# Patient Record
Sex: Male | Born: 1945 | Race: White | Hispanic: No | Marital: Married | Smoking: Never smoker
Health system: Southern US, Community
[De-identification: ages and names within clinical notes are randomized; demographics above are authoritative.]

## PROBLEM LIST (undated history)

## (undated) DIAGNOSIS — K5730 Diverticulosis of large intestine without perforation or abscess without bleeding: Secondary | ICD-10-CM

## (undated) DIAGNOSIS — C8590 Non-Hodgkin lymphoma, unspecified, unspecified site: Secondary | ICD-10-CM

## (undated) DIAGNOSIS — I499 Cardiac arrhythmia, unspecified: Secondary | ICD-10-CM

## (undated) DIAGNOSIS — R0602 Shortness of breath: Secondary | ICD-10-CM

## (undated) DIAGNOSIS — I517 Cardiomegaly: Secondary | ICD-10-CM

## (undated) DIAGNOSIS — Z87442 Personal history of urinary calculi: Secondary | ICD-10-CM

## (undated) DIAGNOSIS — M1990 Unspecified osteoarthritis, unspecified site: Secondary | ICD-10-CM

## (undated) DIAGNOSIS — K219 Gastro-esophageal reflux disease without esophagitis: Secondary | ICD-10-CM

## (undated) DIAGNOSIS — N183 Chronic kidney disease, stage 3 unspecified: Secondary | ICD-10-CM

## (undated) DIAGNOSIS — I509 Heart failure, unspecified: Secondary | ICD-10-CM

## (undated) DIAGNOSIS — K644 Residual hemorrhoidal skin tags: Secondary | ICD-10-CM

## (undated) DIAGNOSIS — E119 Type 2 diabetes mellitus without complications: Secondary | ICD-10-CM

## (undated) DIAGNOSIS — C801 Malignant (primary) neoplasm, unspecified: Secondary | ICD-10-CM

## (undated) DIAGNOSIS — T7840XA Allergy, unspecified, initial encounter: Secondary | ICD-10-CM

## (undated) DIAGNOSIS — T4145XA Adverse effect of unspecified anesthetic, initial encounter: Secondary | ICD-10-CM

## (undated) DIAGNOSIS — J40 Bronchitis, not specified as acute or chronic: Secondary | ICD-10-CM

## (undated) DIAGNOSIS — T8859XA Other complications of anesthesia, initial encounter: Secondary | ICD-10-CM

## (undated) DIAGNOSIS — D689 Coagulation defect, unspecified: Secondary | ICD-10-CM

## (undated) DIAGNOSIS — I4891 Unspecified atrial fibrillation: Secondary | ICD-10-CM

## (undated) DIAGNOSIS — R319 Hematuria, unspecified: Secondary | ICD-10-CM

## (undated) DIAGNOSIS — I10 Essential (primary) hypertension: Secondary | ICD-10-CM

## (undated) DIAGNOSIS — Z86010 Personal history of colonic polyps: Secondary | ICD-10-CM

## (undated) DIAGNOSIS — Z8719 Personal history of other diseases of the digestive system: Secondary | ICD-10-CM

## (undated) HISTORY — PX: HERNIA REPAIR: SHX51

## (undated) HISTORY — PX: COSMETIC SURGERY: SHX468

## (undated) HISTORY — DX: Type 2 diabetes mellitus without complications: E11.9

## (undated) HISTORY — PX: CLEFT PALATE REPAIR: SUR1165

## (undated) HISTORY — PX: CARDIOVERSION: SHX1299

## (undated) HISTORY — PX: LYMPH NODE BIOPSY: SHX201

## (undated) HISTORY — PX: CLEFT LIP REPAIR: SHX5315

## (undated) HISTORY — PX: CHOLECYSTECTOMY: SHX55

## (undated) HISTORY — DX: Allergy, unspecified, initial encounter: T78.40XA

## (undated) HISTORY — PX: OTHER SURGICAL HISTORY: SHX169

## (undated) HISTORY — DX: Coagulation defect, unspecified: D68.9

---

## 2005-08-06 ENCOUNTER — Ambulatory Visit (HOSPITAL_COMMUNITY): Admission: RE | Admit: 2005-08-06 | Discharge: 2005-08-06 | Payer: Self-pay | Admitting: Pulmonary Disease

## 2009-12-22 ENCOUNTER — Ambulatory Visit (HOSPITAL_COMMUNITY): Admission: RE | Admit: 2009-12-22 | Discharge: 2009-12-22 | Payer: Self-pay | Admitting: General Surgery

## 2010-09-24 ENCOUNTER — Ambulatory Visit (HOSPITAL_COMMUNITY)
Admission: RE | Admit: 2010-09-24 | Discharge: 2010-09-24 | Payer: Self-pay | Source: Home / Self Care | Attending: Pulmonary Disease | Admitting: Pulmonary Disease

## 2010-10-27 ENCOUNTER — Ambulatory Visit (INDEPENDENT_AMBULATORY_CARE_PROVIDER_SITE_OTHER): Payer: Managed Care, Other (non HMO) | Admitting: Internal Medicine

## 2010-10-27 DIAGNOSIS — K7689 Other specified diseases of liver: Secondary | ICD-10-CM

## 2010-10-27 DIAGNOSIS — R7401 Elevation of levels of liver transaminase levels: Secondary | ICD-10-CM

## 2010-11-10 NOTE — Consult Note (Signed)
Darius Smith, Darius Smith                 ACCOUNT NO.:  [id number]  MEDICAL RECORD NO.:  [id number]           PATIENT TYPE:  LOCATION:                                 FACILITY:  PHYSICIAN:  Lionel December, M.D.    DATE OF BIRTH:  Nov 27, 1945  DATE OF CONSULTATION: DATE OF DISCHARGE:                                CONSULTATION   REASON FOR CONSULTATION:  Elevated transaminases, fatty liver.  HISTORY OF PRESENT ILLNESS:  Mr. Washko is a referral from Dr. Juanetta Gosling for elevated transaminases and a fatty liver.  He was seen at Dr. Juanetta Gosling' office in December 2011, and noted his ALP was 225, his AST was 211, ALT was 635.  His total bilirubin was 1.5.  His hepatitis B surface antigen was negative.  His hepatitis B core antibody, IgM was negative. His hepatitis A antibody IgM was negative and hepatitis C antibody was negative.  Mr. Sutcliffe denies any abdominal pain.  His appetite is good. He has had no weight loss.  He has a bowel movement every day.  They are normal.  He denies any recent or past jaundice.  He has not been out of country recently.  He does not have any tattoos.  He does give a history of being on Augmentin for sinus infection and was stopped and he stopped the medication 1 week before his blood work was drawn because of age. He states he took approximately 10 Augmentin tablets.  He denies any appetite changes, no melena, or rectal bleeding.  There is no known allergies.  SURGERIES:  He has had a recent right inguinal hernia repair in April 2011.  He has had a repair of a cleft palate.  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  MEDICAL HISTORY:  Includes hypertension.  FAMILY HISTORY:  His mother is alive with breast cancer, terminal, and his father is deceased from prostate cancer.  He has 1 sister with COPD, three brothers, one just recently had an aortic aneurysm repair and two brothers are in good  health.  SOCIAL HISTORY:  He works at [employer].  He does not smoke, drink, or do drugs.  He has 2 children in good health.  HOME MEDICATIONS: 1. Amlodipine/benazepril 5/21 a day. 2. Omeprazole 20 mg a day. 3. Zyrtec 10 mg as needed.  OBJECTIVE:  VITAL SIGNS:  Blood pressure is 190/84, his weight is 197.5, pulse is 80, temp is 97.1. HEENT:  Conjunctivae pink.  His sclerae anicteric. NECK:  His thyroid is normal.  There is no cervical lymphadenopathy. LUNGS:  Clear. HEART:  Regular rate and rhythm. ABDOMEN:  Obese, soft.  Bowel sounds are positive.  No masses felt.  On his labs, his hepatitis A, B, and C markers were negative. Hemoglobin 15.2, hematocrit was 45.7.  He did have a 9 eosinophils, absolute eosinophils of 0.8.  Glucose 107.  Total bilirubin was 1.5, ALP 225, AST 211, ALT 635.  His PSA was 1.41.  ASSESSMENT:  Mr. Stille is a 65 year old male presenting with elevated transaminases and a  probable fatty infiltration of the liver on ultrasound which was done in January 2012.  This increase in his transaminases could be related to the Augmentin he was taken.  I did discuss this case with Dr. Karilyn Cota.  RECOMMENDATIONS:  We will repeat a hepatic function panel only.  We will get a ferritin, iron, and IBC, and a sed rate only.  Further recommendations once we have these reports back and I did discuss this case with Dr. Karilyn Cota.    ______________________________ Dorene Ar, NP   ______________________________ Lionel December, M.D.    TS/MEDQ  D:  10/27/2010  T:  10/28/2010  Job:  [id number]  cc:   Ramon Dredge L. Juanetta Gosling, M.D. Fax: [phone]  Electronically Signed by Dorene Ar PA on 11/09/2010 09:07:41 AM Electronically Signed by Lionel December M.D. on 11/10/2010 02:12:08 PM

## 2010-12-09 LAB — CBC
HCT: 40 % (ref 39.0–52.0)
Hemoglobin: 14.1 g/dL (ref 13.0–17.0)
MCHC: 35.1 g/dL (ref 30.0–36.0)
MCV: 87 fL (ref 78.0–100.0)
Platelets: 272 10*3/uL (ref 150–400)
RBC: 4.6 MIL/uL (ref 4.22–5.81)
RDW: 13 % (ref 11.5–15.5)
WBC: 8.5 10*3/uL (ref 4.0–10.5)

## 2010-12-09 LAB — BASIC METABOLIC PANEL
BUN: 16 mg/dL (ref 6–23)
CO2: 26 mEq/L (ref 19–32)
Calcium: 9 mg/dL (ref 8.4–10.5)
Chloride: 108 mEq/L (ref 96–112)
Creatinine, Ser: 1.03 mg/dL (ref 0.4–1.5)
GFR calc Af Amer: >60 mL/min (ref >60)
GFR calc non Af Amer: >60 mL/min (ref >60)
Glucose, Bld: 126 mg/dL — ABNORMAL HIGH (ref 70–99)
Potassium: 3.6 mEq/L (ref 3.5–5.1)
Sodium: 141 mEq/L (ref 135–145)

## 2011-04-22 ENCOUNTER — Telehealth (INDEPENDENT_AMBULATORY_CARE_PROVIDER_SITE_OTHER): Payer: Self-pay | Admitting: *Deleted

## 2011-04-22 DIAGNOSIS — R945 Abnormal results of liver function studies: Secondary | ICD-10-CM

## 2011-04-22 DIAGNOSIS — R7401 Elevation of levels of liver transaminase levels: Secondary | ICD-10-CM

## 2011-04-22 NOTE — Telephone Encounter (Signed)
Lab order faxed and letter sent to the patient..  

## 2011-04-28 ENCOUNTER — Ambulatory Visit (HOSPITAL_COMMUNITY)
Admission: RE | Admit: 2011-04-28 | Discharge: 2011-04-28 | Disposition: A | Discharge status: Home / Self Care | Payer: Managed Care, Other (non HMO) | Source: Ambulatory Visit | Attending: Internal Medicine | Admitting: Internal Medicine

## 2011-04-28 ENCOUNTER — Other Ambulatory Visit (INDEPENDENT_AMBULATORY_CARE_PROVIDER_SITE_OTHER): Payer: Self-pay | Admitting: Internal Medicine

## 2011-04-28 ENCOUNTER — Encounter (HOSPITAL_COMMUNITY)
Admission: RE | Disposition: A | Discharge status: Home / Self Care | Payer: Self-pay | Source: Ambulatory Visit | Attending: Internal Medicine

## 2011-04-28 ENCOUNTER — Encounter (INDEPENDENT_AMBULATORY_CARE_PROVIDER_SITE_OTHER): Payer: Managed Care, Other (non HMO) | Admitting: Internal Medicine

## 2011-04-28 ENCOUNTER — Encounter (HOSPITAL_COMMUNITY): Payer: Self-pay | Admitting: *Deleted

## 2011-04-28 DIAGNOSIS — D128 Benign neoplasm of rectum: Secondary | ICD-10-CM | POA: Insufficient documentation

## 2011-04-28 DIAGNOSIS — Z1211 Encounter for screening for malignant neoplasm of colon: Secondary | ICD-10-CM | POA: Insufficient documentation

## 2011-04-28 DIAGNOSIS — Z79899 Other long term (current) drug therapy: Secondary | ICD-10-CM | POA: Insufficient documentation

## 2011-04-28 DIAGNOSIS — K5730 Diverticulosis of large intestine without perforation or abscess without bleeding: Secondary | ICD-10-CM

## 2011-04-28 DIAGNOSIS — D126 Benign neoplasm of colon, unspecified: Secondary | ICD-10-CM | POA: Insufficient documentation

## 2011-04-28 DIAGNOSIS — I10 Essential (primary) hypertension: Secondary | ICD-10-CM | POA: Insufficient documentation

## 2011-04-28 HISTORY — PX: COLONOSCOPY: SHX5424

## 2011-04-28 HISTORY — DX: Essential (primary) hypertension: I10

## 2011-04-28 HISTORY — DX: Unspecified osteoarthritis, unspecified site: M19.90

## 2011-04-28 HISTORY — DX: Shortness of breath: R06.02

## 2011-04-28 HISTORY — DX: Gastro-esophageal reflux disease without esophagitis: K21.9

## 2011-04-28 SURGERY — COLONOSCOPY
Anesthesia: Moderate Sedation

## 2011-04-28 MED ORDER — SODIUM CHLORIDE 0.45 % IV SOLN
Freq: Once | INTRAVENOUS | Status: AC
  Administered 2011-04-28: 08:00:00 via INTRAVENOUS

## 2011-04-28 MED ORDER — MEPERIDINE HCL 50 MG/ML IJ SOLN
INTRAMUSCULAR | Status: AC
  Filled 2011-04-28: qty 1

## 2011-04-28 MED ORDER — STERILE WATER FOR IRRIGATION IR SOLN
Status: DC | PRN
  Administered 2011-04-28: 09:00:00

## 2011-04-28 MED ORDER — MIDAZOLAM HCL 5 MG/5ML IJ SOLN
INTRAMUSCULAR | Status: DC | PRN
  Administered 2011-04-28: 1 mg via INTRAVENOUS
  Administered 2011-04-28 (×2): 2 mg via INTRAVENOUS

## 2011-04-28 MED ORDER — MEPERIDINE HCL 50 MG/ML IJ SOLN
INTRAMUSCULAR | Status: DC | PRN
  Administered 2011-04-28 (×2): 25 mg via INTRAVENOUS

## 2011-04-28 MED ORDER — MIDAZOLAM HCL 5 MG/5ML IJ SOLN
INTRAMUSCULAR | Status: AC
  Filled 2011-04-28: qty 10

## 2011-04-28 NOTE — Op Note (Signed)
COLONOSCOPY PROCEDURE REPORT  PATIENT:  Darius Smith  MR#:  [id number] Birthdate:  1946-09-01, 65 y.o., male Endoscopist:  Dr. Malissa Hippo, MD Referred By:  Dr. Fredirick Maudlin. Procedure Date: 04/28/2011  Procedure:   Colonoscopy.  Indications:  Average risk screening colonoscopy.  Informed Consent: Procedure and risks were reviewed with the patient; his questions were answered and informed consent was obtained.   Medications:  Demerol 50  mg IV Versed 5 mg IV  Description of procedure:  After a digital rectal exam was performed, that colonoscope was advanced from the anus through the rectum and colon to the area of the cecum, ileocecal valve and appendiceal orifice. The cecum was deeply intubated. These structures were well-seen and photographed for the record. From the level of the cecum and ileocecal valve, the scope was slowly and cautiously withdrawn. The mucosal surfaces were carefully surveyed utilizing scope tip to flexion to facilitate fold flattening as needed. The scope was pulled down into the rectum where a thorough exam including retroflexion was performed.  Findings:   Preparation is satisfactory. 12 mm sessile polyp at ileocecal valve with irregular shape saline-assisted polypectomy performed residual polyp coagulated using snare tip. Polypectomy complete. 6 mm sessile polyp transverse colon partly snared and partly correlated saline injection. 10 mm broad-based made from mid sigmoid colon. Millimeter polyp snared from the rectum 3 small polyps at sigmoid colon were coagulated using snare tip. Multiple diverticula at sigmoid colon and few at descending.  Therapeutic/Diagnostic Maneuvers Performed:  See findings.  Complications:  None  Cecal Withdrawal Time:  32 minutes  Impression:  4 polyps snared as above. three polyps coagulated as above. Multiple diverticula sigmoid colon and few at descending colon   Recommendations:  No aspirin for 10 days Resume  usual medications High fiber diet No driving for 24 hours. Physician will contact you with biopsy result.  Anzal Bartnick U  04/28/2011 9:16 AM  CC: Dr. Fredirick Maudlin, MD

## 2011-04-28 NOTE — Brief Op Note (Signed)
bp 121/81 on arrival to post op.

## 2011-04-28 NOTE — H&P (Signed)
Darius Smith is an 65 y.o. male.   Chief Complaint: For colonoscopy HPI: Patient is 65 year old Caucasian male who is here for average risk screening colonoscopy. This is his first exam. He denies abdominal pain change in his bowel habits or rectal bleeding.  Past Medical History  Diagnosis Date  . Shortness of breath   . Hypertension   . GERD (gastroesophageal reflux disease)   . Arthritis     Past Surgical History  Procedure Date  . Hernia repair   . Cholecystectomy   . Vocal cord surgery     History reviewed. No pertinent family history. Social History:  reports that he has never smoked. He does not have any smokeless tobacco history on file. He reports that he drinks about 4.2 ounces of alcohol per week. He reports that he does not use illicit drugs.  Allergies:  Allergies  Allergen Reactions  . Augmentin Other (See Comments)    Increased liver enzymes    Medications Prior to Admission  Medication Dose Route Frequency Provider Last Rate Last Dose  . 0.45 % sodium chloride infusion   Intravenous Once Malissa Hippo, MD 20 mL/hr at 04/28/11 0805    . meperidine (DEMEROL) 50 MG/ML injection           . midazolam (VERSED) 5 MG/5ML injection            Medications Prior to Admission  Medication Sig Dispense Refill  . amLODipine-benazepril (LOTREL) 5-20 MG per capsule Take 1 capsule by mouth daily.        Marland Kitchen omeprazole (PRILOSEC) 20 MG capsule Take 20 mg by mouth daily.          No results found for this or any previous visit (from the past 48 hour(s)). No results found.  Review of Systems  Constitutional: Negative for weight loss.  Gastrointestinal: Negative for heartburn, nausea, vomiting, abdominal pain, diarrhea, constipation, blood in stool and melena.    Blood pressure 185/109, pulse 73, temperature 98.1 F (36.7 C), temperature source Oral, resp. rate 18, height 6' (1.829 m), weight 194 lb (87.998 kg), SpO2 98.00%. Physical Exam  Constitutional: He appears  well-developed and well-nourished.  HENT:  Mouth/Throat: Oropharynx is clear and moist.       Large tonsils  Eyes: Conjunctivae are normal. No scleral icterus.  Neck: Neck supple. No thyromegaly present.  Cardiovascular: Normal rate, regular rhythm and normal heart sounds.   No murmur heard. Respiratory: Breath sounds normal.  GI: Soft. He exhibits no mass. There is no tenderness. There is no rebound and no guarding.  Musculoskeletal: He exhibits no edema.  Lymphadenopathy:    He has no cervical adenopathy.  Neurological: He is alert.  Skin: Skin is warm and dry.     Assessment/Plan Average risk screening colonoscopy. Procedure and risks  reviewed and he  is agreeable  Linnell Swords U 04/28/2011, 8:27 AM

## 2011-04-30 LAB — HEPATIC FUNCTION PANEL
ALT: 23 U/L (ref 0–53)
AST: 18 U/L (ref 0–37)
Albumin: 4.1 g/dL (ref 3.5–5.2)
Alkaline Phosphatase: 77 U/L (ref 39–117)
Bilirubin, Direct: 0.2 mg/dL (ref 0.0–0.3)
Indirect Bilirubin: 0.6 mg/dL (ref 0.0–0.9)
Total Bilirubin: 0.8 mg/dL (ref 0.3–1.2)
Total Protein: 7 g/dL (ref 6.0–8.3)

## 2011-05-03 ENCOUNTER — Telehealth (INDEPENDENT_AMBULATORY_CARE_PROVIDER_SITE_OTHER): Payer: Self-pay | Admitting: Internal Medicine

## 2011-05-03 NOTE — Telephone Encounter (Signed)
Results given to wife. All lab work was normal.

## 2011-05-04 ENCOUNTER — Encounter (INDEPENDENT_AMBULATORY_CARE_PROVIDER_SITE_OTHER): Payer: Self-pay | Admitting: *Deleted

## 2011-05-05 ENCOUNTER — Encounter (HOSPITAL_COMMUNITY): Payer: Self-pay | Admitting: Internal Medicine

## 2014-04-24 ENCOUNTER — Encounter (INDEPENDENT_AMBULATORY_CARE_PROVIDER_SITE_OTHER): Payer: Self-pay | Admitting: *Deleted

## 2014-05-08 ENCOUNTER — Other Ambulatory Visit (INDEPENDENT_AMBULATORY_CARE_PROVIDER_SITE_OTHER): Payer: Self-pay | Admitting: *Deleted

## 2014-05-08 DIAGNOSIS — Z86010 Personal history of colonic polyps: Secondary | ICD-10-CM

## 2014-05-22 ENCOUNTER — Telehealth (INDEPENDENT_AMBULATORY_CARE_PROVIDER_SITE_OTHER): Payer: Self-pay | Admitting: *Deleted

## 2014-05-22 DIAGNOSIS — Z1211 Encounter for screening for malignant neoplasm of colon: Secondary | ICD-10-CM

## 2014-05-22 NOTE — Telephone Encounter (Signed)
Patient needs trilyte 

## 2014-05-24 MED ORDER — PEG 3350-KCL-NA BICARB-NACL 420 G PO SOLR: 4000.0000 mL | Freq: Once | ORAL | Status: DC

## 2014-06-19 ENCOUNTER — Telehealth (INDEPENDENT_AMBULATORY_CARE_PROVIDER_SITE_OTHER): Payer: Self-pay | Admitting: *Deleted

## 2014-06-19 NOTE — Telephone Encounter (Signed)
Referring MD/PCP: hawkins   Procedure: tcs  Reason/Indication:  Hx polyps  Has patient had this procedure before?  Yes, 2012 -- epic  If so, when, by whom and where?    Is there a family history of colon cancer?  no  Who?  What age when diagnosed?    Is patient diabetic?   no      Does patient have prosthetic heart valve?  no  Do you have a pacemaker?  no  Has patient ever had endocarditis? no  Has patient had joint replacement within last 12 months?  no  Does patient tend to be constipated or take laxatives? no  Is patient on Coumadin, Plavix and/or Aspirin? no  Medications: omeprazole 20 mg daily, amlodipine 5/40 mg daily, centrum silver  Allergies: augmentin  Medication Adjustment:   Procedure date & time: 07/18/14 at 830

## 2014-06-24 NOTE — Telephone Encounter (Signed)
agree

## 2014-07-01 ENCOUNTER — Encounter (HOSPITAL_COMMUNITY): Payer: Self-pay | Admitting: Pharmacy Technician

## 2014-07-18 ENCOUNTER — Encounter (HOSPITAL_COMMUNITY): Payer: Self-pay | Admitting: *Deleted

## 2014-07-18 ENCOUNTER — Encounter (HOSPITAL_COMMUNITY)
Admission: RE | Disposition: A | Discharge status: Home / Self Care | Payer: Self-pay | Source: Ambulatory Visit | Attending: Internal Medicine

## 2014-07-18 ENCOUNTER — Ambulatory Visit (HOSPITAL_COMMUNITY)
Admission: RE | Admit: 2014-07-18 | Discharge: 2014-07-18 | Disposition: A | Discharge status: Home / Self Care | Payer: Medicare Other | Source: Ambulatory Visit | Attending: Internal Medicine | Admitting: Internal Medicine

## 2014-07-18 DIAGNOSIS — D120 Benign neoplasm of cecum: Secondary | ICD-10-CM | POA: Diagnosis not present

## 2014-07-18 DIAGNOSIS — Z1211 Encounter for screening for malignant neoplasm of colon: Secondary | ICD-10-CM | POA: Insufficient documentation

## 2014-07-18 DIAGNOSIS — Z86010 Personal history of colonic polyps: Secondary | ICD-10-CM

## 2014-07-18 DIAGNOSIS — K649 Unspecified hemorrhoids: Secondary | ICD-10-CM

## 2014-07-18 DIAGNOSIS — I10 Essential (primary) hypertension: Secondary | ICD-10-CM | POA: Diagnosis not present

## 2014-07-18 DIAGNOSIS — K5730 Diverticulosis of large intestine without perforation or abscess without bleeding: Secondary | ICD-10-CM | POA: Diagnosis not present

## 2014-07-18 DIAGNOSIS — Z79899 Other long term (current) drug therapy: Secondary | ICD-10-CM | POA: Insufficient documentation

## 2014-07-18 DIAGNOSIS — D123 Benign neoplasm of transverse colon: Secondary | ICD-10-CM | POA: Insufficient documentation

## 2014-07-18 DIAGNOSIS — K219 Gastro-esophageal reflux disease without esophagitis: Secondary | ICD-10-CM | POA: Diagnosis not present

## 2014-07-18 DIAGNOSIS — K644 Residual hemorrhoidal skin tags: Secondary | ICD-10-CM | POA: Insufficient documentation

## 2014-07-18 DIAGNOSIS — D122 Benign neoplasm of ascending colon: Secondary | ICD-10-CM | POA: Insufficient documentation

## 2014-07-18 HISTORY — PX: COLONOSCOPY: SHX5424

## 2014-07-18 SURGERY — COLONOSCOPY
Anesthesia: Moderate Sedation

## 2014-07-18 MED ORDER — SODIUM CHLORIDE 0.9 % IV SOLN
INTRAVENOUS | Status: DC
  Administered 2014-07-18: 1000 mL via INTRAVENOUS

## 2014-07-18 MED ORDER — STERILE WATER FOR IRRIGATION IR SOLN
Status: DC | PRN
  Administered 2014-07-18: 08:00:00

## 2014-07-18 MED ORDER — MIDAZOLAM HCL 5 MG/5ML IJ SOLN
INTRAMUSCULAR | Status: AC
  Filled 2014-07-18: qty 10

## 2014-07-18 MED ORDER — MEPERIDINE HCL 50 MG/ML IJ SOLN
INTRAMUSCULAR | Status: DC | PRN
  Administered 2014-07-18 (×2): 25 mg via INTRAVENOUS

## 2014-07-18 MED ORDER — MEPERIDINE HCL 50 MG/ML IJ SOLN
INTRAMUSCULAR | Status: AC
  Filled 2014-07-18: qty 1

## 2014-07-18 MED ORDER — MIDAZOLAM HCL 5 MG/5ML IJ SOLN
INTRAMUSCULAR | Status: DC | PRN
  Administered 2014-07-18: 1 mg via INTRAVENOUS
  Administered 2014-07-18 (×2): 2 mg via INTRAVENOUS

## 2014-07-18 NOTE — Op Note (Signed)
COLONOSCOPY PROCEDURE REPORT  PATIENT:  Darius Smith  MR#:  [id number] Birthdate:  [birth year], 68 y.o., male Endoscopist:  Dr. Rogene Houston, MD Referred By:  Dr. Alonza Bogus, MD  Procedure Date: 07/18/2014  Procedure:   Colonoscopy  Indications:  Patient is 68 year old Caucasian male with history of colonic adenomas who is here for surveillance colonoscopy. Last exam was in August 2012.  Informed Consent:  The procedure and risks were reviewed with the patient and informed consent was obtained.  Medications:  Demerol 50 mg IV Versed 5 mg IV  Description of procedure:  After a digital rectal exam was performed, that colonoscope was advanced from the anus through the rectum and colon to the area of the cecum, ileocecal valve and appendiceal orifice. The cecum was deeply intubated. These structures were well-seen and photographed for the record. From the level of the cecum and ileocecal valve, the scope was slowly and cautiously withdrawn. The mucosal surfaces were carefully surveyed utilizing scope tip to flexion to facilitate fold flattening as needed. The scope was pulled down into the rectum where a thorough exam including retroflexion was performed.  Findings: Prep satisfactory. 3 small polyps were ablated while cold biopsy and submitted together. These are located at cecum, ascending and proximal transverse colon. 5 mm polyp was cold snared from distal transverse colon. 7 mm polyp was hot snared from distal transverse colon. Both polyps from distal transverse colon were submitted together. Multiple diverticula and sigmoid colon with few more at descending colon. Normal rectal mucosa. Prominent hemorrhoids below the dentate line.  Therapeutic/Diagnostic Maneuvers Performed:  See above  Complications:  None  Cecal Withdrawal Time:  23 minutes  Impression:  Examination performed to cecum. Three small polyps or ablated via cold biopsy and submitted together(cecum,  ascending colon and proximal transverse colon). 5 mm polyp was cold snared from distal transverse colon. 7 mm polyp was hot snare from distal transverse colon and submitted with the other polyp removed from distal transverse colon. Left-sided diverticulosis. External hemorrhoids  Recommendations:  Standard instructions given. No aspirin or NSAIDs for 1 week. I will contact patient with biopsy results and further recommendations.  [name]  07/18/2014 8:59 AM  CC: Dr. Alonza Bogus, MD & Dr. Rayne Du ref. provider found

## 2014-07-18 NOTE — H&P (Addendum)
Darius Smith is an 68 y.o. male.   Chief Complaint: Patient is here for colonoscopy. HPI: Patient is 68 year old Caucasian male was here for surveillance colonoscopy. He has history of colonic adenomas. 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 One polyp was 12 mm. He denies abdominal pain change in bowel habits or rectal bleeding. Family history is negative for CRC.     Past Medical History  Diagnosis Date  . Shortness of breath   . Hypertension   . GERD (gastroesophageal reflux disease)   . Arthritis     Past Surgical History  Procedure Laterality Date  . Hernia repair    . Cholecystectomy    . Vocal cord surgery    . Colonoscopy  04/28/2011    Procedure: COLONOSCOPY;  Surgeon: Rogene Houston, MD;  Location: [facility];  Service: Endoscopy;  Laterality: N/A;    History reviewed. No pertinent family history. Social History:  reports that he has never smoked. He does not have any smokeless tobacco history on file. He reports that he drinks about 4.2 ounces of alcohol per week. He reports that he does not use illicit drugs.  Allergies:  Allergies  Allergen Reactions  . Amoxicillin-Pot Clavulanate Other (See Comments)    Increased liver enzymes    Medications Prior to Admission  Medication Sig Dispense Refill  . amLODipine-benazepril (LOTREL) 5-20 MG per capsule Take 1 capsule by mouth daily.        Marland Kitchen omeprazole (PRILOSEC) 20 MG capsule Take 20 mg by mouth daily.        . polyethylene glycol-electrolytes (TRILYTE) 420 G solution Take 4,000 mLs by mouth once.  4000 mL  0    No results found for this or any previous visit (from the past 48 hour(s)). No results found.  ROS  Blood pressure 170/97, pulse 94, temperature 97.7 F (36.5 C), temperature source Oral, resp. rate 18, height 6' (1.829 m), weight 195 lb (88.451 kg), SpO2 95.00%. Physical Exam  Constitutional: He appears well-developed and well-nourished.  HENT:   Mouth/Throat: Oropharynx is clear and moist.  Eyes: Conjunctivae are normal. No scleral icterus.  Neck: No thyromegaly present.  Cardiovascular: Normal rate, regular rhythm and normal heart sounds.   No murmur heard. Respiratory: Effort normal and breath sounds normal.  GI: Soft. He exhibits no distension and no mass. There is no tenderness.  Small umbilical hernia which is completely reducible.  Musculoskeletal: He exhibits no edema.  Lymphadenopathy:    He has no cervical adenopathy.  Neurological: He is alert.  Skin: Skin is warm and dry.     Assessment/Plan History of colonic adenomas. Surveillance colonoscopy.   Verdis Koval U 07/18/2014, 8:18 AM

## 2014-07-18 NOTE — Discharge Instructions (Signed)
No aspirin or NSAIDs for 1 week. Resume usual medications. High fiber diet. No driving for 24 hours. Physician will call with biopsy results.  Colonoscopy, Care After Refer to this sheet in the next few weeks. These instructions provide you with information on caring for yourself after your procedure. Your health care provider may also give you more specific instructions. Your treatment has been planned according to current medical practices, but problems sometimes occur. Call your health care provider if you have any problems or questions after your procedure. WHAT TO EXPECT AFTER THE PROCEDURE  After your procedure, it is typical to have the following:  A small amount of blood in your stool.  Moderate amounts of gas and mild abdominal cramping or bloating. HOME CARE INSTRUCTIONS  Do not drive, operate machinery, or sign important documents for 24 hours.  You may shower and resume your regular physical activities, but move at a slower pace for the first 24 hours.  Take frequent rest periods for the first 24 hours.  Walk around or put a warm pack on your abdomen to help reduce abdominal cramping and bloating.  Drink enough fluids to keep your urine clear or pale yellow.  You may resume your normal diet as instructed by your health care provider. Avoid heavy or fried foods that are hard to digest.  Avoid drinking alcohol for 24 hours or as instructed by your health care provider.  Only take over-the-counter or prescription medicines as directed by your health care provider.  If a tissue sample (biopsy) was taken during your procedure:  Do not take aspirin or blood thinners for 7 days, or as instructed by your health care provider.  Do not drink alcohol for 7 days, or as instructed by your health care provider.  Eat soft foods for the first 24 hours. SEEK MEDICAL CARE IF: You have persistent spotting of blood in your stool 2-3 days after the procedure. SEEK IMMEDIATE MEDICAL  CARE IF:  You have more than a small spotting of blood in your stool.  You pass large blood clots in your stool.  Your abdomen is swollen (distended).  You have nausea or vomiting.  You have a fever.  You have increasing abdominal pain that is not relieved with medicine. Document Released: 04/20/2004 Document Revised: 06/27/2013 Document Reviewed: 05/14/2013 [facility] Patient Information 2015 [place], [place]. This information is not intended to replace advice given to you by your health care provider. Make sure you discuss any questions you have with your health care provider.  High-Fiber Diet Fiber is found in fruits, vegetables, and grains. A high-fiber diet encourages the addition of more whole grains, legumes, fruits, and vegetables in your diet. The recommended amount of fiber for adult males is 38 g per day. For adult females, it is 25 g per day. Pregnant and lactating women should get 28 g of fiber per day. If you have a digestive or bowel problem, ask your caregiver for advice before adding high-fiber foods to your diet. Eat a variety of high-fiber foods instead of only a select few type of foods.  PURPOSE  To increase stool bulk.  To make bowel movements more regular to prevent constipation.  To lower cholesterol.  To prevent overeating. WHEN IS THIS DIET USED?  It may be used if you have constipation and hemorrhoids.  It may be used if you have uncomplicated diverticulosis (intestine condition) and irritable bowel syndrome.  It may be used if you need help with weight management.  It may  be used if you want to add it to your diet as a protective measure against atherosclerosis, diabetes, and cancer. SOURCES OF FIBER  Whole-grain breads and cereals.  Fruits, such as apples, oranges, bananas, berries, prunes, and pears.  Vegetables, such as green peas, carrots, sweet potatoes, beets, broccoli, cabbage, spinach, and artichokes.  Legumes, such split peas, soy,  lentils.  Almonds. FIBER CONTENT IN FOODS Starches and Grains / Dietary Fiber (g)  Cheerios, 1 cup / 3 g  Corn Flakes cereal, 1 cup / 0.7 g  Rice crispy treat cereal, 1 cup / 0.3 g  Instant oatmeal (cooked),  cup / 2 g  Frosted wheat cereal, 1 cup / 5.1 g  Brown, long-grain rice (cooked), 1 cup / 3.5 g  White, long-grain rice (cooked), 1 cup / 0.6 g  Enriched macaroni (cooked), 1 cup / 2.5 g Legumes / Dietary Fiber (g)  Baked beans (canned, plain, or vegetarian),  cup / 5.2 g  Kidney beans (canned),  cup / 6.8 g  Pinto beans (cooked),  cup / 5.5 g Breads and Crackers / Dietary Fiber (g)  Plain or honey graham crackers, 2 squares / 0.7 g  Saltine crackers, 3 squares / 0.3 g  Plain, salted pretzels, 10 pieces / 1.8 g  Whole-wheat bread, 1 slice / 1.9 g  White bread, 1 slice / 0.7 g  Raisin bread, 1 slice / 1.2 g  Plain bagel, 3 oz / 2 g  Flour tortilla, 1 oz / 0.9 g  Corn tortilla, 1 small / 1.5 g  Hamburger or hotdog bun, 1 small / 0.9 g Fruits / Dietary Fiber (g)  Apple with skin, 1 medium / 4.4 g  Sweetened applesauce,  cup / 1.5 g  Banana,  medium / 1.5 g  Grapes, 10 grapes / 0.4 g  Orange, 1 small / 2.3 g  Raisin, 1.5 oz / 1.6 g  Melon, 1 cup / 1.4 g Vegetables / Dietary Fiber (g)  Green beans (canned),  cup / 1.3 g  Carrots (cooked),  cup / 2.3 g  Broccoli (cooked),  cup / 2.8 g  Peas (cooked),  cup / 4.4 g  Mashed potatoes,  cup / 1.6 g  Lettuce, 1 cup / 0.5 g  Corn (canned),  cup / 1.6 g  Tomato,  cup / 1.1 g Document Released: 09/06/2005 Document Revised: 03/07/2012 Document Reviewed: 12/09/2011 [employer] Patient Information 2015 [place], [place]. This information is not intended to replace advice given to you by your health care provider. Make sure you discuss any questions you have with your health care provider.   Colon Polyps Polyps are lumps of extra tissue growing inside the body. Polyps can grow in the  large intestine (colon). Most colon polyps are noncancerous (benign). However, some colon polyps can become cancerous over time. Polyps that are larger than a pea may be harmful. To be safe, caregivers remove and test all polyps. CAUSES  Polyps form when mutations in the genes cause your cells to grow and divide even though no more tissue is needed. RISK FACTORS There are a number of risk factors that can increase your chances of getting colon polyps. They include:  Being older than 50 years.  Family history of colon polyps or colon cancer.  Long-term colon diseases, such as colitis or Crohn disease.  Being overweight.  Smoking.  Being inactive.  Drinking too much alcohol. SYMPTOMS  Most small polyps do not cause symptoms. If symptoms are present, they may include:  Blood in the stool. The stool may look dark red or black.  Constipation or diarrhea that lasts longer than 1 week. DIAGNOSIS People often do not know they have polyps until their caregiver finds them during a regular checkup. Your caregiver can use 4 tests to check for polyps:  Digital rectal exam. The caregiver wears gloves and feels inside the rectum. This test would find polyps only in the rectum.  Barium enema. The caregiver puts a liquid called barium into your rectum before taking X-rays of your colon. Barium makes your colon look white. Polyps are dark, so they are easy to see in the X-ray pictures.  Sigmoidoscopy. A thin, flexible tube (sigmoidoscope) is placed into your rectum. The sigmoidoscope has a light and tiny camera in it. The caregiver uses the sigmoidoscope to look at the last third of your colon.  Colonoscopy. This test is like sigmoidoscopy, but the caregiver looks at the entire colon. This is the most common method for finding and removing polyps. TREATMENT  Any polyps will be removed during a sigmoidoscopy or colonoscopy. The polyps are then tested for cancer. PREVENTION  To help lower your risk  of getting more colon polyps:  Eat plenty of fruits and vegetables. Avoid eating fatty foods.  Do not smoke.  Avoid drinking alcohol.  Exercise every day.  Lose weight if recommended by your caregiver.  Eat plenty of calcium and folate. Foods that are rich in calcium include milk, cheese, and broccoli. Foods that are rich in folate include chickpeas, kidney beans, and spinach. HOME CARE INSTRUCTIONS Keep all follow-up appointments as directed by your caregiver. You may need periodic exams to check for polyps. SEEK MEDICAL CARE IF: You notice bleeding during a bowel movement. Document Released: 06/02/2004 Document Revised: 11/29/2011 Document Reviewed: 11/16/2011 [facility] Patient Information 2015 [place], [place]. This information is not intended to replace advice given to you by your health care provider. Make sure you discuss any questions you have with your health care provider.

## 2014-07-19 ENCOUNTER — Encounter (HOSPITAL_COMMUNITY): Payer: Self-pay | Admitting: Internal Medicine

## 2014-08-06 ENCOUNTER — Encounter (HOSPITAL_COMMUNITY): Payer: Self-pay | Admitting: Emergency Medicine

## 2014-08-06 ENCOUNTER — Emergency Department (HOSPITAL_COMMUNITY)
Admission: EM | Admit: 2014-08-06 | Discharge: 2014-08-06 | Disposition: A | Discharge status: Home / Self Care | Payer: Medicare Other | Attending: Emergency Medicine | Admitting: Emergency Medicine

## 2014-08-06 DIAGNOSIS — Z8739 Personal history of other diseases of the musculoskeletal system and connective tissue: Secondary | ICD-10-CM | POA: Diagnosis not present

## 2014-08-06 DIAGNOSIS — R0989 Other specified symptoms and signs involving the circulatory and respiratory systems: Secondary | ICD-10-CM | POA: Insufficient documentation

## 2014-08-06 DIAGNOSIS — K219 Gastro-esophageal reflux disease without esophagitis: Secondary | ICD-10-CM | POA: Insufficient documentation

## 2014-08-06 DIAGNOSIS — R1110 Vomiting, unspecified: Secondary | ICD-10-CM | POA: Insufficient documentation

## 2014-08-06 DIAGNOSIS — Z79899 Other long term (current) drug therapy: Secondary | ICD-10-CM | POA: Diagnosis not present

## 2014-08-06 DIAGNOSIS — I10 Essential (primary) hypertension: Secondary | ICD-10-CM | POA: Insufficient documentation

## 2014-08-06 DIAGNOSIS — R55 Syncope and collapse: Secondary | ICD-10-CM | POA: Diagnosis not present

## 2014-08-06 LAB — CBC WITH DIFFERENTIAL/PLATELET
Basophils Absolute: 0.1 10*3/uL (ref 0.0–0.1)
Basophils Relative: 1 % (ref 0–1)
Eosinophils Absolute: 0.4 10*3/uL (ref 0.0–0.7)
Eosinophils Relative: 4 % (ref 0–5)
HCT: 44 % (ref 39.0–52.0)
Hemoglobin: 15 g/dL (ref 13.0–17.0)
Lymphocytes Relative: 23 % (ref 12–46)
Lymphs Abs: 2.3 10*3/uL (ref 0.7–4.0)
MCH: 29.8 pg (ref 26.0–34.0)
MCHC: 34.1 g/dL (ref 30.0–36.0)
MCV: 87.5 fL (ref 78.0–100.0)
Monocytes Absolute: 0.8 10*3/uL (ref 0.1–1.0)
Monocytes Relative: 8 % (ref 3–12)
Neutro Abs: 6.6 10*3/uL (ref 1.7–7.7)
Neutrophils Relative %: 64 % (ref 43–77)
Platelets: 279 10*3/uL (ref 150–400)
RBC: 5.03 MIL/uL (ref 4.22–5.81)
RDW: 12.5 % (ref 11.5–15.5)
Smear Review: ADEQUATE
WBC: 10.2 10*3/uL (ref 4.0–10.5)

## 2014-08-06 LAB — BASIC METABOLIC PANEL
Anion gap: 17 — ABNORMAL HIGH (ref 5–15)
BUN: 19 mg/dL (ref 6–23)
CO2: 21 mEq/L (ref 19–32)
Calcium: 9 mg/dL (ref 8.4–10.5)
Chloride: 102 mEq/L (ref 96–112)
Creatinine, Ser: 1.26 mg/dL (ref 0.50–1.35)
GFR calc Af Amer: 66 mL/min — ABNORMAL LOW (ref >90)
GFR calc non Af Amer: 57 mL/min — ABNORMAL LOW (ref >90)
Glucose, Bld: 116 mg/dL — ABNORMAL HIGH (ref 70–99)
Potassium: 3.7 mEq/L (ref 3.7–5.3)
Sodium: 140 mEq/L (ref 137–147)

## 2014-08-06 NOTE — ED Notes (Signed)
Patient and family verbalize understanding of discharge instructions, home care, and follow up care. Patient ambulatory out of department at this time with spouse

## 2014-08-06 NOTE — ED Notes (Signed)
PT reports getting choked on a piece of porkchop this pm and wife reports episode of syncope and then vomiting x1. Wife reports only a short period of LOC. PT denies any SOB or oral swelling at this time. PT reports feeling clammy.

## 2014-08-06 NOTE — ED Provider Notes (Signed)
CSN: [id number]     Arrival date & time 08/06/14  1817 History   This chart was scribed for Dorie Rank, MD by Lowella Petties, ED Scribe. The patient was seen in room APA18/[facility]18. Patient's care was started at 6:55 PM.   Chief Complaint  Patient presents with  . Loss of Consciousness   The history is provided by the patient. No language interpreter was used.   HPI Comments: Darius Smith is a 68 y.o. male with a history of abdominal hernia who presents to the Emergency Department complaining of a loss of consciousness earlier today while he was eating dinner. He states that this occurred when he was eating and he began to choke on his food, cough, and gag. His wife states that his teeth were clamped together so she could not remove the food, and she state that he was too heavy for her to perform the heimlich menouver. He reports that after he awoke, he coughed up liquid and had one episode of vomiting. He denies feeling like there is a food stuck in his throat, and he denies difficulty swallowing.  Past Medical History  Diagnosis Date  . Shortness of breath   . Hypertension   . GERD (gastroesophageal reflux disease)   . Arthritis   . Abdominal hernia    Past Surgical History  Procedure Laterality Date  . Hernia repair    . Cholecystectomy    . Vocal cord surgery    . Colonoscopy  04/28/2011    Procedure: COLONOSCOPY;  Surgeon: Rogene Houston, MD;  Location: [facility];  Service: Endoscopy;  Laterality: N/A;  . Colonoscopy N/A 07/18/2014    Procedure: COLONOSCOPY;  Surgeon: Rogene Houston, MD;  Location: [facility];  Service: Endoscopy;  Laterality: N/A;  830   No family history on file. History  Substance Use Topics  . Smoking status: Never Smoker   . Smokeless tobacco: Not on file  . Alcohol Use: 4.2 oz/week    7 Cans of beer per week    Review of Systems A complete 10 system review of systems was obtained and all systems are negative except as noted in the HPI and PMH.    Allergies  Augmentin  Home Medications   Prior to Admission medications   Medication Sig Start Date End Date Taking? Authorizing Provider  amLODipine-benazepril (LOTREL) 5-20 MG per capsule Take 1 capsule by mouth daily.      Historical Provider, MD  omeprazole (PRILOSEC) 20 MG capsule Take 20 mg by mouth daily.      Historical Provider, MD   Triage Vitals: BP 157/87 mmHg  Pulse 93  Temp(Src) 98.3 F (36.8 C) (Oral)  Resp 18  Ht 6' (1.829 m)  Wt 195 lb (88.451 kg)  BMI 26.44 kg/m2  SpO2 100% Physical Exam  Constitutional: He appears well-developed and well-nourished. No distress.  HENT:  Head: Normocephalic and atraumatic.  Right Ear: External ear normal.  Left Ear: External ear normal.  Eyes: Conjunctivae are normal. Right eye exhibits no discharge. Left eye exhibits no discharge. No scleral icterus.  Neck: Neck supple. No tracheal deviation present.  Cardiovascular: Normal rate, regular rhythm and intact distal pulses.   Pulmonary/Chest: Effort normal and breath sounds normal. No stridor. No respiratory distress. He has no wheezes. He has no rales.  Abdominal: Soft. Bowel sounds are normal. He exhibits no distension. There is no tenderness. There is no rebound and no guarding.  Musculoskeletal: He exhibits no edema or tenderness.  Neurological: He is alert. He has normal strength. No cranial nerve deficit (no facial droop, extraocular movements intact, no slurred speech) or sensory deficit. He exhibits normal muscle tone. He displays no seizure activity. Coordination normal.  Skin: Skin is warm and dry. No rash noted.  Psychiatric: He has a normal mood and affect.  Nursing note and vitals reviewed.   ED Course  Procedures (including critical care time) DIAGNOSTIC STUDIES: Oxygen Saturation is 100% on room air, normal by my interpretation.    COORDINATION OF CARE: 6:59 PM-Discussed treatment plan which includes EKG and lab work with pt at bedside and pt agreed to plan.    Labs Review Labs Reviewed  BASIC METABOLIC PANEL - Abnormal; Notable for the following:    Glucose, Bld 116 (*)    GFR calc non Af Amer 57 (*)    GFR calc Af Amer 66 (*)    Anion gap 17 (*)    All other components within normal limits  CBC WITH DIFFERENTIAL    Imaging Review No results found.   EKG Interpretation   Date/Time:  Tuesday August 06 2014 18:36:03 EST Ventricular Rate:  90 PR Interval:  151 QRS Duration: 101 QT Interval:  481 QTC Calculation: 589 R Axis:   41 Text Interpretation:  Sinus rhythm Borderline T abnormalities, diffuse  leads Prolonged QT interval No significant change since last tracing  Confirmed by Alasdair Kleve  MD-J, Wenzel Backlund ([id number]) on 08/06/2014 7:07:52 PM      MDM   Final diagnoses:  Choking episode  Syncope, unspecified syncope type   The patient was asymptomatic in the emergency department.  he was able to drink liquids without any difficulty.patient does have history of esophageal stricture in the past. He does not have any symptoms to suggest a persistent food impaction at this time. I did recommend the patient follow up with his GI doctor to see if he is developing a recurrent stricture.  Patient's choking episode was the cause of his syncopal episode earlier today. At this point again is asymptomatic and has been monitored in the emergency department. He is stable for discharge  I personally performed the services described in this documentation, which was scribed in my presence.  The recorded information has been reviewed and is accurate.    Dorie Rank, MD 08/06/14 2117

## 2014-08-06 NOTE — Discharge Instructions (Signed)
Choking Choking occurs when a food or object gets stuck in the throat or trachea, blocking the airway. If the airway is partly blocked, coughing will usually cause the food or object to come out. If the airway is completely blocked, immediate action is needed to help it come out. A complete airway blockage is life threatening because it causes breathing to stop. Choking is a true medical emergency that requires fast, appropriate action by anyone available. SIGNS OF AIRWAY BLOCKAGE There is a partial airway blockage if you or the person who is choking is:   Able to breathe and speak.  Coughing loudly.  Making loud noises. There is a complete airway blockage if you or the person who is choking is:   Unable to breathe.  Making soft or high-pitched sounds while breathing.  Unable to cough or coughing weakly, ineffectively, or silently.  Unable to cry, speak, or make sounds.  Turning blue.  Holding the neck with both arms. This is the universal sign of choking. WHAT TO DO IF CHOKING OCCURS If there is a partial airway blockage, allow coughing to clear the airway. Do not try to drink until the food or object comes out. If someone else has a partial airway blockage, do not interfere. Stay with him or her and watch for signs of complete airway blockage until the food or object comes out.  If there is a complete airway blockage or if there is a partial airway blockage and the food or object does not come out, perform abdominal thrusts (also referred to as the Heimlich maneuver). Abdominal thrusts are used to create an artificial cough to try to clear the airway. Performing abdominal thrusts is part of a series of steps that should be done to help someone who is choking. Abdominal thrusts are usually done by someone else, but if you are alone, you can perform abdominal thrusts on yourself. Follow the procedure below that best fits your situation.  IF SOMEONE ELSE IS CHOKING: For a conscious adult:    1. Ask the person whether he or she is choking. If the person nods, continue to step 2. 2. Stand or kneel behind the person and lean him or her forward slightly. 3. Make a fist with 1 hand, put your arms around the person, and grasp your fist with your other hand. Place the thumb side of your fist in the person's abdomen, just below the ribs. 4. Press inward and upward with both hands. 5. Repeat this maneuver until the object comes out and the person is able to breathe or until the person loses consciousness. For an unconscious adult: 1. Shout for help. If someone responds, have him or her call local emergency services (911 in [place] If no one responds, call local emergency services yourself if possible. 2. Begin CPR, starting with compressions. Every time you open the airway to give rescue breaths, open the person's mouth. If you can see the food or object and it can be easily pulled out, remove it with your fingers. 3. After 5 cycles or 2 minutes of CPR, call local emergency services (911 in [place] if you or someone else did not already call. For a conscious adult who is obese or in the later stages of pregnancy: Abdominal thrusts may not be effective when helping people who are in the later stages of pregnancy or who are obese. In these instances, chest thrusts can be used.  1. Ask the person whether he or she is choking. If the person  nods and has signs of complete airway blockage, continue to step 2. 2. Stand behind the person and wrap your arms around his or her chest (with your arms under the person's armpits). 3. Make a fist with 1 hand. Place the thumb side of your fist on the middle of the person's breastbone. 4. Grab your fist with your other hand and thrust backward. Continue this until the object comes out or until the person becomes unconscious. For an unconscious adult who is obese or in the later stages of pregnancy:  1. Shout for help. If someone responds, have him or her call local  emergency services (911 in [place] If no one responds, call local emergency services yourself if possible. 2. Begin CPR, starting with compressions. Every time you open the airway to give rescue breaths, open the person's mouth. If you can see the food or object and it can be easily pulled out, remove it with your fingers. 3. After 5 cycles or 2 minutes of CPR, call local emergency services (911 in [place] if you or someone else did not already call. Note that abdominal thrusts (below the rib cage) should be used for a pregnant woman if possible. This should be possible until the later stages of pregnancy when there is no longer enough room between the enlarging uterus and the rib cage to perform the maneuver. At that point, chest thrusts must be used as described. IF YOU ARE CHOKING: 1. Call local emergency services (911 in [place] if near a landline. Do not worry about communicating what is happening. Do not hang up the phone. Someone may be sent to help you anyway. 2. Make a fist with 1 hand. Put the thumb side of the fist against your stomach, just above the belly button and well below the breastbone. If you are pregnant or obese, put your fist on your chest instead, just below the breastbone and just above your lowest ribs. 3. Hold your fist with your other hand and bend over a hard surface, such as a table or chair. 4. Forcefully push your fist in and up. 5. Continue to do this until the food or object comes out. PREVENTION  To be prepared if choking occurs, learn how to correctly perform abdominal thrusts and give CPR by taking a certified first-aid training course.  SEEK IMMEDIATE MEDICAL CARE IF:  You have a fever after choking stops.  You have problems breathing after choking stops.  You received the Heimlich maneuver. MAKE SURE YOU:  Understand these instructions.  Watch your condition.  Get help right away if you are not doing well or get worse. Document Released: 10/14/2004 Document  Revised: 01/21/2014 Document Reviewed: 04/18/2012 [facility] Patient Information 2015 [place], [place]. This information is not intended to replace advice given to you by your health care provider. Make sure you discuss any questions you have with your health care provider.

## 2014-08-07 ENCOUNTER — Encounter (INDEPENDENT_AMBULATORY_CARE_PROVIDER_SITE_OTHER): Payer: Self-pay | Admitting: *Deleted

## 2014-08-08 ENCOUNTER — Encounter (INDEPENDENT_AMBULATORY_CARE_PROVIDER_SITE_OTHER): Payer: Self-pay | Admitting: *Deleted

## 2014-08-08 ENCOUNTER — Encounter (INDEPENDENT_AMBULATORY_CARE_PROVIDER_SITE_OTHER): Payer: Self-pay | Admitting: Internal Medicine

## 2014-08-08 ENCOUNTER — Ambulatory Visit (INDEPENDENT_AMBULATORY_CARE_PROVIDER_SITE_OTHER): Payer: Medicare Other | Admitting: Internal Medicine

## 2014-08-08 VITALS — BP 138/76 | HR 80 | Temp 97.8°F | Ht 72.0 in | Wt 207.0 lb

## 2014-08-08 DIAGNOSIS — T17308A Unspecified foreign body in larynx causing other injury, initial encounter: Secondary | ICD-10-CM

## 2014-08-08 DIAGNOSIS — I10 Essential (primary) hypertension: Secondary | ICD-10-CM | POA: Insufficient documentation

## 2014-08-08 DIAGNOSIS — K219 Gastro-esophageal reflux disease without esophagitis: Secondary | ICD-10-CM

## 2014-08-08 NOTE — Progress Notes (Signed)
Subjective:    Patient ID: Darius Smith, male    DOB: [birth year], 68 y.o.   MRN: [id number]  HPI In in the ED 2 days ago. He says he was eating a pork chop sandwich and had a syncopal episode.  He apparently choked on the food. He was coughing. His wife says his teeth were clinched. Wife unable to do the Heimlich maneuver. He vomited but was all liquid. He has been able to eat normally since this. He has eaten eggs,grits, sausage, orange juice. No problems with swallowing. Hx of esophageal stricture years ago and underwent an EGD/ED years ago (maybe 15 yrs ago). For the most part his acid reflux is controlled. Sometimes he will have acid reflux at night. He does not eat after 9pm.  Appetite good. No weight loss. BMs are normal.    07/18/2014 Screening Colonoscopy: Impression:  Examination performed to cecum. Three small polyps or ablated via cold biopsy and submitted together(cecum, ascending colon and proximal transverse colon). 5 mm polyp was cold snared from distal transverse colon. 7 mm polyp was hot snare from distal transverse colon and submitted with the other polyp removed from distal transverse colon. Left-sided diverticulosis. External hemorrhoids  Patient had 5 small polyps and they're tubular adenomas Results reviewed with patient's wife Next colonoscopy in 5 years Report to PCP   CBC    Component Value Date/Time   WBC 10.2 08/06/2014 1950   RBC 5.03 08/06/2014 1950   HGB 15.0 08/06/2014 1950   HCT 44.0 08/06/2014 1950   PLT 279 08/06/2014 1950   MCV 87.5 08/06/2014 1950   MCH 29.8 08/06/2014 1950   MCHC 34.1 08/06/2014 1950   RDW 12.5 08/06/2014 1950   LYMPHSABS 2.3 08/06/2014 1950   MONOABS 0.8 08/06/2014 1950   EOSABS 0.4 08/06/2014 1950   BASOSABS 0.1 08/06/2014 1950   CMP     Component Value Date/Time   NA 140 08/06/2014 1950   K 3.7 08/06/2014 1950   CL 102 08/06/2014 1950   CO2 21 08/06/2014 1950   GLUCOSE 116* 08/06/2014 1950   BUN 19  08/06/2014 1950   CREATININE 1.26 08/06/2014 1950   CALCIUM 9.0 08/06/2014 1950   PROT 7.0 04/22/2011 1108   ALBUMIN 4.1 04/22/2011 1108   AST 18 04/22/2011 1108   ALT 23 04/22/2011 1108   ALKPHOS 77 04/22/2011 1108   BILITOT 0.8 04/22/2011 1108   GFRNONAA 57* 08/06/2014 1950   GFRAA 66* 08/06/2014 1950      Review of Systems     Past Medical History  Diagnosis Date  . Shortness of breath   . Hypertension   . GERD (gastroesophageal reflux disease)   . Arthritis   . Abdominal hernia     Past Surgical History  Procedure Laterality Date  . Hernia repair    . Cholecystectomy    . Vocal cord surgery    . Colonoscopy  04/28/2011    Procedure: COLONOSCOPY;  Surgeon: Rogene Houston, MD;  Location: [facility];  Service: Endoscopy;  Laterality: N/A;  . Colonoscopy N/A 07/18/2014    Procedure: COLONOSCOPY;  Surgeon: Rogene Houston, MD;  Location: [facility];  Service: Endoscopy;  Laterality: N/A;  830    Allergies  Allergen Reactions  . Augmentin [Amoxicillin-Pot Clavulanate] Other (See Comments)    Increased liver enzymes    Current Outpatient Prescriptions on File Prior to Visit  Medication Sig Dispense Refill  . omeprazole (PRILOSEC) 20 MG capsule Take 20 mg by  mouth daily.       No current facility-administered medications on file prior to visit.        Objective:   Physical Exam  Filed Vitals:   08/08/14 1414  Height: 6' (1.829 m)  Weight: 207 lb (93.895 kg)   Alert and oriented. Skin warm and dry. Oral mucosa is moist.   . Sclera anicteric, conjunctivae is pink. Thyroid not enlarged. No cervical lymphadenopathy. Lungs clear. Heart regular rate and rhythm.  Abdomen is soft. Bowel sounds are positive. No hepatomegaly. No abdominal masses felt. No tenderness.  No edema to lower extremities.          Assessment & Plan:  ? Choking episode with syncopal episode. Do not think he really has dysphagia. I will get a swallow test to be sure he does not have a  stricture. DG esophagram.

## 2014-08-08 NOTE — Patient Instructions (Signed)
DG esophagram.   

## 2014-08-19 ENCOUNTER — Ambulatory Visit (HOSPITAL_COMMUNITY)
Admission: RE | Admit: 2014-08-19 | Discharge: 2014-08-19 | Disposition: A | Discharge status: Home / Self Care | Payer: Medicare Other | Source: Ambulatory Visit | Attending: Internal Medicine | Admitting: Internal Medicine

## 2014-08-19 DIAGNOSIS — I10 Essential (primary) hypertension: Secondary | ICD-10-CM | POA: Insufficient documentation

## 2014-08-19 DIAGNOSIS — T17308A Unspecified foreign body in larynx causing other injury, initial encounter: Secondary | ICD-10-CM

## 2014-08-19 DIAGNOSIS — K219 Gastro-esophageal reflux disease without esophagitis: Secondary | ICD-10-CM | POA: Insufficient documentation

## 2015-03-17 ENCOUNTER — Other Ambulatory Visit: Payer: Self-pay

## 2015-10-23 ENCOUNTER — Encounter: Payer: Self-pay | Admitting: Family Medicine

## 2016-04-21 DIAGNOSIS — J019 Acute sinusitis, unspecified: Secondary | ICD-10-CM | POA: Diagnosis not present

## 2016-04-21 DIAGNOSIS — I10 Essential (primary) hypertension: Secondary | ICD-10-CM | POA: Diagnosis not present

## 2016-04-21 DIAGNOSIS — K210 Gastro-esophageal reflux disease with esophagitis: Secondary | ICD-10-CM | POA: Diagnosis not present

## 2016-06-01 DIAGNOSIS — Z0000 Encounter for general adult medical examination without abnormal findings: Secondary | ICD-10-CM | POA: Diagnosis not present

## 2016-06-02 DIAGNOSIS — Z125 Encounter for screening for malignant neoplasm of prostate: Secondary | ICD-10-CM | POA: Diagnosis not present

## 2016-06-02 DIAGNOSIS — Z0000 Encounter for general adult medical examination without abnormal findings: Secondary | ICD-10-CM | POA: Diagnosis not present

## 2016-06-02 DIAGNOSIS — K210 Gastro-esophageal reflux disease with esophagitis: Secondary | ICD-10-CM | POA: Diagnosis not present

## 2016-06-02 DIAGNOSIS — N509 Disorder of male genital organs, unspecified: Secondary | ICD-10-CM | POA: Diagnosis not present

## 2016-06-02 DIAGNOSIS — I10 Essential (primary) hypertension: Secondary | ICD-10-CM | POA: Diagnosis not present

## 2016-06-23 DIAGNOSIS — Z23 Encounter for immunization: Secondary | ICD-10-CM | POA: Diagnosis not present

## 2016-09-28 DIAGNOSIS — H5203 Hypermetropia, bilateral: Secondary | ICD-10-CM | POA: Diagnosis not present

## 2016-12-29 DIAGNOSIS — J309 Allergic rhinitis, unspecified: Secondary | ICD-10-CM | POA: Diagnosis not present

## 2016-12-29 DIAGNOSIS — I10 Essential (primary) hypertension: Secondary | ICD-10-CM | POA: Diagnosis not present

## 2016-12-29 DIAGNOSIS — K210 Gastro-esophageal reflux disease with esophagitis: Secondary | ICD-10-CM | POA: Diagnosis not present

## 2016-12-29 DIAGNOSIS — J019 Acute sinusitis, unspecified: Secondary | ICD-10-CM | POA: Diagnosis not present

## 2017-04-05 DIAGNOSIS — D485 Neoplasm of uncertain behavior of skin: Secondary | ICD-10-CM | POA: Diagnosis not present

## 2017-04-05 DIAGNOSIS — L820 Inflamed seborrheic keratosis: Secondary | ICD-10-CM | POA: Diagnosis not present

## 2017-04-05 DIAGNOSIS — Z85828 Personal history of other malignant neoplasm of skin: Secondary | ICD-10-CM | POA: Diagnosis not present

## 2017-06-21 DIAGNOSIS — Z23 Encounter for immunization: Secondary | ICD-10-CM | POA: Diagnosis not present

## 2017-06-30 DIAGNOSIS — Z0000 Encounter for general adult medical examination without abnormal findings: Secondary | ICD-10-CM | POA: Diagnosis not present

## 2017-07-04 DIAGNOSIS — K210 Gastro-esophageal reflux disease with esophagitis: Secondary | ICD-10-CM | POA: Diagnosis not present

## 2017-07-04 DIAGNOSIS — I10 Essential (primary) hypertension: Secondary | ICD-10-CM | POA: Diagnosis not present

## 2017-07-04 DIAGNOSIS — N509 Disorder of male genital organs, unspecified: Secondary | ICD-10-CM | POA: Diagnosis not present

## 2017-07-04 DIAGNOSIS — Z0000 Encounter for general adult medical examination without abnormal findings: Secondary | ICD-10-CM | POA: Diagnosis not present

## 2017-07-14 DIAGNOSIS — Z1211 Encounter for screening for malignant neoplasm of colon: Secondary | ICD-10-CM | POA: Diagnosis not present

## 2017-08-20 DIAGNOSIS — K5730 Diverticulosis of large intestine without perforation or abscess without bleeding: Secondary | ICD-10-CM

## 2017-08-20 DIAGNOSIS — Z860100 Personal history of colon polyps, unspecified: Secondary | ICD-10-CM

## 2017-08-20 DIAGNOSIS — K644 Residual hemorrhoidal skin tags: Secondary | ICD-10-CM

## 2017-08-20 HISTORY — DX: Diverticulosis of large intestine without perforation or abscess without bleeding: K57.30

## 2017-08-20 HISTORY — DX: Personal history of colonic polyps: Z86.010

## 2017-08-20 HISTORY — DX: Personal history of colon polyps, unspecified: Z86.0100

## 2017-08-20 HISTORY — DX: Residual hemorrhoidal skin tags: K64.4

## 2017-08-31 ENCOUNTER — Emergency Department (HOSPITAL_COMMUNITY): Payer: Medicare Other

## 2017-08-31 ENCOUNTER — Emergency Department (HOSPITAL_COMMUNITY)
Admission: EM | Admit: 2017-08-31 | Discharge: 2017-08-31 | Disposition: A | Discharge status: Home / Self Care | Payer: Medicare Other | Attending: Emergency Medicine | Admitting: Emergency Medicine

## 2017-08-31 ENCOUNTER — Other Ambulatory Visit: Payer: Self-pay

## 2017-08-31 ENCOUNTER — Encounter (HOSPITAL_COMMUNITY): Payer: Self-pay | Admitting: *Deleted

## 2017-08-31 DIAGNOSIS — R109 Unspecified abdominal pain: Secondary | ICD-10-CM | POA: Diagnosis not present

## 2017-08-31 DIAGNOSIS — K625 Hemorrhage of anus and rectum: Secondary | ICD-10-CM

## 2017-08-31 DIAGNOSIS — I10 Essential (primary) hypertension: Secondary | ICD-10-CM | POA: Diagnosis not present

## 2017-08-31 DIAGNOSIS — N201 Calculus of ureter: Secondary | ICD-10-CM | POA: Insufficient documentation

## 2017-08-31 DIAGNOSIS — Z79899 Other long term (current) drug therapy: Secondary | ICD-10-CM | POA: Insufficient documentation

## 2017-08-31 DIAGNOSIS — Z9049 Acquired absence of other specified parts of digestive tract: Secondary | ICD-10-CM | POA: Diagnosis not present

## 2017-08-31 DIAGNOSIS — N132 Hydronephrosis with renal and ureteral calculous obstruction: Secondary | ICD-10-CM | POA: Diagnosis not present

## 2017-08-31 LAB — URINALYSIS, ROUTINE W REFLEX MICROSCOPIC
Bilirubin Urine: NEGATIVE
Glucose, UA: NEGATIVE mg/dL
Hgb urine dipstick: NEGATIVE
Ketones, ur: NEGATIVE mg/dL
Leukocytes, UA: NEGATIVE
Nitrite: NEGATIVE
Protein, ur: NEGATIVE mg/dL
Specific Gravity, Urine: 1.025 (ref 1.005–1.030)
pH: 5 (ref 5.0–8.0)

## 2017-08-31 LAB — COMPREHENSIVE METABOLIC PANEL
ALT: 21 U/L (ref 17–63)
AST: 26 U/L (ref 15–41)
Albumin: 3.8 g/dL (ref 3.5–5.0)
Alkaline Phosphatase: 60 U/L (ref 38–126)
Anion gap: 9 (ref 5–15)
BUN: 27 mg/dL — ABNORMAL HIGH (ref 6–20)
CO2: 23 mmol/L (ref 22–32)
Calcium: 9.1 mg/dL (ref 8.9–10.3)
Chloride: 106 mmol/L (ref 101–111)
Creatinine, Ser: 1.69 mg/dL — ABNORMAL HIGH (ref 0.61–1.24)
GFR calc Af Amer: 45 mL/min — ABNORMAL LOW (ref >60)
GFR calc non Af Amer: 39 mL/min — ABNORMAL LOW (ref >60)
Glucose, Bld: 97 mg/dL (ref 65–99)
Potassium: 4 mmol/L (ref 3.5–5.1)
Sodium: 138 mmol/L (ref 135–145)
Total Bilirubin: 0.7 mg/dL (ref 0.3–1.2)
Total Protein: 7.4 g/dL (ref 6.5–8.1)

## 2017-08-31 LAB — TYPE AND SCREEN
ABO/RH(D): O POS
Antibody Screen: NEGATIVE

## 2017-08-31 LAB — CBC
HCT: 43.8 % (ref 39.0–52.0)
Hemoglobin: 14 g/dL (ref 13.0–17.0)
MCH: 29.4 pg (ref 26.0–34.0)
MCHC: 32 g/dL (ref 30.0–36.0)
MCV: 92 fL (ref 78.0–100.0)
Platelets: 255 10*3/uL (ref 150–400)
RBC: 4.76 MIL/uL (ref 4.22–5.81)
RDW: 12.8 % (ref 11.5–15.5)
WBC: 8.4 10*3/uL (ref 4.0–10.5)

## 2017-08-31 LAB — POC OCCULT BLOOD, ED: Fecal Occult Bld: POSITIVE — AB

## 2017-08-31 MED ORDER — SODIUM CHLORIDE 0.9 % IV BOLUS (SEPSIS)
1000.0000 mL | Freq: Once | INTRAVENOUS | Status: AC
  Administered 2017-08-31: 1000 mL via INTRAVENOUS

## 2017-08-31 MED ORDER — TAMSULOSIN HCL 0.4 MG PO CAPS: 0.4000 mg | ORAL_CAPSULE | Freq: Every day | ORAL | 0 refills | Status: DC

## 2017-08-31 MED ORDER — IOPAMIDOL (ISOVUE-300) INJECTION 61%
100.0000 mL | Freq: Once | INTRAVENOUS | Status: AC | PRN
  Administered 2017-08-31: 80 mL via INTRAVENOUS

## 2017-08-31 NOTE — ED Provider Notes (Signed)
[facility] EMERGENCY DEPARTMENT Provider Note   CSN: [id number] Arrival date & time: 08/31/17  1624     History   Chief Complaint Chief Complaint  Patient presents with  . Rectal Bleeding    HPI Darius Smith is a 71 y.o. male.  HPI  71 year old male presents after an episode of rectal bleeding.  This occurred this afternoon around 3 PM.  This is occurred one time and not recurred.  He states that there was no blood in the stool but a large amount of blood in the bowl.  He denies any rectal pain.  No headache, dizziness, or lightheadedness.  He has had on and off right-sided abdominal pain for about 2 weeks, a little more prominent this morning.  He is not on any blood thinners.  He states his last colonoscopy was about 3 years ago by Dr. Laural Golden and he has to get them every few years due to polyps.  Past Medical History:  Diagnosis Date  . Abdominal hernia   . Arthritis   . GERD (gastroesophageal reflux disease)   . Hypertension   . Shortness of breath     Patient Active Problem List   Diagnosis Date Noted  . Essential hypertension 08/08/2014  . GERD (gastroesophageal reflux disease) 08/08/2014    Past Surgical History:  Procedure Laterality Date  . CHOLECYSTECTOMY    . COLONOSCOPY  04/28/2011   Procedure: COLONOSCOPY;  Surgeon: Rogene Houston, MD;  Location: [facility];  Service: Endoscopy;  Laterality: N/A;  . COLONOSCOPY N/A 07/18/2014   Procedure: COLONOSCOPY;  Surgeon: Rogene Houston, MD;  Location: [facility];  Service: Endoscopy;  Laterality: N/A;  830  . HERNIA REPAIR    . vocal cord surgery         Home Medications    Prior to Admission medications   Medication Sig Start Date End Date Taking? Authorizing Provider  amLODipine-benazepril (LOTREL) 10-20 MG per capsule Take 1 capsule by mouth daily.    [provider]  omeprazole (PRILOSEC) 20 MG capsule Take 20 mg by mouth daily.      [provider]  tamsulosin (FLOMAX) 0.4 MG  CAPS capsule Take 1 capsule (0.4 mg total) by mouth daily. 08/31/17   Sherwood Gambler, MD    Family History No family history on file.  Social History Social History   Tobacco Use  . Smoking status: Never Smoker  . Smokeless tobacco: Never Used  Substance Use Topics  . Alcohol use: Yes    Alcohol/week: 4.2 oz    Types: 7 Cans of beer per week  . Drug use: No     Allergies   Augmentin [amoxicillin-pot clavulanate]   Review of Systems Review of Systems  Respiratory: Negative for shortness of breath.   Cardiovascular: Negative for chest pain.  Gastrointestinal: Positive for abdominal pain, blood in stool and diarrhea. Negative for rectal pain and vomiting.  Neurological: Negative for dizziness and light-headedness.  All other systems reviewed and are negative.    Physical Exam Updated Vital Signs BP (!) 147/98   Pulse 80   Temp 98.4 F (36.9 C) (Oral)   Resp 17   Ht 6' (1.829 m)   Wt 93 kg (205 lb)   SpO2 95%   BMI 27.80 kg/m   Physical Exam  Constitutional: He is oriented to person, place, and time. He appears well-developed and well-nourished.  HENT:  Head: Normocephalic and atraumatic.  Right Ear: External ear normal.  Left Ear: External  ear normal.  Nose: Nose normal.  Eyes: Right eye exhibits no discharge. Left eye exhibits no discharge.  Neck: Neck supple.  Cardiovascular: Normal rate, regular rhythm and normal heart sounds.  Pulmonary/Chest: Effort normal and breath sounds normal.  Abdominal: Soft. There is tenderness (mild).    Genitourinary: Rectal exam shows guaiac positive stool. Rectal exam shows no external hemorrhoid, no mass and no tenderness.  Genitourinary Comments: Mild pink tinged fluid on digit after rectal exam. Hemoccult positive  Musculoskeletal: He exhibits no edema.  Neurological: He is alert and oriented to person, place, and time.  Skin: Skin is warm and dry.  Nursing note and vitals reviewed.    ED Treatments / Results    Labs (all labs ordered are listed, but only abnormal results are displayed) Labs Reviewed  COMPREHENSIVE METABOLIC PANEL - Abnormal; Notable for the following components:      Result Value   BUN 27 (*)    Creatinine, Ser 1.69 (*)    GFR calc non Af Amer 39 (*)    GFR calc Af Amer 45 (*)    All other components within normal limits  POC OCCULT BLOOD, ED - Abnormal; Notable for the following components:   Fecal Occult Bld POSITIVE (*)    All other components within normal limits  CBC  URINALYSIS, ROUTINE W REFLEX MICROSCOPIC  TYPE AND SCREEN    EKG  EKG Interpretation None       Radiology Ct Abdomen Pelvis W Contrast  Result Date: 08/31/2017 CLINICAL DATA:  Right lower abdominal pain and rectal bleeding. EXAM: CT ABDOMEN AND PELVIS WITH CONTRAST TECHNIQUE: Multidetector CT imaging of the abdomen and pelvis was performed using the standard protocol following bolus administration of intravenous contrast. CONTRAST:  75mL ISOVUE-300 IOPAMIDOL (ISOVUE-300) INJECTION 61% COMPARISON:  Abdominal ultrasound 09/24/2010 FINDINGS: Lower chest: No acute abnormality. Hepatobiliary: No focal liver abnormality is seen. Status post cholecystectomy. No biliary dilatation. Pancreas: Unremarkable. No pancreatic ductal dilatation or surrounding inflammatory changes. Spleen: Normal in size without focal abnormality. Adrenals/Urinary Tract: Normal adrenal glands. Benign-appearing left renal cysts, the larger measuring 4.5 cm. No evidence of hydronephrosis or nephrolithiasis on the left. Moderate right hydronephrosis and proximal hydroureter caused by an obstructing 6 mm calculus in the midportion of the right ureter. Associated right perinephric fat stranding. Normal appearance of the urinary bladder. Stomach/Bowel: Normal appearance of stomach and small bowel. The appendix is noninflamed and descends inferior to the cecum into a fat containing right inguinal hernia axial images 42-45/108, sequence 5. Heavy  left colonic diverticulosis. Mild thickening of the sigmoid colon/proximal rectum. Vascular/Lymphatic: Aortic atherosclerosis. No enlarged abdominal or pelvic lymph nodes. Reproductive: Enlarged heterogeneous appearance of the prostate gland, which measures 6.1 cm. Other: Bilateral fat containing inguinal hernias. The right inguinal hernia contains the tip of the non inflamed appendix and a small amount of high density complex fluid. Small wide neck periumbilical anterior abdominal wall hernia containing normal appearing small bowel. Musculoskeletal: No acute or significant osseous findings. IMPRESSION: Right obstructive uropathy caused by 6 mm right mid ureteral calculus. Resultant moderate to severe right hydronephrosis. Heavy left colonic diverticulosis, with possible acute or chronic diverticulitis of the distal sigmoid colon/ proximal rectum. Underlying malignancy cannot be entirely excluded. Please correlate to colonoscopy results. Right inguinal hernia containing the tip of the non inflamed appendix. Small amount of phlegmonous fluid is contained within the hernial sac as well. Electronically Signed   By: Fidela Salisbury M.D.   On: 08/31/2017 19:31    Procedures Procedures (  including critical care time)  Medications Ordered in ED Medications  sodium chloride 0.9 % bolus 1,000 mL (1,000 mLs Intravenous New Bag/Given 08/31/17 1858)  iopamidol (ISOVUE-300) 61 % injection 100 mL (80 mLs Intravenous Contrast Given 08/31/17 1902)     Initial Impression / Assessment and Plan / ED Course  I have reviewed the triage vital signs and the nursing notes.  Pertinent labs & imaging results that were available during my care of the patient were reviewed by me and considered in my medical decision making (see chart for details).     Patient has had no further bleeding.  He has a mild bump in his creatinine although this is compared to over 3 years ago.  He was given some IV fluids.  His CT reveals a  right ureteral stone that is likely the cause of on and off right-sided abdominal pain.  However no severe or uncontrolled pain.  He declines pain medicine stronger than Tylenol.  No signs of UTI or sepsis.  As for his bleeding, likely diverticula is the cause.  I discussed his case and CT results including distal findings with his gastroenterologist, Dr. Laural Golden, who will help arrange for a colonoscopy early next week.  Patient will be called for this.  The patient was instructed to make sure he does not take aspirin or NSAIDs.  He is not on any other blood thinners.  His hemoglobin is 14 and given no further bleeding I think he is stable for an outpatient workup.  Follow-up with urology further ureteral stone and placed on Flomax given size and mid ureter position.  Discussed return precautions.  Final Clinical Impressions(s) / ED Diagnoses   Final diagnoses:  Rectal bleeding  Ureteral stone    ED Discharge Orders        Ordered    tamsulosin (FLOMAX) 0.4 MG CAPS capsule  Daily     08/31/17 2013       Sherwood Gambler, MD 08/31/17 2017

## 2017-08-31 NOTE — ED Triage Notes (Signed)
Pt c/o right lower abd pain for the past few days, bright red rectal bleeding that started today.

## 2017-08-31 NOTE — ED Notes (Signed)
Pt returned from CT °

## 2017-08-31 NOTE — Discharge Instructions (Signed)
Do not use aspirin or anti-inflammatory such as ibuprofen until cleared by Dr. Laural Golden.  If you develop fever, vomiting, or recurrent bleeding, call your doctor or return to the ER for evaluation.

## 2017-09-02 ENCOUNTER — Encounter (INDEPENDENT_AMBULATORY_CARE_PROVIDER_SITE_OTHER): Payer: Self-pay | Admitting: *Deleted

## 2017-09-02 ENCOUNTER — Other Ambulatory Visit (INDEPENDENT_AMBULATORY_CARE_PROVIDER_SITE_OTHER): Payer: Self-pay | Admitting: *Deleted

## 2017-09-02 DIAGNOSIS — K625 Hemorrhage of anus and rectum: Secondary | ICD-10-CM

## 2017-09-02 HISTORY — DX: Hemorrhage of anus and rectum: K62.5

## 2017-09-05 DIAGNOSIS — N201 Calculus of ureter: Secondary | ICD-10-CM | POA: Diagnosis not present

## 2017-09-05 DIAGNOSIS — N281 Cyst of kidney, acquired: Secondary | ICD-10-CM | POA: Diagnosis not present

## 2017-09-09 ENCOUNTER — Encounter (HOSPITAL_COMMUNITY)
Admission: RE | Disposition: A | Discharge status: Home / Self Care | Payer: Self-pay | Source: Ambulatory Visit | Attending: Internal Medicine

## 2017-09-09 ENCOUNTER — Ambulatory Visit (HOSPITAL_COMMUNITY)
Admission: RE | Admit: 2017-09-09 | Discharge: 2017-09-09 | Disposition: A | Discharge status: Home / Self Care | Payer: Medicare Other | Source: Ambulatory Visit | Attending: Internal Medicine | Admitting: Internal Medicine

## 2017-09-09 ENCOUNTER — Encounter (HOSPITAL_COMMUNITY): Payer: Self-pay | Admitting: *Deleted

## 2017-09-09 ENCOUNTER — Other Ambulatory Visit: Payer: Self-pay

## 2017-09-09 DIAGNOSIS — Z86010 Personal history of colonic polyps: Secondary | ICD-10-CM | POA: Insufficient documentation

## 2017-09-09 DIAGNOSIS — I10 Essential (primary) hypertension: Secondary | ICD-10-CM | POA: Insufficient documentation

## 2017-09-09 DIAGNOSIS — D123 Benign neoplasm of transverse colon: Secondary | ICD-10-CM | POA: Insufficient documentation

## 2017-09-09 DIAGNOSIS — R0602 Shortness of breath: Secondary | ICD-10-CM | POA: Insufficient documentation

## 2017-09-09 DIAGNOSIS — D122 Benign neoplasm of ascending colon: Secondary | ICD-10-CM | POA: Diagnosis not present

## 2017-09-09 DIAGNOSIS — Z803 Family history of malignant neoplasm of breast: Secondary | ICD-10-CM | POA: Diagnosis not present

## 2017-09-09 DIAGNOSIS — Z825 Family history of asthma and other chronic lower respiratory diseases: Secondary | ICD-10-CM | POA: Insufficient documentation

## 2017-09-09 DIAGNOSIS — K644 Residual hemorrhoidal skin tags: Secondary | ICD-10-CM | POA: Diagnosis not present

## 2017-09-09 DIAGNOSIS — K5730 Diverticulosis of large intestine without perforation or abscess without bleeding: Secondary | ICD-10-CM | POA: Insufficient documentation

## 2017-09-09 DIAGNOSIS — Z87442 Personal history of urinary calculi: Secondary | ICD-10-CM | POA: Diagnosis not present

## 2017-09-09 DIAGNOSIS — K625 Hemorrhage of anus and rectum: Secondary | ICD-10-CM | POA: Diagnosis not present

## 2017-09-09 DIAGNOSIS — Z9049 Acquired absence of other specified parts of digestive tract: Secondary | ICD-10-CM | POA: Diagnosis not present

## 2017-09-09 DIAGNOSIS — Z881 Allergy status to other antibiotic agents status: Secondary | ICD-10-CM | POA: Insufficient documentation

## 2017-09-09 DIAGNOSIS — D127 Benign neoplasm of rectosigmoid junction: Secondary | ICD-10-CM | POA: Diagnosis not present

## 2017-09-09 DIAGNOSIS — Z79899 Other long term (current) drug therapy: Secondary | ICD-10-CM | POA: Insufficient documentation

## 2017-09-09 DIAGNOSIS — Z880 Allergy status to penicillin: Secondary | ICD-10-CM | POA: Diagnosis not present

## 2017-09-09 DIAGNOSIS — Z8042 Family history of malignant neoplasm of prostate: Secondary | ICD-10-CM | POA: Insufficient documentation

## 2017-09-09 DIAGNOSIS — Z7951 Long term (current) use of inhaled steroids: Secondary | ICD-10-CM | POA: Diagnosis not present

## 2017-09-09 DIAGNOSIS — K219 Gastro-esophageal reflux disease without esophagitis: Secondary | ICD-10-CM | POA: Insufficient documentation

## 2017-09-09 HISTORY — DX: Personal history of urinary calculi: Z87.442

## 2017-09-09 HISTORY — PX: COLONOSCOPY: SHX5424

## 2017-09-09 HISTORY — PX: POLYPECTOMY: SHX5525

## 2017-09-09 SURGERY — COLONOSCOPY
Anesthesia: Moderate Sedation

## 2017-09-09 MED ORDER — SODIUM CHLORIDE 0.9 % IV SOLN
INTRAVENOUS | Status: DC
  Administered 2017-09-09: 1000 mL via INTRAVENOUS

## 2017-09-09 MED ORDER — MIDAZOLAM HCL 5 MG/5ML IJ SOLN
INTRAMUSCULAR | Status: DC | PRN
  Administered 2017-09-09: 1 mg via INTRAVENOUS
  Administered 2017-09-09: 2 mg via INTRAVENOUS
  Administered 2017-09-09: 1 mg via INTRAVENOUS
  Administered 2017-09-09: 2 mg via INTRAVENOUS

## 2017-09-09 MED ORDER — MIDAZOLAM HCL 5 MG/5ML IJ SOLN
INTRAMUSCULAR | Status: AC
  Filled 2017-09-09: qty 10

## 2017-09-09 MED ORDER — MEPERIDINE HCL 50 MG/ML IJ SOLN
INTRAMUSCULAR | Status: DC | PRN
  Administered 2017-09-09 (×2): 25 mg via INTRAVENOUS

## 2017-09-09 MED ORDER — MEPERIDINE HCL 50 MG/ML IJ SOLN
INTRAMUSCULAR | Status: AC
  Filled 2017-09-09: qty 1

## 2017-09-09 MED ORDER — STERILE WATER FOR IRRIGATION IR SOLN
Status: DC | PRN
  Administered 2017-09-09: 11:00:00

## 2017-09-09 NOTE — H&P (Signed)
Darius Smith is an 71 y.o. male.   Chief Complaint: Patient is here for colonoscopy. HPI: Patient is 71 year old Caucasian male who has a history of colonic diverticulosis and colonic adenomas whose last colonoscopy was in October 2015 with removal of 5 small tubular adenomas.  He presented to emergency room few days ago with bright red blood per rectum.  He was hemodynamically stable.  He was observed in the emergency room for a few hours and discharged.  Says bleeding stopped after 3 days.  He did not experience abdominal pain nausea vomiting fever or chills.  He also did not have diarrhea.  His bowels are normal.  He does not take aspirin or other OTC NSAIDs on frequent basis.  He recalls he took 1 or 2 doses of ibuprofen right hip pain day or 2 before bleeding started. Family history is negative for CRC.  Past Medical History:  Diagnosis Date  . Abdominal hernia   . Arthritis   . GERD (gastroesophageal reflux disease)   . History of kidney stones   . Hypertension   . Shortness of breath     Past Surgical History:  Procedure Laterality Date  . CHOLECYSTECTOMY    . COLONOSCOPY  04/28/2011   Procedure: COLONOSCOPY;  Surgeon: Rogene Houston, MD;  Location: [facility];  Service: Endoscopy;  Laterality: N/A;  . COLONOSCOPY N/A 07/18/2014   Procedure: COLONOSCOPY;  Surgeon: Rogene Houston, MD;  Location: [facility];  Service: Endoscopy;  Laterality: N/A;  830  . HERNIA REPAIR    . vocal cord surgery      Family History  Problem Relation Age of Onset  . Breast cancer Mother   . Prostate cancer Father   . COPD Sister    Social History:  reports that  has never smoked. he has never used smokeless tobacco. He reports that he drinks about 4.2 oz of alcohol per week. He reports that he does not use drugs.  Allergies:  Allergies  Allergen Reactions  . Augmentin [Amoxicillin-Pot Clavulanate] Other (See Comments)    Increased liver enzymes Has patient had a PCN reaction causing  immediate rash, facial/tongue/throat swelling, SOB or lightheadedness with hypotension: No Has patient had a PCN reaction causing severe rash involving mucus membranes or skin necrosis: No Has patient had a PCN reaction that required hospitalization: No Has patient had a PCN reaction occurring within the last 10 years: No  If all of the above answers are "NO", then may proceed with Cephalosporin use.     Medications Prior to Admission  Medication Sig Dispense Refill  . amLODipine (NORVASC) 10 MG tablet Take 10 mg by mouth daily.  0  . benazepril (LOTENSIN) 40 MG tablet Take 40 mg by mouth daily.  11  . cetirizine (ZYRTEC) 10 MG tablet Take 10 mg by mouth daily as needed for allergies.    Marland Kitchen omeprazole (PRILOSEC) 20 MG capsule Take 20 mg by mouth daily as needed (for heartburn).     . tamsulosin (FLOMAX) 0.4 MG CAPS capsule Take 1 capsule (0.4 mg total) by mouth daily. 10 capsule 0  . VENTOLIN HFA 108 (90 Base) MCG/ACT inhaler Inhale 2 puffs into the lungs every 6 (six) hours as needed for wheezing or shortness of breath.   11  . acetaminophen (TYLENOL) 500 MG tablet Take 1,000 mg by mouth every 6 (six) hours as needed for moderate pain or headache.      No results found for this or any previous visit (  from the past 48 hour(s)). No results found.  ROS  Blood pressure (!) 154/83, pulse 72, temperature 97.8 F (36.6 C), temperature source Oral, resp. rate 17, height 6' (1.829 m), weight 203 lb (92.1 kg), SpO2 97 %. Physical Exam  Constitutional: He appears well-developed and well-nourished.  HENT:  Mouth/Throat: Oropharynx is clear and moist.  Eyes: Conjunctivae are normal. No scleral icterus.  Neck: No thyromegaly present.  Cardiovascular: Normal rate, regular rhythm and normal heart sounds.  No murmur heard. Respiratory: Effort normal and breath sounds normal.  GI: Soft. He exhibits no distension and no mass. There is no tenderness.  Musculoskeletal: He exhibits edema.   Lymphadenopathy:    He has no cervical adenopathy.  Neurological: He is alert.  Skin: Skin is warm and dry.     Assessment/Plan Rectal bleeding. History of colonic adenomas. Diagnostic colonoscopy.  Hildred Laser, MD 09/09/2017, 11:24 AM

## 2017-09-09 NOTE — Discharge Instructions (Signed)
No aspirin or NSAIDs for 1 week.Marland Kitchen Resume usual medications as before. High fiber diet. No driving for 24 hours. Physician will call with biopsy results.   Colonoscopy, Adult, Care After This sheet gives you information about how to care for yourself after your procedure. Your doctor may also give you more specific instructions. If you have problems or questions, call your doctor. Follow these instructions at home: General instructions   For the first 24 hours after the procedure: ? Do not drive or use machinery. ? Do not sign important documents. ? Do not drink alcohol. ? Do your daily activities more slowly than normal. ? Eat foods that are soft and easy to digest. ? Rest often.  Take over-the-counter or prescription medicines only as told by your doctor.  It is up to you to get the results of your procedure. Ask your doctor, or the department performing the procedure, when your results will be ready. To help cramping and bloating:  Try walking around.  Put heat on your belly (abdomen) as told by your doctor. Use a heat source that your doctor recommends, such as a moist heat pack or a heating pad. ? Put a towel between your skin and the heat source. ? Leave the heat on for 20-30 minutes. ? Remove the heat if your skin turns bright red. This is especially important if you cannot feel pain, heat, or cold. You can get burned. Eating and drinking  Drink enough fluid to keep your pee (urine) clear or pale yellow.  Return to your normal diet as told by your doctor. Avoid heavy or fried foods that are hard to digest.  Avoid drinking alcohol for as long as told by your doctor. Contact a doctor if:  You have blood in your poop (stool) 2-3 days after the procedure. Get help right away if:  You have more than a small amount of blood in your poop.  You see large clumps of tissue (blood clots) in your poop.  Your belly is swollen.  You feel sick to your stomach  (nauseous).  You throw up (vomit).  You have a fever.  You have belly pain that gets worse, and medicine does not help your pain. This information is not intended to replace advice given to you by your health care provider. Make sure you discuss any questions you have with your health care provider. Document Released: 10/09/2010 Document Revised: 05/31/2016 Document Reviewed: 05/31/2016 Elsevier Interactive Patient Education  2017 [employer]  Diverticulosis Diverticulosis is a condition that develops when small pouches (diverticula) form in the wall of the large intestine (colon). The colon is where water is absorbed and stool is formed. The pouches form when the inside layer of the colon pushes through weak spots in the outer layers of the colon. You may have a few pouches or many of them. What are the causes? The cause of this condition is not known. What increases the risk? The following factors may make you more likely to develop this condition:  Being older than age 65. Your risk for this condition increases with age. Diverticulosis is rare among people younger than age 39. By age 52, many people have it.  Eating a low-fiber diet.  Having frequent constipation.  Being overweight.  Not getting enough exercise.  Smoking.  Taking over-the-counter pain medicines, like aspirin and ibuprofen.  Having a family history of diverticulosis.  What are the signs or symptoms? In most people, there are no symptoms of this condition. If  you do have symptoms, they may include:  Bloating.  Cramps in the abdomen.  Constipation or diarrhea.  Pain in the lower left side of the abdomen.  How is this diagnosed? This condition is most often diagnosed during an exam for other colon problems. Because diverticulosis usually has no symptoms, it often cannot be diagnosed independently. This condition may be diagnosed by:  Using a flexible scope to examine the colon  (colonoscopy).  Taking an X-ray of the colon after dye has been put into the colon (barium enema).  Doing a CT scan.  How is this treated? You may not need treatment for this condition if you have never developed an infection related to diverticulosis. If you have had an infection before, treatment may include:  Eating a high-fiber diet. This may include eating more fruits, vegetables, and grains.  Taking a fiber supplement.  Taking a live bacteria supplement (probiotic).  Taking medicine to relax your colon.  Taking antibiotic medicines.  Follow these instructions at home:  Drink 6-8 glasses of water or more each day to prevent constipation.  Try not to strain when you have a bowel movement.  If you have had an infection before: ? Eat more fiber as directed by your health care provider or your diet and nutrition specialist (dietitian). ? Take a fiber supplement or probiotic, if your health care provider approves.  Take over-the-counter and prescription medicines only as told by your health care provider.  If you were prescribed an antibiotic, take it as told by your health care provider. Do not stop taking the antibiotic even if you start to feel better.  Keep all follow-up visits as told by your health care provider. This is important. Contact a health care provider if:  You have pain in your abdomen.  You have bloating.  You have cramps.  You have not had a bowel movement in 3 days. Get help right away if:  Your pain gets worse.  Your bloating becomes very bad.  You have a fever or chills, and your symptoms suddenly get worse.  You vomit.  You have bowel movements that are bloody or black.  You have bleeding from your rectum. Summary  Diverticulosis is a condition that develops when small pouches (diverticula) form in the wall of the large intestine (colon).  You may have a few pouches or many of them.  This condition is most often diagnosed during an  exam for other colon problems.  If you have had an infection related to diverticulosis, treatment may include increasing the fiber in your diet, taking supplements, or taking medicines. This information is not intended to replace advice given to you by your health care provider. Make sure you discuss any questions you have with your health care provider. Document Released: 06/03/2004 Document Revised: 07/26/2016 Document Reviewed: 07/26/2016 Elsevier Interactive Patient Education  2017 [employer].  Colon Polyps Polyps are tissue growths inside the body. Polyps can grow in many places, including the large intestine (colon). A polyp may be a round bump or a mushroom-shaped growth. You could have one polyp or several. Most colon polyps are noncancerous (benign). However, some colon polyps can become cancerous over time. What are the causes? The exact cause of colon polyps is not known. What increases the risk? This condition is more likely to develop in people who:  Have a family history of colon cancer or colon polyps.  Are older than 62 or older than 45 if they are African American.  Have  inflammatory bowel disease, such as ulcerative colitis or Crohn disease.  Are overweight.  Smoke cigarettes.  Do not get enough exercise.  Drink too much alcohol.  Eat a diet that is: ? High in fat and red meat. ? Low in fiber.  Had childhood cancer that was treated with abdominal radiation.  What are the signs or symptoms? Most polyps do not cause symptoms. If you have symptoms, they may include:  Blood coming from your rectum when having a bowel movement.  Blood in your stool.The stool may look dark red or black.  A change in bowel habits, such as constipation or diarrhea.  How is this diagnosed? This condition is diagnosed with a colonoscopy. This is a procedure that uses a lighted, flexible scope to look at the inside of your colon. How is this treated? Treatment for this  condition involves removing any polyps that are found. Those polyps will then be tested for cancer. If cancer is found, your health care provider will talk to you about options for colon cancer treatment. Follow these instructions at home: Diet  Eat plenty of fiber, such as fruits, vegetables, and whole grains.  Eat foods that are high in calcium and vitamin D, such as milk, cheese, yogurt, eggs, liver, fish, and broccoli.  Limit foods high in fat, red meats, and processed meats, such as hot dogs, sausage, bacon, and lunch meats.  Maintain a healthy weight, or lose weight if recommended by your health care provider. General instructions  Do not smoke cigarettes.  Do not drink alcohol excessively.  Keep all follow-up visits as told by your health care provider. This is important. This includes keeping regularly scheduled colonoscopies. Talk to your health care provider about when you need a colonoscopy.  Exercise every day or as told by your health care provider. Contact a health care provider if:  You have new or worsening bleeding during a bowel movement.  You have new or increased blood in your stool.  You have a change in bowel habits.  You unexpectedly lose weight. This information is not intended to replace advice given to you by your health care provider. Make sure you discuss any questions you have with your health care provider. Document Released: 06/02/2004 Document Revised: 02/12/2016 Document Reviewed: 07/28/2015 Elsevier Interactive Patient Education  2018 [employer].  Hemorrhoids Hemorrhoids are swollen veins in and around the rectum or anus. There are two types of hemorrhoids:  Internal hemorrhoids. These occur in the veins that are just inside the rectum. They may poke through to the outside and become irritated and painful.  External hemorrhoids. These occur in the veins that are outside of the anus and can be felt as a painful swelling or hard lump near the  anus.  Most hemorrhoids do not cause serious problems, and they can be managed with home treatments such as diet and lifestyle changes. If home treatments do not help your symptoms, procedures can be done to shrink or remove the hemorrhoids. What are the causes? This condition is caused by increased pressure in the anal area. This pressure may result from various things, including:  Constipation.  Straining to have a bowel movement.  Diarrhea.  Pregnancy.  Obesity.  Sitting for long periods of time.  Heavy lifting or other activity that causes you to strain.  Anal sex.  What are the signs or symptoms? Symptoms of this condition include:  Pain.  Anal itching or irritation.  Rectal bleeding.  Leakage of stool (feces).  Anal  swelling.  One or more lumps around the anus.  How is this diagnosed? This condition can often be diagnosed through a visual exam. Other exams or tests may also be done, such as:  Examination of the rectal area with a gloved hand (digital rectal exam).  Examination of the anal canal using a small tube (anoscope).  A blood test, if you have lost a significant amount of blood.  A test to look inside the colon (sigmoidoscopy or colonoscopy).  How is this treated? This condition can usually be treated at home. However, various procedures may be done if dietary changes, lifestyle changes, and other home treatments do not help your symptoms. These procedures can help make the hemorrhoids smaller or remove them completely. Some of these procedures involve surgery, and others do not. Common procedures include:  Rubber band ligation. Rubber bands are placed at the base of the hemorrhoids to cut off the blood supply to them.  Sclerotherapy. Medicine is injected into the hemorrhoids to shrink them.  Infrared coagulation. A type of light energy is used to get rid of the hemorrhoids.  Hemorrhoidectomy surgery. The hemorrhoids are surgically removed, and  the veins that supply them are tied off.  Stapled hemorrhoidopexy surgery. A circular stapling device is used to remove the hemorrhoids and use staples to cut off the blood supply to them.  Follow these instructions at home: Eating and drinking  Eat foods that have a lot of fiber in them, such as whole grains, beans, nuts, fruits, and vegetables. Ask your health care provider about taking products that have added fiber (fiber supplements).  Drink enough fluid to keep your urine clear or pale yellow. Managing pain and swelling  Take warm sitz baths for 20 minutes, 3-4 times a day to ease pain and discomfort.  If directed, apply ice to the affected area. Using ice packs between sitz baths may be helpful. ? Put ice in a plastic bag. ? Place a towel between your skin and the bag. ? Leave the ice on for 20 minutes, 2-3 times a day. General instructions  Take over-the-counter and prescription medicines only as told by your health care provider.  Use medicated creams or suppositories as told.  Exercise regularly.  Go to the bathroom when you have the urge to have a bowel movement. Do not wait.  Avoid straining to have bowel movements.  Keep the anal area dry and clean. Use wet toilet paper or moist towelettes after a bowel movement.  Do not sit on the toilet for long periods of time. This increases blood pooling and pain. Contact a health care provider if:  You have increasing pain and swelling that are not controlled by treatment or medicine.  You have uncontrolled bleeding.  You have difficulty having a bowel movement, or you are unable to have a bowel movement.  You have pain or inflammation outside the area of the hemorrhoids. This information is not intended to replace advice given to you by your health care provider. Make sure you discuss any questions you have with your health care provider. Document Released: 09/03/2000 Document Revised: 02/04/2016 Document Reviewed:  05/21/2015 Elsevier Interactive Patient Education  [employer].

## 2017-09-09 NOTE — Op Note (Signed)
[facility] Patient Name: Darius Smith Procedure Date: 09/09/2017 10:54 AM MRN: [id number] Date of Birth: 07/11/1946 Attending MD: Hildred Laser , MD CSN: [id number] Age: 71 Admit Type: Outpatient Procedure:                Colonoscopy Indications:              Rectal bleeding; h/o colonic polyps. Providers:                Hildred Laser, MD, Jeanann Lewandowsky. Sharon Seller, RN, Nelma Rothman, Technician, Randa Spike, Technician Referring MD:             Jasper Loser. Luan Pulling, MD Medicines:                Meperidine 50 mg IV, Midazolam 6 mg IV Complications:            No immediate complications. Estimated Blood Loss:     Estimated blood loss was minimal. Procedure:                Pre-Anesthesia Assessment:                           - Prior to the procedure, a History and Physical                            was performed, and patient medications and                            allergies were reviewed. The patient's tolerance of                            previous anesthesia was also reviewed. The risks                            and benefits of the procedure and the sedation                            options and risks were discussed with the patient.                            All questions were answered, and informed consent                            was obtained. Prior Anticoagulants: The patient has                            taken no previous anticoagulant or antiplatelet                            agents. ASA Grade Assessment: II - A patient with                            mild systemic disease. After reviewing the risks  and benefits, the patient was deemed in                            satisfactory condition to undergo the procedure.                           After obtaining informed consent, the colonoscope                            was passed under direct vision. Throughout the                            procedure, the patient's blood  pressure, pulse, and                            oxygen saturations were monitored continuously. The                            EC-[id number] ([id number]) scope was introduced through                            the anus and advanced to the the cecum, identified                            by appendiceal orifice and ileocecal valve. The                            colonoscopy was performed without difficulty. The                            patient tolerated the procedure well. The ileocecal                            valve, appendiceal orifice, and rectum were                            photographed. The quality of the bowel preparation                            was good. Scope In: 11:34:04 AM Scope Out: 12:00:09 PM Scope Withdrawal Time: 0 hours 14 minutes 50 seconds  Total Procedure Duration: 0 hours 26 minutes 5 seconds  Findings:      The perianal and digital rectal examinations were normal.      Two sessile polyps were found in the hepatic flexure and ascending       colon. The polyps were small in size. These polyps were removed with a       cold snare. Resection and retrieval were complete. The pathology       specimen was placed into Bottle Number 1.      A 7 mm polyp was found in the recto-sigmoid colon. The polyp was       semi-pedunculated. The polyp was removed with a hot snare. Resection and       retrieval were complete. The pathology specimen was placed into Bottle  Number 1.      Multiple small and large-mouthed diverticula were found in the sigmoid       colon.      External hemorrhoids were found during retroflexion. The hemorrhoids       were medium-sized. Impression:               - Two small polyps at the hepatic flexure and in                            the ascending colon, removed with a cold snare.                            Resected and retrieved.                           - One 7 mm polyp at the recto-sigmoid colon,                            removed with a hot  snare. Resected and retrieved.                           - Diverticulosis in the sigmoid colon.                           - External hemorrhoids. Moderate Sedation:      Moderate (conscious) sedation was administered by the endoscopy nurse       and supervised by the endoscopist. The following parameters were       monitored: oxygen saturation, heart rate, blood pressure, CO2       capnography and response to care. Total physician intraservice time was       32 minutes. Recommendation:           - Patient has a contact number available for                            emergencies. The signs and symptoms of potential                            delayed complications were discussed with the                            patient. Return to normal activities tomorrow.                            Written discharge instructions were provided to the                            patient.                           - High fiber diet today.                           - Continue present medications.                           -  No aspirin, ibuprofen, naproxen, or other                            non-steroidal anti-inflammatory drugs for 7 days                            after polyp removal.                           - Await pathology results.                           - Repeat colonoscopy in 5 years for surveillance. Procedure Code(s):        --- Professional ---                           [id number], Colonoscopy, flexible; with removal of                            tumor(s), polyp(s), or other lesion(s) by snare                            technique                           99152, Moderate sedation services provided by the                            same physician or other qualified health care                            professional performing the diagnostic or                            therapeutic service that the sedation supports,                            requiring the presence of an independent trained                             observer to assist in the monitoring of the                            patient's level of consciousness and physiological                            status; initial 15 minutes of intraservice time,                            patient age 16 years or older                           [id number], Moderate sedation services; each additional  15 minutes intraservice time Diagnosis Code(s):        --- Professional ---                           D12.3, Benign neoplasm of transverse colon (hepatic                            flexure or splenic flexure)                           D12.2, Benign neoplasm of ascending colon                           D12.7, Benign neoplasm of rectosigmoid junction                           K64.4, Residual hemorrhoidal skin tags                           K62.5, Hemorrhage of anus and rectum                           K57.30, Diverticulosis of large intestine without                            perforation or abscess without bleeding CPT copyright 2016 American [employer]. All rights reserved. The codes documented in this report are preliminary and upon coder review may  be revised to meet current compliance requirements. Hildred Laser, MD Hildred Laser, MD 09/09/2017 12:09:46 PM This report has been signed electronically. Number of Addenda: 0

## 2017-09-15 ENCOUNTER — Encounter (HOSPITAL_COMMUNITY): Payer: Self-pay | Admitting: Internal Medicine

## 2017-09-16 DIAGNOSIS — N201 Calculus of ureter: Secondary | ICD-10-CM | POA: Diagnosis not present

## 2017-09-16 DIAGNOSIS — N4341 Spermatocele of epididymis, single: Secondary | ICD-10-CM | POA: Diagnosis not present

## 2017-09-16 DIAGNOSIS — N3000 Acute cystitis without hematuria: Secondary | ICD-10-CM | POA: Diagnosis not present

## 2017-10-04 DIAGNOSIS — H5203 Hypermetropia, bilateral: Secondary | ICD-10-CM | POA: Diagnosis not present

## 2017-10-17 DIAGNOSIS — N130 Hydronephrosis with ureteropelvic junction obstruction: Secondary | ICD-10-CM | POA: Diagnosis not present

## 2017-10-17 DIAGNOSIS — N4341 Spermatocele of epididymis, single: Secondary | ICD-10-CM | POA: Diagnosis not present

## 2017-10-17 DIAGNOSIS — N201 Calculus of ureter: Secondary | ICD-10-CM | POA: Diagnosis not present

## 2017-10-19 DIAGNOSIS — N130 Hydronephrosis with ureteropelvic junction obstruction: Secondary | ICD-10-CM | POA: Diagnosis not present

## 2017-10-19 DIAGNOSIS — N200 Calculus of kidney: Secondary | ICD-10-CM | POA: Diagnosis not present

## 2017-10-28 ENCOUNTER — Encounter (HOSPITAL_COMMUNITY): Payer: Self-pay | Admitting: Emergency Medicine

## 2017-10-28 ENCOUNTER — Emergency Department (HOSPITAL_COMMUNITY): Payer: Medicare Other

## 2017-10-28 ENCOUNTER — Other Ambulatory Visit: Payer: Self-pay

## 2017-10-28 ENCOUNTER — Emergency Department (HOSPITAL_COMMUNITY)
Admission: EM | Admit: 2017-10-28 | Discharge: 2017-10-28 | Disposition: A | Discharge status: Home / Self Care | Payer: Medicare Other | Attending: Emergency Medicine | Admitting: Emergency Medicine

## 2017-10-28 DIAGNOSIS — I4891 Unspecified atrial fibrillation: Secondary | ICD-10-CM

## 2017-10-28 DIAGNOSIS — I480 Paroxysmal atrial fibrillation: Secondary | ICD-10-CM | POA: Insufficient documentation

## 2017-10-28 DIAGNOSIS — I10 Essential (primary) hypertension: Secondary | ICD-10-CM | POA: Insufficient documentation

## 2017-10-28 DIAGNOSIS — N201 Calculus of ureter: Secondary | ICD-10-CM | POA: Diagnosis not present

## 2017-10-28 DIAGNOSIS — I517 Cardiomegaly: Secondary | ICD-10-CM

## 2017-10-28 DIAGNOSIS — Z9889 Other specified postprocedural states: Secondary | ICD-10-CM | POA: Diagnosis not present

## 2017-10-28 DIAGNOSIS — Z87442 Personal history of urinary calculi: Secondary | ICD-10-CM | POA: Insufficient documentation

## 2017-10-28 DIAGNOSIS — R000 Tachycardia, unspecified: Secondary | ICD-10-CM | POA: Diagnosis not present

## 2017-10-28 DIAGNOSIS — Z7982 Long term (current) use of aspirin: Secondary | ICD-10-CM | POA: Diagnosis not present

## 2017-10-28 HISTORY — DX: Cardiomegaly: I51.7

## 2017-10-28 HISTORY — DX: Unspecified atrial fibrillation: I48.91

## 2017-10-28 LAB — PROTIME-INR
INR: 1.01
Prothrombin Time: 13.2 seconds (ref 11.4–15.2)

## 2017-10-28 LAB — MAGNESIUM: Magnesium: 2 mg/dL (ref 1.7–2.4)

## 2017-10-28 LAB — BASIC METABOLIC PANEL
Anion gap: 13 (ref 5–15)
BUN: 14 mg/dL (ref 6–20)
CO2: 22 mmol/L (ref 22–32)
Calcium: 8.7 mg/dL — ABNORMAL LOW (ref 8.9–10.3)
Chloride: 104 mmol/L (ref 101–111)
Creatinine, Ser: 1.4 mg/dL — ABNORMAL HIGH (ref 0.61–1.24)
GFR calc Af Amer: 57 mL/min — ABNORMAL LOW (ref >60)
GFR calc non Af Amer: 49 mL/min — ABNORMAL LOW (ref >60)
Glucose, Bld: 120 mg/dL — ABNORMAL HIGH (ref 65–99)
Potassium: 3.8 mmol/L (ref 3.5–5.1)
Sodium: 139 mmol/L (ref 135–145)

## 2017-10-28 LAB — CBC
HCT: 45.1 % (ref 39.0–52.0)
Hemoglobin: 15 g/dL (ref 13.0–17.0)
MCH: 29.7 pg (ref 26.0–34.0)
MCHC: 33.3 g/dL (ref 30.0–36.0)
MCV: 89.3 fL (ref 78.0–100.0)
Platelets: 254 10*3/uL (ref 150–400)
RBC: 5.05 MIL/uL (ref 4.22–5.81)
RDW: 12.7 % (ref 11.5–15.5)
WBC: 9.6 10*3/uL (ref 4.0–10.5)

## 2017-10-28 LAB — PHOSPHORUS: Phosphorus: 2.8 mg/dL (ref 2.5–4.6)

## 2017-10-28 LAB — TROPONIN I: Troponin I: <0.03 ng/mL (ref <0.03)

## 2017-10-28 MED ORDER — ASPIRIN 81 MG PO CHEW
81.0000 mg | CHEWABLE_TABLET | Freq: Once | ORAL | Status: AC
  Administered 2017-10-28: 81 mg via ORAL
  Filled 2017-10-28: qty 1

## 2017-10-28 MED ORDER — SODIUM CHLORIDE 0.9 % IV SOLN
Freq: Once | INTRAVENOUS | Status: AC
  Administered 2017-10-28: 18:00:00 via INTRAVENOUS

## 2017-10-28 MED ORDER — MIDAZOLAM HCL 2 MG/2ML IJ SOLN
1.0000 mg | Freq: Once | INTRAMUSCULAR | Status: AC
  Administered 2017-10-28: 1 mg via INTRAVENOUS
  Filled 2017-10-28: qty 2

## 2017-10-28 MED ORDER — ETOMIDATE 2 MG/ML IV SOLN
0.1500 mg/kg | Freq: Once | INTRAVENOUS | Status: AC
  Administered 2017-10-28: 10 mg via INTRAVENOUS
  Filled 2017-10-28: qty 10

## 2017-10-28 MED ORDER — ASPIRIN 81 MG PO CHEW: 81.0000 mg | CHEWABLE_TABLET | Freq: Every day | ORAL | 0 refills | Status: DC

## 2017-10-28 MED ORDER — PROPOFOL 10 MG/ML IV BOLUS
0.5000 mg/kg | Freq: Once | INTRAVENOUS | Status: DC
  Filled 2017-10-28: qty 20

## 2017-10-28 NOTE — ED Triage Notes (Signed)
Pt arrives via [facility] from [place] with new onset afib s/p procedure, denies weakness, lightheadedness, denies pain.  Resp e/u, AOx4.

## 2017-10-28 NOTE — ED Provider Notes (Signed)
[facility] EMERGENCY DEPARTMENT Provider Note   CSN: [id number] Arrival date & time: 10/28/17  1517     History   Chief Complaint Chief Complaint  Patient presents with  . Atrial Fibrillation    HPI Darius Smith is a 72 y.o. male.  HPI Patient was at the [facility] surgery center for kidney stone removal.  He reports that they were unable to remove the stone and a stent was placed.  As the patient was coming out of anesthesia he developed atrial fibrillation with rapid ventricular response.  This was new onset for him.  He has no prior history of atrial fibrillation.  He denies any chest pain, shortness of breath, lightheadedness.  He denies any prior history of exertional chest pain or dyspnea.  No prior pulmonary disease.  He only takes medications for reflux and hypertension.  Patient had rectal bleeding last year for which she had follow-up colonoscopy and identified diverticulosis.  No further bleeding and no easy bleeding.  Patient last ate yesterday evening.  No prior adverse effect with anesthesia. Past Medical History:  Diagnosis Date  . Abdominal hernia   . Arthritis   . GERD (gastroesophageal reflux disease)   . History of kidney stones   . Hypertension   . Shortness of breath     Patient Active Problem List   Diagnosis Date Noted  . Rectal bleeding 09/02/2017  . Essential hypertension 08/08/2014  . GERD (gastroesophageal reflux disease) 08/08/2014    Past Surgical History:  Procedure Laterality Date  . CHOLECYSTECTOMY    . COLONOSCOPY  04/28/2011   Procedure: COLONOSCOPY;  Surgeon: Rogene Houston, MD;  [facility];  Service: Endoscopy;  Laterality: N/A;  . COLONOSCOPY N/A 07/18/2014   Procedure: COLONOSCOPY;  Surgeon: Rogene Houston, MD;  [facility];  Service: Endoscopy;  Laterality: N/A;  830  . COLONOSCOPY N/A 09/09/2017   Procedure: COLONOSCOPY;  Surgeon: Rogene Houston, MD;  [facility];  Service:  Endoscopy;  Laterality: N/A;  955  . HERNIA REPAIR    . POLYPECTOMY  09/09/2017   Procedure: POLYPECTOMY;  Surgeon: Rogene Houston, MD;  [facility];  Service: Endoscopy;;  colon  . vocal cord surgery         Home Medications    Prior to Admission medications   Medication Sig Start Date End Date Taking? Authorizing Provider  acetaminophen (TYLENOL) 500 MG tablet Take 1,000 mg by mouth every 6 (six) hours as needed for moderate pain or headache.   Yes [provider]  amLODipine (NORVASC) 10 MG tablet Take 10 mg by mouth daily. 08/12/17  Yes [provider]  benazepril (LOTENSIN) 40 MG tablet Take 40 mg by mouth daily. 08/12/17  Yes [provider]  cetirizine (ZYRTEC) 10 MG tablet Take 10 mg by mouth daily as needed for allergies.   Yes [provider]  omeprazole (PRILOSEC) 20 MG capsule Take 20 mg by mouth daily as needed (for heartburn).    Yes [provider]  tamsulosin (FLOMAX) 0.4 MG CAPS capsule Take 1 capsule (0.4 mg total) by mouth daily. 08/31/17  Yes Sherwood Gambler, MD  VENTOLIN HFA 108 (90 Base) MCG/ACT inhaler Inhale 2 puffs into the lungs every 6 (six) hours as needed for wheezing or shortness of breath.  08/01/17  Yes [provider]  aspirin 81 MG chewable tablet Chew 1 tablet (81 mg total) by mouth daily. 10/28/17   Charlesetta Shanks, MD  Family History Family History  Problem Relation Age of Onset  . Breast cancer Mother   . Prostate cancer Father   . COPD Sister     Social History Social History   Tobacco Use  . Smoking status: Never Smoker  . Smokeless tobacco: Never Used  Substance Use Topics  . Alcohol use: Yes    Alcohol/week: 4.2 oz    Types: 7 Cans of beer per week  . Drug use: No     Allergies   Augmentin [amoxicillin-pot clavulanate]   Review of Systems Review of Systems 10 Systems reviewed and are negative for acute change except as noted in the HPI.  Physical  Exam Updated Vital Signs BP 120/66   Pulse 66   Temp 97.6 F (36.4 C) (Oral)   Resp 16   Wt 90 kg (198 lb 6.6 oz)   SpO2 95%   BMI 26.91 kg/m   Physical Exam  Constitutional: He is oriented to person, place, and time. He appears well-developed and well-nourished. No distress.  HENT:  Head: Normocephalic and atraumatic.  Nose: Nose normal.  Mouth/Throat: Oropharynx is clear and moist.  Posterior oropharynx widely patent.  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  Uvula and posterior tonsillar pillars mildly erythematous and slightly edematous.  Voice is clear.  Eyes: Conjunctivae and EOM are normal.  Neck: Neck supple.  Cardiovascular:  No murmur heard. Tachycardia, irregularly irregular.  Pulmonary/Chest: Effort normal and breath sounds normal. No respiratory distress.  Abdominal: Soft. There is no tenderness.  Musculoskeletal: Normal range of motion. He exhibits no edema or tenderness.  Neurological: He is alert and oriented to person, place, and time. No cranial nerve deficit. He exhibits normal muscle tone. Coordination normal.  Skin: Skin is warm and dry.  Psychiatric: He has a normal mood and affect.  Nursing note and vitals reviewed.    ED Treatments / Results  Labs (all labs ordered are listed, but only abnormal results are displayed) Labs Reviewed  BASIC METABOLIC PANEL - Abnormal; Notable for the following components:      Result Value   Glucose, Bld 120 (*)    Creatinine, Ser 1.40 (*)    Calcium 8.7 (*)    GFR calc non Af Amer 49 (*)    GFR calc Af Amer 57 (*)    All other components within normal limits  CBC  PROTIME-INR  MAGNESIUM  PHOSPHORUS  TROPONIN I    EKG  EKG Interpretation  Date/Time:  Friday October 28 2017 18:37:40 EST Ventricular Rate:  88 PR Interval:    QRS Duration: 98 QT Interval:  368 QTC Calculation: 446 R Axis:   10 Text Interpretation:  Sinus rhythm post cardioversion. resolved afib. no  ischemic changes Confirmed by Charlesetta Shanks [phone]) on 10/28/2017 6:59:15 PM       Radiology Dg Chest 2 View  Result Date: 10/28/2017 CLINICAL DATA:  Acute onset atrial fibrillation after surgery to remove kidney stones today. EXAM: CHEST  2 VIEW COMPARISON:  PA and lateral chest 08/06/2005. FINDINGS: There is cardiomegaly without edema. Lungs clear. No pneumothorax or pleural effusion. No acute bony abnormality. IMPRESSION: Cardiomegaly without acute disease. Electronically Signed   By: Inge Rise M.D.   On: 10/28/2017 15:43    Procedures CRITICAL CARE Performed by: Si Gaul   Total critical care time: 30 minutes  Critical care time was exclusive of separately billable procedures and treating other patients.  Critical care was necessary to treat or  prevent imminent or life-threatening deterioration.  Critical care was time spent personally by me on the following activities: development of treatment plan with patient and/or surrogate as well as nursing, discussions with consultants, evaluation of patient's response to treatment, examination of patient, obtaining history from patient or surrogate, ordering and performing treatments and interventions, ordering and review of laboratory studies, ordering and review of radiographic studies, pulse oximetry and re-evaluation of patient's condition.  .Sedation Date/Time: 10/28/2017 5:19 PM Performed by: Charlesetta Shanks, MD Authorized by: Charlesetta Shanks, MD   Consent:    Consent obtained:  Verbal and written   Consent given by:  Patient   Risks discussed:  Allergic reaction, dysrhythmia, inadequate sedation, nausea, prolonged hypoxia resulting in organ damage, prolonged sedation necessitating reversal, respiratory compromise necessitating ventilatory assistance and intubation and vomiting Universal protocol:    Procedure explained and questions answered to patient or proxy's satisfaction: yes     Relevant documents present and  verified: yes     Immediately prior to procedure a time out was called: yes     Patient identity confirmation method:  Arm band Indications:    Procedure performed:  Cardioversion   Procedure necessitating sedation performed by:  Physician performing sedation   Intended level of sedation:  Moderate (conscious sedation) Pre-sedation assessment:    Time since last food or drink:  After midnight 2/7   ASA classification: class 2 - patient with mild systemic disease     Neck mobility: normal     Mouth opening:  3 or more finger widths   Thyromental distance:  3 finger widths   Mallampati score:  II - soft palate, uvula, fauces visible   Pre-sedation assessments completed and reviewed: airway patency, cardiovascular function, hydration status, mental status, nausea/vomiting, pain level, respiratory function and temperature     Pre-sedation assessment completed:  10/28/2017 5:20 PM Immediate pre-procedure details:    Reassessment: Patient reassessed immediately prior to procedure     Reviewed: vital signs, relevant labs/tests and NPO status     Verified: bag valve mask available, emergency equipment available, intubation equipment available, IV patency confirmed, oxygen available, reversal medications available and suction available   Procedure details (see MAR for exact dosages):    Preoxygenation:  Nasal cannula   Sedation:  Etomidate and midazolam   Intra-procedure monitoring:  Blood pressure monitoring, cardiac monitor, continuous capnometry, continuous pulse oximetry, frequent LOC assessments and frequent vital sign checks   Intra-procedure events: hypoxia     Intra-procedure management:  Airway repositioning and BVM ventilation   Total Provider sedation time (minutes):  20 Post-procedure details:    Post-sedation assessment completed:  10/28/2017 6:53 PM   Attendance: Constant attendance by certified staff until patient recovered     Recovery: Patient returned to pre-procedure baseline      Post-sedation assessments completed and reviewed: airway patency, cardiovascular function, hydration status, mental status, nausea/vomiting, pain level, respiratory function and temperature     Patient is stable for discharge or admission: yes     Patient tolerance:  Tolerated well, no immediate complications Comments:     Patient was cardioverted with 100 J synchronized.  One shock required for conversion to sinus rhythm in the 80s.  After cardioversion patient had mild oxygen desaturation to 88%.  Supported temporarily with bag valve mask and supplemental O2.  Return to normal baseline.  No emesis or airway compromise.  .Cardioversion Date/Time: 10/28/2017 6:54 PM Performed by: Charlesetta Shanks, MD Authorized by: Charlesetta Shanks, MD   Consent:  Consent obtained:  Written   Consent given by:  Patient   Alternatives discussed:  No treatment, rate-control medication, alternative treatment and referral Pre-procedure details:    Cardioversion basis:  Elective   Rhythm:  Atrial fibrillation   Electrode placement:  Anterior-posterior Patient sedated: Yes. Refer to sedation procedure documentation for details of sedation.  Attempt one:    Cardioversion mode:  Synchronous   Waveform:  Biphasic   Shock (Joules):  100   Shock outcome:  Conversion to normal sinus rhythm Post-procedure details:    Patient status:  Awake   Patient tolerance of procedure:  Tolerated well, no immediate complications   (including critical care time) This patients CHA2DS2-VASc Score and unadjusted Ischemic Stroke Rate (% per year) is equal to 2.  Above score calculated as 1 point each if present [CHF, HTN, DM, Vascular=MI/PAD/Aortic Plaque, Age if 65-74, or Male] Above score calculated as 2 points each if present [Age > 75, or Stroke/TIA/TE]     Medications Ordered in ED Medications  aspirin chewable tablet 81 mg (not administered)  midazolam (VERSED) injection 1 mg (1 mg Intravenous Given by Other 10/28/17  1831)  etomidate (AMIDATE) injection 13.5 mg (10 mg Intravenous Given by Other 10/28/17 1835)  0.9 %  sodium chloride infusion ( Intravenous Stopped 10/28/17 1959)     Initial Impression / Assessment and Plan / ED Course  I have reviewed the triage vital signs and the nursing notes.  Pertinent labs & imaging results that were available during my care of the patient were reviewed by me and considered in my medical decision making (see chart for details).      Consult: Cardiology Dr. Radford Pax.  Agrees patient is appropriate candidate for proceeding with cardioversion in the emergency department. Final Clinical Impressions(s) / ED Diagnoses   Final diagnoses:  Atrial fibrillation with RVR ([place])  New onset atrial fibrillation [facility])  Patient had acute onset of atrial fibrillation after elective outpatient procedure for kidney stones.  Patient had rapid ventricular response rates to 140s.  He did not have associated chest pain, dyspnea or syncope.  Patient did not have any pre-existing symptoms of atrial fibrillation.  He otherwise has limited associated medical problem of hypertension and age.  Patient had clinically stable blood pressure and respiratory status.  He was a good candidate for cardioversion.  This was reviewed with Dr. Golden Hurter who agreed with proceeding with procedure.  He tolerated cardioversion well with immediate conversion to sinus rhythm which stayed after a period of observation.  He is asymptomatic and clinically well in appearance.  Is counseled on close follow-up with cardiology for further cardiac evaluation.  Return precautions reviewed.  ED Discharge Orders        Ordered    aspirin 81 MG chewable tablet  Daily     10/28/17 1952       Charlesetta Shanks, MD 10/28/17 2003

## 2017-10-28 NOTE — ED Notes (Signed)
Family at bedside. 

## 2017-10-28 NOTE — Discharge Instructions (Signed)

## 2017-10-28 NOTE — ED Notes (Signed)
Pt stable, ambulatory, states understanding of discharge instructions 

## 2017-10-28 NOTE — ED Notes (Signed)
Patient transported to X-ray 

## 2017-11-14 ENCOUNTER — Encounter: Payer: Self-pay | Admitting: Cardiology

## 2017-11-14 ENCOUNTER — Ambulatory Visit: Payer: Medicare Other | Admitting: Cardiology

## 2017-11-14 ENCOUNTER — Telehealth: Payer: Self-pay | Admitting: Cardiology

## 2017-11-14 VITALS — BP 140/80 | HR 68 | Ht 72.0 in | Wt 198.8 lb

## 2017-11-14 DIAGNOSIS — Z01810 Encounter for preprocedural cardiovascular examination: Secondary | ICD-10-CM

## 2017-11-14 DIAGNOSIS — I4891 Unspecified atrial fibrillation: Secondary | ICD-10-CM

## 2017-11-14 NOTE — Telephone Encounter (Signed)
Pre-cert Verification for the following procedure   Echo scheduled for 12-08-17

## 2017-11-14 NOTE — Patient Instructions (Signed)
Your physician recommends that you schedule a follow-up appointment in: [place] recommends that you continue on your current medications as directed. Please refer to the Current Medication list given to you today.  Your physician has requested that you have an echocardiogram. Echocardiography is a painless test that uses sound waves to create images of your heart. It provides your doctor with information about the size and shape of your heart and how well your heart's chambers and valves are working. This procedure takes approximately one hour. There are no restrictions for this procedure.  Your physician has recommended that you wear an event monitor FOR 30 DAYS. Event monitors are medical devices that record the heart's electrical activity. Doctors most often [place] these monitors to diagnose arrhythmias. Arrhythmias are problems with the speed or rhythm of the heartbeat. The monitor is a small, portable device. You can wear one while you do your normal daily activities. This is usually used to diagnose what is causing palpitations/syncope (passing out).

## 2017-11-14 NOTE — Progress Notes (Addendum)
Clinical Summary [name] is a 72 y.o.male seen as new consult, referred by Dr Darius Smith for atrial fibrillation.   1. Afib - seen in ER 10/28/17. From ER notes new onset afib developed after recent urology procedure. - prior history of rectal bleeding last year that resolved - received DCCV in ER  - no recent palpitations. No SOB or dizziness - CHADS2Vasc score is 2, has not been committed to anticoagulation   2. Kidney stones - followed by urology - recent stent placement  - needs a repeat procedure   3. Presurgical evaluation - does heavy work cutting and moving wood. Walks dog 1 mile daily.   Past Medical History:  Diagnosis Date  . Abdominal hernia   . Arthritis   . GERD (gastroesophageal reflux disease)   . History of kidney stones   . Hypertension   . Shortness of breath      Allergies  Allergen Reactions  . Augmentin [Amoxicillin-Pot Clavulanate] Other (See Comments)    Increased liver enzymes Has patient had a PCN reaction causing immediate rash, facial/tongue/throat swelling, SOB or lightheadedness with hypotension: No Has patient had a PCN reaction causing severe rash involving mucus membranes or skin necrosis: No Has patient had a PCN reaction that required hospitalization: No Has patient had a PCN reaction occurring within the last 10 years: No  If all of the above answers are "NO", then may proceed with Cephalosporin use.      Current Outpatient Medications  Medication Sig Dispense Refill  . acetaminophen (TYLENOL) 500 MG tablet Take 1,000 mg by mouth every 6 (six) hours as needed for moderate pain or headache.    Marland Kitchen amLODipine (NORVASC) 10 MG tablet Take 10 mg by mouth daily.  0  . aspirin 81 MG chewable tablet Chew 1 tablet (81 mg total) by mouth daily. 30 tablet 0  . benazepril (LOTENSIN) 40 MG tablet Take 40 mg by mouth daily.  11  . cetirizine (ZYRTEC) 10 MG tablet Take 10 mg by mouth daily as needed for allergies.    Marland Kitchen omeprazole (PRILOSEC)  20 MG capsule Take 20 mg by mouth daily as needed (for heartburn).     . tamsulosin (FLOMAX) 0.4 MG CAPS capsule Take 1 capsule (0.4 mg total) by mouth daily. 10 capsule 0  . VENTOLIN HFA 108 (90 Base) MCG/ACT inhaler Inhale 2 puffs into the lungs every 6 (six) hours as needed for wheezing or shortness of breath.   11   No current facility-administered medications for this visit.      Past Surgical History:  Procedure Laterality Date  . CHOLECYSTECTOMY    . COLONOSCOPY  04/28/2011   Procedure: COLONOSCOPY;  Surgeon: Rogene Houston, MD;  [facility];  Service: Endoscopy;  Laterality: N/A;  . COLONOSCOPY N/A 07/18/2014   Procedure: COLONOSCOPY;  Surgeon: Rogene Houston, MD;  [facility];  Service: Endoscopy;  Laterality: N/A;  830  . COLONOSCOPY N/A 09/09/2017   Procedure: COLONOSCOPY;  Surgeon: Rogene Houston, MD;  [facility];  Service: Endoscopy;  Laterality: N/A;  955  . HERNIA REPAIR    . POLYPECTOMY  09/09/2017   Procedure: POLYPECTOMY;  Surgeon: Rogene Houston, MD;  [facility];  Service: Endoscopy;;  colon  . vocal cord surgery       Allergies  Allergen Reactions  . Augmentin [Amoxicillin-Pot Clavulanate] Other (See Comments)    Increased liver enzymes Has patient had a PCN reaction causing  immediate rash, facial/tongue/throat swelling, SOB or lightheadedness with hypotension: No Has patient had a PCN reaction causing severe rash involving mucus membranes or skin necrosis: No Has patient had a PCN reaction that required hospitalization: No Has patient had a PCN reaction occurring within the last 10 years: No  If all of the above answers are "NO", then may proceed with Cephalosporin use.       Family History  Problem Relation Age of Onset  . Breast cancer Mother   . Prostate cancer Father   . COPD Sister      Social History [name] reports that  has never smoked. he has never used smokeless tobacco. [name]  reports that he drinks about 4.2 oz of alcohol per week.   Review of Systems CONSTITUTIONAL: No weight loss, fever, chills, weakness or fatigue.  HEENT: Eyes: No visual loss, blurred vision, double vision or yellow sclerae.No hearing loss, sneezing, congestion, runny nose or sore throat.  SKIN: No rash or itching.  CARDIOVASCULAR: per hpi RESPIRATORY: No shortness of breath, cough or sputum.  GASTROINTESTINAL: No anorexia, nausea, vomiting or diarrhea. No abdominal pain or blood.  GENITOURINARY: No burning on urination, no polyuria NEUROLOGICAL: No headache, dizziness, syncope, paralysis, ataxia, numbness or tingling in the extremities. No change in bowel or bladder control.  MUSCULOSKELETAL: No muscle, back pain, joint pain or stiffness.  LYMPHATICS: No enlarged nodes. No history of splenectomy.  PSYCHIATRIC: No history of depression or anxiety.  ENDOCRINOLOGIC: No reports of sweating, cold or heat intolerance. No polyuria or polydipsia.  Marland Kitchen   Physical Examination Vitals:   11/14/17 1335  BP: 140/80  Pulse: 68  SpO2: 98%   Vitals:   11/14/17 1335  Weight: 198 lb 12.8 oz (90.2 kg)  Height: 6' (1.829 m)    Gen: resting comfortably, no acute distress HEENT: no scleral icterus, pupils equal round and reactive, no palptable cervical adenopathy,  CV: RRR, no m/r/g, no jvd Resp: Clear to auscultation bilaterally GI: abdomen is soft, non-tender, non-distended, normal bowel sounds, no hepatosplenomegaly MSK: extremities are warm, no edema.  Skin: warm, no rash Neuro:  no focal deficits Psych: appropriate affect     Assessment and Plan  1. Afib - new diagnosis, occurred s/p urology procedure. Unclear if provoked by surgery or previously has been occurring - EKG today shows NSR - we will plan for 30 day event monitor to evaluate for recurrence. If noted would need to commit to anticoagulation  - in setting of new onset afib obtain echo  2. Preoperative evaluation - tolerates  greater than 4 METs without limitation - afib is not a contraindciation for surgery. Since he is having an echo for his afib, would suggest awaiting results until undergoing procedure     Arnoldo Lenis, M.D.   11/24/17 addendum Normal echo, recommend proceeding with surgery as planned   Carlyle Dolly MD

## 2017-11-15 DIAGNOSIS — I4891 Unspecified atrial fibrillation: Secondary | ICD-10-CM

## 2017-11-17 ENCOUNTER — Telehealth: Payer: Self-pay | Admitting: *Deleted

## 2017-11-17 DIAGNOSIS — I4891 Unspecified atrial fibrillation: Secondary | ICD-10-CM | POA: Diagnosis not present

## 2017-11-17 NOTE — Telephone Encounter (Signed)
Would await echo results   J BrancH MD

## 2017-11-17 NOTE — Telephone Encounter (Signed)
Darius Smith with [facility] made aware that echo is scheduled for 3/21

## 2017-11-17 NOTE — Telephone Encounter (Signed)
Connie with [facility] wanted to confirm that pt surgical clearance was delayed until after pt completes echo and event monitor, is this correct?

## 2017-11-19 ENCOUNTER — Encounter: Payer: Self-pay | Admitting: Cardiology

## 2017-11-21 NOTE — Telephone Encounter (Signed)
Scheduled now for November 23, 2017

## 2017-11-23 ENCOUNTER — Ambulatory Visit (INDEPENDENT_AMBULATORY_CARE_PROVIDER_SITE_OTHER): Payer: Medicare Other

## 2017-11-23 ENCOUNTER — Other Ambulatory Visit: Payer: Self-pay

## 2017-11-23 DIAGNOSIS — I4891 Unspecified atrial fibrillation: Secondary | ICD-10-CM | POA: Diagnosis not present

## 2017-11-24 ENCOUNTER — Telehealth: Payer: Self-pay | Admitting: *Deleted

## 2017-11-24 NOTE — Telephone Encounter (Signed)
-----   Message from Arnoldo Lenis, MD sent at 11/24/2017 12:24 PM EST ----- Echo looks good, normal heart function. Ok to proceed with his planned urology procedure from cardiac standpoint, please forward my addended clinic note to his surgeon.   Zandra Abts MD

## 2017-11-24 NOTE — Telephone Encounter (Signed)
Pt aware and voiced understanding - routed clinic notes and results to Dr Junious Silk

## 2017-11-28 ENCOUNTER — Other Ambulatory Visit: Payer: Self-pay | Admitting: Urology

## 2017-11-29 ENCOUNTER — Other Ambulatory Visit: Payer: Medicare Other

## 2017-12-01 ENCOUNTER — Encounter (HOSPITAL_COMMUNITY): Payer: Self-pay

## 2017-12-01 NOTE — Pre-Procedure Instructions (Signed)
The following are in epic: Cardiac Clearance mentioned on ECHO report 11/23/2017 Last office visit note from Dr. Harl Bowie  11/14/17 EKG 11/14/2017 Cardioversion 10/28/2017 CXR 10/28/2017

## 2017-12-01 NOTE — Patient Instructions (Signed)
Your procedure is scheduled on: Tuesday, December 06, 2017   Surgery Time:  7:30AM-8:30AM   Report to [facility] by 5:30 AM pick up the phone at the front desk dial [phone], have a seat in the lobby and a staff member will come and escort you to Short Stay Department   Call this number if you have problems the morning of surgery [phone]   Do not eat food or drink liquids :After Midnight.   Do NOT smoke after Midnight   Take these medicines the morning of surgery with A SIP OF WATER: Amlodipine, Omeprazole   Bring Asthma Inhaler day of surgery                               You may not have any metal on your body including  jewelry, and body piercings             Do not wear lotions, powders, perfumes/cologne, or deodorant                         Men may shave face and neck.   Do not bring valuables to the hospital. [place].   Contacts, dentures or bridgework may not be worn into surgery.    Patients discharged the day of surgery will not be allowed to drive home.   Special Instructions: Bring a copy of your healthcare power of attorney and living will documents         the day of surgery if you haven't scanned them in before.              Please read over the following fact sheets you were given:  [facility] - Preparing for Surgery Before surgery, you can play an important role.  Because skin is not sterile, your skin needs to be as free of germs as possible.  You can reduce the number of germs on your skin by washing with CHG (chlorahexidine gluconate) soap before surgery.  CHG is an antiseptic cleaner which kills germs and bonds with the skin to continue killing germs even after washing. Please DO NOT use if you have an allergy to CHG or antibacterial soaps.  If your skin becomes reddened/irritated stop using the CHG and inform your nurse when you arrive at Short Stay. Do not shave  (including legs and underarms) for at least 48 hours prior to the first CHG shower.  You may shave your face/neck.  Please follow these instructions carefully:  1.  Shower with CHG Soap the night before surgery and the  morning of surgery.  2.  If you choose to wash your hair, wash your hair first as usual with your normal  shampoo.  3.  After you shampoo, rinse your hair and body thoroughly to remove the shampoo.                             4.  Use CHG as you would any other liquid soap.  You can apply chg directly to the skin and wash.  Gently with a scrungie or clean washcloth.  5.  Apply the CHG Soap to your body ONLY FROM THE NECK DOWN.   Do  not use on face/ open                           Wound or open sores. Avoid contact with eyes, ears mouth and   genitals (private parts).                       Wash face,  Genitals (private parts) with your normal soap.             6.  Wash thoroughly, paying special attention to the area where your    surgery  will be performed.  7.  Thoroughly rinse your body with warm water from the neck down.  8.  DO NOT shower/wash with your normal soap after using and rinsing off the CHG Soap.                9.  Pat yourself dry with a clean towel.            10.  Wear clean pajamas.            11.  Place clean sheets on your bed the night of your first shower and do not  sleep with pets. Day of Surgery : Do not apply any lotions/deodorants the morning of surgery.  Please wear clean clothes to the hospital/surgery center.  FAILURE TO FOLLOW THESE INSTRUCTIONS MAY RESULT IN THE CANCELLATION OF YOUR SURGERY  PATIENT SIGNATURE_________________________________  NURSE SIGNATURE__________________________________  ________________________________________________________________________

## 2017-12-02 ENCOUNTER — Other Ambulatory Visit: Payer: Self-pay

## 2017-12-02 ENCOUNTER — Encounter (HOSPITAL_COMMUNITY)
Admission: RE | Admit: 2017-12-02 | Discharge: 2017-12-02 | Disposition: A | Discharge status: Home / Self Care | Payer: Medicare Other | Source: Ambulatory Visit | Attending: Urology | Admitting: Urology

## 2017-12-02 ENCOUNTER — Encounter (HOSPITAL_COMMUNITY): Payer: Self-pay

## 2017-12-02 DIAGNOSIS — Z01818 Encounter for other preprocedural examination: Secondary | ICD-10-CM | POA: Diagnosis not present

## 2017-12-02 DIAGNOSIS — N201 Calculus of ureter: Secondary | ICD-10-CM | POA: Diagnosis not present

## 2017-12-02 HISTORY — DX: Diverticulosis of large intestine without perforation or abscess without bleeding: K57.30

## 2017-12-02 HISTORY — DX: Cardiomegaly: I51.7

## 2017-12-02 HISTORY — DX: Adverse effect of unspecified anesthetic, initial encounter: T41.45XA

## 2017-12-02 HISTORY — DX: Hematuria, unspecified: R31.9

## 2017-12-02 HISTORY — DX: Residual hemorrhoidal skin tags: K64.4

## 2017-12-02 HISTORY — DX: Bronchitis, not specified as acute or chronic: J40

## 2017-12-02 HISTORY — DX: Malignant (primary) neoplasm, unspecified: C80.1

## 2017-12-02 HISTORY — DX: Personal history of colonic polyps: Z86.010

## 2017-12-02 HISTORY — DX: Other complications of anesthesia, initial encounter: T88.59XA

## 2017-12-02 HISTORY — DX: Cardiac arrhythmia, unspecified: I49.9

## 2017-12-02 HISTORY — DX: Personal history of other diseases of the digestive system: Z87.19

## 2017-12-02 HISTORY — DX: Unspecified atrial fibrillation: I48.91

## 2017-12-02 LAB — BASIC METABOLIC PANEL
Anion gap: 8 (ref 5–15)
BUN: 23 mg/dL — ABNORMAL HIGH (ref 6–20)
CO2: 23 mmol/L (ref 22–32)
Calcium: 8.9 mg/dL (ref 8.9–10.3)
Chloride: 109 mmol/L (ref 101–111)
Creatinine, Ser: 1.29 mg/dL — ABNORMAL HIGH (ref 0.61–1.24)
GFR calc Af Amer: >60 mL/min (ref >60)
GFR calc non Af Amer: 54 mL/min — ABNORMAL LOW (ref >60)
Glucose, Bld: 96 mg/dL (ref 65–99)
Potassium: 4.1 mmol/L (ref 3.5–5.1)
Sodium: 140 mmol/L (ref 135–145)

## 2017-12-02 LAB — CBC
HCT: 43.9 % (ref 39.0–52.0)
Hemoglobin: 14.6 g/dL (ref 13.0–17.0)
MCH: 29.7 pg (ref 26.0–34.0)
MCHC: 33.3 g/dL (ref 30.0–36.0)
MCV: 89.4 fL (ref 78.0–100.0)
Platelets: 275 10*3/uL (ref 150–400)
RBC: 4.91 MIL/uL (ref 4.22–5.81)
RDW: 12.9 % (ref 11.5–15.5)
WBC: 8.4 10*3/uL (ref 4.0–10.5)

## 2017-12-02 NOTE — Pre-Procedure Instructions (Signed)
BMP results 12/02/2017 faxed to Dr. Junious Silk via epic.

## 2017-12-06 ENCOUNTER — Encounter (HOSPITAL_COMMUNITY)
Admission: RE | Disposition: A | Discharge status: Home / Self Care | Payer: Self-pay | Source: Ambulatory Visit | Attending: Urology

## 2017-12-06 ENCOUNTER — Ambulatory Visit (HOSPITAL_COMMUNITY): Payer: Medicare Other

## 2017-12-06 ENCOUNTER — Ambulatory Visit (HOSPITAL_COMMUNITY): Payer: Medicare Other | Admitting: Anesthesiology

## 2017-12-06 ENCOUNTER — Encounter (HOSPITAL_COMMUNITY): Payer: Self-pay | Admitting: *Deleted

## 2017-12-06 ENCOUNTER — Ambulatory Visit (HOSPITAL_COMMUNITY)
Admission: RE | Admit: 2017-12-06 | Discharge: 2017-12-06 | Disposition: A | Discharge status: Home / Self Care | Payer: Medicare Other | Source: Ambulatory Visit | Attending: Urology | Admitting: Urology

## 2017-12-06 DIAGNOSIS — N132 Hydronephrosis with renal and ureteral calculous obstruction: Secondary | ICD-10-CM | POA: Insufficient documentation

## 2017-12-06 DIAGNOSIS — I10 Essential (primary) hypertension: Secondary | ICD-10-CM | POA: Diagnosis not present

## 2017-12-06 DIAGNOSIS — K219 Gastro-esophageal reflux disease without esophagitis: Secondary | ICD-10-CM | POA: Diagnosis not present

## 2017-12-06 DIAGNOSIS — Z79899 Other long term (current) drug therapy: Secondary | ICD-10-CM | POA: Insufficient documentation

## 2017-12-06 DIAGNOSIS — K625 Hemorrhage of anus and rectum: Secondary | ICD-10-CM | POA: Diagnosis not present

## 2017-12-06 DIAGNOSIS — Z7982 Long term (current) use of aspirin: Secondary | ICD-10-CM | POA: Diagnosis not present

## 2017-12-06 DIAGNOSIS — Z87442 Personal history of urinary calculi: Secondary | ICD-10-CM | POA: Insufficient documentation

## 2017-12-06 DIAGNOSIS — Z880 Allergy status to penicillin: Secondary | ICD-10-CM | POA: Insufficient documentation

## 2017-12-06 DIAGNOSIS — N201 Calculus of ureter: Secondary | ICD-10-CM | POA: Diagnosis not present

## 2017-12-06 DIAGNOSIS — I4891 Unspecified atrial fibrillation: Secondary | ICD-10-CM | POA: Diagnosis not present

## 2017-12-06 HISTORY — PX: CYSTOSCOPY/URETEROSCOPY/HOLMIUM LASER/STENT PLACEMENT: SHX6546

## 2017-12-06 SURGERY — CYSTOSCOPY/URETEROSCOPY/HOLMIUM LASER/STENT PLACEMENT
Anesthesia: General | Site: Ureter | Laterality: Bilateral

## 2017-12-06 MED ORDER — LACTATED RINGERS IV SOLN: INTRAVENOUS | Status: DC

## 2017-12-06 MED ORDER — FENTANYL CITRATE (PF) 100 MCG/2ML IJ SOLN
INTRAMUSCULAR | Status: DC | PRN
  Administered 2017-12-06 (×2): 25 ug via INTRAVENOUS
  Administered 2017-12-06: 50 ug via INTRAVENOUS

## 2017-12-06 MED ORDER — LIDOCAINE HCL 2 % EX GEL
CUTANEOUS | Status: AC
  Filled 2017-12-06: qty 5

## 2017-12-06 MED ORDER — ONDANSETRON HCL 4 MG/2ML IJ SOLN
INTRAMUSCULAR | Status: AC
  Filled 2017-12-06: qty 2

## 2017-12-06 MED ORDER — LIDOCAINE 2% (20 MG/ML) 5 ML SYRINGE
INTRAMUSCULAR | Status: AC
  Filled 2017-12-06: qty 5

## 2017-12-06 MED ORDER — BELLADONNA-OPIUM 16.2-30 MG RE SUPP
RECTAL | Status: AC
  Filled 2017-12-06: qty 1

## 2017-12-06 MED ORDER — CEFAZOLIN SODIUM-DEXTROSE 2-4 GM/100ML-% IV SOLN
2.0000 g | Freq: Once | INTRAVENOUS | Status: AC
  Administered 2017-12-06: 2 g via INTRAVENOUS
  Filled 2017-12-06: qty 100

## 2017-12-06 MED ORDER — HYDROMORPHONE HCL 1 MG/ML IJ SOLN: 0.2500 mg | INTRAMUSCULAR | Status: DC | PRN

## 2017-12-06 MED ORDER — MIDAZOLAM HCL 2 MG/2ML IJ SOLN
INTRAMUSCULAR | Status: AC
  Filled 2017-12-06: qty 2

## 2017-12-06 MED ORDER — ONDANSETRON HCL 4 MG/2ML IJ SOLN
INTRAMUSCULAR | Status: DC | PRN
  Administered 2017-12-06: 4 mg via INTRAVENOUS

## 2017-12-06 MED ORDER — LACTATED RINGERS IV SOLN
INTRAVENOUS | Status: DC
  Administered 2017-12-06: 06:00:00 via INTRAVENOUS

## 2017-12-06 MED ORDER — SODIUM CHLORIDE 0.9 % IV SOLN
INTRAVENOUS | Status: DC | PRN
  Administered 2017-12-06: 10 mL

## 2017-12-06 MED ORDER — LIDOCAINE 2% (20 MG/ML) 5 ML SYRINGE
INTRAMUSCULAR | Status: DC | PRN
  Administered 2017-12-06: 100 mg via INTRAVENOUS

## 2017-12-06 MED ORDER — EPHEDRINE 5 MG/ML INJ
INTRAVENOUS | Status: AC
  Filled 2017-12-06: qty 10

## 2017-12-06 MED ORDER — PROPOFOL 10 MG/ML IV BOLUS
INTRAVENOUS | Status: AC
  Filled 2017-12-06: qty 20

## 2017-12-06 MED ORDER — MIDAZOLAM HCL 5 MG/5ML IJ SOLN
INTRAMUSCULAR | Status: DC | PRN
  Administered 2017-12-06: 2 mg via INTRAVENOUS

## 2017-12-06 MED ORDER — MEPERIDINE HCL 50 MG/ML IJ SOLN: 6.2500 mg | INTRAMUSCULAR | Status: DC | PRN

## 2017-12-06 MED ORDER — PROPOFOL 10 MG/ML IV BOLUS
INTRAVENOUS | Status: DC | PRN
  Administered 2017-12-06: 150 mg via INTRAVENOUS

## 2017-12-06 MED ORDER — DEXAMETHASONE SODIUM PHOSPHATE 10 MG/ML IJ SOLN
INTRAMUSCULAR | Status: AC
  Filled 2017-12-06: qty 1

## 2017-12-06 MED ORDER — PROMETHAZINE HCL 25 MG/ML IJ SOLN: 6.2500 mg | INTRAMUSCULAR | Status: DC | PRN

## 2017-12-06 MED ORDER — FENTANYL CITRATE (PF) 100 MCG/2ML IJ SOLN
INTRAMUSCULAR | Status: AC
  Filled 2017-12-06: qty 2

## 2017-12-06 MED ORDER — EPHEDRINE SULFATE-NACL 50-0.9 MG/10ML-% IV SOSY
PREFILLED_SYRINGE | INTRAVENOUS | Status: DC | PRN
  Administered 2017-12-06: 15 mg via INTRAVENOUS
  Administered 2017-12-06: 10 mg via INTRAVENOUS

## 2017-12-06 MED ORDER — DEXAMETHASONE SODIUM PHOSPHATE 10 MG/ML IJ SOLN
INTRAMUSCULAR | Status: DC | PRN
  Administered 2017-12-06: 10 mg via INTRAVENOUS

## 2017-12-06 SURGICAL SUPPLY — 17 items
BAG URO CATCHER STRL LF (MISCELLANEOUS) ×2 IMPLANT
BASKET ZERO TIP NITINOL 2.4FR (BASKET) ×1 IMPLANT
BSKT STON RTRVL ZERO TP 2.4FR (BASKET) ×1
CATH INTERMIT  6FR 70CM (CATHETERS) IMPLANT
CLOTH BEACON ORANGE TIMEOUT ST (SAFETY) ×2 IMPLANT
COVER FOOTSWITCH UNIV (MISCELLANEOUS) ×1 IMPLANT
COVER SURGICAL LIGHT HANDLE (MISCELLANEOUS) ×2 IMPLANT
GLOVE BIOGEL M STRL SZ7.5 (GLOVE) ×2 IMPLANT
GOWN STRL REUS W/TWL LRG LVL3 (GOWN DISPOSABLE) ×2 IMPLANT
GOWN STRL REUS W/TWL XL LVL3 (GOWN DISPOSABLE) ×2 IMPLANT
GUIDEWIRE ANG ZIPWIRE 038X150 (WIRE) ×1 IMPLANT
GUIDEWIRE STR DUAL SENSOR (WIRE) ×2 IMPLANT
MANIFOLD NEPTUNE II (INSTRUMENTS) ×2 IMPLANT
PACK CYSTO (CUSTOM PROCEDURE TRAY) ×2 IMPLANT
STENT URET 6FRX26 CONTOUR (STENTS) ×1 IMPLANT
TUBING CONNECTING 10 (TUBING) ×2 IMPLANT
TUBING UROLOGY SET (TUBING) ×1 IMPLANT

## 2017-12-06 NOTE — Anesthesia Procedure Notes (Signed)
Procedure Name: LMA Insertion Date/Time: 12/06/2017 7:40 AM Performed by: Lind Covert, CRNA Pre-anesthesia Checklist: Patient identified, Emergency Drugs available, Suction available, Timeout performed and Patient being monitored Patient Re-evaluated:Patient Re-evaluated prior to induction Oxygen Delivery Method: Circle system utilized Preoxygenation: Pre-oxygenation with 100% oxygen Induction Type: IV induction LMA: LMA inserted LMA Size: 4.0 Number of attempts: 1 Placement Confirmation: positive ETCO2 and breath sounds checked- equal and bilateral Tube secured with: Tape Dental Injury: Teeth and Oropharynx as per pre-operative assessment

## 2017-12-06 NOTE — Discharge Instructions (Signed)

## 2017-12-06 NOTE — H&P (Addendum)
H&P  Chief Complaint: Right ureteral stone  History of Present Illness: 72 year old white male who was admitted for a rectal bleed December 2018 and CT scan noted a 6 mm right proximal stone with hydronephrosis.  This appeared chronic as the right kidney was showing signs of atrophy.  He followed up in the office and continued to have hydronephrosis and the stone not readily visible on KUB.  Follow-up CT showed the stone in the mid ureter at the iliacs.  He was taken for ureteroscopy but the ureter was too tight and he was pre-stented February 2019.  He developed A. fib and RVR postop and was transferred to the emergency department where he underwent DCCV.  He followed up with cardiology and is stable in normal sinus rhythm.  He has had no fever or dysuria.  He has pain in the right lower quadrant especially when he voids.  Urine has been "muddy" with typical hematuria with cola colored urine.  Past Medical History:  Diagnosis Date  . Arthritis    both hands  . Atrial fibrillation ([place]) 10/28/2017   Dr. Harl Bowie  . Blood in urine    occ  . Bronchitis   . Cancer ([facility])    Basal cell  . Cardiomegaly 10/28/2017  . Complication of anesthesia    Mr. Stief developed a. fib in recovery after cysto procedure 10/2017 was transferred to [facility] and underwent successful cardioversion  . Diverticulosis of sigmoid colon 08/2017   Noted on colonoscopy  . Dysrhythmia   . External hemorrhoids 08/2017  . GERD (gastroesophageal reflux disease)   . History of colon polyps 08/2017  . History of kidney stones   . History of rectal bleeding   . History of right inguinal hernia   . Hypertension   . Shortness of breath    Past Surgical History:  Procedure Laterality Date  . CARDIOVERSION     10/2017  . CHOLECYSTECTOMY    . 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 CLEFT PALATE REPAIR     several from childhood til 72 years old  . COLONOSCOPY  04/28/2011   Procedure: COLONOSCOPY;   Surgeon: Rogene Houston, MD;  [facility]: [facility];  Service: Endoscopy;  Laterality: N/A;  . COLONOSCOPY N/A 07/18/2014   Procedure: COLONOSCOPY;  Surgeon: Rogene Houston, MD;  Location: [facility];  Service: Endoscopy;  Laterality: N/A;  830  . COLONOSCOPY N/A 09/09/2017   Procedure: COLONOSCOPY;  Surgeon: Rogene Houston, MD;  Location: [facility];  Service: Endoscopy;  Laterality: N/A;  955  . HERNIA REPAIR    . POLYPECTOMY  09/09/2017   Procedure: POLYPECTOMY;  Surgeon: Rogene Houston, MD;  Location: [facility];  Service: Endoscopy;;  colon  . vocal cord surgery      Home Medications:  Medications Prior to Admission  Medication Sig Dispense Refill Last Dose  . acetaminophen (TYLENOL) 500 MG tablet Take 1,000 mg by mouth every 6 (six) hours as needed for moderate pain or headache.   12/03/2017  . amLODipine (NORVASC) 10 MG tablet Take 10 mg by mouth daily.  0 12/06/2017 at 0400  . aspirin 81 MG chewable tablet Chew 1 tablet (81 mg total) by mouth daily. 30 tablet 0 12/05/2017  . benazepril (LOTENSIN) 40 MG tablet Take 40 mg by mouth daily.  11 12/05/2017 at 0800  . omeprazole (PRILOSEC) 20 MG capsule Take 20 mg by mouth daily as needed (for  heartburn).    Past Month at Unknown time  . VENTOLIN HFA 108 (90 Base) MCG/ACT inhaler Inhale 2 puffs into the lungs every 6 (six) hours as needed for wheezing or shortness of breath.   11 12/04/2017  . cetirizine (ZYRTEC) 10 MG tablet Take 10 mg by mouth daily as needed for allergies.   More than a month at Unknown time   Allergies:  Allergies  Allergen Reactions  . Augmentin [Amoxicillin-Pot Clavulanate] Other (See Comments)    Increased liver enzymes Has patient had a PCN reaction causing immediate rash, facial/tongue/throat swelling, SOB or lightheadedness with hypotension: No Has patient had a PCN reaction causing severe rash involving mucus membranes or skin necrosis: No Has patient had a PCN reaction that required  hospitalization: No Has patient had a PCN reaction occurring within the last 10 years: No  If all of the above answers are "NO", then may proceed with Cephalosporin use.   Marland Kitchen Flomax [Tamsulosin Hcl] Nausea Only    Dizziness     Family History  Problem Relation Age of Onset  . Breast cancer Mother   . Prostate cancer Father   . COPD Sister    Social History:  reports that  has never smoked. he has never used smokeless tobacco. He reports that he drinks about 4.2 oz of alcohol per week. He reports that he does not use drugs.  ROS: A complete review of systems was performed.  All systems are negative except for pertinent findings as noted. Review of Systems  All other systems reviewed and are negative.    Physical Exam:  Vital signs in last 24 hours: Temp:  [98.3 F (36.8 C)] 98.3 F (36.8 C) (03/19 0546) Pulse Rate:  [66] 66 (03/19 0546) Resp:  [18] 18 (03/19 0546) BP: (170)/(93) 170/93 (03/19 0546) SpO2:  [99 %] 99 % (03/19 0546) Weight:  [89.8 kg (198 lb)] 89.8 kg (198 lb) (03/19 0553) General:  Alert and oriented, No acute distress HEENT: Normocephalic, atraumatic Cardiovascular: Regular rate and rhythm Lungs: Regular rate and effort Abdomen: Soft, nontender, nondistended, no abdominal masses Back: No CVA tenderness Extremities: No edema Neurologic: Grossly intact  Laboratory Data:  No results found for this or any previous visit (from the past 24 hour(s)). No results found for this or any previous visit (from the past 240 hour(s)). Creatinine: Recent Labs    12/02/17 1001  CREATININE 1.29*    Impression/Assessment/plan: I discussed with the patient the nature, potential benefits, risks and alternatives to cystoscopy, right stent exchange, right ureteroscopy, laser lithotripsy, including side effects of the proposed treatment, the likelihood of the patient achieving the goals of the procedure, and any potential problems that might occur during the procedure or  recuperation. All questions answered. Patient elects to proceed.    Festus Aloe 12/06/2017, 7:30 AM

## 2017-12-06 NOTE — Anesthesia Preprocedure Evaluation (Addendum)
Anesthesia Evaluation  Patient identified by MRN, date of birth, ID band Patient awake    Reviewed: Allergy & Precautions, NPO status , Patient's Chart, lab work & pertinent test results  Airway Mallampati: II  TM Distance: >3 FB Neck ROM: Full    Dental  (+) Missing, Dental Advisory Given, Poor Dentition,    Pulmonary shortness of breath,    breath sounds clear to auscultation       Cardiovascular hypertension, Pt. on medications + dysrhythmias Atrial Fibrillation  Rhythm:Regular Rate:Normal     Neuro/Psych negative neurological ROS  negative psych ROS   GI/Hepatic Neg liver ROS, GERD  Medicated,  Endo/Other  negative endocrine ROS  Renal/GU negative Renal ROS  negative genitourinary   Musculoskeletal  (+) Arthritis ,   Abdominal Normal abdominal exam  (+)   Peds  Hematology negative hematology ROS (+)   Anesthesia Other Findings   Reproductive/Obstetrics                           Lab Results  Component Value Date   WBC 8.4 12/02/2017   HGB 14.6 12/02/2017   HCT 43.9 12/02/2017   MCV 89.4 12/02/2017   PLT 275 12/02/2017   Lab Results  Component Value Date   CREATININE 1.29 (H) 12/02/2017   BUN 23 (H) 12/02/2017   NA 140 12/02/2017   K 4.1 12/02/2017   CL 109 12/02/2017   CO2 23 12/02/2017   Lab Results  Component Value Date   INR 1.01 10/28/2017   Echo: - Left ventricle: The cavity size was normal. Wall thickness was   increased in a pattern of mild LVH. Systolic function was normal.The estimated ejection fraction was in the range of 55% to 60%. Wall motion was normal; there were no regional wall motion abnormalities. Doppler parameters are consistent with abnormal left ventricular relaxation (grade 1 diastolic dysfunction). - Aortic valve: Mildly calcified annulus. Mildly thickened   leaflets. Mean gradient (S): 7 mm Hg. Valve area (VTI): 1.73   cm^2. - Mitral valve: Mildly  calcified annulus. Mildly thickened leaflets. - Left atrium: The atrium was mildly to moderately dilated. - Technically adequate study.  Anesthesia Physical Anesthesia Plan  ASA: III  Anesthesia Plan: General   Post-op Pain Management:    Induction: Intravenous  PONV Risk Score and Plan: 3 and Ondansetron, Dexamethasone and Midazolam  Airway Management Planned: Oral ETT  Additional Equipment: None  Intra-op Plan:   Post-operative Plan: Extubation in OR  Informed Consent: I have reviewed the patients History and Physical, chart, labs and discussed the procedure including the risks, benefits and alternatives for the proposed anesthesia with the patient or authorized representative who has indicated his/her understanding and acceptance.   Dental advisory given  Plan Discussed with: CRNA  Anesthesia Plan Comments:        Anesthesia Quick Evaluation

## 2017-12-06 NOTE — Anesthesia Postprocedure Evaluation (Signed)
Anesthesia Post Note  Patient: Darius Smith  Procedure(s) Performed: CYSTOSCOPY/RETROGRADE/URETEROSCOPY/HOLMIUM LASER/STENT EXCHANGE (Bilateral Ureter)     Patient location during evaluation: PACU Anesthesia Type: General Level of consciousness: awake and alert Pain management: pain level controlled Vital Signs Assessment: post-procedure vital signs reviewed and stable Respiratory status: spontaneous breathing, nonlabored ventilation, respiratory function stable and patient connected to nasal cannula oxygen Cardiovascular status: blood pressure returned to baseline and stable Postop Assessment: no apparent nausea or vomiting Anesthetic complications: no    Last Vitals:  Vitals:   12/06/17 0930 12/06/17 0941  BP: 124/75 121/82  Pulse: 72 62  Resp: 18 18  Temp: 36.5 C   SpO2: 97% 96%    Last Pain:  Vitals:   12/06/17 0930  TempSrc:   PainSc: 0-No pain                 Effie Berkshire

## 2017-12-06 NOTE — OR Nursing (Signed)
Ureteral stent removed from right ureter by dr Junious Silk

## 2017-12-06 NOTE — Op Note (Signed)
Preoperative diagnosis: Right ureteral stone, right hydronephrosis, right renal atrophy Postoperative diagnosis: Same  Procedure: Cystoscopy with right ureteroscopy, holmium laser lithotripsy, stone basket extraction, right retrograde pyelogram, right ureteral stent exchange  Surgeon: Junious Silk  Anesthesia: General  Indication for procedure: 72 year old white male with what appeared to be long-standing obstruction of the right ureteral stone who underwent prestenting.  He was brought for definitive treatment of the stone today.  Findings: Cystoscopy was unremarkable.  On right ureteroscopy the distal ureter remained quite narrow and required the single-channel needlepoint semirigid ureteroscope.  The stone was quite impacted in the wall of the ureter and the wire was submucosal for about 5 mm.  The stone was just over the iliacs in the proximal ureter.  Right retrograde pyelogram-this outlined the ureter without extravasation.  There was narrowing at the stone impaction site, but no extravasation.  Otherwise the ureter appeared normal.  No other filling defects or hydronephrosis.  Only a small amount of contrast reached the collecting system, but the ureter was adequately opacified.  Description of procedure: After consent was obtained patient brought to the operating room.  After adequate anesthesia he was placed in lithotomy position and prepped and draped in the usual sterile fashion.  A timeout was performed to confirm the patient and procedure.  The cystoscope was passed per urethra and the right ureteral stent grasped and removed through the urethral meatus.  A sensor wire was advanced through the stent and coiled in the collecting system.  The stent and the urine was quite clear, but I still irrigated the bladder several times.  I then took the dual channel semirigid ureteroscope and was able to get through the distal ureter but not through the mid.  The ureter was quite tight and as resistance  developed I thought it was wise to switch to the single-channel scope.  The single-channel needlepoint semirigid was advanced and this went much easier.  The stone was approached just above the iliacs adjacent to L4/L5.  I then passed a 200 m laser fiber and the stone was basically dusted at 0.3 and 50.  As the pieces broke fortunately they drop down the ureter and were dusted.  One piece remained and it was dusted and then the larger pieces were sequentially grasped and dropped in the bladder.  I periodically drain the bladder with the cystoscope.  Inspection was done back up to the stone impaction site this was noted to be clear but a fairly large cavity.  The wire was noted to be  submucosal for a few millimeters.  I lasered the mucosa which opened it up and allowed the wire to be completely luminal.  There were no other visible stone fragments proximal but I did not want to push the scope up over this impaction site.  I could see up the ureter.  The scope was backed down into the mid and distal ureter and a few other small fragments were grasped and dropped into the bladder with a 0 tip basket.  I drained the bladder with the cystoscope and did one final inspection of the ureter up to the impaction site noted no other stone fragments and no injury.  I backed back into the distal ureter and retrograde injection of contrast was pushed through the ureteroscope.  There was no ureteral injury or extravasation.  The scope was backed out and the wire backloaded on the cystoscope and a 6 x 26 cm stent advanced.  The wire was removed with a good  coil seen in the collecting system and a good coil in the bladder.  The bladder was drained and the scope removed.  He was awakened and taken to the recovery room in stable condition.  Complications: None Blood loss: Minimal  Specimens to office lab: 1 very small stone fragment  Drains: 6 x 26 cm right ureteral stent  Disposition: Patient stable to PACU.  I will have  him return to the office in 10-14 days for stent removal.

## 2017-12-06 NOTE — Transfer of Care (Signed)
Immediate Anesthesia Transfer of Care Note  Patient: Darius Smith  Procedure(s) Performed: CYSTOSCOPY/RETROGRADE/URETEROSCOPY/HOLMIUM LASER/STENT EXCHANGE (Bilateral Ureter)  Patient Location: PACU  Anesthesia Type:General  Level of Consciousness: sedated  Airway & Oxygen Therapy: Patient Spontanous Breathing and Patient connected to face mask oxygen  Post-op Assessment: Report given to RN and Post -op Vital signs reviewed and stable  Post vital signs: Reviewed and stable  Last Vitals:  Vitals:   12/06/17 0546  BP: (!) 170/93  Pulse: 66  Resp: 18  Temp: 36.8 C  SpO2: 99%    Last Pain:  Vitals:   12/06/17 0553  TempSrc:   PainSc: 0-No pain         Complications: No apparent anesthesia complications

## 2017-12-07 ENCOUNTER — Encounter (HOSPITAL_COMMUNITY): Payer: Self-pay | Admitting: Urology

## 2017-12-08 ENCOUNTER — Other Ambulatory Visit: Payer: Medicare Other

## 2017-12-16 DIAGNOSIS — N200 Calculus of kidney: Secondary | ICD-10-CM | POA: Diagnosis not present

## 2017-12-16 DIAGNOSIS — N201 Calculus of ureter: Secondary | ICD-10-CM | POA: Diagnosis not present

## 2017-12-27 ENCOUNTER — Encounter: Payer: Self-pay | Admitting: Cardiology

## 2017-12-27 ENCOUNTER — Ambulatory Visit: Payer: Medicare Other | Admitting: Cardiology

## 2017-12-27 ENCOUNTER — Other Ambulatory Visit: Payer: Self-pay

## 2017-12-27 VITALS — BP 165/90 | HR 69 | Ht 72.0 in | Wt 201.0 lb

## 2017-12-27 DIAGNOSIS — I4891 Unspecified atrial fibrillation: Secondary | ICD-10-CM | POA: Diagnosis not present

## 2017-12-27 MED ORDER — APIXABAN 5 MG PO TABS: 5.0000 mg | ORAL_TABLET | Freq: Two times a day (BID) | ORAL | 3 refills | Status: DC

## 2017-12-27 MED ORDER — APIXABAN 5 MG PO TABS: 5.0000 mg | ORAL_TABLET | Freq: Two times a day (BID) | ORAL | 0 refills | Status: DC

## 2017-12-27 MED ORDER — METOPROLOL TARTRATE 25 MG PO TABS: 12.5000 mg | ORAL_TABLET | Freq: Two times a day (BID) | ORAL | 1 refills | Status: DC

## 2017-12-27 NOTE — Progress Notes (Signed)
Clinical Summary Darius Smith is a 72 y.o.male seen today for a focused visit on his history of afib.   1. Afib - seen in ER 10/28/17. From ER notes new onset afib developed after recent urology procedure. - prior history of rectal bleeding last year that resolved - received DCCV in ER  - CHADS2Vasc score is 2, has not been committed to anticoagulation  11/2017 event monitor: awaiting final report, available data does show episode of afib, rate up to 130 - no recent symptoms   Past Medical History:  Diagnosis Date  . Arthritis    both hands  . Atrial fibrillation ([place]) 10/28/2017   Dr. Harl Bowie  . Blood in urine    occ  . Bronchitis   . Cancer (HCC)    Basal cell  . Cardiomegaly 10/28/2017  . Complication of anesthesia    Darius Smith developed a. fib in recovery after cysto procedure 10/2017 was transferred to [facility] and underwent successful cardioversion  . Diverticulosis of sigmoid colon 08/2017   Noted on colonoscopy  . Dysrhythmia   . External hemorrhoids 08/2017  . GERD (gastroesophageal reflux disease)   . History of colon polyps 08/2017  . History of kidney stones   . History of rectal bleeding   . History of right inguinal hernia   . Hypertension   . Shortness of breath      Allergies  Allergen Reactions  . Augmentin [Amoxicillin-Pot Clavulanate] Other (See Comments)    Increased liver enzymes Has patient had a PCN reaction causing immediate rash, facial/tongue/throat swelling, SOB or lightheadedness with hypotension: No Has patient had a PCN reaction causing severe rash involving mucus membranes or skin necrosis: No Has patient had a PCN reaction that required hospitalization: No Has patient had a PCN reaction occurring within the last 10 years: No  If all of the above answers are "NO", then may proceed with Cephalosporin use.   Marland Kitchen Flomax [Tamsulosin Hcl] Nausea Only    Dizziness      Current Outpatient Medications  Medication Sig Dispense Refill  .  acetaminophen (TYLENOL) 500 MG tablet Take 1,000 mg by mouth every 6 (six) hours as needed for moderate pain or headache.    Marland Kitchen amLODipine (NORVASC) 10 MG tablet Take 10 mg by mouth daily.  0  . aspirin 81 MG chewable tablet Chew 1 tablet (81 mg total) by mouth daily. 30 tablet 0  . benazepril (LOTENSIN) 40 MG tablet Take 40 mg by mouth daily.  11  . cetirizine (ZYRTEC) 10 MG tablet Take 10 mg by mouth daily as needed for allergies.    Marland Kitchen omeprazole (PRILOSEC) 20 MG capsule Take 20 mg by mouth daily as needed (for heartburn).     . VENTOLIN HFA 108 (90 Base) MCG/ACT inhaler Inhale 2 puffs into the lungs every 6 (six) hours as needed for wheezing or shortness of breath.   11   No current facility-administered medications for this visit.      Past Surgical History:  Procedure Laterality Date  . CARDIOVERSION     10/2017  . CHOLECYSTECTOMY    . 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 CLEFT PALATE REPAIR     several from childhood til 72 years old  . COLONOSCOPY  04/28/2011   Procedure: COLONOSCOPY;  Surgeon: Rogene Houston, MD;  [facility];  Service: Endoscopy;  Laterality: N/A;  . COLONOSCOPY N/A 07/18/2014   Procedure: COLONOSCOPY;  Surgeon: Rogene Houston, MD;  [facility];  Service: Endoscopy;  Laterality: N/A;  830  . COLONOSCOPY N/A 09/09/2017   Procedure: COLONOSCOPY;  Surgeon: Rogene Houston, MD;  [facility];  Service: Endoscopy;  Laterality: N/A;  955  . CYSTOSCOPY/URETEROSCOPY/HOLMIUM LASER/STENT PLACEMENT Bilateral 12/06/2017   Procedure: CYSTOSCOPY/RETROGRADE/URETEROSCOPY/HOLMIUM LASER/STENT EXCHANGE;  Surgeon: Festus Aloe, MD;  Location: [facility];  Service: Urology;  Laterality: Bilateral;  ONLY NEEDS 60 MIN  . HERNIA REPAIR    . POLYPECTOMY  09/09/2017   Procedure: POLYPECTOMY;  Surgeon: Rogene Houston, MD;  [facility];  Service: Endoscopy;;  colon  . vocal cord surgery       Allergies    Allergen Reactions  . Augmentin [Amoxicillin-Pot Clavulanate] Other (See Comments)    Increased liver enzymes Has patient had a PCN reaction causing immediate rash, facial/tongue/throat swelling, SOB or lightheadedness with hypotension: No Has patient had a PCN reaction causing severe rash involving mucus membranes or skin necrosis: No Has patient had a PCN reaction that required hospitalization: No Has patient had a PCN reaction occurring within the last 10 years: No  If all of the above answers are "NO", then may proceed with Cephalosporin use.   Marland Kitchen Flomax [Tamsulosin Hcl] Nausea Only    Dizziness       Family History  Problem Relation Age of Onset  . Breast cancer Mother   . Prostate cancer Father   . COPD Sister      Social History Darius Smith reports that he has never smoked. He has never used smokeless tobacco. Darius Smith reports that he drinks about 4.2 oz of alcohol per week.   Review of Systems CONSTITUTIONAL: No weight loss, fever, chills, weakness or fatigue.  HEENT: Eyes: No visual loss, blurred vision, double vision or yellow sclerae.No hearing loss, sneezing, congestion, runny nose or sore throat.  SKIN: No rash or itching.  CARDIOVASCULAR: per hpi RESPIRATORY: No shortness of breath, cough or sputum.  GASTROINTESTINAL: No anorexia, nausea, vomiting or diarrhea. No abdominal pain or blood.  GENITOURINARY: No burning on urination, no polyuria NEUROLOGICAL: No headache, dizziness, syncope, paralysis, ataxia, numbness or tingling in the extremities. No change in bowel or bladder control.  MUSCULOSKELETAL: No muscle, back pain, joint pain or stiffness.  LYMPHATICS: No enlarged nodes. No history of splenectomy.  PSYCHIATRIC: No history of depression or anxiety.  ENDOCRINOLOGIC: No reports of sweating, cold or heat intolerance. No polyuria or polydipsia.  Marland Kitchen   Physical Examination Vitals:   12/27/17 1508  BP: (!) 165/90  Pulse: 69  SpO2: 96%   Vitals:    12/27/17 1508  Weight: 201 lb (91.2 kg)  Height: 6' (1.829 m)    Gen: resting comfortably, no acute distress HEENT: no scleral icterus, pupils equal round and reactive, no palptable cervical adenopathy,  CV: RRR, no m/r/g, no jvd Resp: Clear to auscultation bilaterally GI: abdomen is soft, non-tender, non-distended, normal bowel sounds, no hepatosplenomegaly MSK: extremities are warm, no edema.  Skin: warm, no rash Neuro:  no focal deficits Psych: appropriate affect   Diagnostic Studies 11/2017 echo Study Conclusions  - Left ventricle: The cavity size was normal. Wall thickness was   increased in a pattern of mild LVH. Systolic function was normal.   The estimated ejection fraction was in the range of 55% to 60%.   Wall motion was normal; there were no regional wall motion   abnormalities. Doppler parameters are consistent with abnormal   left ventricular  relaxation (grade 1 diastolic dysfunction). - Aortic valve: Mildly calcified annulus. Mildly thickened   leaflets. Mean gradient (S): 7 mm Hg. Valve area (VTI): 1.73   cm^2. - Mitral valve: Mildly calcified annulus. Mildly thickened leaflets   . - Left atrium: The atrium was mildly to moderately dilated. - Technically adequate study.    Assessment and Plan  1. PAF - confirmed recurrence by recent cardiac monitor - he will start eliquis 5mg  bid, lopressor 12.5mg  bid. STop ASA   F/u 6 months       Arnoldo Lenis, M.D., F.A.C.C.

## 2017-12-27 NOTE — Patient Instructions (Signed)
Your physician wants you to follow-up in: [place] will receive a reminder letter in the mail two months in advance. If you don't receive a letter, please call our office to schedule the follow-up appointment.  Your physician has recommended you make the following change in your medication:   STOP ASPIRIN   START ELIQUIS 5 MG TWICE DAILY - WE HAVE GIVEN YOU SAMPLES  START LOPRESSOR 12.5 MG (1/2 TABLET) TWICE DAILY  Thank you for choosing [facility]!!

## 2017-12-31 ENCOUNTER — Encounter: Payer: Self-pay | Admitting: Cardiology

## 2018-01-02 DIAGNOSIS — Z87442 Personal history of urinary calculi: Secondary | ICD-10-CM | POA: Diagnosis not present

## 2018-01-02 DIAGNOSIS — I4891 Unspecified atrial fibrillation: Secondary | ICD-10-CM | POA: Diagnosis not present

## 2018-01-02 DIAGNOSIS — I10 Essential (primary) hypertension: Secondary | ICD-10-CM | POA: Diagnosis not present

## 2018-01-02 DIAGNOSIS — J301 Allergic rhinitis due to pollen: Secondary | ICD-10-CM | POA: Diagnosis not present

## 2018-01-09 DIAGNOSIS — N201 Calculus of ureter: Secondary | ICD-10-CM | POA: Diagnosis not present

## 2018-01-18 DIAGNOSIS — N201 Calculus of ureter: Secondary | ICD-10-CM | POA: Diagnosis not present

## 2018-01-18 DIAGNOSIS — R351 Nocturia: Secondary | ICD-10-CM | POA: Diagnosis not present

## 2018-01-18 DIAGNOSIS — N130 Hydronephrosis with ureteropelvic junction obstruction: Secondary | ICD-10-CM | POA: Diagnosis not present

## 2018-01-18 DIAGNOSIS — N281 Cyst of kidney, acquired: Secondary | ICD-10-CM | POA: Diagnosis not present

## 2018-03-18 ENCOUNTER — Encounter: Payer: Self-pay | Admitting: Cardiology

## 2018-03-27 ENCOUNTER — Other Ambulatory Visit: Payer: Self-pay | Admitting: Cardiology

## 2018-04-03 DIAGNOSIS — Z87442 Personal history of urinary calculi: Secondary | ICD-10-CM | POA: Diagnosis not present

## 2018-04-03 DIAGNOSIS — I4891 Unspecified atrial fibrillation: Secondary | ICD-10-CM | POA: Diagnosis not present

## 2018-04-03 DIAGNOSIS — J301 Allergic rhinitis due to pollen: Secondary | ICD-10-CM | POA: Diagnosis not present

## 2018-04-03 DIAGNOSIS — Z85828 Personal history of other malignant neoplasm of skin: Secondary | ICD-10-CM | POA: Diagnosis not present

## 2018-04-03 DIAGNOSIS — L570 Actinic keratosis: Secondary | ICD-10-CM | POA: Diagnosis not present

## 2018-04-03 DIAGNOSIS — L821 Other seborrheic keratosis: Secondary | ICD-10-CM | POA: Diagnosis not present

## 2018-04-03 DIAGNOSIS — I10 Essential (primary) hypertension: Secondary | ICD-10-CM | POA: Diagnosis not present

## 2018-05-15 ENCOUNTER — Other Ambulatory Visit: Payer: Self-pay | Admitting: Cardiology

## 2018-06-13 ENCOUNTER — Other Ambulatory Visit: Payer: Self-pay | Admitting: Cardiology

## 2018-06-26 ENCOUNTER — Encounter: Payer: Self-pay | Admitting: Cardiology

## 2018-06-26 ENCOUNTER — Ambulatory Visit: Payer: Medicare Other | Admitting: Cardiology

## 2018-06-26 ENCOUNTER — Encounter: Payer: Self-pay | Admitting: *Deleted

## 2018-06-26 VITALS — BP 136/79 | HR 70 | Ht 72.0 in | Wt 205.2 lb

## 2018-06-26 DIAGNOSIS — I10 Essential (primary) hypertension: Secondary | ICD-10-CM

## 2018-06-26 DIAGNOSIS — I480 Paroxysmal atrial fibrillation: Secondary | ICD-10-CM

## 2018-06-26 NOTE — Patient Instructions (Signed)

## 2018-06-26 NOTE — Progress Notes (Signed)
Clinical Summary Mr. Delcid is a 72 y.o.male seen today for follow up of the following medical problems.  1. PAF - on lopressor, eliquis for stroke prevention - no recent palpitations - compliant with meds. No bleeding on eliquis.    2. HTN - compliant with meds. - home bp's usually around 140s/70s with HRs in 60s.     Upcoming labs with pcp next week.    Past Medical History:  Diagnosis Date  . Arthritis    both hands  . Atrial fibrillation ([place]) 10/28/2017   Dr. Harl Bowie  . Blood in urine    occ  . Bronchitis   . Cancer (HCC)    Basal cell  . Cardiomegaly 10/28/2017  . Complication of anesthesia    Mr. Grafton developed a. fib in recovery after cysto procedure 10/2017 was transferred to [facility] and underwent successful cardioversion  . Diverticulosis of sigmoid colon 08/2017   Noted on colonoscopy  . Dysrhythmia   . External hemorrhoids 08/2017  . GERD (gastroesophageal reflux disease)   . History of colon polyps 08/2017  . History of kidney stones   . History of rectal bleeding   . History of right inguinal hernia   . Hypertension   . Shortness of breath      Allergies  Allergen Reactions  . Augmentin [Amoxicillin-Pot Clavulanate] Other (See Comments)    Increased liver enzymes Has patient had a PCN reaction causing immediate rash, facial/tongue/throat swelling, SOB or lightheadedness with hypotension: No Has patient had a PCN reaction causing severe rash involving mucus membranes or skin necrosis: No Has patient had a PCN reaction that required hospitalization: No Has patient had a PCN reaction occurring within the last 10 years: No  If all of the above answers are "NO", then may proceed with Cephalosporin use.   Marland Kitchen Flomax [Tamsulosin Hcl] Nausea Only    Dizziness      Current Outpatient Medications  Medication Sig Dispense Refill  . acetaminophen (TYLENOL) 500 MG tablet Take 1,000 mg by mouth every 6 (six) hours as needed for moderate pain or  headache.    Marland Kitchen amLODipine (NORVASC) 10 MG tablet Take 10 mg by mouth daily.  0  . benazepril (LOTENSIN) 40 MG tablet Take 40 mg by mouth daily.  11  . cetirizine (ZYRTEC) 10 MG tablet Take 10 mg by mouth daily as needed for allergies.    Marland Kitchen ELIQUIS 5 MG TABS tablet TAKE ONE TABLET BY MOUTH 2 TIMES A DAY. 60 tablet 6  . metoprolol tartrate (LOPRESSOR) 25 MG tablet TAKE 1/2 TABLET BY MOUTH 2 TIMES A DAY. 90 tablet 1  . omeprazole (PRILOSEC) 20 MG capsule Take 20 mg by mouth daily as needed (for heartburn).     . VENTOLIN HFA 108 (90 Base) MCG/ACT inhaler Inhale 2 puffs into the lungs every 6 (six) hours as needed for wheezing or shortness of breath.   11   No current facility-administered medications for this visit.      Past Surgical History:  Procedure Laterality Date  . CARDIOVERSION     10/2017  . CHOLECYSTECTOMY    . 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 CLEFT PALATE REPAIR     several from childhood til 72 years old  . COLONOSCOPY  04/28/2011   Procedure: COLONOSCOPY;  Surgeon: Rogene Houston, MD;  [facility];  Service: Endoscopy;  Laterality: N/A;  . COLONOSCOPY N/A 07/18/2014  Procedure: COLONOSCOPY;  Surgeon: Rogene Houston, MD;  [facility];  Service: Endoscopy;  Laterality: N/A;  830  . COLONOSCOPY N/A 09/09/2017   Procedure: COLONOSCOPY;  Surgeon: Rogene Houston, MD;  [facility];  Service: Endoscopy;  Laterality: N/A;  955  . CYSTOSCOPY/URETEROSCOPY/HOLMIUM LASER/STENT PLACEMENT Bilateral 12/06/2017   Procedure: CYSTOSCOPY/RETROGRADE/URETEROSCOPY/HOLMIUM LASER/STENT EXCHANGE;  Surgeon: Festus Aloe, MD;  Location: [facility];  Service: Urology;  Laterality: Bilateral;  ONLY NEEDS 60 MIN  . HERNIA REPAIR    . POLYPECTOMY  09/09/2017   Procedure: POLYPECTOMY;  Surgeon: Rogene Houston, MD;  [facility];  Service: Endoscopy;;  colon  . vocal cord surgery       Allergies  Allergen Reactions  .  Augmentin [Amoxicillin-Pot Clavulanate] Other (See Comments)    Increased liver enzymes Has patient had a PCN reaction causing immediate rash, facial/tongue/throat swelling, SOB or lightheadedness with hypotension: No Has patient had a PCN reaction causing severe rash involving mucus membranes or skin necrosis: No Has patient had a PCN reaction that required hospitalization: No Has patient had a PCN reaction occurring within the last 10 years: No  If all of the above answers are "NO", then may proceed with Cephalosporin use.   Marland Kitchen Flomax [Tamsulosin Hcl] Nausea Only    Dizziness       Family History  Problem Relation Age of Onset  . Breast cancer Mother   . Prostate cancer Father   . COPD Sister      Social History Mr. Wieck reports that he has never smoked. He has never used smokeless tobacco. Mr. Kronenberger reports that he drinks about 7.0 standard drinks of alcohol per week.   Review of Systems CONSTITUTIONAL: No weight loss, fever, chills, weakness or fatigue.  HEENT: Eyes: No visual loss, blurred vision, double vision or yellow sclerae.No hearing loss, sneezing, congestion, runny nose or sore throat.  SKIN: No rash or itching.  CARDIOVASCULAR: per hpi  RESPIRATORY: No shortness of breath, cough or sputum.  GASTROINTESTINAL: No anorexia, nausea, vomiting or diarrhea. No abdominal pain or blood.  GENITOURINARY: No burning on urination, no polyuria NEUROLOGICAL: No headache, dizziness, syncope, paralysis, ataxia, numbness or tingling in the extremities. No change in bowel or bladder control.  MUSCULOSKELETAL: No muscle, back pain, joint pain or stiffness.  LYMPHATICS: No enlarged nodes. No history of splenectomy.  PSYCHIATRIC: No history of depression or anxiety.  ENDOCRINOLOGIC: No reports of sweating, cold or heat intolerance. No polyuria or polydipsia.  Marland Kitchen   Physical Examination Vitals:   06/26/18 1113  BP: 136/79  Pulse: 70  SpO2: 96%   Vitals:   06/26/18 1113    Weight: 205 lb 3.2 oz (93.1 kg)  Height: 6' (1.829 m)    Gen: resting comfortably, no acute distress HEENT: no scleral icterus, pupils equal round and reactive, no palptable cervical adenopathy,  CV: RRR, 2/6 systolic murmur rusb, no jvd Resp: Clear to auscultation bilaterally GI: abdomen is soft, non-tender, non-distended, normal bowel sounds, no hepatosplenomegaly MSK: extremities are warm, no edema.  Skin: warm, no rash Neuro:  no focal deficits Psych: appropriate affect   Diagnostic Studies 11/2017 echo Study Conclusions  - Left ventricle: The cavity size was normal. Wall thickness was increased in a pattern of mild LVH. Systolic function was normal. The estimated ejection fraction was in the range of 55% to 60%. Wall motion was normal; there were no regional wall motion abnormalities. Doppler parameters are consistent with abnormal left ventricular relaxation (grade  1 diastolic dysfunction). - Aortic valve: Mildly calcified annulus. Mildly thickened leaflets. Mean gradient (S): 7 mm Hg. Valve area (VTI): 1.73 cm^2. - Mitral valve: Mildly calcified annulus. Mildly thickened leaflets . - Left atrium: The atrium was mildly to moderately dilated. - Technically adequate study.  11/2017 30 day event monitor  30 day event monitor  Min HR 44, Max HR 174, Avg HR 75  Telemetry tracings show sinus rhythm, with episodes of afib with RVR up to 180  No symptoms reported  Assessment and Plan  1. PAF - no recent symptoms. His CHADS2Vasc score is 2, continue eliquis  2. HTN - reasonable control, continue to follow bp trends   F/u 6 months.      Arnoldo Lenis, M.D.

## 2018-07-03 DIAGNOSIS — Z0000 Encounter for general adult medical examination without abnormal findings: Secondary | ICD-10-CM | POA: Diagnosis not present

## 2018-07-03 DIAGNOSIS — Z23 Encounter for immunization: Secondary | ICD-10-CM | POA: Diagnosis not present

## 2018-07-04 DIAGNOSIS — Z0000 Encounter for general adult medical examination without abnormal findings: Secondary | ICD-10-CM | POA: Diagnosis not present

## 2018-07-04 DIAGNOSIS — N480 Leukoplakia of penis: Secondary | ICD-10-CM | POA: Diagnosis not present

## 2018-07-04 DIAGNOSIS — N509 Disorder of male genital organs, unspecified: Secondary | ICD-10-CM | POA: Diagnosis not present

## 2018-07-04 DIAGNOSIS — I10 Essential (primary) hypertension: Secondary | ICD-10-CM | POA: Diagnosis not present

## 2018-07-04 DIAGNOSIS — R739 Hyperglycemia, unspecified: Secondary | ICD-10-CM | POA: Diagnosis not present

## 2018-07-04 DIAGNOSIS — T7840XS Allergy, unspecified, sequela: Secondary | ICD-10-CM | POA: Diagnosis not present

## 2018-07-04 DIAGNOSIS — Z125 Encounter for screening for malignant neoplasm of prostate: Secondary | ICD-10-CM | POA: Diagnosis not present

## 2018-07-04 DIAGNOSIS — K210 Gastro-esophageal reflux disease with esophagitis: Secondary | ICD-10-CM | POA: Diagnosis not present

## 2018-07-04 LAB — BASIC METABOLIC PANEL
BUN: 18 (ref 4–21)
Creatinine: 1.4 — AB (ref 0.6–1.3)
Glucose: 111
Potassium: 4.4 (ref 3.4–5.3)
Sodium: 142 (ref 137–147)

## 2018-07-04 LAB — TSH: TSH: 0.87 (ref <=5.90)

## 2018-07-04 LAB — LIPID PANEL
Cholesterol: 175 (ref 0–200)
HDL: 46 (ref 35–70)
LDL Cholesterol: 106
Triglycerides: 133 (ref 40–160)

## 2018-07-04 LAB — CBC AND DIFFERENTIAL: WBC: 8.3

## 2018-07-04 LAB — PSA: PSA: 2.2

## 2018-09-22 ENCOUNTER — Other Ambulatory Visit: Payer: Self-pay

## 2018-09-22 ENCOUNTER — Emergency Department (HOSPITAL_COMMUNITY): Payer: Medicare Other

## 2018-09-22 ENCOUNTER — Emergency Department (HOSPITAL_COMMUNITY)
Admission: EM | Admit: 2018-09-22 | Discharge: 2018-09-22 | Disposition: A | Discharge status: Home / Self Care | Payer: Medicare Other | Attending: Emergency Medicine | Admitting: Emergency Medicine

## 2018-09-22 ENCOUNTER — Encounter (HOSPITAL_COMMUNITY): Payer: Self-pay

## 2018-09-22 DIAGNOSIS — Z79899 Other long term (current) drug therapy: Secondary | ICD-10-CM | POA: Diagnosis not present

## 2018-09-22 DIAGNOSIS — Z7901 Long term (current) use of anticoagulants: Secondary | ICD-10-CM | POA: Insufficient documentation

## 2018-09-22 DIAGNOSIS — I10 Essential (primary) hypertension: Secondary | ICD-10-CM | POA: Insufficient documentation

## 2018-09-22 DIAGNOSIS — R42 Dizziness and giddiness: Secondary | ICD-10-CM | POA: Diagnosis not present

## 2018-09-22 DIAGNOSIS — I4891 Unspecified atrial fibrillation: Secondary | ICD-10-CM | POA: Insufficient documentation

## 2018-09-22 DIAGNOSIS — R200 Anesthesia of skin: Secondary | ICD-10-CM | POA: Diagnosis not present

## 2018-09-22 DIAGNOSIS — M5412 Radiculopathy, cervical region: Secondary | ICD-10-CM | POA: Diagnosis not present

## 2018-09-22 LAB — COMPREHENSIVE METABOLIC PANEL
ALT: 20 U/L (ref 0–44)
AST: 18 U/L (ref 15–41)
Albumin: 4 g/dL (ref 3.5–5.0)
Alkaline Phosphatase: 67 U/L (ref 38–126)
Anion gap: 6 (ref 5–15)
BUN: 14 mg/dL (ref 8–23)
CO2: 23 mmol/L (ref 22–32)
Calcium: 9.3 mg/dL (ref 8.9–10.3)
Chloride: 110 mmol/L (ref 98–111)
Creatinine, Ser: 1.14 mg/dL (ref 0.61–1.24)
GFR calc Af Amer: >60 mL/min (ref >60)
GFR calc non Af Amer: >60 mL/min (ref >60)
Glucose, Bld: 104 mg/dL — ABNORMAL HIGH (ref 70–99)
Potassium: 4 mmol/L (ref 3.5–5.1)
Sodium: 139 mmol/L (ref 135–145)
Total Bilirubin: 0.8 mg/dL (ref 0.3–1.2)
Total Protein: 7.8 g/dL (ref 6.5–8.1)

## 2018-09-22 LAB — CBC WITH DIFFERENTIAL/PLATELET
Abs Immature Granulocytes: 0.01 10*3/uL (ref 0.00–0.07)
Basophils Absolute: 0 10*3/uL (ref 0.0–0.1)
Basophils Relative: 1 %
Eosinophils Absolute: 0.5 10*3/uL (ref 0.0–0.5)
Eosinophils Relative: 7 %
HCT: 49 % (ref 39.0–52.0)
Hemoglobin: 15.5 g/dL (ref 13.0–17.0)
Immature Granulocytes: 0 %
Lymphocytes Relative: 20 %
Lymphs Abs: 1.4 10*3/uL (ref 0.7–4.0)
MCH: 28.7 pg (ref 26.0–34.0)
MCHC: 31.6 g/dL (ref 30.0–36.0)
MCV: 90.6 fL (ref 80.0–100.0)
Monocytes Absolute: 0.6 10*3/uL (ref 0.1–1.0)
Monocytes Relative: 8 %
Neutro Abs: 4.5 10*3/uL (ref 1.7–7.7)
Neutrophils Relative %: 64 %
Platelets: 238 10*3/uL (ref 150–400)
RBC: 5.41 MIL/uL (ref 4.22–5.81)
RDW: 12.5 % (ref 11.5–15.5)
WBC: 7 10*3/uL (ref 4.0–10.5)
nRBC: 0 % (ref 0.0–0.2)

## 2018-09-22 LAB — URINALYSIS, ROUTINE W REFLEX MICROSCOPIC
Bacteria, UA: NONE SEEN
Bilirubin Urine: NEGATIVE
Glucose, UA: NEGATIVE mg/dL
Ketones, ur: NEGATIVE mg/dL
Leukocytes, UA: NEGATIVE
Nitrite: NEGATIVE
Protein, ur: NEGATIVE mg/dL
Specific Gravity, Urine: 1.012 (ref 1.005–1.030)
pH: 6 (ref 5.0–8.0)

## 2018-09-22 LAB — TROPONIN I: Troponin I: <0.03 ng/mL (ref <0.03)

## 2018-09-22 NOTE — ED Notes (Signed)
MD aware of pt.

## 2018-09-22 NOTE — ED Notes (Signed)
Pt reports he lifted a 40lb bag of fertilizer yesterday and has R shoulder pain. Decreased sensation, pt describes as numbness, to his fourth and fifth fingers on R side. Strength or movement is not affected.

## 2018-09-22 NOTE — ED Triage Notes (Signed)
Pt states he woke up at 7:30 this morning and had some dizziness as well as right arm numbness. When the wife looked at his hand, she states it was very pale. Pt now has no neuro deficits, but states the right arm is hurting. No history of strokes, but does take Eliquis for A-fib. NIH scale of 0

## 2018-09-22 NOTE — ED Provider Notes (Signed)
[facility] Provider Note   CSN: [id number] Arrival date & time: 09/22/18  6237     History   Chief Complaint Chief Complaint  Patient presents with  . Numbness    HPI Darius Smith is a 73 y.o. male.  HPI Patient presented after episode of dizziness and right hand numbness.  States he woke up this morning was feeling fine went to the bathroom and then was attempting to get his pills.  States his right hand had been working fine.  States he then began to feel lightheaded.  Felt as if he was going to pass out things were going to go black.  States he sat down and realized his right hand felt a little numb.  It is on the fifth finger and the fourth finger.  Patient's wife states his hand looked pale at that time.  Hand is feeling somewhat better now but states he has had episodes where his hands felt a little weird previously.  Also some pain in his right-sided neck.  No chest pain.  No trouble breathing.  Has been doing well.  He is on Eliquis for atrial fibrillation and had a little bit of headache yesterday and earlier today but no headache now.  No vision changes.  No chest pain.  No trouble breathing.  History of atrial fibrillation. Past Medical History:  Diagnosis Date  . Arthritis    both hands  . Atrial fibrillation ([place]) 10/28/2017   Dr. Harl Bowie  . Blood in urine    occ  . Bronchitis   . Cancer (HCC)    Basal cell  . Cardiomegaly 10/28/2017  . Complication of anesthesia    Mr. Ledin developed a. fib in recovery after cysto procedure 10/2017 was transferred to [facility] and underwent successful cardioversion  . Diverticulosis of sigmoid colon 08/2017   Noted on colonoscopy  . Dysrhythmia   . External hemorrhoids 08/2017  . GERD (gastroesophageal reflux disease)   . History of colon polyps 08/2017  . History of kidney stones   . History of rectal bleeding   . History of right inguinal hernia   . Hypertension   . Shortness of breath     Patient Active  Problem List   Diagnosis Date Noted  . Rectal bleeding 09/02/2017  . Essential hypertension 08/08/2014  . GERD (gastroesophageal reflux disease) 08/08/2014    Past Surgical History:  Procedure Laterality Date  . CARDIOVERSION     10/2017  . CHOLECYSTECTOMY    . 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 CLEFT PALATE REPAIR     several from childhood til 73 years old  . COLONOSCOPY  04/28/2011   Procedure: COLONOSCOPY;  Surgeon: Rogene Houston, MD;  [facility];  Service: Endoscopy;  Laterality: N/A;  . COLONOSCOPY N/A 07/18/2014   Procedure: COLONOSCOPY;  Surgeon: Rogene Houston, MD;  [facility];  Service: Endoscopy;  Laterality: N/A;  830  . COLONOSCOPY N/A 09/09/2017   Procedure: COLONOSCOPY;  Surgeon: Rogene Houston, MD;  [facility];  Service: Endoscopy;  Laterality: N/A;  955  . CYSTOSCOPY/URETEROSCOPY/HOLMIUM LASER/STENT PLACEMENT Bilateral 12/06/2017   Procedure: CYSTOSCOPY/RETROGRADE/URETEROSCOPY/HOLMIUM LASER/STENT EXCHANGE;  Surgeon: Festus Aloe, MD;  [facility];  Service: Urology;  Laterality: Bilateral;  ONLY NEEDS 60 MIN  . HERNIA REPAIR    . POLYPECTOMY  09/09/2017   Procedure: POLYPECTOMY;  Surgeon: Rogene Houston, MD;  [facility];  Service: Endoscopy;;  colon  . vocal cord surgery          Home Medications    Prior to Admission medications   Medication Sig Start Date End Date Taking? Authorizing Provider  acetaminophen (TYLENOL) 500 MG tablet Take 1,000 mg by mouth every 6 (six) hours as needed for moderate pain or headache.   Yes [provider]  amLODipine (NORVASC) 10 MG tablet Take 10 mg by mouth daily. 08/12/17  Yes [provider]  benazepril (LOTENSIN) 40 MG tablet Take 40 mg by mouth daily. 08/12/17  Yes [provider]  cetirizine (ZYRTEC) 10 MG tablet Take 10 mg by mouth daily as needed for allergies.   Yes [provider]  ELIQUIS  5 MG TABS tablet TAKE ONE TABLET BY MOUTH 2 TIMES A DAY. 06/13/18  Yes Branch, Alphonse Guild, MD  HYDROcodone-homatropine [facility]) 5-1.5 MG/5ML syrup Take 5 mLs by mouth every 6 (six) hours as needed for cough.   Yes [provider]  metoprolol tartrate (LOPRESSOR) 25 MG tablet TAKE 1/2 TABLET BY MOUTH 2 TIMES A DAY. 03/27/18  Yes Branch, Alphonse Guild, MD  omeprazole (PRILOSEC) 20 MG capsule Take 20 mg by mouth daily as needed (for heartburn).    Yes [provider]  VENTOLIN HFA 108 (90 Base) MCG/ACT inhaler Inhale 2 puffs into the lungs every 6 (six) hours as needed for wheezing or shortness of breath.  08/01/17  Yes [provider]    Family History Family History  Problem Relation Age of Onset  . Breast cancer Mother   . Prostate cancer Father   . COPD Sister     Social History Social History   Tobacco Use  . Smoking status: Never Smoker  . Smokeless tobacco: Never Used  Substance Use Topics  . Alcohol use: Yes    Alcohol/week: 7.0 standard drinks    Types: 7 Cans of beer per week  . Drug use: No     Allergies   Augmentin [amoxicillin-pot clavulanate] and Flomax [tamsulosin hcl]   Review of Systems Review of Systems  Constitutional: Negative for activity change.  HENT: Negative for congestion.   Respiratory: Negative for choking.   Cardiovascular: Negative for chest pain.  Gastrointestinal: Negative for abdominal pain.  Genitourinary: Negative for flank pain.  Musculoskeletal: Positive for neck pain. Negative for back pain.  Neurological: Positive for light-headedness, numbness and headaches. Negative for weakness.  Hematological: Negative for adenopathy.  Psychiatric/Behavioral: Negative for confusion.     Physical Exam Updated Vital Signs BP (!) 148/92   Pulse 66   Temp 98.1 F (36.7 C) (Oral)   Resp 19   Ht 6' (1.829 m)   Wt 89.4 kg   SpO2 96%   BMI 26.72 kg/m   Physical Exam HENT:     Head: Atraumatic.  Eyes:     Extraocular  Movements: Extraocular movements intact.  Neck:     Musculoskeletal: Neck supple.  Cardiovascular:     Rate and Rhythm: Regular rhythm.  Pulmonary:     Effort: Pulmonary effort is normal.  Abdominal:     General: Abdomen is flat.     Tenderness: There is no abdominal tenderness.  Musculoskeletal:        General: No deformity.     Right lower leg: No edema.     Left lower leg: No edema.     Comments: Strong radial pulse in bilateral hands.  Some tenderness over right paraspinal musculature on neck.  Skin:    General: Skin is warm.     Capillary Refill: Capillary refill takes less than 2 seconds.  Neurological:     General: No focal deficit present.     Mental Status: He is alert.     Comments: Good strength in bilateral hands.  Radial median and ulnar sensation grossly intact bilaterally.  Face symmetric.      ED Treatments / Results  Labs (all labs ordered are listed, but only abnormal results are displayed) Labs Reviewed  COMPREHENSIVE METABOLIC PANEL - Abnormal; Notable for the following components:      Result Value   Glucose, Bld 104 (*)    All other components within normal limits  URINALYSIS, ROUTINE W REFLEX MICROSCOPIC - Abnormal; Notable for the following components:   Hgb urine dipstick SMALL (*)    All other components within normal limits  TROPONIN I  CBC WITH DIFFERENTIAL/PLATELET    EKG EKG Interpretation  Date/Time:  Friday September 22 2018 09:47:27 EST Ventricular Rate:  60 PR Interval:    QRS Duration: 119 QT Interval:  416 QTC Calculation: 416 R Axis:   50 Text Interpretation:  Sinus rhythm Nonspecific intraventricular conduction delay Confirmed by Davonna Belling [phone]) on 09/22/2018 10:13:48 AM   Radiology Ct Head Wo Contrast  Result Date: 09/22/2018 CLINICAL DATA:  Dizziness and right arm numbness. On Eliquis for atrial fibrillation. EXAM: CT HEAD WITHOUT CONTRAST TECHNIQUE: Contiguous axial images were obtained from the base of the skull  through the vertex without intravenous contrast. COMPARISON:  None. FINDINGS: Brain: No acute large territory infarct, intracranial hemorrhage, mass, midline shift, or extra-axial fluid collection is identified. There is subtle asymmetric hypoattenuation in the left thalamus without a well-defined infarct evident. Mild cerebral atrophy is within normal limits for age. Patchy bilateral cerebral white matter hypodensities are nonspecific but compatible with mild chronic small vessel ischemic disease. 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 Vascular: No hyperdense vessel. Skull: No fracture or focal osseous lesion. Sinuses/Orbits: Moderate mucosal thickening in the paranasal sinuses. Underdeveloped mastoid air cells. Unremarkable orbits. Other: None. IMPRESSION: 1. No evidence of acute large territory infarct or intracranial hemorrhage. 2. Lacunar infarct versus artifact in the left thalamus. 3. Mild chronic small vessel ischemic disease. Electronically Signed   By: Logan Bores M.D.   On: 09/22/2018 11:21    Procedures Procedures (including critical care time)  Medications Ordered in ED Medications - No data to display   Initial Impression / Assessment and Plan / ED Course  I have reviewed the triage vital signs and the nursing notes.  Pertinent labs & imaging results that were available during my care of the patient were reviewed by me and considered in my medical decision making (see chart for details).     Patient with episode of lightheadedness.  Think less likely arrhythmia may have been something like a vagal near syncope.  Feels much better.  Lab work overall reassuring.  Right arm/hand issues likely musculoskeletal.  Potentially cervical radiculopathy since it is on fourth and fifth fingers and has some neck pain.  Will discharge home to follow-up as an outpatient as needed.  Final Clinical Impressions(s) / ED Diagnoses   Final  diagnoses:  Cervical radiculopathy  Lightheadedness    ED Discharge Orders    None       Davonna Belling, MD 09/22/18 1243

## 2018-10-03 DIAGNOSIS — K210 Gastro-esophageal reflux disease with esophagitis: Secondary | ICD-10-CM | POA: Diagnosis not present

## 2018-10-03 DIAGNOSIS — I10 Essential (primary) hypertension: Secondary | ICD-10-CM | POA: Diagnosis not present

## 2018-10-03 DIAGNOSIS — I4891 Unspecified atrial fibrillation: Secondary | ICD-10-CM | POA: Diagnosis not present

## 2018-10-03 DIAGNOSIS — N401 Enlarged prostate with lower urinary tract symptoms: Secondary | ICD-10-CM | POA: Diagnosis not present

## 2018-10-04 DIAGNOSIS — Z0120 Encounter for dental examination and cleaning without abnormal findings: Secondary | ICD-10-CM | POA: Diagnosis not present

## 2018-10-04 DIAGNOSIS — H5203 Hypermetropia, bilateral: Secondary | ICD-10-CM | POA: Diagnosis not present

## 2018-12-11 ENCOUNTER — Other Ambulatory Visit: Payer: Self-pay | Admitting: Cardiology

## 2018-12-21 ENCOUNTER — Telehealth: Payer: Self-pay | Admitting: *Deleted

## 2018-12-21 NOTE — Telephone Encounter (Signed)
Pt contacted per Dr Harl Bowie. History reviewed. No symptoms to suggest any unstable cardiac conditions. Based on discussion, with current pandemic situation, we have postponed 12/26/18 appointment until June 2020. If symptoms change, pt has been instructed to contact our office

## 2018-12-26 ENCOUNTER — Ambulatory Visit: Payer: Medicare Other | Admitting: Cardiology

## 2019-02-01 DIAGNOSIS — I10 Essential (primary) hypertension: Secondary | ICD-10-CM | POA: Diagnosis not present

## 2019-02-01 DIAGNOSIS — I4891 Unspecified atrial fibrillation: Secondary | ICD-10-CM | POA: Diagnosis not present

## 2019-02-01 DIAGNOSIS — K210 Gastro-esophageal reflux disease with esophagitis: Secondary | ICD-10-CM | POA: Diagnosis not present

## 2019-02-01 DIAGNOSIS — J301 Allergic rhinitis due to pollen: Secondary | ICD-10-CM | POA: Diagnosis not present

## 2019-02-28 ENCOUNTER — Encounter: Payer: Self-pay | Admitting: Cardiology

## 2019-02-28 ENCOUNTER — Telehealth (INDEPENDENT_AMBULATORY_CARE_PROVIDER_SITE_OTHER): Payer: Medicare Other | Admitting: Cardiology

## 2019-02-28 VITALS — BP 122/77 | HR 54 | Ht 72.0 in | Wt 195.0 lb

## 2019-02-28 DIAGNOSIS — I480 Paroxysmal atrial fibrillation: Secondary | ICD-10-CM

## 2019-02-28 DIAGNOSIS — I10 Essential (primary) hypertension: Secondary | ICD-10-CM

## 2019-02-28 NOTE — Progress Notes (Signed)
Virtual Visit via Telephone Note   This visit type was conducted due to national recommendations for restrictions regarding the COVID-19 Pandemic (e.g. social distancing) in an effort to limit this patient's exposure and mitigate transmission in our community.  Due to his co-morbid illnesses, this patient is at least at moderate risk for complications without adequate follow up.  This format is felt to be most appropriate for this patient at this time.  The patient did not have access to video technology/had technical difficulties with video requiring transitioning to audio format only (telephone).  All issues noted in this document were discussed and addressed.  No physical exam could be performed with this format.  Please refer to the patient's chart for his  consent to telehealth for [facility].   Date:  02/28/2019   ID:  Darius Smith, DOB 1945-11-03, MRN [id number]  Patient Location: Home Provider Location: Office  PCP:  Sinda Du, MD  Cardiologist:  Carlyle Dolly, MD  Electrophysiologist:  None   Evaluation Performed:  Follow-Up Visit  Chief Complaint:  6 month follow up   History of Present Illness:    Darius Smith is a 73 y.o. male seen today for follow up of the following medical problems.  1. PAF - on lopressor, eliquis for stroke prevention   - no recent palpitations. Heart rates 60s at home.  - compliant with meds. No bleeding on eliquis.  2. HTN -compliant withmeds     The patient does not have symptoms concerning for COVID-19 infection (fever, chills, cough, or new shortness of breath).    Past Medical History:  Diagnosis Date  . Arthritis    both hands  . Atrial fibrillation ([place]) 10/28/2017   Dr. Harl Bowie  . Blood in urine    occ  . Bronchitis   . Cancer (HCC)    Basal cell  . Cardiomegaly 10/28/2017  . Complication of anesthesia    Mr. Trickett developed a. fib in recovery after cysto procedure 10/2017 was transferred to [facility] and  underwent successful cardioversion  . Diverticulosis of sigmoid colon 08/2017   Noted on colonoscopy  . Dysrhythmia   . External hemorrhoids 08/2017  . GERD (gastroesophageal reflux disease)   . History of colon polyps 08/2017  . History of kidney stones   . History of rectal bleeding   . History of right inguinal hernia   . Hypertension   . Shortness of breath    Past Surgical History:  Procedure Laterality Date  . CARDIOVERSION     10/2017  . CHOLECYSTECTOMY    . 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 CLEFT PALATE REPAIR     several from childhood til 73 years old  . COLONOSCOPY  04/28/2011   Procedure: COLONOSCOPY;  Surgeon: Rogene Houston, MD;  [facility];  Service: Endoscopy;  Laterality: N/A;  . COLONOSCOPY N/A 07/18/2014   Procedure: COLONOSCOPY;  Surgeon: Rogene Houston, MD;  [facility];  Service: Endoscopy;  Laterality: N/A;  830  . COLONOSCOPY N/A 09/09/2017   Procedure: COLONOSCOPY;  Surgeon: Rogene Houston, MD;  [facility];  Service: Endoscopy;  Laterality: N/A;  955  . CYSTOSCOPY/URETEROSCOPY/HOLMIUM LASER/STENT PLACEMENT Bilateral 12/06/2017   Procedure: CYSTOSCOPY/RETROGRADE/URETEROSCOPY/HOLMIUM LASER/STENT EXCHANGE;  Surgeon: Festus Aloe, MD;  [facility]: [facility];  Service: Urology;  Laterality: Bilateral;  ONLY NEEDS 60 MIN  . HERNIA REPAIR    . POLYPECTOMY  09/09/2017  Procedure: POLYPECTOMY;  Surgeon: Rogene Houston, MD;  [facility];  Service: Endoscopy;;  colon  . vocal cord surgery       No outpatient medications have been marked as taking for the 02/28/19 encounter (Appointment) with Arnoldo Lenis, MD.     Allergies:   Augmentin [amoxicillin-pot clavulanate] and Flomax [tamsulosin hcl]   Social History   Tobacco Use  . Smoking status: Never Smoker  . Smokeless tobacco: Never Used  Substance Use Topics  . Alcohol use: Yes    Alcohol/week: 7.0 standard drinks     Types: 7 Cans of beer per week  . Drug use: No     Family Hx: The patient's family history includes Breast cancer in his mother; COPD in his sister; Prostate cancer in his father.  ROS:   Please see the history of present illness.    All other systems reviewed and are negative.   Prior CV studies:   The following studies were reviewed today:  11/2017 echo Study Conclusions  - Left ventricle: The cavity size was normal. Wall thickness was increased in a pattern of mild LVH. Systolic function was normal. The estimated ejection fraction was in the range of 55% to 60%. Wall motion was normal; there were no regional wall motion abnormalities. Doppler parameters are consistent with abnormal left ventricular relaxation (grade 1 diastolic dysfunction). - Aortic valve: Mildly calcified annulus. Mildly thickened leaflets. Mean gradient (S): 7 mm Hg. Valve area (VTI): 1.73 cm^2. - Mitral valve: Mildly calcified annulus. Mildly thickened leaflets . - Left atrium: The atrium was mildly to moderately dilated. - Technically adequate study.  11/2017 30 day event monitor  30 day event monitor  Min HR 44, Max HR 174, Avg HR 75  Telemetry tracings show sinus rhythm, with episodes of afib with RVR up to 180  No symptoms reported  Labs/Other Tests and Data Reviewed:    EKG:  No ECG reviewed.  Recent Labs: 09/22/2018: ALT 20; BUN 14; Creatinine, Ser 1.14; Hemoglobin 15.5; Platelets 238; Potassium 4.0; Sodium 139   Recent Lipid Panel No results found for: CHOL, TRIG, HDL, CHOLHDL, LDLCALC, LDLDIRECT  Wt Readings from Last 3 Encounters:  09/22/18 197 lb (89.4 kg)  06/26/18 205 lb 3.2 oz (93.1 kg)  12/27/17 201 lb (91.2 kg)     Objective:    Vital Signs:   Today's Vitals   02/28/19 1350  BP: 122/77  Pulse: (!) 54  Weight: 195 lb (88.5 kg)  Height: 6' (1.829 m)   Body mass index is 26.45 kg/m.   Normal affect. Normal speech pattern and tone. Comfortable, no  apparent distress, No audible signs of SOB or wheezing.   ASSESSMENT & PLAN:    1.PAF -- no symptoms, continue current meds including anticoag for stroke prevention  2. HTN - remains at goal, continue current meds   F/u 6 months.   COVID-19 Education: The signs and symptoms of COVID-19 were discussed with the patient and how to seek care for testing (follow up with PCP or arrange E-visit).  The importance of social distancing was discussed today.  Time:   Today, I have spent 12 minutes with the patient with telehealth technology discussing the above problems.     Medication Adjustments/Labs and Tests Ordered: Current medicines are reviewed at length with the patient today.  Concerns regarding medicines are outlined above.   Tests Ordered: No orders of the defined types were placed in this encounter.   Medication Changes: No orders  of the defined types were placed in this encounter.   Disposition:  Follow up 6 months  Signed, Carlyle Dolly, MD  02/28/2019 12:44 PM    [place]

## 2019-02-28 NOTE — Patient Instructions (Signed)

## 2019-04-04 DIAGNOSIS — L820 Inflamed seborrheic keratosis: Secondary | ICD-10-CM | POA: Diagnosis not present

## 2019-04-04 DIAGNOSIS — D485 Neoplasm of uncertain behavior of skin: Secondary | ICD-10-CM | POA: Diagnosis not present

## 2019-04-04 DIAGNOSIS — L570 Actinic keratosis: Secondary | ICD-10-CM | POA: Diagnosis not present

## 2019-04-04 DIAGNOSIS — L821 Other seborrheic keratosis: Secondary | ICD-10-CM | POA: Diagnosis not present

## 2019-06-04 DIAGNOSIS — I4891 Unspecified atrial fibrillation: Secondary | ICD-10-CM | POA: Diagnosis not present

## 2019-06-04 DIAGNOSIS — I10 Essential (primary) hypertension: Secondary | ICD-10-CM | POA: Diagnosis not present

## 2019-06-04 DIAGNOSIS — J301 Allergic rhinitis due to pollen: Secondary | ICD-10-CM | POA: Diagnosis not present

## 2019-06-04 DIAGNOSIS — N401 Enlarged prostate with lower urinary tract symptoms: Secondary | ICD-10-CM | POA: Diagnosis not present

## 2019-06-12 DIAGNOSIS — Z0120 Encounter for dental examination and cleaning without abnormal findings: Secondary | ICD-10-CM | POA: Diagnosis not present

## 2019-07-02 ENCOUNTER — Other Ambulatory Visit: Payer: Self-pay

## 2019-07-02 ENCOUNTER — Encounter: Payer: Self-pay | Admitting: Family Medicine

## 2019-07-02 ENCOUNTER — Ambulatory Visit (INDEPENDENT_AMBULATORY_CARE_PROVIDER_SITE_OTHER): Payer: Medicare Other | Admitting: Family Medicine

## 2019-07-02 VITALS — BP 144/87 | HR 53 | Temp 98.3°F | Ht 72.0 in | Wt 204.4 lb

## 2019-07-02 DIAGNOSIS — Z125 Encounter for screening for malignant neoplasm of prostate: Secondary | ICD-10-CM

## 2019-07-02 DIAGNOSIS — K219 Gastro-esophageal reflux disease without esophagitis: Secondary | ICD-10-CM

## 2019-07-02 DIAGNOSIS — J309 Allergic rhinitis, unspecified: Secondary | ICD-10-CM

## 2019-07-02 DIAGNOSIS — N200 Calculus of kidney: Secondary | ICD-10-CM

## 2019-07-02 DIAGNOSIS — I10 Essential (primary) hypertension: Secondary | ICD-10-CM

## 2019-07-02 HISTORY — DX: Allergic rhinitis, unspecified: J30.9

## 2019-07-02 HISTORY — DX: Calculus of kidney: N20.0

## 2019-07-02 NOTE — Progress Notes (Addendum)
New Patient Office Visit  Subjective:  Patient ID: Darius Smith, male    DOB: 1946/09/01  Age: 73 y.o. MRN: [id number]  CC:  Chief Complaint  Patient presents with  . Establish Care    HPI Darius Smith presents for afib-PAF-HTN-sees cardio-Dr. Ursula Beath appt 6/20 Kidney stone-during surgery pt went into afib-converted 2/19-sees urology-Dr. Eskridge-cystoscopy Cataract-surgery Colonoscopy-polyps x2-5 year f/u AR-zyrtec prn GERD-omeprazole Abnormal renal function-Cr 50(19) A1c 5.8%(19) PSA 2.2(19)  Past Medical History:  Diagnosis Date  . Arthritis    both hands  . Atrial fibrillation ([place]) 10/28/2017   Dr. Harl Bowie  . Blood in urine    occ  . Bronchitis   . Cancer (HCC)    Basal cell  . Cardiomegaly 10/28/2017  . Complication of anesthesia    Darius Smith developed a. fib in recovery after cysto procedure 10/2017 was transferred to [facility] and underwent successful cardioversion  . Diverticulosis of sigmoid colon 08/2017   Noted on colonoscopy  . Dysrhythmia   . External hemorrhoids 08/2017  . GERD (gastroesophageal reflux disease)   . History of colon polyps 08/2017  . History of kidney stones   . History of rectal bleeding   . History of right inguinal hernia   . Hypertension   . Shortness of breath     Past Surgical History:  Procedure Laterality Date  . CARDIOVERSION     10/2017  . CHOLECYSTECTOMY    . 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 CLEFT PALATE REPAIR     several from childhood til 73 years old  . COLONOSCOPY  04/28/2011   Procedure: COLONOSCOPY;  Surgeon: Rogene Houston, MD;  [facility];  Service: Endoscopy;  Laterality: N/A;  . COLONOSCOPY N/A 07/18/2014   Procedure: COLONOSCOPY;  Surgeon: Rogene Houston, MD;  [facility];  Service: Endoscopy;  Laterality: N/A;  830  . COLONOSCOPY N/A 09/09/2017   Procedure: COLONOSCOPY;  Surgeon: Rogene Houston, MD;  [facility];  Service:  Endoscopy;  Laterality: N/A;  955  . CYSTOSCOPY/URETEROSCOPY/HOLMIUM LASER/STENT PLACEMENT Bilateral 12/06/2017   Procedure: CYSTOSCOPY/RETROGRADE/URETEROSCOPY/HOLMIUM LASER/STENT EXCHANGE;  Surgeon: Festus Aloe, MD;  [facility];  Service: Urology;  Laterality: Bilateral;  ONLY NEEDS 60 MIN  . HERNIA REPAIR    . POLYPECTOMY  09/09/2017   Procedure: POLYPECTOMY;  Surgeon: Rogene Houston, MD;  [facility];  Service: Endoscopy;;  colon  . vocal cord surgery      Family History  Problem Relation Age of Onset  . Breast cancer Mother   . Prostate cancer Father   . COPD Sister     Social History   Socioeconomic History  . Marital status: Married    Spouse name: Not on file  . Number of children: Not on file  . Years of education: Not on file  . Highest education level: Not on file  Occupational History  . Not on file  Social Needs  . Financial resource strain: Not on file  . Food insecurity    Worry: Not on file    Inability: Not on file  . Transportation needs    Medical: Not on file    Non-medical: Not on file  Tobacco Use  . Smoking status: Never Smoker  . Smokeless tobacco: Never Used  Substance and Sexual Activity  . Alcohol use: Yes    Alcohol/week: 7.0 standard drinks    Types: 7 Cans of  beer per week  . Drug use: No  . Sexual activity: Not on file  Lifestyle  . Physical activity    Days per week: Not on file    Minutes per session: Not on file  . Stress: Not on file  Relationships  . Social Herbalist on phone: Not on file    Gets together: Not on file    Attends religious service: Not on file    Active member of club or organization: Not on file    Attends meetings of clubs or organizations: Not on file    Relationship status: Not on file  . Intimate partner violence    Fear of current or ex partner: Not on file    Emotionally abused: Not on file    Physically abused: Not on file    Forced sexual activity: Not on file   Other Topics Concern  . Not on file  Social History Narrative  . Not on file    ROS Review of Systems  Constitutional: Negative.   HENT: Positive for hearing loss.        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   Cardiovascular:       Afib cardiovert  Gastrointestinal: Negative.        Colon polyps  Genitourinary:       Kidney stone  Musculoskeletal: Positive for arthralgias and myalgias.  Skin:       Derm-yearly-3 lesions removed  Allergic/Immunologic: Positive for environmental allergies.  Neurological: Negative.   Hematological: Negative.   Psychiatric/Behavioral: Negative.     Objective:   Today's Vitals: BP (!) 144/87 (BP Location: Left Arm, Patient Position: Sitting, Cuff Size: Normal)   Pulse (!) 53   Temp 98.3 F (36.8 C) (Oral)   Ht 6' (1.829 m)   Wt 204 lb 6.4 oz (92.7 kg)   SpO2 94%   BMI 27.72 kg/m   Physical Exam Constitutional:      Appearance: Normal appearance.  HENT:     Head: Normocephalic and atraumatic.     Right Ear: Tympanic membrane, ear canal and external ear normal.     Left Ear: Tympanic membrane, ear canal and external ear normal.     Nose: Nose normal.     Comments: Palate repair-well healed Eyes:     General: Scleral icterus: psa.     Conjunctiva/sclera: Conjunctivae normal.  Neck:     Musculoskeletal: Normal range of motion and neck supple.  Cardiovascular:     Rate and Rhythm: Normal rate and regular rhythm.     Pulses: Normal pulses.     Heart sounds: Normal heart sounds.  Pulmonary:     Effort: Pulmonary effort is normal.     Breath sounds: Normal breath sounds.  Abdominal:     Palpations: Abdomen is soft.  Musculoskeletal: Normal range of motion.  Neurological:     Mental Status: He is alert and oriented to person, place, and time.  Psychiatric:        Mood and Affect: Mood normal.        Behavior: Behavior normal.     Assessment & Plan:   Outpatient  Encounter Medications as of 07/02/2019  Medication Sig  . acetaminophen (TYLENOL) 500 MG tablet Take 1,000 mg by mouth every 6 (six) hours as needed for moderate pain or headache.  Marland Kitchen amLODipine (NORVASC) 10 MG tablet Take 10 mg by mouth daily.  Marland Kitchen  benazepril (LOTENSIN) 40 MG tablet Take 40 mg by mouth daily.  . cetirizine (ZYRTEC) 10 MG tablet Take 10 mg by mouth daily as needed for allergies.  Marland Kitchen ELIQUIS 5 MG TABS tablet TAKE ONE TABLET BY MOUTH 2 TIMES A DAY.  . metoprolol tartrate (LOPRESSOR) 25 MG tablet TAKE 1/2 TABLET BY MOUTH 2 TIMES A DAY.  Marland Kitchen omeprazole (PRILOSEC) 20 MG capsule Take 20 mg by mouth daily as needed (for heartburn).   . VENTOLIN HFA 108 (90 Base) MCG/ACT inhaler Inhale 2 puffs into the lungs every 6 (six) hours as needed for wheezing or shortness of breath.    No facility-administered encounter medications on file as of 07/02/2019.     Follow-up: 1. Essential hypertension Lotensin/Lopressor/Norvsc  2. Gastroesophageal reflux disease without esophagitis omeprazole  3. Allergic rhinitis, unspecified seasonality, unspecified trigger Zyrtec-better since rain 4. Nephrolithiasis Sees urology-resolved with removal-no blood in urine Flu and pneumonia vaccines completed at pharmacy Eye exam yearly Olena Willy Hannah Beat, MD

## 2019-07-09 ENCOUNTER — Other Ambulatory Visit: Payer: Self-pay | Admitting: Cardiology

## 2019-07-09 DIAGNOSIS — Z125 Encounter for screening for malignant neoplasm of prostate: Secondary | ICD-10-CM | POA: Diagnosis not present

## 2019-07-09 DIAGNOSIS — K219 Gastro-esophageal reflux disease without esophagitis: Secondary | ICD-10-CM | POA: Diagnosis not present

## 2019-07-09 DIAGNOSIS — I10 Essential (primary) hypertension: Secondary | ICD-10-CM | POA: Diagnosis not present

## 2019-07-09 LAB — COMPLETE METABOLIC PANEL WITH GFR
AG Ratio: 1.2 (calc) (ref 1.0–2.5)
ALT: 11 U/L (ref 9–46)
AST: 10 U/L (ref 10–35)
Albumin: 3.9 g/dL (ref 3.6–5.1)
Alkaline phosphatase (APISO): 61 U/L (ref 35–144)
BUN/Creatinine Ratio: 12 (calc) (ref 6–22)
BUN: 19 mg/dL (ref 7–25)
CO2: 28 mmol/L (ref 20–32)
Calcium: 9 mg/dL (ref 8.6–10.3)
Chloride: 108 mmol/L (ref 98–110)
Creat: 1.56 mg/dL — ABNORMAL HIGH (ref 0.70–1.18)
GFR, Est African American: 50 mL/min/{1.73_m2} — ABNORMAL LOW (ref >=60)
GFR, Est Non African American: 43 mL/min/{1.73_m2} — ABNORMAL LOW (ref >=60)
Globulin: 3.2 g/dL (calc) (ref 1.9–3.7)
Glucose, Bld: 121 mg/dL — ABNORMAL HIGH (ref 65–99)
Potassium: 4.3 mmol/L (ref 3.5–5.3)
Sodium: 141 mmol/L (ref 135–146)
Total Bilirubin: 0.8 mg/dL (ref 0.2–1.2)
Total Protein: 7.1 g/dL (ref 6.1–8.1)

## 2019-07-09 LAB — PSA: PSA: 2.8 ng/mL (ref <=4.0)

## 2019-07-10 ENCOUNTER — Other Ambulatory Visit: Payer: Self-pay | Admitting: Family Medicine

## 2019-07-10 DIAGNOSIS — N289 Disorder of kidney and ureter, unspecified: Secondary | ICD-10-CM

## 2019-08-20 DIAGNOSIS — R351 Nocturia: Secondary | ICD-10-CM | POA: Diagnosis not present

## 2019-08-20 DIAGNOSIS — N401 Enlarged prostate with lower urinary tract symptoms: Secondary | ICD-10-CM | POA: Diagnosis not present

## 2019-08-31 ENCOUNTER — Other Ambulatory Visit: Payer: Self-pay

## 2019-08-31 DIAGNOSIS — Z20822 Contact with and (suspected) exposure to covid-19: Secondary | ICD-10-CM

## 2019-09-02 LAB — NOVEL CORONAVIRUS, NAA: SARS-CoV-2, NAA: NOT DETECTED

## 2019-09-04 ENCOUNTER — Telehealth (INDEPENDENT_AMBULATORY_CARE_PROVIDER_SITE_OTHER): Payer: Medicare Other | Admitting: Family Medicine

## 2019-09-04 ENCOUNTER — Encounter: Payer: Self-pay | Admitting: Family Medicine

## 2019-09-04 VITALS — BP 120/79 | Ht 72.0 in | Wt 194.0 lb

## 2019-09-04 DIAGNOSIS — Z79899 Other long term (current) drug therapy: Secondary | ICD-10-CM

## 2019-09-04 DIAGNOSIS — I10 Essential (primary) hypertension: Secondary | ICD-10-CM

## 2019-09-04 DIAGNOSIS — I480 Paroxysmal atrial fibrillation: Secondary | ICD-10-CM

## 2019-09-04 DIAGNOSIS — Z7901 Long term (current) use of anticoagulants: Secondary | ICD-10-CM | POA: Diagnosis not present

## 2019-09-04 NOTE — Patient Instructions (Signed)

## 2019-09-04 NOTE — Progress Notes (Signed)
Virtual Visit via Telephone Note   This visit type was conducted due to national recommendations for restrictions regarding the COVID-19 Pandemic (e.g. social distancing) in an effort to limit this patient's exposure and mitigate transmission in our community.  Due to his co-morbid illnesses, this patient is at least at moderate risk for complications without adequate follow up.  This format is felt to be most appropriate for this patient at this time.  The patient did not have access to video technology/had technical difficulties with video requiring transitioning to audio format only (telephone).  All issues noted in this document were discussed and addressed.  No physical exam could be performed with this format.  Please refer to the patient's chart for his  consent to telehealth for [facility].   Date:  09/04/2019   ID:  Darius Smith, DOB 02-28-1946, MRN [id number]  Patient Location: Home Provider Location: Office  PCP:  Maryruth Hancock, MD  Cardiologist:  Carlyle Dolly, MD  Electrophysiologist:  None   Evaluation Performed:  Follow-Up Visit  Chief Complaint: Follow-up for paroxysmal atrial fibrillation and hypertension  History of Present Illness:    Darius Smith is a 73 y.o. male last encounter with Dr. Harl Bowie via telemedicine February 28, 2019.  Patient states she has occasional brief/transient palpitations which last maybe 30 seconds and resolve.  He is asymptomatic when these occur.  Denies any syncopal or near syncopal episodes/lightheadedness or dizziness.  He states he has been walking approximately 2 miles a [place] without any progressive anginal or exertional symptoms.  States he always feels better after he walks.  History of hypertension.  He is compliant with all medications.  Medications include amlodipine 10 mg daily, benazepril 40 mg daily, metoprolol 25 mg 1/2 tablet by mouth twice daily.  Blood pressure is within normal limits today.  The patient  does not have symptoms concerning for COVID-19 infection (fever, chills, cough, or new shortness of breath).    Past Medical History:  Diagnosis Date  . Arthritis    both hands  . Atrial fibrillation ([place]) 10/28/2017   Dr. Harl Bowie  . Blood in urine    occ  . Bronchitis   . Cancer (HCC)    Basal cell  . Cardiomegaly 10/28/2017  . Complication of anesthesia    Mr. Nicasio developed a. fib in recovery after cysto procedure 10/2017 was transferred to [facility] and underwent successful cardioversion  . Diverticulosis of sigmoid colon 08/2017   Noted on colonoscopy  . Dysrhythmia   . External hemorrhoids 08/2017  . GERD (gastroesophageal reflux disease)   . History of colon polyps 08/2017  . History of kidney stones   . History of rectal bleeding   . History of right inguinal hernia   . Hypertension   . Shortness of breath    Past Surgical History:  Procedure Laterality Date  . CARDIOVERSION     10/2017  . CHOLECYSTECTOMY    . 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 CLEFT PALATE REPAIR     several from childhood til 73 years old  . COLONOSCOPY  04/28/2011   Procedure: COLONOSCOPY;  Surgeon: Rogene Houston, MD;  [facility];  Service: Endoscopy;  Laterality: N/A;  . COLONOSCOPY N/A 07/18/2014   Procedure: COLONOSCOPY;  Surgeon: Rogene Houston, MD;  [facility];  Service: Endoscopy;  Laterality: N/A;  830  . COLONOSCOPY N/A 09/09/2017  Procedure: COLONOSCOPY;  Surgeon: Rogene Houston, MD;  [facility];  Service: Endoscopy;  Laterality: N/A;  955  . CYSTOSCOPY/URETEROSCOPY/HOLMIUM LASER/STENT PLACEMENT Bilateral 12/06/2017   Procedure: CYSTOSCOPY/RETROGRADE/URETEROSCOPY/HOLMIUM LASER/STENT EXCHANGE;  Surgeon: Festus Aloe, MD;  [facility]S;  Service: Urology;  Laterality: Bilateral;  ONLY NEEDS 60 MIN  . HERNIA REPAIR    . POLYPECTOMY  09/09/2017   Procedure: POLYPECTOMY;  Surgeon: Rogene Houston, MD;  [facility];  Service: Endoscopy;;  colon  . vocal cord surgery       Current Meds  Medication Sig  . acetaminophen (TYLENOL) 500 MG tablet Take 1,000 mg by mouth every 6 (six) hours as needed for moderate pain or headache.  Marland Kitchen amLODipine (NORVASC) 10 MG tablet Take 10 mg by mouth daily.  . benazepril (LOTENSIN) 40 MG tablet Take 40 mg by mouth daily.  . cetirizine (ZYRTEC) 10 MG tablet Take 10 mg by mouth daily as needed for allergies.  Marland Kitchen ELIQUIS 5 MG TABS tablet TAKE ONE TABLET BY MOUTH 2 TIMES A DAY.  . metoprolol tartrate (LOPRESSOR) 25 MG tablet TAKE 1/2 TABLET BY MOUTH 2 TIMES A DAY.  Marland Kitchen omeprazole (PRILOSEC) 20 MG capsule Take 20 mg by mouth daily as needed (for heartburn).   . VENTOLIN HFA 108 (90 Base) MCG/ACT inhaler Inhale 2 puffs into the lungs every 6 (six) hours as needed for wheezing or shortness of breath.      Allergies:   Augmentin [amoxicillin-pot clavulanate] and Flomax [tamsulosin hcl]   Social History   Tobacco Use  . Smoking status: Never Smoker  . Smokeless tobacco: Never Used  Substance Use Topics  . Alcohol use: Yes    Alcohol/week: 7.0 standard drinks    Types: 7 Cans of beer per week  . Drug use: No     Family Hx: The patient's family history includes Breast cancer in his mother; COPD in his sister; Prostate cancer in his father.  ROS:   Please see the history of present illness.    All other systems reviewed and are negative.   Prior CV studies:   The following studies were reviewed today:  11/2017 echo Study Conclusions  - Left ventricle: The cavity size was normal. Wall thickness was increased in a pattern of mild LVH. Systolic function was normal. The estimated ejection fraction was in the range of 55% to 60%. Wall motion was normal; there were no regional wall motion abnormalities. Doppler parameters are consistent with abnormal left ventricular relaxation (grade 1 diastolic dysfunction). - Aortic valve: Mildly calcified  annulus. Mildly thickened leaflets. Mean gradient (S): 7 mm Hg. Valve area (VTI): 1.73 cm^2. - Mitral valve: Mildly calcified annulus. Mildly thickened leaflets . - Left atrium: The atrium was mildly to moderately dilated. - Technically adequate study.  11/2017 30 day event monitor  30 day event monitor  Min HR 44, Max HR 174, Avg HR 75  Telemetry tracings show sinus rhythm, with episodes of afib with RVR up to 180  No symptoms reported    Labs/Other Tests and Data Reviewed:    EKG:  An ECG dated September 25, 2018 was personally reviewed today and demonstrated:  Sinus rhythm rate of 60, nonspecific intraventricular conduction delay.  Recent Labs: 09/22/2018: Hemoglobin 15.5; Platelets 238 07/09/2019: ALT 11; BUN 19; Creat 1.56; Potassium 4.3; Sodium 141   Recent Lipid Panel Lab Results  Component Value Date/Time   CHOL 175 07/04/2018 12:00 AM   TRIG 133 07/04/2018 12:00 AM  HDL 46 07/04/2018 12:00 AM   LDLCALC 106 07/04/2018 12:00 AM    Wt Readings from Last 3 Encounters:  09/04/19 194 lb (88 kg)  07/02/19 204 lb 6.4 oz (92.7 kg)  02/28/19 195 lb (88.5 kg)     Objective:    Vital Signs:  Ht 6' (1.829 m)   Wt 194 lb (88 kg)   BMI 26.31 kg/m     Patient has normal speech pattern and responding appropriately to questions.  No evidence of cough, wheezing, or shortness of breath. ASSESSMENT & PLAN:    Essential hypertension  PAF (paroxysmal atrial fibrillation) (HCC)  1. Essential hypertension Blood pressure within normal limits today.  Continue current antihypertensive therapy including; amlodipine 10 mg, benazepril 40 mg, metoprolol 25 mg 1/2 tablet by mouth twice a day.   2. PAF (paroxysmal atrial fibrillation) ([place]) He denies any recent significant arrhythmias, palpitations other than transient episodes lasting less than 30 seconds which are sporadic in nature.  Continue metoprolol 12.5 mg p.o. twice daily continue Eliquis 5 mg p.o. twice daily.   COVID-19 Education: The signs and symptoms of COVID-19 were discussed with the patient and how to seek care for testing (follow up with PCP or arrange E-visit).  The importance of social distancing was discussed today.  Time:   Today, I have spent 15 minutes with the patient with telehealth technology discussing the above problems.     Medication Adjustments/Labs and Tests Ordered: Current medicines are reviewed at length with the patient today.  Concerns regarding medicines are outlined above.   Tests Ordered: No orders of the defined types were placed in this encounter.   Medication Changes: No orders of the defined types were placed in this encounter.   Follow Up:  Either In Person or Virtual in 6 month(s)  Signed, Verta Ellen, NP  09/04/2019 2:29 PM    [place]

## 2019-09-06 ENCOUNTER — Other Ambulatory Visit: Payer: Self-pay | Admitting: Cardiology

## 2019-09-11 ENCOUNTER — Telehealth: Payer: Self-pay | Admitting: Family Medicine

## 2019-09-11 NOTE — Telephone Encounter (Signed)
Routing to Dr. Holly Bodily for Advice?

## 2019-09-11 NOTE — Telephone Encounter (Signed)
New pt exam would not be for wellness. Wellness exam dose not involve ANY diagnosis and treatment

## 2019-09-11 NOTE — Telephone Encounter (Signed)
Patient was seen in the office on 07/02/19 as a new patient and states he was told during that visit it was his wellness exam and patient is wanting to know can this be coded different so that he will get the benefits from his insurance company for having that this year.

## 2019-09-11 NOTE — Telephone Encounter (Signed)
Patient understands that he needs a Wellness Visit Scheduled

## 2019-10-02 DIAGNOSIS — N201 Calculus of ureter: Secondary | ICD-10-CM | POA: Diagnosis not present

## 2019-10-09 ENCOUNTER — Other Ambulatory Visit: Payer: Self-pay | Admitting: Cardiology

## 2019-10-09 DIAGNOSIS — H5203 Hypermetropia, bilateral: Secondary | ICD-10-CM | POA: Diagnosis not present

## 2019-10-09 DIAGNOSIS — H524 Presbyopia: Secondary | ICD-10-CM | POA: Diagnosis not present

## 2019-11-11 ENCOUNTER — Ambulatory Visit: Payer: Medicare Other | Attending: Internal Medicine

## 2019-11-11 DIAGNOSIS — Z23 Encounter for immunization: Secondary | ICD-10-CM | POA: Insufficient documentation

## 2019-11-11 NOTE — Progress Notes (Signed)
   Covid-19 Vaccination Clinic  Name:  Darius Smith    MRN: [id number] DOB: 1946-04-04  11/11/2019  Darius Smith was observed post Covid-19 immunization for 30 minutes based on pre-vaccination screening without incidence. He was provided with Vaccine Information Sheet and instruction to access the V-Safe system.   Darius Smith was instructed to call 911 with any severe reactions post vaccine: Marland Kitchen Difficulty breathing  . Swelling of your face and throat  . A fast heartbeat  . A bad rash all over your body  . Dizziness and weakness    Immunizations Administered    Name Date Dose VIS Date Route   Pfizer COVID-19 Vaccine 11/11/2019  3:05 PM 0.3 mL 08/31/2019 Intramuscular   Manufacturer: [place]   Lot: [id number]   [place]: [address]-6

## 2019-11-19 DIAGNOSIS — I129 Hypertensive chronic kidney disease with stage 1 through stage 4 chronic kidney disease, or unspecified chronic kidney disease: Secondary | ICD-10-CM | POA: Diagnosis not present

## 2019-11-19 DIAGNOSIS — N1832 Chronic kidney disease, stage 3b: Secondary | ICD-10-CM | POA: Diagnosis not present

## 2019-11-19 DIAGNOSIS — N200 Calculus of kidney: Secondary | ICD-10-CM | POA: Diagnosis not present

## 2019-12-05 ENCOUNTER — Ambulatory Visit: Payer: Medicare Other | Attending: Internal Medicine

## 2019-12-05 DIAGNOSIS — Z23 Encounter for immunization: Secondary | ICD-10-CM

## 2019-12-05 NOTE — Progress Notes (Signed)
   Covid-19 Vaccination Clinic  Name:  Darius Smith    MRN: [id number] DOB: October 18, 1945  12/05/2019  [name] was observed post Covid-19 immunization for 15 minutes without incident. He was provided with Vaccine Information Sheet and instruction to access the V-Safe system.   [name] was instructed to call 911 with any severe reactions post vaccine: Marland Kitchen Difficulty breathing  . Swelling of face and throat  . A fast heartbeat  . A bad rash all over body  . Dizziness and weakness   Immunizations Administered    Name Date Dose VIS Date Route   Pfizer COVID-19 Vaccine 12/05/2019 10:08 AM 0.3 mL 08/31/2019 Intramuscular   Manufacturer: [place]   Lot: [id number]   [place]: [id number]

## 2019-12-10 ENCOUNTER — Other Ambulatory Visit: Payer: Self-pay | Admitting: Cardiology

## 2019-12-12 DIAGNOSIS — Z0120 Encounter for dental examination and cleaning without abnormal findings: Secondary | ICD-10-CM | POA: Diagnosis not present

## 2020-01-01 ENCOUNTER — Other Ambulatory Visit: Payer: Self-pay

## 2020-01-01 ENCOUNTER — Ambulatory Visit (INDEPENDENT_AMBULATORY_CARE_PROVIDER_SITE_OTHER): Payer: Medicare Other | Admitting: Family Medicine

## 2020-01-01 ENCOUNTER — Encounter: Payer: Self-pay | Admitting: Family Medicine

## 2020-01-01 VITALS — BP 118/80 | HR 93 | Temp 97.6°F | Ht 72.0 in | Wt 202.2 lb

## 2020-01-01 DIAGNOSIS — J309 Allergic rhinitis, unspecified: Secondary | ICD-10-CM | POA: Diagnosis not present

## 2020-01-01 DIAGNOSIS — I10 Essential (primary) hypertension: Secondary | ICD-10-CM

## 2020-01-01 DIAGNOSIS — K219 Gastro-esophageal reflux disease without esophagitis: Secondary | ICD-10-CM

## 2020-01-01 DIAGNOSIS — R7309 Other abnormal glucose: Secondary | ICD-10-CM

## 2020-01-01 MED ORDER — OMEPRAZOLE 20 MG PO CPDR: 20.0000 mg | DELAYED_RELEASE_CAPSULE | Freq: Every day | ORAL | 2 refills | Status: DC | PRN

## 2020-01-01 MED ORDER — CETIRIZINE HCL 10 MG PO TABS: 10.0000 mg | ORAL_TABLET | Freq: Every day | ORAL | 2 refills | Status: DC | PRN

## 2020-01-01 MED ORDER — AMLODIPINE BESYLATE 10 MG PO TABS: 10.0000 mg | ORAL_TABLET | Freq: Every day | ORAL | 2 refills | Status: DC

## 2020-01-01 NOTE — Patient Instructions (Addendum)
Lipid panel and A1c    If you have lab work done today you will be contacted with your lab results within the next 2 weeks.  If you have not heard from [place] then please contact us. The fastest way to get your results is to register for My Chart.   IF you received an x-ray today, you will receive an invoice from [facility]iology. Please contact [facility] Radiology at [phone] with questions or concerns regarding your invoice.   IF you received labwork today, you will receive an invoice from [facility]. Please contact [facility] at [phone] with questions or concerns regarding your invoice.   Our billing staff will not be able to assist you with questions regarding bills from these companies.  You will be contacted with the lab results as soon as they are available. The fastest way to get your results is to activate your My Chart account. Instructions are located on the last page of this paperwork. If you have not heard from [place] regarding the results in 2 weeks, please contact this office.

## 2020-01-01 NOTE — Progress Notes (Signed)
Established Patient Office Visit  Subjective:  Patient ID: Darius Smith, male    DOB: 1946-04-16  Age: 74 y.o. MRN: [id number]  CC:  Chief Complaint  Patient presents with  . Annual Exam    here for his annual wellness    HPI Darius Smith presents for  Urology-PSA 2.8-evaluation normal Nephrology-ultrasound normal HTN-Norvasc/Lotensin/Lopressor-stable-renal function stable per nephrology GERD-omprazole 20mg  -daily Afib/CAD-Eliquis-cardiology following Elevated glucose-past blood work Past Medical History:  Diagnosis Date  . Arthritis    both hands  . Atrial fibrillation ([place]) 10/28/2017   Dr. Harl Bowie  . Blood in urine    occ  . Bronchitis   . Cancer (HCC)    Basal cell  . Cardiomegaly 10/28/2017  . Complication of anesthesia    Mr. Rickey developed a. fib in recovery after cysto procedure 10/2017 was transferred to [facility] and underwent successful cardioversion  . Diverticulosis of sigmoid colon 08/2017   Noted on colonoscopy  . Dysrhythmia   . External hemorrhoids 08/2017  . GERD (gastroesophageal reflux disease)   . History of colon polyps 08/2017  . History of kidney stones   . History of rectal bleeding   . History of right inguinal hernia   . Hypertension   . Shortness of breath     Past Surgical History:  Procedure Laterality Date  . CARDIOVERSION     10/2017  . CHOLECYSTECTOMY    . 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 CLEFT PALATE REPAIR     several from childhood til 74 years old  . COLONOSCOPY  04/28/2011   Procedure: COLONOSCOPY;  Surgeon: Rogene Houston, MD;  [facility];  Service: Endoscopy;  Laterality: N/A;  . COLONOSCOPY N/A 07/18/2014   Procedure: COLONOSCOPY;  Surgeon: Rogene Houston, MD;  [facility];  Service: Endoscopy;  Laterality: N/A;  830  . COLONOSCOPY N/A 09/09/2017   Procedure: COLONOSCOPY;  Surgeon: Rogene Houston, MD;  [facility];  Service: Endoscopy;  Laterality:  N/A;  955  . CYSTOSCOPY/URETEROSCOPY/HOLMIUM LASER/STENT PLACEMENT Bilateral 12/06/2017   Procedure: CYSTOSCOPY/RETROGRADE/URETEROSCOPY/HOLMIUM LASER/STENT EXCHANGE;  Surgeon: Festus Aloe, MD;  [facility];  Service: Urology;  Laterality: Bilateral;  ONLY NEEDS 60 MIN  . HERNIA REPAIR    . POLYPECTOMY  09/09/2017   Procedure: POLYPECTOMY;  Surgeon: Rogene Houston, MD;  [facility];  Service: Endoscopy;;  colon  . vocal cord surgery      Family History  Problem Relation Age of Onset  . Breast cancer Mother   . Prostate cancer Father   . COPD Sister     Social History   Socioeconomic History  . Marital status: Married    Spouse name: Not on file  . Number of children: Not on file  . Years of education: Not on file  . Highest education level: Not on file  Occupational History  . Not on file  Tobacco Use  . Smoking status: Never Smoker  . Smokeless tobacco: Never Used  Substance and Sexual Activity  . Alcohol use: Yes    Alcohol/week: 7.0 standard drinks    Types: 7 Cans of beer per week  . Drug use: No  . Sexual activity: Not on file  Other Topics Concern  . Not on file  Social History Narrative  . Not on file   Social Determinants of Health   Financial Resource Strain:   . Difficulty of Paying Living  Expenses:   Food Insecurity:   . Worried About Charity fundraiser in the Last Year:   . Arboriculturist in the Last Year:   Transportation Needs:   . Film/video editor (Medical):   Marland Kitchen Lack of Transportation (Non-Medical):   Physical Activity:   . Days of Exercise per Week:   . Minutes of Exercise per Session:   Stress:   . Feeling of Stress :   Social Connections:   . Frequency of Communication with Friends and Family:   . Frequency of Social Gatherings with Friends and Family:   . Attends Religious Services:   . Active Member of Clubs or Organizations:   . Attends Archivist Meetings:   Marland Kitchen Marital Status:   Intimate  Partner Violence:   . Fear of Current or Ex-Partner:   . Emotionally Abused:   Marland Kitchen Physically Abused:   . Sexually Abused:     Outpatient Medications Prior to Visit  Medication Sig Dispense Refill  . acetaminophen (TYLENOL) 500 MG tablet Take 1,000 mg by mouth every 6 (six) hours as needed for moderate pain or headache.    Marland Kitchen amLODipine (NORVASC) 10 MG tablet Take 10 mg by mouth daily.  0  . benazepril (LOTENSIN) 40 MG tablet Take 40 mg by mouth daily.  11  . cetirizine (ZYRTEC) 10 MG tablet Take 10 mg by mouth daily as needed for allergies.    Marland Kitchen ELIQUIS 5 MG TABS tablet TAKE ONE TABLET BY MOUTH 2 TIMES A DAY. 60 tablet 6  . metoprolol tartrate (LOPRESSOR) 25 MG tablet TAKE 1/2 TABLET BY MOUTH 2 TIMES A DAY. 90 tablet 1  . omeprazole (PRILOSEC) 20 MG capsule Take 20 mg by mouth daily as needed (for heartburn).     . VENTOLIN HFA 108 (90 Base) MCG/ACT inhaler Inhale 2 puffs into the lungs every 6 (six) hours as needed for wheezing or shortness of breath.   11   No facility-administered medications prior to visit.    Allergies  Allergen Reactions  . Augmentin [Amoxicillin-Pot Clavulanate] Other (See Comments)    Increased liver enzymes Has patient had a PCN reaction causing immediate rash, facial/tongue/throat swelling, SOB or lightheadedness with hypotension: No Has patient had a PCN reaction causing severe rash involving mucus membranes or skin necrosis: No Has patient had a PCN reaction that required hospitalization: No Has patient had a PCN reaction occurring within the last 10 years: No  If all of the above answers are "NO", then may proceed with Cephalosporin use.   Marland Kitchen Flomax [Tamsulosin Hcl] Nausea Only    Dizziness     ROS Review of Systems  Constitutional: Negative.   HENT: Negative.   Respiratory: Negative.   Cardiovascular:       Dr Marrion Coy  Gastrointestinal: Negative.   Endocrine: Negative.   Skin:       Derm -7/21  Allergic/Immunologic: Positive for  environmental allergies.  Psychiatric/Behavioral: Negative.       Objective:    Physical Exam  Constitutional: He is oriented to person, place, and time. He appears well-developed and well-nourished.  HENT:  Head: Normocephalic and atraumatic.  Cardiovascular: Normal rate, regular rhythm, normal heart sounds and intact distal pulses.  Pulmonary/Chest: Effort normal and breath sounds normal.  Musculoskeletal:        General: No edema. Normal range of motion.     Cervical back: Normal range of motion and neck supple.  Neurological: He is oriented to person, place, and time.  Psychiatric: He has a normal mood and affect. His behavior is normal.    BP 118/80 (BP Location: Right Arm, Patient Position: Sitting, Cuff Size: Normal)   Pulse 93   Temp 97.6 F (36.4 C) (Temporal)   Ht 6' (1.829 m)   Wt 202 lb 3.2 oz (91.7 kg)   SpO2 96%   BMI 27.42 kg/m  Wt Readings from Last 3 Encounters:  01/01/20 202 lb 3.2 oz (91.7 kg)  09/04/19 194 lb (88 kg)  07/02/19 204 lb 6.4 oz (92.7 kg)    Lab Results  Component Value Date   TSH 0.87 07/04/2018   Lab Results  Component Value Date   WBC 7.0 09/22/2018   HGB 15.5 09/22/2018   HCT 49.0 09/22/2018   MCV 90.6 09/22/2018   PLT 238 09/22/2018   Lab Results  Component Value Date   NA 141 07/09/2019   K 4.3 07/09/2019   CO2 28 07/09/2019   GLUCOSE 121 (H) 07/09/2019   BUN 19 07/09/2019   CREATININE 1.56 (H) 07/09/2019   BILITOT 0.8 07/09/2019   ALKPHOS 67 09/22/2018   AST 10 07/09/2019   ALT 11 07/09/2019   PROT 7.1 07/09/2019   ALBUMIN 4.0 09/22/2018   CALCIUM 9.0 07/09/2019   ANIONGAP 6 09/22/2018   Lab Results  Component Value Date   CHOL 175 07/04/2018   Lab Results  Component Value Date   HDL 46 07/04/2018   Lab Results  Component Value Date   LDLCALC 106 07/04/2018   Lab Results  Component Value Date   TRIG 133 07/04/2018     Assessment & Plan:   1. Allergic rhinitis, unspecified seasonality, unspecified  trigger Sinus rinse-OTC Ventolin MDI/zyrtec 2. Essential hypertension Norvasc/Lopressor-will request labwork /nephrology 3. Gastroesophageal reflux disease without esophagitis prilosec daily Follow-up: appt with [facility]  Ariam Mol Hannah Beat, MD

## 2020-01-30 ENCOUNTER — Other Ambulatory Visit: Payer: Self-pay

## 2020-01-30 DIAGNOSIS — I10 Essential (primary) hypertension: Secondary | ICD-10-CM

## 2020-01-30 MED ORDER — BENAZEPRIL HCL 40 MG PO TABS: 40.0000 mg | ORAL_TABLET | Freq: Every day | ORAL | 0 refills | Status: DC

## 2020-03-03 ENCOUNTER — Other Ambulatory Visit: Payer: Self-pay | Admitting: Cardiology

## 2020-03-06 ENCOUNTER — Ambulatory Visit: Payer: Medicare Other | Admitting: Cardiology

## 2020-03-06 ENCOUNTER — Encounter: Payer: Self-pay | Admitting: Cardiology

## 2020-03-06 VITALS — BP 130/88 | HR 102 | Ht 72.0 in | Wt 198.2 lb

## 2020-03-06 DIAGNOSIS — I10 Essential (primary) hypertension: Secondary | ICD-10-CM

## 2020-03-06 DIAGNOSIS — I480 Paroxysmal atrial fibrillation: Secondary | ICD-10-CM

## 2020-03-06 MED ORDER — METOPROLOL TARTRATE 25 MG PO TABS: 25.0000 mg | ORAL_TABLET | Freq: Two times a day (BID) | ORAL | 1 refills | Status: DC

## 2020-03-06 NOTE — Patient Instructions (Signed)
Your physician wants you to follow-up in: [name] will receive a reminder letter in the mail two months in advance. If you don't receive a letter, please call our office to schedule the follow-up appointment.  Your physician has recommended you make the following change in your medication:   INCREASE METOPROLOL 25 MG TWICE DAILY   Thank you for choosing [facility]!!

## 2020-03-06 NOTE — Progress Notes (Signed)
Clinical Summary Darius Smith is a 74 y.o.male seen today forfollow up of the following medical problems.  1.PAF - no recent palpitations - compliant with meds - no bleeding on eliquis  2. HTN -compliant withmeds    Completed COVID vaccine x 2.   Past Medical History:  Diagnosis Date  . Arthritis    both hands  . Atrial fibrillation ([place]) 10/28/2017   Dr. Harl Bowie  . Blood in urine    occ  . Bronchitis   . Cancer (HCC)    Basal cell  . Cardiomegaly 10/28/2017  . Complication of anesthesia    Darius Smith developed a. fib in recovery after cysto procedure 10/2017 was transferred to [facility] and underwent successful cardioversion  . Diverticulosis of sigmoid colon 08/2017   Noted on colonoscopy  . Dysrhythmia   . External hemorrhoids 08/2017  . GERD (gastroesophageal reflux disease)   . History of colon polyps 08/2017  . History of kidney stones   . History of rectal bleeding   . History of right inguinal hernia   . Hypertension   . Shortness of breath      Allergies  Allergen Reactions  . Augmentin [Amoxicillin-Pot Clavulanate] Other (See Comments)    Increased liver enzymes Has patient had a PCN reaction causing immediate rash, facial/tongue/throat swelling, SOB or lightheadedness with hypotension: No Has patient had a PCN reaction causing severe rash involving mucus membranes or skin necrosis: No Has patient had a PCN reaction that required hospitalization: No Has patient had a PCN reaction occurring within the last 10 years: No  If all of the above answers are "NO", then may proceed with Cephalosporin use.   Marland Kitchen Flomax [Tamsulosin Hcl] Nausea Only    Dizziness      Current Outpatient Medications  Medication Sig Dispense Refill  . acetaminophen (TYLENOL) 500 MG tablet Take 1,000 mg by mouth every 6 (six) hours as needed for moderate pain or headache.    Marland Kitchen amLODipine (NORVASC) 10 MG tablet Take 1 tablet (10 mg total) by mouth daily. 90 tablet 2  .  benazepril (LOTENSIN) 40 MG tablet Take 1 tablet (40 mg total) by mouth daily. 90 tablet 0  . cetirizine (ZYRTEC) 10 MG tablet Take 1 tablet (10 mg total) by mouth daily as needed for allergies. 90 tablet 2  . ELIQUIS 5 MG TABS tablet TAKE ONE TABLET BY MOUTH 2 TIMES A DAY. 60 tablet 6  . metoprolol tartrate (LOPRESSOR) 25 MG tablet TAKE 1/2 TABLET BY MOUTH 2 TIMES A DAY. 90 tablet 3  . omeprazole (PRILOSEC) 20 MG capsule Take 1 capsule (20 mg total) by mouth daily as needed (for heartburn). 90 capsule 2  . VENTOLIN HFA 108 (90 Base) MCG/ACT inhaler Inhale 2 puffs into the lungs every 6 (six) hours as needed for wheezing or shortness of breath.   11   No current facility-administered medications for this visit.     Past Surgical History:  Procedure Laterality Date  . CARDIOVERSION     10/2017  . CHOLECYSTECTOMY    . 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 CLEFT PALATE REPAIR     several from childhood til 74 years old  . COLONOSCOPY  04/28/2011   Procedure: COLONOSCOPY;  Surgeon: Rogene Houston, MD;  Location: [facility];  Service: Endoscopy;  Laterality: N/A;  . COLONOSCOPY N/A 07/18/2014   Procedure: COLONOSCOPY;  Surgeon: Rogene Houston, MD;  Location: [facility];  Service: Endoscopy;  Laterality: N/A;  830  . COLONOSCOPY N/A 09/09/2017   Procedure: COLONOSCOPY;  Surgeon: Rogene Houston, MD;  Location: [facility];  Service: Endoscopy;  Laterality: N/A;  955  . CYSTOSCOPY/URETEROSCOPY/HOLMIUM LASER/STENT PLACEMENT Bilateral 12/06/2017   Procedure: CYSTOSCOPY/RETROGRADE/URETEROSCOPY/HOLMIUM LASER/STENT EXCHANGE;  Surgeon: Festus Aloe, MD;  Location: [facility];  Service: Urology;  Laterality: Bilateral;  ONLY NEEDS 60 MIN  . HERNIA REPAIR    . POLYPECTOMY  09/09/2017   Procedure: POLYPECTOMY;  Surgeon: Rogene Houston, MD;  Location: [facility];  Service: Endoscopy;;  colon  . vocal cord surgery       Allergies  Allergen Reactions  .  Augmentin [Amoxicillin-Pot Clavulanate] Other (See Comments)    Increased liver enzymes Has patient had a PCN reaction causing immediate rash, facial/tongue/throat swelling, SOB or lightheadedness with hypotension: No Has patient had a PCN reaction causing severe rash involving mucus membranes or skin necrosis: No Has patient had a PCN reaction that required hospitalization: No Has patient had a PCN reaction occurring within the last 10 years: No  If all of the above answers are "NO", then may proceed with Cephalosporin use.   Marland Kitchen Flomax [Tamsulosin Hcl] Nausea Only    Dizziness       Family History  Problem Relation Age of Onset  . Breast cancer Mother   . Prostate cancer Father   . COPD Sister      Social History Darius Smith reports that he has never smoked. He has never used smokeless tobacco. Darius Smith reports current alcohol use of about 7.0 standard drinks of alcohol per week.   Review of Systems CONSTITUTIONAL: No weight loss, fever, chills, weakness or fatigue.  HEENT: Eyes: No visual loss, blurred vision, double vision or yellow sclerae.No hearing loss, sneezing, congestion, runny nose or sore throat.  SKIN: No rash or itching.  CARDIOVASCULAR: per hpi RESPIRATORY: No shortness of breath, cough or sputum.  GASTROINTESTINAL: No anorexia, nausea, vomiting or diarrhea. No abdominal pain or blood.  GENITOURINARY: No burning on urination, no polyuria NEUROLOGICAL: No headache, dizziness, syncope, paralysis, ataxia, numbness or tingling in the extremities. No change in bowel or bladder control.  MUSCULOSKELETAL: No muscle, back pain, joint pain or stiffness.  LYMPHATICS: No enlarged nodes. No history of splenectomy.  PSYCHIATRIC: No history of depression or anxiety.  ENDOCRINOLOGIC: No reports of sweating, cold or heat intolerance. No polyuria or polydipsia.  Marland Kitchen   Physical Examination Today's Vitals   03/06/20 1429  BP: 130/88  Pulse: (!) 102  SpO2: 98%  Weight: 198 lb  3.2 oz (89.9 kg)  Height: 6' (1.829 m)   Body mass index is 26.88 kg/m.  Gen: resting comfortably, no acute distress HEENT: no scleral icterus, pupils equal round and reactive, no palptable cervical adenopathy,  CV: irreg, tachy, no m/r/g, no vd Resp: Clear to auscultation bilaterally GI: abdomen is soft, non-tender, non-distended, normal bowel sounds, no hepatosplenomegaly MSK: extremities are warm, no edema.  Skin: warm, no rash Neuro:  no focal deficits Psych: appropriate affect   Diagnostic Studies 11/2017 echo Study Conclusions  - Left ventricle: The cavity size was normal. Wall thickness was increased in a pattern of mild LVH. Systolic function was normal. The estimated ejection fraction was in the range of 55% to 60%. Wall motion was normal; there were no regional wall motion abnormalities. Doppler parameters are consistent with abnormal left ventricular relaxation (grade 1 diastolic dysfunction). - Aortic valve: Mildly calcified annulus. Mildly thickened leaflets.  Mean gradient (S): 7 mm Hg. Valve area (VTI): 1.73 cm^2. - Mitral valve: Mildly calcified annulus. Mildly thickened leaflets . - Left atrium: The atrium was mildly to moderately dilated. - Technically adequate study.  11/2017 30 day event monitor  30 day event monitor  Min HR 44, Max HR 174, Avg HR 75  Telemetry tracings show sinus rhythm, with episodes of afib with RVR up to 180  No symptoms reported    Assessment and Plan  1.AFib - EKG today shows afib rate 119 - increase lopressor to 25mg  bid - continue eliquis  2. HTN -at goal, continue current meds   Request pcp labs      Arnoldo Lenis, M.D.

## 2020-03-20 ENCOUNTER — Ambulatory Visit (INDEPENDENT_AMBULATORY_CARE_PROVIDER_SITE_OTHER): Payer: Medicare Other | Admitting: Family Medicine

## 2020-03-20 ENCOUNTER — Encounter: Payer: Self-pay | Admitting: Family Medicine

## 2020-03-20 ENCOUNTER — Other Ambulatory Visit: Payer: Self-pay

## 2020-03-20 VITALS — BP 118/82 | HR 102 | Temp 97.4°F | Resp 18 | Ht 72.0 in | Wt 194.1 lb

## 2020-03-20 DIAGNOSIS — I4891 Unspecified atrial fibrillation: Secondary | ICD-10-CM | POA: Insufficient documentation

## 2020-03-20 DIAGNOSIS — I10 Essential (primary) hypertension: Secondary | ICD-10-CM

## 2020-03-20 DIAGNOSIS — K219 Gastro-esophageal reflux disease without esophagitis: Secondary | ICD-10-CM

## 2020-03-20 NOTE — Progress Notes (Signed)
Subjective:  Patient ID: Darius Smith, male    DOB: August 26, 1946  Age: 74 y.o. MRN: [id number]  CC:  Chief Complaint  Patient presents with  . New Patient (Initial Visit)    new pt former dr corum/hawkins pt does have afib seen dr branch on 03-05-20   . Nausea    2-3 weeks slight cough no fever has been nauseated slight headache cramps up his back and right side stomach pain he did get 3 ticks off about the first of june       HPI  HPI   Darius Smith is a 74 year old male patient who presents today to establish care. He is a very pleasant gentleman and he was previously a patient of Dr. Luan Pulling and was seen by Dr. Holly Bodily a few times. Additionally he sees cardiology for atrial fibrillation. He reports taking all medications without any issues or concerns. He has a history that includes but is not limited to atrial fibrillation, basal cell cancers, hypertension, GERD among others.  He reports that he is sleeping very well. Wakes up several times to go to the bathroom at night. But is able to fall back asleep. He does have a partial he sees a dentist regularly. He wears glasses he sees an eye doctor regularly. He denies having any changes in bowel or bladder habits. Denies having any blood in his urine or stool. Denies having any memory issues outside of some forgetfulness with names. He denies having any falls. He denies having any skin issues outside of being bitten by retakes this last month. He reports that they were on him for less than a day and they work overly engorged per him. He denied having any bull's-eye noted at the sites of the bite  Most recently he was sent to a kidney doctor secondary to having a kidney stones. He reports that he has discomfort on the sides. He reports they checked his urine and blood and overall everything looked like it was doing okay. He does not know when he goes back with this.  He reports that he is having some nausea. And some diarrhea and loose stool. He  reports he might have a GI bug he wasn't sure what was going on. He reports that his right side hurt. Sometimes feels like poker going to the right side. He reports he has headache occasionally. And that he cramps up some time in his back. He reports that he takes his Prilosec after he eats. Has not tried any Imodium or Pepto over-the-counter.  He is on Eliquis, so he wasn't sure about the antacids.  He denies having any chest pain, shortness of breath, leg swelling, dizziness.  He is up-to-date on all vaccines. Has had his Covid vaccines. Has had a recent colonoscopy in the last couple years.  Today patient denies signs and symptoms of COVID 19 infection including fever, chills, cough, shortness of breath, and headache. Past Medical, Surgical, Social History, Allergies, and Medications have been Reviewed.   Past Medical History:  Diagnosis Date  . Arthritis    both hands  . Atrial fibrillation ([place]) 10/28/2017   Dr. Harl Bowie  . Blood in urine    occ  . Bronchitis   . Cancer (HCC)    Basal cell  . Cardiomegaly 10/28/2017  . Complication of anesthesia    Darius. Kerschner developed a. fib in recovery after cysto procedure 10/2017 was transferred to [facility] and underwent successful cardioversion  . Diverticulosis of sigmoid colon  08/2017   Noted on colonoscopy  . Dysrhythmia   . External hemorrhoids 08/2017  . GERD (gastroesophageal reflux disease)   . History of colon polyps 08/2017  . History of kidney stones   . History of rectal bleeding   . History of right inguinal hernia   . Hypertension   . Nephrolithiasis 07/02/2019  . Rectal bleeding 09/02/2017   Added automatically from request for surgery [phone]  . Shortness of breath     Current Meds  Medication Sig  . acetaminophen (TYLENOL) 500 MG tablet Take 1,000 mg by mouth every 6 (six) hours as needed for moderate pain or headache.  Marland Kitchen amLODipine (NORVASC) 10 MG tablet Take 1 tablet (10 mg total) by mouth daily.  . benazepril  (LOTENSIN) 40 MG tablet Take 1 tablet (40 mg total) by mouth daily.  . cetirizine (ZYRTEC) 10 MG tablet Take 1 tablet (10 mg total) by mouth daily as needed for allergies.  Marland Kitchen ELIQUIS 5 MG TABS tablet TAKE ONE TABLET BY MOUTH 2 TIMES A DAY.  . metoprolol tartrate (LOPRESSOR) 25 MG tablet Take 1 tablet (25 mg total) by mouth 2 (two) times daily.  Marland Kitchen omeprazole (PRILOSEC) 20 MG capsule Take 1 capsule (20 mg total) by mouth daily as needed (for heartburn).  . VENTOLIN HFA 108 (90 Base) MCG/ACT inhaler Inhale 2 puffs into the lungs every 6 (six) hours as needed for wheezing or shortness of breath.     ROS:  Review of Systems  Constitutional: Negative.   HENT: Negative.   Eyes: Negative.   Respiratory: Negative.   Cardiovascular: Negative.   Gastrointestinal: Negative.   Genitourinary: Negative.   Musculoskeletal: Negative.   Skin: Negative.   Neurological: Negative.   Endo/Heme/Allergies: Negative.   Psychiatric/Behavioral: Negative.   All other systems reviewed and are negative.    Objective:   Today's Vitals: BP 118/82 (BP Location: Right Arm, Patient Position: Sitting, Cuff Size: Normal)   Pulse (!) 121   Temp (!) 97.4 F (36.3 C) (Temporal)   Resp 18   Ht 6' (1.829 m)   Wt 194 lb 1.9 oz (88.1 kg)   SpO2 95%   BMI 26.33 kg/m  Vitals with BMI 03/20/2020 03/06/2020 01/01/2020  Height 6\' 0"  6\' 0"  6\' 0"   Weight 194 lbs 2 oz 198 lbs 3 oz 202 lbs 3 oz  BMI 26.32 54.65 68.12  Systolic 751 700 174  Diastolic 82 88 80  Pulse 944 102 93     Physical Exam Vitals and nursing note reviewed.  Constitutional:      Appearance: Normal appearance. He is well-developed, well-groomed and overweight.  HENT:     Head: Normocephalic and atraumatic.     Right Ear: External ear normal.     Left Ear: External ear normal.     Mouth/Throat:     Comments: Mask in place  Eyes:     General:        Right eye: No discharge.        Left eye: No discharge.     Conjunctiva/sclera: Conjunctivae  normal.  Cardiovascular:     Rate and Rhythm: Normal rate. Rhythm irregular.     Pulses: Normal pulses.     Heart sounds: Normal heart sounds.  Pulmonary:     Effort: Pulmonary effort is normal.     Breath sounds: Normal breath sounds.  Musculoskeletal:        General: Normal range of motion.     Cervical back: Normal range of motion  and neck supple.  Skin:    General: Skin is warm.  Neurological:     General: No focal deficit present.     Mental Status: He is alert and oriented to person, place, and time.  Psychiatric:        Attention and Perception: Attention normal.        Mood and Affect: Mood normal.        Speech: Speech normal.        Behavior: Behavior normal. Behavior is cooperative.        Thought Content: Thought content normal.        Cognition and Memory: Cognition normal.        Judgment: Judgment normal.     Comments: Pleasant gentleman in conversation     Assessment   1. Atrial fibrillation, unspecified type ([place])   2. Essential hypertension   3. Gastroesophageal reflux disease without esophagitis     Tests ordered No orders of the defined types were placed in this encounter.    Plan: Please see assessment and plan per problem list above.   No orders of the defined types were placed in this encounter.   Patient to follow-up in November  Perlie Mayo, NP

## 2020-03-20 NOTE — Patient Instructions (Addendum)
I appreciate the opportunity to provide you with care for your health and wellness. Today we discussed: established care   Follow up: Nov for annual (same day as wife)   No labs or referrals today  Great to meet you today!  Please take your Prilosec 1 hour prior to eating. Please use Pepto and/or Tums if you're getting nauseated. You do not need to take these with other medications. Please let me know if your nausea, appetite improved and diarrhea do not resolve.  Please continue to practice social distancing to keep you, your family, and our community safe.  If you must go out, please wear a mask and practice good handwashing.  It was a pleasure to see you and I look forward to continuing to work together on your health and well-being. Please do not hesitate to call the office if you need care or have questions about your care.  Have a wonderful day and week. With Gratitude, Cherly Beach, DNP, AGNP-BC

## 2020-03-20 NOTE — Assessment & Plan Note (Signed)
Followed by Cardiology Continue all medications as ordered.

## 2020-03-20 NOTE — Assessment & Plan Note (Addendum)
Possibly uncontrolled reports some nausea and some bowel changes. Reports not taking his medication until after he eats. Provided with education on taking it before he eats. To see if this will help some of his GI symptoms.

## 2020-03-20 NOTE — Assessment & Plan Note (Signed)
Darius Smith is encouraged to maintain a well balanced diet that is low in salt. Controlled, continue current medication regimen.  Additionally, he is also reminded that exercise is beneficial for heart health and control of  Blood pressure. 30-60 minutes daily is recommended-walking was suggested.

## 2020-04-07 DIAGNOSIS — L821 Other seborrheic keratosis: Secondary | ICD-10-CM | POA: Diagnosis not present

## 2020-04-07 DIAGNOSIS — D239 Other benign neoplasm of skin, unspecified: Secondary | ICD-10-CM | POA: Diagnosis not present

## 2020-04-07 DIAGNOSIS — L570 Actinic keratosis: Secondary | ICD-10-CM | POA: Diagnosis not present

## 2020-04-09 ENCOUNTER — Other Ambulatory Visit: Payer: Self-pay | Admitting: *Deleted

## 2020-04-09 ENCOUNTER — Encounter: Payer: Self-pay | Admitting: Family Medicine

## 2020-04-09 ENCOUNTER — Other Ambulatory Visit (HOSPITAL_COMMUNITY)
Admission: AD | Admit: 2020-04-09 | Discharge: 2020-04-09 | Disposition: A | Discharge status: Home / Self Care | Payer: Medicare Other | Source: Skilled Nursing Facility | Attending: *Deleted | Admitting: *Deleted

## 2020-04-09 ENCOUNTER — Ambulatory Visit (INDEPENDENT_AMBULATORY_CARE_PROVIDER_SITE_OTHER): Payer: Medicare Other | Admitting: Family Medicine

## 2020-04-09 ENCOUNTER — Other Ambulatory Visit: Payer: Self-pay

## 2020-04-09 ENCOUNTER — Ambulatory Visit (HOSPITAL_COMMUNITY)
Admission: RE | Admit: 2020-04-09 | Discharge: 2020-04-09 | Disposition: A | Discharge status: Home / Self Care | Payer: Medicare Other | Source: Ambulatory Visit | Attending: Family Medicine | Admitting: Family Medicine

## 2020-04-09 VITALS — BP 119/84 | HR 121 | Temp 97.4°F | Resp 18 | Ht 72.0 in | Wt 195.8 lb

## 2020-04-09 DIAGNOSIS — R319 Hematuria, unspecified: Secondary | ICD-10-CM | POA: Diagnosis not present

## 2020-04-09 DIAGNOSIS — R109 Unspecified abdominal pain: Secondary | ICD-10-CM | POA: Insufficient documentation

## 2020-04-09 DIAGNOSIS — I4891 Unspecified atrial fibrillation: Secondary | ICD-10-CM | POA: Diagnosis not present

## 2020-04-09 DIAGNOSIS — I878 Other specified disorders of veins: Secondary | ICD-10-CM | POA: Diagnosis not present

## 2020-04-09 DIAGNOSIS — R10A1 Flank pain, right side: Secondary | ICD-10-CM | POA: Insufficient documentation

## 2020-04-09 DIAGNOSIS — N200 Calculus of kidney: Secondary | ICD-10-CM | POA: Diagnosis not present

## 2020-04-09 DIAGNOSIS — Z9049 Acquired absence of other specified parts of digestive tract: Secondary | ICD-10-CM | POA: Diagnosis not present

## 2020-04-09 LAB — POCT URINALYSIS DIP (CLINITEK)
Glucose, UA: NEGATIVE mg/dL
Leukocytes, UA: NEGATIVE
Nitrite, UA: NEGATIVE
POC PROTEIN,UA: 100 — AB
Spec Grav, UA: 1.025 (ref 1.010–1.025)
Urobilinogen, UA: 2 E.U./dL — AB
pH, UA: 5.5 (ref 5.0–8.0)

## 2020-04-09 NOTE — Patient Instructions (Signed)
I appreciate the opportunity to provide you with care for your health and wellness. Today we discussed: flank pain/questionable kidney stone   Follow up:  2 weeks   Labs today at [facility] No referrals today Orders: Xray at [facility]  Urine was positive for blood, this can mean a kidney stone sometimes. Given your symptoms we will check labs and xray. Pending those we will decided what would be best for treatment. We will send out the urine as well to make sure bacteria is not present.   Please continue to practice social distancing to keep you, your family, and our community safe.  If you must go out, please wear a mask and practice good handwashing.  It was a pleasure to see you and I look forward to continuing to work together on your health and well-being. Please do not hesitate to call the office if you need care or have questions about your care.  Have a wonderful day and week. With Gratitude, Cherly Beach, DNP, AGNP-BC

## 2020-04-09 NOTE — Assessment & Plan Note (Signed)
Noted A fib during exam today. Followed by Cardiology. No S&S or uncontrolled, reports taking medication as directed.

## 2020-04-09 NOTE — Progress Notes (Signed)
Subjective:  Patient ID: Darius Smith, male    DOB: 01/13/1946  Age: 74 y.o. MRN: [id number]  CC:  Chief Complaint  Patient presents with  . Follow-up    has been having lower right back pain for awhile even before establishing care here. Thought it was a stomach bug but this has not gotten any better. Laying down in recliner makes it better but when he gets up it goes back to hurting pain is at an 8 today       HPI  HPI  Darius Smith is a 74 year old male patient of mine.  He presents today with lower right, flank, back pain.  Reports that he it started around April.  He has had some nausea on and off.  And was taking omeprazole to help with that.  He had been seen by the kidney doctor was noted to not have any infection at that time but this was about 2 weeks ago.  Was also noted that he did blood work.  However he reports that the discomfort has increased.  He has a history of having a kidney stone of which she did know that he had the stone until he was x-rayed.  He has a history of having diverticulosis.  He reports no falls or injuries or trauma.  He has had a hernia surgery with mesh placement.  Does not report any discomfort that was similar to that.  Has not done any excessive lifting or twisting.  Reports that his pain is an 8 out of 10 when it is bothering him.  When he is laying flat it is most bothersome and when he is trying to get up and down. He went out of town for couple weeks on vacation.  After seeing the kidney doctor reports that he is gotten worse that he presents today to figure out what might be going on.  He denies having any changes in bowel bladder habits.  Denies seeing any blood in urine or stool.  Today patient denies signs and symptoms of COVID 19 infection including fever, chills, cough, shortness of breath, and headache. Past Medical, Surgical, Social History, Allergies, and Medications have been Reviewed.   Past Medical History:  Diagnosis Date  .  Arthritis    both hands  . Atrial fibrillation ([place]) 10/28/2017   Dr. Harl Bowie  . Blood in urine    occ  . Bronchitis   . Cancer (HCC)    Basal cell  . Cardiomegaly 10/28/2017  . Complication of anesthesia    Darius. Elie developed a. fib in recovery after cysto procedure 10/2017 was transferred to [facility] and underwent successful cardioversion  . Diverticulosis of sigmoid colon 08/2017   Noted on colonoscopy  . Dysrhythmia   . External hemorrhoids 08/2017  . GERD (gastroesophageal reflux disease)   . History of colon polyps 08/2017  . History of kidney stones   . History of rectal bleeding   . History of right inguinal hernia   . Hypertension   . Nephrolithiasis 07/02/2019  . Rectal bleeding 09/02/2017   Added automatically from request for surgery [phone]  . Shortness of breath     Current Meds  Medication Sig  . acetaminophen (TYLENOL) 500 MG tablet Take 1,000 mg by mouth every 6 (six) hours as needed for moderate pain or headache.  Marland Kitchen amLODipine (NORVASC) 10 MG tablet Take 1 tablet (10 mg total) by mouth daily.  . benazepril (LOTENSIN) 40 MG tablet Take 1  tablet (40 mg total) by mouth daily.  . cetirizine (ZYRTEC) 10 MG tablet Take 1 tablet (10 mg total) by mouth daily as needed for allergies.  Marland Kitchen ELIQUIS 5 MG TABS tablet TAKE ONE TABLET BY MOUTH 2 TIMES A DAY.  . metoprolol tartrate (LOPRESSOR) 25 MG tablet Take 1 tablet (25 mg total) by mouth 2 (two) times daily.  Marland Kitchen omeprazole (PRILOSEC) 20 MG capsule Take 1 capsule (20 mg total) by mouth daily as needed (for heartburn).  . VENTOLIN HFA 108 (90 Base) MCG/ACT inhaler Inhale 2 puffs into the lungs every 6 (six) hours as needed for wheezing or shortness of breath.     ROS:  Review of Systems  Constitutional: Negative.   HENT: Negative.   Eyes: Negative.   Respiratory: Negative.   Cardiovascular: Negative.   Gastrointestinal: Positive for abdominal pain, heartburn and nausea.  Genitourinary: Positive for flank pain.   Musculoskeletal: Positive for back pain.  Skin: Negative.   Neurological: Negative.   Endo/Heme/Allergies: Negative.   Psychiatric/Behavioral: Negative.   All other systems reviewed and are negative.    Objective:   Today's Vitals: BP 119/84 (BP Location: Right Arm, Patient Position: Sitting, Cuff Size: Normal)   Pulse (!) 121   Temp (!) 97.4 F (36.3 C) (Temporal)   Resp 18   Ht 6' (1.829 m)   Wt 195 lb 12.8 oz (88.8 kg)   SpO2 98%   BMI 26.56 kg/m  Vitals with BMI 04/09/2020 04/09/2020 03/20/2020  Height - 6\' 0"  6\' 0"   Weight - 195 lbs 13 oz 194 lbs 2 oz  BMI - 63.14 97.02  Systolic - 637 858  Diastolic - 84 82  Pulse 850 134 102     Physical Exam Vitals and nursing note reviewed.  Constitutional:      Appearance: Normal appearance. He is well-developed and well-groomed. He is obese.  HENT:     Head: Normocephalic and atraumatic.     Right Ear: External ear normal.     Left Ear: External ear normal.     Mouth/Throat:     Comments: Mask in place  Eyes:     General:        Right eye: No discharge.        Left eye: No discharge.     Conjunctiva/sclera: Conjunctivae normal.  Cardiovascular:     Rate and Rhythm: Tachycardia present. Rhythm irregular.     Pulses: Normal pulses.     Heart sounds: Normal heart sounds.  Pulmonary:     Effort: Pulmonary effort is normal.     Breath sounds: Normal breath sounds.  Abdominal:     General: Abdomen is flat. Bowel sounds are increased.     Palpations: Abdomen is soft. There is no hepatomegaly or splenomegaly.     Tenderness: There is abdominal tenderness in the right lower quadrant. There is no right CVA tenderness, left CVA tenderness, guarding or rebound. Negative signs include Murphy's sign, Rovsing's sign, McBurney's sign, psoas sign and obturator sign.     Hernia: A hernia is present. Hernia is present in the umbilical area.  Musculoskeletal:        General: Normal range of motion.     Cervical back: Normal range of  motion and neck supple.  Skin:    General: Skin is warm.  Neurological:     General: No focal deficit present.     Mental Status: He is alert and oriented to person, place, and time.  Psychiatric:  Attention and Perception: Attention normal.        Mood and Affect: Mood normal.        Speech: Speech normal.        Behavior: Behavior normal. Behavior is cooperative.        Thought Content: Thought content normal.        Cognition and Memory: Cognition normal.        Judgment: Judgment normal.     Assessment   1. Right flank pain   2. Hematuria, unspecified type   3. Atrial fibrillation, unspecified type ([facility])     Tests ordered Orders Placed This Encounter  Procedures  . DG Abd 1 View  . CBC with Differential/Platelet  . Comprehensive metabolic panel  . POCT URINALYSIS DIP (CLINITEK)     Plan: Please see assessment and plan per problem list above.   No orders of the defined types were placed in this encounter.   Patient to follow-up in 2 weeks  Perlie Mayo, NP

## 2020-04-09 NOTE — Assessment & Plan Note (Signed)
Xray and labs to rule out stone with given S&S. Will treat based off findings.

## 2020-04-09 NOTE — Assessment & Plan Note (Signed)
Xray and Labs given blood in urine. Will treat based off results

## 2020-04-10 ENCOUNTER — Telehealth: Payer: Self-pay

## 2020-04-10 LAB — COMPREHENSIVE METABOLIC PANEL
ALT: 10 IU/L (ref 0–44)
AST: 11 IU/L (ref 0–40)
Albumin/Globulin Ratio: 1.3 (ref 1.2–2.2)
Albumin: 4 g/dL (ref 3.7–4.7)
Alkaline Phosphatase: 95 IU/L (ref 48–121)
BUN/Creatinine Ratio: 11 (ref 10–24)
BUN: 16 mg/dL (ref 8–27)
Bilirubin Total: 0.7 mg/dL (ref 0.0–1.2)
CO2: 21 mmol/L (ref 20–29)
Calcium: 9.3 mg/dL (ref 8.6–10.2)
Chloride: 104 mmol/L (ref 96–106)
Creatinine, Ser: 1.47 mg/dL — ABNORMAL HIGH (ref 0.76–1.27)
GFR calc Af Amer: 54 mL/min/{1.73_m2} — ABNORMAL LOW (ref >59)
GFR calc non Af Amer: 46 mL/min/{1.73_m2} — ABNORMAL LOW (ref >59)
Globulin, Total: 3.1 g/dL (ref 1.5–4.5)
Glucose: 101 mg/dL — ABNORMAL HIGH (ref 65–99)
Potassium: 4.3 mmol/L (ref 3.5–5.2)
Sodium: 140 mmol/L (ref 134–144)
Total Protein: 7.1 g/dL (ref 6.0–8.5)

## 2020-04-10 LAB — CBC WITH DIFFERENTIAL/PLATELET
Basophils Absolute: 0.1 10*3/uL (ref 0.0–0.2)
Basos: 1 %
EOS (ABSOLUTE): 0.3 10*3/uL (ref 0.0–0.4)
Eos: 4 %
Hematocrit: 42.4 % (ref 37.5–51.0)
Hemoglobin: 13.5 g/dL (ref 13.0–17.7)
Immature Grans (Abs): 0 10*3/uL (ref 0.0–0.1)
Immature Granulocytes: 0 %
Lymphocytes Absolute: 1.6 10*3/uL (ref 0.7–3.1)
Lymphs: 17 %
MCH: 27.4 pg (ref 26.6–33.0)
MCHC: 31.8 g/dL (ref 31.5–35.7)
MCV: 86 fL (ref 79–97)
Monocytes Absolute: 0.9 10*3/uL (ref 0.1–0.9)
Monocytes: 10 %
Neutrophils Absolute: 6.1 10*3/uL (ref 1.4–7.0)
Neutrophils: 68 %
Platelets: 287 10*3/uL (ref 150–450)
RBC: 4.92 x10E6/uL (ref 4.14–5.80)
RDW: 13.2 % (ref 11.6–15.4)
WBC: 8.9 10*3/uL (ref 3.4–10.8)

## 2020-04-10 LAB — URINE CULTURE: Culture: <10000 — AB

## 2020-04-10 NOTE — Telephone Encounter (Signed)
Pt is going out would like you to call him at [phone] with the results

## 2020-04-10 NOTE — Telephone Encounter (Signed)
Advised pt of lab results with verbal understanding

## 2020-04-11 ENCOUNTER — Encounter: Payer: Self-pay | Admitting: Family Medicine

## 2020-04-11 ENCOUNTER — Ambulatory Visit: Payer: Medicare Other | Admitting: Family Medicine

## 2020-04-16 DIAGNOSIS — N200 Calculus of kidney: Secondary | ICD-10-CM | POA: Diagnosis not present

## 2020-04-16 DIAGNOSIS — R1084 Generalized abdominal pain: Secondary | ICD-10-CM | POA: Diagnosis not present

## 2020-04-21 ENCOUNTER — Other Ambulatory Visit: Payer: Self-pay | Admitting: Family Medicine

## 2020-04-21 ENCOUNTER — Other Ambulatory Visit: Payer: Self-pay | Admitting: Cardiology

## 2020-04-21 DIAGNOSIS — I10 Essential (primary) hypertension: Secondary | ICD-10-CM

## 2020-04-25 ENCOUNTER — Ambulatory Visit: Payer: Medicare Other | Admitting: Family Medicine

## 2020-04-25 DIAGNOSIS — K5730 Diverticulosis of large intestine without perforation or abscess without bleeding: Secondary | ICD-10-CM | POA: Diagnosis not present

## 2020-04-25 DIAGNOSIS — R3121 Asymptomatic microscopic hematuria: Secondary | ICD-10-CM | POA: Diagnosis not present

## 2020-04-25 DIAGNOSIS — K4090 Unilateral inguinal hernia, without obstruction or gangrene, not specified as recurrent: Secondary | ICD-10-CM | POA: Diagnosis not present

## 2020-04-25 DIAGNOSIS — N209 Urinary calculus, unspecified: Secondary | ICD-10-CM | POA: Diagnosis not present

## 2020-04-25 DIAGNOSIS — I700 Atherosclerosis of aorta: Secondary | ICD-10-CM | POA: Diagnosis not present

## 2020-04-25 DIAGNOSIS — K7689 Other specified diseases of liver: Secondary | ICD-10-CM | POA: Diagnosis not present

## 2020-04-29 ENCOUNTER — Encounter: Payer: Self-pay | Admitting: Family Medicine

## 2020-04-29 ENCOUNTER — Other Ambulatory Visit: Payer: Self-pay

## 2020-04-29 DIAGNOSIS — I10 Essential (primary) hypertension: Secondary | ICD-10-CM

## 2020-04-29 MED ORDER — BENAZEPRIL HCL 40 MG PO TABS: 40.0000 mg | ORAL_TABLET | Freq: Every day | ORAL | 0 refills | Status: DC

## 2020-05-06 ENCOUNTER — Encounter: Payer: Self-pay | Admitting: Urology

## 2020-05-06 ENCOUNTER — Other Ambulatory Visit: Payer: Self-pay

## 2020-05-06 ENCOUNTER — Ambulatory Visit (INDEPENDENT_AMBULATORY_CARE_PROVIDER_SITE_OTHER): Payer: Medicare Other | Admitting: Urology

## 2020-05-06 VITALS — BP 121/83 | HR 83 | Temp 98.0°F | Wt 194.0 lb

## 2020-05-06 DIAGNOSIS — N401 Enlarged prostate with lower urinary tract symptoms: Secondary | ICD-10-CM

## 2020-05-06 DIAGNOSIS — C7951 Secondary malignant neoplasm of bone: Secondary | ICD-10-CM

## 2020-05-06 DIAGNOSIS — R351 Nocturia: Secondary | ICD-10-CM | POA: Diagnosis not present

## 2020-05-06 LAB — MICROSCOPIC EXAMINATION
Bacteria, UA: NONE SEEN
Epithelial Cells (non renal): NONE SEEN /hpf (ref 0–10)
RBC, Urine: >30 /hpf — AB (ref 0–2)
Renal Epithel, UA: NONE SEEN /hpf

## 2020-05-06 LAB — URINALYSIS, ROUTINE W REFLEX MICROSCOPIC
Bilirubin, UA: NEGATIVE
Glucose, UA: NEGATIVE
Nitrite, UA: NEGATIVE
Specific Gravity, UA: 1.025 (ref 1.005–1.030)
Urobilinogen, Ur: 1 mg/dL (ref 0.2–1.0)
pH, UA: 5.5 (ref 5.0–7.5)

## 2020-05-06 MED ORDER — DIAZEPAM 5 MG PO TABS: 10.0000 mg | ORAL_TABLET | Freq: Once | ORAL | 0 refills | Status: AC

## 2020-05-06 NOTE — Patient Instructions (Signed)
Centralized Scheduling will contact you with your MRI appointment after your insurance gives [place] the approval. It may take 7-10 business days for scheduling to get you an appointment.   Dr. Diona Fanti will call you with your MRI results.

## 2020-05-06 NOTE — Progress Notes (Signed)
H&P  Chief Complaint: Flank Pain  History of Present Illness:   8.17.2021: Pt here for f/u after experiencing flank pain and KUB. Pt denies any flank pain or gross hematuria at this time. Pt submits that his LUTS have improved with tamsulosin.  CT scan from his visit in [place] revealed tiny pulmonary nodules, largest of which was 3 mm.  Located bilaterally.  It was recommended that he have a CT of the chest.  Additionally, CT revealed a mixed sclerotic/lytic lesion on T11.  There were no other bony abnormalities, but it was suggested that this may be a metastatic lesion.  MRI of the T-spine was recommended.  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  PSA 2.8, checked in late 2020.  PSA in 2018 was 2.2.  He has not had a prostate exam in our office.  (below copied from [facility] records):  Toan Mort is a 74 year-old male established patient who is here for renal calculi after a surgical intervention.  The stone was on the right side. He had Stent and Ureteroscopy for treatment of his renal calculi. Patient denies ESWL and PCNL. This procedure was done 12/06/2017. He did not pass a stone since the last office visit. He does not have a stent in place.   He does not have dysuria. He does not have urgency. He does not have frequency.   He had right ureteroscopy in 2019. The stone was quite impacted. Pt removed his stent. Ultrasound showed the right kidney appeared normal without obvious. No hydronephrosis.   I looked back at his CT scan. This was from December 2018. That showed significant hydronephrosis which again has resolved. There was a left renal cyst. He has nocturia x 3-4 with the stent and now down to 1-2. The stone was Ca Ox.   Nov. 2020: He returns due to a creatinine of 1.56 and bun of 19 on 07/09/2019. Dr. Holly Bodily was going to refer to a "kidney specialist". His UA is clear. His PSA was 2.8 on the same date. No flank pain or gross hematuria. Voiding well. Noc x 2.    04/16/2020: U/S performed after last office visit showed stable bilateral renal cysts, no obvious obstructive signs bilaterally. He had 2 small stones noted on the right kidney and 1 on the left. Voiding symptoms are stable at that time, urinalysis clear.   Beginning approximately 3 weeks ago the patient developed right lower back and flank pain/discomfort radiating into the abdomen. Typically worse when lying down and with position changes. Not associated with burning or painful urination, visible blood in the urine. He saw his PCP last week who noted some blood on dipstick urinalysis. A urine culture was sent showing less than 10,000 colonies of insignificant growth. His creatinine at that time was 1.47 which is slightly improved from prior assessments.   KUB was done which I independently reviewed. There was some small nonobstructing calculi in the lower pole and mid pole area of the left kidney. The right kidney was poorly visualized but no obvious nephrolithiasis was identified. There were no obvious ureteral calculi bilaterally as well. Today pain/discomfort have improved significantly by his report. He does mention over the past couple of weeks having worsening urinary frequency/urgency voiding every 2 hours and if he delays voiding he will leak. Not associated with any new or worsening ability to start his stream. Again no burning or painful urination, visible blood in the urine. Symptoms not associated with fevers or chills, nausea/vomiting. Patient  requesting to go back on tamsulosin. He previously was taking the medication at the time of his most recent ureteroscopy on the same side. He poorly tolerated the alpha-blocker therapy but states thinking symptoms were more related to side effects of having the stent and not having a true reaction to the alpha-blocker therapy.   8.6.2021: Pt reports that the pain in his flank (R) has resolved during the weekend (7.31.2021) and that he believes that he  may have passed his stone. Pt reports that tamsulosin has reduced his LUTS.     Past Medical History:  Diagnosis Date  . Arthritis    both hands  . Atrial fibrillation ([place]) 10/28/2017   Dr. Harl Bowie  . Blood in urine    occ  . Bronchitis   . Cancer (HCC)    Basal cell  . Cardiomegaly 10/28/2017  . Complication of anesthesia    Mr. Juday developed a. fib in recovery after cysto procedure 10/2017 was transferred to [facility] and underwent successful cardioversion  . Diverticulosis of sigmoid colon 08/2017   Noted on colonoscopy  . Dysrhythmia   . External hemorrhoids 08/2017  . GERD (gastroesophageal reflux disease)   . History of colon polyps 08/2017  . History of kidney stones   . History of rectal bleeding   . History of right inguinal hernia   . Hypertension   . Nephrolithiasis 07/02/2019  . Rectal bleeding 09/02/2017   Added automatically from request for surgery [phone]  . Shortness of breath     Past Surgical History:  Procedure Laterality Date  . CARDIOVERSION     10/2017  . CHOLECYSTECTOMY    . 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 CLEFT PALATE REPAIR     several from childhood til 74 years old  . COLONOSCOPY  04/28/2011   Procedure: COLONOSCOPY;  Surgeon: Rogene Houston, MD;  Location: [facility];  Service: Endoscopy;  Laterality: N/A;  . COLONOSCOPY N/A 07/18/2014   Procedure: COLONOSCOPY;  Surgeon: Rogene Houston, MD;  Location: [facility];  Service: Endoscopy;  Laterality: N/A;  830  . COLONOSCOPY N/A 09/09/2017   Procedure: COLONOSCOPY;  Surgeon: Rogene Houston, MD;  Location: [facility];  Service: Endoscopy;  Laterality: N/A;  955  . CYSTOSCOPY/URETEROSCOPY/HOLMIUM LASER/STENT PLACEMENT Bilateral 12/06/2017   Procedure: CYSTOSCOPY/RETROGRADE/URETEROSCOPY/HOLMIUM LASER/STENT EXCHANGE;  Surgeon: Festus Aloe, MD;  Location: [facility];  Service: Urology;  Laterality: Bilateral;  ONLY NEEDS 60 MIN  . HERNIA REPAIR    .  POLYPECTOMY  09/09/2017   Procedure: POLYPECTOMY;  Surgeon: Rogene Houston, MD;  Location: [facility];  Service: Endoscopy;;  colon  . vocal cord surgery      Home Medications:  Allergies as of 05/06/2020      Reactions   Augmentin [amoxicillin-pot Clavulanate] Other (See Comments)   Increased liver enzymes Has patient had a PCN reaction causing immediate rash, facial/tongue/throat swelling, SOB or lightheadedness with hypotension: No Has patient had a PCN reaction causing severe rash involving mucus membranes or skin necrosis: No Has patient had a PCN reaction that required hospitalization: No Has patient had a PCN reaction occurring within the last 10 years: No  If all of the above answers are "NO", then may proceed with Cephalosporin use.   Flomax [tamsulosin Hcl] Nausea Only   Dizziness       Medication List       Accurate as of May 06, 2020  9:17 AM.  If you have any questions, ask your nurse or doctor.        acetaminophen 500 MG tablet Commonly known as: TYLENOL Take 1,000 mg by mouth every 6 (six) hours as needed for moderate pain or headache.   amLODipine 10 MG tablet Commonly known as: NORVASC Take 1 tablet (10 mg total) by mouth daily.   benazepril 40 MG tablet Commonly known as: LOTENSIN Take 1 tablet (40 mg total) by mouth daily.   cetirizine 10 MG tablet Commonly known as: ZYRTEC Take 1 tablet (10 mg total) by mouth daily as needed for allergies.   Eliquis 5 MG Tabs tablet Generic drug: apixaban TAKE ONE TABLET BY MOUTH 2 TIMES A DAY.   metoprolol tartrate 25 MG tablet Commonly known as: LOPRESSOR Take 1 tablet (25 mg total) by mouth 2 (two) times daily.   omeprazole 20 MG capsule Commonly known as: PRILOSEC Take 1 capsule (20 mg total) by mouth daily as needed (for heartburn).   Ventolin HFA 108 (90 Base) MCG/ACT inhaler Generic drug: albuterol Inhale 2 puffs into the lungs every 6 (six) hours as needed for wheezing or shortness of  breath.       Allergies:  Allergies  Allergen Reactions  . Augmentin [Amoxicillin-Pot Clavulanate] Other (See Comments)    Increased liver enzymes Has patient had a PCN reaction causing immediate rash, facial/tongue/throat swelling, SOB or lightheadedness with hypotension: No Has patient had a PCN reaction causing severe rash involving mucus membranes or skin necrosis: No Has patient had a PCN reaction that required hospitalization: No Has patient had a PCN reaction occurring within the last 10 years: No  If all of the above answers are "NO", then may proceed with Cephalosporin use.   Marland Kitchen Flomax [Tamsulosin Hcl] Nausea Only    Dizziness     Family History  Problem Relation Age of Onset  . Breast cancer Mother   . Prostate cancer Father   . COPD Sister     Social History:  reports that he has never smoked. He has never used smokeless tobacco. He reports current alcohol use of about 7.0 standard drinks of alcohol per week. He reports that he does not use drugs.  ROS: A complete review of systems was performed.  All systems are negative except for pertinent findings as noted.  Physical Exam:  Vital signs in last 24 hours: There were no vitals taken for this visit. Constitutional:  Alert and oriented, No acute distress GI: Abdomen is soft, nontender, nondistended, no abdominal masses. No CVAT. Umbilical Hernia. No hernia. Genitourinary: Uncircumcised male phallus, testes are descended bilaterally and non-tender and without masses, scrotum is normal in appearance without lesions or masses, perineum is normal on inspection. Prostate: 80 grams Lymphatic: No lymphadenopathy Neurologic: Grossly intact, no focal deficits Psychiatric: Normal mood and affect  I have reviewed prior pt notes  I have reviewed notes from referring/previous physicians  I have reviewed urinalysis results  I have independently reviewed prior imaging  I have reviewed prior PSA results  Radiologic  Imaging  I personally reviewed CT images  Impression/Assessment:  1.  History of renal calculi, no current symptoms from this, he is not passing any on recent CT  2.  Sclerotic/lytic lesion in T11.  Further evaluation suggested  3.  Tiny pulmonary nodules located bilaterally.  Farther down the order of importance for imaging presently  4.  Hepatic lesion, also recommended by radiology to evaluate with MRI  Plan:  1. Pt sent for MRI of thoracic  spine (T11)  2. F/U with pt depending on MRI results.  3.  I did reassure the patient and his wife that I do not think that this process is from his prostate.  We will add further evaluation following his MRI.   CC: Dr. Jerelene Redden

## 2020-05-06 NOTE — Progress Notes (Signed)
Urological Symptom Review  Patient is experiencing the following symptoms: Get up at night to urinate   Review of Systems  Gastrointestinal (upper)  : Negative for upper GI symptoms  Gastrointestinal (lower) : Negative for lower GI symptoms  Constitutional : Negative for symptoms  Skin: Negative for skin symptoms  Eyes: Negative for eye symptoms  Ear/Nose/Throat : Sinus problems  Hematologic/Lymphatic: Negative for Hematologic/Lymphatic symptoms  Cardiovascular : Negative for cardiovascular symptoms  Respiratory : Shortness of breath  Endocrine: Negative for endocrine symptoms  Musculoskeletal: Negative for musculoskeletal symptoms  Neurological: Negative for neurological symptoms  Psychologic: Negative for psychiatric symptoms

## 2020-05-23 ENCOUNTER — Other Ambulatory Visit: Payer: Self-pay

## 2020-05-23 ENCOUNTER — Other Ambulatory Visit (HOSPITAL_COMMUNITY): Payer: Self-pay | Admitting: Urology

## 2020-05-23 ENCOUNTER — Ambulatory Visit (HOSPITAL_COMMUNITY)
Admission: RE | Admit: 2020-05-23 | Discharge: 2020-05-23 | Disposition: A | Discharge status: Home / Self Care | Payer: Medicare Other | Source: Ambulatory Visit | Attending: Urology | Admitting: Urology

## 2020-05-23 ENCOUNTER — Other Ambulatory Visit: Payer: Self-pay | Admitting: Urology

## 2020-05-23 DIAGNOSIS — S24119A Complete lesion at unspecified level of thoracic spinal cord, initial encounter: Secondary | ICD-10-CM

## 2020-05-23 DIAGNOSIS — C7951 Secondary malignant neoplasm of bone: Secondary | ICD-10-CM | POA: Diagnosis not present

## 2020-05-23 DIAGNOSIS — K7689 Other specified diseases of liver: Secondary | ICD-10-CM

## 2020-05-23 DIAGNOSIS — C4491 Basal cell carcinoma of skin, unspecified: Secondary | ICD-10-CM | POA: Diagnosis not present

## 2020-05-23 DIAGNOSIS — D1809 Hemangioma of other sites: Secondary | ICD-10-CM | POA: Diagnosis not present

## 2020-05-27 DIAGNOSIS — R918 Other nonspecific abnormal finding of lung field: Secondary | ICD-10-CM | POA: Diagnosis not present

## 2020-05-29 ENCOUNTER — Other Ambulatory Visit (HOSPITAL_COMMUNITY): Payer: Self-pay | Admitting: Urology

## 2020-05-29 DIAGNOSIS — C779 Secondary and unspecified malignant neoplasm of lymph node, unspecified: Secondary | ICD-10-CM

## 2020-06-03 ENCOUNTER — Telehealth: Payer: Self-pay | Admitting: Cardiology

## 2020-06-03 ENCOUNTER — Ambulatory Visit: Payer: Medicare Other | Admitting: Urology

## 2020-06-03 NOTE — Telephone Encounter (Signed)
I will forward to Dr.Branch

## 2020-06-03 NOTE — Telephone Encounter (Signed)
Per Dr.Branch: OK to hold eliquis as needed.    I was connected to [facility] radiology voicemail of [place] and left Dr.Branches message.

## 2020-06-03 NOTE — Telephone Encounter (Signed)
New message    Patient is to have Biopsy on 06/05/20 and they need patient to hold eliquis starting today , they need a call back asap . Philis Nettle said she sent Dr Harl Bowie message back on 05/29/20, please advise how to proceed ?

## 2020-06-05 ENCOUNTER — Encounter (HOSPITAL_COMMUNITY): Payer: Self-pay

## 2020-06-05 ENCOUNTER — Other Ambulatory Visit (HOSPITAL_COMMUNITY): Payer: Medicare Other

## 2020-06-05 ENCOUNTER — Other Ambulatory Visit: Payer: Self-pay

## 2020-06-05 ENCOUNTER — Ambulatory Visit (HOSPITAL_COMMUNITY)
Admission: RE | Admit: 2020-06-05 | Discharge: 2020-06-05 | Disposition: A | Discharge status: Home / Self Care | Payer: Medicare Other | Source: Ambulatory Visit | Attending: Urology | Admitting: Urology

## 2020-06-05 DIAGNOSIS — C8294 Follicular lymphoma, unspecified, lymph nodes of axilla and upper limb: Secondary | ICD-10-CM | POA: Diagnosis not present

## 2020-06-05 DIAGNOSIS — C7951 Secondary malignant neoplasm of bone: Secondary | ICD-10-CM | POA: Diagnosis not present

## 2020-06-05 DIAGNOSIS — C801 Malignant (primary) neoplasm, unspecified: Secondary | ICD-10-CM | POA: Diagnosis not present

## 2020-06-05 DIAGNOSIS — N281 Cyst of kidney, acquired: Secondary | ICD-10-CM | POA: Diagnosis not present

## 2020-06-05 DIAGNOSIS — N261 Atrophy of kidney (terminal): Secondary | ICD-10-CM | POA: Diagnosis not present

## 2020-06-05 DIAGNOSIS — C779 Secondary and unspecified malignant neoplasm of lymph node, unspecified: Secondary | ICD-10-CM

## 2020-06-05 DIAGNOSIS — K7689 Other specified diseases of liver: Secondary | ICD-10-CM

## 2020-06-05 DIAGNOSIS — R2232 Localized swelling, mass and lump, left upper limb: Secondary | ICD-10-CM | POA: Diagnosis not present

## 2020-06-05 MED ORDER — GADOBUTROL 1 MMOL/ML IV SOLN
10.0000 mL | Freq: Once | INTRAVENOUS | Status: AC | PRN
  Administered 2020-06-05: 10 mL via INTRAVENOUS

## 2020-06-05 MED ORDER — LIDOCAINE HCL (PF) 2 % IJ SOLN
INTRAMUSCULAR | Status: AC
  Filled 2020-06-05: qty 10

## 2020-06-05 NOTE — Procedures (Signed)
PreOperative Dx: LT axillary mass/LN Postoperative Dx: LT axillary mass/LN Procedure:   [place] core biopsy of LT axillary mass/LN Radiologist:  Thornton Papas Anesthesia:  4 ml of 2% lidocaine Specimen:  (2) 18g core in formalin, (2) 18g core in saline EBL:   <4CQ Complications: None

## 2020-06-09 LAB — SURGICAL PATHOLOGY

## 2020-06-10 ENCOUNTER — Telehealth: Payer: Self-pay | Admitting: *Deleted

## 2020-06-10 ENCOUNTER — Ambulatory Visit (INDEPENDENT_AMBULATORY_CARE_PROVIDER_SITE_OTHER): Payer: Medicare Other | Admitting: Urology

## 2020-06-10 ENCOUNTER — Telehealth: Payer: Self-pay | Admitting: Family Medicine

## 2020-06-10 ENCOUNTER — Encounter: Payer: Self-pay | Admitting: Urology

## 2020-06-10 ENCOUNTER — Other Ambulatory Visit: Payer: Self-pay

## 2020-06-10 VITALS — BP 114/80 | HR 118 | Temp 97.8°F | Ht 72.0 in | Wt 194.0 lb

## 2020-06-10 DIAGNOSIS — I513 Intracardiac thrombosis, not elsewhere classified: Secondary | ICD-10-CM

## 2020-06-10 DIAGNOSIS — R351 Nocturia: Secondary | ICD-10-CM | POA: Diagnosis not present

## 2020-06-10 DIAGNOSIS — N401 Enlarged prostate with lower urinary tract symptoms: Secondary | ICD-10-CM

## 2020-06-10 DIAGNOSIS — C8594 Non-Hodgkin lymphoma, unspecified, lymph nodes of axilla and upper limb: Secondary | ICD-10-CM | POA: Diagnosis not present

## 2020-06-10 LAB — URINALYSIS, ROUTINE W REFLEX MICROSCOPIC
Bilirubin, UA: NEGATIVE
Glucose, UA: NEGATIVE
Ketones, UA: NEGATIVE
Leukocytes,UA: NEGATIVE
Nitrite, UA: NEGATIVE
RBC, UA: NEGATIVE
Specific Gravity, UA: 1.025 (ref 1.005–1.030)
Urobilinogen, Ur: 1 mg/dL (ref 0.2–1.0)
pH, UA: 5.5 (ref 5.0–7.5)

## 2020-06-10 LAB — MICROSCOPIC EXAMINATION
Bacteria, UA: NONE SEEN
Epithelial Cells (non renal): NONE SEEN /hpf (ref 0–10)
RBC, Urine: NONE SEEN /hpf (ref 0–2)
Renal Epithel, UA: NONE SEEN /hpf
WBC, UA: NONE SEEN /hpf (ref 0–5)

## 2020-06-10 NOTE — Progress Notes (Signed)
Urological Symptom Review  Patient is experiencing the following symptoms: Get up at night to urinate Blood in urine Erection problems (male only)  Kidney stones   Review of Systems  Gastrointestinal (upper)  : Negative for upper GI symptoms  Gastrointestinal (lower) : Negative for lower GI symptoms  Constitutional : Negative for symptoms  Skin: Negative for skin symptoms  Eyes: Negative for eye symptoms  Ear/Nose/Throat : Sinus problems  Hematologic/Lymphatic: Swollen glands  Cardiovascular : Negative for cardiovascular symptoms  Respiratory : Shortness of breath  Endocrine: Negative for endocrine symptoms  Musculoskeletal: Negative for musculoskeletal symptoms  Neurological: Negative for neurological symptoms  Psychologic: Negative for psychiatric symptoms

## 2020-06-10 NOTE — Telephone Encounter (Signed)
Pre-cert Verification for the following procedure    ECHO    DATE:  06/12/2020  LOCATION: [facility]

## 2020-06-10 NOTE — Telephone Encounter (Signed)
Pt and wife [name] voiced understanding - will have echo done at [facility] 9/23 @ 8:30am

## 2020-06-10 NOTE — Telephone Encounter (Signed)
----- Message from Arnoldo Lenis, MD sent at 06/10/2020 12:32 PM EDT ----- Regarding: FW: CT findings Can we order an echo for this patient, evaluate possible left atrial thrombus. Let patient know CT scan by urologist showed a possible abnormal area in the heart that could represent a small area of clot, needs echo to further evaluate.  Zandra Abts MD ----- Message ----- From: Franchot Gallo, MD Sent: 06/06/2020   1:19 PM EDT To: Arnoldo Lenis, MD Subject: RE: CT findings                                  If you can't find the films, here are the readings:   COMPARISON:  CT urogram from April 26, 2020     FINDINGS:  Cardiovascular: Marked cardiac enlargement, four-chamber enlargement  but with more pronounced enlargement of LEFT atrium and RIGHT heart.  Marked LEFT atrial enlargement associated with enlargement of LEFT  atrial appendage with low attenuation in the LEFT atrial appendage.     Calcified coronary artery disease. No pericardial effusion. The  central pulmonary vasculature is engorged approximately 3 cm.     Mediastinum/Nodes: Thoracic inlet structures are unremarkable.     Bulky LEFT axillary lymph node or mass measuring 5.2 x 5.0 cm  displaying low attenuation on this early venous phase assessment,  central density approximately 60 Hounsfield units however compatible  with soft tissue density.     Small nodules in the subcutaneous fat along the LEFT anterior chest  wall (image 59, series 2) approximately 1 cm small nodule.     Lungs/Pleura: Small pulmonary nodule at the lung base on the RIGHT  (image 93, series 3)     Small nodule at the LEFT lung base (image 102, series 3) numerous  other small nodules scattered throughout the lung bases  predominantly but with a RIGHT upper lobe nodule on image 46 of  series 3 measuring 9 x 5 mm. No consolidation or sign of pleural  effusion. Additional nodules in the LEFT upper lobe, largest  approximately 5 mm (image  33,     Upper Abdomen: Post cholecystectomy. Area of hypodensity in the  cephalad aspect of the LEFT hemi liver not as well seen as on the  previous noncontrast CT due to phase of contrast enhancement subtle  area seen on today's exam. No acute process in the upper abdomen.  Nodular appearing adrenals. Presumed LEFT renal cyst is partially  imaged.     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 Other areas of metastatic  disease better evaluated on recent spinal MRI     IMPRESSION:  1. Mass or adenopathy in the LEFT axilla suspicious for metastasis.  2. Marked cardiac and LEFT atrial enlargement with low attenuation  in the LEFT atrial appendage. Thrombus versus mixing artifact in  this area. Consider echocardiography for further evaluation.  3. Multiple small pulmonary nodules suspicious for metastatic  disease in this patient with signs of spinal metastases and and  nodal or soft tissue involvement in the LEFT axilla.  4. Area of hypodensity in the cephalad aspect of the LEFT hemi liver  not as well seen as on the previous noncontrast CT due to phase of  contrast enhancement however subtle area seen on today's exam.  Contrasted imaging of the abdomen and pelvis or PET evaluation may  be helpful for further assessment if  needed for staging.  5. Destructive lesion in T11 is unchanged. Other areas of metastatic  disease better evaluated on recent spinal MRI.  6. Calcified coronary artery disease.  7. Aortic atherosclerosis. ----- Message ----- From: Arnoldo Lenis, MD Sent: 06/05/2020   1:30 PM EDT To: Franchot Gallo, MD Subject: RE: CT findings                                Happy to help, I did not see a recent CT report in epic. I see an MRI you had ordered but don't see mention in the report the cardiac findngs. Was the CT done at outside facility, if so could your office scan into epic   Zandra Abts MD ----- Message  ----- From: Franchot Gallo, MD Sent: 05/28/2020   5:48 PM EDT To: Arnoldo Lenis, MD Subject: CT findings                                     I saw this man awhile back for possible kidney stone.  I got a CT that showed abnormality of T11 vertebral body.  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  Could you take a look for me?  We are scheduling eventual biopsy of his axillary mass and he will need to see an oncologist.  Thanks for your help.

## 2020-06-10 NOTE — Progress Notes (Signed)
H&P  Chief Complaint: Here for follow-up of metastatic cancer, with recent biopsy of left axillary mass  History of Present Illness: [name] underwent percutaneous biopsy of left axillary mass last week.  This returned as follicular carcinoma.   8.17.2021: Pt here for f/u after experiencing flank pain and KUB. Pt denies any flank pain or gross hematuria at this time. Pt submits that his LUTS have improved with tamsulosin.  CT scan from his visit in [place] revealed tiny pulmonary nodules, largest of which was 3 mm.  Located bilaterally.  It was recommended that he have a CT of the chest.  Additionally, CT revealed a mixed sclerotic/lytic lesion on T11.  There were no other bony abnormalities, but it was suggested that this may be a metastatic lesion.  MRI of the T-spine was recommended.  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  PSA 2.8, checked in late 2020.  PSA in 2018 was 2.2.  He has not had a prostate exam in our office.  (below copied from [facility] records):  Darius Smith is a 74 year-old male established patient who is here for renal calculi after a surgical intervention.  The stone was on the right side. He had Stent and Ureteroscopy for treatment of his renal calculi. Patient denies ESWL and PCNL. This procedure was done 12/06/2017. He did not pass a stone since the last office visit. He does not have a stent in place.   He does not have dysuria. He does not have urgency. He does not have frequency.   He had right ureteroscopy in 2019. The stone was quite impacted. Pt removed his stent. Ultrasound showed the right kidney appeared normal without obvious. No hydronephrosis.   I looked back at his CT scan. This was from December 2018. That showed significant hydronephrosis which again has resolved. There was a left renal cyst. He has nocturia x 3-4 with the stent and now down to 1-2. The stone was Ca Ox.   Nov. 2020: He returns due to a creatinine of 1.56 and  bun of 19 on 07/09/2019. Dr. Holly Bodily was going to refer to a "kidney specialist". His UA is clear. His PSA was 2.8 on the same date. No flank pain or gross hematuria. Voiding well. Noc x 2.   04/16/2020: U/S performed after last office visit showed stable bilateral renal cysts, no obvious obstructive signs bilaterally. He had 2 small stones noted on the right kidney and 1 on the left. Voiding symptoms are stable at that time, urinalysis clear.   Beginning approximately 3 weeks ago the patient developed right lower back and flank pain/discomfort radiating into the abdomen. Typically worse when lying down and with position changes. Not associated with burning or painful urination, visible blood in the urine. He saw his PCP last week who noted some blood on dipstick urinalysis. A urine culture was sent showing less than 10,000 colonies of insignificant growth. His creatinine at that time was 1.47 which is slightly improved from prior assessments.   KUB was done which I independently reviewed. There was some small nonobstructing calculi in the lower pole and mid pole area of the left kidney. The right kidney was poorly visualized but no obvious nephrolithiasis was identified. There were no obvious ureteral calculi bilaterally as well.    8.6.2021: Pt reports that the pain in his flank (R) has resolved during the weekend (7.31.2021) and that he believes that he may have passed his stone. Pt reports that tamsulosin has reduced his  LUTS.   Past Medical History:  Diagnosis Date  . Arthritis    both hands  . Atrial fibrillation ([place]) 10/28/2017   Dr. Harl Bowie  . Blood in urine    occ  . Bronchitis   . Cancer ([facility])    Basal cell  . Cardiomegaly 10/28/2017  . Complication of anesthesia    Mr. Beckstrand developed a. fib in recovery after cysto procedure 10/2017 was transferred to [facility] and underwent successful cardioversion  . Diverticulosis of sigmoid colon 08/2017   Noted on colonoscopy  . Dysrhythmia   .  External hemorrhoids 08/2017  . GERD (gastroesophageal reflux disease)   . History of colon polyps 08/2017  . History of kidney stones   . History of rectal bleeding   . History of right inguinal hernia   . Hypertension   . Nephrolithiasis 07/02/2019  . Rectal bleeding 09/02/2017   Added automatically from request for surgery [id number]  . Shortness of breath     Past Surgical History:  Procedure Laterality Date  . CARDIOVERSION     10/2017  . CHOLECYSTECTOMY    . 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 CLEFT PALATE REPAIR     several from childhood til 74 years old  . COLONOSCOPY  04/28/2011   Procedure: COLONOSCOPY;  Surgeon: Rogene Houston, MD;  Location: [facility];  Service: Endoscopy;  Laterality: N/A;  . COLONOSCOPY N/A 07/18/2014   Procedure: COLONOSCOPY;  Surgeon: Rogene Houston, MD;  Location: [facility];  Service: Endoscopy;  Laterality: N/A;  830  . COLONOSCOPY N/A 09/09/2017   Procedure: COLONOSCOPY;  Surgeon: Rogene Houston, MD;  Location: [facility];  Service: Endoscopy;  Laterality: N/A;  955  . CYSTOSCOPY/URETEROSCOPY/HOLMIUM LASER/STENT PLACEMENT Bilateral 12/06/2017   Procedure: CYSTOSCOPY/RETROGRADE/URETEROSCOPY/HOLMIUM LASER/STENT EXCHANGE;  Surgeon: Festus Aloe, MD;  Location: [facility];  Service: Urology;  Laterality: Bilateral;  ONLY NEEDS 60 MIN  . HERNIA REPAIR    . POLYPECTOMY  09/09/2017   Procedure: POLYPECTOMY;  Surgeon: Rogene Houston, MD;  Location[facility];  Service: Endoscopy;;  colon  . vocal cord surgery      Home Medications:  Allergies as of 06/10/2020      Reactions   Augmentin [amoxicillin-pot Clavulanate] Other (See Comments)   Increased liver enzymes Has patient had a PCN reaction causing immediate rash, facial/tongue/throat swelling, SOB or lightheadedness with hypotension: No Has patient had a PCN reaction causing severe rash involving mucus membranes or skin necrosis: No Has patient had a  PCN reaction that required hospitalization: No Has patient had a PCN reaction occurring within the last 10 years: No  If all of the above answers are "NO", then may proceed with Cephalosporin use.   Flomax [tamsulosin Hcl] Nausea Only   Dizziness       Medication List       Accurate as of June 10, 2020  8:48 AM. If you have any questions, ask your nurse or doctor.        acetaminophen 500 MG tablet Commonly known as: TYLENOL Take 1,000 mg by mouth every 6 (six) hours as needed for moderate pain or headache.   amLODipine 10 MG tablet Commonly known as: NORVASC Take 1 tablet (10 mg total) by mouth daily.   benazepril 40 MG tablet Commonly known as: LOTENSIN Take 1 tablet (40 mg total) by mouth daily.   cetirizine 10 MG tablet Commonly known as: ZYRTEC Take 1 tablet (  10 mg total) by mouth daily as needed for allergies.   Eliquis 5 MG Tabs tablet Generic drug: apixaban TAKE ONE TABLET BY MOUTH 2 TIMES A DAY.   metoprolol tartrate 25 MG tablet Commonly known as: LOPRESSOR Take 1 tablet (25 mg total) by mouth 2 (two) times daily.   omeprazole 20 MG capsule Commonly known as: PRILOSEC Take 1 capsule (20 mg total) by mouth daily as needed (for heartburn).   tamsulosin 0.4 MG Caps capsule Commonly known as: FLOMAX Take 0.4 mg by mouth at bedtime.   Ventolin HFA 108 (90 Base) MCG/ACT inhaler Generic drug: albuterol Inhale 2 puffs into the lungs every 6 (six) hours as needed for wheezing or shortness of breath.       Allergies:  Allergies  Allergen Reactions  . Augmentin [Amoxicillin-Pot Clavulanate] Other (See Comments)    Increased liver enzymes Has patient had a PCN reaction causing immediate rash, facial/tongue/throat swelling, SOB or lightheadedness with hypotension: No Has patient had a PCN reaction causing severe rash involving mucus membranes or skin necrosis: No Has patient had a PCN reaction that required hospitalization: No Has patient had a PCN  reaction occurring within the last 10 years: No  If all of the above answers are "NO", then may proceed with Cephalosporin use.   Marland Kitchen Flomax [Tamsulosin Hcl] Nausea Only    Dizziness     Family History  Problem Relation Age of Onset  . Breast cancer Mother   . Prostate cancer Father   . COPD Sister     Social History:  reports that he has never smoked. He has never used smokeless tobacco. He reports current alcohol use of about 7.0 standard drinks of alcohol per week. He reports that he does not use drugs.  ROS: A complete review of systems was performed.  All systems are negative except for pertinent findings as noted.  Physical Exam:  Vital signs in last 24 hours: BP 114/80   Pulse (!) 118   Temp 97.8 F (36.6 C)   Ht 6' (1.829 m)   Wt 194 lb (88 kg)   BMI 26.31 kg/m  Constitutional:  Alert and oriented, No acute distress Cardiovascular: Regular rate  Respiratory: Normal respiratory effort GI: Abdomen is soft, nontender, nondistended, no abdominal masses. No CVAT.  Genitourinary: Normal male phallus, testes are descended bilaterally and non-tender and without masses, scrotum is normal in appearance without lesions or masses, perineum is normal on inspection. Lymphatic: No lymphadenopathy Neurologic: Grossly intact, no focal deficits Psychiatric: Normal mood and affect   I have reviewed prior pt notes  I have reviewed notes from referring/previous physicians  I have reviewed urinalysis results  I have independently reviewed prior imaging, bx results  I have reviewed prior PSA results     Impression/Assessment:  Newly-diagnosed lymphoma  Plan:  I will work on setting up  Consultation w/ Dr Delton Coombes  I reviewed recent results w/ pt and wife, multiple questions answered

## 2020-06-11 ENCOUNTER — Encounter (HOSPITAL_COMMUNITY): Payer: Self-pay

## 2020-06-11 ENCOUNTER — Inpatient Hospital Stay (HOSPITAL_COMMUNITY): Payer: Medicare Other | Attending: Hematology | Admitting: Hematology

## 2020-06-11 ENCOUNTER — Inpatient Hospital Stay (HOSPITAL_COMMUNITY): Payer: Medicare Other

## 2020-06-11 VITALS — BP 118/80 | HR 84 | Temp 97.1°F | Resp 18 | Ht 72.0 in | Wt 202.1 lb

## 2020-06-11 DIAGNOSIS — I10 Essential (primary) hypertension: Secondary | ICD-10-CM | POA: Insufficient documentation

## 2020-06-11 DIAGNOSIS — R109 Unspecified abdominal pain: Secondary | ICD-10-CM | POA: Insufficient documentation

## 2020-06-11 DIAGNOSIS — C8590 Non-Hodgkin lymphoma, unspecified, unspecified site: Secondary | ICD-10-CM | POA: Insufficient documentation

## 2020-06-11 DIAGNOSIS — C8294 Follicular lymphoma, unspecified, lymph nodes of axilla and upper limb: Secondary | ICD-10-CM | POA: Diagnosis not present

## 2020-06-11 DIAGNOSIS — I4891 Unspecified atrial fibrillation: Secondary | ICD-10-CM | POA: Insufficient documentation

## 2020-06-11 DIAGNOSIS — R319 Hematuria, unspecified: Secondary | ICD-10-CM | POA: Insufficient documentation

## 2020-06-11 DIAGNOSIS — Z85828 Personal history of other malignant neoplasm of skin: Secondary | ICD-10-CM | POA: Diagnosis not present

## 2020-06-11 DIAGNOSIS — C8258 Diffuse follicle center lymphoma, lymph nodes of multiple sites: Secondary | ICD-10-CM

## 2020-06-11 DIAGNOSIS — C8298 Follicular lymphoma, unspecified, lymph nodes of multiple sites: Secondary | ICD-10-CM | POA: Diagnosis not present

## 2020-06-11 DIAGNOSIS — C8290 Follicular lymphoma, unspecified, unspecified site: Secondary | ICD-10-CM | POA: Insufficient documentation

## 2020-06-11 LAB — COMPREHENSIVE METABOLIC PANEL
ALT: 12 U/L (ref 0–44)
AST: 11 U/L — ABNORMAL LOW (ref 15–41)
Albumin: 3.7 g/dL (ref 3.5–5.0)
Alkaline Phosphatase: 65 U/L (ref 38–126)
Anion gap: 9 (ref 5–15)
BUN: 20 mg/dL (ref 8–23)
CO2: 23 mmol/L (ref 22–32)
Calcium: 8.8 mg/dL — ABNORMAL LOW (ref 8.9–10.3)
Chloride: 105 mmol/L (ref 98–111)
Creatinine, Ser: 1.44 mg/dL — ABNORMAL HIGH (ref 0.61–1.24)
GFR calc Af Amer: 55 mL/min — ABNORMAL LOW (ref >60)
GFR calc non Af Amer: 47 mL/min — ABNORMAL LOW (ref >60)
Glucose, Bld: 95 mg/dL (ref 70–99)
Potassium: 4.4 mmol/L (ref 3.5–5.1)
Sodium: 137 mmol/L (ref 135–145)
Total Bilirubin: 0.9 mg/dL (ref 0.3–1.2)
Total Protein: 7.3 g/dL (ref 6.5–8.1)

## 2020-06-11 LAB — HEPATITIS B SURFACE ANTIGEN: Hepatitis B Surface Ag: NONREACTIVE

## 2020-06-11 LAB — CBC WITH DIFFERENTIAL/PLATELET
Abs Immature Granulocytes: 0.01 10*3/uL (ref 0.00–0.07)
Basophils Absolute: 0.1 10*3/uL (ref 0.0–0.1)
Basophils Relative: 1 %
Eosinophils Absolute: 0.3 10*3/uL (ref 0.0–0.5)
Eosinophils Relative: 5 %
HCT: 41.9 % (ref 39.0–52.0)
Hemoglobin: 12.9 g/dL — ABNORMAL LOW (ref 13.0–17.0)
Immature Granulocytes: 0 %
Lymphocytes Relative: 21 %
Lymphs Abs: 1.5 10*3/uL (ref 0.7–4.0)
MCH: 27.7 pg (ref 26.0–34.0)
MCHC: 30.8 g/dL (ref 30.0–36.0)
MCV: 90.1 fL (ref 80.0–100.0)
Monocytes Absolute: 0.8 10*3/uL (ref 0.1–1.0)
Monocytes Relative: 11 %
Neutro Abs: 4.4 10*3/uL (ref 1.7–7.7)
Neutrophils Relative %: 62 %
Platelets: 244 10*3/uL (ref 150–400)
RBC: 4.65 MIL/uL (ref 4.22–5.81)
RDW: 15.1 % (ref 11.5–15.5)
WBC: 7.1 10*3/uL (ref 4.0–10.5)
nRBC: 0 % (ref 0.0–0.2)

## 2020-06-11 LAB — URIC ACID: Uric Acid, Serum: 7.2 mg/dL (ref 3.7–8.6)

## 2020-06-11 LAB — PSA: Prostatic Specific Antigen: 2.55 ng/mL (ref 0.00–4.00)

## 2020-06-11 LAB — HEPATITIS C ANTIBODY: HCV Ab: NONREACTIVE

## 2020-06-11 LAB — HEPATITIS B SURFACE ANTIBODY,QUALITATIVE: Hep B S Ab: NONREACTIVE

## 2020-06-11 LAB — LACTATE DEHYDROGENASE: LDH: 116 U/L (ref 98–192)

## 2020-06-11 NOTE — Patient Instructions (Signed)
[place] at [facility] Discharge Instructions  You were seen and examined today by Dr. Delton Coombes. Dr. Delton Coombes is a medical oncologist, meaning he specializes in the medical management of cancer. Dr. Delton Coombes discussed your past medical history, family history of cancer, and current symptoms such as weight loss or night sweats.   Dr. 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 This is a very slow growing Lymphoma. Your recent MRI did reveal an area of concern in at least one of the bones that make up your spine, Lymphoma does not often metastasize to the bone. Lymphoma is usually treated in the event of symptoms such as night sweats, fever and extreme fatigue. Treatment would also be prompted in the event of lab work abnormalities.  Dr. Delton Coombes has recommended you have a lab work done as well as a PET scan, which identifies cancer present in your body. The PET scan will be a total body picture, if the PET scan reveals that the area in your spine appears to be cancer, you may require another biopsy. The PET scan will also allow Dr. Delton Coombes to have a closer look at your lungs. The lab work will be done today. We will see you following the PET scan.   Thank you for choosing [place] at [facility] to provide your oncology and hematology care.  To afford each patient quality time with our provider, please arrive at least 15 minutes before your scheduled appointment time.   If you have a lab appointment with the [facility] please come in thru the Main Entrance and check in at the main information desk.  You need to re-schedule your appointment should you arrive 10 or more minutes late.  We strive to give you quality time with our providers, and arriving late affects you and other patients whose appointments are after yours.  Also, if you no show three or more times for  appointments you may be dismissed from the clinic at the providers discretion.     Again, thank you for choosing [facility].  Our hope is that these requests will decrease the amount of time that you wait before being seen by our physicians.       _____________________________________________________________  Should you have questions after your visit to [facility], please contact our office at [phone] and follow the prompts.  Our office hours are 8:00 a.m. and 4:30 p.m. Monday - Friday.  Please note that voicemails left after 4:00 p.m. may not be returned until the following business day.  We are closed weekends and major holidays.  You do have access to a nurse 24-7, just call the main number to the clinic [phone] and do not press any options, hold on the line and a nurse will answer the phone.    For prescription refill requests, have your pharmacy contact our office and allow 72 hours.    Due to Covid, you will need to wear a mask upon entering the hospital. If you do not have a mask, a mask will be given to you at the Main Entrance upon arrival. For doctor visits, patients may have 1 support person age 90 or older with them. For treatment visits, patients can not have anyone with them due to social distancing guidelines and our immunocompromised population.

## 2020-06-11 NOTE — Progress Notes (Signed)
I met with this patient as well as his wife today during initial visit with Dr. Delton Coombes. Patient reports no known barriers to care at this time, stating he has reliable transportation and a well-established support system in place. Patient has been arranged for labs and PET scan as recommended by Dr. Delton Coombes and will follow-up after the PET scan. I have provided my contact information to the patient and his wife and encouraged them to call with any questions or concerns that may arise.

## 2020-06-11 NOTE — Progress Notes (Signed)
[facility] 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 74 y.o. 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 Franchot Gallo from [facility].  Today he is accompanied by his wife, [name]. He denies having any back pain, though he did have some discomfort in his right lumbar area around March or April. He denies having any extreme fatigue, F/C, night sweats, or unintentional weight loss in the past 6 months. He denies having any extremity weakness or falls recently. He has occasional leg swelling but denies numbness or tingling. He denies ever being diagnosed with lymphoma before.  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 He is a non-smoker. He lives at home and he is able to do all of his ADL's. His father had prostate cancer; mother had breast cancer; paternal uncle had lung cancer; 2 paternal aunts with breast cancer.  He is coming to have an echo done on 9/23.  MEDICAL HISTORY:  Past Medical History:  Diagnosis Date  . Arthritis    both hands  . Atrial fibrillation ([place]) 10/28/2017   Dr. Harl Bowie  . Blood in urine    occ  . Bronchitis   . Cancer (HCC)    Basal cell  . Cardiomegaly 10/28/2017  . Complication of anesthesia    Mr. Saini developed a. fib in recovery after cysto procedure 10/2017 was transferred to [facility] and underwent successful cardioversion  . Diverticulosis of sigmoid colon 08/2017   Noted on colonoscopy  . Dysrhythmia   . External hemorrhoids 08/2017  . GERD (gastroesophageal reflux disease)   . History of colon polyps 08/2017  .  History of kidney stones   . History of rectal bleeding   . History of right inguinal hernia   . Hypertension   . Nephrolithiasis 07/02/2019  . Rectal bleeding 09/02/2017   Added automatically from request for surgery [id number]  . Shortness of breath     SURGICAL HISTORY: Past Surgical History:  Procedure Laterality Date  . CARDIOVERSION     10/2017  . CHOLECYSTECTOMY    . 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 CLEFT PALATE REPAIR     several from childhood til 74 years old  . COLONOSCOPY  04/28/2011   Procedure: COLONOSCOPY;  Surgeon: Rogene Houston, MD;  Location: [facility];  Service: Endoscopy;  Laterality: N/A;  . COLONOSCOPY N/A 07/18/2014   Procedure: COLONOSCOPY;  Surgeon: Rogene Houston, MD;  Location: [facility];  Service: Endoscopy;  Laterality: N/A;  830  . COLONOSCOPY N/A 09/09/2017   Procedure: COLONOSCOPY;  Surgeon: Rogene Houston, MD;  Location: [facility];  Service: Endoscopy;  Laterality: N/A;  955  . CYSTOSCOPY/URETEROSCOPY/HOLMIUM LASER/STENT PLACEMENT Bilateral 12/06/2017   Procedure: CYSTOSCOPY/RETROGRADE/URETEROSCOPY/HOLMIUM LASER/STENT EXCHANGE;  Surgeon: Festus Aloe, MD;  Location: [facility];  Service: Urology;  Laterality: Bilateral;  ONLY NEEDS 60 MIN  . HERNIA REPAIR    . POLYPECTOMY  09/09/2017   Procedure: POLYPECTOMY;  Surgeon: Rogene Houston, MD;  Location: [facility];  Service: Endoscopy;;  colon  .  vocal cord surgery      SOCIAL HISTORY: Social History   Socioeconomic History  . Marital status: Married    Spouse name: Not on file  . Number of children: Not on file  . Years of education: Not on file  . Highest education level: Not on file  Occupational History  . Occupation: retired  Tobacco Use  . Smoking status: Never Smoker  . Smokeless tobacco: Never Used  Vaping Use  . Vaping Use: Never used  Substance and Sexual Activity  . Alcohol use: Yes    Alcohol/week: 7.0 standard drinks    Types:  7 Cans of beer per week  . Drug use: No  . Sexual activity: Not on file  Other Topics Concern  . Not on file  Social History Narrative   Lives with wife [name] of 70 years April 15, 2020      2 children, 2 grandchildren   Dog: Gardner Candle      Enjoys: being outdoors       Diet: eats all food groups    Caffeine: drink coffee in the morning, some tea, diet dr pepper   Water: 3-4 cups daily         Wears seat belt    Does not use phone while driving   [place] put up not loaded          Social Determinants of Radio broadcast assistant Strain: Low Risk   . Difficulty of Paying Living Expenses: Not hard at all  Food Insecurity: No Food Insecurity  . Worried About Charity fundraiser in the Last Year: Never true  . Ran Out of Food in the Last Year: Never true  Transportation Needs: No Transportation Needs  . Lack of Transportation (Medical): No  . Lack of Transportation (Non-Medical): No  Physical Activity: Insufficiently Active  . Days of Exercise per Week: 3 days  . Minutes of Exercise per Session: 30 min  Stress: No Stress Concern Present  . Feeling of Stress : Only a little  Social Connections: Moderately Integrated  . Frequency of Communication with Friends and Family: More than three times a week  . Frequency of Social Gatherings with Friends and Family: More than three times a week  . Attends Religious Services: More than 4 times per year  . Active Member of Clubs or Organizations: No  . Attends Archivist Meetings: Never  . Marital Status: Married  Human resources officer Violence: Not At Risk  . Fear of Current or Ex-Partner: No  . Emotionally Abused: No  . Physically Abused: No  . Sexually Abused: No    FAMILY HISTORY: Family History  Problem Relation Age of Onset  . Breast cancer Mother   . Prostate cancer Father   . COPD Sister     ALLERGIES:  is allergic to augmentin [amoxicillin-pot clavulanate] and flomax [tamsulosin  hcl].  MEDICATIONS:  Current Outpatient Medications  Medication Sig Dispense Refill  . acetaminophen (TYLENOL) 500 MG tablet Take 1,000 mg by mouth every 6 (six) hours as needed for moderate pain or headache.    Marland Kitchen amLODipine (NORVASC) 10 MG tablet Take 1 tablet (10 mg total) by mouth daily. 90 tablet 2  . benazepril (LOTENSIN) 40 MG tablet Take 1 tablet (40 mg total) by mouth daily. 90 tablet 0  . cetirizine (ZYRTEC) 10 MG tablet Take 1 tablet (10 mg total) by mouth daily as needed for allergies. 90 tablet 2  .  ELIQUIS 5 MG TABS tablet TAKE ONE TABLET BY MOUTH 2 TIMES A DAY. 60 tablet 6  . metoprolol tartrate (LOPRESSOR) 25 MG tablet Take 1 tablet (25 mg total) by mouth 2 (two) times daily. 90 tablet 1  . omeprazole (PRILOSEC) 20 MG capsule Take 1 capsule (20 mg total) by mouth daily as needed (for heartburn). 90 capsule 2  . tamsulosin (FLOMAX) 0.4 MG CAPS capsule Take 0.4 mg by mouth at bedtime.    . VENTOLIN HFA 108 (90 Base) MCG/ACT inhaler Inhale 2 puffs into the lungs every 6 (six) hours as needed for wheezing or shortness of breath.   11   No current facility-administered medications for this visit.    REVIEW OF SYSTEMS:   Review of Systems  Constitutional: Positive for fatigue (mild). Negative for appetite change, chills, diaphoresis, fever and unexpected weight change.  Respiratory: Positive for shortness of breath.   Cardiovascular: Positive for leg swelling (occasional).  Musculoskeletal: Negative for back pain.  Neurological: Negative for extremity weakness and numbness.  All other systems reviewed and are negative.     PHYSICAL EXAMINATION: ECOG PERFORMANCE STATUS: 0 - Asymptomatic  Vitals:   06/11/20 1315  BP: 118/80  Pulse: 84  Resp: 18  Temp: (!) 97.1 F (36.2 C)  SpO2: 94%   Filed Weights   06/11/20 1315  Weight: 202 lb 1.6 oz (91.7 kg)   Physical Exam Vitals reviewed.  Constitutional:      Appearance: Normal appearance.  Cardiovascular:     Rate and  Rhythm: Normal rate and regular rhythm.     Pulses: Normal pulses.     Heart sounds: Normal heart sounds.  Pulmonary:     Effort: Pulmonary effort is normal.     Breath sounds: Normal breath sounds.  Abdominal:     Palpations: Abdomen is soft. There is no hepatomegaly, splenomegaly or mass.     Tenderness: There is no abdominal tenderness.     Hernia: No hernia is present.  Musculoskeletal:     Lumbar back: No tenderness or bony tenderness.     Right lower leg: Edema (trace) present.     Left lower leg: Edema (trace) present.  Lymphadenopathy:     Cervical: No cervical adenopathy.     Upper Body:     Right upper body: No supraclavicular, axillary or pectoral adenopathy.     Left upper body: Axillary adenopathy (6 x 3 cm freely mobile LN) present. No supraclavicular or pectoral adenopathy.     Lower Body: No right inguinal adenopathy. No left inguinal adenopathy.  Neurological:     General: No focal deficit present.     Mental Status: He is alert and oriented to person, place, and time.  Psychiatric:        Mood and Affect: Mood normal.        Behavior: Behavior normal.      LABORATORY DATA:  I have reviewed the data as listed CBC Latest Ref Rng & Units 04/09/2020 09/22/2018 07/04/2018  WBC 3.4 - 10.8 x10E3/uL 8.9 7.0 8.3  Hemoglobin 13.0 - 17.7 g/dL 13.5 15.5 -  Hematocrit 37.5 - 51.0 % 42.4 49.0 -  Platelets 150 - 450 x10E3/uL 287 238 -   CMP Latest Ref Rng & Units 04/09/2020 07/09/2019 09/22/2018  Glucose 65 - 99 mg/dL 101(H) 121(H) 104(H)  BUN 8 - 27 mg/dL _0 Creatinine 0.76 - 1.27 mg/dL 1.47(H) 1.56(H) 1.14  Sodium 134 - 144 mmol/L 140 141 139  Potassium 3.5 -  5.2 mmol/L 4.3 4.3 4.0  Chloride 96 - 106 mmol/L 104 108 110  CO2 20 - 29 mmol/L _0 Calcium 8.6 - 10.2 mg/dL 9.3 9.0 9.3  Total Protein 6.0 - 8.5 g/dL 7.1 7.1 7.8  Total Bilirubin 0.0 - 1.2 mg/dL 0.7 0.8 0.8  Alkaline Phos 48 - 121 IU/L 95 - 67  AST 0 - 40 IU/L _1 ALT 0 - 44 IU/L _2 Surgical pathology ([id number]) on 06/05/2020: Left axilla lymph node biopsy: follicular lymphoma.  RADIOGRAPHIC STUDIES: I have personally reviewed the radiological images as listed and agreed with the findings in the report. MR Thoracic Spine Wo Contrast  Result Date: 05/23/2020 CLINICAL DATA:  Basal cell carcinoma. T11 lesions seen at abdominal CT. EXAM: MRI THORACIC SPINE WITHOUT CONTRAST TECHNIQUE: Multiplanar, multisequence MR imaging of the thoracic spine was performed. No intravenous contrast was administered. COMPARISON:  Abdominal CT 04/25/2020. FINDINGS: Alignment:  Normal Vertebrae: Scout view of the cervical region suggests the presence of small metastatic deposits in the cervical spine, particularly at C4. No advanced disease or sign of canal compromise. No detail cervical study was done. Metastatic lesions seen throughout the thoracic spine. Most advanced involvement is on the right at the T11 vertebral body level where there is extraosseous tumor encroaching upon the intervertebral foramen on the right at T11-12, likely to compress the right T11 nerve. Otherwise, no extraosseous disease is seen. Every thoracic level is involved, with the exception of the T6 vertebra. Old minor superior endplate compression deformity at T3, unrelated to this current process. Benign appearing hemangioma within the T4 vertebral body and within the T9 vertebral body. Cord:  No cord compression or primary cord metastatic lesion. Paraspinal and other soft tissues: Otherwise negative Disc levels: No evidence of disc pathology or facet arthropathy. IMPRESSION: 1. Metastatic lesions throughout the thoracic spine. Most advanced involvement is on the right at T11 where there is extraosseous tumor encroaching upon the intervertebral foramen on the right likely to compress the right T11 nerve. Otherwise, no extraosseous disease is seen. 2. Old minor superior endplate compression deformity at T3, unrelated to this  current process. 3. Scout view of the cervical region suggests the presence of small metastatic deposits within the cervical spine, particularly at C4. No advanced disease or sign of canal compromise in the cervical region. No detail cervical study was done. Electronically Signed   By: Nelson Chimes M.D.   On: 05/23/2020 11:51   MR ABDOMEN W WO CONTRAST  Result Date: 06/05/2020 CLINICAL DATA:  Abdominal pain for 2 months, history of basal cell carcinoma, left axillary lymphadenopathy biopsied today EXAM: MRI ABDOMEN WITHOUT AND WITH CONTRAST TECHNIQUE: Multiplanar multisequence MR imaging of the abdomen was performed both before and after the administration of intravenous contrast. CONTRAST:  12m GADAVIST GADOBUTROL 1 MMOL/ML IV SOLN COMPARISON:  CT chest, 05/27/2020, CT urogram, 04/25/2020 FINDINGS: Lower chest: No acute findings. Hepatobiliary: There are occasional subcentimeter fluid signal lesions of the liver, for example a 6 mm lesion in the right lobe (series 3, image 20). A lesion described in the left liver dome on prior CT examination of the chest is not appreciated, however this portion of the examination is significantly limited by breath and cardiac motion (e.g. Series 19, image 33, 20, image IV). No solid mass, abnormal contrast enhancement or other parenchymal abnormality identified. Status post cholecystectomy. No biliary ductal dilatation. Pancreas: No mass, inflammatory changes, or other parenchymal abnormality identified. Spleen:  Within normal limits in size and appearance. Adrenals/Urinary Tract: Asymmetrically atrophic appearance of the right kidney. Fluid signal simple cysts of the left kidney. No masses identified. No evidence of hydronephrosis. Stomach/Bowel: Visualized portions within the abdomen are unremarkable. Vascular/Lymphatic: No pathologically enlarged lymph nodes identified. No abdominal aortic aneurysm demonstrated. Other:  None. Musculoskeletal: There is a lytic lesion of the  T11 vertebral body, with a significant right eccentric soft tissue component (series 15, image 20). There are numerous additional more subtle contrast enhancing lesions throughout the included spine, for example of the T12 vertebral body (series 15, image 27). IMPRESSION: 1. No intra-abdominal MR findings to explain abdominal pain. 2. No evidence of intra abdominal mass or lymphadenopathy. A previously described liver lesion in the left aspect of the liver dome is not noted, however evaluation of this particular portion of the liver is limited by breath and cardiac motion on current examination. 3. 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 4. Asymmetrically atrophic appearance of the right kidney, consistent with prior infectious, ischemic, or obstructive insult. Electronically Signed   By: Eddie Candle M.D.   On: 06/05/2020 16:00   [place] CORE BIOPSY (LYMPH NODES)  Result Date: 06/05/2020 INDICATION: LEFT AXILLARY MASS/ADENOPATHY EXAM: ULTRASOUND GUIDED CORE BIOPSY OF LEFT AXILLARY MASS MEDICATIONS: 4 mL of 2% lidocaine ANESTHESIA/SEDATION: None PROCEDURE: The procedure, risks, benefits, and alternatives were explained to the patient. Questions regarding the procedure were encouraged and answered. The patient understands and written informed consent was obtained. Time-out protocol was followed. The LEFT axilla was prepped with chlorhexidine in a sterile fashion, and a sterile drape was applied covering the operative field. Local anesthesia with 4 mL of 2% lidocaine. Under direct sonographic guidance, four 18 gauge core biopsies of the LEFT axillary mass/enlarged lymph node were obtained. Two samples were placed in formalin and 2 samples were placed in sterile saline. Procedure was tolerated very well by patient without immediate complication. COMPLICATIONS: None immediate. FINDINGS: Large heterogeneous  hypoechoic mass/lymph node in LEFT axilla approximately the 5.2 cm greatest size. IMPRESSION: Technically successful ultrasound-guided core biopsy of LEFT axillary mass/lymph node. Electronically Signed   By: Lavonia Dana M.D.   On: 06/05/2020 11:34    ASSESSMENT:  1.  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 Diona Fanti. -He had an abdominal CT on 04/25/2020 for work-up of kidney stone.  This showed T11 lesion. -MRI of the thoracic spine on 05/23/2020 showed metastatic lesions throughout the thoracic spine, most advanced involvement on the right at T11 with extraosseous tumor encroaching upon the intervertebral foramina on the right to compress the right T11 nerve. -MRI of the abdomen with and without contrast on 06/05/2020 showed no evidence of intra-abdominal mass or lymphadenopathy.  No liver lesions noted.  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 -Left axillary lymph node biopsy on 06/05/2020 consistent with follicular lymphoma, morphologically low-grade (grade 1-2) is favored although focally elevated Ki-67 can sometimes indicate more aggressive behavior.  IHC positive for CD20, CD79a, CD10, BCL6 and BCL2.  Ki-67 is overall low, but does have foci of increased proliferation. -He does not have any fevers, night sweats or weight loss.  He did have right mid back pain in March April of this year which subsided.  2.  Social/family history: -He never smoked.  He worked as a Furniture conservator/restorer.  No exposure to chemicals. -Father died of prostate  cancer and mother had breast cancer.  2 paternal aunts had breast cancer and one paternal uncle had lung cancer.  PLAN:  1.  Follicular lymphoma: -I have reviewed the images of the scans with the patient and his wife. -I have also discussed the biopsy result in detail.  He does not have any B symptoms.  However he has extensive bone involvement which is not very typical of follicular lymphoma but can be  possible. -I have recommended a whole-body PET CT scan.  If there are any high SUV areas, we will consider biopsying them to rule out transformation. -We will also check LDH, uric acid, beta-2 microglobulin, hepatitis B serology. -I will see him back after the PET scan to discuss further plan.   All questions were answered. The patient knows to call the clinic with any problems, questions or concerns.   Derek Jack, MD, 06/11/20 1:42 PM  [place] [phone]   I, Milinda Antis, am acting as a scribe for Dr. Sanda Linger.  I, Derek Jack MD, have reviewed the above documentation for accuracy and completeness, and I agree with the above.

## 2020-06-12 ENCOUNTER — Ambulatory Visit (HOSPITAL_COMMUNITY)
Admission: RE | Admit: 2020-06-12 | Discharge: 2020-06-12 | Disposition: A | Discharge status: Home / Self Care | Payer: Medicare Other | Source: Ambulatory Visit | Attending: Cardiology | Admitting: Cardiology

## 2020-06-12 ENCOUNTER — Other Ambulatory Visit: Payer: Self-pay

## 2020-06-12 DIAGNOSIS — I513 Intracardiac thrombosis, not elsewhere classified: Secondary | ICD-10-CM | POA: Insufficient documentation

## 2020-06-12 LAB — ECHOCARDIOGRAM COMPLETE
AR max vel: 2.01 cm2
AV Area VTI: 1.8 cm2
AV Area mean vel: 1.94 cm2
AV Mean grad: 4.2 mmHg
AV Peak grad: 8.1 mmHg
Ao pk vel: 1.42 m/s
Area-P 1/2: 4.59 cm2
S' Lateral: 5.06 cm

## 2020-06-12 LAB — BETA 2 MICROGLOBULIN, SERUM: Beta-2 Microglobulin: 2.4 mg/L (ref 0.6–2.4)

## 2020-06-12 MED ORDER — PERFLUTREN LIPID MICROSPHERE
1.0000 mL | INTRAVENOUS | Status: AC | PRN
  Administered 2020-06-12: 1 mL via INTRAVENOUS
  Filled 2020-06-12: qty 10

## 2020-06-12 NOTE — Progress Notes (Signed)
*  PRELIMINARY RESULTS* Echocardiogram 2D Echocardiogram with definity has been performed. Pre bp 99/67 Post bp 118/82  Leavy Cella 06/12/2020, 10:31 AM

## 2020-06-19 ENCOUNTER — Telehealth: Payer: Self-pay | Admitting: Cardiology

## 2020-06-19 NOTE — Telephone Encounter (Signed)
Just resulted   J Yamile Roedl MD 

## 2020-06-19 NOTE — Telephone Encounter (Signed)
Patient called requesting to see if recent test results are back on echo.

## 2020-06-20 NOTE — Telephone Encounter (Signed)
Echo does not show signs of a blood clot but does show the pumping function of the heart has decreased. Can he see me on Monday 9am in [place] to discuss   Zandra Abts MD

## 2020-06-20 NOTE — Telephone Encounter (Signed)
Pt voiced understanding and confirmed appt for Monday

## 2020-06-23 ENCOUNTER — Other Ambulatory Visit: Payer: Self-pay

## 2020-06-23 ENCOUNTER — Ambulatory Visit (HOSPITAL_COMMUNITY)
Admission: RE | Admit: 2020-06-23 | Discharge: 2020-06-23 | Disposition: A | Discharge status: Home / Self Care | Payer: Medicare Other | Source: Ambulatory Visit | Attending: Hematology | Admitting: Hematology

## 2020-06-23 ENCOUNTER — Ambulatory Visit (INDEPENDENT_AMBULATORY_CARE_PROVIDER_SITE_OTHER): Payer: Medicare Other | Admitting: Cardiology

## 2020-06-23 ENCOUNTER — Encounter: Payer: Self-pay | Admitting: Cardiology

## 2020-06-23 VITALS — BP 116/62 | HR 86 | Ht 72.0 in | Wt 202.4 lb

## 2020-06-23 DIAGNOSIS — I5022 Chronic systolic (congestive) heart failure: Secondary | ICD-10-CM | POA: Diagnosis not present

## 2020-06-23 DIAGNOSIS — C8294 Follicular lymphoma, unspecified, lymph nodes of axilla and upper limb: Secondary | ICD-10-CM | POA: Insufficient documentation

## 2020-06-23 DIAGNOSIS — C8290 Follicular lymphoma, unspecified, unspecified site: Secondary | ICD-10-CM | POA: Diagnosis not present

## 2020-06-23 DIAGNOSIS — I517 Cardiomegaly: Secondary | ICD-10-CM | POA: Diagnosis not present

## 2020-06-23 DIAGNOSIS — R590 Localized enlarged lymph nodes: Secondary | ICD-10-CM | POA: Diagnosis not present

## 2020-06-23 DIAGNOSIS — Z79899 Other long term (current) drug therapy: Secondary | ICD-10-CM | POA: Diagnosis not present

## 2020-06-23 DIAGNOSIS — I4891 Unspecified atrial fibrillation: Secondary | ICD-10-CM

## 2020-06-23 DIAGNOSIS — J90 Pleural effusion, not elsewhere classified: Secondary | ICD-10-CM | POA: Diagnosis not present

## 2020-06-23 LAB — GLUCOSE, CAPILLARY: Glucose-Capillary: 86 mg/dL (ref 70–99)

## 2020-06-23 MED ORDER — FUROSEMIDE 20 MG PO TABS: ORAL_TABLET | ORAL | 1 refills | Status: DC

## 2020-06-23 MED ORDER — ENTRESTO 24-26 MG PO TABS: 1.0000 | ORAL_TABLET | Freq: Two times a day (BID) | ORAL | 11 refills | Status: DC

## 2020-06-23 MED ORDER — FLUDEOXYGLUCOSE F - 18 (FDG) INJECTION
10.6000 | Freq: Once | INTRAVENOUS | Status: AC
  Administered 2020-06-23: 10.6 via INTRAVENOUS

## 2020-06-23 MED ORDER — METOPROLOL SUCCINATE ER 25 MG PO TB24: 25.0000 mg | ORAL_TABLET | Freq: Every day | ORAL | 3 refills | Status: DC

## 2020-06-23 NOTE — Progress Notes (Signed)
Clinical Summary Mr. Errington is a 74 y.o.male seen today for follow up of the following medical problems.   1.PAF - no recent palpitations - compliant with meds - no bleeding on eliquis  - no recent palpitations.   2. HTN -he is compliant with emds   3. New diagnosis systolic heart failure - 01/4626 echo 55-60% - 05/2020 echo LVEF 20-25%, mild RV dysfunction  - no SOB/DOE. Some LE edema at times mild - no chest pains - no recent viral illness - no EtOH, no drug use    4. 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    5. Abrnormal CT scan - contacted by urology about recent CT scan findings, concern for possibly mixing artifact vs thrombus in LA - echo without signs of thrombus - has been on eliquis for afib.   Past Medical History:  Diagnosis Date  . Arthritis    both hands  . Atrial fibrillation ([facility]) 10/28/2017   Dr. Harl Bowie  . Blood in urine    occ  . Bronchitis   . Cancer ([facility])    Basal cell  . Cardiomegaly 10/28/2017  . Complication of anesthesia    Mr. Wainwright developed a. fib in recovery after cysto procedure 10/2017 was transferred to [facility] and underwent successful cardioversion  . Diverticulosis of sigmoid colon 08/2017   Noted on colonoscopy  . Dysrhythmia   . External hemorrhoids 08/2017  . GERD (gastroesophageal reflux disease)   . History of colon polyps 08/2017  . History of kidney stones   . History of rectal bleeding   . History of right inguinal hernia   . Hypertension   . Nephrolithiasis 07/02/2019  . Rectal bleeding 09/02/2017   Added automatically from request for surgery [phone]  . Shortness of breath      Allergies  Allergen Reactions  . Augmentin [Amoxicillin-Pot Clavulanate] Other (See Comments)    Increased liver enzymes Has  patient had a PCN reaction causing immediate rash, facial/tongue/throat swelling, SOB or lightheadedness with hypotension: No Has patient had a PCN reaction causing severe rash involving mucus membranes or skin necrosis: No Has patient had a PCN reaction that required hospitalization: No Has patient had a PCN reaction occurring within the last 10 years: No  If all of the above answers are "NO", then may proceed with Cephalosporin use.   Marland Kitchen Flomax [Tamsulosin Hcl] Nausea Only    Dizziness  Pt is on flomax     Current Outpatient Medications  Medication Sig Dispense Refill  . acetaminophen (TYLENOL) 500 MG tablet Take 1,000 mg by mouth every 6 (six) hours as needed for moderate pain or headache.    Marland Kitchen amLODipine (NORVASC) 10 MG tablet Take 1 tablet (10 mg total) by mouth daily. 90 tablet 2  . benazepril (LOTENSIN) 40 MG tablet Take 1 tablet (40 mg total) by mouth daily. 90 tablet 0  . cetirizine (ZYRTEC) 10 MG tablet Take 1 tablet (10 mg total) by mouth daily as needed for allergies. 90 tablet 2  . ELIQUIS 5 MG TABS tablet TAKE ONE TABLET BY MOUTH 2 TIMES A DAY. 60 tablet 6  . metoprolol tartrate (LOPRESSOR) 25 MG tablet Take 1 tablet (25 mg total) by mouth 2 (two) times daily. 90 tablet 1  . omeprazole (PRILOSEC) 20 MG capsule Take  1 capsule (20 mg total) by mouth daily as needed (for heartburn). 90 capsule 2  . tamsulosin (FLOMAX) 0.4 MG CAPS capsule Take 0.4 mg by mouth at bedtime.    . VENTOLIN HFA 108 (90 Base) MCG/ACT inhaler Inhale 2 puffs into the lungs every 6 (six) hours as needed for wheezing or shortness of breath.   11   No current facility-administered medications for this visit.     Past Surgical History:  Procedure Laterality Date  . CARDIOVERSION     10/2017  . CHOLECYSTECTOMY    . 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 CLEFT PALATE REPAIR     several from childhood til 74 years old  . COLONOSCOPY  04/28/2011   Procedure: COLONOSCOPY;  Surgeon:  Rogene Houston, MD;  Location: [facility];  Service: Endoscopy;  Laterality: N/A;  . COLONOSCOPY N/A 07/18/2014   Procedure: COLONOSCOPY;  Surgeon: Rogene Houston, MD;  Location: [facility];  Service: Endoscopy;  Laterality: N/A;  830  . COLONOSCOPY N/A 09/09/2017   Procedure: COLONOSCOPY;  Surgeon: Rogene Houston, MD;  Location: [facility];  Service: Endoscopy;  Laterality: N/A;  955  . CYSTOSCOPY/URETEROSCOPY/HOLMIUM LASER/STENT PLACEMENT Bilateral 12/06/2017   Procedure: CYSTOSCOPY/RETROGRADE/URETEROSCOPY/HOLMIUM LASER/STENT EXCHANGE;  Surgeon: Festus Aloe, MD;  Location: [facility];  Service: Urology;  Laterality: Bilateral;  ONLY NEEDS 60 MIN  . HERNIA REPAIR    . POLYPECTOMY  09/09/2017   Procedure: POLYPECTOMY;  Surgeon: Rogene Houston, MD;  Location: [facility];  Service: Endoscopy;;  colon  . vocal cord surgery       Allergies  Allergen Reactions  . Augmentin [Amoxicillin-Pot Clavulanate] Other (See Comments)    Increased liver enzymes Has patient had a PCN reaction causing immediate rash, facial/tongue/throat swelling, SOB or lightheadedness with hypotension: No Has patient had a PCN reaction causing severe rash involving mucus membranes or skin necrosis: No Has patient had a PCN reaction that required hospitalization: No Has patient had a PCN reaction occurring within the last 10 years: No  If all of the above answers are "NO", then may proceed with Cephalosporin use.   Marland Kitchen Flomax [Tamsulosin Hcl] Nausea Only    Dizziness  Pt is on flomax      Family History  Problem Relation Age of Onset  . Breast cancer Mother   . Prostate cancer Father   . COPD Sister      Social History Mr. Beske reports that he has never smoked. He has never used smokeless tobacco. Mr. Overbeck reports current alcohol use of about 7.0 standard drinks of alcohol per week.   Review of Systems CONSTITUTIONAL: No weight loss, fever, chills, weakness or fatigue.  HEENT: Eyes: No  visual loss, blurred vision, double vision or yellow sclerae.No hearing loss, sneezing, congestion, runny nose or sore throat.  SKIN: No rash or itching.  CARDIOVASCULAR: per hpi RESPIRATORY: No shortness of breath, cough or sputum.  GASTROINTESTINAL: No anorexia, nausea, vomiting or diarrhea. No abdominal pain or blood.  GENITOURINARY: No burning on urination, no polyuria NEUROLOGICAL: No headache, dizziness, syncope, paralysis, ataxia, numbness or tingling in the extremities. No change in bowel or bladder control.  MUSCULOSKELETAL: No muscle, back pain, joint pain or stiffness.  LYMPHATICS: No enlarged nodes. No history of splenectomy.  PSYCHIATRIC: No history of depression or anxiety.  ENDOCRINOLOGIC: No reports of sweating, cold or heat intolerance. No polyuria or polydipsia.  Marland Kitchen   Physical Examination Today's  Vitals   06/23/20 0848  BP: 116/62  Pulse: 86  SpO2: 95%  Weight: 202 lb 6.4 oz (91.8 kg)  Height: 6' (1.829 m)   Body mass index is 27.45 kg/m.  Gen: resting comfortably, no acute distress HEENT: no scleral icterus, pupils equal round and reactive, no palptable cervical adenopathy,  CV: irregular, tach, no m/r/g, no jvd Resp: Clear to auscultation bilaterally GI: abdomen is soft, non-tender, non-distended, normal bowel sounds, no hepatosplenomegaly MSK: extremities are warm, 1+ bilateral LE edema Skin: warm, no rash Neuro:  no focal deficits Psych: appropriate affect   Diagnostic Studies  COMPARISON: CT urogram from April 26, 2020    FINDINGS:  Cardiovascular: Marked cardiac enlargement, four-chamber enlargement  but with more pronounced enlargement of LEFT atrium and RIGHT heart.  Marked LEFT atrial enlargement associated with enlargement of LEFT  atrial appendage with low attenuation in the LEFT atrial appendage.    Calcified coronary artery disease. No pericardial effusion. The  central pulmonary vasculature is engorged approximately 3 cm.     Mediastinum/Nodes: Thoracic inlet structures are unremarkable.    Bulky LEFT axillary lymph node or mass measuring 5.2 x 5.0 cm  displaying low attenuation on this early venous phase assessment,  central density approximately 60 Hounsfield units however compatible  with soft tissue density.    Small nodules in the subcutaneous fat along the LEFT anterior chest  wall (image 59, series 2) approximately 1 cm small nodule.    Lungs/Pleura: Small pulmonary nodule at the lung base on the RIGHT  (image 93, series 3)    Small nodule at the LEFT lung base (image 102, series 3) numerous  other small nodules scattered throughout the lung bases  predominantly but with a RIGHT upper lobe nodule on image 46 of  series 3 measuring 9 x 5 mm. No consolidation or sign of pleural  effusion. Additional nodules in the LEFT upper lobe, largest  approximately 5 mm (image 33,    Upper Abdomen: Post cholecystectomy. Area of hypodensity in the  cephalad aspect of the LEFT hemi liver not as well seen as on the  previous noncontrast CT due to phase of contrast enhancement subtle  area seen on today's exam. No acute process in the upper abdomen.  Nodular appearing adrenals. Presumed LEFT renal cyst is partially  imaged.    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 Other areas of metastatic  disease better evaluated on recent spinal MRI    IMPRESSION:  1. Mass or adenopathy in the LEFT axilla suspicious for metastasis.  2. Marked cardiac and LEFT atrial enlargement with low attenuation  in the LEFT atrial appendage. Thrombus versus mixing artifact in  this area. Consider echocardiography for further evaluation.  3. Multiple small pulmonary nodules suspicious for metastatic  disease in this patient with signs of spinal metastases and and  nodal or soft tissue involvement in the LEFT axilla.  4. Area of hypodensity in the cephalad aspect  of the LEFT hemi liver  not as well seen as on the previous noncontrast CT due to phase of  contrast enhancement however subtle area seen on today's exam.  Contrasted imaging of the abdomen and pelvis or PET evaluation may  be helpful for further assessment if needed for staging.  5. Destructive lesion in T11 is unchanged. Other areas of metastatic  disease better evaluated on recent spinal MRI.  6. Calcified coronary artery disease.  7. Aortic atherosclerosis.  Assessment and Plan  1. New diagnosis of systolic HF - echo with LVEF 20-25%, new findings for patient - denies any SOB/DOE, does have 1+bilateral LE edema - stop norvasc. Stop benazepril, wait 48 hrs then start entresto 24/26mg  bid. Change lopressor to toprol 25mg  daily - recent diagnosis of follcular lymphoma with staging PET scan today, hold off on ischemic evaluation until cancer staging better understood - will need    2. Afib - EKG today shows afib 108, his dynamap rates correlate poorly EKG rates, needs repeat EKG at f/u - changing lopress to toprol titrate over time to control rates. Unclear if could be causing a tachy mediated CM - EKG review Jan 2020 SR, June 2021 afib, today afib. Work on rate control, if persistent afib over time may consider trial of DCCV given his cardiomyopathy.  - continue eliuqis.   Arnoldo Lenis, M.D.

## 2020-06-23 NOTE — Patient Instructions (Signed)
Medication Instructions:  STOP Norvasc  STOP Benazapril  STOP Lopressor    START Toprol XL 25 mg daily  Take Lasix 20 mg daily as needed fo leg swelling   On 06/25/20, START Entresto 24/26 mg twice a day     *If you need a refill on your cardiac medications before your next appointment, please call your pharmacy*   Lab Work: [facility]   If you have labs (blood work) drawn today and your tests are completely normal, you will receive your results only by: Marland Kitchen MyChart Message (if you have MyChart) OR . A paper copy in the mail If you have any lab test that is abnormal or we need to change your treatment, we will call you to review the results.   Testing/Procedures: None today   Follow-Up: At [facility], you and your health needs are our priority.  As part of our continuing mission to provide you with exceptional heart care, we have created designated Provider Care Teams.  These Care Teams include your primary Cardiologist (physician) and Advanced Practice Providers (APPs -  Physician Assistants and Nurse Practitioners) who all work together to provide you with the care you need, when you need it.  We recommend signing up for the patient portal called "MyChart".  Sign up information is provided on this After Visit Summary.  MyChart is used to connect with patients for Virtual Visits (Telemedicine).  Patients are able to view lab/test results, encounter notes, upcoming appointments, etc.  Non-urgent messages can be sent to your provider as well.   To learn more about what you can do with MyChart, go to [email or web address].    Your next appointment:   3 week(s)  The format for your next appointment:   In Person  Provider:   Carlyle Dolly, MD, Bernerd Pho, PA-C or Ermalinda Barrios, PA-C   Other Instructions None     Sample Entresto 24/26 mg #28 tablest, lot [id number], exp 01/2022     Thank you for choosing [facility]  !

## 2020-06-24 DIAGNOSIS — Z0120 Encounter for dental examination and cleaning without abnormal findings: Secondary | ICD-10-CM | POA: Diagnosis not present

## 2020-06-25 ENCOUNTER — Inpatient Hospital Stay (HOSPITAL_COMMUNITY): Payer: Medicare Other | Attending: Hematology | Admitting: Hematology

## 2020-06-25 ENCOUNTER — Other Ambulatory Visit: Payer: Self-pay

## 2020-06-25 VITALS — BP 116/96 | HR 106 | Temp 98.6°F | Resp 18 | Wt 199.5 lb

## 2020-06-25 DIAGNOSIS — Z803 Family history of malignant neoplasm of breast: Secondary | ICD-10-CM | POA: Insufficient documentation

## 2020-06-25 DIAGNOSIS — Z79899 Other long term (current) drug therapy: Secondary | ICD-10-CM | POA: Diagnosis not present

## 2020-06-25 DIAGNOSIS — I4891 Unspecified atrial fibrillation: Secondary | ICD-10-CM | POA: Diagnosis not present

## 2020-06-25 DIAGNOSIS — C8298 Follicular lymphoma, unspecified, lymph nodes of multiple sites: Secondary | ICD-10-CM | POA: Insufficient documentation

## 2020-06-25 DIAGNOSIS — C3490 Malignant neoplasm of unspecified part of unspecified bronchus or lung: Secondary | ICD-10-CM

## 2020-06-25 DIAGNOSIS — Z8042 Family history of malignant neoplasm of prostate: Secondary | ICD-10-CM | POA: Insufficient documentation

## 2020-06-25 DIAGNOSIS — C8294 Follicular lymphoma, unspecified, lymph nodes of axilla and upper limb: Secondary | ICD-10-CM

## 2020-06-25 DIAGNOSIS — Z7901 Long term (current) use of anticoagulants: Secondary | ICD-10-CM | POA: Diagnosis not present

## 2020-06-25 DIAGNOSIS — I119 Hypertensive heart disease without heart failure: Secondary | ICD-10-CM | POA: Insufficient documentation

## 2020-06-25 DIAGNOSIS — K219 Gastro-esophageal reflux disease without esophagitis: Secondary | ICD-10-CM | POA: Diagnosis not present

## 2020-06-25 HISTORY — DX: Malignant neoplasm of unspecified part of unspecified bronchus or lung: C34.90

## 2020-06-25 NOTE — Progress Notes (Signed)
[place] [place], [place] [address]   CLINIC:  Medical Oncology/Hematology  PCP:  Perlie Mayo, NP [address] / [place] [address]  [phone]  REASON FOR VISIT:  Follow-up for follicular lymphoma  PRIOR THERAPY: None  CURRENT THERAPY: Under work-up  INTERVAL HISTORY:  [name], a 74 y.o. 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 Darius Smith was last seen on 06/11/2020.  Today he is accompanied by his wife. He denies having any recent infections, F/C, night sweats, or unexplained weight loss. He denies having any more right flank pain. He denies having any back or hip pain.    REVIEW OF SYSTEMS:  Review of Systems  Constitutional: Positive for fatigue (80%). Negative for appetite change, chills, diaphoresis, fever and unexpected weight change.  Respiratory: Positive for shortness of breath (w/ exertion) and wheezing.   Musculoskeletal: Positive for arthralgias (arthritic knee pains). Negative for back pain.  All other systems reviewed and are negative.   PAST MEDICAL/SURGICAL HISTORY:  Past Medical History:  Diagnosis Date  . Arthritis    both hands  . Atrial fibrillation ([facility]) 10/28/2017   Dr. Harl Bowie  . Blood in urine    occ  . Bronchitis   . Cancer ([facility])    Basal cell  . Cardiomegaly 10/28/2017  . Complication of anesthesia    Mr. Pounders developed a. fib in recovery after cysto procedure 10/2017 was transferred to [facility] and underwent successful cardioversion  . Diverticulosis of sigmoid colon 08/2017   Noted on colonoscopy  . Dysrhythmia   . External hemorrhoids 08/2017  . GERD (gastroesophageal reflux disease)   . History of colon polyps 08/2017  . History of kidney stones   . History of rectal bleeding   . History of right inguinal hernia   . Hypertension   . Nephrolithiasis 07/02/2019  . Rectal bleeding 09/02/2017   Added automatically from request for surgery [id number]  . Shortness of breath     Past Surgical History:  Procedure Laterality Date  . CARDIOVERSION     10/2017  . CHOLECYSTECTOMY    . 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 CLEFT PALATE REPAIR     several from childhood til 74 years old  . COLONOSCOPY  04/28/2011   Procedure: COLONOSCOPY;  Surgeon: Rogene Houston, MD;  [facility];  Service: Endoscopy;  Laterality: N/A;  . COLONOSCOPY N/A 07/18/2014   Procedure: COLONOSCOPY;  Surgeon: Rogene Houston, MD;  Location: [facility];  Service: Endoscopy;  Laterality: N/A;  830  . COLONOSCOPY N/A 09/09/2017   Procedure: COLONOSCOPY;  Surgeon: Rogene Houston, MD;  Location: [facility];  Service: Endoscopy;  Laterality: N/A;  955  . CYSTOSCOPY/URETEROSCOPY/HOLMIUM LASER/STENT PLACEMENT Bilateral 12/06/2017   Procedure: CYSTOSCOPY/RETROGRADE/URETEROSCOPY/HOLMIUM LASER/STENT EXCHANGE;  Surgeon: Festus Aloe, MD;  Location: [facility];  Service: Urology;  Laterality: Bilateral;  ONLY NEEDS 60 MIN  . HERNIA REPAIR    . POLYPECTOMY  09/09/2017   Procedure: POLYPECTOMY;  Surgeon: Rogene Houston, MD;  Location: [facility];  Service: Endoscopy;;  colon  . vocal cord surgery      SOCIAL HISTORY:  Social History   Socioeconomic History  . Marital status: Married    Spouse name: Not on file  . Number of children: Not on file  . Years of education: Not on file  . Highest education level: Not on file  Occupational History  . Occupation: retired  Tobacco Use  . Smoking status: Never Smoker  . Smokeless tobacco: Never Used  Vaping Use  . Vaping Use: Never used  Substance and Sexual Activity  . Alcohol use: Not Currently    Alcohol/week: 7.0 standard drinks    Types: 7 Cans of beer per week  . Drug use: No  . Sexual activity: Not on file  Other Topics Concern  . Not on file  Social History Narrative   Lives with wife [name] of 72 years April 15, 2020      2 children, 2 grandchildren   Dog: Gardner Candle      Enjoys:  being outdoors       Diet: eats all food groups    Caffeine: drink coffee in the morning, some tea, diet dr pepper   Water: 3-4 cups daily         Wears seat belt    Does not use phone while driving   [place] put up not loaded          Social Determinants of Radio broadcast assistant Strain: Low Risk   . Difficulty of Paying Living Expenses: Not hard at all  Food Insecurity: No Food Insecurity  . Worried About Charity fundraiser in the Last Year: Never true  . Ran Out of Food in the Last Year: Never true  Transportation Needs: No Transportation Needs  . Lack of Transportation (Medical): No  . Lack of Transportation (Non-Medical): No  Physical Activity: Insufficiently Active  . Days of Exercise per Week: 3 days  . Minutes of Exercise per Session: 30 min  Stress: No Stress Concern Present  . Feeling of Stress : Only a little  Social Connections: Moderately Integrated  . Frequency of Communication with Friends and Family: More than three times a week  . Frequency of Social Gatherings with Friends and Family: More than three times a week  . Attends Religious Services: More than 4 times per year  . Active Member of Clubs or Organizations: No  . Attends Archivist Meetings: Never  . Marital Status: Married  Human resources officer Violence: Not At Risk  . Fear of Current or Ex-Partner: No  . Emotionally Abused: No  . Physically Abused: No  . Sexually Abused: No    FAMILY HISTORY:  Family History  Problem Relation Age of Onset  . Breast cancer Mother   . Prostate cancer Father   . COPD Sister     CURRENT MEDICATIONS:  Current Outpatient Medications  Medication Sig Dispense Refill  . acetaminophen (TYLENOL) 500 MG tablet Take 1,000 mg by mouth every 6 (six) hours as needed for moderate pain or headache.    . cetirizine (ZYRTEC) 10 MG tablet Take 1 tablet (10 mg total) by mouth daily as needed for allergies. 90 tablet 2  . ELIQUIS 5 MG TABS tablet  TAKE ONE TABLET BY MOUTH 2 TIMES A DAY. 60 tablet 6  . furosemide (LASIX) 20 MG tablet Take 20 mg by mouth daily as needed for leg swelling 90 tablet 1  . metoprolol succinate (TOPROL XL) 25 MG 24 hr tablet Take 1 tablet (25 mg total) by mouth daily. 90 tablet 3  . omeprazole (PRILOSEC) 20 MG capsule Take 1 capsule (20 mg total) by mouth daily as needed (for heartburn). 90 capsule 2  . sacubitril-valsartan (ENTRESTO) 24-26 MG Take 1 tablet by mouth 2 (two) times daily. 60 tablet  11  . tamsulosin (FLOMAX) 0.4 MG CAPS capsule Take 0.4 mg by mouth at bedtime.    . VENTOLIN HFA 108 (90 Base) MCG/ACT inhaler Inhale 2 puffs into the lungs every 6 (six) hours as needed for wheezing or shortness of breath.   11   No current facility-administered medications for this visit.    ALLERGIES:  Allergies  Allergen Reactions  . Augmentin [Amoxicillin-Pot Clavulanate] Other (See Comments)    Increased liver enzymes Has patient had a PCN reaction causing immediate rash, facial/tongue/throat swelling, SOB or lightheadedness with hypotension: No Has patient had a PCN reaction causing severe rash involving mucus membranes or skin necrosis: No Has patient had a PCN reaction that required hospitalization: No Has patient had a PCN reaction occurring within the last 10 years: No  If all of the above answers are "NO", then may proceed with Cephalosporin use.   Marland Kitchen Flomax [Tamsulosin Hcl] Nausea Only    Dizziness  Pt is on flomax    PHYSICAL EXAM:  Performance status (ECOG): 0 - Asymptomatic  Vitals:   06/25/20 1440  BP: (!) 116/96  Pulse: (!) 106  Resp: 18  Temp: 98.6 F (37 C)  SpO2: 96%   Wt Readings from Last 3 Encounters:  06/25/20 199 lb 8.3 oz (90.5 kg)  06/23/20 202 lb 6.4 oz (91.8 kg)  06/11/20 202 lb 1.6 oz (91.7 kg)   Physical Exam Vitals reviewed.  Constitutional:      Appearance: Normal appearance.  Neurological:     General: No focal deficit present.     Mental Status: He is alert  and oriented to person, place, and time.  Psychiatric:        Mood and Affect: Mood normal.        Behavior: Behavior normal.     LABORATORY DATA:  I have reviewed the labs as listed.  CBC Latest Ref Rng & Units 06/11/2020 04/09/2020 09/22/2018  WBC 4.0 - 10.5 K/uL 7.1 8.9 7.0  Hemoglobin 13.0 - 17.0 g/dL 12.9(L) 13.5 15.5  Hematocrit 39 - 52 % 41.9 42.4 49.0  Platelets 150 - 400 K/uL 244 287 238   CMP Latest Ref Rng & Units 06/11/2020 04/09/2020 07/09/2019  Glucose 70 - 99 mg/dL 95 101(H) 121(H)  BUN 8 - 23 mg/dL _0 Creatinine 0.61 - 1.24 mg/dL 1.44(H) 1.47(H) 1.56(H)  Sodium 135 - 145 mmol/L 137 140 141  Potassium 3.5 - 5.1 mmol/L 4.4 4.3 4.3  Chloride 98 - 111 mmol/L 105 104 108  CO2 22 - 32 mmol/L _1 Calcium 8.9 - 10.3 mg/dL 8.8(L) 9.3 9.0  Total Protein 6.5 - 8.1 g/dL 7.3 7.1 7.1  Total Bilirubin 0.3 - 1.2 mg/dL 0.9 0.7 0.8  Alkaline Phos 38 - 126 U/L 65 95 -  AST 15 - 41 U/L 11(L) 11 10  ALT 0 - 44 U/L _2 Component Value Date/Time   RBC 4.65 06/11/2020 1455   MCV 90.1 06/11/2020 1455   MCV 86 04/09/2020 1111   MCH 27.7 06/11/2020 1455   MCHC 30.8 06/11/2020 1455   RDW 15.1 06/11/2020 1455   RDW 13.2 04/09/2020 1111   LYMPHSABS 1.5 06/11/2020 1455   LYMPHSABS 1.6 04/09/2020 1111   MONOABS 0.8 06/11/2020 1455   EOSABS 0.3 06/11/2020 1455   EOSABS 0.3 04/09/2020 1111   BASOSABS 0.1 06/11/2020 1455   BASOSABS 0.1 04/09/2020 1111    DIAGNOSTIC IMAGING:  I have independently reviewed the  scans and discussed with the patient. MR ABDOMEN W WO CONTRAST  Result Date: 06/05/2020 CLINICAL DATA:  Abdominal pain for 2 months, history of basal cell carcinoma, left axillary lymphadenopathy biopsied today EXAM: MRI ABDOMEN WITHOUT AND WITH CONTRAST TECHNIQUE: Multiplanar multisequence MR imaging of the abdomen was performed both before and after the administration of intravenous contrast. CONTRAST:  22m GADAVIST GADOBUTROL 1 MMOL/ML IV SOLN COMPARISON:  CT  chest, 05/27/2020, CT urogram, 04/25/2020 FINDINGS: Lower chest: No acute findings. Hepatobiliary: There are occasional subcentimeter fluid signal lesions of the liver, for example a 6 mm lesion in the right lobe (series 3, image 20). A lesion described in the left liver dome on prior CT examination of the chest is not appreciated, however this portion of the examination is significantly limited by breath and cardiac motion (e.g. Series 19, image 33, 20, image IV). No solid mass, abnormal contrast enhancement or other parenchymal abnormality identified. Status post cholecystectomy. No biliary ductal dilatation. Pancreas: No mass, inflammatory changes, or other parenchymal abnormality identified. Spleen:  Within normal limits in size and appearance. Adrenals/Urinary Tract: Asymmetrically atrophic appearance of the right kidney. Fluid signal simple cysts of the left kidney. No masses identified. No evidence of hydronephrosis. Stomach/Bowel: Visualized portions within the abdomen are unremarkable. Vascular/Lymphatic: No pathologically enlarged lymph nodes identified. No abdominal aortic aneurysm demonstrated. Other:  None. Musculoskeletal: There is a lytic lesion of the T11 vertebral body, with a significant right eccentric soft tissue component (series 15, image 20). There are numerous additional more subtle contrast enhancing lesions throughout the included spine, for example of the T12 vertebral body (series 15, image 27). IMPRESSION: 1. No intra-abdominal MR findings to explain abdominal pain. 2. No evidence of intra abdominal mass or lymphadenopathy. A previously described liver lesion in the left aspect of the liver dome is not noted, however evaluation of this particular portion of the liver is limited by breath and cardiac motion on current examination. 3. 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 4.  Asymmetrically atrophic appearance of the right kidney, consistent with prior infectious, ischemic, or obstructive insult. Electronically Signed   By: AEddie CandleM.D.   On: 06/05/2020 16:00   NM PET Image Initial (PI) Skull Base To Thigh  Result Date: 06/23/2020 CLINICAL DATA:  Initial treatment strategy for follicular lymphoma. EXAM: NUCLEAR MEDICINE PET SKULL BASE TO THIGH TECHNIQUE: 10.6 mCi F-18 FDG was injected intravenously. Full-ring PET imaging was performed from the skull base to thigh after the radiotracer. CT data was obtained and used for attenuation correction and anatomic localization. Fasting blood glucose: 86 mg/dl COMPARISON:  Chest CT on 05/27/2020 FINDINGS: Mediastinal blood-pool activity (background): SUV max = 2.3 Liver activity (reference): SUV max = 3.3 NECK: Multiple small hypermetabolic lymph nodes are seen in both parotid glands and in levels 2 and 5A bilaterally. Index lymph node on the right at level 2B measures 1.2 cm on image 17/4, and has SUV max of 11.4. Index lymph node along the left at level 2B measures 1.0 cm on image 26/4, and has SUV max of 6.7. Focus of hypermetabolic activity in the left nasopharynx has SUV max of 5.1. Hypermetabolic soft tissue nodule in the subcutaneous tissues of the left posterior scalp measuring 1.8 x 1.1 cm on image 14/4, has SUV max of 10.4. A focus of hypermetabolic activity is also seen in the left thyroid lobe, with SUV max of 5.0. Incidental CT  findings:  None. CHEST: Left axillary lymphadenopathy is seen which measures 4.7 x 4.6 cm on image 67/4, with SUV max of 16.8. A sub-cm right axillary lymph node is seen on image 70/4, which has SUV max of 2.7. No hypermetabolic mediastinal or hilar lymph nodes. Incidental CT findings: Tiny bilateral pleural effusions, without hypermetabolic activity. Moderate to severe cardiomegaly. ABDOMEN/PELVIS: No abnormal hypermetabolic activity within the liver, pancreas, adrenal glands, or spleen. A few sub-cm  lymph nodes are seen in the small bowel mesentery, 1 of which is hypermetabolic with SUV max of 4.7. Mild hypermetabolic lymphadenopathy is seen both inguinal regions, with index node on the left measuring 9 mm on image 199/4, with SUV max of 8.8. Incidental CT findings:  None. SKELETON: Multiple hypermetabolic bone lesions are seen throughout the spine and pelvis, and involving both hips, some corresponding to lytic bone lesions seen on CT images. Index hypermetabolic lesion involving the T11 vertebra has SUV max of 15.8. Incidental CT findings:  None. IMPRESSION: Hypermetabolic lymphadenopathy in the neck, axillary regions, small bowel mesentery, and inguinal regions, consistent with lymphoma. (Deauville score 5). Hypermetabolic bone lesions throughout the spine, pelvis, and bilateral hips. Electronically Signed   By: Marlaine Hind M.D.   On: 06/23/2020 16:21   ECHOCARDIOGRAM COMPLETE  Result Date: 06/12/2020    ECHOCARDIOGRAM REPORT   Patient Name:   ISAI GOTTLIEB Date of Exam: 06/12/2020 Medical Rec #:  [id number]     Height:       72.0 in Accession #:    [id number]    Weight:       202.1 lb Date of Birth:  [birth year]      BSA:          2.140 m Patient Age:    55 years      BP:           118/82 mmHg Patient Gender: M             HR:           109 bpm. Exam Location:  [facility] Procedure: 2D Echo and Intracardiac Opacification Agent Indications:    Left atrial thrombus  History:        Patient has prior history of Echocardiogram examinations, most                 recent 11/23/2017. Arrythmias:Atrial Fibrillation; Risk                 Factors:Hypertension and Non-Smoker. GERD, Right Flank Pain.  Sonographer:    Leavy Cella RDCS (AE) Referring Phys: [id number] [place]  1. Left ventricular ejection fraction, by estimation, is 20 to 25%. The left ventricle has severely decreased function. The left ventricle demonstrates global hypokinesis. There is mild left ventricular hypertrophy. Left  ventricular diastolic parameters  are indeterminate.  2. Right ventricular systolic function is mildly reduced. The right ventricular size is moderately enlarged.  3. Left atrial size was severely dilated.  4. Right atrial size was mildly dilated.  5. The mitral valve is normal in structure. Trivial mitral valve regurgitation. No evidence of mitral stenosis.  6. The aortic valve was not well visualized. There is mild calcification of the aortic valve. There is mild thickening of the aortic valve. Aortic valve regurgitation is not visualized. No aortic stenosis is present.  7. The inferior vena cava is normal in size with greater than 50% respiratory variability, suggesting right atrial pressure of 3 mmHg. FINDINGS  Left Ventricle: Global  hypokinesis, the anteroseptal wall is akinetic. Left ventricular ejection fraction, by estimation, is 20 to 25%. The left ventricle has severely decreased function. The left ventricle demonstrates global hypokinesis. Definity contrast agent was given IV to delineate the left ventricular endocardial borders. The left ventricular internal cavity size was normal in size. There is mild left ventricular hypertrophy. Left ventricular diastolic parameters are indeterminate. Right Ventricle: The right ventricular size is moderately enlarged. Right vetricular wall thickness was not assessed. Right ventricular systolic function is mildly reduced. Left Atrium: Left atrial size was severely dilated. Right Atrium: Right atrial size was mildly dilated. Pericardium: There is no evidence of pericardial effusion. Mitral Valve: The mitral valve is normal in structure. Trivial mitral valve regurgitation. No evidence of mitral valve stenosis. Tricuspid Valve: The tricuspid valve is normal in structure. Tricuspid valve regurgitation is mild . No evidence of tricuspid stenosis. Aortic Valve: The aortic valve was not well visualized. There is mild calcification of the aortic valve. There is mild  thickening of the aortic valve. There is mild aortic valve annular calcification. Aortic valve regurgitation is not visualized. No aortic stenosis is present. Aortic valve mean gradient measures 4.2 mmHg. Aortic valve peak gradient measures 8.1 mmHg. Aortic valve area, by VTI measures 1.80 cm. Pulmonic Valve: The pulmonic valve was not well visualized. Pulmonic valve regurgitation is not visualized. No evidence of pulmonic stenosis. Aorta: The aortic root is normal in size and structure. Pulmonary Artery: Indeterminant PASP, inadequate TR jet. Venous: The inferior vena cava is normal in size with greater than 50% respiratory variability, suggesting right atrial pressure of 3 mmHg. IAS/Shunts: The interatrial septum was not well visualized.  LEFT VENTRICLE PLAX 2D LVIDd:         5.64 cm  Diastology LVIDs:         5.06 cm  LV e' lateral:   7.50 cm/s LV PW:         1.07 cm  LV E/e' lateral: 11.4 LV IVS:        1.02 cm LVOT diam:     2.40 cm LV SV:         48 LV SV Index:   22 LVOT Area:     4.52 cm  RIGHT VENTRICLE RV S prime:     6.31 cm/s TAPSE (M-mode): 1.6 cm LEFT ATRIUM              Index       RIGHT ATRIUM           Index LA diam:        5.80 cm  2.71 cm/m  RA Area:     26.10 cm LA Vol (A2C):   121.0 ml 56.54 ml/m RA Volume:   85.50 ml  39.95 ml/m LA Vol (A4C):   69.2 ml  32.34 ml/m LA Biplane Vol: 93.5 ml  43.69 ml/m  AORTIC VALVE AV Area (Vmax):    2.01 cm AV Area (Vmean):   1.94 cm AV Area (VTI):     1.80 cm AV Vmax:           142.47 cm/s AV Vmean:          98.284 cm/s AV VTI:            0.265 m AV Peak Grad:      8.1 mmHg AV Mean Grad:      4.2 mmHg LVOT Vmax:         63.35 cm/s LVOT Vmean:  42.211 cm/s LVOT VTI:          0.106 m LVOT/AV VTI ratio: 0.40  AORTA Ao Root diam: 3.60 cm MITRAL VALVE               TRICUSPID VALVE MV Area (PHT): 4.59 cm    TR Peak grad:   16.2 mmHg MV Decel Time: 165 msec    TR Vmax:        201.00 cm/s MV E velocity: 85.25 cm/s                            SHUNTS                             Systemic VTI:  0.11 m                            Systemic Diam: 2.40 cm Carlyle Dolly MD Electronically signed by Carlyle Dolly MD Signature Date/Time: 06/12/2020/12:15:17 PM    Final    [place] CORE BIOPSY (LYMPH NODES)  Result Date: 06/05/2020 INDICATION: LEFT AXILLARY MASS/ADENOPATHY EXAM: ULTRASOUND GUIDED CORE BIOPSY OF LEFT AXILLARY MASS MEDICATIONS: 4 mL of 2% lidocaine ANESTHESIA/SEDATION: None PROCEDURE: The procedure, risks, benefits, and alternatives were explained to the patient. Questions regarding the procedure were encouraged and answered. The patient understands and written informed consent was obtained. Time-out protocol was followed. The LEFT axilla was prepped with chlorhexidine in a sterile fashion, and a sterile drape was applied covering the operative field. Local anesthesia with 4 mL of 2% lidocaine. Under direct sonographic guidance, four 18 gauge core biopsies of the LEFT axillary mass/enlarged lymph node were obtained. Two samples were placed in formalin and 2 samples were placed in sterile saline. Procedure was tolerated very well by patient without immediate complication. COMPLICATIONS: None immediate. FINDINGS: Large heterogeneous hypoechoic mass/lymph node in LEFT axilla approximately the 5.2 cm greatest size. IMPRESSION: Technically successful ultrasound-guided core biopsy of LEFT axillary mass/lymph node. Electronically Signed   By: Lavonia Dana M.D.   On: 06/05/2020 11:34     ASSESSMENT:  1.  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 Diona Fanti. -He had an abdominal CT on 04/25/2020 for work-up of kidney stone.  This showed T11 lesion. -MRI of the thoracic spine on 05/23/2020 showed metastatic lesions throughout the thoracic spine, most advanced involvement on the right at T11 with extraosseous tumor encroaching upon the intervertebral foramina on the right to compress the right T11 nerve. -MRI of the abdomen with and  without contrast on 06/05/2020 showed no evidence of intra-abdominal mass or lymphadenopathy.  No liver lesions noted.  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 -Left axillary lymph node biopsy on 06/05/2020 consistent with follicular lymphoma, morphologically low-grade (grade 1-2) is favored although focally elevated Ki-67 can sometimes indicate more aggressive behavior.  IHC positive for CD20, CD79a, CD10, BCL6 and BCL2.  Ki-67 is overall low, but does have foci of increased proliferation. -PET CT scan on 06/23/2020 showed multiple small hypermetabolic lymph nodes in the neck, maximum SUV 11.4.  Left axillary adenopathy 4.7 x 4.6 cm with SUV 16.8.  Few subcentimeter lymph nodes in the small bowel mesentery with SUV 4.7.  Mild hypermetabolic lymphadenopathy in both inguinal regions with SUV 8.8.  Multiple hypermetabolic bone lesions throughout the spine and pelvis involving both hips.  2.  Social/family history: -He never smoked.  He worked as a Furniture conservator/restorer.  No exposure to chemicals. -Father died of prostate cancer and mother had breast cancer.  2 paternal aunts had breast cancer and one paternal uncle had lung cancer.   PLAN:  1.  Stage IVa follicular lymphoma: -Reviewed labs which showed normal LDH and beta-2 microglobulin.  No cytopenias noted on CBC. -Reviewed PET scan from 06/23/2020.  Maximum SUV was in the left axillary lymph node around 15.  There are also bone lesions with maximum SUV 15.  Overall it showed hypermetabolic lymphadenopathy in the neck, axillary region, small bowel mesentery and inguinal region with multiple hypermetabolic bone lesions throughout the spine, pelvis and bilateral hips. -He is completely asymptomatic and pain-free.  No B symptoms. -I did not recommend any treatment. -I discussed the indications for treatment including disease related severe fatigue, drenching night sweats, unintentional weight loss, fever without infection, threatened  endorgan function, progressive bulky disease with painful lymphadenopathy, progressive anemia and thrombocytopenia.  He does not have any indications at this time. -We will follow him in 3 months with repeat labs.  Orders placed this encounter:  No orders of the defined types were placed in this encounter.    Derek Jack, MD [place] [phone]   I, Milinda Antis, am acting as a scribe for Dr. Sanda Linger.  I, Derek Jack MD, have reviewed the above documentation for accuracy and completeness, and I agree with the above.

## 2020-06-25 NOTE — Patient Instructions (Signed)
[place] at [facility] Discharge Instructions  You were seen today by Dr. Delton Coombes. 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 If you develop any new symptoms, including recurrent infections, consistent fevers, chills, drenching night sweats, unexplained weight loss, or new bone pains, call the office immediately. Dr. Delton Coombes will see you back in 3 months for labs and follow up.   Thank you for choosing [place] at [facility] to provide your oncology and hematology care.  To afford each patient quality time with our provider, please arrive at least 15 minutes before your scheduled appointment time.   If you have a lab appointment with the [place] please come in thru the Main Entrance and check in at the main information desk  You need to re-schedule your appointment should you arrive 10 or more minutes late.  We strive to give you quality time with our providers, and arriving late affects you and other patients whose appointments are after yours.  Also, if you no show three or more times for appointments you may be dismissed from the clinic at the providers discretion.     Again, thank you for choosing [facility].  Our hope is that these requests will decrease the amount of time that you wait before being seen by our physicians.       _____________________________________________________________  Should you have questions after your visit to [facility], please contact our office at [phone] between the hours of 8:00 a.m. and 4:30 p.m.  Voicemails left after 4:00 p.m. will not be returned until the following business day.  For prescription refill requests, have your pharmacy contact our office and allow 72 hours.    Cancer Center Support Programs:   > Cancer Support Group  2nd Tuesday of the month 1pm-2pm, Journey Room

## 2020-07-06 ENCOUNTER — Encounter (HOSPITAL_COMMUNITY): Payer: Self-pay

## 2020-07-06 ENCOUNTER — Emergency Department (HOSPITAL_COMMUNITY): Payer: Medicare Other

## 2020-07-06 ENCOUNTER — Other Ambulatory Visit: Payer: Self-pay

## 2020-07-06 ENCOUNTER — Emergency Department (HOSPITAL_COMMUNITY)
Admission: EM | Admit: 2020-07-06 | Discharge: 2020-07-06 | Disposition: A | Discharge status: Home / Self Care | Payer: Medicare Other | Attending: Emergency Medicine | Admitting: Emergency Medicine

## 2020-07-06 DIAGNOSIS — J309 Allergic rhinitis, unspecified: Secondary | ICD-10-CM | POA: Insufficient documentation

## 2020-07-06 DIAGNOSIS — Z20822 Contact with and (suspected) exposure to covid-19: Secondary | ICD-10-CM | POA: Diagnosis not present

## 2020-07-06 DIAGNOSIS — J9811 Atelectasis: Secondary | ICD-10-CM | POA: Diagnosis not present

## 2020-07-06 DIAGNOSIS — Z79899 Other long term (current) drug therapy: Secondary | ICD-10-CM | POA: Insufficient documentation

## 2020-07-06 DIAGNOSIS — Z7901 Long term (current) use of anticoagulants: Secondary | ICD-10-CM | POA: Diagnosis not present

## 2020-07-06 DIAGNOSIS — I517 Cardiomegaly: Secondary | ICD-10-CM | POA: Diagnosis not present

## 2020-07-06 DIAGNOSIS — I10 Essential (primary) hypertension: Secondary | ICD-10-CM | POA: Diagnosis not present

## 2020-07-06 DIAGNOSIS — R0981 Nasal congestion: Secondary | ICD-10-CM | POA: Diagnosis present

## 2020-07-06 DIAGNOSIS — R059 Cough, unspecified: Secondary | ICD-10-CM | POA: Diagnosis not present

## 2020-07-06 DIAGNOSIS — J90 Pleural effusion, not elsewhere classified: Secondary | ICD-10-CM | POA: Diagnosis not present

## 2020-07-06 LAB — CBC
HCT: 45.6 % (ref 39.0–52.0)
Hemoglobin: 14.3 g/dL (ref 13.0–17.0)
MCH: 28.6 pg (ref 26.0–34.0)
MCHC: 31.4 g/dL (ref 30.0–36.0)
MCV: 91.2 fL (ref 80.0–100.0)
Platelets: 235 10*3/uL (ref 150–400)
RBC: 5 MIL/uL (ref 4.22–5.81)
RDW: 15.6 % — ABNORMAL HIGH (ref 11.5–15.5)
WBC: 7.5 10*3/uL (ref 4.0–10.5)
nRBC: 0 % (ref 0.0–0.2)

## 2020-07-06 LAB — BASIC METABOLIC PANEL
Anion gap: 7 (ref 5–15)
BUN: 24 mg/dL — ABNORMAL HIGH (ref 8–23)
CO2: 22 mmol/L (ref 22–32)
Calcium: 8.4 mg/dL — ABNORMAL LOW (ref 8.9–10.3)
Chloride: 107 mmol/L (ref 98–111)
Creatinine, Ser: 1.66 mg/dL — ABNORMAL HIGH (ref 0.61–1.24)
GFR, Estimated: 40 mL/min — ABNORMAL LOW (ref >60)
Glucose, Bld: 126 mg/dL — ABNORMAL HIGH (ref 70–99)
Potassium: 4.5 mmol/L (ref 3.5–5.1)
Sodium: 136 mmol/L (ref 135–145)

## 2020-07-06 LAB — RESPIRATORY PANEL BY RT PCR (FLU A&B, COVID)
Influenza A by PCR: NEGATIVE
Influenza B by PCR: NEGATIVE
SARS Coronavirus 2 by RT PCR: NEGATIVE

## 2020-07-06 LAB — TSH: TSH: 1.242 u[IU]/mL (ref 0.350–4.500)

## 2020-07-06 LAB — MAGNESIUM: Magnesium: 1.8 mg/dL (ref 1.7–2.4)

## 2020-07-06 MED ORDER — PREDNISONE 20 MG PO TABS: 20.0000 mg | ORAL_TABLET | Freq: Every day | ORAL | 0 refills | Status: AC

## 2020-07-06 NOTE — ED Provider Notes (Addendum)
[facility] EMERGENCY DEPARTMENT Provider Note   CSN: [id number] Arrival date & time: 07/06/20  4098     History Chief Complaint  Patient presents with  . Cough    Darius Smith is a 74 y.o. male.  HPI 74 year old male with a history of atrial fibrillation on Eliquis, hypertension, lymphoma presents to the ER with complaints of nasal congestion, sore throat, dry nonproductive cough since Friday after he mowed his lawn.  Patient reports repetitive coughing which is making his sides hurt.  He denies any fevers, chills, hemoptysis, abdominal pain, nausea, vomiting or chest pain.  He says he has shortness of breath but this has been chronic and going on for "years".  He does not have a history of COPD.  He denies any wheezing.  Has not noticed any lower extremity swelling.  He states that he was diagnosed with stage IV lymphoma but is currently not on chemotherapy.  He has not taken anything for his symptoms as he is not sure what is compatible with his Eliquis.  He is fully vaccinated for Covid.  Denies any drooling, difficulty swallowing.   Patient Active Problem List   Diagnosis Date Noted  . 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 Right flank pain 04/09/2020  . Hematuria 04/09/2020  . Atrial fibrillation ([place]) 03/20/2020  . Allergic rhinitis 07/02/2019  . Essential hypertension 08/08/2014  . GERD (gastroesophageal reflux disease) 08/08/2014    Past Surgical History:  Procedure Laterality Date  . CARDIOVERSION     10/2017  . CHOLECYSTECTOMY    . 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 CLEFT PALATE REPAIR     several from childhood til 74 years old  . COLONOSCOPY  04/28/2011   Procedure: COLONOSCOPY;  Surgeon: Rogene Houston, MD;  Location: [facility];  Service: Endoscopy;  Laterality: N/A;  . COLONOSCOPY N/A 07/18/2014   Procedure: COLONOSCOPY;  Surgeon: Rogene Houston, MD;  Location: [facility] END[facility] SUITE;  Service: Endoscopy;  Laterality: N/A;  830  .  COLONOSCOPY N/A 09/09/2017   Procedure: COLONOSCOPY;  Surgeon: Rogene Houston, MD;  Location: [facility];  Service: Endoscopy;  Laterality: N/A;  955  . CYSTOSCOPY/URETEROSCOPY/HOLMIUM LASER/STENT PLACEMENT Bilateral 12/06/2017   Procedure: CYSTOSCOPY/RETROGRADE/URETEROSCOPY/HOLMIUM LASER/STENT EXCHANGE;  Surgeon: Festus Aloe, MD;  Location: [facility] ORS;  Service: Urology;  Laterality: Bilateral;  ONLY NEEDS 60 MIN  . HERNIA REPAIR    . POLYPECTOMY  09/09/2017   Procedure: POLYPECTOMY;  Surgeon: Rogene Houston, MD;  Location: [facility];  Service: Endoscopy;;  colon  . vocal cord surgery         Family History  Problem Relation Age of Onset  . Breast cancer Mother   . Prostate cancer Father   . COPD Sister     Social History   Tobacco Use  . Smoking status: Never Smoker  . Smokeless tobacco: Never Used  Vaping Use  . Vaping Use: Never used  Substance Use Topics  . Alcohol use: Not Currently    Alcohol/week: 7.0 standard drinks    Types: 7 Cans of beer per week  . Drug use: No    Home Medications Prior to Admission medications   Medication Sig Start Date End Date Taking? Authorizing Provider  acetaminophen (TYLENOL) 500 MG tablet Take 1,000 mg by mouth every 6 (six) hours as needed for moderate pain or headache.   Yes [provider]  cetirizine (ZYRTEC) 10 MG tablet Take 1  tablet (10 mg total) by mouth daily as needed for allergies. 01/01/20  Yes Corum, Rex Kras, MD  ELIQUIS 5 MG TABS tablet TAKE ONE TABLET BY MOUTH 2 TIMES A DAY. Patient taking differently: Take 5 mg by mouth 2 (two) times daily.  04/21/20  Yes BranchAlphonse Guild, MD  metoprolol succinate (TOPROL XL) 25 MG 24 hr tablet Take 1 tablet (25 mg total) by mouth daily. 06/23/20  Yes Branch, Alphonse Guild, MD  sacubitril-valsartan (ENTRESTO) 24-26 MG Take 1 tablet by mouth 2 (two) times daily. 06/23/20  Yes Branch, Alphonse Guild, MD  VENTOLIN HFA 108 [phone] Base) MCG/ACT inhaler Inhale 2 puffs into the lungs  every 6 (six) hours as needed for wheezing or shortness of breath.  08/01/17  Yes [provider]  furosemide (LASIX) 20 MG tablet Take 20 mg by mouth daily as needed for leg swelling 06/23/20   Arnoldo Lenis, MD  omeprazole (PRILOSEC) 20 MG capsule Take 1 capsule (20 mg total) by mouth daily as needed (for heartburn). Patient not taking: Reported on 07/06/2020 01/01/20   Maryruth Hancock, MD    Allergies    Augmentin [amoxicillin-pot clavulanate] and Flomax [tamsulosin hcl]  Review of Systems   Review of Systems  Constitutional: Negative for activity change, appetite change, chills, fever and unexpected weight change.  HENT: Positive for congestion, postnasal drip, rhinorrhea, sinus pressure, sneezing and sore throat. Negative for ear pain, hearing loss, mouth sores, sinus pain, tinnitus, trouble swallowing and voice change.   Eyes: Negative for pain and visual disturbance.  Respiratory: Positive for cough. Negative for shortness of breath and wheezing.   Cardiovascular: Negative for chest pain and palpitations.  Gastrointestinal: Negative for abdominal pain, nausea and vomiting.  Genitourinary: Negative for dysuria and hematuria.  Musculoskeletal: Negative for arthralgias and back pain.  Skin: Negative for color change and rash.  Neurological: Negative for seizures, syncope, weakness and headaches.  Psychiatric/Behavioral: Negative for confusion.  All other systems reviewed and are negative.   Physical Exam Updated Vital Signs BP (!) 127/107   Pulse 76   Temp 98.5 F (36.9 C) (Oral)   Resp 18   Ht 6' (1.829 m)   Wt 90 kg   SpO2 96%   BMI 26.91 kg/m   Physical Exam Vitals and nursing note reviewed.  Constitutional:      Appearance: He is well-developed. He is obese. He is not ill-appearing or toxic-appearing.  HENT:     Head: Normocephalic and atraumatic.     Right Ear: Tympanic membrane normal.     Left Ear: Tympanic membrane normal.     Nose: Nose normal.      Mouth/Throat:     Mouth: Mucous membranes are moist.     Pharynx: Oropharynx is clear. No oropharyngeal exudate or posterior oropharyngeal erythema.     Comments: Uvula midline, tolerating secretions well, no sublingual swelling Eyes:     Extraocular Movements: Extraocular movements intact.     Conjunctiva/sclera: Conjunctivae normal.     Pupils: Pupils are equal, round, and reactive to light.  Cardiovascular:     Rate and Rhythm: Normal rate and regular rhythm.     Pulses: Normal pulses.     Heart sounds: Normal heart sounds. No murmur heard.   Pulmonary:     Effort: Pulmonary effort is normal. No respiratory distress.     Breath sounds: Normal breath sounds. No wheezing, rhonchi or rales.  Abdominal:     General: Abdomen is flat.  Palpations: Abdomen is soft.     Tenderness: There is no abdominal tenderness.  Musculoskeletal:     Cervical back: Normal range of motion and neck supple.     Right lower leg: No edema.     Left lower leg: No edema.  Skin:    General: Skin is warm and dry.     Findings: No erythema or rash.  Neurological:     General: No focal deficit present.     Mental Status: He is alert and oriented to person, place, and time.  Psychiatric:        Mood and Affect: Mood normal.        Behavior: Behavior normal.     ED Results / Procedures / Treatments   Labs (all labs ordered are listed, but only abnormal results are displayed) Labs Reviewed  CBC - Abnormal; Notable for the following components:      Result Value   RDW 15.6 (*)    All other components within normal limits  BASIC METABOLIC PANEL - Abnormal; Notable for the following components:   Glucose, Bld 126 (*)    BUN 24 (*)    Creatinine, Ser 1.66 (*)    Calcium 8.4 (*)    GFR, Estimated 40 (*)    All other components within normal limits  RESPIRATORY PANEL BY RT PCR (FLU A&B, COVID)    EKG None  Radiology DG Chest Portable 1 View  Result Date: 07/06/2020 CLINICAL DATA:  Cough,  sneezing, sinus irritation EXAM: PORTABLE CHEST 1 VIEW COMPARISON:  CT chest dated 05/27/2020 FINDINGS: Increased interstitial markings in the perihilar lungs, right greater than left, favoring very mild perihilar edema. Mild right basilar opacity, favoring a small right pleural effusion with associated right basilar atelectasis. No pneumothorax. Cardiomegaly. IMPRESSION: Cardiomegaly with suspected mild perihilar edema and small right pleural effusion. Electronically Signed   By: Julian Hy M.D.   On: 07/06/2020 10:25    Procedures Procedures (including critical care time)  Medications Ordered in ED Medications - No data to display  ED Course  I have reviewed the triage vital signs and the nursing notes.  Pertinent labs & imaging results that were available during my care of the patient were reviewed by me and considered in my medical decision making (see chart for details).    MDM Rules/Calculators/A&P                         Patient with nasal congestion, drainage, dry nonproductive cough since Friday after mowing his lawn On presentation, he is alert, oriented, nontoxic-appearing, no acute distress, resting comfortably in ER bed, speaking in full sentences.  He is slightly hypertensive, however afebrile, not hypoxic or tachypneic.  Vitals reassuring.  Physical exam with clear lung sounds, soft nontender abdomen, no notable lower extremity edema.  Patient denies any fevers, chills, chest pain.  He is fully vaccinated.  He has had ongoing shortness of breath for years per the patient) this is also reflected in the patient's history), however he does not endorse any worsening shortness of breath and my suspicion for PE is low at this time.  Throat without erythema, uvula midline.  Low concern for abscess, Ludwig's angina.  We will do basic labs and chest x-ray to rule out pneumonia, however suspect that his symptoms are secondary to allergies.  He does not report any throat closing,  inability to swallow, hives.  Labs personally ordered and interpreted by me -CBC without leukocytosis -  BMP without any electrolyte abnormalities, his creatinine today is 1.66 and BUN of 24, he does have baseline CKD but no evidence of a AKI.  Imaging ordered and interpreted by me -Chest x-ray with small pleural effusion and cardiomegaly, however no evidence of pneumonia.  Overall work-up reassuring.  Low concern for ACS, PE, pneumonia, anaphylaxis.  Covid swab is pending.  Suspect that this is secondary to allergies.  Patient was instructed to use over-the-counter Flonase, Zyrtec or Claritin.  Will send home with short course of steroids.  Encourage PCP follow-up.  He voiced understanding and is agreeable.  Return precautions discussed.  At this stage in the ED course, the patient has been medically screened and stable for discharge  *Addendum- pt had stated that's were ordered by Dr. Harl Bowie to have done tomorrow, he wanted to see if we could do those labs today. Per review, added on magnesium and TSH per Dr. Nelly Laurence orders.   Patient was seen and evaluated by Dr. Karle Starch who is agreeable to the above plan and disposition   Final Clinical Impression(s) / ED Diagnoses Final diagnoses:  Allergic rhinitis, unspecified seasonality, unspecified trigger    Rx / DC Orders ED Discharge Orders    None          Lyndel Safe 07/06/20 1124    Truddie Hidden, MD 07/06/20 1347

## 2020-07-06 NOTE — Discharge Instructions (Addendum)
Your workup was overall reassuring.  Your Covid test is still pending, you may check this via MyChart.  If this is positive, you will also receive a call.  I suspect that your symptoms are secondary to allergies.  Please take over-the-counter Flonase, Zyrtec or Claritin for your symptoms.  I will also send you home with a short course of steroids.  Please make sure to follow-up with your primary care doctor.  Return to the ER for any new or worsening symptoms.

## 2020-07-06 NOTE — ED Triage Notes (Addendum)
Pt reports that he mowed the grass Friday night and after that he began sneezing, sinus drainage , throat irritation and cough. Pt reports coughing is worse. Slight SOB

## 2020-07-11 ENCOUNTER — Encounter: Payer: Self-pay | Admitting: Family Medicine

## 2020-07-11 ENCOUNTER — Other Ambulatory Visit: Payer: Self-pay

## 2020-07-11 ENCOUNTER — Encounter: Payer: Self-pay | Admitting: Cardiology

## 2020-07-11 ENCOUNTER — Ambulatory Visit: Payer: Medicare Other | Admitting: Cardiology

## 2020-07-11 VITALS — BP 152/70 | HR 131 | Resp 16 | Ht 72.0 in | Wt 206.8 lb

## 2020-07-11 DIAGNOSIS — I5022 Chronic systolic (congestive) heart failure: Secondary | ICD-10-CM | POA: Diagnosis not present

## 2020-07-11 DIAGNOSIS — I4891 Unspecified atrial fibrillation: Secondary | ICD-10-CM

## 2020-07-11 MED ORDER — METOPROLOL SUCCINATE ER 50 MG PO TB24: 50.0000 mg | ORAL_TABLET | Freq: Every day | ORAL | 3 refills | Status: DC

## 2020-07-11 MED ORDER — VENTOLIN HFA 108 (90 BASE) MCG/ACT IN AERS: 2.0000 | INHALATION_SPRAY | Freq: Four times a day (QID) | RESPIRATORY_TRACT | 3 refills | Status: AC | PRN

## 2020-07-11 NOTE — Telephone Encounter (Signed)
Please refill inhaler with 2 refills on it. Thank you kindly

## 2020-07-11 NOTE — Progress Notes (Signed)
Clinical Summary Darius Smith is a 74 y.o.male seen today for follow up of the following medical problems. This is a focused visit for his history of PAF and new diagnosis of systolic heart failure.   1.PAF - no recent palpitations - compliant with meds - no bleeding on eliquis  - no recent palpitations.   - HRs elevated last visit - no recent palpitations. Currently on taking prednisone.     2. New diagnosis systolic heart failure - 09/9415 echo 55-60% - 05/2020 echo LVEF 20-25%, mild RV dysfunction  - no SOB/DOE. Some LE edema at times mild - no chest pains - no recent viral illness - no EtOH, no drug use  - last visit we stopped benazepril, started entresto 24/26mg  bid. Changed lopressor to toprol 25mg  daily. Tolerating well withuot side effects   3. 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    4. Abrnormal CT scan - contacted by urology about recent CT scan findings, concern for possibly mixing artifact vs thrombus in LA - echo without signs of thrombus - has been on eliquis for afib.     Past Medical History:  Diagnosis Date  . Arthritis    both hands  . Atrial fibrillation ([place]) 10/28/2017   Dr. Harl Bowie  . Blood in urine    occ  . Bronchitis   . Cancer (HCC)    Basal cell  . Cardiomegaly 10/28/2017  . Complication of anesthesia    Darius Smith developed a. fib in recovery after cysto procedure 10/2017 was transferred to [facility] and underwent successful cardioversion  . Diverticulosis of sigmoid colon 08/2017   Noted on colonoscopy  . Dysrhythmia   . External hemorrhoids 08/2017  . GERD (gastroesophageal reflux disease)   . History of colon polyps 08/2017  . History of kidney stones   . History of rectal bleeding   . History of right  inguinal hernia   . Hypertension   . Nephrolithiasis 07/02/2019  . Rectal bleeding 09/02/2017   Added automatically from request for surgery [phone]  . Shortness of breath      Allergies  Allergen Reactions  . Augmentin [Amoxicillin-Pot Clavulanate] Other (See Comments)    Increased liver enzymes Has patient had a PCN reaction causing immediate rash, facial/tongue/throat swelling, SOB or lightheadedness with hypotension: No Has patient had a PCN reaction causing severe rash involving mucus membranes or skin necrosis: No Has patient had a PCN reaction that required hospitalization: No Has patient had a PCN reaction occurring within the last 10 years: No  If all of the above answers are "NO", then may proceed with Cephalosporin use.   Marland Kitchen Flomax [Tamsulosin Hcl] Nausea Only    Dizziness  Pt is on flomax     Current Outpatient Medications  Medication Sig Dispense Refill  . acetaminophen (TYLENOL) 500 MG tablet Take 1,000 mg by mouth every 6 (six) hours as needed for moderate pain or headache.    . cetirizine (ZYRTEC) 10 MG tablet Take 1 tablet (10 mg total) by mouth daily as needed for allergies. 90 tablet 2  . ELIQUIS 5 MG TABS tablet TAKE ONE TABLET BY MOUTH 2 TIMES A DAY. (Patient taking differently: Take 5 mg by mouth 2 (two) times daily. ) 60 tablet 6  . furosemide (LASIX) 20 MG  tablet Take 20 mg by mouth daily as needed for leg swelling 90 tablet 1  . metoprolol succinate (TOPROL XL) 25 MG 24 hr tablet Take 1 tablet (25 mg total) by mouth daily. 90 tablet 3  . omeprazole (PRILOSEC) 20 MG capsule Take 1 capsule (20 mg total) by mouth daily as needed (for heartburn). (Patient not taking: Reported on 07/06/2020) 90 capsule 2  . predniSONE (DELTASONE) 20 MG tablet Take 1 tablet (20 mg total) by mouth daily for 5 days. 5 tablet 0  . sacubitril-valsartan (ENTRESTO) 24-26 MG Take 1 tablet by mouth 2 (two) times daily. 60 tablet 11  . VENTOLIN HFA 108 (90 Base) MCG/ACT inhaler Inhale 2  puffs into the lungs every 6 (six) hours as needed for wheezing or shortness of breath. 18 g 3   No current facility-administered medications for this visit.     Past Surgical History:  Procedure Laterality Date  . CARDIOVERSION     10/2017  . CHOLECYSTECTOMY    . 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 CLEFT PALATE REPAIR     several from childhood til 74 years old  . COLONOSCOPY  04/28/2011   Procedure: COLONOSCOPY;  Surgeon: Rogene Houston, MD;  Location: [facility];  Service: Endoscopy;  Laterality: N/A;  . COLONOSCOPY N/A 07/18/2014   Procedure: COLONOSCOPY;  Surgeon: Rogene Houston, MD;  Location: [facility];  Service: Endoscopy;  Laterality: N/A;  830  . COLONOSCOPY N/A 09/09/2017   Procedure: COLONOSCOPY;  Surgeon: Rogene Houston, MD;  Location: [facility];  Service: Endoscopy;  Laterality: N/A;  955  . CYSTOSCOPY/URETEROSCOPY/HOLMIUM LASER/STENT PLACEMENT Bilateral 12/06/2017   Procedure: CYSTOSCOPY/RETROGRADE/URETEROSCOPY/HOLMIUM LASER/STENT EXCHANGE;  Surgeon: Festus Aloe, MD;  Location: [facility];  Service: Urology;  Laterality: Bilateral;  ONLY NEEDS 60 MIN  . HERNIA REPAIR    . POLYPECTOMY  09/09/2017   Procedure: POLYPECTOMY;  Surgeon: Rogene Houston, MD;  Location: [facility];  Service: Endoscopy;;  colon  . vocal cord surgery       Allergies  Allergen Reactions  . Augmentin [Amoxicillin-Pot Clavulanate] Other (See Comments)    Increased liver enzymes Has patient had a PCN reaction causing immediate rash, facial/tongue/throat swelling, SOB or lightheadedness with hypotension: No Has patient had a PCN reaction causing severe rash involving mucus membranes or skin necrosis: No Has patient had a PCN reaction that required hospitalization: No Has patient had a PCN reaction occurring within the last 10 years: No  If all of the above answers are "NO", then may proceed with Cephalosporin use.   Marland Kitchen Flomax [Tamsulosin Hcl] Nausea  Only    Dizziness  Pt is on flomax      Family History  Problem Relation Age of Onset  . Breast cancer Mother   . Prostate cancer Father   . COPD Sister      Social History Darius Smith reports that he has never smoked. He has never used smokeless tobacco. Mr. Kozlov reports previous alcohol use of about 7.0 standard drinks of alcohol per week.   Review of Systems CONSTITUTIONAL: No weight loss, fever, chills, weakness or fatigue.  HEENT: Eyes: No visual loss, blurred vision, double vision or yellow sclerae.No hearing loss, sneezing, congestion, runny nose or sore throat.  SKIN: No rash or itching.  CARDIOVASCULAR: per hpi RESPIRATORY: No shortness of breath, cough or sputum.  GASTROINTESTINAL: No anorexia, nausea, vomiting or diarrhea. No abdominal pain or blood.  GENITOURINARY: No burning on urination, no polyuria NEUROLOGICAL: No headache, dizziness, syncope, paralysis, ataxia, numbness or tingling in the extremities. No change in bowel or bladder control.  MUSCULOSKELETAL: No muscle, back pain, joint pain or stiffness.  LYMPHATICS: No enlarged nodes. No history of splenectomy.  PSYCHIATRIC: No history of depression or anxiety.  ENDOCRINOLOGIC: No reports of sweating, cold or heat intolerance. No polyuria or polydipsia.  Marland Kitchen   Physical Examination Today's Vitals   07/11/20 1418  BP: (!) 152/70  Pulse: (!) 131  Resp: 16  SpO2: 97%  Weight: 206 lb 12.8 oz (93.8 kg)  Height: 6' (1.829 m)   Body mass index is 28.05 kg/m.  Gen: resting comfortably, no acute distress HEENT: no scleral icterus, pupils equal round and reactive, no palptable cervical adenopathy,  CV: irreg, tachy, no mr/g, no jvd Resp: Clear to auscultation bilaterally GI: abdomen is soft, non-tender, non-distended, normal bowel sounds, no hepatosplenomegaly MSK: extremities are warm, no edema.  Skin: warm, no rash Neuro:  no focal deficits Psych: appropriate affect   Diagnostic Studies COMPARISON: CT  urogram from April 26, 2020    FINDINGS:  Cardiovascular: Marked cardiac enlargement, four-chamber enlargement  but with more pronounced enlargement of LEFT atrium and RIGHT heart.  Marked LEFT atrial enlargement associated with enlargement of LEFT  atrial appendage with low attenuation in the LEFT atrial appendage.    Calcified coronary artery disease. No pericardial effusion. The  central pulmonary vasculature is engorged approximately 3 cm.    Mediastinum/Nodes: Thoracic inlet structures are unremarkable.    Bulky LEFT axillary lymph node or mass measuring 5.2 x 5.0 cm  displaying low attenuation on this early venous phase assessment,  central density approximately 60 Hounsfield units however compatible  with soft tissue density.    Small nodules in the subcutaneous fat along the LEFT anterior chest  wall (image 59, series 2) approximately 1 cm small nodule.    Lungs/Pleura: Small pulmonary nodule at the lung base on the RIGHT  (image 93, series 3)    Small nodule at the LEFT lung base (image 102, series 3) numerous  other small nodules scattered throughout the lung bases  predominantly but with a RIGHT upper lobe nodule on image 46 of  series 3 measuring 9 x 5 mm. No consolidation or sign of pleural  effusion. Additional nodules in the LEFT upper lobe, largest  approximately 5 mm (image 33,    Upper Abdomen: Post cholecystectomy. Area of hypodensity in the  cephalad aspect of the LEFT hemi liver not as well seen as on the  previous noncontrast CT due to phase of contrast enhancement subtle  area seen on today's exam. No acute process in the upper abdomen.  Nodular appearing adrenals. Presumed LEFT renal cyst is partially  imaged.    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 Other areas of metastatic  disease better evaluated on recent spinal MRI    IMPRESSION:  1. Mass or adenopathy in the LEFT  axilla suspicious for metastasis.  2. Marked cardiac and LEFT atrial enlargement with low attenuation  in the LEFT atrial appendage. Thrombus versus mixing artifact in  this area. Consider echocardiography for further evaluation.  3. Multiple small pulmonary nodules suspicious for metastatic  disease in this patient with signs of spinal metastases and and  nodal or soft tissue involvement in the LEFT axilla.  4. Area of hypodensity in the cephalad aspect of the LEFT hemi liver  not as  well seen as on the previous noncontrast CT due to phase of  contrast enhancement however subtle area seen on today's exam.  Contrasted imaging of the abdomen and pelvis or PET evaluation may  be helpful for further assessment if needed for staging.  5. Destructive lesion in T11 is unchanged. Other areas of metastatic  disease better evaluated on recent spinal MRI.  6. Calcified coronary artery disease.  7. Aortic atherosclerosis.     Assessment and Plan  1. Chronic systolic HF - echo with LVEF 20-25%, new findings for patient - no recent symptoms - we will increase toprol to 50mg  daily - nursing visit 1 weeks, titrate toprol further - repeat echo 3 months, if ongoing dysfunction would plan for cath. Could be tachy mediated. New diagnosis of stage IV lymphoma appears does nto require treatment at this time, touch base with onc on prognosis to consider potential future cath.   2. Afib - his dynamap rates correlate poorly EKG rates, needs repeat EKG at f/u - rate elevated today 130s, recently course of prednisone for sinusitis - we will increase toprol to 50mg  daily. Change that his LV dysfunction is tachymediated, needs aggressive rate controll   Nursing visit 1 week for vitals and EKG check, likely increase toprol to 100mg  daily at that time.       Arnoldo Lenis, M.D.

## 2020-07-11 NOTE — Patient Instructions (Signed)
Medication Instructions:  Your physician recommends that you continue on your current medications as directed. Please refer to the Current Medication list given to you today.  Increase Toprol XL to 50 mg Daily   *If you need a refill on your cardiac medications before your next appointment, please call your pharmacy*   Lab Work: NONE   If you have labs (blood work) drawn today and your tests are completely normal, you will receive your results only by:  [place] (if you have MyChart) OR  A paper copy in the mail If you have any lab test that is abnormal or we need to change your treatment, we will call you to review the results.   Testing/Procedures: NONE   Follow-Up: At [facility], you and your health needs are our priority.  As part of our continuing mission to provide you with exceptional heart care, we have created designated Provider Care Teams.  These Care Teams include your primary Cardiologist (physician) and Advanced Practice Providers (APPs -  Physician Assistants and Nurse Practitioners) who all work together to provide you with the care you need, when you need it.  We recommend signing up for the patient portal called "MyChart".  Sign up information is provided on this After Visit Summary.  MyChart is used to connect with patients for Virtual Visits (Telemedicine).  Patients are able to view lab/test results, encounter notes, upcoming appointments, etc.  Non-urgent messages can be sent to your provider as well.   To learn more about what you can do with MyChart, go to [email or web address].    Your next appointment:   1 week(s)  The format for your next appointment:   In Person  Provider:   Nurse   Other Instructions Thank you for choosing [place]!

## 2020-07-11 NOTE — Addendum Note (Signed)
Addended by: Levonne Hubert on: 07/11/2020 03:00 PM   Modules accepted: Orders

## 2020-07-14 ENCOUNTER — Encounter: Payer: Self-pay | Admitting: Family Medicine

## 2020-07-14 ENCOUNTER — Emergency Department (HOSPITAL_COMMUNITY): Payer: Medicare Other

## 2020-07-14 ENCOUNTER — Other Ambulatory Visit: Payer: Self-pay

## 2020-07-14 ENCOUNTER — Encounter (HOSPITAL_COMMUNITY): Payer: Self-pay

## 2020-07-14 ENCOUNTER — Inpatient Hospital Stay (HOSPITAL_COMMUNITY)
Admission: EM | Admit: 2020-07-14 | Discharge: 2020-07-18 | DRG: 308 | Disposition: A | Discharge status: Home / Self Care | Payer: Medicare Other | Attending: Internal Medicine | Admitting: Internal Medicine

## 2020-07-14 DIAGNOSIS — C8290 Follicular lymphoma, unspecified, unspecified site: Secondary | ICD-10-CM | POA: Diagnosis not present

## 2020-07-14 DIAGNOSIS — I10 Essential (primary) hypertension: Secondary | ICD-10-CM | POA: Diagnosis not present

## 2020-07-14 DIAGNOSIS — I5023 Acute on chronic systolic (congestive) heart failure: Secondary | ICD-10-CM | POA: Diagnosis not present

## 2020-07-14 DIAGNOSIS — Z20822 Contact with and (suspected) exposure to covid-19: Secondary | ICD-10-CM | POA: Diagnosis present

## 2020-07-14 DIAGNOSIS — Z7901 Long term (current) use of anticoagulants: Secondary | ICD-10-CM

## 2020-07-14 DIAGNOSIS — K219 Gastro-esophageal reflux disease without esophagitis: Secondary | ICD-10-CM | POA: Diagnosis not present

## 2020-07-14 DIAGNOSIS — J9601 Acute respiratory failure with hypoxia: Secondary | ICD-10-CM | POA: Diagnosis present

## 2020-07-14 DIAGNOSIS — Z79899 Other long term (current) drug therapy: Secondary | ICD-10-CM

## 2020-07-14 DIAGNOSIS — T501X5A Adverse effect of loop [high-ceiling] diuretics, initial encounter: Secondary | ICD-10-CM | POA: Diagnosis not present

## 2020-07-14 DIAGNOSIS — N1832 Chronic kidney disease, stage 3b: Secondary | ICD-10-CM | POA: Diagnosis present

## 2020-07-14 DIAGNOSIS — I4819 Other persistent atrial fibrillation: Principal | ICD-10-CM | POA: Diagnosis present

## 2020-07-14 DIAGNOSIS — C8590 Non-Hodgkin lymphoma, unspecified, unspecified site: Secondary | ICD-10-CM | POA: Diagnosis present

## 2020-07-14 DIAGNOSIS — J9811 Atelectasis: Secondary | ICD-10-CM | POA: Diagnosis not present

## 2020-07-14 DIAGNOSIS — C8288 Other types of follicular lymphoma, lymph nodes of multiple sites: Secondary | ICD-10-CM | POA: Diagnosis present

## 2020-07-14 DIAGNOSIS — J449 Chronic obstructive pulmonary disease, unspecified: Secondary | ICD-10-CM | POA: Diagnosis not present

## 2020-07-14 DIAGNOSIS — I4891 Unspecified atrial fibrillation: Secondary | ICD-10-CM | POA: Diagnosis not present

## 2020-07-14 DIAGNOSIS — R059 Cough, unspecified: Secondary | ICD-10-CM | POA: Diagnosis not present

## 2020-07-14 DIAGNOSIS — N179 Acute kidney failure, unspecified: Secondary | ICD-10-CM | POA: Diagnosis present

## 2020-07-14 DIAGNOSIS — I5042 Chronic combined systolic (congestive) and diastolic (congestive) heart failure: Secondary | ICD-10-CM

## 2020-07-14 DIAGNOSIS — R0602 Shortness of breath: Secondary | ICD-10-CM | POA: Diagnosis not present

## 2020-07-14 DIAGNOSIS — I509 Heart failure, unspecified: Secondary | ICD-10-CM | POA: Insufficient documentation

## 2020-07-14 DIAGNOSIS — I130 Hypertensive heart and chronic kidney disease with heart failure and stage 1 through stage 4 chronic kidney disease, or unspecified chronic kidney disease: Secondary | ICD-10-CM | POA: Diagnosis present

## 2020-07-14 DIAGNOSIS — I110 Hypertensive heart disease with heart failure: Secondary | ICD-10-CM | POA: Diagnosis not present

## 2020-07-14 DIAGNOSIS — C8258 Diffuse follicle center lymphoma, lymph nodes of multiple sites: Secondary | ICD-10-CM | POA: Diagnosis not present

## 2020-07-14 DIAGNOSIS — I517 Cardiomegaly: Secondary | ICD-10-CM | POA: Diagnosis not present

## 2020-07-14 DIAGNOSIS — N183 Chronic kidney disease, stage 3 unspecified: Secondary | ICD-10-CM | POA: Diagnosis not present

## 2020-07-14 DIAGNOSIS — I5022 Chronic systolic (congestive) heart failure: Secondary | ICD-10-CM

## 2020-07-14 HISTORY — DX: Unspecified atrial fibrillation: I48.91

## 2020-07-14 LAB — CBC WITH DIFFERENTIAL/PLATELET
Abs Immature Granulocytes: 0.03 10*3/uL (ref 0.00–0.07)
Basophils Absolute: 0 10*3/uL (ref 0.0–0.1)
Basophils Relative: 0 %
Eosinophils Absolute: 0.1 10*3/uL (ref 0.0–0.5)
Eosinophils Relative: 1 %
HCT: 45.2 % (ref 39.0–52.0)
Hemoglobin: 13.8 g/dL (ref 13.0–17.0)
Immature Granulocytes: 0 %
Lymphocytes Relative: 12 %
Lymphs Abs: 1.2 10*3/uL (ref 0.7–4.0)
MCH: 27.9 pg (ref 26.0–34.0)
MCHC: 30.5 g/dL (ref 30.0–36.0)
MCV: 91.3 fL (ref 80.0–100.0)
Monocytes Absolute: 0.8 10*3/uL (ref 0.1–1.0)
Monocytes Relative: 9 %
Neutro Abs: 7.3 10*3/uL (ref 1.7–7.7)
Neutrophils Relative %: 78 %
Platelets: 300 10*3/uL (ref 150–400)
RBC: 4.95 MIL/uL (ref 4.22–5.81)
RDW: 15.9 % — ABNORMAL HIGH (ref 11.5–15.5)
WBC: 9.4 10*3/uL (ref 4.0–10.5)
nRBC: 0 % (ref 0.0–0.2)

## 2020-07-14 LAB — COMPREHENSIVE METABOLIC PANEL
ALT: 104 U/L — ABNORMAL HIGH (ref 0–44)
AST: 53 U/L — ABNORMAL HIGH (ref 15–41)
Albumin: 3.1 g/dL — ABNORMAL LOW (ref 3.5–5.0)
Alkaline Phosphatase: 53 U/L (ref 38–126)
Anion gap: 9 (ref 5–15)
BUN: 36 mg/dL — ABNORMAL HIGH (ref 8–23)
CO2: 21 mmol/L — ABNORMAL LOW (ref 22–32)
Calcium: 8.5 mg/dL — ABNORMAL LOW (ref 8.9–10.3)
Chloride: 107 mmol/L (ref 98–111)
Creatinine, Ser: 1.64 mg/dL — ABNORMAL HIGH (ref 0.61–1.24)
GFR, Estimated: 44 mL/min — ABNORMAL LOW (ref >60)
Glucose, Bld: 146 mg/dL — ABNORMAL HIGH (ref 70–99)
Potassium: 4.3 mmol/L (ref 3.5–5.1)
Sodium: 137 mmol/L (ref 135–145)
Total Bilirubin: 2 mg/dL — ABNORMAL HIGH (ref 0.3–1.2)
Total Protein: 6.2 g/dL — ABNORMAL LOW (ref 6.5–8.1)

## 2020-07-14 LAB — TROPONIN I (HIGH SENSITIVITY): Troponin I (High Sensitivity): 24 ng/L — ABNORMAL HIGH (ref <18)

## 2020-07-14 LAB — BRAIN NATRIURETIC PEPTIDE: B Natriuretic Peptide: 2694 pg/mL — ABNORMAL HIGH (ref 0.0–100.0)

## 2020-07-14 LAB — RESPIRATORY PANEL BY RT PCR (FLU A&B, COVID)
Influenza A by PCR: NEGATIVE
Influenza B by PCR: NEGATIVE
SARS Coronavirus 2 by RT PCR: NEGATIVE

## 2020-07-14 LAB — PROTIME-INR
INR: 2 — ABNORMAL HIGH (ref 0.8–1.2)
Prothrombin Time: 21.9 seconds — ABNORMAL HIGH (ref 11.4–15.2)

## 2020-07-14 LAB — MRSA PCR SCREENING: MRSA by PCR: NEGATIVE

## 2020-07-14 LAB — GLUCOSE, CAPILLARY: Glucose-Capillary: 140 mg/dL — ABNORMAL HIGH (ref 70–99)

## 2020-07-14 MED ORDER — DILTIAZEM HCL-DEXTROSE 125-5 MG/125ML-% IV SOLN (PREMIX)
5.0000 mg/h | INTRAVENOUS | Status: DC
  Administered 2020-07-14: 5 mg/h via INTRAVENOUS
  Filled 2020-07-14: qty 125

## 2020-07-14 MED ORDER — ONDANSETRON HCL 4 MG/2ML IJ SOLN: 4.0000 mg | Freq: Four times a day (QID) | INTRAMUSCULAR | Status: DC | PRN

## 2020-07-14 MED ORDER — FUROSEMIDE 10 MG/ML IJ SOLN
40.0000 mg | Freq: Once | INTRAMUSCULAR | Status: AC
  Administered 2020-07-14: 40 mg via INTRAVENOUS
  Filled 2020-07-14: qty 4

## 2020-07-14 MED ORDER — SODIUM CHLORIDE 0.9 % IV SOLN: 250.0000 mL | INTRAVENOUS | Status: DC | PRN

## 2020-07-14 MED ORDER — METOPROLOL SUCCINATE ER 50 MG PO TB24
50.0000 mg | ORAL_TABLET | Freq: Every day | ORAL | Status: DC
  Administered 2020-07-15 – 2020-07-16 (×2): 50 mg via ORAL
  Filled 2020-07-14 (×2): qty 1

## 2020-07-14 MED ORDER — DILTIAZEM LOAD VIA INFUSION
20.0000 mg | Freq: Once | INTRAVENOUS | Status: AC
  Administered 2020-07-14: 20 mg via INTRAVENOUS
  Filled 2020-07-14: qty 20

## 2020-07-14 MED ORDER — AMIODARONE LOAD VIA INFUSION
150.0000 mg | Freq: Once | INTRAVENOUS | Status: AC
  Administered 2020-07-14: 150 mg via INTRAVENOUS
  Filled 2020-07-14: qty 83.34

## 2020-07-14 MED ORDER — SACUBITRIL-VALSARTAN 24-26 MG PO TABS
1.0000 | ORAL_TABLET | Freq: Two times a day (BID) | ORAL | Status: DC
  Administered 2020-07-14: 1 via ORAL
  Filled 2020-07-14: qty 1

## 2020-07-14 MED ORDER — AMIODARONE HCL IN DEXTROSE 360-4.14 MG/200ML-% IV SOLN
30.0000 mg/h | INTRAVENOUS | Status: DC
  Administered 2020-07-15 – 2020-07-16 (×5): 30 mg/h via INTRAVENOUS
  Filled 2020-07-14 (×13): qty 200

## 2020-07-14 MED ORDER — SODIUM CHLORIDE 0.9% FLUSH: 3.0000 mL | INTRAVENOUS | Status: DC | PRN

## 2020-07-14 MED ORDER — FUROSEMIDE 10 MG/ML IJ SOLN
40.0000 mg | INTRAMUSCULAR | Status: AC
  Administered 2020-07-14: 40 mg via INTRAVENOUS
  Filled 2020-07-14: qty 4

## 2020-07-14 MED ORDER — PANTOPRAZOLE SODIUM 40 MG PO TBEC
40.0000 mg | DELAYED_RELEASE_TABLET | Freq: Every day | ORAL | Status: DC
  Administered 2020-07-14 – 2020-07-18 (×5): 40 mg via ORAL
  Filled 2020-07-14 (×5): qty 1

## 2020-07-14 MED ORDER — ACETAMINOPHEN 325 MG PO TABS: 650.0000 mg | ORAL_TABLET | ORAL | Status: DC | PRN

## 2020-07-14 MED ORDER — AMIODARONE HCL IN DEXTROSE 360-4.14 MG/200ML-% IV SOLN
60.0000 mg/h | INTRAVENOUS | Status: DC
  Administered 2020-07-14 (×2): 60 mg/h via INTRAVENOUS
  Filled 2020-07-14 (×2): qty 200

## 2020-07-14 MED ORDER — APIXABAN 5 MG PO TABS
5.0000 mg | ORAL_TABLET | Freq: Two times a day (BID) | ORAL | Status: DC
  Administered 2020-07-14 – 2020-07-18 (×8): 5 mg via ORAL
  Filled 2020-07-14 (×8): qty 1

## 2020-07-14 MED ORDER — FUROSEMIDE 10 MG/ML IJ SOLN: 40.0000 mg | Freq: Every day | INTRAMUSCULAR | Status: DC

## 2020-07-14 MED ORDER — CHLORHEXIDINE GLUCONATE CLOTH 2 % EX PADS
6.0000 | MEDICATED_PAD | Freq: Every day | CUTANEOUS | Status: DC
  Administered 2020-07-14 – 2020-07-18 (×5): 6 via TOPICAL

## 2020-07-14 MED ORDER — SODIUM CHLORIDE 0.9% FLUSH
3.0000 mL | Freq: Two times a day (BID) | INTRAVENOUS | Status: DC
  Administered 2020-07-16 – 2020-07-18 (×4): 3 mL via INTRAVENOUS

## 2020-07-14 MED ORDER — LORATADINE 10 MG PO TABS
10.0000 mg | ORAL_TABLET | Freq: Every day | ORAL | Status: DC
  Administered 2020-07-17: 10 mg via ORAL
  Filled 2020-07-14 (×4): qty 1

## 2020-07-14 MED ORDER — BENZONATATE 100 MG PO CAPS
200.0000 mg | ORAL_CAPSULE | Freq: Three times a day (TID) | ORAL | Status: DC
  Administered 2020-07-14 – 2020-07-18 (×12): 200 mg via ORAL
  Filled 2020-07-14 (×12): qty 2

## 2020-07-14 MED ORDER — AMIODARONE HCL 150 MG/3ML IV SOLN
INTRAVENOUS | Status: AC
  Filled 2020-07-14: qty 3

## 2020-07-14 NOTE — H&P (Signed)
History and Physical  Darius Smith DJM:[id number] DOB: Sep 02, 1946 DOA: 07/14/2020   PCP: Perlie Mayo, NP   Patient coming from: Home  Chief Complaint: dyspnea  HPI:  Darius Smith is a 74 y.o. male with medical history of paroxysmal atrial fibrillation on apixaban, COPD, systolic CHF, follicular lymphoma, hypertension presenting with shortness of breath for the past 2 weeks.  The patient has also been complaining of nonproductive cough and chest congestion.  He came to the emergency department on 07/06/2020 with sore throat, nonproductive cough and nasal congestion.  He was discharged home in stable condition with prednisone.  He states that his breathing did not improve very much.  He continued to have nonproductive cough.  He also describes orthopnea type symptoms.  He denies any worsening lower extremity edema or increasing abdominal girth.  The patient went to see his cardiologist, Dr. Harl Bowie on 07/11/2020.  At that time, the patient was noted to have atrial fibrillation with RVR.  His metoprolol succinate was increased to 50 mg daily.  He was continued on his Entresto.  However, the patient continued to have shortness of breath over the weekend with coughing and orthopnea.  As result, he presented for further evaluation. He denies any fevers, chills, chest pain, headache, nausea, vomiting, diarrhea, abdominal pain, dysuria, hematuria. In the emergency department, the patient was afebrile hemodynamically stable with oxygen saturation 89-93% on room air.  BMP showed a serum creatinine of 1.64.  AST 53, ALT 104, alkaline phosphatase 53, total bilirubin 2.0.  WBC 9.1, hemoglobin 13.8, platelets 360,000.  Chest x-ray showed increased interstitial markings and right lower lobe opacity.  The patient was initially started on diltiazem drip, but this was transitioned to an amiodarone drip given to his low EF.  Cardiology was consulted to assist with management.  Assessment/Plan: Acute on chronic  systolic CHF -Daily weight -felt to be tachyarrthymia relasted -Continue IV furosemide -A.m. BMP -Accurate I's and O's -06/12/2020 echo EF 20-25%, global HK, mild decreased RV function, trivial MR -Continue Entresto -cardiology consult  Atrial fibrillation with RVR--paroxysmal -Continue amiodarone drip -07/06/2020 TSH 1.242 -Continue apixaban -May need cardioversion -Discontinue diltiazem -Continue metoprolol succinate  CKD stage IIIb -Baseline creatinine 1.4-1.6 -Monitor with diuresis  Stage IVa follicular lymphoma -Patient follows with medical oncology, Dr. Delton Coombes -Currently under observation -06/23/2020 PET scan shows hypermetabolic lymph nodes in the neck, axillary region, and small bowel mesentery nodes.  Hypermetabolic lesions in the spine, pelvis, and bilateral hips  Essential hypertension -Continue metoprolol succinate       Past Medical History:  Diagnosis Date  . Arthritis    both hands  . Atrial fibrillation ([employer]) 10/28/2017   Dr. Harl Bowie  . Blood in urine    occ  . Bronchitis   . Cancer ([facility])    Basal cell  . Cardiomegaly 10/28/2017  . Complication of anesthesia    Mr. Heal developed a. fib in recovery after cysto procedure 10/2017 was transferred to [facility] and underwent successful cardioversion  . Diverticulosis of sigmoid colon 08/2017   Noted on colonoscopy  . Dysrhythmia   . External hemorrhoids 08/2017  . GERD (gastroesophageal reflux disease)   . History of colon polyps 08/2017  . History of kidney stones   . History of rectal bleeding   . History of right inguinal hernia   . Hypertension   . Nephrolithiasis 07/02/2019  . Rectal bleeding 09/02/2017   Added automatically from request for surgery [phone]  . Shortness of breath  Past Surgical History:  Procedure Laterality Date  . CARDIOVERSION     10/2017  . CHOLECYSTECTOMY    . 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 CLEFT PALATE REPAIR     several from  childhood til 74 years old  . COLONOSCOPY  04/28/2011   Procedure: COLONOSCOPY;  Surgeon: Rogene Houston, MD;  Location: [facility];  Service: Endoscopy;  Laterality: N/A;  . COLONOSCOPY N/A 07/18/2014   Procedure: COLONOSCOPY;  Surgeon: Rogene Houston, MD;  Location: [facility];  Service: Endoscopy;  Laterality: N/A;  830  . COLONOSCOPY N/A 09/09/2017   Procedure: COLONOSCOPY;  Surgeon: Rogene Houston, MD;  Location: [facility];  Service: Endoscopy;  Laterality: N/A;  955  . CYSTOSCOPY/URETEROSCOPY/HOLMIUM LASER/STENT PLACEMENT Bilateral 12/06/2017   Procedure: CYSTOSCOPY/RETROGRADE/URETEROSCOPY/HOLMIUM LASER/STENT EXCHANGE;  Surgeon: Festus Aloe, MD;  Location: [facility];  Service: Urology;  Laterality: Bilateral;  ONLY NEEDS 60 MIN  . HERNIA REPAIR    . POLYPECTOMY  09/09/2017   Procedure: POLYPECTOMY;  Surgeon: Rogene Houston, MD;  Location: [facility];  Service: Endoscopy;;  colon  . vocal cord surgery     Social History:  reports that he has never smoked. He has never used smokeless tobacco. He reports previous alcohol use of about 7.0 standard drinks of alcohol per week. He reports that he does not use drugs.   Family History  Problem Relation Age of Onset  . Breast cancer Mother   . Prostate cancer Father   . COPD Sister      Allergies  Allergen Reactions  . Augmentin [Amoxicillin-Pot Clavulanate] Other (See Comments)    Increased liver enzymes Has patient had a PCN reaction causing immediate rash, facial/tongue/throat swelling, SOB or lightheadedness with hypotension: No Has patient had a PCN reaction causing severe rash involving mucus membranes or skin necrosis: No Has patient had a PCN reaction that required hospitalization: No Has patient had a PCN reaction occurring within the last 10 years: No  If all of the above answers are "NO", then may proceed with Cephalosporin use.   Marland Kitchen Flomax [Tamsulosin Hcl] Nausea Only    Dizziness  Pt is on flomax      Prior to Admission medications   Medication Sig Start Date End Date Taking? Authorizing Provider  acetaminophen (TYLENOL) 500 MG tablet Take 1,000 mg by mouth every 6 (six) hours as needed for moderate pain or headache.   Yes [provider]  cetirizine (ZYRTEC) 10 MG tablet Take 1 tablet (10 mg total) by mouth daily as needed for allergies. 01/01/20  Yes Corum, Rex Kras, MD  ELIQUIS 5 MG TABS tablet TAKE ONE TABLET BY MOUTH 2 TIMES A DAY. Patient taking differently: Take 5 mg by mouth 2 (two) times daily.  04/21/20  Yes BranchAlphonse Guild, MD  metoprolol succinate (TOPROL-XL) 50 MG 24 hr tablet Take 1 tablet (50 mg total) by mouth daily. Take with or immediately following a meal. 07/11/20 10/09/20 Yes Branch, Alphonse Guild, MD  omeprazole (PRILOSEC) 20 MG capsule Take 1 capsule (20 mg total) by mouth daily as needed (for heartburn). 01/01/20  Yes Corum, Rex Kras, MD  sacubitril-valsartan (ENTRESTO) 24-26 MG Take 1 tablet by mouth 2 (two) times daily. 06/23/20  Yes Branch, Alphonse Guild, MD  VENTOLIN HFA 108 [phone] Base) MCG/ACT inhaler Inhale 2 puffs into the lungs every 6 (six) hours as needed for wheezing or shortness of breath. 07/11/20  Yes Simpson,  Norwood Levo, MD  furosemide (LASIX) 20 MG tablet Take 20 mg by mouth daily as needed for leg swelling 06/23/20   Arnoldo Lenis, MD    Review of Systems:  Constitutional:  No weight loss, night sweats, Fevers, chills, fatigue.  Head&Eyes: No headache.  No vision loss.  No eye pain or scotoma ENT:  No Difficulty swallowing,Tooth/dental problems,Sore throat,  No ear ache, post nasal drip,  Cardio-vascular:  No chest pain, Orthopnea, PND, swelling in lower extremities,  dizziness, palpitations  GI:  No  abdominal pain, nausea, vomiting, diarrhea, loss of appetite, hematochezia, melena, heartburn, indigestion, Resp:   No coughing up of blood .No wheezing.No chest wall deformity  Skin:  no rash or lesions.  GU:  no dysuria, change in color  of urine, no urgency or frequency. No flank pain.  Musculoskeletal:  No joint pain or swelling. No decreased range of motion. No back pain.  Psych:  No change in mood or affect. No depression or anxiety. Neurologic: No headache, no dysesthesia, no focal weakness, no vision loss. No syncope  Physical Exam: Vitals:   07/14/20 1130 07/14/20 1200 07/14/20 1230 07/14/20 1300  BP: (!) 127/94 (!) 111/94 (!) 129/108 (!) 126/95  Pulse: 71 (!) 30  63  Resp: 18 (!) 25 (!) 31 (!) 25  Temp:      TempSrc:      SpO2: 93% 93% (!) 86% 92%  Weight:      Height:       General:  A&O x 3, NAD, nontoxic, pleasant/cooperative Head/Eye: No conjunctival hemorrhage, no icterus, [place]/AT, No nystagmus ENT:  No icterus,  No thrush, good dentition, no pharyngeal exudate Neck:  No masses, no lymphadenpathy, no bruits CV:  iRRR, no rub, no gallop, no S3+JVD Lung: Bibasilar crackles but no wheezing.  Good air movement Abdomen: soft/NT, +BS, nondistended, no peritoneal signs Ext: No cyanosis, No rashes, No petechiae, No lymphangitis, trace edema Neuro: CNII-XII intact, strength 4/5 in bilateral upper and lower extremities, no dysmetria  Labs on Admission:  Basic Metabolic Panel: Recent Labs  Lab 07/14/20 1031  NA 137  K 4.3  CL 107  CO2 21*  GLUCOSE 146*  BUN 36*  CREATININE 1.64*  CALCIUM 8.5*   Liver Function Tests: Recent Labs  Lab 07/14/20 1031  AST 53*  ALT 104*  ALKPHOS 53  BILITOT 2.0*  PROT 6.2*  ALBUMIN 3.1*   No results for input(s): LIPASE, AMYLASE in the last 168 hours. No results for input(s): AMMONIA in the last 168 hours. CBC: Recent Labs  Lab 07/14/20 1031  WBC 9.4  NEUTROABS 7.3  HGB 13.8  HCT 45.2  MCV 91.3  PLT 300   Coagulation Profile: Recent Labs  Lab 07/14/20 1031  INR 2.0*   Cardiac Enzymes: No results for input(s): CKTOTAL, CKMB, CKMBINDEX, TROPONINI in the last 168 hours. BNP: Invalid input(s): POCBNP CBG: No results for input(s): GLUCAP in the  last 168 hours. Urine analysis:    Component Value Date/Time   COLORURINE YELLOW 09/22/2018 1009   APPEARANCEUR Clear 06/10/2020 0851   LABSPEC 1.012 09/22/2018 1009   PHURINE 6.0 09/22/2018 1009   GLUCOSEU Negative 06/10/2020 0851   HGBUR SMALL (A) 09/22/2018 1009   BILIRUBINUR Negative 06/10/2020 0851   KETONESUR trace (5) (A) 04/09/2020 1050   KETONESUR NEGATIVE 09/22/2018 1009   PROTEINUR 1+ (A) 06/10/2020 0851   PROTEINUR NEGATIVE 09/22/2018 1009   UROBILINOGEN 2.0 (A) 04/09/2020 1050   NITRITE Negative 06/10/2020 0851   NITRITE NEGATIVE  09/22/2018 1009   LEUKOCYTESUR Negative 06/10/2020 0851   Sepsis Labs: @LABRCNTIP (procalcitonin:4,lacticidven:4) ) Recent Results (from the past 240 hour(s))  Respiratory Panel by RT PCR (Flu A&B, Covid) - Nasopharyngeal Swab     Status: None   Collection Time: 07/06/20 10:15 AM   Specimen: Nasopharyngeal Swab  Result Value Ref Range Status   SARS Coronavirus 2 by RT PCR NEGATIVE NEGATIVE Final    Comment: (NOTE) SARS-CoV-2 target nucleic acids are NOT DETECTED.  The SARS-CoV-2 RNA is generally detectable in upper respiratoy specimens during the acute phase of infection. The lowest concentration of SARS-CoV-2 viral copies this assay can detect is 131 copies/mL. A negative result does not preclude SARS-Cov-2 infection and should not be used as the sole basis for treatment or other patient management decisions. A negative result may occur with  improper specimen collection/handling, submission of specimen other than nasopharyngeal swab, presence of viral mutation(s) within the areas targeted by this assay, and inadequate number of viral copies (<131 copies/mL). A negative result must be combined with clinical observations, patient history, and epidemiological information. The expected result is Negative.  Fact Sheet for Patients:  PinkCheek.be  Fact Sheet for Healthcare Providers:   GravelBags.it  This test is no t yet approved or cleared by the [place] FDA and  has been authorized for detection and/or diagnosis of SARS-CoV-2 by FDA under an Emergency Use Authorization (EUA). This EUA will remain  in effect (meaning this test can be used) for the duration of the COVID-19 declaration under Section 564(b)(1) of the Act, 21 U.S.C. section 360bbb-3(b)(1), unless the authorization is terminated or revoked sooner.     Influenza A by PCR NEGATIVE NEGATIVE Final   Influenza B by PCR NEGATIVE NEGATIVE Final    Comment: (NOTE) The Xpert Xpress SARS-CoV-2/FLU/RSV assay is intended as an aid in  the diagnosis of influenza from Nasopharyngeal swab specimens and  should not be used as a sole basis for treatment. Nasal washings and  aspirates are unacceptable for Xpert Xpress SARS-CoV-2/FLU/RSV  testing.  Fact Sheet for Patients: PinkCheek.be  Fact Sheet for Healthcare Providers: GravelBags.it  This test is not yet approved or cleared by the [place] FDA and  has been authorized for detection and/or diagnosis of SARS-CoV-2 by  FDA under an Emergency Use Authorization (EUA). This EUA will remain  in effect (meaning this test can be used) for the duration of the  Covid-19 declaration under Section 564(b)(1) of the Act, 21  U.S.C. section 360bbb-3(b)(1), unless the authorization is  terminated or revoked. Performed at [facility], [address], [place], [place] [address]   Respiratory Panel by RT PCR (Flu A&B, Covid) - Nasopharyngeal Swab     Status: None   Collection Time: 07/14/20 11:44 AM   Specimen: Nasopharyngeal Swab  Result Value Ref Range Status   SARS Coronavirus 2 by RT PCR NEGATIVE NEGATIVE Final    Comment: (NOTE) SARS-CoV-2 target nucleic acids are NOT DETECTED.  The SARS-CoV-2 RNA is generally detectable in upper respiratoy specimens during the acute phase  of infection. The lowest concentration of SARS-CoV-2 viral copies this assay can detect is 131 copies/mL. A negative result does not preclude SARS-Cov-2 infection and should not be used as the sole basis for treatment or other patient management decisions. A negative result may occur with  improper specimen collection/handling, submission of specimen other than nasopharyngeal swab, presence of viral mutation(s) within the areas targeted by this assay, and inadequate number of viral copies (<131 copies/mL). A negative  result must be combined with clinical observations, patient history, and epidemiological information. The expected result is Negative.  Fact Sheet for Patients:  PinkCheek.be  Fact Sheet for Healthcare Providers:  GravelBags.it  This test is no t yet approved or cleared by the [place] FDA and  has been authorized for detection and/or diagnosis of SARS-CoV-2 by FDA under an Emergency Use Authorization (EUA). This EUA will remain  in effect (meaning this test can be used) for the duration of the COVID-19 declaration under Section 564(b)(1) of the Act, 21 U.S.C. section 360bbb-3(b)(1), unless the authorization is terminated or revoked sooner.     Influenza A by PCR NEGATIVE NEGATIVE Final   Influenza B by PCR NEGATIVE NEGATIVE Final    Comment: (NOTE) The Xpert Xpress SARS-CoV-2/FLU/RSV assay is intended as an aid in  the diagnosis of influenza from Nasopharyngeal swab specimens and  should not be used as a sole basis for treatment. Nasal washings and  aspirates are unacceptable for Xpert Xpress SARS-CoV-2/FLU/RSV  testing.  Fact Sheet for Patients: PinkCheek.be  Fact Sheet for Healthcare Providers: GravelBags.it  This test is not yet approved or cleared by the [place] FDA and  has been authorized for detection and/or diagnosis of  SARS-CoV-2 by  FDA under an Emergency Use Authorization (EUA). This EUA will remain  in effect (meaning this test can be used) for the duration of the  Covid-19 declaration under Section 564(b)(1) of the Act, 21  U.S.C. section 360bbb-3(b)(1), unless the authorization is  terminated or revoked. Performed at [facility], [address] [place],  [address]      Radiological Exams on Admission: DG Chest [facility] 1 View  Result Date: 07/14/2020 CLINICAL DATA:  Shortness of breath, cough. EXAM: PORTABLE CHEST 1 VIEW COMPARISON:  July 06, 2020. FINDINGS: Stable cardiomegaly. No pneumothorax is noted. Mild bibasilar interstitial densities are noted concerning for edema or subsegmental atelectasis. Bony thorax is unremarkable. IMPRESSION: Mild bibasilar interstitial densities are noted concerning for edema or subsegmental atelectasis. Electronically Signed   By: Marijo Conception M.D.   On: 07/14/2020 11:00    EKG: Independently reviewed. Afib, RVR with nonspecific STT change    Time spent:70 minutes Code Status:   FULL Family Communication:  Spouse updated at bedside 10/25 Disposition Plan: expect 2-3 day hospitalization Consults called: cardiology DVT Prophylaxis: apixaban  Orson Eva, DO  [facility] Pager [phone]  If 7PM-7AM, please contact night-coverage [email or web address] Password [email or web address] 07/14/2020, 1:17 PM

## 2020-07-14 NOTE — Progress Notes (Signed)
Full consult to follow, patient discussed with Er staff well known from clinic. New diagnosis of systolic HF recently, has had ongoing issues with afib with RVR. Last SR EKG was Jan 2020. Would d/c IV diltiazem given low LVEF and acute decompensation, start amiodarone drip. With HF and renal dysfunction overall antiarrhythmic options are limited. Worsening systolic dysfunction may be tachy mediated, needs aggressive arrhythmia control, home beta blocker being titrate but has failed thus far. Would load IV amio, once euvolemic if has not converted consider DCCV   Zandra Abts MD

## 2020-07-14 NOTE — Consult Note (Signed)
Cardiology Consultation:   Patient ID: Darius Smith MRN: [id number]; DOB: Dec 16, 1945  Admit date: 07/14/2020 Date of Consult: 07/14/2020  Primary Care Provider: Perlie Mayo, NP [facility] Cardiologist: Carlyle Dolly, MD  [facility] Electrophysiologist:  None    Patient Profile:   Darius Smith is a 74 y.o. male with a hx of PAF and chronic systolic heart failiure who is being seen today for the evaluation of SOB and tachycardia  at the request of Dr Tat.  History of Present Illness:   Darius Smith 74 yo male history of PAF, new diagnosis of systolic HF 05/8337 with echo LVEF 20-25%, stage IV lymphoma just being monitored at this time given lack of symptoms, HTN presents with SOB and elevated heart rate. Also reports some abdominal distension, orthopnea.    Seen in clinic recently. Recent diagonsis of systolic dysfunction, echo was done due to possible left atrial filling defect by CT, was not symptomatic at that time. Showed LVEF newly decreased to 20-25%. Also as outpatient recent issues with afib with RVR, we had been titrating his beta blocker. There is a chance that his systolic dysfunction may be tachy mediated. We had deferred cath given relatively new diagnosis of lymphoma, and were working of medical therapy of his HF.    WBC 9.4 Hgb 13.8 Plt 300 K 4.2 Cr 1.64 BUN 36 AST 53 ALT 104 T bil 2 BNP 2694 INR 2 hstrop 24--> CXR edema EKG Afib RVR. Past Medical History:  Diagnosis Date  . Arthritis    both hands  . Atrial fibrillation ([place]) 10/28/2017   Dr. Harl Bowie  . Blood in urine    occ  . Bronchitis   . Cancer (HCC)    Basal cell  . Cardiomegaly 10/28/2017  . Complication of anesthesia    Mr. Tigges developed a. fib in recovery after cysto procedure 10/2017 was transferred to [facility] and underwent successful cardioversion  . Diverticulosis of sigmoid colon 08/2017   Noted on colonoscopy  . Dysrhythmia   . External hemorrhoids 08/2017  . GERD (gastroesophageal  reflux disease)   . History of colon polyps 08/2017  . History of kidney stones   . History of rectal bleeding   . History of right inguinal hernia   . Hypertension   . Nephrolithiasis 07/02/2019  . Rectal bleeding 09/02/2017   Added automatically from request for surgery [id number]  . Shortness of breath     Past Surgical History:  Procedure Laterality Date  . CARDIOVERSION     10/2017  . CHOLECYSTECTOMY    . 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 CLEFT PALATE REPAIR     several from childhood til 74 years old  . COLONOSCOPY  04/28/2011   Procedure: COLONOSCOPY;  Surgeon: Rogene Houston, MD;  [facility];  Service: Endoscopy;  Laterality: N/A;  . COLONOSCOPY N/A 07/18/2014   Procedure: COLONOSCOPY;  Surgeon: Rogene Houston, MD;  [facility];  Service: Endoscopy;  Laterality: N/A;  830  . COLONOSCOPY N/A 09/09/2017   Procedure: COLONOSCOPY;  Surgeon: Rogene Houston, MD;  [facility];  Service: Endoscopy;  Laterality: N/A;  955  . CYSTOSCOPY/URETEROSCOPY/HOLMIUM LASER/STENT PLACEMENT Bilateral 12/06/2017   Procedure: CYSTOSCOPY/RETROGRADE/URETEROSCOPY/HOLMIUM LASER/STENT EXCHANGE;  Surgeon: Festus Aloe, MD;  [facility]: [facility];  Service: Urology;  Laterality: Bilateral;  ONLY NEEDS 60 MIN  . HERNIA REPAIR    . POLYPECTOMY  09/09/2017   Procedure:  POLYPECTOMY;  Surgeon: Rogene Houston, MD;  [facility];  Service: Endoscopy;;  colon  . vocal cord surgery       Inpatient Medications: Scheduled Meds: . amiodarone      . amiodarone  150 mg Intravenous Once   Continuous Infusions: . amiodarone     Followed by  . amiodarone     PRN Meds:   Allergies:    Allergies  Allergen Reactions  . Augmentin [Amoxicillin-Pot Clavulanate] Other (See Comments)    Increased liver enzymes Has patient had a PCN reaction causing immediate rash, facial/tongue/throat swelling, SOB or lightheadedness with hypotension:  No Has patient had a PCN reaction causing severe rash involving mucus membranes or skin necrosis: No Has patient had a PCN reaction that required hospitalization: No Has patient had a PCN reaction occurring within the last 10 years: No  If all of the above answers are "NO", then may proceed with Cephalosporin use.   Marland Kitchen Flomax [Tamsulosin Hcl] Nausea Only    Dizziness  Pt is on flomax    Social History:   Social History   Socioeconomic History  . Marital status: Married    Spouse name: Not on file  . Number of children: Not on file  . Years of education: Not on file  . Highest education level: Not on file  Occupational History  . Occupation: retired  Tobacco Use  . Smoking status: Never Smoker  . Smokeless tobacco: Never Used  Vaping Use  . Vaping Use: Never used  Substance and Sexual Activity  . Alcohol use: Not Currently    Alcohol/week: 7.0 standard drinks    Types: 7 Cans of beer per week  . Drug use: No  . Sexual activity: Not on file  Other Topics Concern  . Not on file  Social History Narrative   Lives with wife Darius Smith of 81 years April 15, 2020      2 children, 2 grandchildren   Dog: Gardner Candle      Enjoys: being outdoors       Diet: eats all food groups    Caffeine: drink coffee in the morning, some tea, diet dr pepper   Water: 3-4 cups daily         Wears seat belt    Does not use phone while driving   [place] put up not loaded          Social Determinants of Radio broadcast assistant Strain: Low Risk   . Difficulty of Paying Living Expenses: Not hard at all  Food Insecurity: No Food Insecurity  . Worried About Charity fundraiser in the Last Year: Never true  . Ran Out of Food in the Last Year: Never true  Transportation Needs: No Transportation Needs  . Lack of Transportation (Medical): No  . Lack of Transportation (Non-Medical): No  Physical Activity: Insufficiently Active  . Days of Exercise per Week: 3 days  .  Minutes of Exercise per Session: 30 min  Stress: No Stress Concern Present  . Feeling of Stress : Only a little  Social Connections: Moderately Integrated  . Frequency of Communication with Friends and Family: More than three times a week  . Frequency of Social Gatherings with Friends and Family: More than three times a week  . Attends Religious Services: More than 4 times per year  . Active Member of Clubs or Organizations: No  . Attends Archivist Meetings: Never  .  Marital Status: Married  Human resources officer Violence: Not At Risk  . Fear of Current or Ex-Partner: No  . Emotionally Abused: No  . Physically Abused: No  . Sexually Abused: No    Family History:    Family History  Problem Relation Age of Onset  . Breast cancer Mother   . Prostate cancer Father   . COPD Sister      ROS:  Please see the history of present illness.  All other ROS reviewed and negative.     Physical Exam/Data:   Vitals:   07/14/20 1100 07/14/20 1115 07/14/20 1130 07/14/20 1200  BP: 138/90  (!) 127/94 (!) 111/94  Pulse: 76 (!) 49 71 (!) 30  Resp: (!) 23 (!) 23 18 (!) 25  Temp:      TempSrc:      SpO2: 97% (!) 89% 93% 93%  Weight:      Height:        Intake/Output Summary (Last 24 hours) at 07/14/2020 1230 Last data filed at 07/14/2020 1137 Gross per 24 hour  Intake 10 ml  Output --  Net 10 ml   Last 3 Weights 07/14/2020 07/11/2020 07/06/2020  Weight (lbs) 198 lb 206 lb 12.8 oz 198 lb 6.6 oz  Weight (kg) 89.812 kg 93.804 kg 90 kg     Body mass index is 26.85 kg/m.  General:  Well nourished, well developed, in no acute distress HEENT: normal Lymph: no adenopathy Neck: no JVD Endocrine:  No thryomegaly Vascular: No carotid bruits; FA pulses 2+ bilaterally without bruits  Cardiac: irreg, no m/r/g Lungs:  clear to auscultation bilaterally, no wheezing, rhonchi or rales  Abd: soft, nontender, no hepatomegaly  Ext: no edema Musculoskeletal:  No deformities, BUE and BLE  strength normal and equal Skin: warm and dry  Neuro:  CNs 2-12 intact, no focal abnormalities noted Psych:  Normal affect     Laboratory Data:  High Sensitivity Troponin:   Recent Labs  Lab 07/14/20 1031  TROPONINIHS 24*     Chemistry Recent Labs  Lab 07/14/20 1031  NA 137  K 4.3  CL 107  CO2 21*  GLUCOSE 146*  BUN 36*  CREATININE 1.64*  CALCIUM 8.5*  GFRNONAA 44*  ANIONGAP 9    Recent Labs  Lab 07/14/20 1031  PROT 6.2*  ALBUMIN 3.1*  AST 53*  ALT 104*  ALKPHOS 53  BILITOT 2.0*   Hematology Recent Labs  Lab 07/14/20 1031  WBC 9.4  RBC 4.95  HGB 13.8  HCT 45.2  MCV 91.3  MCH 27.9  MCHC 30.5  RDW 15.9*  PLT 300   BNP Recent Labs  Lab 07/14/20 1031  BNP 2,694.0*    DDimer No results for input(s): DDIMER in the last 168 hours.   Radiology/Studies:  DG Chest Port 1 View  Result Date: 07/14/2020 CLINICAL DATA:  Shortness of breath, cough. EXAM: PORTABLE CHEST 1 VIEW COMPARISON:  July 06, 2020. FINDINGS: Stable cardiomegaly. No pneumothorax is noted. Mild bibasilar interstitial densities are noted concerning for edema or subsegmental atelectasis. Bony thorax is unremarkable. IMPRESSION: Mild bibasilar interstitial densities are noted concerning for edema or subsegmental atelectasis. Electronically Signed   By: Marijo Conception M.D.   On: 07/14/2020 11:00     Assessment and Plan:   1. Acute on chronic systolic HF - initially LV dysfunction noted incidentally on echo 05/2020 done for possible filling defect noted on CT, had not been symptomatic - recent issues with SOB. BNP this admit 2000s, CXR  with edema. He has not developed symptomatic HF.   - continue entresto, continue toprol and titrate further once more euvolemic.  - we have deferred cath given recent diagnosis of stage IV lymphoma.   - received IV lasix 40mg  x 1 in ER, redose later today and reassess dosing tomorrow.   2. PAF - by EKGs was SR Jan 2020 - historically dynamap  inaccurate for his rates, requires EKGs. Also misleading as he is typically asymptomatic with elevated rates.  - given significant dysfunction that may be tachy mediated, difficulty controlling with beta blocker alone, avoiding CCB due to systolic dysfunction, renal function and HF limits antiarrhythmic options we have started amiodarone. With renal function also would be reluctant for digoxin at this time.  - aggressive management of rhythm as inpatient as may have tachy mediated CM, if does not convert with amio alone likely plan for DCCV this admission - continue eliquis. Would load amio 48 hours, if still in afib DCCV. He reports he has not missed any eliquis doses.    3. Stage IV lymphoma - from onc notes just monitoring at this time, in absence of symptoms no indciation for therapy.  {  For questions or updates, please contact [place] Please consult [email or web address] for contact info under    Signed, Carlyle Dolly, MD  07/14/2020 12:30 PM

## 2020-07-14 NOTE — ED Notes (Signed)
Hospitalist at bedside 

## 2020-07-14 NOTE — ED Notes (Signed)
Patient transported upstairs at this time.

## 2020-07-14 NOTE — ED Triage Notes (Signed)
Pt presents to ED with complaints of increased SOB, cough and congestion got worse since Thursday. Denies fever.

## 2020-07-14 NOTE — ED Notes (Signed)
Rate less than 100, continues to be in A-fib. Dr. Harl Bowie notified with orders to proceed with Amiodarone infusion.

## 2020-07-14 NOTE — ED Provider Notes (Signed)
[facility] EMERGENCY DEPARTMENT Provider Note   CSN: [id number] Arrival date & time: 07/14/20  2585     History Chief Complaint  Patient presents with  . Shortness of Breath    Darius Smith is a 74 y.o. male.  HPI   This patient is a 74 year old male, he has a known history of atrial fibrillation currently taking Eliquis Entresto and metoprolol.  He has a history of hypertension and a history of some shortness of breath.  Review of the medical record shows that the patient had been seen approximately 1 week ago for what was thought to be some allergic rhinitis, during which time he had a negative Covid test, a TSH was normal, magnesium was normal, metabolic panel showed renal insufficiency with a creatinine of 1.6 very close to the patient's baseline and a CBC showed no leukocytosis or anemia.  Placed on prednisone, he finished the medications  He then followed up with Dr. Harl Bowie in the cardiology office  In that visit it was noted that the patient had an ejection fraction of 20 to 25% which was significantly decreased compared to March 2019 when an echocardiogram revealed a 55 to 60% ejection fraction.  The patient was not complaining of shortness of breath or dyspnea on exertion, no chest pains and no recent viral illnesses according to the notes.  He had recently stopped an ACE inhibitor and started [facility]  The patient reports that he is still feeling some shortness of breath, he is feeling a wheezing sensation that is up in his throat and feels like he still has phlegm that he is coughing up sometimes white and sometimes colored in appearance.  He is not having any swelling of the legs and denies any chest pain or significant back pain, no headache, no nausea vomiting or diarrhea.  He is fully vaccinated for Covid and flu  It was noted that the patient was tachycardic in the office with Dr. Harl Bowie, his metoprolol was increased to 50 mg a day.  His left ventricular dysfunction was  thought to be related to his tachycardia and thus aggressive rate control was suggested      Past Medical History:  Diagnosis Date  . Arthritis    both hands  . Atrial fibrillation ([place]) 10/28/2017   Dr. Harl Bowie  . Blood in urine    occ  . Bronchitis   . Cancer (HCC)    Basal cell  . Cardiomegaly 10/28/2017  . Complication of anesthesia    Mr. Osuna developed a. fib in recovery after cysto procedure 10/2017 was transferred to [facility] and underwent successful cardioversion  . Diverticulosis of sigmoid colon 08/2017   Noted on colonoscopy  . Dysrhythmia   . External hemorrhoids 08/2017  . GERD (gastroesophageal reflux disease)   . History of colon polyps 08/2017  . History of kidney stones   . History of rectal bleeding   . History of right inguinal hernia   . Hypertension   . Nephrolithiasis 07/02/2019  . Rectal bleeding 09/02/2017   Added automatically from request for surgery [phone]  . Shortness of breath     Patient Active Problem List   Diagnosis Date Noted  . 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 Right flank pain 04/09/2020  . Hematuria 04/09/2020  . Atrial fibrillation ([place]) 03/20/2020  . Allergic rhinitis 07/02/2019  . Essential hypertension 08/08/2014  . GERD (gastroesophageal reflux disease) 08/08/2014    Past Surgical History:  Procedure Laterality Date  . CARDIOVERSION  10/2017  . CHOLECYSTECTOMY    . 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 CLEFT PALATE REPAIR     several from childhood til 74 years old  . COLONOSCOPY  04/28/2011   Procedure: COLONOSCOPY;  Surgeon: Rogene Houston, MD;  [facility];  Service: Endoscopy;  Laterality: N/A;  . COLONOSCOPY N/A 07/18/2014   Procedure: COLONOSCOPY;  Surgeon: Rogene Houston, MD;  [facility];  Service: Endoscopy;  Laterality: N/A;  830  . COLONOSCOPY N/A 09/09/2017   Procedure: COLONOSCOPY;  Surgeon: Rogene Houston, MD;  [facility];  Service:  Endoscopy;  Laterality: N/A;  955  . CYSTOSCOPY/URETEROSCOPY/HOLMIUM LASER/STENT PLACEMENT Bilateral 12/06/2017   Procedure: CYSTOSCOPY/RETROGRADE/URETEROSCOPY/HOLMIUM LASER/STENT EXCHANGE;  Surgeon: Festus Aloe, MD;  Location: [facility];  Service: Urology;  Laterality: Bilateral;  ONLY NEEDS 60 MIN  . HERNIA REPAIR    . POLYPECTOMY  09/09/2017   Procedure: POLYPECTOMY;  Surgeon: Rogene Houston, MD;  [facility];  Service: Endoscopy;;  colon  . vocal cord surgery         Family History  Problem Relation Age of Onset  . Breast cancer Mother   . Prostate cancer Father   . COPD Sister     Social History   Tobacco Use  . Smoking status: Never Smoker  . Smokeless tobacco: Never Used  Vaping Use  . Vaping Use: Never used  Substance Use Topics  . Alcohol use: Not Currently    Alcohol/week: 7.0 standard drinks    Types: 7 Cans of beer per week  . Drug use: No    Home Medications Prior to Admission medications   Medication Sig Start Date End Date Taking? Authorizing Provider  acetaminophen (TYLENOL) 500 MG tablet Take 1,000 mg by mouth every 6 (six) hours as needed for moderate pain or headache.    [provider]  cetirizine (ZYRTEC) 10 MG tablet Take 1 tablet (10 mg total) by mouth daily as needed for allergies. 01/01/20   Corum, Rex Kras, MD  ELIQUIS 5 MG TABS tablet TAKE ONE TABLET BY MOUTH 2 TIMES A DAY. Patient taking differently: Take 5 mg by mouth 2 (two) times daily.  04/21/20   Arnoldo Lenis, MD  furosemide (LASIX) 20 MG tablet Take 20 mg by mouth daily as needed for leg swelling 06/23/20   Arnoldo Lenis, MD  metoprolol succinate (TOPROL-XL) 50 MG 24 hr tablet Take 1 tablet (50 mg total) by mouth daily. Take with or immediately following a meal. 07/11/20 10/09/20  Arnoldo Lenis, MD  omeprazole (PRILOSEC) 20 MG capsule Take 1 capsule (20 mg total) by mouth daily as needed (for heartburn). 01/01/20   Corum, Rex Kras, MD  sacubitril-valsartan  (ENTRESTO) 24-26 MG Take 1 tablet by mouth 2 (two) times daily. 06/23/20   Arnoldo Lenis, MD  VENTOLIN HFA 108 [id number] Base) MCG/ACT inhaler Inhale 2 puffs into the lungs every 6 (six) hours as needed for wheezing or shortness of breath. 07/11/20   Fayrene Helper, MD    Allergies    Augmentin [amoxicillin-pot clavulanate] and Flomax [tamsulosin hcl]  Review of Systems   Review of Systems  All other systems reviewed and are negative.   Physical Exam Updated Vital Signs BP (!) 145/106 (BP Location: Right Arm)   Pulse (!) 142   Temp 98.2 F (36.8 C) (Oral)   Resp 20   Ht 1.829 m (6')  Wt 89.8 kg   SpO2 93%   BMI 26.85 kg/m   Physical Exam Vitals and nursing note reviewed.  Constitutional:      General: He is not in acute distress.    Appearance: He is well-developed.  HENT:     Head: Normocephalic and atraumatic.     Mouth/Throat:     Pharynx: No oropharyngeal exudate.  Eyes:     General: No scleral icterus.       Right eye: No discharge.        Left eye: No discharge.     Conjunctiva/sclera: Conjunctivae normal.     Pupils: Pupils are equal, round, and reactive to light.  Neck:     Thyroid: No thyromegaly.     Vascular: No JVD.  Cardiovascular:     Rate and Rhythm: Tachycardia present. Rhythm irregular.     Heart sounds: Normal heart sounds. No murmur heard.  No friction rub. No gallop.   Pulmonary:     Effort: Pulmonary effort is normal. No respiratory distress.     Breath sounds: Normal breath sounds. No wheezing or rales.  Abdominal:     General: Bowel sounds are normal. There is no distension.     Palpations: Abdomen is soft. There is no mass.     Tenderness: There is no abdominal tenderness.  Musculoskeletal:        General: No tenderness. Normal range of motion.     Cervical back: Normal range of motion and neck supple.  Lymphadenopathy:     Cervical: No cervical adenopathy.  Skin:    General: Skin is warm and dry.     Findings: No erythema or  rash.  Neurological:     Mental Status: He is alert.     Coordination: Coordination normal.  Psychiatric:        Behavior: Behavior normal.     ED Results / Procedures / Treatments   Labs (all labs ordered are listed, but only abnormal results are displayed) Labs Reviewed  CBC WITH DIFFERENTIAL/PLATELET - Abnormal; Notable for the following components:      Result Value   RDW 15.9 (*)    All other components within normal limits  COMPREHENSIVE METABOLIC PANEL - Abnormal; Notable for the following components:   CO2 21 (*)    Glucose, Bld 146 (*)    BUN 36 (*)    Creatinine, Ser 1.64 (*)    Calcium 8.5 (*)    Total Protein 6.2 (*)    Albumin 3.1 (*)    AST 53 (*)    ALT 104 (*)    Total Bilirubin 2.0 (*)    GFR, Estimated 44 (*)    All other components within normal limits  BRAIN NATRIURETIC PEPTIDE - Abnormal; Notable for the following components:   B Natriuretic Peptide 2,694.0 (*)    All other components within normal limits  PROTIME-INR - Abnormal; Notable for the following components:   Prothrombin Time 21.9 (*)    INR 2.0 (*)    All other components within normal limits  BASIC METABOLIC PANEL - Abnormal; Notable for the following components:   Potassium 5.4 (*)    CO2 21 (*)    Glucose, Bld 123 (*)    BUN 42 (*)    Creatinine, Ser 2.06 (*)    Calcium 8.6 (*)    GFR, Estimated 33 (*)    All other components within normal limits  GLUCOSE, CAPILLARY - Abnormal; Notable for the following components:   Glucose-Capillary  140 (*)    All other components within normal limits  TROPONIN I (HIGH SENSITIVITY) - Abnormal; Notable for the following components:   Troponin I (High Sensitivity) 24 (*)    All other components within normal limits  RESPIRATORY PANEL BY RT PCR (FLU A&B, COVID)  MRSA PCR SCREENING  MAGNESIUM    EKG EKG Interpretation  Date/Time:  Monday July 14 2020 10:00:00 EDT Ventricular Rate:  152 PR Interval:    QRS Duration: 95 QT  Interval:  303 QTC Calculation: 482 R Axis:   79 Text Interpretation: Atrial fibrillation with rapid V-rate Nonspecific T abnormalities, lateral leads Confirmed by Noemi Chapel [phone]) on 07/15/2020 7:09:33 AM   Radiology DG Chest Port 1 View  Result Date: 07/14/2020 CLINICAL DATA:  Shortness of breath, cough. EXAM: PORTABLE CHEST 1 VIEW COMPARISON:  July 06, 2020. FINDINGS: Stable cardiomegaly. No pneumothorax is noted. Mild bibasilar interstitial densities are noted concerning for edema or subsegmental atelectasis. Bony thorax is unremarkable. IMPRESSION: Mild bibasilar interstitial densities are noted concerning for edema or subsegmental atelectasis. Electronically Signed   By: Marijo Conception M.D.   On: 07/14/2020 11:00    Procedures .Critical Care Performed by: Noemi Chapel, MD Authorized by: Noemi Chapel, MD   Critical care provider statement:    Critical care time (minutes):  35   Critical care time was exclusive of:  Separately billable procedures and treating other patients and teaching time   Critical care was necessary to treat or prevent imminent or life-threatening deterioration of the following conditions:  Cardiac failure   Critical care was time spent personally by me on the following activities:  Blood draw for specimens, development of treatment plan with patient or surrogate, discussions with consultants, evaluation of patient's response to treatment, examination of patient, obtaining history from patient or surrogate, ordering and performing treatments and interventions, ordering and review of laboratory studies, ordering and review of radiographic studies, pulse oximetry, re-evaluation of patient's condition and review of old charts   (including critical care time)  Medications Ordered in ED Medications - No data to display  ED Course  I have reviewed the triage vital signs and the nursing notes.  Pertinent labs & imaging results that were available during  my care of the patient were reviewed by me and considered in my medical decision making (see chart for details).    MDM Rules/Calculators/A&P                          The patient's exam is remarkable for his atrial fibrillation with rapid ventricular rate.  His lungs are clear, his phonation is normal, the back of his pharynx and oral cavity appears moist and without significant swelling or edema.  He does have this recent worsening of his ejection fraction and systolic function suggestive that his feeling of shortness of breath wheezing and tachycardia may be in fact related more to his congestive heart failure and it becomes a chicken or the egg.  At this time I would recommend that the patient have more aggressive rate control with intravenous diltiazem, would also recommend a repeat chest x-ray to rule out bacterial infection.  That being said the patient is afebrile and his blood pressure is 145/106.  Pt has ongoing a fib with RVR - needs admission for ongoing eval and treatment of the tachycardia - has improved with meds.  I discussed c are with Dr. Harl Bowie - has added amiodarone and recommends admission.  Final Clinical Impression(s) / ED Diagnoses Final diagnoses:  Atrial fibrillation with RVR ([place])  Acute on chronic congestive heart failure, unspecified heart failure type ([place])      Noemi Chapel, MD 07/15/20 [phone]

## 2020-07-14 NOTE — ED Notes (Signed)
Nurse unable to take report at this time, will call back when available.

## 2020-07-15 ENCOUNTER — Telehealth: Payer: Medicare Other | Admitting: Family Medicine

## 2020-07-15 DIAGNOSIS — J9601 Acute respiratory failure with hypoxia: Secondary | ICD-10-CM

## 2020-07-15 DIAGNOSIS — I5023 Acute on chronic systolic (congestive) heart failure: Secondary | ICD-10-CM | POA: Diagnosis not present

## 2020-07-15 DIAGNOSIS — I4891 Unspecified atrial fibrillation: Secondary | ICD-10-CM | POA: Diagnosis not present

## 2020-07-15 LAB — MAGNESIUM: Magnesium: 2 mg/dL (ref 1.7–2.4)

## 2020-07-15 LAB — BASIC METABOLIC PANEL
Anion gap: 10 (ref 5–15)
BUN: 42 mg/dL — ABNORMAL HIGH (ref 8–23)
CO2: 21 mmol/L — ABNORMAL LOW (ref 22–32)
Calcium: 8.6 mg/dL — ABNORMAL LOW (ref 8.9–10.3)
Chloride: 107 mmol/L (ref 98–111)
Creatinine, Ser: 2.06 mg/dL — ABNORMAL HIGH (ref 0.61–1.24)
GFR, Estimated: 33 mL/min — ABNORMAL LOW (ref >60)
Glucose, Bld: 123 mg/dL — ABNORMAL HIGH (ref 70–99)
Potassium: 5.4 mmol/L — ABNORMAL HIGH (ref 3.5–5.1)
Sodium: 138 mmol/L (ref 135–145)

## 2020-07-15 MED ORDER — ISOSORBIDE MONONITRATE ER 30 MG PO TB24
15.0000 mg | ORAL_TABLET | Freq: Every day | ORAL | Status: DC
  Administered 2020-07-15 – 2020-07-18 (×4): 15 mg via ORAL
  Filled 2020-07-15 (×4): qty 1

## 2020-07-15 MED ORDER — HYDRALAZINE HCL 25 MG PO TABS
37.5000 mg | ORAL_TABLET | Freq: Three times a day (TID) | ORAL | Status: DC
  Administered 2020-07-15 – 2020-07-18 (×9): 37.5 mg via ORAL
  Filled 2020-07-15 (×10): qty 2

## 2020-07-15 MED ORDER — AMIODARONE LOAD VIA INFUSION
150.0000 mg | Freq: Once | INTRAVENOUS | Status: AC
  Administered 2020-07-15: 150 mg via INTRAVENOUS
  Filled 2020-07-15: qty 83.34

## 2020-07-15 NOTE — Progress Notes (Addendum)
PROGRESS NOTE  Darius Smith SKA:[id number] DOB: [birth year] DOA: 07/14/2020 PCP: Perlie Mayo, NP  Brief History:  74 y.o. male with medical history of paroxysmal atrial fibrillation on apixaban, COPD, systolic CHF, follicular lymphoma, hypertension presenting with shortness of breath for the past 2 weeks.  The patient has also been complaining of nonproductive cough and chest congestion.  He came to the emergency department on 07/06/2020 with sore throat, nonproductive cough and nasal congestion.  He was discharged home in stable condition with prednisone.  He states that his breathing did not improve very much.  He continued to have nonproductive cough.  He also describes orthopnea type symptoms.  He denies any worsening lower extremity edema or increasing abdominal girth.  The patient went to see his cardiologist, Dr. Harl Bowie on 07/11/2020.  At that time, the patient was noted to have atrial fibrillation with RVR.  His metoprolol succinate was increased to 50 mg daily.  He was continued on his Entresto.  However, the patient continued to have shortness of breath over the weekend with coughing and orthopnea.  As result, he presented for further evaluation. He denies any fevers, chills, chest pain, headache, nausea, vomiting, diarrhea, abdominal pain, dysuria, hematuria. In the emergency department, the patient was afebrile hemodynamically stable with oxygen saturation 89-93% on room air.  BMP showed a serum creatinine of 1.64.  AST 53, ALT 104, alkaline phosphatase 53, total bilirubin 2.0.  WBC 9.1, hemoglobin 13.8, platelets 360,000.  Chest x-ray showed increased interstitial markings and right lower lobe opacity.  The patient was initially started on diltiazem drip, but this was transitioned to an amiodarone drip given to his low EF.  Cardiology was consulted to assist with management.  Assessment/Plan:  Acute on chronic systolic CHF -Daily weight--not accurate -felt to be  tachyarrthymia related -had 2 doses IV furosemide 10/25-->stopped 10/26 due to uptrend serum creatinine -A.m. BMP -Accurate I's and O's--incomplete -06/12/2020 echo EF 20-25%, global HK, mild decreased RV function, trivial MR -Discontinue Entresto due to uptrend serum creatinine -cardiology consult appreciated -hydralazine/imdur added 10/26 -remains clinically fluid overloaded  Atrial fibrillation with RVR--paroxysmal -Continue amiodarone drip -07/06/2020 TSH 1.242 -Continue apixaban -planning DCCV cardioversion 10/27 -Discontinue diltiazem -Continue metoprolol succinate  Acute respiratory Failure with Hypoxia -initially 89% on RA -stable on 2L -due to pulmonary edema -wean for saturation >92%  Acute on chronic CKD stage IIIb -Baseline creatinine 1.4-1.6 -uptrend serum creatinine due to IV lasix -Monitor with diuresis  Stage IVa follicular lymphoma -Patient follows with medical oncology, Dr. Delton Coombes -Currently under observation -06/23/2020 PET scan shows hypermetabolic lymph nodes in the neck, axillary region, and small bowel mesentery nodes.  Hypermetabolic lesions in the spine, pelvis, and bilateral hips  Essential hypertension -Continue metoprolol succinate -hydralazine and imdur added      Status is: Inpatient  Remains inpatient appropriate because:IV treatments appropriate due to intensity of illness or inability to take PO   Dispo: The patient is from: Home              Anticipated d/c is to: Home              Anticipated d/c date is: 2 days              Patient currently is not medically stable to d/c.        Family Communication:   Spouse updated at bedside 10/26  Consultants:  cardiology  Code Status:  FULL   DVT  Prophylaxis:  apixaban   Procedures: As Listed in Progress Note Above  Antibiotics: None      Subjective: Patient states he is breathing better but continues to have cough and dyspnea on exertion.  Denies f/c, cp,  n/v/d  Objective: Vitals:   07/15/20 1200 07/15/20 1300 07/15/20 1350 07/15/20 1400  BP: (!) 126/98  116/82 (!) 124/105  Pulse: 61  99 60  Resp: 15  13 (!) 24  Temp:  (!) 97.5 F (36.4 C)    TempSrc:  Oral    SpO2: 95%  92% 94%  Weight:      Height:        Intake/Output Summary (Last 24 hours) at 07/15/2020 1726 Last data filed at 07/15/2020 1400 Gross per 24 hour  Intake 704.52 ml  Output 525 ml  Net 179.52 ml   Weight change:  Exam:   General:  Pt is alert, follows commands appropriately, not in acute distress  HEENT: No icterus, No thrush, No neck mass, [place]/AT  Cardiovascular: RRR, S1/S2, no rubs, no gallops  Respiratory: CTA bilaterally, no wheezing, no crackles, no rhonchi  Abdomen: Soft/+BS, non tender, non distended, no guarding  Extremities: No edema, No lymphangitis, No petechiae, No rashes, no synovitis   Data Reviewed: I have personally reviewed following labs and imaging studies Basic Metabolic Panel: Recent Labs  Lab 07/14/20 1031 07/15/20 0343  NA 137 138  K 4.3 5.4*  CL 107 107  CO2 21* 21*  GLUCOSE 146* 123*  BUN 36* 42*  CREATININE 1.64* 2.06*  CALCIUM 8.5* 8.6*  MG  --  2.0   Liver Function Tests: Recent Labs  Lab 07/14/20 1031  AST 53*  ALT 104*  ALKPHOS 53  BILITOT 2.0*  PROT 6.2*  ALBUMIN 3.1*   No results for input(s): LIPASE, AMYLASE in the last 168 hours. No results for input(s): AMMONIA in the last 168 hours. Coagulation Profile: Recent Labs  Lab 07/14/20 1031  INR 2.0*   CBC: Recent Labs  Lab 07/14/20 1031  WBC 9.4  NEUTROABS 7.3  HGB 13.8  HCT 45.2  MCV 91.3  PLT 300   Cardiac Enzymes: No results for input(s): CKTOTAL, CKMB, CKMBINDEX, TROPONINI in the last 168 hours. BNP: Invalid input(s): POCBNP CBG: Recent Labs  Lab 07/14/20 1958  GLUCAP 140*   HbA1C: No results for input(s): HGBA1C in the last 72 hours. Urine analysis:    Component Value Date/Time   COLORURINE YELLOW 09/22/2018 1009    APPEARANCEUR Clear 06/10/2020 0851   LABSPEC 1.012 09/22/2018 1009   PHURINE 6.0 09/22/2018 1009   GLUCOSEU Negative 06/10/2020 0851   HGBUR SMALL (A) 09/22/2018 1009   BILIRUBINUR Negative 06/10/2020 0851   KETONESUR trace (5) (A) 04/09/2020 1050   KETONESUR NEGATIVE 09/22/2018 1009   PROTEINUR 1+ (A) 06/10/2020 0851   PROTEINUR NEGATIVE 09/22/2018 1009   UROBILINOGEN 2.0 (A) 04/09/2020 1050   NITRITE Negative 06/10/2020 0851   NITRITE NEGATIVE 09/22/2018 1009   LEUKOCYTESUR Negative 06/10/2020 0851   Sepsis Labs: @LABRCNTIP (procalcitonin:4,lacticidven:4) ) Recent Results (from the past 240 hour(s))  Respiratory Panel by RT PCR (Flu A&B, Covid) - Nasopharyngeal Swab     Status: None   Collection Time: 07/06/20 10:15 AM   Specimen: Nasopharyngeal Swab  Result Value Ref Range Status   SARS Coronavirus 2 by RT PCR NEGATIVE NEGATIVE Final    Comment: (NOTE) SARS-CoV-2 target nucleic acids are NOT DETECTED.  The SARS-CoV-2 RNA is generally detectable in upper respiratoy specimens during the acute phase of  infection. The lowest concentration of SARS-CoV-2 viral copies this assay can detect is 131 copies/mL. A negative result does not preclude SARS-Cov-2 infection and should not be used as the sole basis for treatment or other patient management decisions. A negative result may occur with  improper specimen collection/handling, submission of specimen other than nasopharyngeal swab, presence of viral mutation(s) within the areas targeted by this assay, and inadequate number of viral copies (<131 copies/mL). A negative result must be combined with clinical observations, patient history, and epidemiological information. The expected result is Negative.  Fact Sheet for Patients:  PinkCheek.be  Fact Sheet for Healthcare Providers:  GravelBags.it  This test is no t yet approved or cleared by the [place] FDA and  has  been authorized for detection and/or diagnosis of SARS-CoV-2 by FDA under an Emergency Use Authorization (EUA). This EUA will remain  in effect (meaning this test can be used) for the duration of the COVID-19 declaration under Section 564(b)(1) of the Act, 21 U.S.C. section 360bbb-3(b)(1), unless the authorization is terminated or revoked sooner.     Influenza A by PCR NEGATIVE NEGATIVE Final   Influenza B by PCR NEGATIVE NEGATIVE Final    Comment: (NOTE) The Xpert Xpress SARS-CoV-2/FLU/RSV assay is intended as an aid in  the diagnosis of influenza from Nasopharyngeal swab specimens and  should not be used as a sole basis for treatment. Nasal washings and  aspirates are unacceptable for Xpert Xpress SARS-CoV-2/FLU/RSV  testing.  Fact Sheet for Patients: PinkCheek.be  Fact Sheet for Healthcare Providers: GravelBags.it  This test is not yet approved or cleared by the [place] FDA and  has been authorized for detection and/or diagnosis of SARS-CoV-2 by  FDA under an Emergency Use Authorization (EUA). This EUA will remain  in effect (meaning this test can be used) for the duration of the  Covid-19 declaration under Section 564(b)(1) of the Act, 21  U.S.C. section 360bbb-3(b)(1), unless the authorization is  terminated or revoked. Performed at [facility], [address], [place], [place] [address]   Respiratory Panel by RT PCR (Flu A&B, Covid) - Nasopharyngeal Swab     Status: None   Collection Time: 07/14/20 11:44 AM   Specimen: Nasopharyngeal Swab  Result Value Ref Range Status   SARS Coronavirus 2 by RT PCR NEGATIVE NEGATIVE Final    Comment: (NOTE) SARS-CoV-2 target nucleic acids are NOT DETECTED.  The SARS-CoV-2 RNA is generally detectable in upper respiratoy specimens during the acute phase of infection. The lowest concentration of SARS-CoV-2 viral copies this assay can detect is 131 copies/mL. A negative  result does not preclude SARS-Cov-2 infection and should not be used as the sole basis for treatment or other patient management decisions. A negative result may occur with  improper specimen collection/handling, submission of specimen other than nasopharyngeal swab, presence of viral mutation(s) within the areas targeted by this assay, and inadequate number of viral copies (<131 copies/mL). A negative result must be combined with clinical observations, patient history, and epidemiological information. The expected result is Negative.  Fact Sheet for Patients:  PinkCheek.be  Fact Sheet for Healthcare Providers:  GravelBags.it  This test is no t yet approved or cleared by the [place] FDA and  has been authorized for detection and/or diagnosis of SARS-CoV-2 by FDA under an Emergency Use Authorization (EUA). This EUA will remain  in effect (meaning this test can be used) for the duration of the COVID-19 declaration under Section 564(b)(1) of the Act, 21 U.S.C. section 360bbb-3(b)(1),  unless the authorization is terminated or revoked sooner.     Influenza A by PCR NEGATIVE NEGATIVE Final   Influenza B by PCR NEGATIVE NEGATIVE Final    Comment: (NOTE) The Xpert Xpress SARS-CoV-2/FLU/RSV assay is intended as an aid in  the diagnosis of influenza from Nasopharyngeal swab specimens and  should not be used as a sole basis for treatment. Nasal washings and  aspirates are unacceptable for Xpert Xpress SARS-CoV-2/FLU/RSV  testing.  Fact Sheet for Patients: PinkCheek.be  Fact Sheet for Healthcare Providers: GravelBags.it  This test is not yet approved or cleared by the [place] FDA and  has been authorized for detection and/or diagnosis of SARS-CoV-2 by  FDA under an Emergency Use Authorization (EUA). This EUA will remain  in effect (meaning this test can be used)  for the duration of the  Covid-19 declaration under Section 564(b)(1) of the Act, 21  U.S.C. section 360bbb-3(b)(1), unless the authorization is  terminated or revoked. Performed at [facility], [address] [place], [place] [address]   MRSA PCR Screening     Status: None   Collection Time: 07/14/20  2:22 PM   Specimen: Nasal Mucosa; Nasopharyngeal  Result Value Ref Range Status   MRSA by PCR NEGATIVE NEGATIVE Final    Comment:        The GeneXpert MRSA Assay (FDA approved for NASAL specimens only), is one component of a comprehensive MRSA colonization surveillance program. It is not intended to diagnose MRSA infection nor to guide or monitor treatment for MRSA infections. Performed at [facility], [address], [place], [place] [address]      Scheduled Meds: . apixaban  5 mg Oral BID  . benzonatate  200 mg Oral TID  . Chlorhexidine Gluconate Cloth  6 each Topical Daily  . hydrALAZINE  37.5 mg Oral TID  . isosorbide mononitrate  15 mg Oral Daily  . loratadine  10 mg Oral Daily  . metoprolol succinate  50 mg Oral Daily  . pantoprazole  40 mg Oral Daily  . sodium chloride flush  3 mL Intravenous Q12H   Continuous Infusions: . sodium chloride    . amiodarone 30 mg/hr (07/15/20 1400)    Procedures/Studies: NM PET Image Initial (PI) Skull Base To Thigh  Result Date: 06/23/2020 CLINICAL DATA:  Initial treatment strategy for follicular lymphoma. EXAM: NUCLEAR MEDICINE PET SKULL BASE TO THIGH TECHNIQUE: 10.6 mCi F-18 FDG was injected intravenously. Full-ring PET imaging was performed from the skull base to thigh after the radiotracer. CT data was obtained and used for attenuation correction and anatomic localization. Fasting blood glucose: 86 mg/dl COMPARISON:  Chest CT on 05/27/2020 FINDINGS: Mediastinal blood-pool activity (background): SUV max = 2.3 Liver activity (reference): SUV max = 3.3 NECK: Multiple small hypermetabolic lymph nodes are seen in both parotid glands and  in levels 2 and 5A bilaterally. Index lymph node on the right at level 2B measures 1.2 cm on image 17/4, and has SUV max of 11.4. Index lymph node along the left at level 2B measures 1.0 cm on image 26/4, and has SUV max of 6.7. Focus of hypermetabolic activity in the left nasopharynx has SUV max of 5.1. Hypermetabolic soft tissue nodule in the subcutaneous tissues of the left posterior scalp measuring 1.8 x 1.1 cm on image 14/4, has SUV max of 10.4. A focus of hypermetabolic activity is also seen in the left thyroid lobe, with SUV max of 5.0. Incidental CT findings:  None. CHEST: Left axillary lymphadenopathy is seen  which measures 4.7 x 4.6 cm on image 67/4, with SUV max of 16.8. A sub-cm right axillary lymph node is seen on image 70/4, which has SUV max of 2.7. No hypermetabolic mediastinal or hilar lymph nodes. Incidental CT findings: Tiny bilateral pleural effusions, without hypermetabolic activity. Moderate to severe cardiomegaly. ABDOMEN/PELVIS: No abnormal hypermetabolic activity within the liver, pancreas, adrenal glands, or spleen. A few sub-cm lymph nodes are seen in the small bowel mesentery, 1 of which is hypermetabolic with SUV max of 4.7. Mild hypermetabolic lymphadenopathy is seen both inguinal regions, with index node on the left measuring 9 mm on image 199/4, with SUV max of 8.8. Incidental CT findings:  None. SKELETON: Multiple hypermetabolic bone lesions are seen throughout the spine and pelvis, and involving both hips, some corresponding to lytic bone lesions seen on CT images. Index hypermetabolic lesion involving the T11 vertebra has SUV max of 15.8. Incidental CT findings:  None. IMPRESSION: Hypermetabolic lymphadenopathy in the neck, axillary regions, small bowel mesentery, and inguinal regions, consistent with lymphoma. (Deauville score 5). Hypermetabolic bone lesions throughout the spine, pelvis, and bilateral hips. Electronically Signed   By: Marlaine Hind M.D.   On: 06/23/2020 16:21    DG Chest Port 1 View  Result Date: 07/14/2020 CLINICAL DATA:  Shortness of breath, cough. EXAM: PORTABLE CHEST 1 VIEW COMPARISON:  July 06, 2020. FINDINGS: Stable cardiomegaly. No pneumothorax is noted. Mild bibasilar interstitial densities are noted concerning for edema or subsegmental atelectasis. Bony thorax is unremarkable. IMPRESSION: Mild bibasilar interstitial densities are noted concerning for edema or subsegmental atelectasis. Electronically Signed   By: Marijo Conception M.D.   On: 07/14/2020 11:00   DG Chest Portable 1 View  Result Date: 07/06/2020 CLINICAL DATA:  Cough, sneezing, sinus irritation EXAM: PORTABLE CHEST 1 VIEW COMPARISON:  CT chest dated 05/27/2020 FINDINGS: Increased interstitial markings in the perihilar lungs, right greater than left, favoring very mild perihilar edema. Mild right basilar opacity, favoring a small right pleural effusion with associated right basilar atelectasis. No pneumothorax. Cardiomegaly. IMPRESSION: Cardiomegaly with suspected mild perihilar edema and small right pleural effusion. Electronically Signed   By: Julian Hy M.D.   On: 07/06/2020 10:25    Orson Eva, DO  [facility]  If 7PM-7AM, please contact night-coverage [email or web address] Password [email or web address] 07/15/2020, 5:26 PM   LOS: 1 day

## 2020-07-15 NOTE — TOC Initial Note (Signed)
Transition of Care [facility]) - Initial/Assessment Note    Patient Details  Name: Darius Smith MRN: [id number] Date of Birth: 04-19-1946  [facility]) CM/SW Contact:    Iona Beard, [place] Phone Number: 07/15/2020, 6:13 PM  Clinical Narrative:                 Pt admitted for acute on chronic systolic CHF. TOC received CHF consult. Pt states that he lives at home with his wife. Pt states he can complete his ADL's independently and drives himself. Pt states that he has never has [place] services and he does not use any DME equipment. Pt states that he plans to return home at d/c. Pt states that if [facility] is needed at discharge he may be accepting but wanted to "wait and see how things go". CSW explained that we will continue to follow and if it is recommended we can set up if pt is accepting.   CSW completed CHD screen with pt. Pt states that he does weigh himself daily. Pt also states that he tries to stay on a heart healthy diet. Pts cardiologist is Dr. Harl Bowie. Pt also states that he takes all of his medications as they are prescribed. TOC to follow.   Expected Discharge Plan: Home/Self Care Barriers to Discharge: Continued Medical Work up   Patient Goals and CMS Choice Patient states their goals for this hospitalization and ongoing recovery are:: Return home at d/c   Choice offered to / list presented to : NA  Expected Discharge Plan and Services Expected Discharge Plan: Home/Self Care In-house Referral: Clinical Social Work Discharge Planning Services: NA   Living arrangements for the past 2 months: Single Family Home                 DME Arranged: N/A DME Agency: NA                  Prior Living Arrangements/Services Living arrangements for the past 2 months: Single Family Home Lives with:: Spouse Patient language and need for interpreter reviewed:: Yes Do you feel safe going back to the place where you live?: Yes            Criminal Activity/Legal Involvement  Pertinent to Current Situation/Hospitalization: No - Comment as needed  Activities of Daily Living Home Assistive Devices/Equipment: Eyeglasses, Scales ADL Screening (condition at time of admission) Patient's cognitive ability adequate to safely complete daily activities?: Yes Is the patient deaf or have difficulty hearing?: No Does the patient have difficulty seeing, even when wearing glasses/contacts?: No Does the patient have difficulty concentrating, remembering, or making decisions?: No Patient able to express need for assistance with ADLs?: Yes Does the patient have difficulty dressing or bathing?: No Independently performs ADLs?: Yes (appropriate for developmental age) Does the patient have difficulty walking or climbing stairs?: No Weakness of Legs: None Weakness of Arms/Hands: None  Permission Sought/Granted                  Emotional Assessment Appearance:: Appears stated age Attitude/Demeanor/Rapport: Engaged Affect (typically observed): Accepting Orientation: : Oriented to Self, Oriented to Place, Oriented to  Time, Oriented to Situation Alcohol / Substance Use: Not Applicable Psych Involvement: No (comment)  Admission diagnosis:  Atrial fibrillation with RVR (HCC) [I48.91] Acute on chronic congestive heart failure, unspecified heart failure type [facility]) [I50.9] Patient Active Problem List   Diagnosis Date Noted  . Acute respiratory failure with hypoxia ([place]) 07/15/2020  . Atrial fibrillation with RVR (  [place]) 07/14/2020  . Acute on chronic systolic CHF (congestive heart failure) ([place]) 07/14/2020  . Acute on chronic congestive heart failure ([place])   . Follicular lymphoma ([place]) 06/11/2020  . Right flank pain 04/09/2020  . Hematuria 04/09/2020  . Atrial fibrillation ([place]) 03/20/2020  . Allergic rhinitis 07/02/2019  . Essential hypertension 08/08/2014  . GERD (gastroesophageal reflux disease) 08/08/2014   PCP:  Perlie Mayo, NP Pharmacy:   [facility], [place] [address] Phone: [phone] Fax: [phone]     Social Determinants of Health (SDOH) Interventions    Readmission Risk Interventions No flowsheet data found.

## 2020-07-15 NOTE — Progress Notes (Signed)
Progress Note  Patient Name: Darius Smith Date of Encounter: 07/15/2020  [facility] Cardiologist: Carlyle Dolly, MD   Subjective   Some improvement in SOB, some ongoing cough Inpatient Medications    Scheduled Meds: . apixaban  5 mg Oral BID  . benzonatate  200 mg Oral TID  . Chlorhexidine Gluconate Cloth  6 each Topical Daily  . furosemide  40 mg Intravenous Daily  . loratadine  10 mg Oral Daily  . metoprolol succinate  50 mg Oral Daily  . pantoprazole  40 mg Oral Daily  . sacubitril-valsartan  1 tablet Oral BID  . sodium chloride flush  3 mL Intravenous Q12H   Continuous Infusions: . sodium chloride    . amiodarone 30 mg/hr (07/15/20 0052)   PRN Meds: sodium chloride, acetaminophen, ondansetron (ZOFRAN) IV, sodium chloride flush   Vital Signs    Vitals:   07/15/20 0200 07/15/20 0400 07/15/20 0500 07/15/20 0600  BP: (!) 115/99 (!) 136/100 (!) 139/109 (!) 135/97  Pulse: (!) 111 60 (!) 117 (!) 50  Resp: (!) 29 (!) 22 (!) 22 19  Temp:  (!) 97.4 F (36.3 C)    TempSrc:  Oral    SpO2: 91% 94% 94% 93%  Weight:      Height:        Intake/Output Summary (Last 24 hours) at 07/15/2020 0805 Last data filed at 07/15/2020 0644 Gross per 24 hour  Intake 196.05 ml  Output 575 ml  Net -378.95 ml   Last 3 Weights 07/14/2020 07/14/2020 07/11/2020  Weight (lbs) 203 lb 11.3 oz 198 lb 206 lb 12.8 oz  Weight (kg) 92.4 kg 89.812 kg 93.804 kg      Telemetry    afib 120s to 130s- Personally Reviewed  ECG    N/a  Personally Reviewed  Physical Exam   GEN: No acute distress.   Neck: No JVD Cardiac: irreg, tachy, no murmurs, rubs, or gallops.  Respiratory: Clear to auscultation bilaterally. GI: Soft, nontender, non-distended  MS: No edema; No deformity. Neuro:  Nonfocal  Psych: Normal affect   Labs    High Sensitivity Troponin:   Recent Labs  Lab 07/14/20 1031  TROPONINIHS 24*      Chemistry Recent Labs  Lab 07/14/20 1031 07/15/20 0343  NA 137  138  K 4.3 5.4*  CL 107 107  CO2 21* 21*  GLUCOSE 146* 123*  BUN 36* 42*  CREATININE 1.64* 2.06*  CALCIUM 8.5* 8.6*  PROT 6.2*  --   ALBUMIN 3.1*  --   AST 53*  --   ALT 104*  --   ALKPHOS 53  --   BILITOT 2.0*  --   GFRNONAA 44* 33*  ANIONGAP 9 10     Hematology Recent Labs  Lab 07/14/20 1031  WBC 9.4  RBC 4.95  HGB 13.8  HCT 45.2  MCV 91.3  MCH 27.9  MCHC 30.5  RDW 15.9*  PLT 300    BNP Recent Labs  Lab 07/14/20 1031  BNP 2,694.0*     DDimer No results for input(s): DDIMER in the last 168 hours.   Radiology    DG Chest Port 1 View  Result Date: 07/14/2020 CLINICAL DATA:  Shortness of breath, cough. EXAM: PORTABLE CHEST 1 VIEW COMPARISON:  July 06, 2020. FINDINGS: Stable cardiomegaly. No pneumothorax is noted. Mild bibasilar interstitial densities are noted concerning for edema or subsegmental atelectasis. Bony thorax is unremarkable. IMPRESSION: Mild bibasilar interstitial densities are noted concerning for edema or subsegmental  atelectasis. Electronically Signed   By: Marijo Conception M.D.   On: 07/14/2020 11:00    Cardiac Studies     Patient Profile     Darius Smith is a 74 y.o. male with a hx of PAF and chronic systolic heart failiure who is being seen today for the evaluation of SOB and tachycardia  at the request of Dr Tat.  Assessment & Plan    1. Acute on chronic systolic HF - initially LV dysfunction noted incidentally on echo 05/2020 done for possible left atrial filling defect noted on CT, had not been symptomatic at that time - recent issues with SOB just last few days. BNP this admit 2000s, CXR with edema.   - neg 395mL yesterday, received IV lasix 40mg  bid. Weights appear inaccurate. Uptrend in Cr, we will hold entresto. Start hydral/nitrates in interim - with uptrend in Cr hold diuretic today. May have better luck diuresing once back in SR.   - we have deferred cath given recent diagnosis of stage IV lymphoma.   2. PAF - by  EKGs was SR Jan 2020 - historically dynamap inaccurate for his rates, requires EKGs. Also misleading as he is typically asymptomatic with elevated rates.  - given significant dysfunction that may be tachy mediated, difficulty controlling with beta blocker alone, avoiding CCB due to systolic dysfunction, renal function and HF limits antiarrhythmic options we have started amiodarone. With renal function also would be reluctant for digoxin at this time.  - aggressive management of rhythm as inpatient as may have tachy mediated CM, if does not convert with amio alone likely plan for DCCV this admission  -tele shows remains in afib with elevated rates.  - rebolus amio 150 mg then resume drip, plan for cardioversion tomorrow.    3. Stage IV lymphoma - from onc notes just monitoring at this time, in absence of symptoms no indciation for therapy.   For questions or updates, please contact [place] Please consult [email or web address] for contact info under        Signed, Carlyle Dolly, MD  07/15/2020, 8:05 AM

## 2020-07-16 ENCOUNTER — Inpatient Hospital Stay (HOSPITAL_COMMUNITY): Payer: Medicare Other | Admitting: Anesthesiology

## 2020-07-16 ENCOUNTER — Encounter (HOSPITAL_COMMUNITY): Payer: Self-pay | Admitting: Internal Medicine

## 2020-07-16 ENCOUNTER — Encounter (HOSPITAL_COMMUNITY)
Admission: EM | Disposition: A | Discharge status: Home / Self Care | Payer: Self-pay | Source: Home / Self Care | Attending: Internal Medicine

## 2020-07-16 DIAGNOSIS — N1832 Chronic kidney disease, stage 3b: Secondary | ICD-10-CM

## 2020-07-16 DIAGNOSIS — C8258 Diffuse follicle center lymphoma, lymph nodes of multiple sites: Secondary | ICD-10-CM

## 2020-07-16 DIAGNOSIS — N179 Acute kidney failure, unspecified: Secondary | ICD-10-CM

## 2020-07-16 DIAGNOSIS — I4891 Unspecified atrial fibrillation: Secondary | ICD-10-CM

## 2020-07-16 DIAGNOSIS — I5023 Acute on chronic systolic (congestive) heart failure: Secondary | ICD-10-CM | POA: Diagnosis not present

## 2020-07-16 HISTORY — PX: CARDIOVERSION: SHX1299

## 2020-07-16 LAB — BASIC METABOLIC PANEL
Anion gap: 11 (ref 5–15)
BUN: 47 mg/dL — ABNORMAL HIGH (ref 8–23)
CO2: 20 mmol/L — ABNORMAL LOW (ref 22–32)
Calcium: 8.1 mg/dL — ABNORMAL LOW (ref 8.9–10.3)
Chloride: 106 mmol/L (ref 98–111)
Creatinine, Ser: 1.91 mg/dL — ABNORMAL HIGH (ref 0.61–1.24)
GFR, Estimated: 36 mL/min — ABNORMAL LOW (ref >60)
Glucose, Bld: 98 mg/dL (ref 70–99)
Potassium: 4.7 mmol/L (ref 3.5–5.1)
Sodium: 137 mmol/L (ref 135–145)

## 2020-07-16 SURGERY — CARDIOVERSION
Anesthesia: General

## 2020-07-16 MED ORDER — PROPOFOL 10 MG/ML IV BOLUS
INTRAVENOUS | Status: DC | PRN
  Administered 2020-07-16: 20 mg via INTRAVENOUS
  Administered 2020-07-16: 50 mg via INTRAVENOUS
  Administered 2020-07-16: 30 mg via INTRAVENOUS

## 2020-07-16 MED ORDER — LACTATED RINGERS IV SOLN: INTRAVENOUS | Status: DC

## 2020-07-16 MED ORDER — PROPOFOL 10 MG/ML IV BOLUS
INTRAVENOUS | Status: AC
  Filled 2020-07-16: qty 20

## 2020-07-16 MED ORDER — METOPROLOL SUCCINATE ER 25 MG PO TB24
25.0000 mg | ORAL_TABLET | Freq: Once | ORAL | Status: AC
  Administered 2020-07-16: 25 mg via ORAL
  Filled 2020-07-16 (×2): qty 1

## 2020-07-16 MED ORDER — METOPROLOL SUCCINATE ER 50 MG PO TB24
75.0000 mg | ORAL_TABLET | Freq: Every day | ORAL | Status: DC
  Administered 2020-07-17 – 2020-07-18 (×2): 75 mg via ORAL
  Filled 2020-07-16 (×3): qty 1

## 2020-07-16 NOTE — CV Procedure (Signed)
Electrical Cardioversion Procedure Note ZAXTON ANGERER [id number] 07/02/1946  Procedure: Electrical Cardioversion Indications:  Atrial fibrillation  Procedure Details Consent: signed and on chart verified Time Out: Verified patient identification, verified procedure, site/side was marked, verified correct patient position, special equipment/implants available, medications/allergies/relevent history reviewed, required imaging and test 1306  Patient placed on cardiac monitor, pulse oximetry, supplemental oxygen as necessary.  Sedation given: 1307 Pacer pads placed 1300 anterior posterior and verified by Dr. Harl Bowie  Cardioverted 3 time(s).  Cardioverted at 1312 @ 200 Joules, 1315 @ 200 Joules, 1319 @ 200 Joules sychronized shock  Evaluation Findings: Post procedure EKG shows: 1320 atrial fibrillation Complications: none Patient {did tolerate procedure well.   Darius Smith 07/16/2020, 1:41 PM

## 2020-07-16 NOTE — Progress Notes (Signed)
PROGRESS NOTE  Darius Smith WLS:[id number] DOB: [birth year] DOA: 07/14/2020 PCP: Perlie Mayo, NP  Brief History:  74 y.o. male with medical history of paroxysmal atrial fibrillation on apixaban, COPD, systolic CHF, follicular lymphoma, hypertension presenting with shortness of breath for the past 2 weeks.  The patient has also been complaining of nonproductive cough and chest congestion.  He came to the emergency department on 07/06/2020 with sore throat, nonproductive cough and nasal congestion.  He was discharged home in stable condition with prednisone.  He states that his breathing did not improve very much.  He continued to have nonproductive cough.  He also describes orthopnea type symptoms.  He denies any worsening lower extremity edema or increasing abdominal girth.  The patient went to see his cardiologist, Dr. Harl Bowie on 07/11/2020.  At that time, the patient was noted to have atrial fibrillation with RVR.  His metoprolol succinate was increased to 50 mg daily.  He was continued on his Entresto.  However, the patient continued to have shortness of breath over the weekend with coughing and orthopnea.  As result, he presented for further evaluation. He denies any fevers, chills, chest pain, headache, nausea, vomiting, diarrhea, abdominal pain, dysuria, hematuria. In the emergency department, the patient was afebrile hemodynamically stable with oxygen saturation 89-93% on room air.  BMP showed a serum creatinine of 1.64.  AST 53, ALT 104, alkaline phosphatase 53, total bilirubin 2.0.  WBC 9.1, hemoglobin 13.8, platelets 360,000.  Chest x-ray showed increased interstitial markings and right lower lobe opacity.  The patient was initially started on diltiazem drip, but this was transitioned to an amiodarone drip given to his low EF.  Cardiology was consulted to assist with management.  Assessment/Plan:  Acute on chronic systolic CHF -Continue to follow daily weights, strict I's and O's  and low-sodium diet. -Patient's acute exacerbation felt to be tachyarrthymia related -He has had 2 doses IV furosemide 10/25-->stopped 10/26 due to uptrend in his serum creatinine -Continue to follow BMP -06/12/2020 echo EF 20-25%, global HK, mild decreased RV function, trivial MR -Discontinue Entresto due to uptrend serum creatinine -cardiology consultation appreciated -hydralazine/imdur added 10/26 and will follow dosage/changes as per cardiology service. -remains clinically fluid overloaded on exam -planning to continue with some diuresis once rate/rhythm better control after cardioversion and further improvement in renal function achieved.  Atrial fibrillation with RVR--paroxysmal -Continue amiodarone drip -07/06/2020 TSH 1.242 -Continue apixaban -planning DCCV cardioversion later today 10/27 -Cardizem has been discontinued in the setting of low EF. -Continue metoprolol succinate.  Acute respiratory Failure with Hypoxia -initially 89% on RA -stable on 2L -due to pulmonary edema -At this moment oxygen saturation within normal limits on room air; patient expressed improvement in his breathing.  Acute on chronic CKD stage IIIb -Baseline creatinine 1.4-1.6 -uptrend serum creatinine due to IV lasix -Continue to follow closely patient's renal function in the setting of acute diuresis.  Stage IVa follicular lymphoma -Actively seen Dr. Delton Coombes -Currently under observation -06/23/2020 PET scan shows hypermetabolic lymph nodes in the neck, axillary region, and small bowel mesentery nodes.  Hypermetabolic lesions in the spine, pelvis, and bilateral hips -Continue outpatient follow-up with oncology service.  Essential hypertension -Continue metoprolol succinate -hydralazine and imdur added -Follow vital signs and adjust medication as needed. -Heart healthy diet/low-sodium discussed with patient.   Status is: Inpatient  Remains inpatient appropriate because:IV treatments  appropriate due to intensity of illness or inability to take PO   Dispo:  The patient is from: Home              Anticipated d/c is to: Home              Anticipated d/c date is: 2 days or sole; will follow a stabilization in his CHF/volume status and also write a/breathing controlled.              Patient currently is not medically stable to d/c. Continue IV amiodarone, anticipated cardioversion later today; with subsequent diuresis as he is still fluid overloaded on examination.   Family Communication:   No family at bedside; patient expressed that he will update his family members.  Consultants:  cardiology  Code Status:  FULL   DVT Prophylaxis:  apixaban   Procedures: As Listed in Progress Note Above  Antibiotics: None   Subjective: Patient is afebrile, denies chest pain. Reports still having some intermittent nonproductive coughing spells and shortness of breath with activity; no frank orthopnea and overall reports improvement in his breathing.  Objective: Vitals:   07/16/20 0400 07/16/20 0500 07/16/20 0600 07/16/20 0700  BP: (!) 114/100 117/84 108/83 (!) 135/101  Pulse: (!) 121 96 88 (!) 57  Resp: 19 (!) 21 (!) 21 17  Temp:      TempSrc:      SpO2: 97% 90% 91% 94%  Weight:      Height:        Intake/Output Summary (Last 24 hours) at 07/16/2020 0753 Last data filed at 07/15/2020 2300 Gross per 24 hour  Intake 652.95 ml  Output 325 ml  Net 327.95 ml   Weight change:   Exam: General exam: Alert, awake, oriented x 3; no chest pain, no nausea, no vomiting. Still short of breath with activity and with writing/breathing on telemetry. Respiratory system: Decreased breath sounds at the bases, no wheezing, no using accessory muscle. Good O2 sat on room air at this time.  Cardiovascular system: Irregular; no rubs, no gallops, no JVD on exam. Gastrointestinal system: Abdomen is nondistended, soft and nontender. No organomegaly or masses felt. Normal bowel sounds  heard. Central nervous system: Alert and oriented. No focal neurological deficits. Extremities: No cyanosis or clubbing; 1+ edema appreciated bilaterally. Skin: No rashes, no petechiae. Psychiatry: Judgement and insight appear normal. Mood & affect appropriate.    Data Reviewed: I have personally reviewed following labs and imaging studies  Basic Metabolic Panel: Recent Labs  Lab 07/14/20 1031 07/15/20 0343 07/16/20 0441  NA 137 138 137  K 4.3 5.4* 4.7  CL 107 107 106  CO2 21* 21* 20*  GLUCOSE 146* 123* 98  BUN 36* 42* 47*  CREATININE 1.64* 2.06* 1.91*  CALCIUM 8.5* 8.6* 8.1*  MG  --  2.0  --    Liver Function Tests: Recent Labs  Lab 07/14/20 1031  AST 53*  ALT 104*  ALKPHOS 53  BILITOT 2.0*  PROT 6.2*  ALBUMIN 3.1*   Coagulation Profile: Recent Labs  Lab 07/14/20 1031  INR 2.0*   CBC: Recent Labs  Lab 07/14/20 1031  WBC 9.4  NEUTROABS 7.3  HGB 13.8  HCT 45.2  MCV 91.3  PLT 300   CBG: Recent Labs  Lab 07/14/20 1958  GLUCAP 140*   Urine analysis:    Component Value Date/Time   COLORURINE YELLOW 09/22/2018 1009   APPEARANCEUR Clear 06/10/2020 0851   LABSPEC 1.012 09/22/2018 1009   PHURINE 6.0 09/22/2018 1009   GLUCOSEU Negative 06/10/2020 0851   HGBUR SMALL (A) 09/22/2018 1009  BILIRUBINUR Negative 06/10/2020 0851   KETONESUR trace (5) (A) 04/09/2020 1050   KETONESUR NEGATIVE 09/22/2018 1009   PROTEINUR 1+ (A) 06/10/2020 0851   PROTEINUR NEGATIVE 09/22/2018 1009   UROBILINOGEN 2.0 (A) 04/09/2020 1050   NITRITE Negative 06/10/2020 0851   NITRITE NEGATIVE 09/22/2018 1009   LEUKOCYTESUR Negative 06/10/2020 0851   Sepsis Labs:  Recent Results (from the past 240 hour(s))  Respiratory Panel by RT PCR (Flu A&B, Covid) - Nasopharyngeal Swab     Status: None   Collection Time: 07/06/20 10:15 AM   Specimen: Nasopharyngeal Swab  Result Value Ref Range Status   SARS Coronavirus 2 by RT PCR NEGATIVE NEGATIVE Final    Comment: (NOTE) SARS-CoV-2  target nucleic acids are NOT DETECTED.  The SARS-CoV-2 RNA is generally detectable in upper respiratoy specimens during the acute phase of infection. The lowest concentration of SARS-CoV-2 viral copies this assay can detect is 131 copies/mL. A negative result does not preclude SARS-Cov-2 infection and should not be used as the sole basis for treatment or other patient management decisions. A negative result may occur with  improper specimen collection/handling, submission of specimen other than nasopharyngeal swab, presence of viral mutation(s) within the areas targeted by this assay, and inadequate number of viral copies (<131 copies/mL). A negative result must be combined with clinical observations, patient history, and epidemiological information. The expected result is Negative.  Fact Sheet for Patients:  PinkCheek.be  Fact Sheet for Healthcare Providers:  GravelBags.it  This test is no t yet approved or cleared by the [place] FDA and  has been authorized for detection and/or diagnosis of SARS-CoV-2 by FDA under an Emergency Use Authorization (EUA). This EUA will remain  in effect (meaning this test can be used) for the duration of the COVID-19 declaration under Section 564(b)(1) of the Act, 21 U.S.C. section 360bbb-3(b)(1), unless the authorization is terminated or revoked sooner.     Influenza A by PCR NEGATIVE NEGATIVE Final   Influenza B by PCR NEGATIVE NEGATIVE Final    Comment: (NOTE) The Xpert Xpress SARS-CoV-2/FLU/RSV assay is intended as an aid in  the diagnosis of influenza from Nasopharyngeal swab specimens and  should not be used as a sole basis for treatment. Nasal washings and  aspirates are unacceptable for Xpert Xpress SARS-CoV-2/FLU/RSV  testing.  Fact Sheet for Patients: PinkCheek.be  Fact Sheet for Healthcare  Providers: GravelBags.it  This test is not yet approved or cleared by the [place] FDA and  has been authorized for detection and/or diagnosis of SARS-CoV-2 by  FDA under an Emergency Use Authorization (EUA). This EUA will remain  in effect (meaning this test can be used) for the duration of the  Covid-19 declaration under Section 564(b)(1) of the Act, 21  U.S.C. section 360bbb-3(b)(1), unless the authorization is  terminated or revoked. Performed at [facility], [address] [place], [place] [address]   Respiratory Panel by RT PCR (Flu A&B, Covid) - Nasopharyngeal Swab     Status: None   Collection Time: 07/14/20 11:44 AM   Specimen: Nasopharyngeal Swab  Result Value Ref Range Status   SARS Coronavirus 2 by RT PCR NEGATIVE NEGATIVE Final    Comment: (NOTE) SARS-CoV-2 target nucleic acids are NOT DETECTED.  The SARS-CoV-2 RNA is generally detectable in upper respiratoy specimens during the acute phase of infection. The lowest concentration of SARS-CoV-2 viral copies this assay can detect is 131 copies/mL. A negative result does not preclude SARS-Cov-2 infection and should not be used as the  sole basis for treatment or other patient management decisions. A negative result may occur with  improper specimen collection/handling, submission of specimen other than nasopharyngeal swab, presence of viral mutation(s) within the areas targeted by this assay, and inadequate number of viral copies (<131 copies/mL). A negative result must be combined with clinical observations, patient history, and epidemiological information. The expected result is Negative.  Fact Sheet for Patients:  PinkCheek.be  Fact Sheet for Healthcare Providers:  GravelBags.it  This test is no t yet approved or cleared by the [place] FDA and  has been authorized for detection and/or diagnosis of SARS-CoV-2 by FDA  under an Emergency Use Authorization (EUA). This EUA will remain  in effect (meaning this test can be used) for the duration of the COVID-19 declaration under Section 564(b)(1) of the Act, 21 U.S.C. section 360bbb-3(b)(1), unless the authorization is terminated or revoked sooner.     Influenza A by PCR NEGATIVE NEGATIVE Final   Influenza B by PCR NEGATIVE NEGATIVE Final    Comment: (NOTE) The Xpert Xpress SARS-CoV-2/FLU/RSV assay is intended as an aid in  the diagnosis of influenza from Nasopharyngeal swab specimens and  should not be used as a sole basis for treatment. Nasal washings and  aspirates are unacceptable for Xpert Xpress SARS-CoV-2/FLU/RSV  testing.  Fact Sheet for Patients: PinkCheek.be  Fact Sheet for Healthcare Providers: GravelBags.it  This test is not yet approved or cleared by the [place] FDA and  has been authorized for detection and/or diagnosis of SARS-CoV-2 by  FDA under an Emergency Use Authorization (EUA). This EUA will remain  in effect (meaning this test can be used) for the duration of the  Covid-19 declaration under Section 564(b)(1) of the Act, 21  U.S.C. section 360bbb-3(b)(1), unless the authorization is  terminated or revoked. Performed at [facility], [address], [place], [place] [address]   MRSA PCR Screening     Status: None   Collection Time: 07/14/20  2:22 PM   Specimen: Nasal Mucosa; Nasopharyngeal  Result Value Ref Range Status   MRSA by PCR NEGATIVE NEGATIVE Final    Comment:        The GeneXpert MRSA Assay (FDA approved for NASAL specimens only), is one component of a comprehensive MRSA colonization surveillance program. It is not intended to diagnose MRSA infection nor to guide or monitor treatment for MRSA infections. Performed at [facility], [address] [place], [place] [address]      Scheduled Meds: . apixaban  5 mg Oral BID  . benzonatate  200 mg  Oral TID  . Chlorhexidine Gluconate Cloth  6 each Topical Daily  . hydrALAZINE  37.5 mg Oral TID  . isosorbide mononitrate  15 mg Oral Daily  . loratadine  10 mg Oral Daily  . metoprolol succinate  50 mg Oral Daily  . pantoprazole  40 mg Oral Daily  . sodium chloride flush  3 mL Intravenous Q12H   Continuous Infusions: . sodium chloride    . amiodarone 30 mg/hr (07/15/20 2107)  . lactated ringers      Procedures/Studies: NM PET Image Initial (PI) Skull Base To Thigh  Result Date: 06/23/2020 CLINICAL DATA:  Initial treatment strategy for follicular lymphoma. EXAM: NUCLEAR MEDICINE PET SKULL BASE TO THIGH TECHNIQUE: 10.6 mCi F-18 FDG was injected intravenously. Full-ring PET imaging was performed from the skull base to thigh after the radiotracer. CT data was obtained and used for attenuation correction and anatomic localization. Fasting blood glucose: 86 mg/dl COMPARISON:  Chest CT on 05/27/2020 FINDINGS: Mediastinal blood-pool activity (background): SUV max = 2.3 Liver activity (reference): SUV max = 3.3 NECK: Multiple small hypermetabolic lymph nodes are seen in both parotid glands and in levels 2 and 5A bilaterally. Index lymph node on the right at level 2B measures 1.2 cm on image 17/4, and has SUV max of 11.4. Index lymph node along the left at level 2B measures 1.0 cm on image 26/4, and has SUV max of 6.7. Focus of hypermetabolic activity in the left nasopharynx has SUV max of 5.1. Hypermetabolic soft tissue nodule in the subcutaneous tissues of the left posterior scalp measuring 1.8 x 1.1 cm on image 14/4, has SUV max of 10.4. A focus of hypermetabolic activity is also seen in the left thyroid lobe, with SUV max of 5.0. Incidental CT findings:  None. CHEST: Left axillary lymphadenopathy is seen which measures 4.7 x 4.6 cm on image 67/4, with SUV max of 16.8. A sub-cm right axillary lymph node is seen on image 70/4, which has SUV max of 2.7. No hypermetabolic mediastinal or hilar lymph nodes.  Incidental CT findings: Tiny bilateral pleural effusions, without hypermetabolic activity. Moderate to severe cardiomegaly. ABDOMEN/PELVIS: No abnormal hypermetabolic activity within the liver, pancreas, adrenal glands, or spleen. A few sub-cm lymph nodes are seen in the small bowel mesentery, 1 of which is hypermetabolic with SUV max of 4.7. Mild hypermetabolic lymphadenopathy is seen both inguinal regions, with index node on the left measuring 9 mm on image 199/4, with SUV max of 8.8. Incidental CT findings:  None. SKELETON: Multiple hypermetabolic bone lesions are seen throughout the spine and pelvis, and involving both hips, some corresponding to lytic bone lesions seen on CT images. Index hypermetabolic lesion involving the T11 vertebra has SUV max of 15.8. Incidental CT findings:  None. IMPRESSION: Hypermetabolic lymphadenopathy in the neck, axillary regions, small bowel mesentery, and inguinal regions, consistent with lymphoma. (Deauville score 5). Hypermetabolic bone lesions throughout the spine, pelvis, and bilateral hips. Electronically Signed   By: Marlaine Hind M.D.   On: 06/23/2020 16:21   DG Chest Port 1 View  Result Date: 07/14/2020 CLINICAL DATA:  Shortness of breath, cough. EXAM: PORTABLE CHEST 1 VIEW COMPARISON:  July 06, 2020. FINDINGS: Stable cardiomegaly. No pneumothorax is noted. Mild bibasilar interstitial densities are noted concerning for edema or subsegmental atelectasis. Bony thorax is unremarkable. IMPRESSION: Mild bibasilar interstitial densities are noted concerning for edema or subsegmental atelectasis. Electronically Signed   By: Marijo Conception M.D.   On: 07/14/2020 11:00   DG Chest Portable 1 View  Result Date: 07/06/2020 CLINICAL DATA:  Cough, sneezing, sinus irritation EXAM: PORTABLE CHEST 1 VIEW COMPARISON:  CT chest dated 05/27/2020 FINDINGS: Increased interstitial markings in the perihilar lungs, right greater than left, favoring very mild perihilar edema. Mild  right basilar opacity, favoring a small right pleural effusion with associated right basilar atelectasis. No pneumothorax. Cardiomegaly. IMPRESSION: Cardiomegaly with suspected mild perihilar edema and small right pleural effusion. Electronically Signed   By: Julian Hy M.D.   On: 07/06/2020 10:25    Barton Dubois, MD [facility]   07/16/2020, 7:53 AM   LOS: 2 days

## 2020-07-16 NOTE — Progress Notes (Addendum)
Progress Note  Patient Name: Darius Smith Date of Encounter: 07/16/2020  [facility] Cardiologist: Carlyle Dolly, MD   Subjective   Some ongoign cough  Inpatient Medications    Scheduled Meds: . apixaban  5 mg Oral BID  . benzonatate  200 mg Oral TID  . Chlorhexidine Gluconate Cloth  6 each Topical Daily  . hydrALAZINE  37.5 mg Oral TID  . isosorbide mononitrate  15 mg Oral Daily  . loratadine  10 mg Oral Daily  . metoprolol succinate  50 mg Oral Daily  . pantoprazole  40 mg Oral Daily  . sodium chloride flush  3 mL Intravenous Q12H   Continuous Infusions: . sodium chloride    . amiodarone 30 mg/hr (07/16/20 0900)  . lactated ringers     PRN Meds: sodium chloride, acetaminophen, ondansetron (ZOFRAN) IV, sodium chloride flush   Vital Signs    Vitals:   07/16/20 0700 07/16/20 0800 07/16/20 0803 07/16/20 0900  BP: (!) 135/101  116/77 127/90  Pulse: (!) 57 (!) 115 (!) 118 (!) 35  Resp: 17 19 20 16   Temp:  (!) 97.2 F (36.2 C)    TempSrc:  Oral    SpO2: 94% 97% 98% 95%  Weight:      Height:        Intake/Output Summary (Last 24 hours) at 07/16/2020 0913 Last data filed at 07/16/2020 0900 Gross per 24 hour  Intake 565.94 ml  Output 775 ml  Net -209.06 ml   Last 3 Weights 07/14/2020 07/14/2020 07/11/2020  Weight (lbs) 203 lb 11.3 oz 198 lb 206 lb 12.8 oz  Weight (kg) 92.4 kg 89.812 kg 93.804 kg      Telemetry    afib 80s-110s - Personally Reviewed  ECG    n/a - Personally Reviewed  Physical Exam   GEN: No acute distress.   Neck: No JVD Cardiac: irreg, no murmurs, rubs, or gallops.  Respiratory: Clear to auscultation bilaterally. GI: Soft, nontender, non-distended  MS: No edema; No deformity. Neuro:  Nonfocal  Psych: Normal affect   Labs    High Sensitivity Troponin:   Recent Labs  Lab 07/14/20 1031  TROPONINIHS 24*      Chemistry Recent Labs  Lab 07/14/20 1031 07/15/20 0343 07/16/20 0441  NA 137 138 137  K 4.3 5.4* 4.7   CL 107 107 106  CO2 21* 21* 20*  GLUCOSE 146* 123* 98  BUN 36* 42* 47*  CREATININE 1.64* 2.06* 1.91*  CALCIUM 8.5* 8.6* 8.1*  PROT 6.2*  --   --   ALBUMIN 3.1*  --   --   AST 53*  --   --   ALT 104*  --   --   ALKPHOS 53  --   --   BILITOT 2.0*  --   --   GFRNONAA 44* 33* 36*  ANIONGAP 9 10 11      Hematology Recent Labs  Lab 07/14/20 1031  WBC 9.4  RBC 4.95  HGB 13.8  HCT 45.2  MCV 91.3  MCH 27.9  MCHC 30.5  RDW 15.9*  PLT 300    BNP Recent Labs  Lab 07/14/20 1031  BNP 2,694.0*     DDimer No results for input(s): DDIMER in the last 168 hours.   Radiology    DG Chest Port 1 View  Result Date: 07/14/2020 CLINICAL DATA:  Shortness of breath, cough. EXAM: PORTABLE CHEST 1 VIEW COMPARISON:  July 06, 2020. FINDINGS: Stable cardiomegaly. No pneumothorax is noted. Mild  bibasilar interstitial densities are noted concerning for edema or subsegmental atelectasis. Bony thorax is unremarkable. IMPRESSION: Mild bibasilar interstitial densities are noted concerning for edema or subsegmental atelectasis. Electronically Signed   By: Marijo Conception M.D.   On: 07/14/2020 11:00    Cardiac Studies    Patient Profile        Darius Smith a 74 y.o.malewith a hx of PAF and chronic systolic heart failiurewho is being seen today for the evaluation of SOB and tachycardiaat the request of Dr Tat.  Assessment & Plan   1. Acute on chronic systolic HF - initially LV dysfunction noted incidentally on echo 05/2020 done for possible left atrial filling defect noted on CT, had not been symptomatic at that time - recent issues with SOB just last few days. BNP this admit 2000s, CXR with edema.   -incomplete I/Os this admission. Uptrend in Cr, diuretics were held yesterday. Cr trending back down but still elevated, continue to hold lasix today - with elevation in Cr we stopped his entresto, started hydral/imdur. Remains on toprol 50mg  daily - hopefully better diuresis once  heart rates and rhythm is better controlled with cardioversion today   2. PAF - by EKGs was SR Jan 2020 - historically dynamap inaccurate for his rates, requires EKGs. Also misleading as he is typically asymptomatic with elevated rates.  - given significant dysfunction that may be tachy mediated, difficulty controlling with beta blocker alone, avoiding CCB due to systolic dysfunction, renal function and HF limits antiarrhythmic options we have started amiodarone. With renal function also would be reluctant for digoxin at this time.  - aggressive management of rhythm as inpatient as may have tachy mediated CM, if does not convert with amio alone likely plan for DCCV this admission  Remains in afib, plan for cardioversion today. He has not missed any eliquis doses within the last 3 weeks.    3. Stage IV lymphoma - from onc notes just monitoring at this time, in absence of symptoms no indciation for therapy. - I did hear back from oncology, they report his lymphoma is not curable and treatment only helps symptoms. Prognosis can be a few years to a few decades, and they do not see a contraindciation to proceeding with cath in the future.    For questions or updates, please contact [place] Please consult [email or web address] for contact info under        Signed, Carlyle Dolly, MD  07/16/2020, 9:13 AM

## 2020-07-16 NOTE — Anesthesia Postprocedure Evaluation (Signed)
Anesthesia Post Note  Patient: Darius Smith  Procedure(s) Performed: CARDIOVERSION (N/A )  Patient location during evaluation: PACU Anesthesia Type: General Level of consciousness: awake and alert and oriented Pain management: pain level controlled Vital Signs Assessment: post-procedure vital signs reviewed and stable Respiratory status: spontaneous breathing Cardiovascular status: blood pressure returned to baseline and stable Postop Assessment: no apparent nausea or vomiting Anesthetic complications: no   No complications documented.   Last Vitals:  Vitals:   07/16/20 1120 07/16/20 1228  BP: 95/70 (!) 125/103  Pulse: 92 (!) 102  Resp: 19 (!) 23  Temp:  36.4 C  SpO2: 96% 97%    Last Pain:  Vitals:   07/16/20 1228  TempSrc: Oral  PainSc: 0-No pain                 Quintasha Gren

## 2020-07-16 NOTE — Progress Notes (Signed)
Unsuccesful cardioversion, plan would be rate control and continue to load amiodarone. Load IV amio additonal 24 hours, then start oral 400mg  bid x 1 week, 200mg  bid x 2 weeks, then 200 mg daily. Increase toprol to 75mg  daily. Would plan for repeat attempt at cardioversion in 2-3 weeks   Carlyle Dolly MD

## 2020-07-16 NOTE — Anesthesia Preprocedure Evaluation (Signed)
Anesthesia Evaluation  Patient identified by MRN, date of birth, ID band Patient awake    History of Anesthesia Complications Negative for: history of anesthetic complications  Airway Mallampati: II  TM Distance: >3 FB Neck ROM: Full    Dental no notable dental hx.    Pulmonary shortness of breath and with exertion,    Pulmonary exam normal  + wheezing      Cardiovascular hypertension, +CHF (Related to a fib)  Normal cardiovascular exam+ dysrhythmias Atrial Fibrillation  Rhythm:Regular Rate:Normal  EF 25%   Neuro/Psych    GI/Hepatic GERD  ,  Endo/Other    Renal/GU CRFRenal disease     Musculoskeletal  (+) Arthritis , Osteoarthritis,    Abdominal   Peds  Hematology   Anesthesia Other Findings   Reproductive/Obstetrics                            Anesthesia Physical Anesthesia Plan  ASA: IV  Anesthesia Plan: General   Post-op Pain Management:    Induction: Intravenous  PONV Risk Score and Plan:   Airway Management Planned:   Additional Equipment:   Intra-op Plan:   Post-operative Plan: Extubation in OR  Informed Consent: I have reviewed the patients History and Physical, chart, labs and discussed the procedure including the risks, benefits and alternatives for the proposed anesthesia with the patient or authorized representative who has indicated his/her understanding and acceptance.     Dental advisory given  Plan Discussed with: CRNA  Anesthesia Plan Comments:         Anesthesia Quick Evaluation

## 2020-07-16 NOTE — Transfer of Care (Signed)
Immediate Anesthesia Transfer of Care Note  Patient: Darius Smith  Procedure(s) Performed: CARDIOVERSION (N/A )  Patient Location: PACU  Anesthesia Type:General  Level of Consciousness: awake  Airway & Oxygen Therapy: Patient Spontanous Breathing  Post-op Assessment: Report given to RN  Post vital signs: Reviewed and stable  Last Vitals:  Vitals Value Taken Time  BP    Temp    Pulse    Resp    SpO2      Last Pain:  Vitals:   07/16/20 1228  TempSrc: Oral  PainSc: 0-No pain      Patients Stated Pain Goal: 7 (29/56/21 3086)  Complications: No complications documented.

## 2020-07-16 NOTE — CV Procedure (Signed)
CV Procedure Note  Procedure: electrical cardioversion Physician: Dr Carlyle Dolly Indication: Atrial fibrillation  Patient brought to the procedure suite after appropriate consent was obtained. Sedation was achieved with the assistance of anesthesiology, please see there note for details. Defib pads placed in the anterior and posterior positions. Synchronized 200J shock delivered on 3 occasions, not able to convert from afib to sinus rhythm. Cardiopulmonary monitoring performed throughout the procedure, he tolerated well without complications.  Plan would be to rate control, continue amio oral for next few weeks then retry cardioversion   Carlyle Dolly MD

## 2020-07-16 NOTE — Addendum Note (Signed)
Addendum  created 07/16/20 1339 by Ollen Bowl, CRNA   Charge Capture section accepted

## 2020-07-17 DIAGNOSIS — I5023 Acute on chronic systolic (congestive) heart failure: Secondary | ICD-10-CM | POA: Diagnosis not present

## 2020-07-17 DIAGNOSIS — I4819 Other persistent atrial fibrillation: Principal | ICD-10-CM

## 2020-07-17 DIAGNOSIS — C8290 Follicular lymphoma, unspecified, unspecified site: Secondary | ICD-10-CM | POA: Diagnosis not present

## 2020-07-17 DIAGNOSIS — N183 Chronic kidney disease, stage 3 unspecified: Secondary | ICD-10-CM

## 2020-07-17 LAB — MAGNESIUM: Magnesium: 2.2 mg/dL (ref 1.7–2.4)

## 2020-07-17 LAB — BASIC METABOLIC PANEL
Anion gap: 11 (ref 5–15)
BUN: 44 mg/dL — ABNORMAL HIGH (ref 8–23)
CO2: 20 mmol/L — ABNORMAL LOW (ref 22–32)
Calcium: 7.9 mg/dL — ABNORMAL LOW (ref 8.9–10.3)
Chloride: 105 mmol/L (ref 98–111)
Creatinine, Ser: 1.76 mg/dL — ABNORMAL HIGH (ref 0.61–1.24)
GFR, Estimated: 40 mL/min — ABNORMAL LOW (ref >60)
Glucose, Bld: 101 mg/dL — ABNORMAL HIGH (ref 70–99)
Potassium: 3.7 mmol/L (ref 3.5–5.1)
Sodium: 136 mmol/L (ref 135–145)

## 2020-07-17 LAB — BRAIN NATRIURETIC PEPTIDE: B Natriuretic Peptide: 1606 pg/mL — ABNORMAL HIGH (ref 0.0–100.0)

## 2020-07-17 MED ORDER — AMIODARONE HCL 200 MG PO TABS
400.0000 mg | ORAL_TABLET | Freq: Two times a day (BID) | ORAL | Status: DC
  Administered 2020-07-17 – 2020-07-18 (×3): 400 mg via ORAL
  Filled 2020-07-17 (×3): qty 2

## 2020-07-17 MED ORDER — FUROSEMIDE 40 MG PO TABS
40.0000 mg | ORAL_TABLET | Freq: Every day | ORAL | Status: DC
  Administered 2020-07-17 – 2020-07-18 (×2): 40 mg via ORAL
  Filled 2020-07-17 (×2): qty 1

## 2020-07-17 NOTE — Progress Notes (Addendum)
Progress Note  Patient Name: Darius Smith Date of Encounter: 07/17/2020  Primary Cardiologist: Carlyle Dolly, MD   Subjective   No chest pain or palpitations. Asymptomatic with his afib. Says it feels like he has a sunburn on his chest following recent DCCV.   Inpatient Medications    Scheduled Meds: . apixaban  5 mg Oral BID  . benzonatate  200 mg Oral TID  . Chlorhexidine Gluconate Cloth  6 each Topical Daily  . hydrALAZINE  37.5 mg Oral TID  . isosorbide mononitrate  15 mg Oral Daily  . loratadine  10 mg Oral Daily  . metoprolol succinate  75 mg Oral Daily  . pantoprazole  40 mg Oral Daily  . sodium chloride flush  3 mL Intravenous Q12H   Continuous Infusions: . sodium chloride    . amiodarone 30 mg/hr (07/16/20 2239)   PRN Meds: sodium chloride, acetaminophen, ondansetron (ZOFRAN) IV, sodium chloride flush   Vital Signs    Vitals:   07/17/20 0400 07/17/20 0500 07/17/20 0700 07/17/20 0708  BP: (!) 135/93 (!) 133/110 108/85   Pulse: 80 86 96 (!) 38  Resp: 17 (!) 21 17 20   Temp: 97.9 F (36.6 C)   98.1 F (36.7 C)  TempSrc: Oral   Oral  SpO2: (!) 89% 94% 95% 95%  Weight:  93.3 kg    Height:        Intake/Output Summary (Last 24 hours) at 07/17/2020 0848 Last data filed at 07/17/2020 0500 Gross per 24 hour  Intake 1115.33 ml  Output 700 ml  Net 415.33 ml    Last 3 Weights 07/17/2020 07/14/2020 07/14/2020  Weight (lbs) 205 lb 11 oz 203 lb 11.3 oz 198 lb  Weight (kg) 93.3 kg 92.4 kg 89.812 kg      Telemetry    Atrial fibrillation, HR in 80's to 110's. Occasional PVC's. - Personally Reviewed  ECG    Rate-controlled atrial fibrillation, HR 89 and LVH with TWI along the anterior leads.  - Personally Reviewed  Physical Exam   General: Well developed, elderly male appearing in no acute distress. Head: Normocephalic, atraumatic.  Neck: Supple without bruits, JVD not elevated. Lungs:  Resp regular and unlabored, mild rales along bases. Heart:  Irregularly irregular, S1, S2, no S3, S4, or murmur; no rub. Abdomen: Soft, non-tender, non-distended with normoactive bowel sounds. No hepatomegaly. No rebound/guarding. No obvious abdominal masses. Extremities: No clubbing or cyanosis, trace lower extremity edema. Distal pedal pulses are 2+ bilaterally. Neuro: Alert and oriented X 3. Moves all extremities spontaneously. Psych: Normal affect.  Labs    Chemistry Recent Labs  Lab 07/14/20 1031 07/14/20 1031 07/15/20 0343 07/16/20 0441 07/17/20 0440  NA 137   < > 138 137 136  K 4.3   < > 5.4* 4.7 3.7  CL 107   < > 107 106 105  CO2 21*   < > 21* 20* 20*  GLUCOSE 146*   < > 123* 98 101*  BUN 36*   < > 42* 47* 44*  CREATININE 1.64*   < > 2.06* 1.91* 1.76*  CALCIUM 8.5*   < > 8.6* 8.1* 7.9*  PROT 6.2*  --   --   --   --   ALBUMIN 3.1*  --   --   --   --   AST 53*  --   --   --   --   ALT 104*  --   --   --   --  ALKPHOS 53  --   --   --   --   BILITOT 2.0*  --   --   --   --   GFRNONAA 44*   < > 33* 36* 40*  ANIONGAP 9   < > 10 11 11    < > = values in this interval not displayed.     Hematology Recent Labs  Lab 07/14/20 1031  WBC 9.4  RBC 4.95  HGB 13.8  HCT 45.2  MCV 91.3  MCH 27.9  MCHC 30.5  RDW 15.9*  PLT 300    BNP Recent Labs  Lab 07/14/20 1031 07/17/20 0440  BNP 2,694.0* 1,606.0*     Radiology    No results found.  Cardiac Studies   Echocardiogram: 06/12/2020 IMPRESSIONS    1. Left ventricular ejection fraction, by estimation, is 20 to 25%. The  left ventricle has severely decreased function. The left ventricle  demonstrates global hypokinesis. There is mild left ventricular  hypertrophy. Left ventricular diastolic parameters  are indeterminate.  2. Right ventricular systolic function is mildly reduced. The right  ventricular size is moderately enlarged.  3. Left atrial size was severely dilated.  4. Right atrial size was mildly dilated.  5. The mitral valve is normal in structure.  Trivial mitral valve  regurgitation. No evidence of mitral stenosis.  6. The aortic valve was not well visualized. There is mild calcification  of the aortic valve. There is mild thickening of the aortic valve. Aortic  valve regurgitation is not visualized. No aortic stenosis is present.  7. The inferior vena cava is normal in size with greater than 50%  respiratory variability, suggesting right atrial pressure of 3 mmHg.   Patient Profile     74 y.o. male w/ PMH of chronic systolic CHF (EF 81-01% in 05/2020 and gelt to possibly tachycardia-mediated), paroxysmal atrial fibrillation, HTN, Stage 3 CKD and Stage 4 Follicular Lymphoma who presented to [facility] ED on 10/25 for evaluation of worsening dyspnea. Admitted for acute CHF exacerbation.   Assessment & Plan    1. Acute on Chronic Systolic CHF - Echocardiogram last month showed his EF was reduced at 20-25% and felt to possibly be tachycardia-mediated in the setting of atrial fibrillation as he was typically asymptomatic with elevated rates.  - BNP was elevated to 2694 on admission and he was initially receiving IV Lasix 40mg  daily but this was held given his bump in creatinine (peaked at 2.06, improving to 1.76 today). Will plan to start Lasix 40mg  daily. Repeat BMET in AM.  - He was on Entresto prior to admission which was discontinued given his AKI and transitioned to Hydralazine 37.5mg  TID and Imdur 15mg  daily. Was on Toprol-XL 50mg  daily and titrated to 75mg  daily (recieiving 1st dose of this today).   2. Persistent Atrial Fibrillation - He has a history of paroxysmal atrial fibrillation but has been in persistent atrial fibrillation since admission. Was started on IV Amiodarone this admission and underwent DCCV on 07/16/2020 by Dr. Harl Bowie with synchronized shock delivered at 200 J on 3 occasions but remained in atrial fibrillation. Was recommended to continue with IV Amio loading for 24 hours then start PO Amiodarone 400mg  BID x1 week,  200mg  BID for 2 weeks then 200mg  daily with plans for repeat DCCV in 2-3 weeks.  - Toprol-XL was just titrated to 75mg  today. Continue Eliquis 5mg  BID for anticoagulation.   3. Stage 4 Lymphoma  - Undergoing monitoring at this time. By review of notes,  Dr. Harl Bowie did reach out to Oncology and treatment is indicated if symptoms and prognosis can be variable from a few years to a few decades.   4. Acute on Chronic Stage 3 CKD - Baseline creatinine 1.3 - 1.4. Peaked at 2.06 this admission, improved to 1.76 today.   For questions or updates, please contact [place] Please consult [email or web address] for contact info under Cardiology/STEMI.   Arna Medici , PA-C 8:48 AM 07/17/2020 Pager: [phone]   Attending note:  Patient seen and examined.  I reviewed hospital course and consultation note by Dr. Harl Bowie.  I agree with above assessment by Ms. Strader PA-C.  Mr. Griffith is clinically stable this morning, no sense of palpitations or breathlessness at rest.  He underwent attempt at cardioversion for atrial fibrillation yesterday, failed three shocks and remains in atrial fibrillation today.  Heart rate is controlled and he continues on IV amiodarone, Toprol-XL which was just uptitrated, and Eliquis.  Plan at this time is to transition to oral amiodarone and continue load with plan for deferred repeat cardioversion attempt in the next 3 weeks.  Lasix also being transitioned to oral dose.  Satira Sark, M.D., F.A.C.C.

## 2020-07-17 NOTE — Progress Notes (Signed)
PROGRESS NOTE  Darius Smith [name]:[id number] DOB: 05/14/1946 DOA: 07/14/2020 PCP: Perlie Mayo, NP  Brief History:  74 y.o. male with medical history of paroxysmal atrial fibrillation on apixaban, COPD, systolic CHF, follicular lymphoma, hypertension presenting with shortness of breath for the past 2 weeks.  The patient has also been complaining of nonproductive cough and chest congestion.  He came to the emergency department on 07/06/2020 with sore throat, nonproductive cough and nasal congestion.  He was discharged home in stable condition with prednisone.  He states that his breathing did not improve very much.  He continued to have nonproductive cough.  He also describes orthopnea type symptoms.  He denies any worsening lower extremity edema or increasing abdominal girth.  The patient went to see his cardiologist, Dr. Harl Bowie on 07/11/2020.  At that time, the patient was noted to have atrial fibrillation with RVR.  His metoprolol succinate was increased to 50 mg daily.  He was continued on his Entresto.  However, the patient continued to have shortness of breath over the weekend with coughing and orthopnea.  As result, he presented for further evaluation. He denies any fevers, chills, chest pain, headache, nausea, vomiting, diarrhea, abdominal pain, dysuria, hematuria. In the emergency department, the patient was afebrile hemodynamically stable with oxygen saturation 89-93% on room air.  BMP showed a serum creatinine of 1.64.  AST 53, ALT 104, alkaline phosphatase 53, total bilirubin 2.0.  WBC 9.1, hemoglobin 13.8, platelets 360,000.  Chest x-ray showed increased interstitial markings and right lower lobe opacity.  The patient was initially started on diltiazem drip, but this was transitioned to an amiodarone drip given to his low EF.  Cardiology was consulted to assist with management.  Assessment/Plan:  Acute on chronic systolic CHF -Continue to follow daily weights, strict I's and O's  and low-sodium diet. -Patient's acute exacerbation felt to be tachyarrthymia related -He has had 2 doses IV furosemide 10/25-->stopped 10/26 due to uptrend in his serum creatinine -Continue to follow BMP -06/12/2020 echo EF 20-25%, global HK, mild decreased RV function, trivial MR -Discontinue Entresto due to uptrend serum creatinine -cardiology consultation appreciated -hydralazine/imdur added 10/26 and will follow dosage/changes as per cardiology service. -remains clinically fluid overloaded on exam (overall improved). -planning to continue with some diuresis once rate/rhythm better control. -Unsuccessful cardioversion attempt on July 16, 2020; amiodarone dosage has been adjusted and at this time transition to oral regimen as per cardiology recommendations.  Restarted on oral diuretics and will follow response.  Atrial fibrillation with RVR--paroxysmal -Continue amiodarone drip -07/06/2020 TSH 1.242 -Continue apixaban -planning DCCV cardioversion later today 10/27 -Cardizem has been discontinued in the setting of low EF. -Continue metoprolol succinate.  Acute respiratory Failure with Hypoxia -initially 89% on RA -stable on 2L -due to pulmonary edema -At this moment oxygen saturation within normal limits on room air; patient expressed improvement in his breathing.  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 -Following cardiology recommendations started on oral Lasix.  Stage IVa follicular lymphoma -Actively seen Dr. Delton Coombes -Currently under observation -06/23/2020 PET scan shows hypermetabolic lymph nodes in the neck, axillary region, and small bowel mesentery nodes.  Hypermetabolic lesions in the spine, pelvis, and bilateral hips -Continue outpatient follow-up with oncology  service.  Essential hypertension -Continue metoprolol succinate -hydralazine and imdur added -Follow  vital signs and adjust medication as needed. -Heart healthy diet/low-sodium discussed with patient.   Status is: Inpatient   Dispo: The patient is from: Home              Anticipated d/c is to: Home              Anticipated d/c date is:1- 2 days or sole; will follow a stabilization in his CHF/volume status and also breathing controlled.              Patient currently is not medically stable to d/c.  Transition amiodarone and diuretics to oral regimen; follow urine output and overall response into his heart rate with activity.  Hopefully home in the next 1 to 2 days.   Family Communication:   No family at bedside; patient expressed that he will update his family members.  Consultants:  cardiology  Code Status:  FULL   DVT Prophylaxis:  apixaban   Procedures: As Listed in Progress Note Above  Antibiotics: None   Subjective: No chest pain, no nausea, no vomiting.  Reports no palpitations.  Unsuccessful cardioversion on July 16, 2020.  Overall good response to reloading dosage of amiodarone with subsequent 24 hours 3.  Still short of breath with activity and expressing mild orthopnea.  Objective: Vitals:   07/17/20 0900 07/17/20 1000 07/17/20 1147 07/17/20 1400  BP: 121/90 109/73  102/85  Pulse: (!) 58 87 (!) 41 100  Resp: (!) 23 17 (!) 26 17  Temp:   (!) 96.4 F (35.8 C)   TempSrc:   Axillary   SpO2: 96% 96% 96% 96%  Weight:      Height:        Intake/Output Summary (Last 24 hours) at 07/17/2020 1508 Last data filed at 07/17/2020 1419 Gross per 24 hour  Intake 490.05 ml  Output 800 ml  Net -309.95 ml   Weight change:   Exam: General exam: Alert, awake, oriented x 3; reports no chest pain, no palpitations, no nausea, no vomiting.  Patient is afebrile.  Still with some shortness of breath on exertion and mild orthopnea.  Patient with unsuccessful  cardioversion attempt on July 16, 2020. Respiratory system: Decreased breath sounds at the bases, no wheezing, no using accessory muscle.  Good O2 sat on room air at rest. Cardiovascular system: Rate controlled; irregular breathing.  No rubs, no gallops. Gastrointestinal system: Abdomen is nondistended, soft and nontender. No organomegaly or masses felt. Normal bowel sounds heard. Central nervous system: Alert and oriented. No focal neurological deficits. Extremities: Trace to 1+ edema bilaterally.  No cyanosis or clubbing Skin: No rashes, no petechiae. Psychiatry: Judgement and insight appear normal. Mood & affect appropriate.    Data Reviewed: I have personally reviewed following labs and imaging studies  Basic Metabolic Panel: Recent Labs  Lab 07/14/20 1031 07/15/20 0343 07/16/20 0441 07/17/20 0440  NA 137 138 137 136  K 4.3 5.4* 4.7 3.7  CL 107 107 106 105  CO2 21* 21* 20* 20*  GLUCOSE 146* 123* 98 101*  BUN 36* 42* 47* 44*  CREATININE 1.64* 2.06* 1.91* 1.76*  CALCIUM 8.5* 8.6* 8.1* 7.9*  MG  --  2.0  --  2.2   Liver Function Tests: Recent Labs  Lab 07/14/20 1031  AST 53*  ALT 104*  ALKPHOS 53  BILITOT 2.0*  PROT 6.2*  ALBUMIN 3.1*   Coagulation Profile: Recent Labs  Lab 07/14/20 1031  INR 2.0*   CBC: Recent Labs  Lab 07/14/20 1031  WBC 9.4  NEUTROABS 7.3  HGB 13.8  HCT 45.2  MCV 91.3  PLT 300   CBG: Recent Labs  Lab 07/14/20 1958  GLUCAP 140*   Urine analysis:    Component Value Date/Time   COLORURINE YELLOW 09/22/2018 1009   APPEARANCEUR Clear 06/10/2020 0851   LABSPEC 1.012 09/22/2018 1009   PHURINE 6.0 09/22/2018 1009   GLUCOSEU Negative 06/10/2020 0851   HGBUR SMALL (A) 09/22/2018 1009   BILIRUBINUR Negative 06/10/2020 0851   KETONESUR trace (5) (A) 04/09/2020 1050   KETONESUR NEGATIVE 09/22/2018 1009   PROTEINUR 1+ (A) 06/10/2020 0851   PROTEINUR NEGATIVE 09/22/2018 1009   UROBILINOGEN 2.0 (A) 04/09/2020 1050   NITRITE  Negative 06/10/2020 0851   NITRITE NEGATIVE 09/22/2018 1009   LEUKOCYTESUR Negative 06/10/2020 0851   Sepsis Labs:  Recent Results (from the past 240 hour(s))  Respiratory Panel by RT PCR (Flu A&B, Covid) - Nasopharyngeal Swab     Status: None   Collection Time: 07/14/20 11:44 AM   Specimen: Nasopharyngeal Swab  Result Value Ref Range Status   SARS Coronavirus 2 by RT PCR NEGATIVE NEGATIVE Final    Comment: (NOTE) SARS-CoV-2 target nucleic acids are NOT DETECTED.  The SARS-CoV-2 RNA is generally detectable in upper respiratoy specimens during the acute phase of infection. The lowest concentration of SARS-CoV-2 viral copies this assay can detect is 131 copies/mL. A negative result does not preclude SARS-Cov-2 infection and should not be used as the sole basis for treatment or other patient management decisions. A negative result may occur with  improper specimen collection/handling, submission of specimen other than nasopharyngeal swab, presence of viral mutation(s) within the areas targeted by this assay, and inadequate number of viral copies (<131 copies/mL). A negative result must be combined with clinical observations, patient history, and epidemiological information. The expected result is Negative.  Fact Sheet for Patients:  PinkCheek.be  Fact Sheet for Healthcare Providers:  GravelBags.it  This test is no t yet approved or cleared by the [place] FDA and  has been authorized for detection and/or diagnosis of SARS-CoV-2 by FDA under an Emergency Use Authorization (EUA). This EUA will remain  in effect (meaning this test can be used) for the duration of the COVID-19 declaration under Section 564(b)(1) of the Act, 21 U.S.C. section 360bbb-3(b)(1), unless the authorization is terminated or revoked sooner.     Influenza A by PCR NEGATIVE NEGATIVE Final   Influenza B by PCR NEGATIVE NEGATIVE Final    Comment:  (NOTE) The Xpert Xpress SARS-CoV-2/FLU/RSV assay is intended as an aid in  the diagnosis of influenza from Nasopharyngeal swab specimens and  should not be used as a sole basis for treatment. Nasal washings and  aspirates are unacceptable for Xpert Xpress SARS-CoV-2/FLU/RSV  testing.  Fact Sheet for Patients: PinkCheek.be  Fact Sheet for Healthcare Providers: GravelBags.it  This test is not yet approved or cleared by the [place] FDA and  has been authorized for detection and/or diagnosis of SARS-CoV-2 by  FDA under an Emergency Use Authorization (EUA). This EUA will remain  in effect (meaning this test can be used) for the duration of the  Covid-19 declaration under Section 564(b)(1) of the Act, 21  U.S.C. section 360bbb-3(b)(1), unless the authorization is  terminated or revoked. Performed at [facility], [address] [place], [place] [address]   MRSA PCR Screening     Status: None   Collection Time: 07/14/20  2:22 PM   Specimen: Nasal Mucosa; Nasopharyngeal  Result Value  Ref Range Status   MRSA by PCR NEGATIVE NEGATIVE Final    Comment:        The GeneXpert MRSA Assay (FDA approved for NASAL specimens only), is one component of a comprehensive MRSA colonization surveillance program. It is not intended to diagnose MRSA infection nor to guide or monitor treatment for MRSA infections. Performed at [facility], [address] [place], [place] [address]      Scheduled Meds: . amiodarone  400 mg Oral BID  . apixaban  5 mg Oral BID  . benzonatate  200 mg Oral TID  . Chlorhexidine Gluconate Cloth  6 each Topical Daily  . furosemide  40 mg Oral Daily  . hydrALAZINE  37.5 mg Oral TID  . isosorbide mononitrate  15 mg Oral Daily  . loratadine  10 mg Oral Daily  . metoprolol succinate  75 mg Oral Daily  . pantoprazole  40 mg Oral Daily  . sodium chloride flush  3 mL Intravenous Q12H   Continuous  Infusions: . sodium chloride    . amiodarone Stopped (07/17/20 1337)    Procedures/Studies: NM PET Image Initial (PI) Skull Base To Thigh  Result Date: 06/23/2020 CLINICAL DATA:  Initial treatment strategy for follicular lymphoma. EXAM: NUCLEAR MEDICINE PET SKULL BASE TO THIGH TECHNIQUE: 10.6 mCi F-18 FDG was injected intravenously. Full-ring PET imaging was performed from the skull base to thigh after the radiotracer. CT data was obtained and used for attenuation correction and anatomic localization. Fasting blood glucose: 86 mg/dl COMPARISON:  Chest CT on 05/27/2020 FINDINGS: Mediastinal blood-pool activity (background): SUV max = 2.3 Liver activity (reference): SUV max = 3.3 NECK: Multiple small hypermetabolic lymph nodes are seen in both parotid glands and in levels 2 and 5A bilaterally. Index lymph node on the right at level 2B measures 1.2 cm on image 17/4, and has SUV max of 11.4. Index lymph node along the left at level 2B measures 1.0 cm on image 26/4, and has SUV max of 6.7. Focus of hypermetabolic activity in the left nasopharynx has SUV max of 5.1. Hypermetabolic soft tissue nodule in the subcutaneous tissues of the left posterior scalp measuring 1.8 x 1.1 cm on image 14/4, has SUV max of 10.4. A focus of hypermetabolic activity is also seen in the left thyroid lobe, with SUV max of 5.0. Incidental CT findings:  None. CHEST: Left axillary lymphadenopathy is seen which measures 4.7 x 4.6 cm on image 67/4, with SUV max of 16.8. A sub-cm right axillary lymph node is seen on image 70/4, which has SUV max of 2.7. No hypermetabolic mediastinal or hilar lymph nodes. Incidental CT findings: Tiny bilateral pleural effusions, without hypermetabolic activity. Moderate to severe cardiomegaly. ABDOMEN/PELVIS: No abnormal hypermetabolic activity within the liver, pancreas, adrenal glands, or spleen. A few sub-cm lymph nodes are seen in the small bowel mesentery, 1 of which is hypermetabolic with SUV max of  4.7. Mild hypermetabolic lymphadenopathy is seen both inguinal regions, with index node on the left measuring 9 mm on image 199/4, with SUV max of 8.8. Incidental CT findings:  None. SKELETON: Multiple hypermetabolic bone lesions are seen throughout the spine and pelvis, and involving both hips, some corresponding to lytic bone lesions seen on CT images. Index hypermetabolic lesion involving the T11 vertebra has SUV max of 15.8. Incidental CT findings:  None. IMPRESSION: Hypermetabolic lymphadenopathy in the neck, axillary regions, small bowel mesentery, and inguinal regions, consistent with lymphoma. (Deauville score 5). Hypermetabolic bone lesions throughout the spine, pelvis, and bilateral  hips. Electronically Signed   By: Marlaine Hind M.D.   On: 06/23/2020 16:21   DG Chest Port 1 View  Result Date: 07/14/2020 CLINICAL DATA:  Shortness of breath, cough. EXAM: PORTABLE CHEST 1 VIEW COMPARISON:  July 06, 2020. FINDINGS: Stable cardiomegaly. No pneumothorax is noted. Mild bibasilar interstitial densities are noted concerning for edema or subsegmental atelectasis. Bony thorax is unremarkable. IMPRESSION: Mild bibasilar interstitial densities are noted concerning for edema or subsegmental atelectasis. Electronically Signed   By: Marijo Conception M.D.   On: 07/14/2020 11:00   DG Chest Portable 1 View  Result Date: 07/06/2020 CLINICAL DATA:  Cough, sneezing, sinus irritation EXAM: PORTABLE CHEST 1 VIEW COMPARISON:  CT chest dated 05/27/2020 FINDINGS: Increased interstitial markings in the perihilar lungs, right greater than left, favoring very mild perihilar edema. Mild right basilar opacity, favoring a small right pleural effusion with associated right basilar atelectasis. No pneumothorax. Cardiomegaly. IMPRESSION: Cardiomegaly with suspected mild perihilar edema and small right pleural effusion. Electronically Signed   By: Julian Hy M.D.   On: 07/06/2020 10:25    Barton Dubois, MD [facility]   07/17/2020, 3:08 PM   LOS: 3 days

## 2020-07-18 ENCOUNTER — Ambulatory Visit: Payer: Medicare Other

## 2020-07-18 ENCOUNTER — Encounter (HOSPITAL_COMMUNITY): Payer: Self-pay | Admitting: Cardiology

## 2020-07-18 DIAGNOSIS — I4891 Unspecified atrial fibrillation: Secondary | ICD-10-CM | POA: Diagnosis not present

## 2020-07-18 DIAGNOSIS — I5023 Acute on chronic systolic (congestive) heart failure: Secondary | ICD-10-CM | POA: Diagnosis not present

## 2020-07-18 LAB — BASIC METABOLIC PANEL
Anion gap: 10 (ref 5–15)
BUN: 40 mg/dL — ABNORMAL HIGH (ref 8–23)
CO2: 21 mmol/L — ABNORMAL LOW (ref 22–32)
Calcium: 8 mg/dL — ABNORMAL LOW (ref 8.9–10.3)
Chloride: 106 mmol/L (ref 98–111)
Creatinine, Ser: 1.7 mg/dL — ABNORMAL HIGH (ref 0.61–1.24)
GFR, Estimated: 42 mL/min — ABNORMAL LOW (ref >60)
Glucose, Bld: 94 mg/dL (ref 70–99)
Potassium: 3.7 mmol/L (ref 3.5–5.1)
Sodium: 137 mmol/L (ref 135–145)

## 2020-07-18 MED ORDER — METOPROLOL SUCCINATE ER 100 MG PO TB24: 100.0000 mg | ORAL_TABLET | Freq: Every day | ORAL | 2 refills | Status: DC

## 2020-07-18 MED ORDER — FUROSEMIDE 40 MG PO TABS: 40.0000 mg | ORAL_TABLET | Freq: Every day | ORAL | 1 refills | Status: DC

## 2020-07-18 MED ORDER — ISOSORBIDE MONONITRATE ER 30 MG PO TB24
15.0000 mg | ORAL_TABLET | Freq: Every day | ORAL | 1 refills | Status: DC
Start: 2020-07-19 — End: 2020-11-10

## 2020-07-18 MED ORDER — AMIODARONE HCL 200 MG PO TABS
ORAL_TABLET | ORAL | 2 refills | Status: DC
Start: 2020-07-18 — End: 2020-07-30

## 2020-07-18 MED ORDER — HYDRALAZINE HCL 25 MG PO TABS: 25.0000 mg | ORAL_TABLET | Freq: Three times a day (TID) | ORAL | Status: DC

## 2020-07-18 MED ORDER — METOPROLOL SUCCINATE ER 50 MG PO TB24: 100.0000 mg | ORAL_TABLET | Freq: Every day | ORAL | Status: DC

## 2020-07-18 MED ORDER — HYDRALAZINE HCL 25 MG PO TABS
25.0000 mg | ORAL_TABLET | Freq: Three times a day (TID) | ORAL | 1 refills | Status: DC
Start: 2020-07-18 — End: 2020-09-29

## 2020-07-18 NOTE — Progress Notes (Signed)
Progress Note  Patient Name: Darius Smith Date of Encounter: 07/18/2020  [facility] Cardiologist: Carlyle Dolly, MD   Subjective   Feels good this morning, no complaints  Inpatient Medications    Scheduled Meds: . amiodarone  400 mg Oral BID  . apixaban  5 mg Oral BID  . benzonatate  200 mg Oral TID  . Chlorhexidine Gluconate Cloth  6 each Topical Daily  . furosemide  40 mg Oral Daily  . hydrALAZINE  37.5 mg Oral TID  . isosorbide mononitrate  15 mg Oral Daily  . loratadine  10 mg Oral Daily  . metoprolol succinate  75 mg Oral Daily  . pantoprazole  40 mg Oral Daily  . sodium chloride flush  3 mL Intravenous Q12H   Continuous Infusions: . sodium chloride    . amiodarone Stopped (07/17/20 1337)   PRN Meds: sodium chloride, acetaminophen, ondansetron (ZOFRAN) IV, sodium chloride flush   Vital Signs    Vitals:   07/17/20 1800 07/17/20 2039 07/18/20 0500 07/18/20 0919  BP: 108/75 100/80 100/77 123/90  Pulse:  84 85 95  Resp: (!) 23 18 16 18   Temp:  98.1 F (36.7 C) 97.9 F (36.6 C) 97.8 F (36.6 C)  TempSrc:    Oral  SpO2:  97% 100% 97%  Weight:      Height:        Intake/Output Summary (Last 24 hours) at 07/18/2020 0927 Last data filed at 07/17/2020 2004 Gross per 24 hour  Intake 673.33 ml  Output 750 ml  Net -76.67 ml   Last 3 Weights 07/17/2020 07/14/2020 07/14/2020  Weight (lbs) 205 lb 11 oz 203 lb 11.3 oz 198 lb  Weight (kg) 93.3 kg 92.4 kg 89.812 kg      Telemetry    afib 80-100 - Personally Reviewed  ECG    n/a - Personally Reviewed  Physical Exam   GEN: No acute distress.   Neck: No JVD Cardiac: irreg, no murmurs, rubs, or gallops.  Respiratory: very faint crackles bilateral bases GI: Soft, nontender, non-distended  MS: No edema; No deformity. Neuro:  Nonfocal  Psych: Normal affect   Labs    High Sensitivity Troponin:   Recent Labs  Lab 07/14/20 1031  TROPONINIHS 24*      Chemistry Recent Labs  Lab  07/14/20 1031 07/15/20 0343 07/16/20 0441 07/17/20 0440 07/18/20 0540  NA 137   < > 137 136 137  K 4.3   < > 4.7 3.7 3.7  CL 107   < > 106 105 106  CO2 21*   < > 20* 20* 21*  GLUCOSE 146*   < > 98 101* 94  BUN 36*   < > 47* 44* 40*  CREATININE 1.64*   < > 1.91* 1.76* 1.70*  CALCIUM 8.5*   < > 8.1* 7.9* 8.0*  PROT 6.2*  --   --   --   --   ALBUMIN 3.1*  --   --   --   --   AST 53*  --   --   --   --   ALT 104*  --   --   --   --   ALKPHOS 53  --   --   --   --   BILITOT 2.0*  --   --   --   --   GFRNONAA 44*   < > 36* 40* 42*  ANIONGAP 9   < > 11 11 10    < > =  values in this interval not displayed.     Hematology Recent Labs  Lab 07/14/20 1031  WBC 9.4  RBC 4.95  HGB 13.8  HCT 45.2  MCV 91.3  MCH 27.9  MCHC 30.5  RDW 15.9*  PLT 300    BNP Recent Labs  Lab 07/14/20 1031 07/17/20 0440  BNP 2,694.0* 1,606.0*     DDimer No results for input(s): DDIMER in the last 168 hours.   Radiology    No results found.  Cardiac Studies    Patient Profile      Darius Smith a 74 y.o.malewith a hx of PAF and chronic systolic heart failiurewho is being seen today for the evaluation of SOB and tachycardiaat the request of Dr Tat.   Assessment & Plan   1. Acute on chronic systolic HF - initially LV dysfunction noted incidentally on echo 05/2020 done for possibleleft atrialfilling defect noted on CT, had not been symptomatic at that time - recent issues with SOBjust last few days. BNP this admit 2000s, CXR with edema.   -incomplete I/Os this admission. SIgnificant uptrend in Cr just after first day of attempted IV diuresis.  - started on oral lasix yesterday 40mg  daily, I/Os incomplete yesterday. Weights appear inaccurate. Cr trending down, seems more tolerant of oral diuretic  - with elevation in Cr we stopped his entresto, started hydral/imdur. Toprol increased to 75mg  daily.  - will lower hydral to 25mg  bid, increase toprol to 100mg  daily - pending  renal function at f/u restart entresto    2. PAF - by EKGs was SR Jan 2020 - historically dynamap inaccurate for his rates, requires EKGs. Also misleading as he is typically asymptomatic with elevated rates.  - given significant dysfunction that may be tachy mediated, difficulty controlling with beta blocker alone, avoiding CCB due to systolic dysfunction, renal function and HF limits antiarrhythmic options we have started amiodarone. With renal function also would be reluctant for digoxin at this time.   - unsuccesful attempt at cardioversion this admit. Plan would be to further load with oral amio x 3 weeks and retry - increase toprol to 100mg  daily, lower hydral to 25mg  tid for more room with bp  3. Stage IV lymphoma - from onc notes just monitoring at this time, in absence of symptoms no indciation for therapy. - I did hear back from oncology, they report his lymphoma is not curable and treatment only helps symptoms. Prognosis can be a few years to a few decades, and they do not see a contraindciation to proceeding with cath in the future.   [place] for discharge, we will arrange outaptient f/u in 1-2 weeks, will need BMET at that time.   For questions or updates, please contact [place] Please consult [email or web address] for contact info under        Signed, Carlyle Dolly, MD  07/18/2020, 9:27 AM

## 2020-07-18 NOTE — Plan of Care (Signed)

## 2020-07-18 NOTE — Discharge Summary (Signed)
Physician Discharge Summary  Darius Smith EUM:[id number] DOB: [birth year] DOA: 07/14/2020  PCP: Perlie Mayo, NP  Admit date: 07/14/2020 Discharge date: 07/18/2020  Time spent: 35 minutes  Recommendations for Outpatient Follow-up:  1. Repeat basic metabolic panel to follow twice and renal function 2. Reassess patient volume status and adjust diuretic regimen as required 3. -Follow patient blood pressure and adjust antihypertensive medications   Discharge Diagnoses:  Active Problems:   Essential hypertension   Follicular lymphoma (HCC)   Atrial fibrillation with RVR (HCC)   Acute on chronic systolic CHF (congestive heart failure) ([place])   Acute respiratory failure with hypoxia ([place])   Discharge Condition: Stable and improved.  Discharged home with instruction to follow-up with PCP in 10 days and to follow-up with cardiology service after discharge.  CODE STATUS: Full code  Diet recommendation: Low-sodium/heart healthy diet.  Filed Weights   07/14/20 0956 07/14/20 1416 07/17/20 0500  Weight: 89.8 kg 92.4 kg 93.3 kg    History of present illness:  74 y.o.malewith medical history ofparoxysmal atrial fibrillation on apixaban, COPD, systolic CHF, follicular lymphoma, hypertension presenting with shortness of breath for the past 2 weeks. The patient has also been complaining of 74 nonproductive cough and chest congestion. He came to the emergency department on 07/06/2020 with sore throat, nonproductive cough and nasal congestion. He was discharged home in stable condition with prednisone. He states that his breathing did not improve very much. He continued to have nonproductive cough. He also describes orthopnea type symptoms. He denies any worsening lower extremity edema or increasing abdominal girth. The patient went to see his cardiologist, Dr. Harl Bowie on 07/11/2020. At that time, the patient was noted to have atrial fibrillation with RVR. His metoprolol succinate was  increased to 50 mg daily. He was continued on his Entresto. However, the patient continued to have shortness of breath over the weekend with coughing and orthopnea. As result, he presented for further evaluation. He denies any fevers, chills, chest pain, headache, nausea, vomiting, diarrhea, abdominal pain, dysuria, hematuria. In the emergency department, the patient was afebrile hemodynamically stable with oxygen saturation 89-93% on room air. BMP showed a serum creatinine of 1.64. AST 53, ALT 104, alkaline phosphatase 53, total bilirubin 2.0. WBC 9.1, hemoglobin 13.8, platelets 360,000. Chest x-ray showed increased interstitial markings and right lower lobe opacity. The patient was initially started on diltiazem drip, but this was transitioned to an amiodarone drip given to his low EF. Cardiology was consulted to assist with management.  Hospital Course:  Acute on chronic systolic CHF -Continue to follow daily weights, strict I's and O's and low-sodium diet. -Patient's acute exacerbation felt to be tachyarrthymia related -He has had 2 doses IV furosemide 10/25-->stopped 10/26 due to uptrend in his serum creatinine; Lasix was held at this moment and subsequently restarted orally. -Continue to follow BMP -06/12/2020 echo EF 20-25%, global HK, mild decreased RV function, trivial MR -Discontinue Entresto due to uptrend serum creatinine -cardiology consultation appreciated -hydralazine/imdur were added 10/26 and will follow dosage/changes as per cardiology service. -Volume status improved and stabilized prior to discharge -Patient will be using oral Lasix 40 mg daily -Low-sodium diet and daily weight has been encouraged. -planning to continue with some diuresis once rate/rhythm better control. -Unsuccessful cardioversion attempt on July 16, 2020.  Atrial fibrillation with RVR--paroxysmal -07/06/2020 TSH 1.242 -Continue apixaban for secondary prevention -planning DCCV cardioversion  later today 10/27 -Cardizem has been discontinued in the setting of low EF. -Patient treated with amiodarone drip and subsequently discharge on  amiodarone 400 mg twice a day for 7 days and instructions today and use 200 mg twice a day and to follow-up with cardiology service. -Metoprolol dose has been adjusted to 100 mg daily.  Acute respiratory Failure with Hypoxia -initially 89% on RA -Stabilize after the use of 2 L oxygen supplementation; due to pulmonary edema -After diuresis and a stabilization of his uncontrolled rate/rhythm oxygen saturation in the mid 90s on room air. -Patient discharged home without oxygen supplementation.  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  Stage IVa follicular lymphoma -Actively seen Dr. Delton Coombes -Currently under observation -06/23/2020 PET scan shows hypermetabolic lymph nodes in the neck, axillary region, and small bowel mesentery nodes. Hypermetabolic lesions in the spine, pelvis, and bilateral hips -Continue outpatient follow-up with oncology service.  Essential hypertension -Continue metoprolol succinate and adjusted dose. -hydralazine and imdur added as per cardiology recommendations. -Follow vital signs and adjust medication as needed on follow-up visit. -Heart healthy diet/low-sodium discussed with patient.  Procedures: See below for x-ray reports 2D echo:  Consultations:  Cardiology service  Discharge Exam: Vitals:   07/18/20 0500 07/18/20 0919  BP: 100/77 123/90  Pulse: 85 95  Resp: 16 18  Temp: 97.9 F (36.6 C) 97.8 F (36.6 C)  SpO2: 100% 97%    General: No fever, no chest pain, no nausea, no vomiting.  Patient reports breathing significantly improved and essentially back to baseline.   He denies palpitations and feeling ready to go home. Cardiovascular: Rate controlled; irregular rhythm.  No rubs, no gallops, no JVD. Respiratory: Good air movement bilaterally; no wheezing, decreased breath sounds at the bases appreciated; no frank crackles.  No using accessory muscle.  Good oxygen saturation on room air. Abdomen: Soft, nontender, nondistended, positive bowel sounds. Extremities: Trace edema appreciated bilaterally; no cyanosis or clubbing.  Discharge Instructions   Discharge Instructions    (HEART FAILURE PATIENTS) Call MD:  Anytime you have any of the following symptoms: 1) 3 pound weight gain in 24 hours or 5 pounds in 1 week 2) shortness of breath, with or without a dry hacking cough 3) swelling in the hands, feet or stomach 4) if you have to sleep on extra pillows at night in order to breathe.   Complete by: As directed    Diet - low sodium heart healthy   Complete by: As directed    Discharge instructions   Complete by: As directed    Follow low-sodium diet (less than 2000 mg daily) Maintain adequate hydration The medications are prescribed Follow-up with cardiology as instructed (office will contact you with appointment details; tentatively around November 10) Arrange follow-up with PCP in 10 days     Allergies as of 07/18/2020      Reactions   Augmentin [amoxicillin-pot Clavulanate] Other (See Comments)   Increased liver enzymes Has patient had a PCN reaction causing immediate rash, facial/tongue/throat swelling, SOB or lightheadedness with hypotension: No Has patient had a PCN reaction causing severe rash involving mucus membranes or skin necrosis: No Has patient had a PCN reaction that required hospitalization: No Has patient had a PCN reaction occurring within the last 10 years: No  If all of the above answers are "NO", then may proceed with Cephalosporin use.   Flomax [tamsulosin Hcl] Nausea Only   Dizziness  Pt is on flomax  Medication List     STOP taking these medications   Entresto 24-26 MG Generic drug: sacubitril-valsartan     TAKE these medications   acetaminophen 500 MG tablet Commonly known as: TYLENOL Take 1,000 mg by mouth every 6 (six) hours as needed for moderate pain or headache.   amiodarone 200 MG tablet Commonly known as: PACERONE Take 2 tablets by mouth twice a day for 7 days; then is to use and 1 tablet by mouth twice a day.   cetirizine 10 MG tablet Commonly known as: ZYRTEC Take 1 tablet (10 mg total) by mouth daily as needed for allergies.   Eliquis 5 MG Tabs tablet Generic drug: apixaban TAKE ONE TABLET BY MOUTH 2 TIMES A DAY. What changed: See the new instructions.   furosemide 40 MG tablet Commonly known as: LASIX Take 1 tablet (40 mg total) by mouth daily. Take 20 mg by mouth daily as needed for leg swelling What changed:   medication strength  how much to take  how to take this  when to take this   hydrALAZINE 25 MG tablet Commonly known as: APRESOLINE Take 1 tablet (25 mg total) by mouth 3 (three) times daily.   isosorbide mononitrate 30 MG 24 hr tablet Commonly known as: IMDUR Take 0.5 tablets (15 mg total) by mouth daily. Start taking on: July 19, 2020   metoprolol succinate 100 MG 24 hr tablet Commonly known as: TOPROL-XL Take 1 tablet (100 mg total) by mouth daily. Take with or immediately following a meal. What changed:   medication strength  how much to take   omeprazole 20 MG capsule Commonly known as: PRILOSEC Take 1 capsule (20 mg total) by mouth daily as needed (for heartburn).   Ventolin HFA 108 (90 Base) MCG/ACT inhaler Generic drug: albuterol Inhale 2 puffs into the lungs every 6 (six) hours as needed for wheezing or shortness of breath.      Allergies  Allergen Reactions  . Augmentin [Amoxicillin-Pot Clavulanate] Other (See Comments)    Increased liver enzymes Has patient had a PCN reaction causing immediate rash, facial/tongue/throat swelling,  SOB or lightheadedness with hypotension: No Has patient had a PCN reaction causing severe rash involving mucus membranes or skin necrosis: No Has patient had a PCN reaction that required hospitalization: No Has patient had a PCN reaction occurring within the last 10 years: No  If all of the above answers are "NO", then may proceed with Cephalosporin use.   Marland Kitchen Flomax [Tamsulosin Hcl] Nausea Only    Dizziness  Pt is on flomax    Follow-up Information    Arnoldo Lenis, MD Follow up on 07/30/2020.   Specialty: Cardiology Why: Cardiology Hospital Follow-up on 07/30/2020 at 9:00 AM.  Contact information: [place] [address] [phone]        Perlie Mayo, NP. Schedule an appointment as soon as possible for a visit in 10 day(s).   Specialty: Family Medicine Contact information: [place] [address] [phone]        Arnoldo Lenis, MD .   Specialty: Cardiology Contact information: [place] [address] [phone]               The results of significant diagnostics from this hospitalization (including imaging, microbiology, ancillary and laboratory) are listed below for reference.    Significant Diagnostic Studies: NM PET Image Initial (PI) Skull Base To Thigh  Result Date: 06/23/2020 CLINICAL DATA:  Initial treatment strategy for follicular lymphoma. EXAM: NUCLEAR MEDICINE PET SKULL BASE TO THIGH TECHNIQUE: 10.6 mCi F-18 FDG was injected intravenously. Full-ring PET imaging was performed from the skull base to thigh after the radiotracer. CT data was obtained and used for attenuation correction and anatomic localization. Fasting blood glucose: 86 mg/dl COMPARISON:  Chest CT on 05/27/2020 FINDINGS: Mediastinal blood-pool activity (background): SUV max = 2.3 Liver activity (reference): SUV max = 3.3 NECK: Multiple small hypermetabolic lymph nodes are seen in both parotid glands and in levels 2 and 5A  bilaterally. Index lymph node on the right at level 2B measures 1.2 cm on image 17/4, and has SUV max of 11.4. Index lymph node along the left at level 2B measures 1.0 cm on image 26/4, and has SUV max of 6.7. Focus of hypermetabolic activity in the left nasopharynx has SUV max of 5.1. Hypermetabolic soft tissue nodule in the subcutaneous tissues of the left posterior scalp measuring 1.8 x 1.1 cm on image 14/4, has SUV max of 10.4. A focus of hypermetabolic activity is also seen in the left thyroid lobe, with SUV max of 5.0. Incidental CT findings:  None. CHEST: Left axillary lymphadenopathy is seen which measures 4.7 x 4.6 cm on image 67/4, with SUV max of 16.8. A sub-cm right axillary lymph node is seen on image 70/4, which has SUV max of 2.7. No hypermetabolic mediastinal or hilar lymph nodes. Incidental CT findings: Tiny bilateral pleural effusions, without hypermetabolic activity. Moderate to severe cardiomegaly. ABDOMEN/PELVIS: No abnormal hypermetabolic activity within the liver, pancreas, adrenal glands, or spleen. A few sub-cm lymph nodes are seen in the small bowel mesentery, 1 of which is hypermetabolic with SUV max of 4.7. Mild hypermetabolic lymphadenopathy is seen both inguinal regions, with index node on the left measuring 9 mm on image 199/4, with SUV max of 8.8. Incidental CT findings:  None. SKELETON: Multiple hypermetabolic bone lesions are seen throughout the spine and pelvis, and involving both hips, some corresponding to lytic bone lesions seen on CT images. Index hypermetabolic lesion involving the T11 vertebra has SUV max of 15.8. Incidental CT findings:  None. IMPRESSION: Hypermetabolic lymphadenopathy in the neck, axillary regions, small bowel mesentery, and inguinal regions, consistent with lymphoma. (Deauville score 5). Hypermetabolic bone lesions throughout the spine, pelvis, and bilateral hips. Electronically Signed   By: Marlaine Hind M.D.   On: 06/23/2020 16:21   DG Chest Port 1  View  Result Date: 07/14/2020 CLINICAL DATA:  Shortness of breath, cough. EXAM: PORTABLE CHEST 1 VIEW COMPARISON:  July 06, 2020. FINDINGS: Stable cardiomegaly. No pneumothorax is noted. Mild bibasilar interstitial densities are noted concerning for edema or subsegmental atelectasis. Bony thorax is unremarkable. IMPRESSION: Mild bibasilar interstitial densities are noted concerning for edema or subsegmental atelectasis. Electronically Signed   By: Marijo Conception M.D.   On: 07/14/2020 11:00   DG Chest Portable 1 View  Result Date: 07/06/2020 CLINICAL DATA:  Cough, sneezing, sinus irritation EXAM: PORTABLE CHEST 1 VIEW COMPARISON:  CT chest dated 05/27/2020 FINDINGS: Increased interstitial markings in the perihilar lungs, right greater than left, favoring very mild perihilar edema. Mild right basilar opacity, favoring a small right pleural effusion with associated right basilar atelectasis. No pneumothorax. Cardiomegaly. IMPRESSION: Cardiomegaly with suspected mild perihilar edema and small right pleural effusion. Electronically Signed   By: Julian Hy M.D.   On: 07/06/2020 10:25    Microbiology: Recent Results (from the past 240 hour(s))  Respiratory Panel by RT PCR (Flu A&B, Covid) -  Nasopharyngeal Swab     Status: None   Collection Time: 07/14/20 11:44 AM   Specimen: Nasopharyngeal Swab  Result Value Ref Range Status   SARS Coronavirus 2 by RT PCR NEGATIVE NEGATIVE Final    Comment: (NOTE) SARS-CoV-2 target nucleic acids are NOT DETECTED.  The SARS-CoV-2 RNA is generally detectable in upper respiratoy specimens during the acute phase of infection. The lowest concentration of SARS-CoV-2 viral copies this assay can detect is 131 copies/mL. A negative result does not preclude SARS-Cov-2 infection and should not be used as the sole basis for treatment or other patient management decisions. A negative result may occur with  improper specimen collection/handling, submission of  specimen other than nasopharyngeal swab, presence of viral mutation(s) within the areas targeted by this assay, and inadequate number of viral copies (<131 copies/mL). A negative result must be combined with clinical observations, patient history, and epidemiological information. The expected result is Negative.  Fact Sheet for Patients:  PinkCheek.be  Fact Sheet for Healthcare Providers:  GravelBags.it  This test is no t yet approved or cleared by the [place] FDA and  has been authorized for detection and/or diagnosis of SARS-CoV-2 by FDA under an Emergency Use Authorization (EUA). This EUA will remain  in effect (meaning this test can be used) for the duration of the COVID-19 declaration under Section 564(b)(1) of the Act, 21 U.S.C. section 360bbb-3(b)(1), unless the authorization is terminated or revoked sooner.     Influenza A by PCR NEGATIVE NEGATIVE Final   Influenza B by PCR NEGATIVE NEGATIVE Final    Comment: (NOTE) The Xpert Xpress SARS-CoV-2/FLU/RSV assay is intended as an aid in  the diagnosis of influenza from Nasopharyngeal swab specimens and  should not be used as a sole basis for treatment. Nasal washings and  aspirates are unacceptable for Xpert Xpress SARS-CoV-2/FLU/RSV  testing.  Fact Sheet for Patients: PinkCheek.be  Fact Sheet for Healthcare Providers: GravelBags.it  This test is not yet approved or cleared by the [place] FDA and  has been authorized for detection and/or diagnosis of SARS-CoV-2 by  FDA under an Emergency Use Authorization (EUA). This EUA will remain  in effect (meaning this test can be used) for the duration of the  Covid-19 declaration under Section 564(b)(1) of the Act, 21  U.S.C. section 360bbb-3(b)(1), unless the authorization is  terminated or revoked. Performed at [facility], [address],  [place], [place] [address]   MRSA PCR Screening     Status: None   Collection Time: 07/14/20  2:22 PM   Specimen: Nasal Mucosa; Nasopharyngeal  Result Value Ref Range Status   MRSA by PCR NEGATIVE NEGATIVE Final    Comment:        The GeneXpert MRSA Assay (FDA approved for NASAL specimens only), is one component of a comprehensive MRSA colonization surveillance program. It is not intended to diagnose MRSA infection nor to guide or monitor treatment for MRSA infections. Performed at [facility], [address] [place], [place] [address]      Labs: Basic Metabolic Panel: Recent Labs  Lab 07/14/20 1031 07/15/20 0343 07/16/20 0441 07/17/20 0440 07/18/20 0540  NA 137 138 137 136 137  K 4.3 5.4* 4.7 3.7 3.7  CL 107 107 106 105 106  CO2 21* 21* 20* 20* 21*  GLUCOSE 146* 123* 98 101* 94  BUN 36* 42* 47* 44* 40*  CREATININE 1.64* 2.06* 1.91* 1.76* 1.70*  CALCIUM 8.5* 8.6* 8.1* 7.9* 8.0*  MG  --  2.0  --  2.2  --    Liver Function Tests: Recent Labs  Lab 07/14/20 1031  AST 53*  ALT 104*  ALKPHOS 53  BILITOT 2.0*  PROT 6.2*  ALBUMIN 3.1*   CBC: Recent Labs  Lab 07/14/20 1031  WBC 9.4  NEUTROABS 7.3  HGB 13.8  HCT 45.2  MCV 91.3  PLT 300    BNP (last 3 results) Recent Labs    07/14/20 1031 07/17/20 0440  BNP 2,694.0* 1,606.0*    CBG: Recent Labs  Lab 07/14/20 1958  GLUCAP 140*    Signed:  Barton Dubois MD.  [facility] 07/18/2020, 11:57 AM

## 2020-07-18 NOTE — Progress Notes (Signed)
Nsg Discharge Note  Admit Date:  07/14/2020 Discharge date: 07/18/2020   Darius Smith to be D/C'd Home per MD order.  AVS completed.  Copy for chart, and copy for patient signed, and dated. Removed IV-clean, dry, intact. Reviewed d/c paperwork with patient and wife. Answered all questions. Wheeled stable patient and belongings to main entrance where he was picked up by his wife. Patient/caregiver able to verbalize understanding.  Discharge Medication: Allergies as of 07/18/2020      Reactions   Augmentin [amoxicillin-pot Clavulanate] Other (See Comments)   Increased liver enzymes Has patient had a PCN reaction causing immediate rash, facial/tongue/throat swelling, SOB or lightheadedness with hypotension: No Has patient had a PCN reaction causing severe rash involving mucus membranes or skin necrosis: No Has patient had a PCN reaction that required hospitalization: No Has patient had a PCN reaction occurring within the last 10 years: No  If all of the above answers are "NO", then may proceed with Cephalosporin use.   Flomax [tamsulosin Hcl] Nausea Only   Dizziness  Pt is on flomax      Medication List    STOP taking these medications   Entresto 24-26 MG Generic drug: sacubitril-valsartan     TAKE these medications   acetaminophen 500 MG tablet Commonly known as: TYLENOL Take 1,000 mg by mouth every 6 (six) hours as needed for moderate pain or headache.   amiodarone 200 MG tablet Commonly known as: PACERONE Take 2 tablets by mouth twice a day for 7 days; then is to use and 1 tablet by mouth twice a day.   cetirizine 10 MG tablet Commonly known as: ZYRTEC Take 1 tablet (10 mg total) by mouth daily as needed for allergies.   Eliquis 5 MG Tabs tablet Generic drug: apixaban TAKE ONE TABLET BY MOUTH 2 TIMES A DAY. What changed: See the new instructions.   furosemide 40 MG tablet Commonly known as: LASIX Take 1 tablet (40 mg total) by mouth daily. Take 20 mg by mouth daily  as needed for leg swelling What changed:   medication strength  how much to take  how to take this  when to take this   hydrALAZINE 25 MG tablet Commonly known as: APRESOLINE Take 1 tablet (25 mg total) by mouth 3 (three) times daily.   isosorbide mononitrate 30 MG 24 hr tablet Commonly known as: IMDUR Take 0.5 tablets (15 mg total) by mouth daily. Start taking on: July 19, 2020   metoprolol succinate 100 MG 24 hr tablet Commonly known as: TOPROL-XL Take 1 tablet (100 mg total) by mouth daily. Take with or immediately following a meal. What changed:   medication strength  how much to take   omeprazole 20 MG capsule Commonly known as: PRILOSEC Take 1 capsule (20 mg total) by mouth daily as needed (for heartburn).   Ventolin HFA 108 (90 Base) MCG/ACT inhaler Generic drug: albuterol Inhale 2 puffs into the lungs every 6 (six) hours as needed for wheezing or shortness of breath.       Discharge Assessment: Vitals:   07/18/20 0500 07/18/20 0919  BP: 100/77 123/90  Pulse: 85 95  Resp: 16 18  Temp: 97.9 F (36.6 C) 97.8 F (36.6 C)  SpO2: 100% 97%   Skin clean, dry and intact without evidence of skin break down, no evidence of skin tears noted. IV catheter discontinued intact. Site without signs and symptoms of complications - no redness or edema noted at insertion site, patient denies c/o pain -  only slight tenderness at site.  Dressing with slight pressure applied.  D/c Instructions-Education: Discharge instructions given to patient/family with verbalized understanding. D/c education completed with patient/family including follow up instructions, medication list, d/c activities limitations if indicated, with other d/c instructions as indicated by MD - patient able to verbalize understanding, all questions fully answered. Patient instructed to return to ED, call 911, or call MD for any changes in condition.  Patient escorted via [place], and D/C home via private  auto.  Santa Lighter, RN 07/18/2020 3:19 PM

## 2020-07-18 NOTE — Care Management Important Message (Signed)
Important Message  Patient Details  Name: Darius Smith MRN: [id number] Date of Birth: [birth year]   Medicare Important Message Given:  Yes - Important Message mailed due to current National Emergency     Tommy Medal 07/18/2020, 12:28 PM

## 2020-07-18 NOTE — Plan of Care (Signed)
  Problem: Education: Goal: Knowledge of General Education information will improve Description: Including pain rating scale, medication(s)/side effects and non-pharmacologic comfort measures 07/18/2020 1307 by Santa Lighter, RN Outcome: Progressing 07/18/2020 1306 by Santa Lighter, RN Outcome: Progressing   Problem: Clinical Measurements: Goal: Ability to maintain clinical measurements within normal limits will improve 07/18/2020 1307 by Santa Lighter, RN Outcome: Progressing 07/18/2020 1306 by Santa Lighter, RN Outcome: Progressing Goal: Will remain free from infection Outcome: Progressing Goal: Diagnostic test results will improve Outcome: Progressing Goal: Respiratory complications will improve Outcome: Progressing Goal: Cardiovascular complication will be avoided Outcome: Progressing   Problem: Activity: Goal: Risk for activity intolerance will decrease Outcome: Progressing   Problem: Nutrition: Goal: Adequate nutrition will be maintained Outcome: Progressing   Problem: Coping: Goal: Level of anxiety will decrease Outcome: Progressing   Problem: Elimination: Goal: Will not experience complications related to bowel motility Outcome: Progressing Goal: Will not experience complications related to urinary retention Outcome: Progressing   Problem: Pain Managment: Goal: General experience of comfort will improve Outcome: Progressing   Problem: Safety: Goal: Ability to remain free from injury will improve Outcome: Progressing   Problem: Skin Integrity: Goal: Risk for impaired skin integrity will decrease Outcome: Progressing   Problem: Education: Goal: Ability to demonstrate management of disease process will improve Outcome: Progressing Goal: Ability to verbalize understanding of medication therapies will improve Outcome: Progressing Goal: Individualized Educational Video(s) Outcome: Progressing   Problem: Activity: Goal: Capacity to carry out  activities will improve Outcome: Progressing   Problem: Cardiac: Goal: Ability to achieve and maintain adequate cardiopulmonary perfusion will improve Outcome: Progressing

## 2020-07-21 ENCOUNTER — Telehealth: Payer: Self-pay

## 2020-07-21 NOTE — Telephone Encounter (Signed)
Transition Care Management Follow-up Telephone Call  Date of discharge and from where:07/18/20 [facility]  How have you been since you were released from the hospital? Has been feeling fine but weak  Any questions or concerns? No  Items Reviewed:  Did the pt receive and understand the discharge instructions provided? Yes   Medications obtained and verified? Yes   Other? No   Any new allergies since your discharge? No   Dietary orders reviewed? Yes  Do you have support at home? Yes   Home Care and Equipment/Supplies: Were home health services ordered? not applicable If so, what is the name of the agency?   Has the agency set up a time to come to the patient's home? not applicable Were any new equipment or medical supplies ordered?  No What is the name of the medical supply agency?  Were you able to get the supplies/equipment? not applicable Do you have any questions related to the use of the equipment or supplies? No  Functional Questionnaire: (I = Independent and D = Dependent) ADLs: i  Bathing/Dressing- i  Meal Prep- i  Eating- i  Maintaining continence- i  Transferring/Ambulation- i  Managing Meds- i  Follow up appointments reviewed:   PCP Hospital f/u appt confirmed? Yes  Scheduled to see Jarrett Soho on 07/31/20 @ 11:20.  [facility] f/u appt confirmed? Yes  Scheduled to see Dr.Branch on 11/10 @ 9:40.  Are transportation arrangements needed? No   If their condition worsens, is the pt aware to call PCP or go to the Emergency Dept.? Yes  Was the patient provided with contact information for the PCP's office or ED? Yes  Was to pt encouraged to call back with questions or concerns? Yes

## 2020-07-30 ENCOUNTER — Other Ambulatory Visit (HOSPITAL_COMMUNITY)
Admission: RE | Admit: 2020-07-30 | Discharge: 2020-07-30 | Disposition: A | Discharge status: Home / Self Care | Payer: Medicare Other | Source: Ambulatory Visit | Attending: Cardiology | Admitting: Cardiology

## 2020-07-30 ENCOUNTER — Other Ambulatory Visit: Payer: Self-pay

## 2020-07-30 ENCOUNTER — Ambulatory Visit: Payer: Medicare Other | Admitting: Cardiology

## 2020-07-30 ENCOUNTER — Encounter: Payer: Self-pay | Admitting: Cardiology

## 2020-07-30 ENCOUNTER — Encounter: Payer: Self-pay | Admitting: Family Medicine

## 2020-07-30 ENCOUNTER — Ambulatory Visit (INDEPENDENT_AMBULATORY_CARE_PROVIDER_SITE_OTHER): Payer: Medicare Other | Admitting: Family Medicine

## 2020-07-30 VITALS — BP 118/58 | HR 85 | Temp 98.5°F | Ht 72.0 in | Wt 201.0 lb

## 2020-07-30 VITALS — BP 140/80 | HR 98 | Ht 72.0 in | Wt 201.6 lb

## 2020-07-30 DIAGNOSIS — I2510 Atherosclerotic heart disease of native coronary artery without angina pectoris: Secondary | ICD-10-CM | POA: Diagnosis not present

## 2020-07-30 DIAGNOSIS — E46 Unspecified protein-calorie malnutrition: Secondary | ICD-10-CM | POA: Diagnosis not present

## 2020-07-30 DIAGNOSIS — Z0001 Encounter for general adult medical examination with abnormal findings: Secondary | ICD-10-CM | POA: Diagnosis not present

## 2020-07-30 DIAGNOSIS — Z5181 Encounter for therapeutic drug level monitoring: Secondary | ICD-10-CM | POA: Insufficient documentation

## 2020-07-30 DIAGNOSIS — R069 Unspecified abnormalities of breathing: Secondary | ICD-10-CM | POA: Diagnosis not present

## 2020-07-30 DIAGNOSIS — I5023 Acute on chronic systolic (congestive) heart failure: Secondary | ICD-10-CM | POA: Diagnosis not present

## 2020-07-30 DIAGNOSIS — J811 Chronic pulmonary edema: Secondary | ICD-10-CM | POA: Diagnosis not present

## 2020-07-30 DIAGNOSIS — Z20822 Contact with and (suspected) exposure to covid-19: Secondary | ICD-10-CM | POA: Diagnosis not present

## 2020-07-30 DIAGNOSIS — I4891 Unspecified atrial fibrillation: Secondary | ICD-10-CM | POA: Diagnosis not present

## 2020-07-30 DIAGNOSIS — R0600 Dyspnea, unspecified: Secondary | ICD-10-CM | POA: Diagnosis not present

## 2020-07-30 DIAGNOSIS — R0902 Hypoxemia: Secondary | ICD-10-CM | POA: Diagnosis not present

## 2020-07-30 DIAGNOSIS — I10 Essential (primary) hypertension: Secondary | ICD-10-CM | POA: Diagnosis not present

## 2020-07-30 DIAGNOSIS — Z79899 Other long term (current) drug therapy: Secondary | ICD-10-CM | POA: Diagnosis not present

## 2020-07-30 DIAGNOSIS — Z91128 Patient's intentional underdosing of medication regimen for other reason: Secondary | ICD-10-CM | POA: Diagnosis not present

## 2020-07-30 DIAGNOSIS — I517 Cardiomegaly: Secondary | ICD-10-CM | POA: Diagnosis not present

## 2020-07-30 DIAGNOSIS — Y92009 Unspecified place in unspecified non-institutional (private) residence as the place of occurrence of the external cause: Secondary | ICD-10-CM | POA: Diagnosis not present

## 2020-07-30 DIAGNOSIS — R0689 Other abnormalities of breathing: Secondary | ICD-10-CM | POA: Diagnosis not present

## 2020-07-30 DIAGNOSIS — J9811 Atelectasis: Secondary | ICD-10-CM | POA: Diagnosis not present

## 2020-07-30 DIAGNOSIS — Z515 Encounter for palliative care: Secondary | ICD-10-CM | POA: Diagnosis not present

## 2020-07-30 DIAGNOSIS — J90 Pleural effusion, not elsewhere classified: Secondary | ICD-10-CM | POA: Diagnosis not present

## 2020-07-30 DIAGNOSIS — Z7901 Long term (current) use of anticoagulants: Secondary | ICD-10-CM | POA: Diagnosis not present

## 2020-07-30 DIAGNOSIS — K219 Gastro-esophageal reflux disease without esophagitis: Secondary | ICD-10-CM

## 2020-07-30 DIAGNOSIS — C8258 Diffuse follicle center lymphoma, lymph nodes of multiple sites: Secondary | ICD-10-CM

## 2020-07-30 DIAGNOSIS — M19042 Primary osteoarthritis, left hand: Secondary | ICD-10-CM | POA: Diagnosis present

## 2020-07-30 DIAGNOSIS — I4819 Other persistent atrial fibrillation: Secondary | ICD-10-CM | POA: Insufficient documentation

## 2020-07-30 DIAGNOSIS — J9601 Acute respiratory failure with hypoxia: Secondary | ICD-10-CM | POA: Diagnosis not present

## 2020-07-30 DIAGNOSIS — Z7689 Persons encountering health services in other specified circumstances: Secondary | ICD-10-CM | POA: Diagnosis not present

## 2020-07-30 DIAGNOSIS — F19982 Other psychoactive substance use, unspecified with psychoactive substance-induced sleep disorder: Secondary | ICD-10-CM

## 2020-07-30 DIAGNOSIS — I130 Hypertensive heart and chronic kidney disease with heart failure and stage 1 through stage 4 chronic kidney disease, or unspecified chronic kidney disease: Secondary | ICD-10-CM | POA: Diagnosis not present

## 2020-07-30 DIAGNOSIS — N179 Acute kidney failure, unspecified: Secondary | ICD-10-CM | POA: Diagnosis not present

## 2020-07-30 DIAGNOSIS — N1832 Chronic kidney disease, stage 3b: Secondary | ICD-10-CM | POA: Diagnosis not present

## 2020-07-30 DIAGNOSIS — C3490 Malignant neoplasm of unspecified part of unspecified bronchus or lung: Secondary | ICD-10-CM | POA: Diagnosis not present

## 2020-07-30 DIAGNOSIS — Z9114 Patient's other noncompliance with medication regimen: Secondary | ICD-10-CM | POA: Diagnosis not present

## 2020-07-30 DIAGNOSIS — R7401 Elevation of levels of liver transaminase levels: Secondary | ICD-10-CM | POA: Diagnosis not present

## 2020-07-30 DIAGNOSIS — Z7189 Other specified counseling: Secondary | ICD-10-CM | POA: Diagnosis not present

## 2020-07-30 DIAGNOSIS — Z66 Do not resuscitate: Secondary | ICD-10-CM | POA: Diagnosis not present

## 2020-07-30 DIAGNOSIS — I5022 Chronic systolic (congestive) heart failure: Secondary | ICD-10-CM

## 2020-07-30 DIAGNOSIS — T502X6A Underdosing of carbonic-anhydrase inhibitors, benzothiadiazides and other diuretics, initial encounter: Secondary | ICD-10-CM | POA: Diagnosis present

## 2020-07-30 DIAGNOSIS — K5730 Diverticulosis of large intestine without perforation or abscess without bleeding: Secondary | ICD-10-CM | POA: Diagnosis not present

## 2020-07-30 DIAGNOSIS — Z6825 Body mass index (BMI) 25.0-25.9, adult: Secondary | ICD-10-CM | POA: Diagnosis not present

## 2020-07-30 DIAGNOSIS — M19041 Primary osteoarthritis, right hand: Secondary | ICD-10-CM | POA: Diagnosis present

## 2020-07-30 DIAGNOSIS — I4821 Permanent atrial fibrillation: Secondary | ICD-10-CM | POA: Diagnosis not present

## 2020-07-30 DIAGNOSIS — R0602 Shortness of breath: Secondary | ICD-10-CM | POA: Diagnosis not present

## 2020-07-30 DIAGNOSIS — I272 Pulmonary hypertension, unspecified: Secondary | ICD-10-CM | POA: Diagnosis not present

## 2020-07-30 DIAGNOSIS — C8290 Follicular lymphoma, unspecified, unspecified site: Secondary | ICD-10-CM | POA: Diagnosis not present

## 2020-07-30 DIAGNOSIS — I509 Heart failure, unspecified: Secondary | ICD-10-CM | POA: Diagnosis not present

## 2020-07-30 DIAGNOSIS — R55 Syncope and collapse: Secondary | ICD-10-CM | POA: Diagnosis not present

## 2020-07-30 DIAGNOSIS — C7951 Secondary malignant neoplasm of bone: Secondary | ICD-10-CM | POA: Diagnosis not present

## 2020-07-30 LAB — BASIC METABOLIC PANEL
Anion gap: 9 (ref 5–15)
BUN: 32 mg/dL — ABNORMAL HIGH (ref 8–23)
CO2: 26 mmol/L (ref 22–32)
Calcium: 8.5 mg/dL — ABNORMAL LOW (ref 8.9–10.3)
Chloride: 106 mmol/L (ref 98–111)
Creatinine, Ser: 1.61 mg/dL — ABNORMAL HIGH (ref 0.61–1.24)
GFR, Estimated: 45 mL/min — ABNORMAL LOW (ref >60)
Glucose, Bld: 104 mg/dL — ABNORMAL HIGH (ref 70–99)
Potassium: 4 mmol/L (ref 3.5–5.1)
Sodium: 141 mmol/L (ref 135–145)

## 2020-07-30 LAB — MAGNESIUM: Magnesium: 2.1 mg/dL (ref 1.7–2.4)

## 2020-07-30 MED ORDER — METOPROLOL SUCCINATE ER 50 MG PO TB24: 50.0000 mg | ORAL_TABLET | Freq: Every day | ORAL | 3 refills | Status: DC

## 2020-07-30 MED ORDER — AMIODARONE HCL 200 MG PO TABS: 200.0000 mg | ORAL_TABLET | Freq: Every day | ORAL | 3 refills | Status: DC

## 2020-07-30 MED ORDER — TEMAZEPAM 7.5 MG PO CAPS: 7.5000 mg | ORAL_CAPSULE | Freq: Every evening | ORAL | 0 refills | Status: DC | PRN

## 2020-07-30 NOTE — Assessment & Plan Note (Signed)
Discussed PSA screening (risks/benefits), recommended at least 30 minutes of aerobic activity at least 5 days/week; proper sunscreen use reviewed; healthy diet and alcohol recommendations (less than or equal to 2 drinks/day) reviewed; regular seatbelt use; changing batteries in smoke detectors. Immunization recommendations discussed.  Colonoscopy recommendations reviewed.

## 2020-07-30 NOTE — Assessment & Plan Note (Signed)
Darius Smith is encouraged to maintain a well balanced diet that is low in salt. Controlled, continue current medication regimen.  Additionally, he is also reminded that exercise is beneficial for heart health and control of  Blood pressure. 30-60 minutes daily is recommended-walking was suggested.

## 2020-07-30 NOTE — Progress Notes (Signed)
Clinical Summary Mr. Noone is a 74 y.o.male seen today for follow up of the following medical problems.   1. Acute on chronic systolic HF - recent admission 06/2020 with volume overload. BNP this admit 2000s, CXR with edema.  - diuresis was limited by elevation in Cr - with elevation in Cr we stopped his entresto, started hydral/imdur. - dischargedo n lasix 40mg  daily.   - no SOB or DOE - home weights 197-201 lbs and stable. - some ongoing mild LE edema  2. Persistent afib - dynamaps do not accuratelty measure his HRs, best measured by EKG>  - issues with elevated HRs recently including during recent admission - difficulty controlling with beta blocker alone, avoiding CCB due to systolic dysfunction, renal function and HF limit antiarrhythmic options. During admission we started amiodarone - failed attempt at [facility] 07/16/20, plan to continue oral load and retry in 3 weeks.   - currently on amio 200mg  bid - no palpitations.    3. Stage IV lymphoma - from onc notes just monitoring at this time, in absence of symptoms no indciation for therapy. - I did hear back from oncology, they report his lymphoma is not curable and treatment only helps symptoms. Prognosis can be a few years to a few decades, and they do not see a contraindciation to proceeding with cath in the future.     Past Medical History:  Diagnosis Date  . Arthritis    both hands  . Atrial fibrillation ([place]) 10/28/2017   Dr. Harl Bowie  . Blood in urine    occ  . Bronchitis   . Cancer ([facility])    Basal cell  . Cardiomegaly 10/28/2017  . Complication of anesthesia    Mr. Fricke developed a. fib in recovery after cysto procedure 10/2017 was transferred to [facility] and underwent successful cardioversion  . Diverticulosis of sigmoid colon 08/2017   Noted on colonoscopy  . Dysrhythmia   . External hemorrhoids 08/2017  . GERD (gastroesophageal reflux disease)   . History of colon polyps 08/2017  . History of kidney  stones   . History of rectal bleeding   . History of right inguinal hernia   . Hypertension   . Nephrolithiasis 07/02/2019  . Rectal bleeding 09/02/2017   Added automatically from request for surgery [id number]  . Shortness of breath      Allergies  Allergen Reactions  . Augmentin [Amoxicillin-Pot Clavulanate] Other (See Comments)    Increased liver enzymes Has patient had a PCN reaction causing immediate rash, facial/tongue/throat swelling, SOB or lightheadedness with hypotension: No Has patient had a PCN reaction causing severe rash involving mucus membranes or skin necrosis: No Has patient had a PCN reaction that required hospitalization: No Has patient had a PCN reaction occurring within the last 10 years: No  If all of the above answers are "NO", then may proceed with Cephalosporin use.   Marland Kitchen Flomax [Tamsulosin Hcl] Nausea Only    Dizziness  Pt is on flomax     Current Outpatient Medications  Medication Sig Dispense Refill  . acetaminophen (TYLENOL) 500 MG tablet Take 1,000 mg by mouth every 6 (six) hours as needed for moderate pain or headache.    Marland Kitchen amiodarone (PACERONE) 200 MG tablet Take 2 tablets by mouth twice a day for 7 days; then is to use and 1 tablet by mouth twice a day. 60 tablet 2  . cetirizine (ZYRTEC) 10 MG tablet Take 1 tablet (10 mg total) by mouth daily as needed  for allergies. 90 tablet 2  . ELIQUIS 5 MG TABS tablet TAKE ONE TABLET BY MOUTH 2 TIMES A DAY. (Patient taking differently: Take 5 mg by mouth 2 (two) times daily. ) 60 tablet 6  . furosemide (LASIX) 40 MG tablet Take 1 tablet (40 mg total) by mouth daily. Take 20 mg by mouth daily as needed for leg swelling 30 tablet 1  . hydrALAZINE (APRESOLINE) 25 MG tablet Take 1 tablet (25 mg total) by mouth 3 (three) times daily. 90 tablet 1  . isosorbide mononitrate (IMDUR) 30 MG 24 hr tablet Take 0.5 tablets (15 mg total) by mouth daily. 30 tablet 1  . metoprolol succinate (TOPROL-XL) 100 MG 24 hr tablet Take 1  tablet (100 mg total) by mouth daily. Take with or immediately following a meal. 30 tablet 2  . omeprazole (PRILOSEC) 20 MG capsule Take 1 capsule (20 mg total) by mouth daily as needed (for heartburn). 90 capsule 2  . VENTOLIN HFA 108 (90 Base) MCG/ACT inhaler Inhale 2 puffs into the lungs every 6 (six) hours as needed for wheezing or shortness of breath. 18 g 3   No current facility-administered medications for this visit.     Past Surgical History:  Procedure Laterality Date  . CARDIOVERSION     10/2017  . CARDIOVERSION N/A 07/16/2020   Procedure: CARDIOVERSION;  Surgeon: Arnoldo Lenis, MD;  Location: [facility];  Service: Endoscopy;  Laterality: N/A;  . CHOLECYSTECTOMY    . 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 CLEFT PALATE REPAIR     several from childhood til 74 years old  . COLONOSCOPY  04/28/2011   Procedure: COLONOSCOPY;  Surgeon: Rogene Houston, MD;  Location: [facility];  Service: Endoscopy;  Laterality: N/A;  . COLONOSCOPY N/A 07/18/2014   Procedure: COLONOSCOPY;  Surgeon: Rogene Houston, MD;  Location: [facility];  Service: Endoscopy;  Laterality: N/A;  830  . COLONOSCOPY N/A 09/09/2017   Procedure: COLONOSCOPY;  Surgeon: Rogene Houston, MD;  Location: [facility];  Service: Endoscopy;  Laterality: N/A;  955  . CYSTOSCOPY/URETEROSCOPY/HOLMIUM LASER/STENT PLACEMENT Bilateral 12/06/2017   Procedure: CYSTOSCOPY/RETROGRADE/URETEROSCOPY/HOLMIUM LASER/STENT EXCHANGE;  Surgeon: Festus Aloe, MD;  Location: [facility];  Service: Urology;  Laterality: Bilateral;  ONLY NEEDS 60 MIN  . HERNIA REPAIR    . POLYPECTOMY  09/09/2017   Procedure: POLYPECTOMY;  Surgeon: Rogene Houston, MD;  Location: [facility];  Service: Endoscopy;;  colon  . vocal cord surgery       Allergies  Allergen Reactions  . Augmentin [Amoxicillin-Pot Clavulanate] Other (See Comments)    Increased liver enzymes Has patient had a PCN reaction causing immediate  rash, facial/tongue/throat swelling, SOB or lightheadedness with hypotension: No Has patient had a PCN reaction causing severe rash involving mucus membranes or skin necrosis: No Has patient had a PCN reaction that required hospitalization: No Has patient had a PCN reaction occurring within the last 10 years: No  If all of the above answers are "NO", then may proceed with Cephalosporin use.   Marland Kitchen Flomax [Tamsulosin Hcl] Nausea Only    Dizziness  Pt is on flomax      Family History  Problem Relation Age of Onset  . Breast cancer Mother   . Prostate cancer Father   . COPD Sister      Social History Mr. Winton reports that he has never smoked. He has never used smokeless tobacco. Mr.  Portman reports previous alcohol use of about 7.0 standard drinks of alcohol per week.   Review of Systems CONSTITUTIONAL: No weight loss, fever, chills, weakness or fatigue.  HEENT: Eyes: No visual loss, blurred vision, double vision or yellow sclerae.No hearing loss, sneezing, congestion, runny nose or sore throat.  SKIN: No rash or itching.  CARDIOVASCULAR: per hpi RESPIRATORY: No shortness of breath, cough or sputum.  GASTROINTESTINAL: No anorexia, nausea, vomiting or diarrhea. No abdominal pain or blood.  GENITOURINARY: No burning on urination, no polyuria NEUROLOGICAL: No headache, dizziness, syncope, paralysis, ataxia, numbness or tingling in the extremities. No change in bowel or bladder control.  MUSCULOSKELETAL: No muscle, back pain, joint pain or stiffness.  LYMPHATICS: No enlarged nodes. No history of splenectomy.  PSYCHIATRIC: No history of depression or anxiety.  ENDOCRINOLOGIC: No reports of sweating, cold or heat intolerance. No polyuria or polydipsia.  Marland Kitchen   Physical Examination Today's Vitals   07/30/20 0907  BP: 140/80  Pulse: 98  SpO2: 96%  Weight: 201 lb 9.6 oz (91.4 kg)  Height: 6' (1.829 m)   Body mass index is 27.34 kg/m.  Gen: resting comfortably, no acute  distress HEENT: no scleral icterus, pupils equal round and reactive, no palptable cervical adenopathy,  CV: irreg, no m/r/g, no jvd Resp: Clear to auscultation bilaterally GI: abdomen is soft, non-tender, non-distended, normal bowel sounds, no hepatosplenomegaly MSK: extremities are warm, 1+ bilateral LE edema Skin: warm, no rash Neuro:  no focal deficits Psych: appropriate affect     Assessment and Plan  1. Chronic systolic HF - possibly tachy mediated given recent issues with afib with RVR - weights stable at home and slowly trending down. Does have some LE edema. Repeat labs, pending renal function may further titrate diureticat f/u and potentially restart his entresto - repeat echo in next several weeks once afib controlled, if ongoing dysfunction would need to consider cath - increase toprol to 150mg  daily.   2. Afib - remains in afib by EKG today rates 100 - continue amio 200mg  bid, will lower to 200mg  daily on Nov 19. Failed DCCV during recent admission, retry in next few weeks as amio loads - increase toprol to 150mg  daily.   F/u 2 weeks. Needs ekg then if still in afib would plan dccv.    Arnoldo Lenis, M.D.

## 2020-07-30 NOTE — Patient Instructions (Addendum)
°  HAPPY FALL!  I appreciate the opportunity to provide you with care for your health and wellness. Today we discussed: overall health   Follow up: 6 months or as needed  Labs- needs to have cholesterol and blood levels checked (Dr Harl Bowie didn't check these) fasting needed  No referrals today  I will review sleep meds that are safe with the medications you are on and send on in for you to try.   Have a great Holiday Season :)  Please continue to practice social distancing to keep you, your family, and our community safe.  If you must go out, please wear a mask and practice good handwashing.  It was a pleasure to see you and I look forward to continuing to work together on your health and well-being. Please do not hesitate to call the office if you need care or have questions about your care.  Have a wonderful day and week. With Gratitude, Cherly Beach, DNP, AGNP-BC  HEALTH MAINTENANCE RECOMMENDATIONS:  It is recommended that you get at least 30 minutes of aerobic exercise at least 5 days/week (for weight loss, you may need as much as 60-90 minutes). This can be any activity that gets your heart rate up. This can be divided in 10-15 minute intervals if needed, but try and build up your endurance at least once a week.  Weight bearing exercise is also recommended twice weekly.  Eat a healthy diet with lots of vegetables, fruits and fiber.  "Colorful" foods have a lot of vitamins (ie green vegetables, tomatoes, red peppers, etc).  Limit sweet tea, regular sodas and alcoholic beverages, all of which has a lot of calories and sugar.  Up to 2 alcoholic drinks daily may be beneficial for men (unless trying to lose weight, watch sugars).  Drink a lot of water.  Sunscreen of at least SPF 30 should be used on all sun-exposed parts of the skin when outside between the hours of 10 am and 4 pm (not just when at beach or pool, but even with exercise, golf, tennis, and yard work!)  Use a sunscreen that  says "broad spectrum" so it covers both UVA and UVB rays, and make sure to reapply every 1-2 hours.  Remember to change the batteries in your smoke detectors when changing your clock times in the spring and fall.  Use your seat belt every time you are in a car, and please drive safely and not be distracted with cell phones and texting while driving.

## 2020-07-30 NOTE — Progress Notes (Signed)
Health Maintenance reviewed -  Immunization History  Administered Date(s) Administered  . Influenza Nasal 06/23/2020  . Influenza, High Dose Seasonal PF 05/30/2019  . Influenza-Unspecified 06/21/2017, 07/03/2018, 05/30/2019, 06/23/2020  . PFIZER SARS-COV-2 Vaccination 11/11/2019, 12/05/2019, 07/03/2020  . Pneumococcal Polysaccharide-23 06/04/2019  . Tdap 04/18/2014  . Zoster 06/26/2013   Last colonoscopy: 08/2017 Last PSA: Seen by urology Dentist: partial- dentist yearly  Ophtho: glasses, Jan 2022  Exercise: walking daily- in the yard  Smoker: no  Alcohol Use: no  Other doctors caring for patient include:  Patient Care Team: Perlie Mayo, NP as PCP - General (Family Medicine) Harl Bowie, Alphonse Guild, MD as PCP - Cardiology (Cardiology) Dishmon, Garwin Brothers, RN as Oncology Nurse Navigator (Oncology)  End of Life Discussion:  Patient has a living will and medical power of attorney  Subjective:   HPI  Darius Smith is a 74 y.o. male who presents for annual visit and follow-up on chronic medical conditions.  He has the following concerns: Biggest concern is sleep trouble secondary to being on amiodarone and Toprol.  Spoke to cardiologist about the side effects of these medications.  He reports that the cardiologist said that he talk to [place] about possible medication needs for this.  Has tried over-the-counter agents without much success.  He denies having any changes in chewing or swallowing.  He denies having any changes in bowel or bladder habits.  No blood in urine or stool.  He reports that his memory is good.  He denies having any falls or injuries recently.  He denies having any skin issues.  Wears glasses.  Today in the office he denied having any active chest pain, headaches, cough, shortness of breath outside of his normal shortness of breath which he reports improves once he rests.  It has greatly improved since he was in the hospital a few weeks ago.  He denies having any  dizziness or vision changes.  Denies having any recent sick contacts.  Has had his flu vaccine this season.  Is followed closely by multiple providers including urology and cardiology.  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  Health maintenance measures are up-to-date..  Of note he does have a transition of care visit from his recent hospital stay tomorrow.  Review Of Systems  Review of Systems  Constitutional: Negative.   HENT: Negative.   Eyes: Negative.   Respiratory: Positive for shortness of breath.   Cardiovascular: Negative.   Gastrointestinal: Negative.   Endocrine: Negative.   Genitourinary: Negative.   Musculoskeletal: Negative.   Skin: Negative.   Neurological: Negative.   Psychiatric/Behavioral: Positive for sleep disturbance.    Objective:   PHYSICAL EXAM:  BP (!) 118/58 (BP Location: Right Arm, Patient Position: Sitting, Cuff Size: Normal)   Pulse 85   Temp 98.5 F (36.9 C) (Temporal)   Ht 6' (1.829 m)   Wt 201 lb (91.2 kg)   SpO2 96%   BMI 27.26 kg/m   Physical Exam Vitals and nursing note reviewed.  Constitutional:      Appearance: Normal appearance. He is well-developed, well-groomed and overweight.  HENT:     Head: Normocephalic and atraumatic.     Right Ear: Hearing, tympanic membrane, ear canal and external ear normal.     Left Ear: Hearing, tympanic membrane, ear canal and external ear normal.     Nose: Nose normal.     Mouth/Throat:     Lips: Pink.     Mouth: Mucous  membranes are moist.     Pharynx: Oropharynx is clear. Uvula midline.  Eyes:     General: Lids are normal.        Right eye: No discharge.        Left eye: No discharge.     Extraocular Movements: Extraocular movements intact.     Conjunctiva/sclera: Conjunctivae normal.     Pupils: Pupils are equal, round, and reactive to light.     Comments: Glasses  Cardiovascular:     Rate and Rhythm: Normal rate. Rhythm irregular.     Pulses:  Normal pulses.     Heart sounds: Normal heart sounds.     Comments: Mild edema in lower extremities.  afib noted- rate of 85 Pulmonary:     Effort: Pulmonary effort is normal.     Breath sounds: Decreased breath sounds present.  Abdominal:     General: Bowel sounds are normal. There is no distension.     Palpations: Abdomen is soft.     Tenderness: There is no abdominal tenderness. There is no right CVA tenderness or left CVA tenderness.     Hernia: No hernia is present.  Musculoskeletal:        General: Normal range of motion.     Cervical back: Normal range of motion and neck supple.     Right lower leg: Edema present.     Left lower leg: Edema present.  Skin:    General: Skin is warm and dry.     Capillary Refill: Capillary refill takes less than 2 seconds.  Neurological:     General: No focal deficit present.     Mental Status: He is alert and oriented to person, place, and time.     Cranial Nerves: Cranial nerves are intact.     Sensory: Sensation is intact.     Motor: Motor function is intact.     Coordination: Coordination is intact.     Gait: Gait is intact.     Deep Tendon Reflexes: Reflexes are normal and symmetric.  Psychiatric:        Attention and Perception: Attention and perception normal.        Mood and Affect: Mood and affect normal.        Speech: Speech normal.        Behavior: Behavior normal. Behavior is cooperative.        Thought Content: Thought content normal.        Cognition and Memory: Cognition and memory normal.        Judgment: Judgment normal.      Depression Screening  Depression screen [facility] 2/9 07/31/2020 07/30/2020 07/30/2020 04/09/2020 03/20/2020  Decreased Interest 0 0 0 0 0  Down, Depressed, Hopeless 0 0 0 0 0  PHQ - 2 Score 0 0 0 0 0  Altered sleeping - - - - 0  Tired, decreased energy - - - - 0  Change in appetite - - - - 0  Feeling bad or failure about yourself  - - - - 0  Trouble concentrating - - - - 0  Moving slowly or  fidgety/restless - - - - 0  Suicidal thoughts - - - - 0  PHQ-9 Score - - - - 0  Difficult doing work/chores - - - - Not difficult at all     Falls  Fall Risk  07/31/2020 07/30/2020 04/09/2020 03/20/2020 01/01/2020  Falls in the past year? 0 0 0 0 0  Number falls in past yr: 0  0 0 0 0  Injury with Fall? 0 0 0 0 0  Risk for fall due to : No Fall Risks No Fall Risks No Fall Risks No Fall Risks -  Follow up Falls evaluation completed Falls evaluation completed Falls evaluation completed Falls evaluation completed Falls evaluation completed    Assessment & Plan:   1. Annual visit for general adult medical examination with abnormal findings   2. Atrial fibrillation, unspecified type ([place])   3. 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 Gastroesophageal reflux disease without esophagitis   6. Essential hypertension     Tests ordered Orders Placed This Encounter  Procedures  . CBC  . Lipid panel    Plan: Please see assessment and plan per problem list above.   Meds ordered this encounter  Medications  . temazepam (RESTORIL) 7.5 MG capsule    Sig: Take 1 capsule (7.5 mg total) by mouth at bedtime as needed for sleep.    Dispense:  30 capsule    Refill:  0    Order Specific Question:   Supervising Provider    Answer:   Fayrene Helper [[id number]]    Attestation I have personally reviewed: The patient's medical and social history Their use of alcohol, tobacco or illicit drugs Their current medications and supplements The patient's functional ability including ADLs,fall risks, home safety risks, cognitive, and hearing and visual impairment Diet and physical activities Evidence for depression or mood disorders  The patient's weight, height, BMI, and visual acuity have been recorded in the chart.  I have made referrals, counseling, and provided education to the patient based on review of the above and I have  provided the patient with a written personalized care plan for preventive services.    Note: This dictation was prepared with Dragon dictation along with smaller phrase technology. Similar sounding words can be transcribed inadequately or may not be corrected upon review. Any transcriptional errors that result from this process are unintentional.      Perlie Mayo, NP   08/01/2020

## 2020-07-30 NOTE — Assessment & Plan Note (Signed)
Noted A. fib during exam today.  Followed closely by cardiology.  Recent issues with RVR.  Medication adjustments have been made.  Advised for him to follow-up with cardiology and continue medication as prescribed he does have 1 issue with insomnia related to his A. fib medications.  Otherwise doing well.

## 2020-07-30 NOTE — Assessment & Plan Note (Signed)
Denies having any heartburn at this time.

## 2020-07-30 NOTE — Patient Instructions (Signed)
Medication Instructions:  Your physician has recommended you make the following change in your medication:   Increase Toprol XL to 150 mg Daily  May Decrease Amiodarone to 200 mg Daily on August 08, 2020.   *If you need a refill on your cardiac medications before your next appointment, please call your pharmacy*   Lab Work: Your physician recommends that you return for lab work in: Today   If you have labs (blood work) drawn today and your tests are completely normal, you will receive your results only by:  MyChart Message (if you have MyChart) OR  A paper copy in the mail If you have any lab test that is abnormal or we need to change your treatment, we will call you to review the results.   Testing/Procedures: NONE    Follow-Up: At [facility] ([facility]), you and your health needs are our priority.  As part of our continuing mission to provide you with exceptional heart care, we have created designated Provider Care Teams.  These Care Teams include your primary Cardiologist (physician) and Advanced Practice Providers (APPs -  Physician Assistants and Nurse Practitioners) who all work together to provide you with the care you need, when you need it.  We recommend signing up for the patient portal called "MyChart".  Sign up information is provided on this After Visit Summary.  MyChart is used to connect with patients for Virtual Visits (Telemedicine).  Patients are able to view lab/test results, encounter notes, upcoming appointments, etc.  Non-urgent messages can be sent to your provider as well.   To learn more about what you can do with MyChart, go to [email or web address].    Your next appointment:   2 week(s)  The format for your next appointment:   In Person  Provider:   Carlyle Dolly, MD   Other Instructions Thank you for choosing [facility]!

## 2020-07-31 ENCOUNTER — Ambulatory Visit (INDEPENDENT_AMBULATORY_CARE_PROVIDER_SITE_OTHER): Payer: Medicare Other | Admitting: Family Medicine

## 2020-07-31 ENCOUNTER — Other Ambulatory Visit: Payer: Self-pay

## 2020-07-31 ENCOUNTER — Inpatient Hospital Stay (HOSPITAL_COMMUNITY)
Admission: EM | Admit: 2020-07-31 | Discharge: 2020-08-05 | DRG: 291 | Disposition: A | Discharge status: Home Health | Payer: Medicare Other | Attending: Family Medicine | Admitting: Family Medicine

## 2020-07-31 ENCOUNTER — Encounter (HOSPITAL_COMMUNITY): Payer: Self-pay | Admitting: Emergency Medicine

## 2020-07-31 ENCOUNTER — Encounter: Payer: Self-pay | Admitting: Family Medicine

## 2020-07-31 ENCOUNTER — Emergency Department (HOSPITAL_COMMUNITY): Payer: Medicare Other

## 2020-07-31 VITALS — BP 138/78 | HR 92 | Temp 98.3°F | Ht 72.0 in | Wt 201.0 lb

## 2020-07-31 DIAGNOSIS — Z85118 Personal history of other malignant neoplasm of bronchus and lung: Secondary | ICD-10-CM

## 2020-07-31 DIAGNOSIS — Z87442 Personal history of urinary calculi: Secondary | ICD-10-CM

## 2020-07-31 DIAGNOSIS — J90 Pleural effusion, not elsewhere classified: Secondary | ICD-10-CM

## 2020-07-31 DIAGNOSIS — I2510 Atherosclerotic heart disease of native coronary artery without angina pectoris: Secondary | ICD-10-CM | POA: Diagnosis present

## 2020-07-31 DIAGNOSIS — Z91128 Patient's intentional underdosing of medication regimen for other reason: Secondary | ICD-10-CM

## 2020-07-31 DIAGNOSIS — N1832 Chronic kidney disease, stage 3b: Secondary | ICD-10-CM | POA: Diagnosis present

## 2020-07-31 DIAGNOSIS — Z7189 Other specified counseling: Secondary | ICD-10-CM | POA: Diagnosis not present

## 2020-07-31 DIAGNOSIS — Z515 Encounter for palliative care: Secondary | ICD-10-CM

## 2020-07-31 DIAGNOSIS — J9601 Acute respiratory failure with hypoxia: Secondary | ICD-10-CM | POA: Diagnosis present

## 2020-07-31 DIAGNOSIS — N179 Acute kidney failure, unspecified: Secondary | ICD-10-CM | POA: Diagnosis present

## 2020-07-31 DIAGNOSIS — I4821 Permanent atrial fibrillation: Secondary | ICD-10-CM

## 2020-07-31 DIAGNOSIS — Y92009 Unspecified place in unspecified non-institutional (private) residence as the place of occurrence of the external cause: Secondary | ICD-10-CM | POA: Diagnosis not present

## 2020-07-31 DIAGNOSIS — E46 Unspecified protein-calorie malnutrition: Secondary | ICD-10-CM | POA: Diagnosis present

## 2020-07-31 DIAGNOSIS — C8258 Diffuse follicle center lymphoma, lymph nodes of multiple sites: Secondary | ICD-10-CM | POA: Diagnosis not present

## 2020-07-31 DIAGNOSIS — Z7901 Long term (current) use of anticoagulants: Secondary | ICD-10-CM | POA: Diagnosis not present

## 2020-07-31 DIAGNOSIS — M19042 Primary osteoarthritis, left hand: Secondary | ICD-10-CM | POA: Diagnosis present

## 2020-07-31 DIAGNOSIS — Z66 Do not resuscitate: Secondary | ICD-10-CM | POA: Diagnosis present

## 2020-07-31 DIAGNOSIS — T502X6A Underdosing of carbonic-anhydrase inhibitors, benzothiadiazides and other diuretics, initial encounter: Secondary | ICD-10-CM | POA: Diagnosis present

## 2020-07-31 DIAGNOSIS — C8290 Follicular lymphoma, unspecified, unspecified site: Secondary | ICD-10-CM | POA: Diagnosis present

## 2020-07-31 DIAGNOSIS — I5023 Acute on chronic systolic (congestive) heart failure: Secondary | ICD-10-CM | POA: Diagnosis present

## 2020-07-31 DIAGNOSIS — I130 Hypertensive heart and chronic kidney disease with heart failure and stage 1 through stage 4 chronic kidney disease, or unspecified chronic kidney disease: Secondary | ICD-10-CM | POA: Diagnosis present

## 2020-07-31 DIAGNOSIS — C7951 Secondary malignant neoplasm of bone: Secondary | ICD-10-CM | POA: Diagnosis present

## 2020-07-31 DIAGNOSIS — R7401 Elevation of levels of liver transaminase levels: Secondary | ICD-10-CM

## 2020-07-31 DIAGNOSIS — Z6825 Body mass index (BMI) 25.0-25.9, adult: Secondary | ICD-10-CM

## 2020-07-31 DIAGNOSIS — K219 Gastro-esophageal reflux disease without esophagitis: Secondary | ICD-10-CM | POA: Diagnosis present

## 2020-07-31 DIAGNOSIS — I4891 Unspecified atrial fibrillation: Secondary | ICD-10-CM | POA: Diagnosis present

## 2020-07-31 DIAGNOSIS — M19041 Primary osteoarthritis, right hand: Secondary | ICD-10-CM | POA: Diagnosis present

## 2020-07-31 DIAGNOSIS — I10 Essential (primary) hypertension: Secondary | ICD-10-CM | POA: Diagnosis not present

## 2020-07-31 DIAGNOSIS — C8590 Non-Hodgkin lymphoma, unspecified, unspecified site: Secondary | ICD-10-CM | POA: Diagnosis present

## 2020-07-31 DIAGNOSIS — I5042 Chronic combined systolic (congestive) and diastolic (congestive) heart failure: Secondary | ICD-10-CM | POA: Diagnosis present

## 2020-07-31 DIAGNOSIS — C3490 Malignant neoplasm of unspecified part of unspecified bronchus or lung: Secondary | ICD-10-CM | POA: Diagnosis present

## 2020-07-31 DIAGNOSIS — Z7689 Persons encountering health services in other specified circumstances: Secondary | ICD-10-CM | POA: Insufficient documentation

## 2020-07-31 DIAGNOSIS — Z9114 Patient's other noncompliance with medication regimen: Secondary | ICD-10-CM

## 2020-07-31 DIAGNOSIS — I509 Heart failure, unspecified: Secondary | ICD-10-CM

## 2020-07-31 DIAGNOSIS — K5730 Diverticulosis of large intestine without perforation or abscess without bleeding: Secondary | ICD-10-CM | POA: Diagnosis present

## 2020-07-31 DIAGNOSIS — I272 Pulmonary hypertension, unspecified: Secondary | ICD-10-CM | POA: Diagnosis present

## 2020-07-31 DIAGNOSIS — I5022 Chronic systolic (congestive) heart failure: Secondary | ICD-10-CM | POA: Diagnosis present

## 2020-07-31 DIAGNOSIS — R55 Syncope and collapse: Secondary | ICD-10-CM | POA: Diagnosis present

## 2020-07-31 DIAGNOSIS — Z20822 Contact with and (suspected) exposure to covid-19: Secondary | ICD-10-CM | POA: Diagnosis present

## 2020-07-31 DIAGNOSIS — Z880 Allergy status to penicillin: Secondary | ICD-10-CM

## 2020-07-31 LAB — CBC WITH DIFFERENTIAL/PLATELET
Abs Immature Granulocytes: 0.03 10*3/uL (ref 0.00–0.07)
Basophils Absolute: 0 10*3/uL (ref 0.0–0.1)
Basophils Relative: 0 %
Eosinophils Absolute: 0 10*3/uL (ref 0.0–0.5)
Eosinophils Relative: 0 %
HCT: 41.9 % (ref 39.0–52.0)
Hemoglobin: 13.2 g/dL (ref 13.0–17.0)
Immature Granulocytes: 0 %
Lymphocytes Relative: 5 %
Lymphs Abs: 0.4 10*3/uL — ABNORMAL LOW (ref 0.7–4.0)
MCH: 28.4 pg (ref 26.0–34.0)
MCHC: 31.5 g/dL (ref 30.0–36.0)
MCV: 90.1 fL (ref 80.0–100.0)
Monocytes Absolute: 0.8 10*3/uL (ref 0.1–1.0)
Monocytes Relative: 9 %
Neutro Abs: 7.5 10*3/uL (ref 1.7–7.7)
Neutrophils Relative %: 86 %
Platelets: 207 10*3/uL (ref 150–400)
RBC: 4.65 MIL/uL (ref 4.22–5.81)
RDW: 16.6 % — ABNORMAL HIGH (ref 11.5–15.5)
WBC: 8.8 10*3/uL (ref 4.0–10.5)
nRBC: 0 % (ref 0.0–0.2)

## 2020-07-31 LAB — BLOOD GAS, VENOUS
Acid-base deficit: 1.6 mmol/L (ref 0.0–2.0)
Bicarbonate: 23.7 mmol/L (ref 20.0–28.0)
FIO2: 36
O2 Saturation: 91.7 %
Patient temperature: 39
pCO2, Ven: 33.3 mmHg — ABNORMAL LOW (ref 44.0–60.0)
pH, Ven: 7.439 — ABNORMAL HIGH (ref 7.250–7.430)
pO2, Ven: 69.3 mmHg — ABNORMAL HIGH (ref 32.0–45.0)

## 2020-07-31 LAB — COMPREHENSIVE METABOLIC PANEL
ALT: 87 U/L — ABNORMAL HIGH (ref 0–44)
AST: 78 U/L — ABNORMAL HIGH (ref 15–41)
Albumin: 3.2 g/dL — ABNORMAL LOW (ref 3.5–5.0)
Alkaline Phosphatase: 67 U/L (ref 38–126)
Anion gap: 12 (ref 5–15)
BUN: 33 mg/dL — ABNORMAL HIGH (ref 8–23)
CO2: 19 mmol/L — ABNORMAL LOW (ref 22–32)
Calcium: 8.2 mg/dL — ABNORMAL LOW (ref 8.9–10.3)
Chloride: 103 mmol/L (ref 98–111)
Creatinine, Ser: 2.25 mg/dL — ABNORMAL HIGH (ref 0.61–1.24)
GFR, Estimated: 30 mL/min — ABNORMAL LOW (ref >60)
Glucose, Bld: 124 mg/dL — ABNORMAL HIGH (ref 70–99)
Potassium: 4.3 mmol/L (ref 3.5–5.1)
Sodium: 134 mmol/L — ABNORMAL LOW (ref 135–145)
Total Bilirubin: 1.6 mg/dL — ABNORMAL HIGH (ref 0.3–1.2)
Total Protein: 6.2 g/dL — ABNORMAL LOW (ref 6.5–8.1)

## 2020-07-31 LAB — BRAIN NATRIURETIC PEPTIDE: B Natriuretic Peptide: 25 pg/mL (ref 0.0–100.0)

## 2020-07-31 LAB — RESPIRATORY PANEL BY RT PCR (FLU A&B, COVID)
Influenza A by PCR: NEGATIVE
Influenza B by PCR: NEGATIVE
SARS Coronavirus 2 by RT PCR: NEGATIVE

## 2020-07-31 LAB — TROPONIN I (HIGH SENSITIVITY): Troponin I (High Sensitivity): 60 ng/L — ABNORMAL HIGH (ref <18)

## 2020-07-31 MED ORDER — FUROSEMIDE 10 MG/ML IJ SOLN
40.0000 mg | Freq: Once | INTRAMUSCULAR | Status: AC
  Administered 2020-07-31: 40 mg via INTRAVENOUS
  Filled 2020-07-31: qty 4

## 2020-07-31 MED ORDER — IOHEXOL 350 MG/ML SOLN
75.0000 mL | Freq: Once | INTRAVENOUS | Status: AC | PRN
  Administered 2020-07-31: 75 mL via INTRAVENOUS

## 2020-07-31 MED ORDER — ALBUTEROL SULFATE HFA 108 (90 BASE) MCG/ACT IN AERS
2.0000 | INHALATION_SPRAY | Freq: Once | RESPIRATORY_TRACT | Status: AC
  Administered 2020-07-31: 2 via RESPIRATORY_TRACT
  Filled 2020-07-31: qty 6.7

## 2020-07-31 NOTE — Assessment & Plan Note (Signed)
Noted on physical exam.  Followed closely by cardiology.  Medication appears to be helping but he reports that cardiology would like to see him at a lower rate.  As he does sit in the 90s.  Does have some mild shortness of breath at times but overall he reports that it is much improved.

## 2020-07-31 NOTE — Assessment & Plan Note (Signed)
Repeat labs have been ordered from yesterday.

## 2020-07-31 NOTE — Assessment & Plan Note (Signed)
Extensive review of notes, orders, labs and medication changes.  Had physical yesterday updated labs were ordered at that time.  Will adjust and treat as needed.  Has close follow-up with cardiology.  No other issues or concerns today.

## 2020-07-31 NOTE — ED Notes (Signed)
Pt to CT

## 2020-07-31 NOTE — Progress Notes (Addendum)
Subjective:  Patient ID: Darius Smith, male    DOB: [birth year]  Age: 74 y.o. MRN: [id number]  CC:  Chief Complaint  Patient presents with  . Hospitalization Follow-up      HPI  HPI Darius Smith is a 74 y.o.malewith medical history ofparoxysmal atrial fibrillation on apixaban, COPD, systolic CHF, follicular lymphoma, hypertension, among others as detailed below.  He presented to the ED with persistent shortness of breath for the 2 weeks on 10/25.  He had been complaining of nonproductive cough and chest congestion.  Backstory: He went to the emergency department on 07/06/2020 with sore throat, nonproductive cough and nasal congestion. He was discharged home in stable condition with prednisone. He stated that his breathing did not improve very much. He continued to have nonproductive cough. He also describes orthopnea type symptoms. He denies any worsening lower extremity edema or increasing abdominal girth. The patient went to see his cardiologist, Dr. Harl Bowie on 07/11/2020. At that time, the patient was noted to have atrial fibrillation with RVR. His metoprolol succinate was increased to 50 mg daily. He was continued on his Entresto. However, the patient continued to have shortness of breath over the weekend with coughing and orthopnea. As result, he presented to ED for further evaluation.  In the emergency department, the patient was afebrile hemodynamically stable with oxygen saturation 89-93% on room air. BMP showed a serum creatinine of 1.64. AST 53, ALT 104, alkaline phosphatase 53, total bilirubin 2.0. WBC 9.1, hemoglobin 13.8, platelets 360,000. Chest x-ray showed increased interstitial markings and right lower lobe opacity. The patient was initially started on diltiazem drip, but this was transitioned to an amiodarone drip given to his low EF. Cardiology was consulted to assist with management.  Medications adjusted for diuresing.  Unsuccessful cardioversion on  October 27.  They decrease Entresto while he was inpatient secondary to serum creatinine bumping up secondary to use of Lasix most likely per them.  He is going to be following up with cardiology on the 19th.  He reports that he probably will have some medication adjustments as per his last visit with them.  Noted September EF was 20 to 25%.  Atrial fibrillation is still present today in the office.  But in a controlled range higher end of normal and 90s.  Is on amiodarone as directed.  And Metoprolol.  He did not require any oxygenation secondary to diuresing of fluid.  Did have some kidney changes and therefore needs of BMP to follow renal function trend.  That was ordered yesterday.  Blood pressure stable not having any issues with this.  Hydralazine and Imdur added while inpatient.  He reports he is tolerating these well without any issues.  Today he denies any fevers, chills, chest pain, headache, nausea, vomiting, diarrhea, abdominal pain, dysuria, hematuria. He is feeling much better since leaving the hospital.  Today patient denies signs and symptoms of COVID 19 infection including fever, chills, cough, shortness of breath, and headache. Past Medical, Surgical, Social History, Allergies, and Medications have been Reviewed.   Past Medical History:  Diagnosis Date  . Arthritis    both hands  . Atrial fibrillation ([place]) 10/28/2017   Dr. Harl Bowie  . Atrial fibrillation with RVR ([place]) 07/14/2020  . Blood in urine    occ  . Bronchitis   . Cancer (HCC)    Basal cell  . Cardiomegaly 10/28/2017  . Complication of anesthesia    Darius. Riggenbach developed a. fib in recovery after cysto procedure  10/2017 was transferred to [facility] and underwent successful cardioversion  . Diverticulosis of sigmoid colon 08/2017   Noted on colonoscopy  . Dysrhythmia   . External hemorrhoids 08/2017  . GERD (gastroesophageal reflux disease)   . History of colon polyps 08/2017  . History of kidney stones   . History  of rectal bleeding   . History of right inguinal hernia   . Hypertension   . Nephrolithiasis 07/02/2019  . Rectal bleeding 09/02/2017   Added automatically from request for surgery [id number]  . Shortness of breath     Current Meds  Medication Sig  . acetaminophen (TYLENOL) 500 MG tablet Take 1,000 mg by mouth every 6 (six) hours as needed for moderate pain or headache.  Marland Kitchen amiodarone (PACERONE) 200 MG tablet Take 1 tablet (200 mg total) by mouth daily.  . cetirizine (ZYRTEC) 10 MG tablet Take 1 tablet (10 mg total) by mouth daily as needed for allergies.  Marland Kitchen ELIQUIS 5 MG TABS tablet TAKE ONE TABLET BY MOUTH 2 TIMES A DAY. (Patient taking differently: Take 5 mg by mouth 2 (two) times daily. )  . furosemide (LASIX) 40 MG tablet Take 1 tablet (40 mg total) by mouth daily. Take 20 mg by mouth daily as needed for leg swelling  . hydrALAZINE (APRESOLINE) 25 MG tablet Take 1 tablet (25 mg total) by mouth 3 (three) times daily.  . isosorbide mononitrate (IMDUR) 30 MG 24 hr tablet Take 0.5 tablets (15 mg total) by mouth daily.  . metoprolol succinate (TOPROL-XL) 100 MG 24 hr tablet Take 1 tablet (100 mg total) by mouth daily. Take with or immediately following a meal.  . metoprolol succinate (TOPROL-XL) 50 MG 24 hr tablet Take 1 tablet (50 mg total) by mouth daily. Take with or immediately following a meal. Take with Toprol XL 100 mg to equal 150 mg Daily  . temazepam (RESTORIL) 7.5 MG capsule Take 1 capsule (7.5 mg total) by mouth at bedtime as needed for sleep.  . VENTOLIN HFA 108 (90 Base) MCG/ACT inhaler Inhale 2 puffs into the lungs every 6 (six) hours as needed for wheezing or shortness of breath.    ROS:  Review of Systems  Constitutional: Negative.   HENT: Negative.   Eyes: Negative.   Respiratory: Negative.   Cardiovascular: Negative.   Gastrointestinal: Negative.   Genitourinary: Negative.   Musculoskeletal: Negative.   Skin: Negative.   Neurological: Negative.   Endo/Heme/Allergies:  Negative.   Psychiatric/Behavioral: Negative.      Objective:   Today's Vitals: BP 138/78 (BP Location: Right Arm, Patient Position: Sitting, Cuff Size: Normal)   Pulse 92   Temp 98.3 F (36.8 C) (Temporal)   Ht 6' (1.829 m)   Wt 201 lb (91.2 kg)   SpO2 97%   BMI 27.26 kg/m  Vitals with BMI 07/31/2020 07/30/2020 07/30/2020  Height 6\' 0"  6\' 0"  6\' 0"   Weight 201 lbs 201 lbs 201 lbs 10 oz  BMI 27.25 12.24 82.50  Systolic 037 048 889  Diastolic 78 58 80  Pulse 92 58 98     Physical Exam Vitals and nursing note reviewed.  Constitutional:      Appearance: Normal appearance. He is well-developed, well-groomed and overweight.  HENT:     Head: Normocephalic and atraumatic.     Right Ear: External ear normal.     Left Ear: External ear normal.     Mouth/Throat:     Comments: Mask in place  Eyes:     General:  Right eye: No discharge.        Left eye: No discharge.     Conjunctiva/sclera: Conjunctivae normal.  Cardiovascular:     Rate and Rhythm: Normal rate. Rhythm irregular.     Pulses: Normal pulses.     Heart sounds: Normal heart sounds.     Comments: Noted to be in A. fib controlled at 92 Pulmonary:     Effort: Pulmonary effort is normal.     Breath sounds: Normal breath sounds. Decreased air movement present.  Musculoskeletal:        General: Normal range of motion.     Cervical back: Normal range of motion and neck supple.  Skin:    General: Skin is warm.  Neurological:     General: No focal deficit present.     Mental Status: He is alert and oriented to person, place, and time.  Psychiatric:        Attention and Perception: Attention normal.        Mood and Affect: Mood normal.        Speech: Speech normal.        Behavior: Behavior normal. Behavior is cooperative.        Thought Content: Thought content normal.        Cognition and Memory: Cognition normal.        Judgment: Judgment normal.     Assessment   1. Encounter for support and  coordination of transition of care   2. Permanent atrial fibrillation (HCC)   3. Stage 3b chronic kidney disease ([place])   4. Essential hypertension     Tests ordered No orders of the defined types were placed in this encounter.    Plan: Please see assessment and plan per problem list above.   No orders of the defined types were placed in this encounter.   Patient to follow-up in 01/27/2021  Note: This dictation was prepared with Dragon dictation along with smaller phrase technology. Similar sounding words can be transcribed inadequately or may not be corrected upon review. Any transcriptional errors that result from this process are unintentional.      Perlie Mayo, NP

## 2020-07-31 NOTE — ED Triage Notes (Signed)
[facility] - pt comes in for SOB. Pt now confused some.

## 2020-07-31 NOTE — Patient Instructions (Signed)
°  HAPPY FALL!  I appreciate the opportunity to provide you with care for your health and wellness. Today we discussed: recent hospital visit   Follow up: 01/27/2021 as scheduled  No labs or referrals today  I hope you have a great Holiday Season!  Call if you need anything before your next appt!   Be safe!   Please continue to practice social distancing to keep you, your family, and our community safe.  If you must go out, please wear a mask and practice good handwashing.  It was a pleasure to see you and I look forward to continuing to work together on your health and well-being. Please do not hesitate to call the office if you need care or have questions about your care.  Have a wonderful day and week. With Gratitude, Cherly Beach, DNP, AGNP-BC

## 2020-07-31 NOTE — ED Provider Notes (Signed)
Emergency Department Provider Note   I have reviewed the triage vital signs and the nursing notes.   HISTORY  Chief Complaint Shortness of Breath   HPI Darius Smith is a 74 y.o. male with past medical history of a-fib on Eliquis presents to the emergency department for evaluation of shortness of breath starting today.  He denies associated chest pain, fever, chills.  Has a prior history of lung cancer but is not currently on chemotherapy or radiation.  He describes fairly abrupt shortness of breath symptoms which ultimately prompted him to call EMS.  They arrived to find him tachypneic with mild tachycardia.  He was hypoxemic at home and started on 3 L nasal cannula. No radiation of symptoms or modifying factors.   Past Medical History:  Diagnosis Date  . Arthritis    both hands  . Atrial fibrillation ([place]) 10/28/2017   Dr. Harl Bowie  . Atrial fibrillation with RVR ([place]) 07/14/2020  . Blood in urine    occ  . Bronchitis   . Cancer (HCC)    Basal cell  . Cardiomegaly 10/28/2017  . Complication of anesthesia    Mr. Wolak developed a. fib in recovery after cysto procedure 10/2017 was transferred to [facility] and underwent successful cardioversion  . Diverticulosis of sigmoid colon 08/2017   Noted on colonoscopy  . Dysrhythmia   . External hemorrhoids 08/2017  . GERD (gastroesophageal reflux disease)   . History of colon polyps 08/2017  . History of kidney stones   . History of rectal bleeding   . History of right inguinal hernia   . Hypertension   . Nephrolithiasis 07/02/2019  . Rectal bleeding 09/02/2017   Added automatically from request for surgery [phone]  . Shortness of breath     Patient Active Problem List   Diagnosis Date Noted  . Encounter for support and coordination of transition of care 07/31/2020  . 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 CHF exacerbation ([place]) 07/31/2020  . Annual visit for general adult medical examination with abnormal findings  07/30/2020  . Acute respiratory failure with hypoxia ([place]) 07/15/2020  . Acute on chronic systolic CHF (congestive heart failure) ([place]) 07/14/2020  . Acute on chronic congestive heart failure ([place])   . 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 Right flank pain 04/09/2020  . Hematuria 04/09/2020  . Atrial fibrillation ([place]) 03/20/2020  . Allergic rhinitis 07/02/2019  . Essential hypertension 08/08/2014  . GERD (gastroesophageal reflux disease) 08/08/2014    Past Surgical History:  Procedure Laterality Date  . CARDIOVERSION     10/2017  . CARDIOVERSION N/A 07/16/2020   Procedure: CARDIOVERSION;  Surgeon: Arnoldo Lenis, MD;  [facility];  Service: Endoscopy;  Laterality: N/A;  . CHOLECYSTECTOMY    . 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 CLEFT PALATE REPAIR     several from childhood til 74 years old  . COLONOSCOPY  04/28/2011   Procedure: COLONOSCOPY;  Surgeon: Rogene Houston, MD;  Location: [facility];  Service: Endoscopy;  Laterality: N/A;  . COLONOSCOPY N/A 07/18/2014   Procedure: COLONOSCOPY;  Surgeon: Rogene Houston, MD;  Location[facility];  Service: Endoscopy;  Laterality: N/A;  830  . COLONOSCOPY N/A 09/09/2017   Procedure: COLONOSCOPY;  Surgeon: Rogene Houston, MD;  [facility];  Service: Endoscopy;  Laterality: N/A;  955  . CYSTOSCOPY/URETEROSCOPY/HOLMIUM LASER/STENT PLACEMENT Bilateral 12/06/2017   Procedure: CYSTOSCOPY/RETROGRADE/URETEROSCOPY/HOLMIUM LASER/STENT EXCHANGE;  Surgeon: Festus Aloe, MD;  Location: [facility];  Service: Urology;  Laterality: Bilateral;  ONLY NEEDS 60 MIN  . HERNIA REPAIR    . POLYPECTOMY  09/09/2017   Procedure: POLYPECTOMY;  Surgeon: Rogene Houston, MD;  Location[facility];  Service: Endoscopy;;  colon  . vocal cord surgery      Allergies Augmentin [amoxicillin-pot clavulanate] and Flomax [tamsulosin hcl]  Family History  Problem Relation Age of Onset  . Breast cancer  Mother   . Prostate cancer Father   . COPD Sister     Social History Social History   Tobacco Use  . Smoking status: Never Smoker  . Smokeless tobacco: Never Used  Vaping Use  . Vaping Use: Never used  Substance Use Topics  . Alcohol use: Not Currently    Alcohol/week: 7.0 standard drinks    Types: 7 Cans of beer per week  . Drug use: No    Review of Systems  Constitutional: No fever/chills Eyes: No visual changes. ENT: No sore throat. Cardiovascular: Denies chest pain. Respiratory: Positive shortness of breath. Gastrointestinal: No abdominal pain.  No nausea, no vomiting.  No diarrhea.  No constipation. Genitourinary: Negative for dysuria. Musculoskeletal: Negative for back pain. Skin: Negative for rash. Neurological: Negative for headaches, focal weakness or numbness.  10-point ROS otherwise negative.  ____________________________________________   PHYSICAL EXAM:  VITAL SIGNS: ED Triage Vitals  Enc Vitals Group     BP 07/31/20 2130 112/90     Pulse Rate 07/31/20 2130 (!) 54     Resp 07/31/20 2130 19     Temp --      Temp src --      SpO2 07/31/20 2130 96 %     Weight 07/31/20 2125 201 lb 1 oz (91.2 kg)     Height 07/31/20 2125 6' (1.829 m)   Constitutional: Alert and oriented. Well appearing and in no acute distress. Eyes: Conjunctivae are normal.  Head: Atraumatic. Nose: No congestion/rhinnorhea. Mouth/Throat: Mucous membranes are moist. Neck: No stridor.   Cardiovascular: Tachycardia. Good peripheral circulation. Grossly normal heart sounds.   Respiratory: Slight increased respiratory effort.  No retractions. Lungs with no clear wheezing. Diminished at the right base.  Gastrointestinal: Soft and nontender. No distention.  Musculoskeletal: No gross deformities of extremities. Neurologic:  Normal speech and language.  Skin:  Skin is warm, dry and intact. No rash noted.  ____________________________________________   LABS (all labs ordered are  listed, but only abnormal results are displayed)  Labs Reviewed  COMPREHENSIVE METABOLIC PANEL - Abnormal; Notable for the following components:      Result Value   Sodium 134 (*)    CO2 19 (*)    Glucose, Bld 124 (*)    BUN 33 (*)    Creatinine, Ser 2.25 (*)    Calcium 8.2 (*)    Total Protein 6.2 (*)    Albumin 3.2 (*)    AST 78 (*)    ALT 87 (*)    Total Bilirubin 1.6 (*)    GFR, Estimated 30 (*)    All other components within normal limits  CBC WITH DIFFERENTIAL/PLATELET - Abnormal; Notable for the following components:   RDW 16.6 (*)    Lymphs Abs 0.4 (*)    All other components within normal limits  BLOOD GAS, VENOUS - Abnormal; Notable for the following components:   pH, Ven 7.439 (*)    pCO2, Ven 33.3 (*)    pO2, Ven 69.3 (*)    All  other components within normal limits  COMPREHENSIVE METABOLIC PANEL - Abnormal; Notable for the following components:   Glucose, Bld 105 (*)    BUN 34 (*)    Creatinine, Ser 2.28 (*)    Calcium 8.1 (*)    Total Protein 5.8 (*)    Albumin 3.0 (*)    AST 65 (*)    ALT 84 (*)    Total Bilirubin 1.5 (*)    GFR, Estimated 29 (*)    All other components within normal limits  CBC WITH DIFFERENTIAL/PLATELET - Abnormal; Notable for the following components:   Hemoglobin 12.4 (*)    RDW 16.8 (*)    Lymphs Abs 0.3 (*)    All other components within normal limits  TROPONIN I (HIGH SENSITIVITY) - Abnormal; Notable for the following components:   Troponin I (High Sensitivity) 60 (*)    All other components within normal limits  TROPONIN I (HIGH SENSITIVITY) - Abnormal; Notable for the following components:   Troponin I (High Sensitivity) 63 (*)    All other components within normal limits  TROPONIN I (HIGH SENSITIVITY) - Abnormal; Notable for the following components:   Troponin I (High Sensitivity) 57 (*)    All other components within normal limits  RESPIRATORY PANEL BY RT PCR (FLU A&B, COVID)  BRAIN NATRIURETIC PEPTIDE  MAGNESIUM  TSH    ____________________________________________  EKG   EKG Interpretation  Date/Time:  Thursday July 31 2020 22:50:47 EST Ventricular Rate:  109 PR Interval:    QRS Duration: 117 QT Interval:  366 QTC Calculation: 493 R Axis:   16 Text Interpretation: Atrial fibrillation Nonspecific intraventricular conduction delay Borderline repolarization abnormality No STEMI Confirmed by Nanda Quinton [phone]) on 07/31/2020 10:57:40 PM       ____________________________________________  RADIOLOGY  CT Angio Chest PE W and/or Wo Contrast  Result Date: 07/31/2020 CLINICAL DATA:  74 year old male with concern for pulmonary embolism. History of metastatic follicular lymphoma. EXAM: CT ANGIOGRAPHY CHEST WITH CONTRAST TECHNIQUE: Multidetector CT imaging of the chest was performed using the standard protocol during bolus administration of intravenous contrast. Multiplanar CT image reconstructions and MIPs were obtained to evaluate the vascular anatomy. CONTRAST:  92mL OMNIPAQUE IOHEXOL 350 MG/ML SOLN COMPARISON:  Chest CT dated 05/27/2020. FINDINGS: Cardiovascular: There is moderate to severe cardiomegaly. There is retrograde flow of contrast from the right atrium into the IVC in keeping with right heart dysfunction. No pericardial effusion. The thoracic aorta is grossly unremarkable. There is dilatation of the main pulmonary trunk suggestive of pulmonary hypertension. Evaluation of the pulmonary arteries is somewhat limited due to respiratory motion artifact. No pulmonary artery embolus identified. Mediastinum/Nodes: There is no hilar or mediastinal adenopathy. The esophagus is grossly unremarkable. No mediastinal fluid collection. Lungs/Pleura: Moderate right and small left pleural effusions with associated partial compressive atelectasis of the lower lobes. There is diffuse interstitial prominence consistent with edema. There is no pneumothorax. The central airways are patent. Upper Abdomen: No acute  abnormality. Musculoskeletal: A 4.3 x 3.9 cm soft tissue mass in the left axilla as seen on the prior CT consistent with malignancy/metastasis. T11 metastatic disease with partial destructive changes of the vertebral body. No acute osseous pathology. Review of the MIP images confirms the above findings. IMPRESSION: 1. No CT evidence of pulmonary artery embolus. 2. Cardiomegaly with findings of CHF and bilateral pleural effusions. 3. Left axillary soft tissue mass as seen on the prior CT consistent with malignancy/metastasis. 4. Dilatation of the main pulmonary trunk suggestive of pulmonary hypertension. 5.  T11 osseous metastasis. Electronically Signed   By: Anner Crete M.D.   On: 07/31/2020 22:53   DG Chest Port 1 View  Result Date: 08/01/2020 CLINICAL DATA:  Pulmonary edema. EXAM: PORTABLE CHEST 1 VIEW COMPARISON:  07/31/2020 FINDINGS: The heart is enlarged but stable. Mild central vascular congestion but no overt pulmonary edema. Small bilateral pleural effusions. No pneumothorax. IMPRESSION: Cardiac enlargement with mild central vascular congestion and small bilateral pleural effusions. Electronically Signed   By: Marijo Sanes M.D.   On: 08/01/2020 07:09   DG Chest Portable 1 View  Result Date: 07/31/2020 CLINICAL DATA:  Dyspnea EXAM: PORTABLE CHEST 1 VIEW COMPARISON:  07/14/2020 FINDINGS: Lungs are clear. No pneumothorax or pleural effusion. Moderate cardiomegaly is unchanged from prior examination. Particular enlargement of the right atrium is again noted. Pulmonary vascularity is normal. No acute bone abnormality. IMPRESSION: Stable moderate cardiomegaly with particular enlargement of the right atrium. Electronically Signed   By: Fidela Salisbury MD   On: 07/31/2020 21:59    ____________________________________________   PROCEDURES  Procedure(s) performed:   Procedures  CRITICAL CARE Performed by: Margette Fast Total critical care time: 35 minutes Critical care time was exclusive  of separately billable procedures and treating other patients. Critical care was necessary to treat or prevent imminent or life-threatening deterioration. Critical care was time spent personally by me on the following activities: development of treatment plan with patient and/or surrogate as well as nursing, discussions with consultants, evaluation of patient's response to treatment, examination of patient, obtaining history from patient or surrogate, ordering and performing treatments and interventions, ordering and review of laboratory studies, ordering and review of radiographic studies, pulse oximetry and re-evaluation of patient's condition.  Nanda Quinton, MD Emergency Medicine  ____________________________________________   INITIAL IMPRESSION / ASSESSMENT AND PLAN / ED COURSE  Pertinent labs & imaging results that were available during my care of the patient were reviewed by me and considered in my medical decision making (see chart for details).   Patient presents to the emergency department for evaluation of shortness of breath symptoms.  He is not 3 L nasal cannula with diminished breath sounds at the right base.  He does have history of lung cancer.  PE is on my differential but somewhat lower as the patient is on Eliquis. CXR with right atrium enlargement. CTA performed to evaluate for PE in this setting. No PE but CHF changes, mets, and pleural effusion.   CTA performed prior to chemistry results for unclear reason. CTA showing no PE but pleural effusion. No PNA. COVID negative. Patient on 4L [place] O2. IV lasix given. No BiPAP or intubation required.   Discussed patient's case with TRH to request admission. Patient and family (if present) updated with plan. Care transferred to [facility] service.  I reviewed all nursing notes, vitals, pertinent old records, EKGs, labs, imaging (as available).  ____________________________________________  FINAL CLINICAL IMPRESSION(S) / ED DIAGNOSES  Final  diagnoses:  Pleural effusion  Acute respiratory failure with hypoxia (HCC)     MEDICATIONS GIVEN DURING THIS VISIT:  Medications  amiodarone (PACERONE) tablet 200 mg (has no administration in time range)  hydrALAZINE (APRESOLINE) tablet 25 mg (has no administration in time range)  isosorbide mononitrate (IMDUR) 24 hr tablet 15 mg (has no administration in time range)  temazepam (RESTORIL) capsule 7.5 mg (has no administration in time range)  apixaban (ELIQUIS) tablet 5 mg (5 mg Oral Not Given 08/01/20 0255)  albuterol (VENTOLIN HFA) 108 (90 Base) MCG/ACT inhaler 2 puff (has  no administration in time range)  furosemide (LASIX) injection 60 mg (has no administration in time range)  acetaminophen (TYLENOL) tablet 650 mg (has no administration in time range)    Or  acetaminophen (TYLENOL) suppository 650 mg (has no administration in time range)  oxyCODONE (Oxy IR/ROXICODONE) immediate release tablet 5 mg (has no administration in time range)  ondansetron (ZOFRAN) tablet 4 mg (has no administration in time range)    Or  ondansetron (ZOFRAN) injection 4 mg (has no administration in time range)  feeding supplement (ENSURE ENLIVE / ENSURE PLUS) liquid 237 mL (has no administration in time range)  metoprolol succinate (TOPROL-XL) 24 hr tablet 150 mg (has no administration in time range)  albuterol (VENTOLIN HFA) 108 (90 Base) MCG/ACT inhaler 2 puff (2 puffs Inhalation Given 07/31/20 2323)  iohexol (OMNIPAQUE) 350 MG/ML injection 75 mL (75 mLs Intravenous Contrast Given 07/31/20 2237)  furosemide (LASIX) injection 40 mg (40 mg Intravenous Given 07/31/20 2324)     Note:  This document was prepared using Dragon voice recognition software and may include unintentional dictation errors.  Nanda Quinton, MD, [facility] Emergency Medicine    Seena Ritacco, [facility], MD 08/01/20 [phone]

## 2020-07-31 NOTE — Assessment & Plan Note (Signed)
Darius Smith is encouraged to maintain a well balanced diet that is low in salt. Controlled, continue current medication regimen.  Additionally, he is also reminded that exercise is beneficial for heart health and control of  Blood pressure. 30-60 minutes daily is recommended-walking was suggested.

## 2020-08-01 ENCOUNTER — Inpatient Hospital Stay (HOSPITAL_COMMUNITY): Payer: Medicare Other

## 2020-08-01 DIAGNOSIS — F19982 Other psychoactive substance use, unspecified with psychoactive substance-induced sleep disorder: Secondary | ICD-10-CM | POA: Insufficient documentation

## 2020-08-01 DIAGNOSIS — C8258 Diffuse follicle center lymphoma, lymph nodes of multiple sites: Secondary | ICD-10-CM

## 2020-08-01 DIAGNOSIS — I4891 Unspecified atrial fibrillation: Secondary | ICD-10-CM

## 2020-08-01 DIAGNOSIS — J9601 Acute respiratory failure with hypoxia: Secondary | ICD-10-CM

## 2020-08-01 DIAGNOSIS — R7401 Elevation of levels of liver transaminase levels: Secondary | ICD-10-CM

## 2020-08-01 DIAGNOSIS — I5023 Acute on chronic systolic (congestive) heart failure: Secondary | ICD-10-CM

## 2020-08-01 LAB — CBC
Hematocrit: 44.4 % (ref 37.5–51.0)
Hemoglobin: 14.1 g/dL (ref 13.0–17.7)
MCH: 28.2 pg (ref 26.6–33.0)
MCHC: 31.8 g/dL (ref 31.5–35.7)
MCV: 89 fL (ref 79–97)
Platelets: 205 10*3/uL (ref 150–450)
RBC: 5 x10E6/uL (ref 4.14–5.80)
RDW: 14.6 % (ref 11.6–15.4)
WBC: 5.7 10*3/uL (ref 3.4–10.8)

## 2020-08-01 LAB — CBC WITH DIFFERENTIAL/PLATELET
Abs Immature Granulocytes: 0.02 10*3/uL (ref 0.00–0.07)
Basophils Absolute: 0 10*3/uL (ref 0.0–0.1)
Basophils Relative: 0 %
Eosinophils Absolute: 0.1 10*3/uL (ref 0.0–0.5)
Eosinophils Relative: 1 %
HCT: 40.6 % (ref 39.0–52.0)
Hemoglobin: 12.4 g/dL — ABNORMAL LOW (ref 13.0–17.0)
Immature Granulocytes: 0 %
Lymphocytes Relative: 5 %
Lymphs Abs: 0.3 10*3/uL — ABNORMAL LOW (ref 0.7–4.0)
MCH: 28.4 pg (ref 26.0–34.0)
MCHC: 30.5 g/dL (ref 30.0–36.0)
MCV: 92.9 fL (ref 80.0–100.0)
Monocytes Absolute: 0.5 10*3/uL (ref 0.1–1.0)
Monocytes Relative: 9 %
Neutro Abs: 4.6 10*3/uL (ref 1.7–7.7)
Neutrophils Relative %: 85 %
Platelets: 171 10*3/uL (ref 150–400)
RBC: 4.37 MIL/uL (ref 4.22–5.81)
RDW: 16.8 % — ABNORMAL HIGH (ref 11.5–15.5)
WBC: 5.4 10*3/uL (ref 4.0–10.5)
nRBC: 0 % (ref 0.0–0.2)

## 2020-08-01 LAB — TROPONIN I (HIGH SENSITIVITY)
Troponin I (High Sensitivity): 57 ng/L — ABNORMAL HIGH (ref <18)
Troponin I (High Sensitivity): 63 ng/L — ABNORMAL HIGH (ref <18)

## 2020-08-01 LAB — COMPREHENSIVE METABOLIC PANEL
ALT: 84 U/L — ABNORMAL HIGH (ref 0–44)
AST: 65 U/L — ABNORMAL HIGH (ref 15–41)
Albumin: 3 g/dL — ABNORMAL LOW (ref 3.5–5.0)
Alkaline Phosphatase: 57 U/L (ref 38–126)
Anion gap: 10 (ref 5–15)
BUN: 34 mg/dL — ABNORMAL HIGH (ref 8–23)
CO2: 28 mmol/L (ref 22–32)
Calcium: 8.1 mg/dL — ABNORMAL LOW (ref 8.9–10.3)
Chloride: 100 mmol/L (ref 98–111)
Creatinine, Ser: 2.28 mg/dL — ABNORMAL HIGH (ref 0.61–1.24)
GFR, Estimated: 29 mL/min — ABNORMAL LOW (ref >60)
Glucose, Bld: 105 mg/dL — ABNORMAL HIGH (ref 70–99)
Potassium: 4.2 mmol/L (ref 3.5–5.1)
Sodium: 138 mmol/L (ref 135–145)
Total Bilirubin: 1.5 mg/dL — ABNORMAL HIGH (ref 0.3–1.2)
Total Protein: 5.8 g/dL — ABNORMAL LOW (ref 6.5–8.1)

## 2020-08-01 LAB — LIPID PANEL
Chol/HDL Ratio: 2.9 ratio (ref 0.0–5.0)
Cholesterol, Total: 111 mg/dL (ref 100–199)
HDL: 38 mg/dL — ABNORMAL LOW (ref >39)
LDL Chol Calc (NIH): 59 mg/dL (ref 0–99)
Triglycerides: 62 mg/dL (ref 0–149)
VLDL Cholesterol Cal: 14 mg/dL (ref 5–40)

## 2020-08-01 LAB — MAGNESIUM: Magnesium: 2 mg/dL (ref 1.7–2.4)

## 2020-08-01 LAB — TSH: TSH: 1.156 u[IU]/mL (ref 0.350–4.500)

## 2020-08-01 MED ORDER — METOPROLOL SUCCINATE ER 25 MG PO TB24: 100.0000 mg | ORAL_TABLET | Freq: Every day | ORAL | Status: DC

## 2020-08-01 MED ORDER — ONDANSETRON HCL 4 MG PO TABS: 4.0000 mg | ORAL_TABLET | Freq: Four times a day (QID) | ORAL | Status: DC | PRN

## 2020-08-01 MED ORDER — ACETAMINOPHEN 325 MG PO TABS: 650.0000 mg | ORAL_TABLET | Freq: Four times a day (QID) | ORAL | Status: DC | PRN

## 2020-08-01 MED ORDER — OXYCODONE HCL 5 MG PO TABS: 5.0000 mg | ORAL_TABLET | ORAL | Status: DC | PRN

## 2020-08-01 MED ORDER — METOPROLOL SUCCINATE ER 50 MG PO TB24
150.0000 mg | ORAL_TABLET | Freq: Every day | ORAL | Status: DC
  Administered 2020-08-01 – 2020-08-05 (×5): 150 mg via ORAL
  Filled 2020-08-01: qty 3
  Filled 2020-08-01: qty 6
  Filled 2020-08-01 (×3): qty 3

## 2020-08-01 MED ORDER — ACETAMINOPHEN 650 MG RE SUPP: 650.0000 mg | Freq: Four times a day (QID) | RECTAL | Status: DC | PRN

## 2020-08-01 MED ORDER — HYDRALAZINE HCL 25 MG PO TABS
25.0000 mg | ORAL_TABLET | Freq: Three times a day (TID) | ORAL | Status: DC
  Administered 2020-08-01 – 2020-08-05 (×12): 25 mg via ORAL
  Filled 2020-08-01 (×13): qty 1

## 2020-08-01 MED ORDER — METOPROLOL SUCCINATE ER 25 MG PO TB24: 50.0000 mg | ORAL_TABLET | Freq: Every day | ORAL | Status: DC

## 2020-08-01 MED ORDER — APIXABAN 5 MG PO TABS
5.0000 mg | ORAL_TABLET | Freq: Two times a day (BID) | ORAL | Status: DC
  Administered 2020-08-01 – 2020-08-05 (×9): 5 mg via ORAL
  Filled 2020-08-01 (×9): qty 1

## 2020-08-01 MED ORDER — ONDANSETRON HCL 4 MG/2ML IJ SOLN: 4.0000 mg | Freq: Four times a day (QID) | INTRAMUSCULAR | Status: DC | PRN

## 2020-08-01 MED ORDER — ISOSORBIDE MONONITRATE ER 30 MG PO TB24
15.0000 mg | ORAL_TABLET | Freq: Every day | ORAL | Status: DC
  Administered 2020-08-02 – 2020-08-05 (×4): 15 mg via ORAL
  Filled 2020-08-01 (×8): qty 1

## 2020-08-01 MED ORDER — FUROSEMIDE 10 MG/ML IJ SOLN
60.0000 mg | Freq: Every day | INTRAMUSCULAR | Status: DC
  Administered 2020-08-01: 60 mg via INTRAVENOUS
  Filled 2020-08-01: qty 6

## 2020-08-01 MED ORDER — TEMAZEPAM 7.5 MG PO CAPS
7.5000 mg | ORAL_CAPSULE | Freq: Every evening | ORAL | Status: DC | PRN
  Filled 2020-08-01: qty 1

## 2020-08-01 MED ORDER — ENSURE ENLIVE PO LIQD
237.0000 mL | Freq: Two times a day (BID) | ORAL | Status: DC
  Administered 2020-08-01 – 2020-08-04 (×7): 237 mL via ORAL
  Filled 2020-08-01 (×8): qty 237

## 2020-08-01 MED ORDER — AMIODARONE HCL 200 MG PO TABS
200.0000 mg | ORAL_TABLET | Freq: Every day | ORAL | Status: DC
  Administered 2020-08-01 – 2020-08-05 (×5): 200 mg via ORAL
  Filled 2020-08-01 (×5): qty 1

## 2020-08-01 MED ORDER — ALBUTEROL SULFATE HFA 108 (90 BASE) MCG/ACT IN AERS: 2.0000 | INHALATION_SPRAY | Freq: Four times a day (QID) | RESPIRATORY_TRACT | Status: DC | PRN

## 2020-08-01 NOTE — ED Notes (Signed)
Meal tray given 

## 2020-08-01 NOTE — ED Notes (Signed)
Pt states, "I am feeling the best I have felt in a week"

## 2020-08-01 NOTE — ED Notes (Signed)
Dr. Clearence Ped in with pt at this time

## 2020-08-01 NOTE — Progress Notes (Addendum)
PROGRESS NOTE  Darius Smith AYT:[id number] DOB: Feb 17, 1946 DOA: 07/31/2020 PCP: Perlie Mayo, NP  Brief History   15yom PMH including afib, CHF, follicular lymphoma, admitted for acute hypoxic resp failure secondary CHF.  A & P  Acute hypoxic resp failure secondary to acute/chronic systolic CHF, skipping doses of diuretic at night --hypoxia appears to be resolving > wean to RA as tolerated --still some pedal edema but clinically much better --will continue IV diuresis today, likely change to PO 11/13  AKI superimposed on CKD stage IIIb --slightly higher today w/ diuresis, will change to oral diuretic tomorrow and check BMP in  AM  Mild transaminitis --probably secondary to CHF, better today, no further evaluation  Atrial fibrillation --stable, continue amiodarone, apixaban  CAD --stable, continue BB and Imdur  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  DVT prophylaxis: SCDs Start: 08/01/20 0146 apixaban (ELIQUIS) tablet 5 mg    Code Status: DNR Family Communication: discussed w/ wife by telephone  [name], MD  [facility] Direct contact: see [email or web address] (further directions at bottom of note if needed) 7PM-7AM contact night coverage as at bottom of note 08/01/2020, 5:56 PM  LOS: 1 day    Interval History/Subjective  CC: f/u CHF  Feels much better, breathing better, no pain.  Objective   Vitals:  Vitals:   08/01/20 1530 08/01/20 1548  BP: 115/84 118/87  Pulse: 85 84  Resp: 18 18  Temp:  97.7 F (36.5 C)  SpO2:  100% 100%    Exam:  Constitutional:   . Appears calm and comfortable in ED ENMT:  . grossly normal hearing  Respiratory:  . CTA bilaterally, no w/r/r.  . Respiratory effort normal.  Cardiovascular:  . RRR, no m/r/g . 1+ bilateral pedal edema   Psychiatric:  . Mental status o Mood, affect appropriate  I have personally reviewed the following:   Today's Data  . UOP 1325 . Creatinine 1.61 > 2.25 > 2.28  . K+ WNL . troponins 60, 63, 57 . TSH WNL  Scheduled Meds: . amiodarone  200 mg Oral Daily  . apixaban  5 mg Oral BID  . feeding supplement  237 mL Oral BID BM  . hydrALAZINE  25 mg Oral TID  . isosorbide mononitrate  15 mg Oral Daily  . metoprolol succinate  150 mg Oral Daily   Continuous Infusions:  Principal Problem:   Acute on chronic systolic CHF (congestive heart failure) (HCC) Active Problems:   Atrial fibrillation (HCC)   Follicular lymphoma (HCC)   Acute respiratory failure with hypoxia (HCC)   Transaminitis   LOS: 1 day   How to contact the [facility] Attending or Consulting provider 7A - 7P or covering provider during after hours [place], for this patient?  1. Check the care team in [facility] and look for a) attending/consulting TRH provider listed and b) the [facility] team listed 2. Log into  [email or web address] and use [place]'s universal password to access. If you do not have the password, please contact the hospital operator. 3. Locate the [facility] provider you are looking for under [facility] and page to a number that you can be directly reached. 4. If you still have difficulty reaching the provider, please page the [facility] (Director on Call) for the Hospitalists listed on amion for assistance.

## 2020-08-01 NOTE — ED Notes (Signed)
Pt in bed, pt's O2 had become malpositioned, replaced [place], pt denies pain, pt offers no complaints at this time.

## 2020-08-01 NOTE — Assessment & Plan Note (Signed)
Not under going treatment at this time.

## 2020-08-01 NOTE — ED Notes (Addendum)
Wife called stating PCP at [facility] primary care requested a pallative care consult due to this being 3rd trip in 4 weeks. What do you think? The wifes name is [name] [phone]. Please advise Sent message to Dr Sarajane Jews

## 2020-08-01 NOTE — TOC Initial Note (Signed)
Transition of Care [facility]) - Initial/Assessment Note   Patient Details  Name: Darius Smith MRN: [id number] Date of Birth: 1945-12-04  Transition of Care [facility]) CM/SW Contact:    Darius Don, LCSW Phone Number: 08/01/2020, 9:37 AM  Clinical Narrative: Patient is a 74 year old male who was admitted for CHF exacerbation. Patient has a history of nephrolithiasis, hypertension, inguinal hernia, GERD, and atrial fibrillation. TOC received consult for CHF screening. CSW spoke with patient's wife, Darius Smith. Per wife, patient resides at home with her. Patient does not currently have any DME nor does he have a history of [place] services. Wife reported patient is independent with his ADLs and IADLs. Patient can afford his medications each month, which he takes as prescribed.  Per CHF screening, patient sometimes follows a heart healthy diet when meals are cooked at home, but wife reports they frequently eat out. Wife reported the patient does restrict his salt and fluid intake. Patient is weighing himself daily to monitor fluid retention. TOC to follow for possible discharge needs.           Expected Discharge Plan: Home/Self Care Barriers to Discharge: Continued Medical Work up  Patient Goals and CMS Choice Patient states their goals for this hospitalization and ongoing recovery are:: Discharge home CMS Medicare.gov Compare Post Acute Care list provided to:: Patient Represenative (must comment) Darius Smith (wife)) Choice offered to / list presented to : NA  Expected Discharge Plan and Services Expected Discharge Plan: Home/Self Care In-house Referral: Clinical Social Work Discharge Planning Services: NA Post Acute Care Choice: NA Living arrangements for the past 2 months: Single Family Home              DME Arranged: N/A DME Agency: NA HH Arranged: NA [place] Agency: NA  Prior Living Arrangements/Services Living arrangements for the past 2 months: Single Family Home Lives with:: Spouse Patient  language and need for interpreter reviewed:: Yes Do you feel safe going back to the place where you live?: Yes      Need for Family Participation in Patient Care: Yes (Comment) Care giver support system in place?: Yes (comment) Criminal Activity/Legal Involvement Pertinent to Current Situation/Hospitalization: No - Comment as needed  Emotional Assessment Appearance:: Appears stated age Orientation: : Oriented to Self, Oriented to Place, Oriented to  Time, Oriented to Situation Alcohol / Substance Use: Not Applicable Psych Involvement: No (comment)  Admission diagnosis:  CHF exacerbation ([place]) [I50.9] Patient Active Problem List   Diagnosis Date Noted  . Encounter for support and coordination of transition of care 07/31/2020  . 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 CHF exacerbation ([place]) 07/31/2020  . Annual visit for general adult medical examination with abnormal findings 07/30/2020  . Acute respiratory failure with hypoxia ([place]) 07/15/2020  . Acute on chronic systolic CHF (congestive heart failure) ([place]) 07/14/2020  . Acute on chronic congestive heart failure ([place])   . 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 Right flank pain 04/09/2020  . Hematuria 04/09/2020  . Atrial fibrillation ([place]) 03/20/2020  . Allergic rhinitis 07/02/2019  . Essential hypertension 08/08/2014  . GERD (gastroesophageal reflux disease) 08/08/2014   PCP:  Perlie Mayo, NP Pharmacy:   [facility], [address] [place] [address] Phone: [phone] Fax: [phone]  Readmission Risk Interventions Readmission Risk Prevention Plan 08/01/2020  Transportation Screening Complete  HRI or Home Care Consult Complete  Social Work Consult for Recovery Care Planning/Counseling Complete  Palliative Care Screening Not Applicable  Medication Review (RN Care Manager) Complete  Some recent data might be hidden

## 2020-08-01 NOTE — H&P (Signed)
TRH H&P    Patient Demographics:    Darius Smith, is a 74 y.o. male  MRN: [id number]  DOB - Nov 02, 1945  Admit Date - 07/31/2020  Referring MD/NP/PA: Long  Outpatient Primary MD for the patient is Perlie Mayo, NP  Patient coming from: Home  Chief complaint- Dyspnea   HPI:    Darius Smith  is a 74 y.o. male, with history of rectal bleeding, nephrolithiasis, hypertension, inguinal hernia, GERD, CHF, atrial fibrillation, and more presents to the ED with a chief complaint of dyspnea.  Most of history is obtained from wife at patient's preference.  She reports that patient was feeling fatigued and generalized weakness all day yesterday.  He was coughing up white foam, and gagging.  He had no appetite yesterday or today.  She did get him to drink a protein shake.  She reports that they called EMS because patient was sitting in his recliner and he went to get up and fell back into the recliner.  She ran over to him and he got himself up pretty quickly, and then tried to walk and was staggering.  She noticed he was having labored breathing, was diaphoretic, was clammy.  She suggested calling a family relative who works in healthcare and patient requested that she called EMS.  He did wears no oxygen at baseline, but when EMS arrived they found him to be hypoxic and placed him on 2 L nasal cannula.  This was increased to 3 L nasal cannula in the ER.  Patient reports that he did feel dyspneic, it was worse with exertion, still present at rest.  Markedly improved with the treatments in the ER.  Patient had no chest pain, no palpitations.  He does report that he has been nauseous two or three times in the past couple days.  He reports that he has not noticed leg swelling, and when he does notice leg swelling he takes an extra diuretic.  Patient denies any fevers or pains.  He is not a smoker, not a drinker, does not use illicit drugs.   He is vaccinated for Covid and flu.  Patient recently filled out DNR paperwork per his report.  Of note patient does have stage IV lung cancer with mets throughout his body.  Last echo was September 2021 showed an EF of 20 to 25% with hypokinesis of the left ventricle wall.  In the ED Temperature 98.3, heart rate 54-110, respiratory rate 19-33, blood pressure 90/85, satting at 98% VBG shows a pH of 7.439 PO2 and PCO2 normal White blood cell count is 8.8, hemoglobin 13.2 Chemistry panel reveals an AKI with a BUN of 33 and a creatinine of 2.25 Albumin is 3.2 Troponin 60 and then 63 Negative Covid and flu BNP normal CTA was done because of his increased risk of PE in the setting of cancer.  No PE.  CHF with bilateral pleural effusions and pulmonary hypertension EKG shows a heart rate of 109, A. fib, QTC is 493 -following with Dr. Harl Bowie outpatient considering cardioversion Lasix 40 mg and  albuterol given in the ED    Review of systems:    In addition to the HPI above,  Review of Systems  Constitutional: Positive for malaise/fatigue. Negative for chills and fever.  HENT: Negative for congestion, nosebleeds and sinus pain.   Eyes: Negative for pain and discharge.  Respiratory: Positive for shortness of breath and wheezing. Negative for cough.   Cardiovascular: Negative for chest pain, palpitations and leg swelling.  Gastrointestinal: Positive for nausea. Negative for abdominal pain, constipation, diarrhea and vomiting.  Genitourinary: Negative for dysuria, frequency, hematuria and urgency.  Musculoskeletal: Positive for falls.  Neurological: Positive for weakness.  Psychiatric/Behavioral: Negative for substance abuse.    All other systems reviewed and are negative.    Past History of the following :    Past Medical History:  Diagnosis Date  . Arthritis    both hands  . Atrial fibrillation ([place]) 10/28/2017   Dr. Harl Bowie  . Atrial fibrillation with RVR ([place]) 07/14/2020  .  Blood in urine    occ  . Bronchitis   . Cancer ([facility])    Basal cell  . Cardiomegaly 10/28/2017  . Complication of anesthesia    Mr. Mckeithan developed a. fib in recovery after cysto procedure 10/2017 was transferred to [facility] and underwent successful cardioversion  . Diverticulosis of sigmoid colon 08/2017   Noted on colonoscopy  . Dysrhythmia   . External hemorrhoids 08/2017  . GERD (gastroesophageal reflux disease)   . History of colon polyps 08/2017  . History of kidney stones   . History of rectal bleeding   . History of right inguinal hernia   . Hypertension   . Nephrolithiasis 07/02/2019  . Rectal bleeding 09/02/2017   Added automatically from request for surgery [phone]  . Shortness of breath       Past Surgical History:  Procedure Laterality Date  . CARDIOVERSION     10/2017  . CARDIOVERSION N/A 07/16/2020   Procedure: CARDIOVERSION;  Surgeon: Arnoldo Lenis, MD;  [facility];  Service: Endoscopy;  Laterality: N/A;  . CHOLECYSTECTOMY    . 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 CLEFT PALATE REPAIR     several from childhood til 74 years old  . COLONOSCOPY  04/28/2011   Procedure: COLONOSCOPY;  Surgeon: Rogene Houston, MD;  Location: [facility];  Service: Endoscopy;  Laterality: N/A;  . COLONOSCOPY N/A 07/18/2014   Procedure: COLONOSCOPY;  Surgeon: Rogene Houston, MD;  Location: [facility];  Service: Endoscopy;  Laterality: N/A;  830  . COLONOSCOPY N/A 09/09/2017   Procedure: COLONOSCOPY;  Surgeon: Rogene Houston, MD;  Location: [facility];  Service: Endoscopy;  Laterality: N/A;  955  . CYSTOSCOPY/URETEROSCOPY/HOLMIUM LASER/STENT PLACEMENT Bilateral 12/06/2017   Procedure: CYSTOSCOPY/RETROGRADE/URETEROSCOPY/HOLMIUM LASER/STENT EXCHANGE;  Surgeon: Festus Aloe, MD;  Location: [facility];  Service: Urology;  Laterality: Bilateral;  ONLY NEEDS 60 MIN  . HERNIA REPAIR    . POLYPECTOMY  09/09/2017   Procedure: POLYPECTOMY;   Surgeon: Rogene Houston, MD;  Location: [facility];  Service: Endoscopy;;  colon  . vocal cord surgery        Social History:      Social History   Tobacco Use  . Smoking status: Never Smoker  . Smokeless tobacco: Never Used  Substance Use Topics  . Alcohol use: Not Currently    Alcohol/week: 7.0 standard drinks    Types: 7 Cans of beer per week  Family History :     Family History  Problem Relation Age of Onset  . Breast cancer Mother   . Prostate cancer Father   . COPD Sister       Home Medications:   Prior to Admission medications   Medication Sig Start Date End Date Taking? Authorizing Provider  acetaminophen (TYLENOL) 500 MG tablet Take 1,000 mg by mouth every 6 (six) hours as needed for moderate pain or headache.    [provider]  amiodarone (PACERONE) 200 MG tablet Take 1 tablet (200 mg total) by mouth daily. 07/30/20   Arnoldo Lenis, MD  cetirizine (ZYRTEC) 10 MG tablet Take 1 tablet (10 mg total) by mouth daily as needed for allergies. 01/01/20   Corum, Rex Kras, MD  ELIQUIS 5 MG TABS tablet TAKE ONE TABLET BY MOUTH 2 TIMES A DAY. Patient taking differently: Take 5 mg by mouth 2 (two) times daily.  04/21/20   Arnoldo Lenis, MD  furosemide (LASIX) 40 MG tablet Take 1 tablet (40 mg total) by mouth daily. Take 20 mg by mouth daily as needed for leg swelling 07/18/20   Barton Dubois, MD  hydrALAZINE (APRESOLINE) 25 MG tablet Take 1 tablet (25 mg total) by mouth 3 (three) times daily. 07/18/20   Barton Dubois, MD  isosorbide mononitrate (IMDUR) 30 MG 24 hr tablet Take 0.5 tablets (15 mg total) by mouth daily. 07/19/20   Barton Dubois, MD  metoprolol succinate (TOPROL-XL) 100 MG 24 hr tablet Take 1 tablet (100 mg total) by mouth daily. Take with or immediately following a meal. 07/18/20 10/16/20  Barton Dubois, MD  metoprolol succinate (TOPROL-XL) 50 MG 24 hr tablet Take 1 tablet (50 mg total) by mouth daily. Take with or immediately  following a meal. Take with Toprol XL 100 mg to equal 150 mg Daily 07/30/20 10/28/20  Arnoldo Lenis, MD  temazepam (RESTORIL) 7.5 MG capsule Take 1 capsule (7.5 mg total) by mouth at bedtime as needed for sleep. 07/30/20   Perlie Mayo, NP  VENTOLIN HFA 108 (90 Base) MCG/ACT inhaler Inhale 2 puffs into the lungs every 6 (six) hours as needed for wheezing or shortness of breath. 07/11/20   Fayrene Helper, MD     Allergies:     Allergies  Allergen Reactions  . Augmentin [Amoxicillin-Pot Clavulanate] Other (See Comments)    Increased liver enzymes Has patient had a PCN reaction causing immediate rash, facial/tongue/throat swelling, SOB or lightheadedness with hypotension: No Has patient had a PCN reaction causing severe rash involving mucus membranes or skin necrosis: No Has patient had a PCN reaction that required hospitalization: No Has patient had a PCN reaction occurring within the last 10 years: No  If all of the above answers are "NO", then may proceed with Cephalosporin use.   Marland Kitchen Flomax [Tamsulosin Hcl] Nausea Only    Dizziness  Pt is on flomax     Physical Exam:   Vitals  Blood pressure 104/76, pulse 81, resp. rate (!) 21, height 6' (1.829 m), weight 91.2 kg, SpO2 99 %.  1.  General: Lying supine in bed no acute distress  2. Psychiatric: Mood and behavior normal for situation Cooperative with exam  3. Neurologic: Cranial nerves II through XII grossly intact, no focal deficit on limited exam, speech and language normal  4. HEENMT:  Head is atraumatic, normocephalic, pupils reactive to light, neck is supple, trachea is midline, JVD present  5. Respiratory : Diminished breath sounds in the lower lung  fields bilaterally, no crackles, no wheeze, no rhonchi  6. Cardiovascular : Heart rate is normal at the time of my exam, rhythm is irregularly irregular, no murmurs rubs or gallops  7. Gastrointestinal:  Abdomen is soft, nondistended, nontender to  palpation  8. Skin:  No acute lesion on limited skin exam  9.Musculoskeletal:  2+ pitting edema to mid shin    Data Review:    CBC Recent Labs  Lab 07/31/20 2215  WBC 8.8  HGB 13.2  HCT 41.9  PLT 207  MCV 90.1  MCH 28.4  MCHC 31.5  RDW 16.6*  LYMPHSABS 0.4*  MONOABS 0.8  EOSABS 0.0  BASOSABS 0.0   ------------------------------------------------------------------------------------------------------------------  Results for orders placed or performed during the hospital encounter of 07/31/20 (from the past 48 hour(s))  Respiratory Panel by RT PCR (Flu A&B, Covid) - Nasopharyngeal Swab     Status: None   Collection Time: 07/31/20 10:00 PM   Specimen: Nasopharyngeal Swab  Result Value Ref Range   SARS Coronavirus 2 by RT PCR NEGATIVE NEGATIVE    Comment: (NOTE) SARS-CoV-2 target nucleic acids are NOT DETECTED.  The SARS-CoV-2 RNA is generally detectable in upper respiratoy specimens during the acute phase of infection. The lowest concentration of SARS-CoV-2 viral copies this assay can detect is 131 copies/mL. A negative result does not preclude SARS-Cov-2 infection and should not be used as the sole basis for treatment or other patient management decisions. A negative result may occur with  improper specimen collection/handling, submission of specimen other than nasopharyngeal swab, presence of viral mutation(s) within the areas targeted by this assay, and inadequate number of viral copies (<131 copies/mL). A negative result must be combined with clinical observations, patient history, and epidemiological information. The expected result is Negative.  Fact Sheet for Patients:  PinkCheek.be  Fact Sheet for Healthcare Providers:  GravelBags.it  This test is no t yet approved or cleared by the [place] FDA and  has been authorized for detection and/or diagnosis of SARS-CoV-2 by FDA under an Emergency  Use Authorization (EUA). This EUA will remain  in effect (meaning this test can be used) for the duration of the COVID-19 declaration under Section 564(b)(1) of the Act, 21 U.S.C. section 360bbb-3(b)(1), unless the authorization is terminated or revoked sooner.     Influenza A by PCR NEGATIVE NEGATIVE   Influenza B by PCR NEGATIVE NEGATIVE    Comment: (NOTE) The Xpert Xpress SARS-CoV-2/FLU/RSV assay is intended as an aid in  the diagnosis of influenza from Nasopharyngeal swab specimens and  should not be used as a sole basis for treatment. Nasal washings and  aspirates are unacceptable for Xpert Xpress SARS-CoV-2/FLU/RSV  testing.  Fact Sheet for Patients: PinkCheek.be  Fact Sheet for Healthcare Providers: GravelBags.it  This test is not yet approved or cleared by the [place] FDA and  has been authorized for detection and/or diagnosis of SARS-CoV-2 by  FDA under an Emergency Use Authorization (EUA). This EUA will remain  in effect (meaning this test can be used) for the duration of the  Covid-19 declaration under Section 564(b)(1) of the Act, 21  U.S.C. section 360bbb-3(b)(1), unless the authorization is  terminated or revoked. Performed at [facility], [address], [place], [place] [address]   Blood gas, venous (at [facility] and [facility], not at [facility])     Status: Abnormal   Collection Time: 07/31/20 10:08 PM  Result Value Ref Range   FIO2 36.00    pH, Ven 7.439 (H) 7.25 - 7.43  pCO2, Ven 33.3 (L) 44 - 60 mmHg   pO2, Ven 69.3 (H) 32 - 45 mmHg   Bicarbonate 23.7 20.0 - 28.0 mmol/L   Acid-base deficit 1.6 0.0 - 2.0 mmol/L   O2 Saturation 91.7 %   Patient temperature 39.0     Comment: Performed at [facility], [address], [place], [place] [address]  Comprehensive metabolic panel     Status: Abnormal   Collection Time: 07/31/20 10:15 PM  Result Value Ref Range   Sodium 134 (L) 135 - 145 mmol/L    Comment: DELTA CHECK  NOTED   Potassium 4.3 3.5 - 5.1 mmol/L   Chloride 103 98 - 111 mmol/L   CO2 19 (L) 22 - 32 mmol/L   Glucose, Bld 124 (H) 70 - 99 mg/dL    Comment: Glucose reference range applies only to samples taken after fasting for at least 8 hours.   BUN 33 (H) 8 - 23 mg/dL   Creatinine, Ser 2.25 (H) 0.61 - 1.24 mg/dL   Calcium 8.2 (L) 8.9 - 10.3 mg/dL   Total Protein 6.2 (L) 6.5 - 8.1 g/dL   Albumin 3.2 (L) 3.5 - 5.0 g/dL   AST 78 (H) 15 - 41 U/L   ALT 87 (H) 0 - 44 U/L   Alkaline Phosphatase 67 38 - 126 U/L   Total Bilirubin 1.6 (H) 0.3 - 1.2 mg/dL   GFR, Estimated 30 (L) >60 mL/min    Comment: (NOTE) Calculated using the CKD-EPI Creatinine Equation (2021)    Anion gap 12 5 - 15    Comment: Performed at [facility], [address], [place], [place] [address]  Brain natriuretic peptide     Status: None   Collection Time: 07/31/20 10:15 PM  Result Value Ref Range   B Natriuretic Peptide 25.0 0.0 - 100.0 pg/mL    Comment: Performed at [facility], [address] [place], [place] [address]  Troponin I (High Sensitivity)     Status: Abnormal   Collection Time: 07/31/20 10:15 PM  Result Value Ref Range   Troponin I (High Sensitivity) 60 (H) <18 ng/L    Comment: (NOTE) Elevated high sensitivity troponin I (hsTnI) values and significant  changes across serial measurements may suggest ACS but many other  chronic and acute conditions are known to elevate hsTnI results.  Refer to the Links section for chest pain algorithms and additional  guidance. Performed at [facility], [address], [place], [place] [address]   CBC with Differential     Status: Abnormal   Collection Time: 07/31/20 10:15 PM  Result Value Ref Range   WBC 8.8 4.0 - 10.5 K/uL   RBC 4.65 4.22 - 5.81 MIL/uL   Hemoglobin 13.2 13.0 - 17.0 g/dL   HCT 41.9 39 - 52 %   MCV 90.1 80.0 - 100.0 fL   MCH 28.4 26.0 - 34.0 pg   MCHC 31.5 30.0 - 36.0 g/dL   RDW 16.6 (H) 11.5 - 15.5 %   Platelets 207 150 - 400 K/uL   nRBC 0.0 0.0 -  0.2 %   Neutrophils Relative % 86 %   Neutro Abs 7.5 1.7 - 7.7 K/uL   Lymphocytes Relative 5 %   Lymphs Abs 0.4 (L) 0.7 - 4.0 K/uL   Monocytes Relative 9 %   Monocytes Absolute 0.8 0.1 - 1.0 K/uL   Eosinophils Relative 0 %   Eosinophils Absolute 0.0 0.0 - 0.5 K/uL   Basophils Relative 0 %   Basophils  Absolute 0.0 0.0 - 0.1 K/uL   Immature Granulocytes 0 %   Abs Immature Granulocytes 0.03 0.00 - 0.07 K/uL    Comment: Performed at [facility], [address], [place], [place] [address]  Troponin I (High Sensitivity)     Status: Abnormal   Collection Time: 07/31/20 11:33 PM  Result Value Ref Range   Troponin I (High Sensitivity) 63 (H) <18 ng/L    Comment: (NOTE) Elevated high sensitivity troponin I (hsTnI) values and significant  changes across serial measurements may suggest ACS but many other  chronic and acute conditions are known to elevate hsTnI results.  Refer to the "Links" section for chest pain algorithms and additional  guidance. Performed at [facility], [address], [place], [place] [address]     Chemistries  Recent Labs  Lab 07/30/20 1004 07/31/20 2215  NA 141 134*  K 4.0 4.3  CL 106 103  CO2 26 19*  GLUCOSE 104* 124*  BUN 32* 33*  CREATININE 1.61* 2.25*  CALCIUM 8.5* 8.2*  MG 2.1  --   AST  --  78*  ALT  --  87*  ALKPHOS  --  67  BILITOT  --  1.6*   ------------------------------------------------------------------------------------------------------------------  ------------------------------------------------------------------------------------------------------------------ GFR: Estimated Creatinine Clearance: 31.6 mL/min (A) (by C-G formula based on SCr of 2.25 mg/dL (H)). Liver Function Tests: Recent Labs  Lab 07/31/20 2215  AST 78*  ALT 87*  ALKPHOS 67  BILITOT 1.6*  PROT 6.2*  ALBUMIN 3.2*   No results for input(s): LIPASE, AMYLASE in the last 168 hours. No results for input(s): AMMONIA in the last 168 hours. Coagulation  Profile: No results for input(s): INR, PROTIME in the last 168 hours. Cardiac Enzymes: No results for input(s): CKTOTAL, CKMB, CKMBINDEX, TROPONINI in the last 168 hours. BNP (last 3 results) No results for input(s): PROBNP in the last 8760 hours. HbA1C: No results for input(s): HGBA1C in the last 72 hours. CBG: No results for input(s): GLUCAP in the last 168 hours. Lipid Profile: No results for input(s): CHOL, HDL, LDLCALC, TRIG, CHOLHDL, LDLDIRECT in the last 72 hours. Thyroid Function Tests: No results for input(s): TSH, T4TOTAL, FREET4, T3FREE, THYROIDAB in the last 72 hours. Anemia Panel: No results for input(s): VITAMINB12, FOLATE, FERRITIN, TIBC, IRON, RETICCTPCT in the last 72 hours.  --------------------------------------------------------------------------------------------------------------- Urine analysis:    Component Value Date/Time   COLORURINE YELLOW 09/22/2018 1009   APPEARANCEUR Clear 06/10/2020 0851   LABSPEC 1.012 09/22/2018 1009   PHURINE 6.0 09/22/2018 1009   GLUCOSEU Negative 06/10/2020 0851   HGBUR SMALL (A) 09/22/2018 1009   BILIRUBINUR Negative 06/10/2020 0851   KETONESUR trace (5) (A) 04/09/2020 1050   KETONESUR NEGATIVE 09/22/2018 1009   PROTEINUR 1+ (A) 06/10/2020 0851   PROTEINUR NEGATIVE 09/22/2018 1009   UROBILINOGEN 2.0 (A) 04/09/2020 1050   NITRITE Negative 06/10/2020 0851   NITRITE NEGATIVE 09/22/2018 1009   LEUKOCYTESUR Negative 06/10/2020 0851      Imaging Results:    CT Angio Chest PE W and/or Wo Contrast  Result Date: 07/31/2020 CLINICAL DATA:  74 year old male with concern for pulmonary embolism. History of metastatic follicular lymphoma. EXAM: CT ANGIOGRAPHY CHEST WITH CONTRAST TECHNIQUE: Multidetector CT imaging of the chest was performed using the standard protocol during bolus administration of intravenous contrast. Multiplanar CT image reconstructions and MIPs were obtained to evaluate the vascular anatomy. CONTRAST:  2mL  OMNIPAQUE IOHEXOL 350 MG/ML SOLN COMPARISON:  Chest CT dated 05/27/2020. FINDINGS: Cardiovascular: There is moderate to severe cardiomegaly. There is  retrograde flow of contrast from the right atrium into the IVC in keeping with right heart dysfunction. No pericardial effusion. The thoracic aorta is grossly unremarkable. There is dilatation of the main pulmonary trunk suggestive of pulmonary hypertension. Evaluation of the pulmonary arteries is somewhat limited due to respiratory motion artifact. No pulmonary artery embolus identified. Mediastinum/Nodes: There is no hilar or mediastinal adenopathy. The esophagus is grossly unremarkable. No mediastinal fluid collection. Lungs/Pleura: Moderate right and small left pleural effusions with associated partial compressive atelectasis of the lower lobes. There is diffuse interstitial prominence consistent with edema. There is no pneumothorax. The central airways are patent. Upper Abdomen: No acute abnormality. Musculoskeletal: A 4.3 x 3.9 cm soft tissue mass in the left axilla as seen on the prior CT consistent with malignancy/metastasis. T11 metastatic disease with partial destructive changes of the vertebral body. No acute osseous pathology. Review of the MIP images confirms the above findings. IMPRESSION: 1. No CT evidence of pulmonary artery embolus. 2. Cardiomegaly with findings of CHF and bilateral pleural effusions. 3. Left axillary soft tissue mass as seen on the prior CT consistent with malignancy/metastasis. 4. Dilatation of the main pulmonary trunk suggestive of pulmonary hypertension. 5. T11 osseous metastasis. Electronically Signed   By: Anner Crete M.D.   On: 07/31/2020 22:53   DG Chest Portable 1 View  Result Date: 07/31/2020 CLINICAL DATA:  Dyspnea EXAM: PORTABLE CHEST 1 VIEW COMPARISON:  07/14/2020 FINDINGS: Lungs are clear. No pneumothorax or pleural effusion. Moderate cardiomegaly is unchanged from prior examination. Particular enlargement of  the right atrium is again noted. Pulmonary vascularity is normal. No acute bone abnormality. IMPRESSION: Stable moderate cardiomegaly with particular enlargement of the right atrium. Electronically Signed   By: Fidela Salisbury MD   On: 07/31/2020 21:59    My personal review of EKG: Rhythm A. fib, Rate 109 /min, QTc 493 ,no Acute ST changes   Assessment & Plan:    Active Problems:   CHF exacerbation ([place])   1. Acute respiratory failure with hypoxia 1. Secondary to CHF exacerbation 2. Tachypneic, hypoxic down to 80s when EMS picked him up 3. Normalized with 3 L nasal cannula 4. CTA shows no PE 5.  pulmonary hypertension, bilateral effusions 6. Wean off O2 as tolerated 7. Continue to monitor on telemetry 2. CHF exacerbation 1. Peripheral edema, bilateral pleural effusions, dyspnea 2. BNP is not elevated, but troponins are stable at 60 and 63 we will continue to trend with morning labs 3. Patient has been skipping evening dose of diuretic because he has not been swelling per his report 4. Cardiomegaly on imaging 5. Hypoxic down to 80s requiring nasal cannula 6. 40 mg of Lasix given in ED 7. Monitor I's and O's 8. Daily weights 9. Heart healthy diet with fluid restriction 10. Last echo was done in September 2021 and showed an EF of 20 to 25% 11. Continue beta-blocker 12. Continue to monitor 3. Pleural effusions 1. Likely secondary to above 2. Lasix given in ED 3. Repeat chest x-ray to re-evaluate pleural effusions in the a.m. 4. AKI 1. Likely cardiorenal syndrome 2. Creatinine should improve with Lasix 3. Creatinine today 2.25 4. Baseline creatinine 1.7 5. Hold nephrotoxic agents when possible 5. Near syncopal episode 1. Likely secondary to hypoxia 2. Monitor on telemetry 3. Orthostatics 6. Protein calorie malnutrition 1. Albumin 3.2 2. Protein shakes between meals 3. Encourage nutrient dense food choices 7. Mild transaminitis 1. Last transaminases were 53 and  104 2. Today 78 and 87 3. Trend  in the a.m. 8. A. Fib 1. Continue Pacerone 2. Continue Eliquis 3. Monitor on telemetry 9. CAD 1. Continue beta-blocker and Imdur 10.    DVT Prophylaxis-   Eliquis- SCDs   AM Labs Ordered, also please review Full Orders  Family Communication: Admission, patients condition and plan of care including tests being ordered have been discussed with the patient and wife who indicate understanding and agree with the plan and Code Status.  Code Status: DNR  Admission status: Inpatient :The appropriate admission status for this patient is INPATIENT. Inpatient status is judged to be reasonable and necessary in order to provide the required intensity of service to ensure the patient's safety. The patient's presenting symptoms, physical exam findings, and initial radiographic and laboratory data in the context of their chronic comorbidities is felt to place them at high risk for further clinical deterioration. Furthermore, it is not anticipated that the patient will be medically stable for discharge from the hospital within 2 midnights of admission. The following factors support the admission status of inpatient.     The patient's presenting symptoms include dyspnea The worrisome physical exam findings include hypoxic requiring 3 L nasal cannula The initial radiographic and laboratory data are worrisome because of CTA shows  bilateral pleural effusions The chronic co-morbidities include stage IV lung cancer, hypertension, GERD, cardiomegaly, CHF, A. fib       * I certify that at the point of admission it is my clinical judgment that the patient will require inpatient hospital care spanning beyond 2 midnights from the point of admission due to high intensity of service, high risk for further deterioration and high frequency of surveillance required.*  Time spent in minutes : [place]

## 2020-08-01 NOTE — Assessment & Plan Note (Signed)
Due to cardiac medications he is limited on sleep meds.  Restoril might be the safest option will try low dose. I also question if laying down makes him restless due to air hunger or fluid shifts due to afib. Reviewed in medication with him.

## 2020-08-02 DIAGNOSIS — N179 Acute kidney failure, unspecified: Secondary | ICD-10-CM

## 2020-08-02 DIAGNOSIS — N1832 Chronic kidney disease, stage 3b: Secondary | ICD-10-CM

## 2020-08-02 LAB — BASIC METABOLIC PANEL WITH GFR
Anion gap: 12 (ref 5–15)
BUN: 40 mg/dL — ABNORMAL HIGH (ref 8–23)
CO2: 25 mmol/L (ref 22–32)
Calcium: 8.2 mg/dL — ABNORMAL LOW (ref 8.9–10.3)
Chloride: 99 mmol/L (ref 98–111)
Creatinine, Ser: 1.82 mg/dL — ABNORMAL HIGH (ref 0.61–1.24)
GFR, Estimated: 38 mL/min — ABNORMAL LOW
Glucose, Bld: 98 mg/dL (ref 70–99)
Potassium: 3.2 mmol/L — ABNORMAL LOW (ref 3.5–5.1)
Sodium: 136 mmol/L (ref 135–145)

## 2020-08-02 MED ORDER — POTASSIUM CHLORIDE CRYS ER 20 MEQ PO TBCR
40.0000 meq | EXTENDED_RELEASE_TABLET | ORAL | Status: AC
  Administered 2020-08-02 (×2): 40 meq via ORAL
  Filled 2020-08-02 (×2): qty 2

## 2020-08-02 MED ORDER — FUROSEMIDE 10 MG/ML IJ SOLN
60.0000 mg | Freq: Once | INTRAMUSCULAR | Status: AC
  Administered 2020-08-02: 60 mg via INTRAVENOUS
  Filled 2020-08-02: qty 6

## 2020-08-02 NOTE — Progress Notes (Signed)
Geneticist, molecular received referral for pt to participate in outpt palliative services with [facility].   RN unable to reach pt in room. [place] IT trainer that pt is to [employer]. [place] [facility]iative team will follow up with pt upon dc.   Thank you so much for the referral.  Freddie Breech, RN  [facility] Triage Nurse [phone]

## 2020-08-02 NOTE — Plan of Care (Signed)

## 2020-08-02 NOTE — TOC Progression Note (Signed)
Transition of Care [facility]) - Progression Note    Patient Details  Name: KELVEN FLATER MRN: [id number] Date of Birth: [birth year]  [facility]) CM/SW Contact  Natasha Bence, LCSW Phone Number: 08/02/2020, 4:55 PM  Clinical Narrative:    CSW notified of patient's request for OP palliative. CSW placed referral with [facility] OP palliative. [facility] agreeable to provide services for patient. TOC to follow.   Expected Discharge Plan: Home/Self Care Barriers to Discharge: Continued Medical Work up  Expected Discharge Plan and Services Expected Discharge Plan: Home/Self Care In-house Referral: Clinical Social Work Discharge Planning Services: NA Post Acute Care Choice: NA Living arrangements for the past 2 months: Single Family Home                 DME Arranged: N/A DME Agency: NA       HH Arranged: NA HH Agency: NA         Social Determinants of Health (SDOH) Interventions    Readmission Risk Interventions Readmission Risk Prevention Plan 08/01/2020  Transportation Screening Complete  HRI or [place] Complete  Social Work Consult for [place] Planning/Counseling Complete  Palliative Care Screening Not Applicable  Medication Review Press photographer) Complete  Some recent data might be hidden

## 2020-08-02 NOTE — Progress Notes (Signed)
PROGRESS NOTE  Darius Smith GUY:[id number] DOB: 31-Mar-1946 DOA: 07/31/2020 PCP: Perlie Mayo, NP  Brief History   67yom PMH including afib, CHF, follicular lymphoma, admitted for acute hypoxic resp failure secondary CHF.  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  DVT prophylaxis: SCDs Start: 08/01/20 0146 apixaban (ELIQUIS) tablet 5 mg    Code Status: DNR Family Communication: discussed w/ wife by telephone 11/13  [name], MD  [facility] Direct contact: see [email or web address] (further directions at bottom of note if needed) 7PM-7AM contact night coverage as at bottom of note 08/02/2020, 5:38 PM  LOS: 2 days    Interval History/Subjective  CC: f/u CHF  Feels good, no complaints, breathing better.  Objective   Vitals:  Vitals:   08/02/20 0412 08/02/20 1521  BP: 126/79 (!) 168/88  Pulse: 74 (!) 109  Resp: 20 20  Temp: 98.1 F (36.7 C) 98 F (36.7 C)  SpO2: 99% 94%    Exam: Constitutional:    . Appears calm and comfortable ENMT:  . grossly normal hearing  Respiratory:  . CTA bilaterally, no w/r/r.  . Respiratory effort normal. Cardiovascular:  . RRR, no m/r/g . 2+ BLE extremity edema   Psychiatric:  . Mental status o Mood, affect appropriate  I have personally reviewed the following:   Today's Data  . UOP 1775 . Creatinine 1.61 > 2.25 > 2.28  > 1.82 . K+ 3.2  Scheduled Meds: . amiodarone  200 mg Oral Daily  . apixaban  5 mg Oral BID  . feeding supplement  237 mL Oral BID BM  . hydrALAZINE  25 mg Oral TID  . isosorbide mononitrate  15 mg Oral Daily  . metoprolol succinate  150 mg Oral Daily   Continuous Infusions:  Principal Problem:   Acute on chronic systolic CHF (congestive heart failure) (HCC) Active Problems:   Atrial fibrillation (HCC)   Follicular lymphoma (HCC)   Acute respiratory failure with hypoxia (HCC)   Transaminitis   LOS: 2 days   How to contact the [facility] Attending or Consulting provider [place] or covering provider during after hours [place], for this patient?  1. Check the care team in [facility] and look for a) attending/consulting TRH provider listed and b) the [facility] team listed 2. Log into [email or web address] and use [place]'s universal password to access. If you do not have the password, please contact the hospital operator. 3. Locate the [facility] provider  you are looking for under [facility] and page to a number that you can be directly reached. 4. If you still have difficulty reaching the provider, please page the [facility] (Director on Call) for the Hospitalists listed on amion for assistance.

## 2020-08-03 DIAGNOSIS — N179 Acute kidney failure, unspecified: Secondary | ICD-10-CM

## 2020-08-03 LAB — BASIC METABOLIC PANEL
Anion gap: 11 (ref 5–15)
BUN: 48 mg/dL — ABNORMAL HIGH (ref 8–23)
CO2: 26 mmol/L (ref 22–32)
Calcium: 8.3 mg/dL — ABNORMAL LOW (ref 8.9–10.3)
Chloride: 99 mmol/L (ref 98–111)
Creatinine, Ser: 2.04 mg/dL — ABNORMAL HIGH (ref 0.61–1.24)
GFR, Estimated: 34 mL/min — ABNORMAL LOW (ref >60)
Glucose, Bld: 111 mg/dL — ABNORMAL HIGH (ref 70–99)
Potassium: 3.9 mmol/L (ref 3.5–5.1)
Sodium: 136 mmol/L (ref 135–145)

## 2020-08-03 MED ORDER — FUROSEMIDE 10 MG/ML IJ SOLN
40.0000 mg | Freq: Every day | INTRAMUSCULAR | Status: AC
  Administered 2020-08-03 – 2020-08-04 (×2): 40 mg via INTRAVENOUS
  Filled 2020-08-03 (×2): qty 4

## 2020-08-03 NOTE — Plan of Care (Signed)

## 2020-08-03 NOTE — Progress Notes (Addendum)
PROGRESS NOTE  Darius Smith PFX:[id number] DOB: [birth year] DOA: 07/31/2020 PCP: Perlie Mayo, NP  Brief History   89yom PMH including afib, CHF, follicular lymphoma, admitted for acute hypoxic resp failure secondary CHF.  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  Continue diuresis.  Check BMP in a.m.  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  Status is: Inpatient  Remains inpatient appropriate because:Inpatient level of care appropriate due to severity of illness  Dispo: The patient is from: Home              Anticipated d/c is to: Home              Anticipated d/c date is: 1 day              Patient currently is not medically stable to d/c.  DVT prophylaxis: SCDs Start: 08/01/20 0146 apixaban (ELIQUIS) tablet 5 mg    Code Status: DNR Family Communication: discussed w/ wife at bedside  Darius Hodgkins, MD  [facility] Direct contact: see [email or web address] (further directions at bottom of note if needed) 7PM-7AM contact night coverage as at bottom of note 08/03/2020, 4:41 PM  LOS: 3 days    Interval History/Subjective  CC: f/u CHF  Breathing better.  Objective   Vitals:  Vitals:   08/03/20 0627 08/03/20 1413  BP: 112/84 106/90  Pulse: 98 82  Resp: 16 (!) 21  Temp: 98.4 F (36.9 C) 97.8 F (36.6 C)  SpO2: 91% 96%    Exam: Constitutional:   . Appears calm and  comfortable ENMT:  . grossly normal hearing  Respiratory:  . CTA bilaterally, no w/r. Mild rales right posterior . Respiratory effort normal.  Cardiovascular:  . RRR, no m/r/g . 1+ B lower leg edema   Psychiatric:  . Mental status o Mood, affect appropriate  I have personally reviewed the following:   Today's Data  . UOP 2275 . Creatinine 1.61 > 2.25 > 2.28  > 1.82 > 2.04 . K+ 3.9  Scheduled Meds: . amiodarone  200 mg Oral Daily  . apixaban  5 mg Oral BID  . feeding supplement  237 mL Oral BID BM  . furosemide  40 mg Intravenous Daily  . hydrALAZINE  25 mg Oral TID  . isosorbide mononitrate  15 mg Oral Daily  . metoprolol succinate  150 mg Oral Daily   Continuous Infusions:  Principal Problem:   Acute on chronic systolic CHF (congestive heart failure) (HCC) Active Problems:   Atrial fibrillation (HCC)   Follicular lymphoma (HCC)   Acute respiratory failure with hypoxia (HCC)   Stage 3b chronic kidney disease ([place])   Transaminitis   AKI (acute kidney injury) ([place])   LOS: 3 days   How to contact the [facility] Attending or Consulting provider [place] or covering provider during after hours [place], for this patient?  1. Check the care team in [facility] and look for a) attending/consulting [place] provider listed and  b) the [facility] team listed 2. Log into [email or web address] and use [place]'s universal password to access. If you do not have the password, please contact the hospital operator. 3. Locate the [facility] provider you are looking for under [facility] and page to a number that you can be directly reached. 4. If you still have difficulty reaching the provider, please page the [facility] (Director on Call) for the Hospitalists listed on amion for assistance.

## 2020-08-04 LAB — BASIC METABOLIC PANEL
Anion gap: 9 (ref 5–15)
BUN: 45 mg/dL — ABNORMAL HIGH (ref 8–23)
CO2: 28 mmol/L (ref 22–32)
Calcium: 8.2 mg/dL — ABNORMAL LOW (ref 8.9–10.3)
Chloride: 100 mmol/L (ref 98–111)
Creatinine, Ser: 1.91 mg/dL — ABNORMAL HIGH (ref 0.61–1.24)
GFR, Estimated: 36 mL/min — ABNORMAL LOW (ref >60)
Glucose, Bld: 104 mg/dL — ABNORMAL HIGH (ref 70–99)
Potassium: 3.9 mmol/L (ref 3.5–5.1)
Sodium: 137 mmol/L (ref 135–145)

## 2020-08-04 MED ORDER — FUROSEMIDE 10 MG/ML IJ SOLN
40.0000 mg | Freq: Two times a day (BID) | INTRAMUSCULAR | Status: AC
  Administered 2020-08-04 – 2020-08-05 (×2): 40 mg via INTRAVENOUS
  Filled 2020-08-04 (×2): qty 4

## 2020-08-04 NOTE — Plan of Care (Signed)

## 2020-08-04 NOTE — TOC Progression Note (Signed)
Transition of Care [facility]) - Progression Note    Patient Details  Name: Darius Smith MRN: [id number] Date of Birth: 27-Sep-1945  [facility]) CM/SW Contact  Natasha Bence, LCSW Phone Number: 08/04/2020, 4:57 PM  Clinical Narrative:    CSW inquired about [place] needs with patient's spouse. Patient's spouse requested [facility] and PT to assist with patient's monitoring of CHF and ambulation. CSW placed referral with [name] of [facility]. [facility] agreeable to provide services to patient. TOC to follow.    Expected Discharge Plan: Home/Self Care Barriers to Discharge: Continued Medical Work up  Expected Discharge Plan and Services Expected Discharge Plan: Home/Self Care In-house Referral: Clinical Social Work Discharge Planning Services: NA Post Acute Care Choice: NA Living arrangements for the past 2 months: Single Family Home                 DME Arranged: N/A DME Agency: NA       HH Arranged: NA HH Agency: NA         Social Determinants of Health (SDOH) Interventions    Readmission Risk Interventions Readmission Risk Prevention Plan 08/04/2020 08/01/2020  Transportation Screening Complete Complete  HRI or [place] Complete Complete  Social Work Consult for [place] Planning/Counseling Complete Complete  Palliative Care Screening Complete Not Applicable  Medication Review Press photographer) Complete Complete  Some recent data might be hidden

## 2020-08-04 NOTE — Progress Notes (Signed)
SATURATION QUALIFICATIONS: (This note is used to comply with regulatory documentation for home oxygen)  Patient Saturations on Room Air at Rest = 94%  Patient Saturations on Room Air while Ambulating = 88%  Patient Saturations on 2 Liters of oxygen while Ambulating = 95%  Please briefly explain why patient needs home oxygen: to maintain oxygen saturation above 90% while ambulating.  Deirdre Pippins, RN

## 2020-08-04 NOTE — Progress Notes (Signed)
PROGRESS NOTE  Darius Smith JTT:[id number] DOB: [birth year] DOA: 07/31/2020 PCP: Perlie Mayo, NP  Brief History   78yom PMH including afib, CHF, follicular lymphoma, admitted for acute hypoxic resp failure secondary CHF.  A & P  Acute hypoxic resp failure secondary to acute/chronic systolic CHF, skipping doses of diuretic at night --Hypoxia intermittent.  Continue oxygen, wean as tolerated.  Will likely need home oxygen. --Has significant volume overload still.  We will continue diuresis.  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  Approaching baseline.  Mild transaminitis --probably secondary to CHF, improved, no further evaluation  Atrial fibrillation --Stable.  Continue apixaban and amiodarone  CAD --Stable.  Continue beta-blocker and Imdur.  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  Continue diuresis.  PMT consultation tomorrow as per wife request.  Status is: Inpatient  Remains inpatient appropriate because:Inpatient level of care appropriate due to severity of illness  Dispo: The patient is from: Home              Anticipated d/c is to: Home              Anticipated d/c date is: 1 day              Patient currently is not medically stable to d/c.  DVT prophylaxis: SCDs Start: 08/01/20 0146 apixaban (ELIQUIS) tablet 5 mg    Code Status: DNR Family Communication: discussed w/ wife at bedside 11/15  [name], MD  [facility] Direct contact: see [email or web address] (further directions at bottom of note if needed) 7PM-7AM contact night coverage as at bottom of note 08/04/2020, 5:05 PM  LOS: 4 days    Interval History/Subjective  CC: f/u CHF  Feels fine, breathing okay.  Placed on oxygen overnight.  Still has some lower extremity edema.  Objective   Vitals:  Vitals:   08/03/20 2054 08/04/20 0552   BP: 119/82 115/81  Pulse: 80 98  Resp: 20 20  Temp: 98.3 F (36.8 C) 98.4 F (36.9 C)  SpO2: 97% 98%    Exam: Constitutional:   . Appears calm and comfortable ENMT:  . grossly normal hearing  Respiratory:  . CTA bilaterally, no w/r/r.  . Respiratory effort normal.  Cardiovascular:  . RRR, no m/r/g . Telemetry afib, rate-controlled . 2+ lower leg edema sparing feet Psychiatric:  . Mental status o Mood, affect appropriate  I have personally reviewed the following:   Today's Data  . UOP 3575 . Creatinine 1.61 > 2.25 > 2.28  > 1.82 > 2.04 > 1.91 . K+ 3.9  Scheduled Meds: . amiodarone  200 mg Oral Daily  . apixaban  5 mg Oral BID  . feeding supplement  237 mL Oral BID BM  . furosemide  40 mg Intravenous BID  . hydrALAZINE  25 mg Oral TID  . isosorbide mononitrate  15 mg Oral Daily  . metoprolol succinate  150 mg Oral Daily   Continuous Infusions:  Principal Problem:   Acute on chronic systolic CHF (congestive heart failure) (HCC) Active Problems:   Atrial fibrillation (HCC)   Follicular lymphoma (HCC)   Acute respiratory failure with hypoxia (HCC)   Stage 3b chronic kidney disease ([place])   Transaminitis   AKI (acute kidney injury) ([place])   LOS: 4 days   How to contact the [facility] Attending or Consulting provider [place] or covering provider during after  hours 7P -7A, for this patient?  1. Check the care team in [facility] and look for a) attending/consulting TRH provider listed and b) the [facility] team listed 2. Log into [email or web address] and use [place]'s universal password to access. If you do not have the password, please contact the hospital operator. 3. Locate the [facility] provider you are looking for under [facility] and page to a number that you can be directly reached. 4. If you still have difficulty reaching the provider, please page the [facility] (Director on Call) for the Hospitalists listed on amion for assistance.

## 2020-08-05 ENCOUNTER — Encounter (HOSPITAL_COMMUNITY): Payer: Self-pay | Admitting: Family Medicine

## 2020-08-05 DIAGNOSIS — Z7189 Other specified counseling: Secondary | ICD-10-CM

## 2020-08-05 DIAGNOSIS — Z515 Encounter for palliative care: Secondary | ICD-10-CM

## 2020-08-05 LAB — BASIC METABOLIC PANEL
Anion gap: 9 (ref 5–15)
BUN: 35 mg/dL — ABNORMAL HIGH (ref 8–23)
CO2: 29 mmol/L (ref 22–32)
Calcium: 7.9 mg/dL — ABNORMAL LOW (ref 8.9–10.3)
Chloride: 101 mmol/L (ref 98–111)
Creatinine, Ser: 1.57 mg/dL — ABNORMAL HIGH (ref 0.61–1.24)
GFR, Estimated: 46 mL/min — ABNORMAL LOW (ref >60)
Glucose, Bld: 97 mg/dL (ref 70–99)
Potassium: 3.3 mmol/L — ABNORMAL LOW (ref 3.5–5.1)
Sodium: 139 mmol/L (ref 135–145)

## 2020-08-05 MED ORDER — POTASSIUM CHLORIDE CRYS ER 20 MEQ PO TBCR
40.0000 meq | EXTENDED_RELEASE_TABLET | Freq: Once | ORAL | Status: AC
  Administered 2020-08-05: 40 meq via ORAL
  Filled 2020-08-05: qty 2

## 2020-08-05 MED ORDER — FUROSEMIDE 40 MG PO TABS: ORAL_TABLET | ORAL | 1 refills | Status: DC

## 2020-08-05 NOTE — TOC Transition Note (Signed)
Transition of Care [facility]) - CM/SW Discharge Note  Patient Details  Name: Darius Smith MRN: [id number] Date of Birth: 03-19-1946  Transition of Care [facility]) CM/SW Contact:  Sherie Don, LCSW Phone Number: 08/05/2020, 2:22 PM  Clinical Narrative: Patient is ready to discharge. CSW notified [name] with [facility] that orders are in for [facility]. CSW left voicemail with Bevely Palmer with [facility] to notify her patient will be discharging today and [facility] can follow up with OP palliative. CSW made referral to [facility] with [facility] for home O2. Sat note and orders are in. [facility] to deliver O2 to patient's room. HH, DME, and OP palliative are in AVS. TOC signing off.  Final next level of care: [place] Barriers to Discharge: Barriers Resolved  Patient Goals and CMS Choice Patient states their goals for this hospitalization and ongoing recovery are:: Discharge home CMS Medicare.gov Compare Post Acute Care list provided to:: Patient Represenative (must comment) [name] (wife)) Choice offered to / list presented to : Spouse  Discharge Plan and Services In-house Referral: Clinical Social Work Discharge Planning Services: NA Post Acute Care Choice: NA          DME Arranged: Oxygen DME Agency: [facility] Date DME Agency Contacted: 08/05/20 Time DME Agency Contacted: 2130 Representative spoke with at DME Agency: Barbaraann Rondo [place]: PT, RN [facility] Agency: [place] ([place]) Date [place]: 08/05/20 Time [place]: 1211 Representative spoke with at [place]: [place]  Readmission Risk Interventions Readmission Risk Prevention Plan 08/04/2020 08/01/2020  Transportation Screening Complete Complete  [place] or [place] Complete Complete  Social Work Consult for [place] Planning/Counseling Complete Complete  Palliative Care Screening Complete Not Applicable  Medication Review Press photographer) Complete Complete  Some recent data might be hidden

## 2020-08-05 NOTE — Progress Notes (Signed)
Discharge instructions reviewed with patient, wife at bedside. Given AVS. Prescription sent to pharmacy, verablized understanding to pick up. Verbalized understanding of discharge instructions, follow-up appointments, home health RN/PT, home O2 and daily weights, fluid balance monitoring. States he does have working scale at home for daily weights and understands low salt diet. Wife verbalized understanding as well. Patient in stable condition awaiting home O2 arrival for discharge home.

## 2020-08-05 NOTE — Consult Note (Signed)
Consultation Note Date: 08/05/2020   Patient Name: Darius Smith  DOB: 06/30/1946  MRN: [id number]  Age / Sex: 74 y.o., male  PCP: Perlie Mayo, NP Referring Physician: Samuella Cota, MD  Reason for Consultation: Establishing goals of care  HPI/Patient Profile: 74 y.o. male  with past medical history of rectal bleeding, nephrolithiasis, hypertension, inguinal hernia, GERD, CHF, atrial fibrillation, and more admitted on 07/31/2020 with Acute hypoxic resp failure secondary to acute/chronic systolic CHF.   Clinical Assessment and Goals of Care: Darius Smith is resting quietly in bed.  He greets me making and keeping eye contact.  He is alert and oriented x3, able to make his needs known.  Present today at bedside is his wife Darius Smith.  We talk in detail about his acute and chronic health issues including, but not limited to heart failure, A. fib, cancer and treatment plan.  Mr. and Darius Smith tells me that he had surgery about 2 years ago and developed A. fib afterwards.  They share that he sees Dr. Harl Bowie the cardiologist here at [facility].  They tried for cardioversion at the end of October, but were unsuccessful.  New medications were started, there is to be another attempted cardioversion after 3 weeks. We talked about heart failure and sodium restrictions, fluid restrictions, exacerbations.  Mr. Mrs. Smith seem knowledgeable about sodium restrictions, fluid restrictions, daily weights, extra fluid pills as needed.  We talked about the importance of building relationships so that the [place] family can have support during this time.  Darius Smith will go home with advanced home health services.  He is active with [facility] outpatient palliative services, but has yet to have the first at home visit.  We talked about healthcare power of attorney, see below. We talked about CODE STATUS, see below.  Conference with  attending, bedside nursing staff, transition of care team related to patient condition, needs, goals of care.   HCPOA    NEXT OF KIN -wife, Darius Smith.    SUMMARY OF RECOMMENDATIONS   Continue to treat the treatable but no CPR or intubation. Goal is for cardioversion with Dr. Harl Bowie in a few weeks Follow-up with Dr. Raliegh Ip at [facility] cancer center on an as-needed basis. Active with [facility] outpatient palliative services, has not had first visit at this point.   Code Status/Advance Care Planning:  DNR -[name] shared that they have completed healthcare power of attorney and living will paperwork.  I share that outpatient palliative services will continue these discussions as needed.  Symptom Management:   Per hospitalist, no additional needs at this time.  Palliative Prophylaxis:   No special needs at this time  Additional Recommendations (Limitations, Scope, Preferences):  Treat the treatable but no CPR or intubation.  Psycho-social/Spiritual:   Desire for further Chaplaincy support:no  Additional Recommendations: Caregiving  Support/Resources and Education on Hospice  Prognosis:   Unable to determine, based on outcomes.  1 year or more would not be surprising based on functional status, chronic illness burden.  Discharge Planning: Home with home health, outpatient palliative      Primary Diagnoses: Present on Admission: . Acute on chronic systolic CHF (congestive heart failure) ([place]) . Acute respiratory failure with hypoxia ([place]) . Atrial fibrillation ([place]) . Follicular lymphoma ([place]) . Stage 3b chronic kidney disease ([place])   I have reviewed the medical record, interviewed the patient and family, and examined the patient. The following aspects are pertinent.  Past Medical History:  Diagnosis Date  . Arthritis    both hands  . Atrial fibrillation ([place]) 10/28/2017   Dr. Harl Bowie  . Atrial fibrillation with RVR ([place]) 07/14/2020  . Blood in urine     occ  . Bronchitis   . Cancer ([facility])    Basal cell  . Cardiomegaly 10/28/2017  . Complication of anesthesia    Darius Smith developed a. fib in recovery after cysto procedure 10/2017 was transferred to [facility] and underwent successful cardioversion  . Diverticulosis of sigmoid colon 08/2017   Noted on colonoscopy  . Dysrhythmia   . External hemorrhoids 08/2017  . GERD (gastroesophageal reflux disease)   . History of colon polyps 08/2017  . History of kidney stones   . History of rectal bleeding   . History of right inguinal hernia   . Hypertension   . Nephrolithiasis 07/02/2019  . Rectal bleeding 09/02/2017   Added automatically from request for surgery [phone]  . Shortness of breath    Social History   Socioeconomic History  . Marital status: Married    Spouse name: Not on file  . Number of children: Not on file  . Years of education: Not on file  . Highest education level: Not on file  Occupational History  . Occupation: retired  Tobacco Use  . Smoking status: Never Smoker  . Smokeless tobacco: Never Used  Vaping Use  . Vaping Use: Never used  Substance and Sexual Activity  . Alcohol use: Not Currently    Alcohol/week: 7.0 standard drinks    Types: 7 Cans of beer per week  . Drug use: No  . Sexual activity: Not on file  Other Topics Concern  . Not on file  Social History Narrative   Lives with wife Darius Smith of 45 years April 15, 2020      2 children, 2 grandchildren   Dog: Gardner Candle      Enjoys: being outdoors       Diet: eats all food groups    Caffeine: drink coffee in the morning, some tea, diet dr pepper   Water: 3-4 cups daily         Wears seat belt    Does not use phone while driving   [place] put up not loaded          Social Determinants of Radio broadcast assistant Strain: Low Risk   . Difficulty of Paying Living Expenses: Not hard at all  Food Insecurity: No Food Insecurity  . Worried About Charity fundraiser in the Last  Year: Never true  . Ran Out of Food in the Last Year: Never true  Transportation Needs: No Transportation Needs  . Lack of Transportation (Medical): No  . Lack of Transportation (Non-Medical): No  Physical Activity: Sufficiently Active  . Days of Exercise per Week: 7 days  . Minutes of Exercise per Session: 60 min  Stress: No Stress Concern Present  . Feeling of Stress : Not at all  Social Connections: Moderately Integrated  .  Frequency of Communication with Friends and Family: More than three times a week  . Frequency of Social Gatherings with Friends and Family: Twice a week  . Attends Religious Services: More than 4 times per year  . Active Member of Clubs or Organizations: No  . Attends Archivist Meetings: Never  . Marital Status: Married   Family History  Problem Relation Age of Onset  . Breast cancer Mother   . Prostate cancer Father   . COPD Sister    Scheduled Meds: . amiodarone  200 mg Oral Daily  . apixaban  5 mg Oral BID  . feeding supplement  237 mL Oral BID BM  . furosemide  40 mg Intravenous BID  . hydrALAZINE  25 mg Oral TID  . isosorbide mononitrate  15 mg Oral Daily  . metoprolol succinate  150 mg Oral Daily   Continuous Infusions: PRN Meds:.acetaminophen **OR** acetaminophen, albuterol, ondansetron **OR** ondansetron (ZOFRAN) IV, oxyCODONE, temazepam Medications Prior to Admission:  Prior to Admission medications   Medication Sig Start Date End Date Taking? Authorizing Provider  acetaminophen (TYLENOL) 500 MG tablet Take 1,000 mg by mouth every 6 (six) hours as needed for moderate pain or headache.   Yes [provider]  amiodarone (PACERONE) 200 MG tablet Take 1 tablet (200 mg total) by mouth daily. 07/30/20  Yes BranchAlphonse Guild, MD  cetirizine (ZYRTEC) 10 MG tablet Take 1 tablet (10 mg total) by mouth daily as needed for allergies. 01/01/20  Yes Corum, Rex Kras, MD  ELIQUIS 5 MG TABS tablet TAKE ONE TABLET BY MOUTH 2 TIMES A  DAY. Patient taking differently: Take 5 mg by mouth 2 (two) times daily.  04/21/20  Yes Branch, Alphonse Guild, MD  furosemide (LASIX) 40 MG tablet Take 1 tablet (40 mg total) by mouth daily. Take 20 mg by mouth daily as needed for leg swelling 07/18/20  Yes Barton Dubois, MD  hydrALAZINE (APRESOLINE) 25 MG tablet Take 1 tablet (25 mg total) by mouth 3 (three) times daily. 07/18/20  Yes Barton Dubois, MD  isosorbide mononitrate (IMDUR) 30 MG 24 hr tablet Take 0.5 tablets (15 mg total) by mouth daily. 07/19/20  Yes Barton Dubois, MD  metoprolol succinate (TOPROL-XL) 100 MG 24 hr tablet Take 1 tablet (100 mg total) by mouth daily. Take with or immediately following a meal. Patient taking differently: Take 100 mg by mouth daily. Take with Toprol XL 50 mg to equal 150 mg Daily 07/18/20 10/16/20 Yes Barton Dubois, MD  metoprolol succinate (TOPROL-XL) 50 MG 24 hr tablet Take 1 tablet (50 mg total) by mouth daily. Take with or immediately following a meal. Take with Toprol XL 100 mg to equal 150 mg Daily Patient taking differently: Take 50 mg by mouth daily. Take with Toprol XL 100 mg to equal 150 mg Daily 07/30/20 10/28/20 Yes Branch, Alphonse Guild, MD  temazepam (RESTORIL) 7.5 MG capsule Take 1 capsule (7.5 mg total) by mouth at bedtime as needed for sleep. 07/30/20  Yes Perlie Mayo, NP  VENTOLIN HFA 108 (90 Base) MCG/ACT inhaler Inhale 2 puffs into the lungs every 6 (six) hours as needed for wheezing or shortness of breath. 07/11/20  Yes Fayrene Helper, MD   Allergies  Allergen Reactions  . Augmentin [Amoxicillin-Pot Clavulanate] Other (See Comments)    Increased liver enzymes Has patient had a PCN reaction causing immediate rash, facial/tongue/throat swelling, SOB or lightheadedness with hypotension: No Has patient had a PCN reaction causing severe rash involving mucus membranes  or skin necrosis: No Has patient had a PCN reaction that required hospitalization: No Has patient had a PCN reaction  occurring within the last 10 years: No  If all of the above answers are "NO", then may proceed with Cephalosporin use.   Marland Kitchen Flomax [Tamsulosin Hcl] Nausea Only    Dizziness  Pt is on flomax   Review of Systems  Unable to perform ROS: Age    Physical Exam Vitals and nursing note reviewed.  Constitutional:      General: He is not in acute distress.    Appearance: He is normal weight. He is not ill-appearing.  HENT:     Head: Normocephalic and atraumatic.  Cardiovascular:     Rate and Rhythm: Normal rate. Rhythm irregular.  Pulmonary:     Effort: Pulmonary effort is normal.  Musculoskeletal:     Right lower leg: No edema.     Left lower leg: No edema.  Skin:    General: Skin is warm and dry.  Neurological:     Mental Status: He is alert and oriented to person, place, and time.  Psychiatric:        Mood and Affect: Mood normal.        Behavior: Behavior normal.     Vital Signs: BP (!) 122/101 (BP Location: Left Arm)   Pulse 99   Temp 98 F (36.7 C) (Oral)   Resp 20   Ht 6' (1.829 m)   Wt 86 kg   SpO2 95%   BMI 25.71 kg/m  Pain Scale: 0-10   Pain Score: 0-No pain   SpO2: SpO2: 95 % O2 Device:SpO2: 95 % O2 Flow Rate: .O2 Flow Rate (L/min): 3 L/min  IO: Intake/output summary:   Intake/Output Summary (Last 24 hours) at 08/05/2020 6256 Last data filed at 08/04/2020 2300 Gross per 24 hour  Intake 480 ml  Output 1450 ml  Net -970 ml    LBM: Last BM Date: 08/02/20 Baseline Weight: Weight: 91.2 kg Most recent weight: Weight: 86 kg     Palliative Assessment/Data:   Flowsheet Rows     Most Recent Value  Intake Tab  Referral Department Hospitalist  Unit at Time of Referral Cardiac/Telemetry Unit  Palliative Care Primary Diagnosis Cardiac  Date Notified 08/04/20  Palliative Care Type New Palliative care  Reason for referral Clarify Goals of Care  Date of Admission 07/31/20  Date first seen by Palliative Care 08/05/20  # of days Palliative referral  response time 1 Day(s)  # of days IP prior to Palliative referral 4  Clinical Assessment  Palliative Performance Scale Score 40%  Pain Max last 24 hours Not able to report  Pain Min Last 24 hours Not able to report  Dyspnea Max Last 24 Hours Not able to report  Dyspnea Min Last 24 hours Not able to report  Psychosocial & Spiritual Assessment  Palliative Care Outcomes      Time In: 1250 Time Out: 1400 Time Total: 70 minutes  Greater than 50%  of this time was spent counseling and coordinating care related to the above assessment and plan.  Signed by: Drue Novel, NP   Please contact Palliative Medicine Team phone at [phone] for questions and concerns.  For individual provider: See Shea Evans

## 2020-08-05 NOTE — Discharge Summary (Signed)
Physician Discharge Summary  VIRLAN KEMPKER [id number]:[id number] DOB: 1946/08/18 DOA: 07/31/2020  PCP: Perlie Mayo, NP  Admit date: 07/31/2020 Discharge date: 08/05/2020  Recommendations for Outpatient Follow-up:  1. Follow-up hospitalization for CHF 2. Follow-up mild transaminitis and hyperbilirubinemia, of doubtful significance 3. Started on home oxygen   Follow-up [facility] Follow up.   Why: HHPT and RN Contact information: [address] [place]       Perlie Mayo, NP Follow up.   Specialty: Family Medicine Why: as needed Contact information: [place] [address] [phone]        Arnoldo Lenis, MD. Schedule an appointment as soon as possible for a visit in 1 week(s).   Specialty: Cardiology Contact information: [place]  [address] [phone]        [facility] Follow up.   Why: Outpatient palliative Contact information: [place] [phone]               Discharge Diagnoses: Principal diagnosis is #1 Principal Problem:   Acute on chronic systolic CHF (congestive heart failure) ([place]) Active Problems:   Atrial fibrillation (HCC)   Follicular lymphoma (HCC)   Acute respiratory failure with hypoxia (HCC)   Stage 3b chronic kidney disease ([place])   Transaminitis   AKI (acute kidney injury) ([place])   Discharge Condition: improved Disposition: home w/ HH PT, RN  Diet recommendation:  Diet Orders (From admission, onward)    Start     Ordered   08/05/20 0000  Diet - low sodium heart healthy        08/05/20 1120   08/01/20 0146  Diet Heart Room service appropriate? Yes; Fluid consistency: Thin; Fluid restriction: 1800 mL Fluid  Diet effective now       Question Answer Comment  Room service appropriate? Yes   Fluid consistency: Thin   Fluid restriction: 1800 mL Fluid       08/01/20 0146           Filed Weights   08/03/20 0626 08/04/20 0353 08/05/20 0546  Weight: 89.2 kg 88.2 kg 86 kg    HPI/Hospital Course:   20yom PMH including afib, CHF, follicular lymphoma, admitted for acute hypoxic resp failure secondary CHF.  Treated with diuresis with gradual clinical improvement and resolution of hypervolemia.  Hospitalization was uncomplicated.  Individual issues as below.  Acute hypoxic resp failure secondary to acute/chronic systolic CHF, skipping doses of diuretic at night --Diuresed well, appears euvolemic now. --Desaturates with ambulation.  Home on oxygen.  AKI superimposed on CKD stage IIIb --Resolved with diuresis.  Appears to be at baseline.  Mild transaminitis and hyperbilirubinemia --probably secondary to CHF, improved, no further evaluation --can repeat CMP on follow-up  Atrial fibrillation --Stable.  Continue apixaban and amiodarone  CAD --Stable.  Continue beta-blocker and Imdur.  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 O: Vitals:  Vitals:   08/04/20 2141 08/05/20 0546  BP: 106/68 (!) 122/101  Pulse: 77 99  Resp: 20   Temp: 98.9 F (37.2 C) 98 F (36.7 C)  SpO2: 100% 95%    Constitutional:  . Appears calm and comfortable ENMT:  . grossly normal hearing  Respiratory:  . CTA bilaterally, no w/r/r.  . Respiratory effort normal. No retractions or accessory muscle use  Cardiovascular:  . irregular, normal rate, no m/r/g . No LE extremity edema   . Telemetry afib Psychiatric:  . Mental status o Mood, affect appropriate  UOP 1450 -4.3L since admit K+ 3.3 Creatinine at baseline 1.57  Discharge Instructions  Discharge Instructions    (HEART FAILURE PATIENTS) Call MD:  Anytime you have any of the following symptoms: 1) 3 pound weight gain in 24 hours or 5 pounds in 1 week 2) shortness of breath, with  or without a dry hacking cough 3) swelling in the hands, feet or stomach 4) if you have to sleep on extra pillows at night in order to breathe.   Complete by: As directed    Diet - low sodium heart healthy   Complete by: As directed    Discharge instructions   Complete by: As directed    Call your physician or seek immediate medical attention for shortness of breath, swelling, pain or worsening of condition.   Increase activity slowly   Complete by: As directed      Allergies as of 08/05/2020      Reactions   Augmentin [amoxicillin-pot Clavulanate] Other (See Comments)   Increased liver enzymes Has patient had a PCN reaction causing immediate rash, facial/tongue/throat swelling, SOB or lightheadedness with hypotension: No Has patient had a PCN reaction causing severe rash involving mucus membranes or skin necrosis: No Has patient had a PCN reaction that required hospitalization: No Has patient had a PCN reaction occurring within the last 10 years: No  If all of the above answers are "NO", then may proceed with Cephalosporin use.   Flomax [tamsulosin Hcl] Nausea Only   Dizziness  Pt is on flomax      Medication List    TAKE these medications   acetaminophen 500 MG tablet Commonly known as: TYLENOL Take 1,000 mg by mouth every 6 (six) hours as needed for moderate pain or headache.   amiodarone 200 MG tablet Commonly known as: PACERONE Take 1 tablet (200 mg total) by mouth daily.   cetirizine 10 MG tablet Commonly known as: ZYRTEC Take 1 tablet (10 mg total) by mouth daily as needed for allergies.   Eliquis 5 MG Tabs tablet Generic drug: apixaban TAKE ONE TABLET BY MOUTH 2 TIMES A DAY. What changed: See the new instructions.   furosemide 40 MG tablet Commonly known as: LASIX 40mg  in AM, 20mg  at 4 pm. What changed:   how much to take  how to take this  when to take this  additional instructions   hydrALAZINE 25 MG tablet Commonly known as: APRESOLINE Take 1  tablet (25 mg total) by mouth 3 (three) times daily.   isosorbide mononitrate 30 MG 24 hr tablet Commonly known as: IMDUR Take 0.5 tablets (15 mg total) by mouth daily.   metoprolol succinate 100 MG 24 hr tablet Commonly known as: TOPROL-XL Take 1 tablet (100 mg total) by mouth daily. Take with or immediately following a meal. What changed: additional instructions   metoprolol succinate 50 MG 24 hr tablet Commonly known as: TOPROL-XL Take 1 tablet (50 mg total) by mouth daily. Take with or immediately following a meal. Take with Toprol XL 100 mg to equal 150 mg Daily What changed: additional instructions   temazepam 7.5 MG capsule Commonly known as: RESTORIL Take 1 capsule (7.5 mg total) by mouth at bedtime as needed for sleep.   Ventolin HFA 108 (90 Base) MCG/ACT inhaler Generic drug: albuterol Inhale 2 puffs into the lungs every 6 (six)  hours as needed for wheezing or shortness of breath.            Durable Medical Equipment  (From admission, onward)         Start     Ordered   08/04/20 1517  For home use only DME oxygen  Once       Question Answer Comment  Length of Need Lifetime   Oxygen delivery system Gas      08/04/20 1516         Allergies  Allergen Reactions  . Augmentin [Amoxicillin-Pot Clavulanate] Other (See Comments)    Increased liver enzymes Has patient had a PCN reaction causing immediate rash, facial/tongue/throat swelling, SOB or lightheadedness with hypotension: No Has patient had a PCN reaction causing severe rash involving mucus membranes or skin necrosis: No Has patient had a PCN reaction that required hospitalization: No Has patient had a PCN reaction occurring within the last 10 years: No  If all of the above answers are "NO", then may proceed with Cephalosporin use.   Marland Kitchen Flomax [Tamsulosin Hcl] Nausea Only    Dizziness  Pt is on flomax    The results of significant diagnostics from this hospitalization (including imaging,  microbiology, ancillary and laboratory) are listed below for reference.    Significant Diagnostic Studies: CT Angio Chest PE W and/or Wo Contrast  Result Date: 07/31/2020 CLINICAL DATA:  74 year old male with concern for pulmonary embolism. History of metastatic follicular lymphoma. EXAM: CT ANGIOGRAPHY CHEST WITH CONTRAST TECHNIQUE: Multidetector CT imaging of the chest was performed using the standard protocol during bolus administration of intravenous contrast. Multiplanar CT image reconstructions and MIPs were obtained to evaluate the vascular anatomy. CONTRAST:  48mL OMNIPAQUE IOHEXOL 350 MG/ML SOLN COMPARISON:  Chest CT dated 05/27/2020. FINDINGS: Cardiovascular: There is moderate to severe cardiomegaly. There is retrograde flow of contrast from the right atrium into the IVC in keeping with right heart dysfunction. No pericardial effusion. The thoracic aorta is grossly unremarkable. There is dilatation of the main pulmonary trunk suggestive of pulmonary hypertension. Evaluation of the pulmonary arteries is somewhat limited due to respiratory motion artifact. No pulmonary artery embolus identified. Mediastinum/Nodes: There is no hilar or mediastinal adenopathy. The esophagus is grossly unremarkable. No mediastinal fluid collection. Lungs/Pleura: Moderate right and small left pleural effusions with associated partial compressive atelectasis of the lower lobes. There is diffuse interstitial prominence consistent with edema. There is no pneumothorax. The central airways are patent. Upper Abdomen: No acute abnormality. Musculoskeletal: A 4.3 x 3.9 cm soft tissue mass in the left axilla as seen on the prior CT consistent with malignancy/metastasis. T11 metastatic disease with partial destructive changes of the vertebral body. No acute osseous pathology. Review of the MIP images confirms the above findings. IMPRESSION: 1. No CT evidence of pulmonary artery embolus. 2. Cardiomegaly with findings of CHF and  bilateral pleural effusions. 3. Left axillary soft tissue mass as seen on the prior CT consistent with malignancy/metastasis. 4. Dilatation of the main pulmonary trunk suggestive of pulmonary hypertension. 5. T11 osseous metastasis. Electronically Signed   By: Anner Crete M.D.   On: 07/31/2020 22:53   DG Chest Port 1 View  Result Date: 08/01/2020 CLINICAL DATA:  Pulmonary edema. EXAM: PORTABLE CHEST 1 VIEW COMPARISON:  07/31/2020 FINDINGS: The heart is enlarged but stable. Mild central vascular congestion but no overt pulmonary edema. Small bilateral pleural effusions. No pneumothorax. IMPRESSION: Cardiac enlargement with mild central vascular congestion and small bilateral pleural effusions. Electronically Signed   By: Mamie Nick.  Gallerani M.D.   On: 08/01/2020 07:09   DG Chest Portable 1 View  Result Date: 07/31/2020 CLINICAL DATA:  Dyspnea EXAM: PORTABLE CHEST 1 VIEW COMPARISON:  07/14/2020 FINDINGS: Lungs are clear. No pneumothorax or pleural effusion. Moderate cardiomegaly is unchanged from prior examination. Particular enlargement of the right atrium is again noted. Pulmonary vascularity is normal. No acute bone abnormality. IMPRESSION: Stable moderate cardiomegaly with particular enlargement of the right atrium. Electronically Signed   By: Fidela Salisbury MD   On: 07/31/2020 21:59   Microbiology: Recent Results (from the past 240 hour(s))  Respiratory Panel by RT PCR (Flu A&B, Covid) - Nasopharyngeal Swab     Status: None   Collection Time: 07/31/20 10:00 PM   Specimen: Nasopharyngeal Swab  Result Value Ref Range Status   SARS Coronavirus 2 by RT PCR NEGATIVE NEGATIVE Final    Comment: (NOTE) SARS-CoV-2 target nucleic acids are NOT DETECTED.  The SARS-CoV-2 RNA is generally detectable in upper respiratoy specimens during the acute phase of infection. The lowest concentration of SARS-CoV-2 viral copies this assay can detect is 131 copies/mL. A negative result does not preclude  SARS-Cov-2 infection and should not be used as the sole basis for treatment or other patient management decisions. A negative result may occur with  improper specimen collection/handling, submission of specimen other than nasopharyngeal swab, presence of viral mutation(s) within the areas targeted by this assay, and inadequate number of viral copies (<131 copies/mL). A negative result must be combined with clinical observations, patient history, and epidemiological information. The expected result is Negative.  Fact Sheet for Patients:  PinkCheek.be  Fact Sheet for Healthcare Providers:  GravelBags.it  This test is no t yet approved or cleared by the [place] FDA and  has been authorized for detection and/or diagnosis of SARS-CoV-2 by FDA under an Emergency Use Authorization (EUA). This EUA will remain  in effect (meaning this test can be used) for the duration of the COVID-19 declaration under Section 564(b)(1) of the Act, 21 U.S.C. section 360bbb-3(b)(1), unless the authorization is terminated or revoked sooner.     Influenza A by PCR NEGATIVE NEGATIVE Final   Influenza B by PCR NEGATIVE NEGATIVE Final    Comment: (NOTE) The Xpert Xpress SARS-CoV-2/FLU/RSV assay is intended as an aid in  the diagnosis of influenza from Nasopharyngeal swab specimens and  should not be used as a sole basis for treatment. Nasal washings and  aspirates are unacceptable for Xpert Xpress SARS-CoV-2/FLU/RSV  testing.  Fact Sheet for Patients: PinkCheek.be  Fact Sheet for Healthcare Providers: GravelBags.it  This test is not yet approved or cleared by the [place] FDA and  has been authorized for detection and/or diagnosis of SARS-CoV-2 by  FDA under an Emergency Use Authorization (EUA). This EUA will remain  in effect (meaning this test can be used) for the duration of the   Covid-19 declaration under Section 564(b)(1) of the Act, 21  U.S.C. section 360bbb-3(b)(1), unless the authorization is  terminated or revoked. Performed at [facility], [address], [place], [place] [address]      Labs: Basic Metabolic Panel: Recent Labs  Lab 07/30/20 1004 07/31/20 2215 08/01/20 0408 08/02/20 0629 08/03/20 0648 08/04/20 0621 08/05/20 0818  NA 141   < > 138 136 136 137 139  K 4.0   < > 4.2 3.2* 3.9 3.9 3.3*  CL 106   < > 100 99 99 100 101  CO2 26   < > 28 25 26 28  29  GLUCOSE 104*   < > 105* 98 111* 104* 97  BUN 32*   < > 34* 40* 48* 45* 35*  CREATININE 1.61*   < > 2.28* 1.82* 2.04* 1.91* 1.57*  CALCIUM 8.5*   < > 8.1* 8.2* 8.3* 8.2* 7.9*  MG 2.1  --  2.0  --   --   --   --    < > = values in this interval not displayed.   Liver Function Tests: Recent Labs  Lab 07/31/20 2215 08/01/20 0408  AST 78* 65*  ALT 87* 84*  ALKPHOS 67 57  BILITOT 1.6* 1.5*  PROT 6.2* 5.8*  ALBUMIN 3.2* 3.0*   CBC: Recent Labs  Lab 07/31/20 1131 07/31/20 2215 08/01/20 0408  WBC 5.7 8.8 5.4  NEUTROABS  --  7.5 4.6  HGB 14.1 13.2 12.4*  HCT 44.4 41.9 40.6  MCV 89 90.1 92.9  PLT 205 207 171    Recent Labs    07/14/20 1031 07/17/20 0440 07/31/20 2215  BNP 2,694.0* 1,606.0* 25.0    Principal Problem:   Acute on chronic systolic CHF (congestive heart failure) (HCC) Active Problems:   Atrial fibrillation (HCC)   Follicular lymphoma (HCC)   Acute respiratory failure with hypoxia (HCC)   Stage 3b chronic kidney disease ([place])   Transaminitis   AKI (acute kidney injury) ([place])   Time coordinating discharge: 35 minutes  Signed:  [name], MD  [facility]  08/05/2020, 1:15 PM

## 2020-08-06 ENCOUNTER — Telehealth: Payer: Self-pay

## 2020-08-06 DIAGNOSIS — C8290 Follicular lymphoma, unspecified, unspecified site: Secondary | ICD-10-CM | POA: Diagnosis not present

## 2020-08-06 DIAGNOSIS — M19042 Primary osteoarthritis, left hand: Secondary | ICD-10-CM | POA: Diagnosis not present

## 2020-08-06 DIAGNOSIS — I5032 Chronic diastolic (congestive) heart failure: Secondary | ICD-10-CM | POA: Diagnosis not present

## 2020-08-06 DIAGNOSIS — E46 Unspecified protein-calorie malnutrition: Secondary | ICD-10-CM | POA: Diagnosis not present

## 2020-08-06 DIAGNOSIS — I4891 Unspecified atrial fibrillation: Secondary | ICD-10-CM | POA: Diagnosis not present

## 2020-08-06 DIAGNOSIS — I272 Pulmonary hypertension, unspecified: Secondary | ICD-10-CM | POA: Diagnosis not present

## 2020-08-06 DIAGNOSIS — R55 Syncope and collapse: Secondary | ICD-10-CM | POA: Diagnosis not present

## 2020-08-06 DIAGNOSIS — C3490 Malignant neoplasm of unspecified part of unspecified bronchus or lung: Secondary | ICD-10-CM | POA: Diagnosis not present

## 2020-08-06 DIAGNOSIS — I2510 Atherosclerotic heart disease of native coronary artery without angina pectoris: Secondary | ICD-10-CM | POA: Diagnosis not present

## 2020-08-06 DIAGNOSIS — K5730 Diverticulosis of large intestine without perforation or abscess without bleeding: Secondary | ICD-10-CM | POA: Diagnosis not present

## 2020-08-06 DIAGNOSIS — M19041 Primary osteoarthritis, right hand: Secondary | ICD-10-CM | POA: Diagnosis not present

## 2020-08-06 DIAGNOSIS — I130 Hypertensive heart and chronic kidney disease with heart failure and stage 1 through stage 4 chronic kidney disease, or unspecified chronic kidney disease: Secondary | ICD-10-CM | POA: Diagnosis not present

## 2020-08-06 DIAGNOSIS — J9601 Acute respiratory failure with hypoxia: Secondary | ICD-10-CM | POA: Diagnosis not present

## 2020-08-06 DIAGNOSIS — C7951 Secondary malignant neoplasm of bone: Secondary | ICD-10-CM | POA: Diagnosis not present

## 2020-08-06 DIAGNOSIS — N179 Acute kidney failure, unspecified: Secondary | ICD-10-CM | POA: Diagnosis not present

## 2020-08-06 DIAGNOSIS — N1832 Chronic kidney disease, stage 3b: Secondary | ICD-10-CM | POA: Diagnosis not present

## 2020-08-06 NOTE — Telephone Encounter (Signed)
Yes, please order this, thank you!

## 2020-08-06 NOTE — Telephone Encounter (Signed)
Cassandra with [facility] called wanting verbal orders to eval pt for PT. This okay?

## 2020-08-06 NOTE — Telephone Encounter (Signed)
Denise informed.

## 2020-08-06 NOTE — Telephone Encounter (Signed)
Transition Care Management Unsuccessful Follow-up Telephone Call  Date of discharge and from where:  08/05/20  [facility]  Attempts:  1st Attempt  Reason for unsuccessful TCM follow-up call:  Unable to reach patient

## 2020-08-06 NOTE — Telephone Encounter (Signed)
Denise with [facility] called to let us know pt was discharged from [facility] yesterday with recommendations for palliative care. She wanted to know if she could have verbal to see pt. Please advise.

## 2020-08-07 ENCOUNTER — Telehealth: Payer: Self-pay | Admitting: *Deleted

## 2020-08-07 NOTE — Telephone Encounter (Signed)
Pt voiced understanding

## 2020-08-07 NOTE — Telephone Encounter (Signed)
-----   Message from Arnoldo Lenis, MD sent at 08/04/2020  8:59 AM EST ----- Labs show kidney function remains decreaesed but is improving, we may adjust some meds at our f/u   Zandra Abts MD

## 2020-08-07 NOTE — Telephone Encounter (Signed)
Yes definitely a okay please place

## 2020-08-07 NOTE — Telephone Encounter (Signed)
Transition Care Management Follow-up Telephone Call  Date of [facility] and [facility]  How have you been since you were released from the hospital? getting better and getting strength back and eating  Any questions or concerns? No  Items Reviewed:  Did the pt receive and understand the discharge instructions provided? Yes   Medications obtained and verified? Yes   Other? No   Any new allergies since your discharge? No   Dietary orders reviewed? Yes told to watch salt intake and fluid intake  Do you have support at home? Yes   Home Care and Equipment/Supplies: Were home health services ordered? yes If so, what is the name of the agency? Pt doesn't recall  Has the agency set up a time to come to the patient's home? yes Were any new equipment or medical supplies ordered?  Yes: oxygen What is the name of the medical supply agency? Pt doesn't know Were you able to get the supplies/equipment? yes Do you have any questions related to the use of the equipment or supplies? No  Functional Questionnaire: (I = Independent and D = Dependent) ADLs: d   Bathing/Dressing- i  Meal Prep- i  Eating- i  Maintaining continence- i  Transferring/Ambulation- i  Managing Meds- i  Follow up appointments reviewed:   PCP Hospital f/u appt confirmed? Yes  Scheduled to see Cherly Beach  on Nov 30 @ 1:40pm.  [facility] f/u appt confirmed? Yes  Scheduled to see  on12/1  Are transportation arrangements needed? No   If their condition worsens, is the pt aware to call PCP or go to the Emergency Dept.? Yes  Was the patient provided with contact information for the PCP's office or ED? Yes  Was to pt encouraged to call back with questions or concerns? Yes

## 2020-08-07 NOTE — Telephone Encounter (Signed)
Darius Smith informed. She said that Darius Smith was sent home with oxygen but no instructions with it. She went to see him yesterday and Darius Smith did not have it on and his o2 was 91%. She put him on 2 liters and it went back up to 97%. She said Darius Smith is supposed to see you Friday, could you assess this to determine what Darius Smith should be on?

## 2020-08-07 NOTE — Telephone Encounter (Signed)
Cassandra and pt informed. He says he figured this out and he has a hospital f/u next Tuesday.

## 2020-08-07 NOTE — Telephone Encounter (Signed)
I don;t see him on the schedule.  I am happy to assess this when I do see him.

## 2020-08-11 NOTE — Progress Notes (Signed)
Cardiology Office Note    Date:  08/12/2020   ID:  Darius Smith 06/07/1946, MRN [id number]  PCP:  Darius Mayo, NP  Cardiologist: Darius Dolly, MD EPS: None  No chief complaint on file.   History of Present Illness:  Darius Smith is a 74 y.o. 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  Dr. Harl Smith 07/30/2020 at which time Toprol was increased to 150 mg daily and amiodarone was lowered to 200 mg daily with plans to retry cardioversion after amiodarone load.  Also plan to repeat echo once A. fib controlled if ongoing dysfunction consider cardiac catheterization.  Patient was admitted to the hospital the very next day with acute on chronic CHF after skipping the evening Lasix dose. He diuresed 4.3 L and Crt 1.57 at discharge.  Patient comes in accompanied by his wife. Has lost 15 lbs since discharge. Has a pulse ox HR usually in 90's. Got up to 120/m this am with walking. Doesn't have an appetite and having nausea. Has had some dry heaves.   Past Medical History:  Diagnosis Date  . Arthritis    both hands  . Atrial fibrillation ([place]) 10/28/2017   Dr. Harl Smith  . Atrial fibrillation with RVR ([place]) 07/14/2020  . Blood in urine    occ  . Bronchitis   . Cancer (HCC)    Basal cell  . Cardiomegaly 10/28/2017  . Complication of anesthesia    Mr. Creger developed a. fib in recovery after cysto procedure 10/2017 was transferred to [facility] and underwent successful cardioversion  . Diverticulosis of sigmoid colon 08/2017   Noted on colonoscopy  . Dysrhythmia   . External hemorrhoids 08/2017  . GERD (gastroesophageal reflux disease)   . History of colon polyps 08/2017  . History of kidney stones   . History of rectal bleeding   . History of right inguinal hernia   . Hypertension   . Nephrolithiasis 07/02/2019  . Rectal  bleeding 09/02/2017   Added automatically from request for surgery [phone]  . Shortness of breath     Past Surgical History:  Procedure Laterality Date  . CARDIOVERSION     10/2017  . CARDIOVERSION N/A 07/16/2020   Procedure: CARDIOVERSION;  Surgeon: Darius Lenis, MD;  [facility];  Service: Endoscopy;  Laterality: N/A;  . CHOLECYSTECTOMY    . 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 CLEFT PALATE REPAIR     several from childhood til 74 years old  . COLONOSCOPY  04/28/2011   Procedure: COLONOSCOPY;  Surgeon: Darius Houston, MD;  [facility]: [facility];  Service: Endoscopy;  Laterality: N/A;  . COLONOSCOPY N/A 07/18/2014   Procedure: COLONOSCOPY;  Surgeon: Darius Houston, MD;  [facility]: [facility];  Service: Endoscopy;  Laterality: N/A;  830  . COLONOSCOPY N/A 09/09/2017   Procedure: COLONOSCOPY;  Surgeon: Darius Houston, MD;  [facility]: [facility];  Service: Endoscopy;  Laterality: N/A;  955  . CYSTOSCOPY/URETEROSCOPY/HOLMIUM LASER/STENT PLACEMENT Bilateral 12/06/2017   Procedure: CYSTOSCOPY/RETROGRADE/URETEROSCOPY/HOLMIUM LASER/STENT EXCHANGE;  Surgeon: Darius Aloe, MD;  Location: [facility];  Service: Urology;  Laterality: Bilateral;  ONLY NEEDS 60 MIN  . HERNIA REPAIR    . POLYPECTOMY  09/09/2017   Procedure: POLYPECTOMY;  Surgeon: Darius Houston, MD;  Location: [facility];  Service: Endoscopy;;  colon  . vocal cord surgery      Current Medications: Current Meds  Medication Sig  . acetaminophen (TYLENOL) 500 MG tablet Take 1,000 mg by mouth every 6 (six) hours as needed for moderate pain or headache.  Marland Kitchen amiodarone (PACERONE) 200 MG tablet Take 1 tablet (200 mg total) by mouth daily.  . cetirizine (ZYRTEC) 10 MG tablet Take 1 tablet (10 mg total) by mouth daily as needed for allergies.  Marland Kitchen ELIQUIS 5 MG TABS tablet TAKE ONE TABLET BY MOUTH 2 TIMES A DAY. (Patient taking differently: Take 5 mg by mouth 2 (two) times daily. )  .  furosemide (LASIX) 40 MG tablet 40mg  in AM, 20mg  at 4 pm.  . hydrALAZINE (APRESOLINE) 25 MG tablet Take 1 tablet (25 mg total) by mouth 3 (three) times daily.  . isosorbide mononitrate (IMDUR) 30 MG 24 hr tablet Take 0.5 tablets (15 mg total) by mouth daily.  . metoprolol succinate (TOPROL-XL) 100 MG 24 hr tablet Take 1 tablet (100 mg total) by mouth daily. Take with or immediately following a meal. (Patient taking differently: Take 100 mg by mouth daily. Take with Toprol XL 50 mg to equal 150 mg Daily)  . metoprolol succinate (TOPROL-XL) 50 MG 24 hr tablet Take 1 tablet (50 mg total) by mouth daily. Take with or immediately following a meal. Take with Toprol XL 100 mg to equal 150 mg Daily (Patient taking differently: Take 50 mg by mouth daily. Take with Toprol XL 100 mg to equal 150 mg Daily)  . temazepam (RESTORIL) 7.5 MG capsule Take 1 capsule (7.5 mg total) by mouth at bedtime as needed for sleep.  . VENTOLIN HFA 108 (90 Base) MCG/ACT inhaler Inhale 2 puffs into the lungs every 6 (six) hours as needed for wheezing or shortness of breath.     Allergies:   Augmentin [amoxicillin-pot clavulanate] and Flomax [tamsulosin hcl]   Social History   Socioeconomic History  . Marital status: Married    Spouse name: Not on file  . Number of children: Not on file  . Years of education: Not on file  . Highest education level: Not on file  Occupational History  . Occupation: retired  Tobacco Use  . Smoking status: Never Smoker  . Smokeless tobacco: Never Used  Vaping Use  . Vaping Use: Never used  Substance and Sexual Activity  . Alcohol use: Not Currently    Alcohol/week: 7.0 standard drinks    Types: 7 Cans of beer per week  . Drug use: No  . Sexual activity: Not on file  Other Topics Concern  . Not on file  Social History Narrative   Lives with wife Darius Smith of 54 years April 15, 2020      2 children, 2 grandchildren   Dog: Gardner Candle      Enjoys: being outdoors       Diet: eats  all food groups    Caffeine: drink coffee in the morning, some tea, diet dr pepper   Water: 3-4 cups daily         Wears seat belt    Does not use phone while driving   [place] put up not loaded          Social Determinants of Radio broadcast assistant Strain: Low Risk   . Difficulty of Paying Living Expenses: Not hard at all  Food Insecurity: No Food Insecurity  . Worried About [employer] of  Food in the Last Year: Never true  . Ran Out of Food in the Last Year: Never true  Transportation Needs: No Transportation Needs  . Lack of Transportation (Medical): No  . Lack of Transportation (Non-Medical): No  Physical Activity: Sufficiently Active  . Days of Exercise per Week: 7 days  . Minutes of Exercise per Session: 60 min  Stress: No Stress Concern Present  . Feeling of Stress : Not at all  Social Connections: Moderately Integrated  . Frequency of Communication with Friends and Family: More than three times a week  . Frequency of Social Gatherings with Friends and Family: Twice a week  . Attends Religious Services: More than 4 times per year  . Active Member of Clubs or Organizations: No  . Attends Archivist Meetings: Never  . Marital Status: Married     Family History:  The patient's  family history includes Breast cancer in his mother; COPD in his sister; Prostate cancer in his father.   ROS:   Please see the history of present illness.    ROS All other systems reviewed and are negative.   PHYSICAL EXAM:   VS:  BP 110/68   Pulse 70   Ht 6' (1.829 m)   Wt 186 lb (84.4 kg)   SpO2 93%   BMI 25.23 kg/m   Physical Exam  GEN: Well nourished, well developed, in no acute distress  Neck: no JVD, carotid bruits, or masses Cardiac: Irregular irregular; no murmurs, rubs, or gallops  Respiratory:  clear to auscultation bilaterally, normal work of breathing GI: soft, nontender, nondistended, + BS Ext: without cyanosis, clubbing, or edema, Good  distal pulses bilaterally Neuro:  Alert and Oriented x 3 Psych: euthymic mood, full affect  Wt Readings from Last 3 Encounters:  08/12/20 186 lb (84.4 kg)  08/05/20 189 lb 9.5 oz (86 kg)  07/31/20 201 lb (91.2 kg)      Studies/Labs Reviewed:   EKG:  EKG is  ordered today.  The ekg ordered today demonstrates atrial fibrillation at 86 bpm with  ST-T wave changes similar to prior EKGs  Recent Labs: 07/31/2020: B Natriuretic Peptide 25.0 08/01/2020: ALT 84; Hemoglobin 12.4; Magnesium 2.0; Platelets 171; TSH 1.156 08/05/2020: BUN 35; Creatinine, Ser 1.57; Potassium 3.3; Sodium 139   Lipid Panel    Component Value Date/Time   CHOL 111 07/31/2020 1131   TRIG 62 07/31/2020 1131   HDL 38 (L) 07/31/2020 1131   CHOLHDL 2.9 07/31/2020 1131   LDLCALC 59 07/31/2020 1131    Additional studies/ records that were reviewed today include:  Echo 06/12/2020 IMPRESSIONS     1. Left ventricular ejection fraction, by estimation, is 20 to 25%. The  left ventricle has severely decreased function. The left ventricle  demonstrates global hypokinesis. There is mild left ventricular  hypertrophy. Left ventricular diastolic parameters   are indeterminate.   2. Right ventricular systolic function is mildly reduced. The right  ventricular size is moderately enlarged.   3. Left atrial size was severely dilated.   4. Right atrial size was mildly dilated.   5. The mitral valve is normal in structure. Trivial mitral valve  regurgitation. No evidence of mitral stenosis.   6. The aortic valve was not well visualized. There is mild calcification  of the aortic valve. There is mild thickening of the aortic valve. Aortic  valve regurgitation is not visualized. No aortic stenosis is present.   7. The inferior vena cava is normal in size with greater  than 50%  respiratory variability, suggesting right atrial pressure of 3 mmHg.   FINDINGS   Left Ventricle: Global hypokinesis, the anteroseptal wall is  akinetic.  Left ventricular ejection fraction, by estimation, is 20 to 25%. The left  ventricle has severely decreased function. The left ventricle demonstrates  global hypokinesis. Definity  contrast agent was given IV to delineate the left ventricular endocardial  borders. The left ventricular internal cavity size was normal in size.  There is mild left ventricular hypertrophy. Left ventricular diastolic  parameters are indeterminate.   Right Ventricle: The right ventricular size is moderately enlarged. Right  vetricular wall thickness was not assessed. Right ventricular systolic  function is mildly reduced.   Left Atrium: Left atrial size was severely dilated.   Right Atrium: Right atrial size was mildly dilated.   Pericardium: There is no evidence of pericardial effusion.   Mitral Valve: The mitral valve is normal in structure. Trivial mitral  valve regurgitation. No evidence of mitral valve stenosis.   Tricuspid Valve: The tricuspid valve is normal in structure. Tricuspid  valve regurgitation is mild . No evidence of tricuspid stenosis.   Aortic Valve: The aortic valve was not well visualized. There is mild  calcification of the aortic valve. There is mild thickening of the aortic  valve. There is mild aortic valve annular calcification. Aortic valve  regurgitation is not visualized. No  aortic stenosis is present. Aortic valve mean gradient measures 4.2 mmHg.  Aortic valve peak gradient measures 8.1 mmHg. Aortic valve area, by VTI  measures 1.80 cm.   Pulmonic Valve: The pulmonic valve was not well visualized. Pulmonic valve  regurgitation is not visualized. No evidence of pulmonic stenosis.   Aorta: The aortic root is normal in size and structure.   Pulmonary Artery: Indeterminant PASP, inadequate TR jet.   Venous: The inferior vena cava is normal in size with greater than 50%  respiratory variability, suggesting right atrial pressure of 3 mmHg.   IAS/Shunts: The  interatrial septum was not well visualized.      ASSESSMENT:    1. Chronic systolic CHF (congestive heart failure) (HCC)   2. Persistent atrial fibrillation (HCC)   3. Stage 3b chronic kidney disease ([place])   4. Essential hypertension      PLAN:  In order of problems listed above:  Chronic systolic CHF LVEF 20 to 16% on echo 06/12/2020 recently discharged after admission with CHF after missing his Lasix evening dose. Entresto stopped because of rising creatinine.  Creatinine 1.57 at discharge.  He has lost 15 pounds since discharge mostly because of nausea and no appetite.  I suspect this is secondary to the amiodarone.  Will make afternoon dose of Lasix 20 mg as needed for weight gain of 2 or 3 pounds overnight.  Check renal today.  Potassium was 3.3 at discharge and he was not sent home with any potassium.  Persistent atrial fibrillation with RVR with failed cardioversion 07/16/2020 now on amiodarone and Toprol.  Dr. Harl Smith recommend repeat cardioversion in 2 weeks.  If labs are stable and potassium normal we will schedule cardioversion for next week.  The risks (stroke, cardiac arrhythmias rarely resulting in the need for a temporary or permanent pacemaker, skin irritation or burns and complications associated with conscious sedation including aspiration, arrhythmia, respiratory failure and death), benefits (restoration of normal sinus rhythm) and alternatives of a direct current cardioversion were explained in detail to Darius Smith and he agrees to proceed.   CKD stage IIIb Crt 1.57  on 08/05/2020-Entresto stopped in the hospital. Plan to  decrease Lasix to 40 mg once daily then 20 mg as needed in the afternoon for weight gain of 2 or 3 pounds overnight.   Medication Adjustments/Labs and Tests Ordered: Current medicines are reviewed at length with the patient today.  Concerns regarding medicines are outlined above.  Medication changes, Labs and Tests ordered today are listed in the Patient  Instructions below. Patient Instructions  Medication Instructions:  Your physician recommends that you continue on your current medications as directed. Please refer to the Current Medication list given to you today.  *If you need a refill on your cardiac medications before your next appointment, please call your pharmacy*   Lab Work: Your physician recommends that you return for lab work in: Today   If you have labs (blood work) drawn today and your tests are completely normal, you will receive your results only by: Marland Kitchen MyChart Message (if you have MyChart) OR . A paper copy in the mail If you have any lab test that is abnormal or we need to change your treatment, we will call you to review the results.   Testing/Procedures: Your physician has recommended that you have a Cardioversion (DCCV). Electrical Cardioversion uses a jolt of electricity to your heart either through paddles or wired patches attached to your chest. This is a controlled, usually prescheduled, procedure. Defibrillation is done under light anesthesia in the hospital, and you usually go home the day of the procedure. This is done to get your heart back into a normal rhythm. You are not awake for the procedure. Please see the instruction sheet given to you today.   Follow-Up: At [facility], you and your health needs are our priority.  As part of our continuing mission to provide you with exceptional heart care, we have created designated Provider Care Teams.  These Care Teams include your primary Cardiologist (physician) and Advanced Practice Providers (APPs -  Physician Assistants and Nurse Practitioners) who all work together to provide you with the care you need, when you need it.  We recommend signing up for the patient portal called "MyChart".  Sign up information is provided on this After Visit Summary.  MyChart is used to connect with patients for Virtual Visits (Telemedicine).  Patients are able to view lab/test  results, encounter notes, upcoming appointments, etc.  Non-urgent messages can be sent to your provider as well.   To learn more about what you can do with MyChart, go to [email or web address].    Your next appointment:    As Planned   The format for your next appointment:   In Person  Provider:   Carlyle Dolly, MD   Other Instructions Thank you for choosing [facility]!       Sumner Boast, PA-C  08/12/2020 11:51 AM    [facility] [address], [place], [place]  [address] Phone: [phone]; Fax: [phone]

## 2020-08-11 NOTE — H&P (View-Only) (Signed)
Cardiology Office Note    Date:  08/12/2020   ID:  Smith, Darius 03-Feb-1946, MRN [id number]  PCP:  Perlie Mayo, NP  Cardiologist: Carlyle Dolly, MD EPS: None  No chief complaint on file.   History of Present Illness:  Darius Smith is a 74 y.o. 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  Dr. Harl Bowie 07/30/2020 at which time Toprol was increased to 150 mg daily and amiodarone was lowered to 200 mg daily with plans to retry cardioversion after amiodarone load.  Also plan to repeat echo once A. fib controlled if ongoing dysfunction consider cardiac catheterization.  Patient was admitted to the hospital the very next day with acute on chronic CHF after skipping the evening Lasix dose. He diuresed 4.3 L and Crt 1.57 at discharge.  Patient comes in accompanied by his wife. Has lost 15 lbs since discharge. Has a pulse ox HR usually in 90's. Got up to 120/m this am with walking. Doesn't have an appetite and having nausea. Has had some dry heaves.   Past Medical History:  Diagnosis Date  . Arthritis    both hands  . Atrial fibrillation ([place]) 10/28/2017   Dr. Harl Bowie  . Atrial fibrillation with RVR ([place]) 07/14/2020  . Blood in urine    occ  . Bronchitis   . Cancer (HCC)    Basal cell  . Cardiomegaly 10/28/2017  . Complication of anesthesia    Mr. Sudbury developed a. fib in recovery after cysto procedure 10/2017 was transferred to [facility] and underwent successful cardioversion  . Diverticulosis of sigmoid colon 08/2017   Noted on colonoscopy  . Dysrhythmia   . External hemorrhoids 08/2017  . GERD (gastroesophageal reflux disease)   . History of colon polyps 08/2017  . History of kidney stones   . History of rectal bleeding   . History of right inguinal hernia   . Hypertension   . Nephrolithiasis 07/02/2019  . Rectal  bleeding 09/02/2017   Added automatically from request for surgery [phone]  . Shortness of breath     Past Surgical History:  Procedure Laterality Date  . CARDIOVERSION     10/2017  . CARDIOVERSION N/A 07/16/2020   Procedure: CARDIOVERSION;  Surgeon: Arnoldo Lenis, MD;  [facility];  Service: Endoscopy;  Laterality: N/A;  . CHOLECYSTECTOMY    . 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 CLEFT PALATE REPAIR     several from childhood til 74 years old  . COLONOSCOPY  04/28/2011   Procedure: COLONOSCOPY;  Surgeon: Rogene Houston, MD;  [facility]: [facility];  Service: Endoscopy;  Laterality: N/A;  . COLONOSCOPY N/A 07/18/2014   Procedure: COLONOSCOPY;  Surgeon: Rogene Houston, MD;  [facility]: [facility];  Service: Endoscopy;  Laterality: N/A;  830  . COLONOSCOPY N/A 09/09/2017   Procedure: COLONOSCOPY;  Surgeon: Rogene Houston, MD;  [facility]: [facility];  Service: Endoscopy;  Laterality: N/A;  955  . CYSTOSCOPY/URETEROSCOPY/HOLMIUM LASER/STENT PLACEMENT Bilateral 12/06/2017   Procedure: CYSTOSCOPY/RETROGRADE/URETEROSCOPY/HOLMIUM LASER/STENT EXCHANGE;  Surgeon: Festus Aloe, MD;  Location: [facility];  Service: Urology;  Laterality: Bilateral;  ONLY NEEDS 60 MIN  . HERNIA REPAIR    . POLYPECTOMY  09/09/2017   Procedure: POLYPECTOMY;  Surgeon: Rogene Houston, MD;  Location: [facility];  Service: Endoscopy;;  colon  . vocal cord surgery      Current Medications: Current Meds  Medication Sig  . acetaminophen (TYLENOL) 500 MG tablet Take 1,000 mg by mouth every 6 (six) hours as needed for moderate pain or headache.  Marland Kitchen amiodarone (PACERONE) 200 MG tablet Take 1 tablet (200 mg total) by mouth daily.  . cetirizine (ZYRTEC) 10 MG tablet Take 1 tablet (10 mg total) by mouth daily as needed for allergies.  Marland Kitchen ELIQUIS 5 MG TABS tablet TAKE ONE TABLET BY MOUTH 2 TIMES A DAY. (Patient taking differently: Take 5 mg by mouth 2 (two) times daily. )  .  furosemide (LASIX) 40 MG tablet 40mg  in AM, 20mg  at 4 pm.  . hydrALAZINE (APRESOLINE) 25 MG tablet Take 1 tablet (25 mg total) by mouth 3 (three) times daily.  . isosorbide mononitrate (IMDUR) 30 MG 24 hr tablet Take 0.5 tablets (15 mg total) by mouth daily.  . metoprolol succinate (TOPROL-XL) 100 MG 24 hr tablet Take 1 tablet (100 mg total) by mouth daily. Take with or immediately following a meal. (Patient taking differently: Take 100 mg by mouth daily. Take with Toprol XL 50 mg to equal 150 mg Daily)  . metoprolol succinate (TOPROL-XL) 50 MG 24 hr tablet Take 1 tablet (50 mg total) by mouth daily. Take with or immediately following a meal. Take with Toprol XL 100 mg to equal 150 mg Daily (Patient taking differently: Take 50 mg by mouth daily. Take with Toprol XL 100 mg to equal 150 mg Daily)  . temazepam (RESTORIL) 7.5 MG capsule Take 1 capsule (7.5 mg total) by mouth at bedtime as needed for sleep.  . VENTOLIN HFA 108 (90 Base) MCG/ACT inhaler Inhale 2 puffs into the lungs every 6 (six) hours as needed for wheezing or shortness of breath.     Allergies:   Augmentin [amoxicillin-pot clavulanate] and Flomax [tamsulosin hcl]   Social History   Socioeconomic History  . Marital status: Married    Spouse name: Not on file  . Number of children: Not on file  . Years of education: Not on file  . Highest education level: Not on file  Occupational History  . Occupation: retired  Tobacco Use  . Smoking status: Never Smoker  . Smokeless tobacco: Never Used  Vaping Use  . Vaping Use: Never used  Substance and Sexual Activity  . Alcohol use: Not Currently    Alcohol/week: 7.0 standard drinks    Types: 7 Cans of beer per week  . Drug use: No  . Sexual activity: Not on file  Other Topics Concern  . Not on file  Social History Narrative   Lives with wife Darius Smith of 72 years April 15, 2020      2 children, 2 grandchildren   Dog: Gardner Candle      Enjoys: being outdoors       Diet: eats  all food groups    Caffeine: drink coffee in the morning, some tea, diet dr pepper   Water: 3-4 cups daily         Wears seat belt    Does not use phone while driving   [place] put up not loaded          Social Determinants of Radio broadcast assistant Strain: Low Risk   . Difficulty of Paying Living Expenses: Not hard at all  Food Insecurity: No Food Insecurity  . Worried About [employer] of  Food in the Last Year: Never true  . Ran Out of Food in the Last Year: Never true  Transportation Needs: No Transportation Needs  . Lack of Transportation (Medical): No  . Lack of Transportation (Non-Medical): No  Physical Activity: Sufficiently Active  . Days of Exercise per Week: 7 days  . Minutes of Exercise per Session: 60 min  Stress: No Stress Concern Present  . Feeling of Stress : Not at all  Social Connections: Moderately Integrated  . Frequency of Communication with Friends and Family: More than three times a week  . Frequency of Social Gatherings with Friends and Family: Twice a week  . Attends Religious Services: More than 4 times per year  . Active Member of Clubs or Organizations: No  . Attends Archivist Meetings: Never  . Marital Status: Married     Family History:  The patient's  family history includes Breast cancer in his mother; COPD in his sister; Prostate cancer in his father.   ROS:   Please see the history of present illness.    ROS All other systems reviewed and are negative.   PHYSICAL EXAM:   VS:  BP 110/68   Pulse 70   Ht 6' (1.829 m)   Wt 186 lb (84.4 kg)   SpO2 93%   BMI 25.23 kg/m   Physical Exam  GEN: Well nourished, well developed, in no acute distress  Neck: no JVD, carotid bruits, or masses Cardiac: Irregular irregular; no murmurs, rubs, or gallops  Respiratory:  clear to auscultation bilaterally, normal work of breathing GI: soft, nontender, nondistended, + BS Ext: without cyanosis, clubbing, or edema, Good  distal pulses bilaterally Neuro:  Alert and Oriented x 3 Psych: euthymic mood, full affect  Wt Readings from Last 3 Encounters:  08/12/20 186 lb (84.4 kg)  08/05/20 189 lb 9.5 oz (86 kg)  07/31/20 201 lb (91.2 kg)      Studies/Labs Reviewed:   EKG:  EKG is  ordered today.  The ekg ordered today demonstrates atrial fibrillation at 86 bpm with  ST-T wave changes similar to prior EKGs  Recent Labs: 07/31/2020: B Natriuretic Peptide 25.0 08/01/2020: ALT 84; Hemoglobin 12.4; Magnesium 2.0; Platelets 171; TSH 1.156 08/05/2020: BUN 35; Creatinine, Ser 1.57; Potassium 3.3; Sodium 139   Lipid Panel    Component Value Date/Time   CHOL 111 07/31/2020 1131   TRIG 62 07/31/2020 1131   HDL 38 (L) 07/31/2020 1131   CHOLHDL 2.9 07/31/2020 1131   LDLCALC 59 07/31/2020 1131    Additional studies/ records that were reviewed today include:  Echo 06/12/2020 IMPRESSIONS     1. Left ventricular ejection fraction, by estimation, is 20 to 25%. The  left ventricle has severely decreased function. The left ventricle  demonstrates global hypokinesis. There is mild left ventricular  hypertrophy. Left ventricular diastolic parameters   are indeterminate.   2. Right ventricular systolic function is mildly reduced. The right  ventricular size is moderately enlarged.   3. Left atrial size was severely dilated.   4. Right atrial size was mildly dilated.   5. The mitral valve is normal in structure. Trivial mitral valve  regurgitation. No evidence of mitral stenosis.   6. The aortic valve was not well visualized. There is mild calcification  of the aortic valve. There is mild thickening of the aortic valve. Aortic  valve regurgitation is not visualized. No aortic stenosis is present.   7. The inferior vena cava is normal in size with greater  than 50%  respiratory variability, suggesting right atrial pressure of 3 mmHg.   FINDINGS   Left Ventricle: Global hypokinesis, the anteroseptal wall is  akinetic.  Left ventricular ejection fraction, by estimation, is 20 to 25%. The left  ventricle has severely decreased function. The left ventricle demonstrates  global hypokinesis. Definity  contrast agent was given IV to delineate the left ventricular endocardial  borders. The left ventricular internal cavity size was normal in size.  There is mild left ventricular hypertrophy. Left ventricular diastolic  parameters are indeterminate.   Right Ventricle: The right ventricular size is moderately enlarged. Right  vetricular wall thickness was not assessed. Right ventricular systolic  function is mildly reduced.   Left Atrium: Left atrial size was severely dilated.   Right Atrium: Right atrial size was mildly dilated.   Pericardium: There is no evidence of pericardial effusion.   Mitral Valve: The mitral valve is normal in structure. Trivial mitral  valve regurgitation. No evidence of mitral valve stenosis.   Tricuspid Valve: The tricuspid valve is normal in structure. Tricuspid  valve regurgitation is mild . No evidence of tricuspid stenosis.   Aortic Valve: The aortic valve was not well visualized. There is mild  calcification of the aortic valve. There is mild thickening of the aortic  valve. There is mild aortic valve annular calcification. Aortic valve  regurgitation is not visualized. No  aortic stenosis is present. Aortic valve mean gradient measures 4.2 mmHg.  Aortic valve peak gradient measures 8.1 mmHg. Aortic valve area, by VTI  measures 1.80 cm.   Pulmonic Valve: The pulmonic valve was not well visualized. Pulmonic valve  regurgitation is not visualized. No evidence of pulmonic stenosis.   Aorta: The aortic root is normal in size and structure.   Pulmonary Artery: Indeterminant PASP, inadequate TR jet.   Venous: The inferior vena cava is normal in size with greater than 50%  respiratory variability, suggesting right atrial pressure of 3 mmHg.   IAS/Shunts: The  interatrial septum was not well visualized.      ASSESSMENT:    1. Chronic systolic CHF (congestive heart failure) (HCC)   2. Persistent atrial fibrillation (HCC)   3. Stage 3b chronic kidney disease ([place])   4. Essential hypertension      PLAN:  In order of problems listed above:  Chronic systolic CHF LVEF 20 to 78% on echo 06/12/2020 recently discharged after admission with CHF after missing his Lasix evening dose. Entresto stopped because of rising creatinine.  Creatinine 1.57 at discharge.  He has lost 15 pounds since discharge mostly because of nausea and no appetite.  I suspect this is secondary to the amiodarone.  Will make afternoon dose of Lasix 20 mg as needed for weight gain of 2 or 3 pounds overnight.  Check renal today.  Potassium was 3.3 at discharge and he was not sent home with any potassium.  Persistent atrial fibrillation with RVR with failed cardioversion 07/16/2020 now on amiodarone and Toprol.  Dr. Harl Bowie recommend repeat cardioversion in 2 weeks.  If labs are stable and potassium normal we will schedule cardioversion for next week.  The risks (stroke, cardiac arrhythmias rarely resulting in the need for a temporary or permanent pacemaker, skin irritation or burns and complications associated with conscious sedation including aspiration, arrhythmia, respiratory failure and death), benefits (restoration of normal sinus rhythm) and alternatives of a direct current cardioversion were explained in detail to Mr. Towell and he agrees to proceed.   CKD stage IIIb Crt 1.57  on 08/05/2020-Entresto stopped in the hospital. Plan to  decrease Lasix to 40 mg once daily then 20 mg as needed in the afternoon for weight gain of 2 or 3 pounds overnight.   Medication Adjustments/Labs and Tests Ordered: Current medicines are reviewed at length with the patient today.  Concerns regarding medicines are outlined above.  Medication changes, Labs and Tests ordered today are listed in the Patient  Instructions below. Patient Instructions  Medication Instructions:  Your physician recommends that you continue on your current medications as directed. Please refer to the Current Medication list given to you today.  *If you need a refill on your cardiac medications before your next appointment, please call your pharmacy*   Lab Work: Your physician recommends that you return for lab work in: Today   If you have labs (blood work) drawn today and your tests are completely normal, you will receive your results only by: Marland Kitchen MyChart Message (if you have MyChart) OR . A paper copy in the mail If you have any lab test that is abnormal or we need to change your treatment, we will call you to review the results.   Testing/Procedures: Your physician has recommended that you have a Cardioversion (DCCV). Electrical Cardioversion uses a jolt of electricity to your heart either through paddles or wired patches attached to your chest. This is a controlled, usually prescheduled, procedure. Defibrillation is done under light anesthesia in the hospital, and you usually go home the day of the procedure. This is done to get your heart back into a normal rhythm. You are not awake for the procedure. Please see the instruction sheet given to you today.   Follow-Up: At [facility], you and your health needs are our priority.  As part of our continuing mission to provide you with exceptional heart care, we have created designated Provider Care Teams.  These Care Teams include your primary Cardiologist (physician) and Advanced Practice Providers (APPs -  Physician Assistants and Nurse Practitioners) who all work together to provide you with the care you need, when you need it.  We recommend signing up for the patient portal called "MyChart".  Sign up information is provided on this After Visit Summary.  MyChart is used to connect with patients for Virtual Visits (Telemedicine).  Patients are able to view lab/test  results, encounter notes, upcoming appointments, etc.  Non-urgent messages can be sent to your provider as well.   To learn more about what you can do with MyChart, go to [email or web address].    Your next appointment:    As Planned   The format for your next appointment:   In Person  Provider:   Carlyle Dolly, MD   Other Instructions Thank you for choosing [facility]!       Sumner Boast, PA-C  08/12/2020 11:51 AM    [facility] [address], [place], [place]  [address] Phone: [phone]; Fax: [phone]

## 2020-08-12 ENCOUNTER — Encounter: Payer: Self-pay | Admitting: *Deleted

## 2020-08-12 ENCOUNTER — Encounter: Payer: Self-pay | Admitting: Physician Assistant

## 2020-08-12 ENCOUNTER — Other Ambulatory Visit (HOSPITAL_COMMUNITY)
Admission: RE | Admit: 2020-08-12 | Discharge: 2020-08-12 | Disposition: A | Discharge status: Home / Self Care | Payer: Medicare Other | Source: Ambulatory Visit | Attending: Physician Assistant | Admitting: Physician Assistant

## 2020-08-12 ENCOUNTER — Other Ambulatory Visit: Payer: Self-pay

## 2020-08-12 ENCOUNTER — Ambulatory Visit: Payer: Medicare Other | Admitting: Physician Assistant

## 2020-08-12 VITALS — BP 110/68 | HR 70 | Ht 72.0 in | Wt 186.0 lb

## 2020-08-12 DIAGNOSIS — N1832 Chronic kidney disease, stage 3b: Secondary | ICD-10-CM | POA: Insufficient documentation

## 2020-08-12 DIAGNOSIS — I4819 Other persistent atrial fibrillation: Secondary | ICD-10-CM | POA: Insufficient documentation

## 2020-08-12 DIAGNOSIS — I10 Essential (primary) hypertension: Secondary | ICD-10-CM | POA: Diagnosis not present

## 2020-08-12 DIAGNOSIS — I5022 Chronic systolic (congestive) heart failure: Secondary | ICD-10-CM

## 2020-08-12 LAB — CBC
HCT: 43 % (ref 39.0–52.0)
Hemoglobin: 13.3 g/dL (ref 13.0–17.0)
MCH: 27.9 pg (ref 26.0–34.0)
MCHC: 30.9 g/dL (ref 30.0–36.0)
MCV: 90.1 fL (ref 80.0–100.0)
Platelets: 329 10*3/uL (ref 150–400)
RBC: 4.77 MIL/uL (ref 4.22–5.81)
RDW: 15.6 % — ABNORMAL HIGH (ref 11.5–15.5)
WBC: 6.2 10*3/uL (ref 4.0–10.5)
nRBC: 0 % (ref 0.0–0.2)

## 2020-08-12 LAB — BASIC METABOLIC PANEL
Anion gap: 7 (ref 5–15)
BUN: 24 mg/dL — ABNORMAL HIGH (ref 8–23)
CO2: 27 mmol/L (ref 22–32)
Calcium: 8.2 mg/dL — ABNORMAL LOW (ref 8.9–10.3)
Chloride: 101 mmol/L (ref 98–111)
Creatinine, Ser: 1.59 mg/dL — ABNORMAL HIGH (ref 0.61–1.24)
GFR, Estimated: 45 mL/min — ABNORMAL LOW (ref >60)
Glucose, Bld: 90 mg/dL (ref 70–99)
Potassium: 3.9 mmol/L (ref 3.5–5.1)
Sodium: 135 mmol/L (ref 135–145)

## 2020-08-12 NOTE — Patient Instructions (Signed)
Medication Instructions:  Your physician recommends that you continue on your current medications as directed. Please refer to the Current Medication list given to you today.  *If you need a refill on your cardiac medications before your next appointment, please call your pharmacy*   Lab Work: Your physician recommends that you return for lab work in: Today   If you have labs (blood work) drawn today and your tests are completely normal, you will receive your results only by:  MyChart Message (if you have MyChart) OR  A paper copy in the mail If you have any lab test that is abnormal or we need to change your treatment, we will call you to review the results.   Testing/Procedures: Your physician has recommended that you have a Cardioversion (DCCV). Electrical Cardioversion uses a jolt of electricity to your heart either through paddles or wired patches attached to your chest. This is a controlled, usually prescheduled, procedure. Defibrillation is done under light anesthesia in the hospital, and you usually go home the day of the procedure. This is done to get your heart back into a normal rhythm. You are not awake for the procedure. Please see the instruction sheet given to you today.   Follow-Up: At [facility], you and your health needs are our priority.  As part of our continuing mission to provide you with exceptional heart care, we have created designated Provider Care Teams.  These Care Teams include your primary Cardiologist (physician) and Advanced Practice Providers (APPs -  Physician Assistants and Nurse Practitioners) who all work together to provide you with the care you need, when you need it.  We recommend signing up for the patient portal called "MyChart".  Sign up information is provided on this After Visit Summary.  MyChart is used to connect with patients for Virtual Visits (Telemedicine).  Patients are able to view lab/test results, encounter notes, upcoming  appointments, etc.  Non-urgent messages can be sent to your provider as well.   To learn more about what you can do with MyChart, go to [email or web address].    Your next appointment:    As Planned   The format for your next appointment:   In Person  Provider:   Carlyle Dolly, MD   Other Instructions Thank you for choosing [facility]!

## 2020-08-13 ENCOUNTER — Telehealth: Payer: Self-pay | Admitting: *Deleted

## 2020-08-13 DIAGNOSIS — N1832 Chronic kidney disease, stage 3b: Secondary | ICD-10-CM

## 2020-08-13 MED ORDER — POTASSIUM CHLORIDE CRYS ER 20 MEQ PO TBCR: 20.0000 meq | EXTENDED_RELEASE_TABLET | Freq: Every day | ORAL | 3 refills | Status: DC

## 2020-08-13 NOTE — Telephone Encounter (Signed)
-----   Message from Imogene Burn, PA-C sent at 08/12/2020  3:26 PM EST ----- Kidney function still mildly elevated similar to 1 week ago. Potassium normal but would start low dose Kdur 20 meq to take with daily am lasix.Marland Kitchen Please reiterated to stop afternoon lasix of 20 mg and only take if needed. Ok to proceed with cardioversion next week but would check bmet stat that am or the day before. Thanks. Can reschedule appt with Dr. Harl Bowie for 1-2 months

## 2020-08-18 NOTE — Patient Instructions (Signed)
NOELLE HOOGLAND  08/18/2020     @PREFPERIOPPHARMACY @   Your procedure is scheduled on  08/21/2020.  Report to [facility] at  0800  A.M.  Call this number if you have problems the morning of surgery:  [phone]   Remember:  Do not eat or drink after midnight.                         Take these medicines the morning of surgery with A SIP OF WATER  Pacerone,metoprolol, zyrtec, hydralazine, imdur. DO NOT miss any doses of your eliquis.    Do not wear jewelry, make-up or nail polish.  Do not wear lotions, powders, or perfumes. Please wear deodorant and brush your teeth.  Do not shave 48 hours prior to surgery.  Men may shave face and neck.  Do not bring valuables to the hospital.  [facility] is not responsible for any belongings or valuables.  Contacts, dentures or bridgework may not be worn into surgery.  Leave your suitcase in the car.  After surgery it may be brought to your room.  For patients admitted to the hospital, discharge time will be determined by your treatment team.  Patients discharged the day of surgery will not be allowed to drive home.   Name and phone number of your driver:   family Special instructions:  DO NOT smoke the morning of your procedure.  Please read over the following fact sheets that you were given. Anesthesia Post-op Instructions and Care and Recovery After Surgery       Electrical Cardioversion Electrical cardioversion is the delivery of a jolt of electricity to restore a normal rhythm to the heart. A rhythm that is too fast or is not regular keeps the heart from pumping well. In this procedure, sticky patches or metal paddles are placed on the chest to deliver electricity to the heart from a device. This procedure may be done in an emergency if:  There is low or no blood pressure as a result of the heart rhythm.  Normal rhythm must be restored as fast as possible to protect the brain and heart from further damage.  It may save a  life. This may also be a scheduled procedure for irregular or fast heart rhythms that are not immediately life-threatening. Tell a health care provider about:  Any allergies you have.  All medicines you are taking, including vitamins, herbs, eye drops, creams, and over-the-counter medicines.  Any problems you or family members have had with anesthetic medicines.  Any blood disorders you have.  Any surgeries you have had.  Any medical conditions you have.  Whether you are pregnant or may be pregnant. What are the risks? Generally, this is a safe procedure. However, problems may occur, including:  Allergic reactions to medicines.  A blood clot that breaks free and travels to other parts of your body.  The possible return of an abnormal heart rhythm within hours or days after the procedure.  Your heart stopping (cardiac arrest). This is rare. What happens before the procedure? Medicines  Your health care provider may have you start taking: ? Blood-thinning medicines (anticoagulants) so your blood does not clot as easily. ? Medicines to help stabilize your heart rate and rhythm.  Ask your health care provider about: ? Changing or stopping your regular medicines. This is especially important if you are taking diabetes medicines or blood thinners. ? Taking medicines such as aspirin  and ibuprofen. These medicines can thin your blood. Do not take these medicines unless your health care provider tells you to take them. ? Taking over-the-counter medicines, vitamins, herbs, and supplements. General instructions  Follow instructions from your health care provider about eating or drinking restrictions.  Plan to have someone take you home from the hospital or clinic.  If you will be going home right after the procedure, plan to have someone with you for 24 hours.  Ask your health care provider what steps will be taken to help prevent infection. These may include washing your skin with  a germ-killing soap. What happens during the procedure?   An IV will be inserted into one of your veins.  Sticky patches (electrodes) or metal paddles may be placed on your chest.  You will be given a medicine to help you relax (sedative).  An electrical shock will be delivered. The procedure may vary among health care providers and hospitals. What can I expect after the procedure?  Your blood pressure, heart rate, breathing rate, and blood oxygen level will be monitored until you leave the hospital or clinic.  Your heart rhythm will be watched to make sure it does not change.  You may have some redness on the skin where the shocks were given. Follow these instructions at home:  Do not drive for 24 hours if you were given a sedative during your procedure.  Take over-the-counter and prescription medicines only as told by your health care provider.  Ask your health care provider how to check your pulse. Check it often.  Rest for 48 hours after the procedure or as told by your health care provider.  Avoid or limit your caffeine use as told by your health care provider.  Keep all follow-up visits as told by your health care provider. This is important. Contact a health care provider if:  You feel like your heart is beating too quickly or your pulse is not regular.  You have a serious muscle cramp that does not go away. Get help right away if:  You have discomfort in your chest.  You are dizzy or you feel faint.  You have trouble breathing or you are short of breath.  Your speech is slurred.  You have trouble moving an arm or leg on one side of your body.  Your fingers or toes turn cold or blue. Summary  Electrical cardioversion is the delivery of a jolt of electricity to restore a normal rhythm to the heart.  This procedure may be done right away in an emergency or may be a scheduled procedure if the condition is not an emergency.  Generally, this is a safe  procedure.  After the procedure, check your pulse often as told by your health care provider. This information is not intended to replace advice given to you by your health care provider. Make sure you discuss any questions you have with your health care provider. Document Revised: 04/09/2019 Document Reviewed: 04/09/2019 Elsevier Patient Education  [place] After These instructions provide you with information about caring for yourself after your procedure. Your health care provider may also give you more specific instructions. Your treatment has been planned according to current medical practices, but problems sometimes occur. Call your health care provider if you have any problems or questions after your procedure. What can I expect after the procedure? After your procedure, you may:  Feel sleepy for several hours.  Feel clumsy and have poor  balance for several hours.  Feel forgetful about what happened after the procedure.  Have poor judgment for several hours.  Feel nauseous or vomit.  Have a sore throat if you had a breathing tube during the procedure. Follow these instructions at home: For at least 24 hours after the procedure:      Have a responsible adult stay with you. It is important to have someone help care for you until you are awake and alert.  Rest as needed.  Do not: ? Participate in activities in which you could fall or become injured. ? Drive. ? Use heavy machinery. ? Drink alcohol. ? Take sleeping pills or medicines that cause drowsiness. ? Make important decisions or sign legal documents. ? Take care of children on your own. Eating and drinking  Follow the diet that is recommended by your health care provider.  If you vomit, drink water, juice, or soup when you can drink without vomiting.  Make sure you have little or no nausea before eating solid foods. General instructions  Take over-the-counter and  prescription medicines only as told by your health care provider.  If you have sleep apnea, surgery and certain medicines can increase your risk for breathing problems. Follow instructions from your health care provider about wearing your sleep device: ? Anytime you are sleeping, including during daytime naps. ? While taking prescription pain medicines, sleeping medicines, or medicines that make you drowsy.  If you smoke, do not smoke without supervision.  Keep all follow-up visits as told by your health care provider. This is important. Contact a health care provider if:  You keep feeling nauseous or you keep vomiting.  You feel light-headed.  You develop a rash.  You have a fever. Get help right away if:  You have trouble breathing. Summary  For several hours after your procedure, you may feel sleepy and have poor judgment.  Have a responsible adult stay with you for at least 24 hours or until you are awake and alert. This information is not intended to replace advice given to you by your health care provider. Make sure you discuss any questions you have with your health care provider. Document Revised: 12/05/2017 Document Reviewed: 12/28/2015 Elsevier Patient Education  [place].

## 2020-08-19 ENCOUNTER — Other Ambulatory Visit (HOSPITAL_COMMUNITY)
Admission: RE | Admit: 2020-08-19 | Discharge: 2020-08-19 | Disposition: A | Discharge status: Home / Self Care | Payer: Medicare Other | Source: Ambulatory Visit | Attending: Cardiology | Admitting: Cardiology

## 2020-08-19 ENCOUNTER — Ambulatory Visit (INDEPENDENT_AMBULATORY_CARE_PROVIDER_SITE_OTHER): Payer: Medicare Other | Admitting: Family Medicine

## 2020-08-19 ENCOUNTER — Encounter (HOSPITAL_COMMUNITY)
Admission: RE | Admit: 2020-08-19 | Discharge: 2020-08-19 | Disposition: A | Discharge status: Home / Self Care | Payer: Medicare Other | Source: Ambulatory Visit | Attending: Cardiology | Admitting: Cardiology

## 2020-08-19 ENCOUNTER — Encounter: Payer: Self-pay | Admitting: Family Medicine

## 2020-08-19 ENCOUNTER — Encounter (HOSPITAL_COMMUNITY): Payer: Self-pay

## 2020-08-19 ENCOUNTER — Telehealth: Payer: Self-pay | Admitting: Nurse Practitioner

## 2020-08-19 ENCOUNTER — Other Ambulatory Visit: Payer: Self-pay

## 2020-08-19 VITALS — BP 118/70 | HR 82 | Temp 98.4°F | Ht 72.0 in | Wt 184.0 lb

## 2020-08-19 DIAGNOSIS — Z7689 Persons encountering health services in other specified circumstances: Secondary | ICD-10-CM

## 2020-08-19 DIAGNOSIS — I5022 Chronic systolic (congestive) heart failure: Secondary | ICD-10-CM | POA: Diagnosis not present

## 2020-08-19 DIAGNOSIS — I4819 Other persistent atrial fibrillation: Secondary | ICD-10-CM | POA: Diagnosis not present

## 2020-08-19 DIAGNOSIS — Z20822 Contact with and (suspected) exposure to covid-19: Secondary | ICD-10-CM | POA: Insufficient documentation

## 2020-08-19 DIAGNOSIS — N1832 Chronic kidney disease, stage 3b: Secondary | ICD-10-CM

## 2020-08-19 DIAGNOSIS — Z01818 Encounter for other preprocedural examination: Secondary | ICD-10-CM | POA: Insufficient documentation

## 2020-08-19 DIAGNOSIS — I10 Essential (primary) hypertension: Secondary | ICD-10-CM

## 2020-08-19 HISTORY — DX: Non-Hodgkin lymphoma, unspecified, unspecified site: C85.90

## 2020-08-19 LAB — BASIC METABOLIC PANEL
Anion gap: 9 (ref 5–15)
BUN: 27 mg/dL — ABNORMAL HIGH (ref 8–23)
CO2: 24 mmol/L (ref 22–32)
Calcium: 8.6 mg/dL — ABNORMAL LOW (ref 8.9–10.3)
Chloride: 102 mmol/L (ref 98–111)
Creatinine, Ser: 1.81 mg/dL — ABNORMAL HIGH (ref 0.61–1.24)
GFR, Estimated: 39 mL/min — ABNORMAL LOW (ref >60)
Glucose, Bld: 98 mg/dL (ref 70–99)
Potassium: 4.4 mmol/L (ref 3.5–5.1)
Sodium: 135 mmol/L (ref 135–145)

## 2020-08-19 LAB — PROTIME-INR
INR: 1.6 — ABNORMAL HIGH (ref 0.8–1.2)
Prothrombin Time: 18.7 seconds — ABNORMAL HIGH (ref 11.4–15.2)

## 2020-08-19 NOTE — Telephone Encounter (Signed)
Spoke with patient's wife, [name], regarding Palliative referral/services and she was in agreement with scheduling visit.  I have scheduled a Telephone Consult for 08/20/20 @ 3:30 PM.

## 2020-08-19 NOTE — Assessment & Plan Note (Signed)
Advised to follow with cardiology.  Not to skip any doses of his Lasix.  Follow-up with them for any changes.

## 2020-08-19 NOTE — Patient Instructions (Addendum)
I appreciate the opportunity to provide you with care for your health and wellness. Today we discussed: recent hospital stay  Follow up: 3 months    No labs or referrals today  Take care of yourself, try to avoid skipping doses of your medications.  Try to each every few hours.  Take medication with food. Use ensure or boost to drink over the next few hours you have nausea.   HAVE A WONDERFUL MERRY CHRISTMAS AND HAPPY NEW YEAR :)  Please continue to practice social distancing to keep you, your family, and our community safe.  If you must go out, please wear a mask and practice good handwashing.  It was a pleasure to see you and I look forward to continuing to work together on your health and well-being. Please do not hesitate to call the office if you need care or have questions about your care.  Have a wonderful day and week. With Gratitude, Cherly Beach, DNP, AGNP-BC

## 2020-08-19 NOTE — Assessment & Plan Note (Signed)
Extensive review of notes, orders, labs and medication changes.  Continue physical therapy.  Has appointment with cardiology tomorrow.  For cardioversion I will be on the second.  Additionally has palliative care appointment coming up as well.  No updated labs ordered today since he is currently having labs for his cardioversion.

## 2020-08-19 NOTE — Progress Notes (Signed)
Subjective:  Patient ID: Darius Smith, male    DOB: 1945/11/21  Age: 74 y.o. MRN: [id number]  CC:  Chief Complaint  Patient presents with   Hospitalization Follow-up      HPI  HPI  Darius Smith is a pleasant  79yom PMH including afib, CHF, follicular lymphoma, admitted for acute hypoxic resp failure secondary CHF.  Presented to the emergency room on 11/11 via EMS from home.  History provided by wife.  She reports that he was not feeling very well and had weakness within the last 12 hours.  She reports that he started coughing up white foam and gagging.  He had no appetite the day after the day before.  EMS arrived placed him on 2 L of oxygen increased to 3 L in the emergency room.  He was very dyspneic worse on exertion but still present at rest.  He did improve tremendously with the treatments provided in the emergency room.  He denied having chest pain or palpitations.  He reported some nausea that happened over the last couple times during the days before coming in.  He did not notice any leg swelling.  And when he does notice leg swelling he was to take an extra diuretic.  He denies having any fevers or pains.  Is not a smoker.  Did not use illicit drugs.  He has been vaccinated for Covid and the flu.  Recently had filled out DNR paperwork.  Last echo was in September 2021 with an EF of 20-25 hypokinesis of the left ventricle wall.  EKG in emergency room showed heart rate of 109 A. fib.  Adjustment to medications have been made prior to him coming into the hospital.  Toprol was increased to 150 mg daily and amiodarone was lowered to 200 mg daily with the plans to try to recardiovert after the amiodarone reload.  There is plan as to whether or not we need to do a cardiac catheterization and repeat echo if cardioversion was successful. He ended up being admitted to the hospital after skipping an evening dose of Lasix.  4.3 L of fluid was diuresed off him.  His creatinine was 1.57 at  discharge.  Hospital treatments included acute hypoxic respiratory failure secondary to acute on chronic systolic congestive heart failure.  Secondary to skipping doses of diuretic at night.  He is aware of when to take his diuretics at this time.  Recently started on potassium per cardiology secondary to keeping potassium in normal range.  Doing well does not come in today on oxygen.  He is continue to lose weight since discharge.  Continues to have decreased appetite and nausea secondary to having one of his medications causing upset stomach. 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   A. fib was controlled while inpatient continue on as apixaban and amiodarone proceed with Dr. Harl Bowie for outpatient cardioversion-December 2.  He is continued on his beta-blocker and Imdur.  Palliative was consulted for symptom management in addition to helping prevent readmission.  Will have an appointment with them on December 1.  Today overall he reports that he is feeling much better.  He is even sleeping better.  Has not needed to take the sleep medicine.  Reviewing back it looks like he probably had air hunger that was causing him not to be able to sleep.  Secondary to him not taking his nighttime dose of diuretic.  He reports nausea with appetite change.  Secondary to medications  that he is taking for his heart.  Given that Phenergan can make him unstable with his mobility and he feels like he has nausea every day I do not think this is a good choice.  Additionally Zofran would not be a good choice secondary to the QT prolongation.  I have advised for him to follow-up with Dr. Harl Bowie in person when he sees him tomorrow to see what his thoughts are.  Today in office he denies having any active chest pain, cough, shortness of breath, leg swelling, sleep trouble, headaches, dizziness.  He reports he is doing really well working with the therapist.  Him and his wife are both very pleased with the  process of which they are going through right now to get him feeling better.  Today patient denies signs and symptoms of COVID 19 infection including fever, chills, cough, shortness of breath, and headache. Past Medical, Surgical, Social History, Allergies, and Medications have been Reviewed.   Past Medical History:  Diagnosis Date   Allergic rhinitis 07/02/2019   Arthritis    both hands   Atrial fibrillation ([place]) 10/28/2017   Dr. Harl Bowie   Atrial fibrillation with RVR ([place]) 07/14/2020   Blood in urine    occ   Bronchitis    Cancer ([place])    Basal cell   Cardiomegaly 21/19/4174   Complication of anesthesia    Darius. Seyfried developed a. fib in recovery after cysto procedure 10/2017 was transferred to [facility] and underwent successful cardioversion   Diverticulosis of sigmoid colon 08/2017   Noted on colonoscopy   Dysrhythmia    External hemorrhoids 08/2017   GERD (gastroesophageal reflux disease)    History of colon polyps 08/2017   History of kidney stones    History of rectal bleeding    History of right inguinal hernia    Hypertension    Nephrolithiasis 07/02/2019   Rectal bleeding 09/02/2017   Added automatically from request for surgery [id number]   Shortness of breath     Current Meds  Medication Sig   acetaminophen (TYLENOL) 500 MG tablet Take 1,000 mg by mouth every 6 (six) hours as needed for moderate pain or headache.   amiodarone (PACERONE) 200 MG tablet Take 1 tablet (200 mg total) by mouth daily.   cetirizine (ZYRTEC) 10 MG tablet Take 1 tablet (10 mg total) by mouth daily as needed for allergies.   ELIQUIS 5 MG TABS tablet TAKE ONE TABLET BY MOUTH 2 TIMES A DAY. (Patient taking differently: Take 5 mg by mouth 2 (two) times daily. )   furosemide (LASIX) 40 MG tablet 40mg  in AM, 20mg  at 4 pm.   hydrALAZINE (APRESOLINE) 25 MG tablet Take 1 tablet (25 mg total) by mouth 3 (three) times daily.   isosorbide mononitrate (IMDUR) 30 MG 24 hr tablet Take  0.5 tablets (15 mg total) by mouth daily.   metoprolol succinate (TOPROL-XL) 100 MG 24 hr tablet Take 1 tablet (100 mg total) by mouth daily. Take with or immediately following a meal. (Patient taking differently: Take 100 mg by mouth daily. Take with Toprol XL 50 mg to equal 150 mg Daily)   metoprolol succinate (TOPROL-XL) 50 MG 24 hr tablet Take 1 tablet (50 mg total) by mouth daily. Take with or immediately following a meal. Take with Toprol XL 100 mg to equal 150 mg Daily (Patient taking differently: Take 50 mg by mouth daily. Take with Toprol XL 100 mg to equal 150 mg Daily)   potassium chloride SA (KLOR-CON) 20  MEQ tablet Take 1 tablet (20 mEq total) by mouth daily.   temazepam (RESTORIL) 7.5 MG capsule Take 1 capsule (7.5 mg total) by mouth at bedtime as needed for sleep.   VENTOLIN HFA 108 (90 Base) MCG/ACT inhaler Inhale 2 puffs into the lungs every 6 (six) hours as needed for wheezing or shortness of breath.    ROS:  Review of Systems  Constitutional: Negative.   HENT: Negative.   Eyes: Negative.   Respiratory: Negative.   Cardiovascular: Negative.   Gastrointestinal: Negative.   Genitourinary: Negative.   Musculoskeletal: Negative.   Skin: Negative.   Neurological: Negative.   Endo/Heme/Allergies: Negative.   Psychiatric/Behavioral: Negative.      Objective:   Today's Vitals: BP 118/70 (BP Location: Right Arm, Patient Position: Sitting, Cuff Size: Normal)    Pulse 82    Temp 98.4 F (36.9 C) (Temporal)    Ht 6' (1.829 m)    Wt 184 lb (83.5 kg)    SpO2 99%    BMI 24.95 kg/m  Vitals with BMI 08/19/2020 08/12/2020 08/05/2020  Height 6\' 0"  6\' 0"  -  Weight 184 lbs 186 lbs 189 lbs 10 oz  BMI 24.95 02.40 97.35  Systolic 329 924 268  Diastolic 70 68 341  Pulse 82 70 99     Physical Exam Vitals and nursing note reviewed.  Constitutional:      Appearance: Normal appearance. He is well-developed, well-groomed and normal weight.  HENT:     Head: Normocephalic and  atraumatic.     Right Ear: External ear normal.     Left Ear: External ear normal.     Mouth/Throat:     Comments: Mask in place  Eyes:     General:        Right eye: No discharge.        Left eye: No discharge.     Conjunctiva/sclera: Conjunctivae normal.  Cardiovascular:     Rate and Rhythm: Normal rate and regular rhythm.     Pulses: Normal pulses.     Heart sounds: Normal heart sounds.  Pulmonary:     Effort: Pulmonary effort is normal.     Breath sounds: Normal breath sounds.  Musculoskeletal:        General: Normal range of motion.     Cervical back: Normal range of motion and neck supple.  Skin:    General: Skin is warm.  Neurological:     General: No focal deficit present.     Mental Status: He is alert and oriented to person, place, and time.  Psychiatric:        Attention and Perception: Attention normal.        Mood and Affect: Mood normal.        Speech: Speech normal.        Behavior: Behavior normal. Behavior is cooperative.        Thought Content: Thought content normal.        Cognition and Memory: Cognition normal.        Judgment: Judgment normal.     Assessment   1. Encounter for support and coordination of transition of care   2. Persistent atrial fibrillation ([place])   3. Chronic systolic HF (heart failure) ([facility])   4. Stage 3b chronic kidney disease ([place])   5. Essential hypertension     Tests ordered No orders of the defined types were placed in this encounter.    Plan: Please see assessment and plan per problem list above.  No orders of the defined types were placed in this encounter.   Patient to follow-up in 11/18/2020   Note: This dictation was prepared with Dragon dictation along with smaller phrase technology. Similar sounding words can be transcribed inadequately or may not be corrected upon review. Any transcriptional errors that result from this process are unintentional.      Perlie Mayo, NP

## 2020-08-19 NOTE — Assessment & Plan Note (Signed)
Controlled continue all medications as per cardiology.  Exercise is encouraged.  Low-salt diet is also encouraged.

## 2020-08-19 NOTE — Assessment & Plan Note (Signed)
Followed closely by cardiology.  Continue medication at this time.  Heart rate in the 80s at this time.  Has cardioversion planned for 2 December.

## 2020-08-19 NOTE — Assessment & Plan Note (Signed)
Labs stable and back to baseline prior to discharge.  We will continue to monitor them through treatments.

## 2020-08-20 ENCOUNTER — Ambulatory Visit: Payer: Medicare Other | Admitting: Cardiology

## 2020-08-20 ENCOUNTER — Encounter: Payer: Self-pay | Admitting: Nurse Practitioner

## 2020-08-20 ENCOUNTER — Other Ambulatory Visit: Payer: Self-pay | Admitting: Nurse Practitioner

## 2020-08-20 DIAGNOSIS — Z515 Encounter for palliative care: Secondary | ICD-10-CM | POA: Diagnosis not present

## 2020-08-20 DIAGNOSIS — I5022 Chronic systolic (congestive) heart failure: Secondary | ICD-10-CM

## 2020-08-20 LAB — SARS CORONAVIRUS 2 (TAT 6-24 HRS): SARS Coronavirus 2: NEGATIVE

## 2020-08-20 NOTE — Progress Notes (Signed)
[facility] Consult Note Telephone: [phone]  Fax: [phone]  PATIENT NAME: Darius Smith DOB: August 26, 1946 MRN: [id number]  PRIMARY CARE PROVIDER:   Perlie Mayo, NP  REFERRING PROVIDER:  Perlie Mayo, NP [place],  [place] [id number]  RESPONSIBLE PARTY:   Self  Due to the COVID-19 crisis, this visit was done via telemedicine from my office and it was initiated and consent by this patient and or family.  I was asked to see Darius Smith by Ms. Jerelene Redden NP for Palliative care consult for complex medical decision making.  1. Advance Care Planning; DNR; Ms. Doxtater wishes to complete golden rod form with next PC visit in the home in 2 weeks; living will in vynca  2. Goals of Care: Goals include to maximize quality of life and symptom management. Our advance care planning conversation included a discussion about:     The value and importance of advance care planning   Exploration of personal, cultural or spiritual beliefs that might influence medical decisions   Exploration of goals of care in the event of a sudden injury or illness   Identification and preparation of a healthcare agent   Review and updating or creation of an  advance directive document.  3. Palliative care encounter; Palliative care encounter; Palliative medicine team will continue to support patient, patient's family, and medical team. Visit consisted of counseling and education dealing with the complex and emotionally intense issues of symptom management and palliative care in the setting of serious and potentially life-threatening illness  4. f/u 2 weeks after cardioversion for ongoing discussion of goc  I spent 55 minutes providing this consultation,  from 3:15pm to 4:10pm. More than 50% of the time in this consultation was spent coordinating communication.   HISTORY OF PRESENT ILLNESS:  [name] is a 74 y.o. year old male with multiple medical  problems including Cardiomegaly with ef 20 to 25%, atrial fibrillation, lymphoma, hypertension, chronic systolic congestive heart failure, gerd, chronic kidney disease, history of kidney stone, arthritis, cholecystectomy, history of cardioversion.  Initial palliative care visit for Darius Smith for complex medical decision-making telemedicine telephonic as video not available. I called Darius Smith, Darius Smith wife for visit. We talked about purpose of Palliative care. We talked about past medical history Darius Smith was currently resting. Darius Smith endorses the last Palliative discussion during hospitalization Darius Smith became upset talking about prognosis, code status, living will. We talked about recent hospitalization. We talked about cardiomegaly with ef 20 to 25%. We talked about atrial fibrillation with upcoming cardioversion for tomorrow and day surgery. We talked about symptoms of pain, shortness of breath. Darius Smith endorses Darius Smith did walk halfway to the mailbox today with is a big accomplishment for him. Darius Smith has not been able to walk any types of distance. We talked about realistic expectations with disease progression. We talked about his appetite declining and weight loss. We talked about chronic nausea which he has been experiencing. We talked about medications. Darius Smith endorses Darius Smith Nurse Practitioner was going to further discuss with Cardiology what would be a good option for an antiemetic concerned with his EF, A-fib. We talked about life review. Darius Smith endorses they have been married over 47 years and have two children grown. History is retired and they live on [place] acres, have dogs and chickens. We talked about quality of life. We reviewed medical goals of care with wishes for  cardioversion tomorrow and hope said that it is successful. Previous cardioversion was not. We talked about code status, Darius Smith is a DNR, on his living will which is loaded in think up. We talked about  Goldenrod form. Mrs. Lashley endorses they do not have that in their home but would be agreeable to complete when Palliative comes for in-person visit. Explained that EMS would need a DNR form in the home to be able to honor that. Offer to complete the form in mail. Mrs. Prajapati she will wait until next week until palliative care in person visit. We talked about role of Palliative care and plan of care. We talked about follow-up Palliative care visit in one week for further discussion after cardioversion of medical complexity decision-making. Appointment scheduled. Therapeutic listening, emotional support provided. Contact information. Questions answered to satisfaction.  Palliative Care was asked to help address goals of care.   CODE STATUS: DNR  PPS: 50% HOSPICE ELIGIBILITY/DIAGNOSIS: TBD  PAST MEDICAL HISTORY:  Past Medical History:  Diagnosis Date  . Allergic rhinitis 07/02/2019  . Arthritis    both hands  . Atrial fibrillation ([facility]) 10/28/2017   Dr. Harl Bowie  . Atrial fibrillation with RVR ([facility]) 07/14/2020  . Blood in urine    occ  . Bronchitis   . Cancer ([facility])    Basal cell  . Cardiomegaly 10/28/2017  . Complication of anesthesia    Darius Smith developed a. fib in recovery after cysto procedure 10/2017 was transferred to [facility] and underwent successful cardioversion  . Diverticulosis of sigmoid colon 08/2017   Noted on colonoscopy  . Dysrhythmia   . External hemorrhoids 08/2017  . GERD (gastroesophageal reflux disease)   . History of colon polyps 08/2017  . History of kidney stones   . History of rectal bleeding   . History of right inguinal hernia   . Hypertension   . Lymphoma ([facility])   . Nephrolithiasis 07/02/2019  . Rectal bleeding 09/02/2017   Added automatically from request for surgery [phone]  . Shortness of breath     SOCIAL HX:  Social History   Tobacco Use  . Smoking status: Never Smoker  . Smokeless tobacco: Never Used  Substance Use Topics  . Alcohol use: Not  Currently    Alcohol/week: 7.0 standard drinks    Types: 7 Cans of beer per week    ALLERGIES:  Allergies  Allergen Reactions  . Augmentin [Amoxicillin-Pot Clavulanate] Other (See Comments)    Increased liver enzymes Has patient had a PCN reaction causing immediate rash, facial/tongue/throat swelling, SOB or lightheadedness with hypotension: No Has patient had a PCN reaction causing severe rash involving mucus membranes or skin necrosis: No Has patient had a PCN reaction that required hospitalization: No Has patient had a PCN reaction occurring within the last 10 years: No  If all of the above answers are "NO", then may proceed with Cephalosporin use.   Marland Kitchen Flomax [Tamsulosin Hcl] Nausea Only    Dizziness  Pt is on flomax     PERTINENT MEDICATIONS:  Outpatient Encounter Medications as of 08/20/2020  Medication Sig  . acetaminophen (TYLENOL) 500 MG tablet Take 1,000 mg by mouth every 6 (six) hours as needed for moderate pain or headache.  Marland Kitchen amiodarone (PACERONE) 200 MG tablet Take 1 tablet (200 mg total) by mouth daily.  . cetirizine (ZYRTEC) 10 MG tablet Take 1 tablet (10 mg total) by mouth daily as needed for allergies.  Marland Kitchen ELIQUIS 5 MG TABS tablet TAKE ONE TABLET BY MOUTH  2 TIMES A DAY. (Patient taking differently: Take 5 mg by mouth 2 (two) times daily. )  . furosemide (LASIX) 40 MG tablet 40mg  in AM, 20mg  at 4 pm.  . hydrALAZINE (APRESOLINE) 25 MG tablet Take 1 tablet (25 mg total) by mouth 3 (three) times daily.  . isosorbide mononitrate (IMDUR) 30 MG 24 hr tablet Take 0.5 tablets (15 mg total) by mouth daily.  . metoprolol succinate (TOPROL-XL) 100 MG 24 hr tablet Take 1 tablet (100 mg total) by mouth daily. Take with or immediately following a meal. (Patient taking differently: Take 100 mg by mouth daily. Take with Toprol XL 50 mg to equal 150 mg Daily)  . metoprolol succinate (TOPROL-XL) 50 MG 24 hr tablet Take 1 tablet (50 mg total) by mouth daily. Take with or immediately  following a meal. Take with Toprol XL 100 mg to equal 150 mg Daily (Patient taking differently: Take 50 mg by mouth daily. Take with Toprol XL 100 mg to equal 150 mg Daily)  . potassium chloride SA (KLOR-CON) 20 MEQ tablet Take 1 tablet (20 mEq total) by mouth daily.  . temazepam (RESTORIL) 7.5 MG capsule Take 1 capsule (7.5 mg total) by mouth at bedtime as needed for sleep.  . VENTOLIN HFA 108 (90 Base) MCG/ACT inhaler Inhale 2 puffs into the lungs every 6 (six) hours as needed for wheezing or shortness of breath.   No facility-administered encounter medications on file as of 08/20/2020.    PHYSICAL EXAM:   Deferred Ane Conerly Z Sadie Pickar, NP

## 2020-08-20 NOTE — Progress Notes (Deleted)
Clinical Summary Darius Smith is a 74 y.o.male  seen today for follow up of the following medical problems.   1. Acute on chronic systolic HF - recent admission 06/2020 with volume overload. BNP this admit 2000s, CXR with edema.  - diuresis was limited by elevation in Cr - with elevation in Cr we stopped his entresto, started hydral/imdur. - dischargedo n lasix 40mg  daily.   - no SOB or DOE - home weights 197-201 lbs and stable. - some ongoing mild LE edema  2. Persistent afib - dynamaps do not accuratelty measure his HRs, best measured by EKG>  - issues with elevated HRs recently including during recent admission - difficulty controlling with beta blocker alone, avoiding CCB due to systolic dysfunction, renal function and HF limit antiarrhythmic options. During admission we started amiodarone - failed attempt at [place] 07/16/20, plan to continue oral load and retry in 3 weeks.   - currently on amio 200mg  bid - no palpitations.    3. Stage IV lymphoma - from onc notes just monitoring at this time, in absence of symptoms no indciation for therapy. - I did hear back from oncology, they report his lymphoma is not curable and treatment only helps symptoms. Prognosis can be a few years to a few decades, and they do not see a contraindciation to proceeding with cath in the future. Past Medical History:  Diagnosis Date  . Allergic rhinitis 07/02/2019  . Arthritis    both hands  . Atrial fibrillation ([facility]) 10/28/2017   Dr. Harl Bowie  . Atrial fibrillation with RVR ([facility]) 07/14/2020  . Blood in urine    occ  . Bronchitis   . Cancer (HCC)    Basal cell  . Cardiomegaly 10/28/2017  . Complication of anesthesia    Darius Smith developed a. fib in recovery after cysto procedure 10/2017 was transferred to [facility] and underwent successful cardioversion  . Diverticulosis of sigmoid colon 08/2017   Noted on colonoscopy  . Dysrhythmia   . External hemorrhoids 08/2017  . GERD  (gastroesophageal reflux disease)   . History of colon polyps 08/2017  . History of kidney stones   . History of rectal bleeding   . History of right inguinal hernia   . Hypertension   . Lymphoma ([facility])   . Nephrolithiasis 07/02/2019  . Rectal bleeding 09/02/2017   Added automatically from request for surgery [phone]  . Shortness of breath      Allergies  Allergen Reactions  . Augmentin [Amoxicillin-Pot Clavulanate] Other (See Comments)    Increased liver enzymes Has patient had a PCN reaction causing immediate rash, facial/tongue/throat swelling, SOB or lightheadedness with hypotension: No Has patient had a PCN reaction causing severe rash involving mucus membranes or skin necrosis: No Has patient had a PCN reaction that required hospitalization: No Has patient had a PCN reaction occurring within the last 10 years: No  If all of the above answers are "NO", then may proceed with Cephalosporin use.   Marland Kitchen Flomax [Tamsulosin Hcl] Nausea Only    Dizziness  Pt is on flomax     Current Outpatient Medications  Medication Sig Dispense Refill  . acetaminophen (TYLENOL) 500 MG tablet Take 1,000 mg by mouth every 6 (six) hours as needed for moderate pain or headache.    Marland Kitchen amiodarone (PACERONE) 200 MG tablet Take 1 tablet (200 mg total) by mouth daily. 90 tablet 3  . cetirizine (ZYRTEC) 10 MG tablet Take 1 tablet (10 mg total) by mouth daily as  needed for allergies. 90 tablet 2  . ELIQUIS 5 MG TABS tablet TAKE ONE TABLET BY MOUTH 2 TIMES A DAY. (Patient taking differently: Take 5 mg by mouth 2 (two) times daily. ) 60 tablet 6  . furosemide (LASIX) 40 MG tablet 40mg  in AM, 20mg  at 4 pm. 45 tablet 1  . hydrALAZINE (APRESOLINE) 25 MG tablet Take 1 tablet (25 mg total) by mouth 3 (three) times daily. 90 tablet 1  . isosorbide mononitrate (IMDUR) 30 MG 24 hr tablet Take 0.5 tablets (15 mg total) by mouth daily. 30 tablet 1  . metoprolol succinate (TOPROL-XL) 100 MG 24 hr tablet Take 1 tablet (100 mg  total) by mouth daily. Take with or immediately following a meal. (Patient taking differently: Take 100 mg by mouth daily. Take with Toprol XL 50 mg to equal 150 mg Daily) 30 tablet 2  . metoprolol succinate (TOPROL-XL) 50 MG 24 hr tablet Take 1 tablet (50 mg total) by mouth daily. Take with or immediately following a meal. Take with Toprol XL 100 mg to equal 150 mg Daily (Patient taking differently: Take 50 mg by mouth daily. Take with Toprol XL 100 mg to equal 150 mg Daily) 90 tablet 3  . potassium chloride SA (KLOR-CON) 20 MEQ tablet Take 1 tablet (20 mEq total) by mouth daily. 90 tablet 3  . temazepam (RESTORIL) 7.5 MG capsule Take 1 capsule (7.5 mg total) by mouth at bedtime as needed for sleep. 30 capsule 0  . VENTOLIN HFA 108 (90 Base) MCG/ACT inhaler Inhale 2 puffs into the lungs every 6 (six) hours as needed for wheezing or shortness of breath. 18 g 3   No current facility-administered medications for this visit.     Past Surgical History:  Procedure Laterality Date  . CARDIOVERSION     10/2017  . CARDIOVERSION N/A 07/16/2020   Procedure: CARDIOVERSION;  Surgeon: Arnoldo Lenis, MD;  Location: [facility];  Service: Endoscopy;  Laterality: N/A;  . CHOLECYSTECTOMY    . 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 CLEFT PALATE REPAIR     several from childhood til 74 years old  . COLONOSCOPY  04/28/2011   Procedure: COLONOSCOPY;  Surgeon: Rogene Houston, MD;  Location: [facility];  Service: Endoscopy;  Laterality: N/A;  . COLONOSCOPY N/A 07/18/2014   Procedure: COLONOSCOPY;  Surgeon: Rogene Houston, MD;  Location: [facility];  Service: Endoscopy;  Laterality: N/A;  830  . COLONOSCOPY N/A 09/09/2017   Procedure: COLONOSCOPY;  Surgeon: Rogene Houston, MD;  Location: [facility];  Service: Endoscopy;  Laterality: N/A;  955  . CYSTOSCOPY/URETEROSCOPY/HOLMIUM LASER/STENT PLACEMENT Bilateral 12/06/2017   Procedure:  CYSTOSCOPY/RETROGRADE/URETEROSCOPY/HOLMIUM LASER/STENT EXCHANGE;  Surgeon: Festus Aloe, MD;  Location: [facility];  Service: Urology;  Laterality: Bilateral;  ONLY NEEDS 60 MIN  . HERNIA REPAIR    . POLYPECTOMY  09/09/2017   Procedure: POLYPECTOMY;  Surgeon: Rogene Houston, MD;  Location: [facility];  Service: Endoscopy;;  colon  . vocal cord surgery       Allergies  Allergen Reactions  . Augmentin [Amoxicillin-Pot Clavulanate] Other (See Comments)    Increased liver enzymes Has patient had a PCN reaction causing immediate rash, facial/tongue/throat swelling, SOB or lightheadedness with hypotension: No Has patient had a PCN reaction causing severe rash involving mucus membranes or skin necrosis: No Has patient had a PCN reaction that required hospitalization: No Has patient had a PCN reaction  occurring within the last 10 years: No  If all of the above answers are "NO", then may proceed with Cephalosporin use.   Marland Kitchen Flomax [Tamsulosin Hcl] Nausea Only    Dizziness  Pt is on flomax      Family History  Problem Relation Age of Onset  . Breast cancer Mother   . Prostate cancer Father   . COPD Sister      Social History Darius Smith reports that he has never smoked. He has never used smokeless tobacco. Darius Smith reports previous alcohol use of about 7.0 standard drinks of alcohol per week.   Review of Systems CONSTITUTIONAL: No weight loss, fever, chills, weakness or fatigue.  HEENT: Eyes: No visual loss, blurred vision, double vision or yellow sclerae.No hearing loss, sneezing, congestion, runny nose or sore throat.  SKIN: No rash or itching.  CARDIOVASCULAR:  RESPIRATORY: No shortness of breath, cough or sputum.  GASTROINTESTINAL: No anorexia, nausea, vomiting or diarrhea. No abdominal pain or blood.  GENITOURINARY: No burning on urination, no polyuria NEUROLOGICAL: No headache, dizziness, syncope, paralysis, ataxia, numbness or tingling in the extremities. No change in  bowel or bladder control.  MUSCULOSKELETAL: No muscle, back pain, joint pain or stiffness.  LYMPHATICS: No enlarged nodes. No history of splenectomy.  PSYCHIATRIC: No history of depression or anxiety.  ENDOCRINOLOGIC: No reports of sweating, cold or heat intolerance. No polyuria or polydipsia.  Marland Kitchen   Physical Examination There were no vitals filed for this visit. There were no vitals filed for this visit.  Gen: resting comfortably, no acute distress HEENT: no scleral icterus, pupils equal round and reactive, no palptable cervical adenopathy,  CV Resp: Clear to auscultation bilaterally GI: abdomen is soft, non-tender, non-distended, normal bowel sounds, no hepatosplenomegaly MSK: extremities are warm, no edema.  Skin: warm, no rash Neuro:  no focal deficits Psych: appropriate affect   Diagnostic Studies     Assessment and Plan   1. Chronic systolic HF - possibly tachy mediated given recent issues with afib with RVR - weights stable at home and slowly trending down. Does have some LE edema. Repeat labs, pending renal function may further titrate diuretic at f/u and potentially restart his entresto - repeat echo in next several weeks once afib controlled, if ongoing dysfunction would need to consider cath - increase toprol to 150mg  daily.   2. Afib - remains in afib by EKG today rates 100 - continue amio 200mg  bid, will lower to 200mg  daily on Nov 19. Failed DCCV during recent admission, retry in next few weeks as amio loads - increase toprol to 150mg  daily.   F/u 2 weeks. Needs ekg then if still in afib would plan dccv.      Arnoldo Lenis, M.D., F.A.C.C.

## 2020-08-21 ENCOUNTER — Encounter (HOSPITAL_COMMUNITY)
Admission: RE | Disposition: A | Discharge status: Home / Self Care | Payer: Self-pay | Source: Home / Self Care | Attending: Cardiology

## 2020-08-21 ENCOUNTER — Ambulatory Visit (HOSPITAL_COMMUNITY): Payer: Medicare Other | Admitting: Certified Registered"

## 2020-08-21 ENCOUNTER — Encounter (HOSPITAL_COMMUNITY): Payer: Self-pay | Admitting: Cardiology

## 2020-08-21 ENCOUNTER — Ambulatory Visit (HOSPITAL_COMMUNITY)
Admission: RE | Admit: 2020-08-21 | Discharge: 2020-08-21 | Disposition: A | Discharge status: Home / Self Care | Payer: Medicare Other | Attending: Cardiology | Admitting: Cardiology

## 2020-08-21 DIAGNOSIS — Z881 Allergy status to other antibiotic agents status: Secondary | ICD-10-CM | POA: Diagnosis not present

## 2020-08-21 DIAGNOSIS — Z79899 Other long term (current) drug therapy: Secondary | ICD-10-CM | POA: Diagnosis not present

## 2020-08-21 DIAGNOSIS — Z7901 Long term (current) use of anticoagulants: Secondary | ICD-10-CM | POA: Insufficient documentation

## 2020-08-21 DIAGNOSIS — I130 Hypertensive heart and chronic kidney disease with heart failure and stage 1 through stage 4 chronic kidney disease, or unspecified chronic kidney disease: Secondary | ICD-10-CM | POA: Insufficient documentation

## 2020-08-21 DIAGNOSIS — I5022 Chronic systolic (congestive) heart failure: Secondary | ICD-10-CM | POA: Insufficient documentation

## 2020-08-21 DIAGNOSIS — C8590 Non-Hodgkin lymphoma, unspecified, unspecified site: Secondary | ICD-10-CM | POA: Insufficient documentation

## 2020-08-21 DIAGNOSIS — N1832 Chronic kidney disease, stage 3b: Secondary | ICD-10-CM | POA: Insufficient documentation

## 2020-08-21 DIAGNOSIS — I429 Cardiomyopathy, unspecified: Secondary | ICD-10-CM | POA: Insufficient documentation

## 2020-08-21 DIAGNOSIS — Z888 Allergy status to other drugs, medicaments and biological substances status: Secondary | ICD-10-CM | POA: Insufficient documentation

## 2020-08-21 DIAGNOSIS — I4819 Other persistent atrial fibrillation: Secondary | ICD-10-CM | POA: Diagnosis not present

## 2020-08-21 DIAGNOSIS — Z880 Allergy status to penicillin: Secondary | ICD-10-CM | POA: Diagnosis not present

## 2020-08-21 DIAGNOSIS — N189 Chronic kidney disease, unspecified: Secondary | ICD-10-CM | POA: Diagnosis not present

## 2020-08-21 DIAGNOSIS — I509 Heart failure, unspecified: Secondary | ICD-10-CM | POA: Diagnosis not present

## 2020-08-21 HISTORY — PX: CARDIOVERSION: SHX1299

## 2020-08-21 SURGERY — CARDIOVERSION
Anesthesia: General

## 2020-08-21 MED ORDER — LIDOCAINE 2% (20 MG/ML) 5 ML SYRINGE
INTRAMUSCULAR | Status: DC | PRN
  Administered 2020-08-21: 60 mg via INTRAVENOUS

## 2020-08-21 MED ORDER — HYDRALAZINE HCL 20 MG/ML IJ SOLN
10.0000 mg | Freq: Once | INTRAMUSCULAR | Status: AC
  Administered 2020-08-21: 10 mg via INTRAVENOUS

## 2020-08-21 MED ORDER — PROPOFOL 10 MG/ML IV BOLUS
INTRAVENOUS | Status: AC
  Filled 2020-08-21: qty 20

## 2020-08-21 MED ORDER — LACTATED RINGERS IV SOLN: INTRAVENOUS | Status: DC | PRN

## 2020-08-21 MED ORDER — PHENYLEPHRINE 40 MCG/ML (10ML) SYRINGE FOR IV PUSH (FOR BLOOD PRESSURE SUPPORT)
PREFILLED_SYRINGE | INTRAVENOUS | Status: AC
  Filled 2020-08-21: qty 10

## 2020-08-21 MED ORDER — LIDOCAINE HCL (PF) 2 % IJ SOLN
INTRAMUSCULAR | Status: AC
  Filled 2020-08-21: qty 5

## 2020-08-21 MED ORDER — PROPOFOL 10 MG/ML IV BOLUS
INTRAVENOUS | Status: DC | PRN
  Administered 2020-08-21: 80 mg via INTRAVENOUS

## 2020-08-21 MED ORDER — HYDRALAZINE HCL 20 MG/ML IJ SOLN
INTRAMUSCULAR | Status: AC
  Filled 2020-08-21: qty 1

## 2020-08-21 NOTE — Progress Notes (Signed)
Electrical Cardioversion Procedure Note VANN OKERLUND [id number] 10-28-1945  Procedure: Electrical Cardioversion Indications:  Atrial Fibrillation  Procedure Details Consent: signed and on chart Time Out: Verified patient identification, verified procedure, site/side was marked, verified correct patient position, special equipment/implants available, medications/allergies/relevent history reviewed, required imaging and test results available.  0859  Patient placed on cardiac monitor, pulse oximetry, supplemental oxygen as necessary.  Sedation given:  Per anesthesia, propofol started at 0900 Pacer pads placed anterior and posterior @ 2409 checked by Dr. Domenic Polite  Cardioverted 1  time(s).  Cardioverted at 120 Joules  Evaluation Findings: Post procedure EKG shows: Normal Sinus Rhythm verified and signed by Dr. Domenic Polite Complications: none  Patient did tolerate procedure well.   Lorna Few 08/21/2020, 9:43 AM

## 2020-08-21 NOTE — Anesthesia Postprocedure Evaluation (Signed)
Anesthesia Post Note  Patient: Darius Smith  Procedure(s) Performed: CARDIOVERSION (N/A )  Patient location during evaluation: PACU Anesthesia Type: General Level of consciousness: awake and alert and oriented Pain management: pain level controlled Vital Signs Assessment: post-procedure vital signs reviewed and stable Respiratory status: spontaneous breathing, nonlabored ventilation and respiratory function stable Cardiovascular status: blood pressure returned to baseline and stable Postop Assessment: no apparent nausea or vomiting Anesthetic complications: no   No complications documented.   Last Vitals:  Vitals:   08/21/20 0910 08/21/20 0915  BP: (P) 95/72 (P) 100/71  Pulse: (!) (P) 45 (!) (P) 43  Resp: (P) 16 (!) (P) 24  Temp: (P) 36.7 C   SpO2: (P) 97%     Last Pain:  Vitals:   08/21/20 0815  PainSc: 0-No pain                 Orlie Dakin

## 2020-08-21 NOTE — CV Procedure (Signed)
Elective direct-current cardioversion  Indication: Persistent atrial fibrillation  Description of procedure: After informed consent was obtained, the patient was taken to the PACU where a timeout was performed.  Anterior and posterior pads were placed in standard fashion and connected to a biphasic defibrillator.  Deep sedation was achieved with use of propofol per the anesthesia service.  With sandbag placed on the anterior chest pad, a single synchronized 120 J shock was delivered with successful restoration of sinus bradycardia.  Rhythm was confirmed by ECG.  There were no immediate complications.  Satira Sark, M.D., F.A.C.C.

## 2020-08-21 NOTE — Progress Notes (Signed)
Dr Domenic Polite at bedside to check pt.

## 2020-08-21 NOTE — Interval H&P Note (Signed)
History and Physical Interval Note:  08/21/2020 8:54 AM  Patient of Dr. Harl Bowie presenting for elective cardioversion with persistent atrial fibrillation and cardiomyopathy.  Most recent echocardiogram demonstrates LVEF 20 to 25% range.  He has hypertension and CKD stage IIIb, recent lab work reviewed.  He reports compliance with his medications including Eliquis, Toprol-XL, Imdur, and hydralazine.  Blood pressure elevated in holding bay, he received an additional dose of hydralazine 10 mg IV prior to cardioversion.  Informed consent obtained and he is in agreement to proceed.  Satira Sark, M.D., F.A.C.C.

## 2020-08-21 NOTE — Progress Notes (Signed)
Dr Domenic Polite returned call. Pt BP readings discussed. Order given for apresoline.

## 2020-08-21 NOTE — Transfer of Care (Signed)
Immediate Anesthesia Transfer of Care Note  Patient: Darius Smith  Procedure(s) Performed: CARDIOVERSION (N/A )  Patient Location: PACU  Anesthesia Type:General  Level of Consciousness: drowsy  Airway & Oxygen Therapy: Patient Spontanous Breathing and Patient connected to face mask oxygen  Post-op Assessment: Report given to RN and Post -op Vital signs reviewed and stable  Post vital signs: Reviewed and stable  Last Vitals:  Vitals Value Taken Time  BP    Temp    Pulse    Resp    SpO2      Last Pain:  Vitals:   08/21/20 0815  PainSc: 0-No pain         Complications: No complications documented.

## 2020-08-21 NOTE — Progress Notes (Signed)
BP 132/108. Manual BP right arm 152/108. Dr Briant Cedar notified. Instructed to contact Dr Domenic Polite. Dr Domenic Polite beeped.

## 2020-08-21 NOTE — Anesthesia Preprocedure Evaluation (Signed)
Anesthesia Evaluation  Patient identified by MRN, date of birth, ID band Patient awake    Reviewed: Allergy & Precautions, NPO status , Patient's Chart, lab work & pertinent test results  History of Anesthesia Complications (+) PONV and history of anesthetic complications  Airway Mallampati: II  TM Distance: >3 FB Neck ROM: Full    Dental no notable dental hx.    Pulmonary neg pulmonary ROS, with exertion,    Pulmonary exam normal  + wheezing      Cardiovascular hypertension, +CHF (Related to a fib)  negative cardio ROS Normal cardiovascular exam(-) dysrhythmias Atrial Fibrillation  Rhythm:Regular Rate:Normal  EF 25%   Neuro/Psych negative neurological ROS  negative psych ROS   GI/Hepatic Neg liver ROS, GERD  Medicated,  Endo/Other  negative endocrine ROS  Renal/GU CRFnegative Renal ROS  negative genitourinary   Musculoskeletal negative musculoskeletal ROS (+) Osteoarthritis,    Abdominal   Peds negative pediatric ROS (+)  Hematology negative hematology ROS (+)   Anesthesia Other Findings   Reproductive/Obstetrics negative OB ROS                             Anesthesia Physical  Anesthesia Plan  ASA: IV  Anesthesia Plan: General   Post-op Pain Management:    Induction: Intravenous  PONV Risk Score and Plan:   Airway Management Planned:   Additional Equipment:   Intra-op Plan:   Post-operative Plan: Extubation in OR  Informed Consent: I have reviewed the patients History and Physical, chart, labs and discussed the procedure including the risks, benefits and alternatives for the proposed anesthesia with the patient or authorized representative who has indicated his/her understanding and acceptance.     Dental advisory given  Plan Discussed with: CRNA  Anesthesia Plan Comments:         Anesthesia Quick Evaluation

## 2020-08-21 NOTE — Anesthesia Procedure Notes (Signed)
Procedure Name: General with mask airway Date/Time: 08/21/2020 9:03 AM Performed by: Orlie Dakin, CRNA Pre-anesthesia Checklist: Emergency Drugs available, Patient identified, Suction available and Patient being monitored Patient Re-evaluated:Patient Re-evaluated prior to induction Oxygen Delivery Method: Nasal cannula Induction Type: IV induction Placement Confirmation: positive ETCO2

## 2020-08-21 NOTE — OR Nursing (Signed)
Blood pressure 132/118, HR 106, Sat 97%, A-fib,  Manual pressure 152/108.  Dr. Domenic Polite notified.  Orders given.

## 2020-08-21 NOTE — Discharge Instructions (Signed)
Electrical Cardioversion Electrical cardioversion is the delivery of a jolt of electricity to restore a normal rhythm to the heart. A rhythm that is too fast or is not regular keeps the heart from pumping well. In this procedure, sticky patches or metal paddles are placed on the chest to deliver electricity to the heart from a device. This procedure may be done in an emergency if:  There is low or no blood pressure as a result of the heart rhythm.  Normal rhythm must be restored as fast as possible to protect the brain and heart from further damage.  It may save a life. This may also be a scheduled procedure for irregular or fast heart rhythms that are not immediately life-threatening. Tell a health care provider about:  Any allergies you have.  All medicines you are taking, including vitamins, herbs, eye drops, creams, and over-the-counter medicines.  Any problems you or family members have had with anesthetic medicines.  Any blood disorders you have.  Any surgeries you have had.  Any medical conditions you have.  Whether you are pregnant or may be pregnant. What are the risks? Generally, this is a safe procedure. However, problems may occur, including:  Allergic reactions to medicines.  A blood clot that breaks free and travels to other parts of your body.  The possible return of an abnormal heart rhythm within hours or days after the procedure.  Your heart stopping (cardiac arrest). This is rare. What happens before the procedure? Medicines  Your health care provider may have you start taking: ? Blood-thinning medicines (anticoagulants) so your blood does not clot as easily. ? Medicines to help stabilize your heart rate and rhythm.  Ask your health care provider about: ? Changing or stopping your regular medicines. This is especially important if you are taking diabetes medicines or blood thinners. ? Taking medicines such as aspirin and ibuprofen. These medicines can  thin your blood. Do not take these medicines unless your health care provider tells you to take them. ? Taking over-the-counter medicines, vitamins, herbs, and supplements. General instructions  Follow instructions from your health care provider about eating or drinking restrictions.  Plan to have someone take you home from the hospital or clinic.  If you will be going home right after the procedure, plan to have someone with you for 24 hours.  Ask your health care provider what steps will be taken to help prevent infection. These may include washing your skin with a germ-killing soap. What happens during the procedure?   An IV will be inserted into one of your veins.  Sticky patches (electrodes) or metal paddles may be placed on your chest.  You will be given a medicine to help you relax (sedative).  An electrical shock will be delivered. The procedure may vary among health care providers and hospitals. What can I expect after the procedure?  Your blood pressure, heart rate, breathing rate, and blood oxygen level will be monitored until you leave the hospital or clinic.  Your heart rhythm will be watched to make sure it does not change.  You may have some redness on the skin where the shocks were given. Follow these instructions at home:  Do not drive for 24 hours if you were given a sedative during your procedure.  Take over-the-counter and prescription medicines only as told by your health care provider.  Ask your health care provider how to check your pulse. Check it often.  Rest for 48 hours after the procedure or   as told by your health care provider.  Avoid or limit your caffeine use as told by your health care provider.  Keep all follow-up visits as told by your health care provider. This is important. Contact a health care provider if:  You feel like your heart is beating too quickly or your pulse is not regular.  You have a serious muscle cramp that does not go  away. Get help right away if:  You have discomfort in your chest.  You are dizzy or you feel faint.  You have trouble breathing or you are short of breath.  Your speech is slurred.  You have trouble moving an arm or leg on one side of your body.  Your fingers or toes turn cold or blue. Summary  Electrical cardioversion is the delivery of a jolt of electricity to restore a normal rhythm to the heart.  This procedure may be done right away in an emergency or may be a scheduled procedure if the condition is not an emergency.  Generally, this is a safe procedure.  After the procedure, check your pulse often as told by your health care provider. This information is not intended to replace advice given to you by your health care provider. Make sure you discuss any questions you have with your health care provider. Document Revised: 04/09/2019 Document Reviewed: 04/09/2019 Elsevier Patient Education  2020 Elsevier Inc.  

## 2020-08-25 ENCOUNTER — Encounter: Payer: Self-pay | Admitting: Physician Assistant

## 2020-08-25 ENCOUNTER — Other Ambulatory Visit: Payer: Self-pay

## 2020-08-25 ENCOUNTER — Other Ambulatory Visit (HOSPITAL_COMMUNITY)
Admission: RE | Admit: 2020-08-25 | Discharge: 2020-08-25 | Disposition: A | Discharge status: Home / Self Care | Payer: Medicare Other | Source: Ambulatory Visit | Attending: Physician Assistant | Admitting: Physician Assistant

## 2020-08-25 ENCOUNTER — Telehealth: Payer: Self-pay | Admitting: *Deleted

## 2020-08-25 ENCOUNTER — Ambulatory Visit: Payer: Medicare Other | Admitting: Physician Assistant

## 2020-08-25 VITALS — BP 140/78 | HR 94 | Ht 72.0 in | Wt 178.0 lb

## 2020-08-25 DIAGNOSIS — N1832 Chronic kidney disease, stage 3b: Secondary | ICD-10-CM | POA: Diagnosis not present

## 2020-08-25 DIAGNOSIS — I255 Ischemic cardiomyopathy: Secondary | ICD-10-CM | POA: Insufficient documentation

## 2020-08-25 DIAGNOSIS — I4819 Other persistent atrial fibrillation: Secondary | ICD-10-CM

## 2020-08-25 LAB — BASIC METABOLIC PANEL
Anion gap: 9 (ref 5–15)
BUN: 16 mg/dL (ref 8–23)
CO2: 24 mmol/L (ref 22–32)
Calcium: 8.5 mg/dL — ABNORMAL LOW (ref 8.9–10.3)
Chloride: 102 mmol/L (ref 98–111)
Creatinine, Ser: 1.3 mg/dL — ABNORMAL HIGH (ref 0.61–1.24)
GFR, Estimated: 58 mL/min — ABNORMAL LOW (ref >60)
Glucose, Bld: 117 mg/dL — ABNORMAL HIGH (ref 70–99)
Potassium: 3.6 mmol/L (ref 3.5–5.1)
Sodium: 135 mmol/L (ref 135–145)

## 2020-08-25 MED ORDER — METOPROLOL SUCCINATE ER 100 MG PO TB24: 150.0000 mg | ORAL_TABLET | Freq: Every day | ORAL | 3 refills | Status: DC

## 2020-08-25 NOTE — Progress Notes (Signed)
Cardiology Office Note    Date:  08/25/2020   ID:  Daevion, Navarette [birth year], MRN [id number]  PCP:  Perlie Mayo, NP  Cardiologist: Carlyle Dolly, MD EPS: None  Chief Complaint  Patient presents with  . Follow-up  . Atrial Fibrillation    History of Present Illness:  Darius Smith is a 74 y.o. 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   Dr. Harl Bowie 07/30/2020 at which time Toprol was increased to 150 mg daily and amiodarone was lowered to 200 mg daily with plans to retry cardioversion after amiodarone load.  Also plan to repeat echo once A. fib controlled if ongoing dysfunction consider cardiac catheterization.  Patient was admitted to the hospital the very next day with acute on chronic CHF after skipping the evening Lasix dose. He diuresed 4.3 L and Crt 1.57 at discharge.  I saw the patient 08/12/2020 which time his heart rate was still getting up to 120 with walking.  He had no appetite and was having nausea.  I scheduled him for cardioversion and he underwent successful DCCV 08/21/2020.  I decreased his Lasix to 40 mg daily and 20 mg as needed in the afternoon for weight gain.  Home health has been checking on patient and when he was seen on Friday, 08/22/2020 his heart rate was regular.  Today irregular heart rate 43-57.  Patient's blood pressure monitor sats fluctuating up to 140 bpm.   Patient comes in for f/u. Says he started urinating a lot yesterday. Doesn't feel Afib. HR has been between 40-130 this am. Nausea improved with Prilosec.     Past Medical History:  Diagnosis Date  . Allergic rhinitis 07/02/2019  . Arthritis    both hands  . Atrial fibrillation ([place]) 10/28/2017   Dr. Harl Bowie  . Atrial fibrillation with RVR ([place]) 07/14/2020  . Blood in urine    occ  . Bronchitis   . Cancer ([facility])    Basal cell  .  Cardiomegaly 10/28/2017  . Complication of anesthesia    Mr. Dupuy developed a. fib in recovery after cysto procedure 10/2017 was transferred to [facility] and underwent successful cardioversion  . Diverticulosis of sigmoid colon 08/2017   Noted on colonoscopy  . Dysrhythmia   . External hemorrhoids 08/2017  . GERD (gastroesophageal reflux disease)   . History of colon polyps 08/2017  . History of kidney stones   . History of rectal bleeding   . History of right inguinal hernia   . Hypertension   . Lymphoma ([place])   . Nephrolithiasis 07/02/2019  . Rectal bleeding 09/02/2017   Added automatically from request for surgery [id number]  . Shortness of breath     Past Surgical History:  Procedure Laterality Date  . CARDIOVERSION     10/2017  . CARDIOVERSION N/A 07/16/2020   Procedure: CARDIOVERSION;  Surgeon: Arnoldo Lenis, MD;  [facility];  Service: Endoscopy;  Laterality: N/A;  . CHOLECYSTECTOMY    . 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 CLEFT PALATE REPAIR     several from childhood til 74 years old  . COLONOSCOPY  04/28/2011   Procedure: COLONOSCOPY;  Surgeon: Rogene Houston, MD;  Location: [facility];  Service: Endoscopy;  Laterality: N/A;  . COLONOSCOPY N/A 07/18/2014   Procedure: COLONOSCOPY;  Surgeon: Rogene Houston, MD;  Location: [facility];  Service: Endoscopy;  Laterality: N/A;  830  . COLONOSCOPY N/A 09/09/2017   Procedure: COLONOSCOPY;  Surgeon: Rogene Houston, MD;  Location: [facility];  Service: Endoscopy;  Laterality: N/A;  955  . CYSTOSCOPY/URETEROSCOPY/HOLMIUM LASER/STENT PLACEMENT Bilateral 12/06/2017   Procedure: CYSTOSCOPY/RETROGRADE/URETEROSCOPY/HOLMIUM LASER/STENT EXCHANGE;  Surgeon: Festus Aloe, MD;  Location: [facility];  Service: Urology;  Laterality: Bilateral;  ONLY NEEDS 60 MIN  . HERNIA REPAIR    . POLYPECTOMY  09/09/2017   Procedure: POLYPECTOMY;  Surgeon: Rogene Houston, MD;  [facility]: [facility];   Service: Endoscopy;;  colon  . vocal cord surgery      Current Medications: Current Meds  Medication Sig  . acetaminophen (TYLENOL) 500 MG tablet Take 1,000 mg by mouth every 6 (six) hours as needed for moderate pain or headache.  Marland Kitchen amiodarone (PACERONE) 200 MG tablet Take 1 tablet (200 mg total) by mouth daily.  . cetirizine (ZYRTEC) 10 MG tablet Take 1 tablet (10 mg total) by mouth daily as needed for allergies.  Marland Kitchen ELIQUIS 5 MG TABS tablet TAKE ONE TABLET BY MOUTH 2 TIMES A DAY. (Patient taking differently: Take 5 mg by mouth 2 (two) times daily. )  . furosemide (LASIX) 40 MG tablet 40mg  in AM, 20mg  at 4 pm.  . hydrALAZINE (APRESOLINE) 25 MG tablet Take 1 tablet (25 mg total) by mouth 3 (three) times daily.  . isosorbide mononitrate (IMDUR) 30 MG 24 hr tablet Take 0.5 tablets (15 mg total) by mouth daily.  . potassium chloride SA (KLOR-CON) 20 MEQ tablet Take 1 tablet (20 mEq total) by mouth daily.  . temazepam (RESTORIL) 7.5 MG capsule Take 1 capsule (7.5 mg total) by mouth at bedtime as needed for sleep.  . VENTOLIN HFA 108 (90 Base) MCG/ACT inhaler Inhale 2 puffs into the lungs every 6 (six) hours as needed for wheezing or shortness of breath.  . [DISCONTINUED] metoprolol succinate (TOPROL-XL) 100 MG 24 hr tablet Take 1 tablet (100 mg total) by mouth daily. Take with or immediately following a meal. (Patient taking differently: Take 100 mg by mouth daily. Take with Toprol XL 50 mg to equal 150 mg Daily)     Allergies:   Augmentin [amoxicillin-pot clavulanate] and Flomax [tamsulosin hcl]   Social History   Socioeconomic History  . Marital status: Married    Spouse name: Not on file  . Number of children: Not on file  . Years of education: Not on file  . Highest education level: Not on file  Occupational History  . Occupation: retired  Tobacco Use  . Smoking status: Never Smoker  . Smokeless tobacco: Never Used  Vaping Use  . Vaping Use: Never used  Substance and Sexual  Activity  . Alcohol use: Not Currently    Alcohol/week: 7.0 standard drinks    Types: 7 Cans of beer per week  . Drug use: No  . Sexual activity: Not on file  Other Topics Concern  . Not on file  Social History Narrative   Lives with wife Darius Smith of 16 years April 15, 2020      2 children, 2 grandchildren   Dog: Gardner Candle      Enjoys: being outdoors       Diet: eats all food groups    Caffeine: drink coffee in the morning, some tea, diet dr pepper   Water: 3-4 cups daily  Wears seat belt    Does not use phone while driving   Smoke detectors   Rifle put up not loaded          Social Determinants of Health   Financial Resource Strain: Low Risk   . Difficulty of Paying Living Expenses: Not hard at all  Food Insecurity: No Food Insecurity  . Worried About Charity fundraiser in the Last Year: Never true  . Ran Out of Food in the Last Year: Never true  Transportation Needs: No Transportation Needs  . Lack of Transportation (Medical): No  . Lack of Transportation (Non-Medical): No  Physical Activity: Sufficiently Active  . Days of Exercise per Week: 7 days  . Minutes of Exercise per Session: 60 min  Stress: No Stress Concern Present  . Feeling of Stress : Not at all  Social Connections: Moderately Integrated  . Frequency of Communication with Friends and Family: More than three times a week  . Frequency of Social Gatherings with Friends and Family: Twice a week  . Attends Religious Services: More than 4 times per year  . Active Member of Clubs or Organizations: No  . Attends Archivist Meetings: Never  . Marital Status: Married     Family History:  The patient's family history includes Breast cancer in his mother; COPD in his sister; Prostate cancer in his father.   ROS:   Please see the history of present illness.    ROS All other systems reviewed and are negative.   PHYSICAL EXAM:   VS:  BP 140/78   Pulse 94   Ht 6' (1.829 m)   Wt 178 lb  (80.7 kg)   SpO2 95%   BMI 24.14 kg/m   Physical Exam  GEN: Well nourished, well developed, in no acute distress  Neck: no JVD, carotid bruits, or masses Cardiac: irreg irreg; no murmurs, rubs, or gallops  Respiratory:  clear to auscultation bilaterally, normal work of breathing GI: soft, nontender, nondistended, + BS Ext: without cyanosis, clubbing, or edema, Good distal pulses bilaterally Neuro:  Alert and Oriented x 3 Psych: euthymic mood, full affect  Wt Readings from Last 3 Encounters:  08/25/20 178 lb (80.7 kg)  08/19/20 184 lb (83.5 kg)  08/12/20 186 lb (84.4 kg)      Studies/Labs Reviewed:   EKG:  EKG is  ordered today.  The ekg ordered today demonstrates atrial fibrillation with ST-T wave abnormality inferior lateral more prominent when he is in atrial fibrillation the when he is in normal sinus rhythm.  Recent Labs: 07/31/2020: B Natriuretic Peptide 25.0 08/01/2020: ALT 84; Magnesium 2.0; TSH 1.156 08/12/2020: Hemoglobin 13.3; Platelets 329 08/19/2020: BUN 27; Creatinine, Ser 1.81; Potassium 4.4; Sodium 135   Lipid Panel    Component Value Date/Time   CHOL 111 07/31/2020 1131   TRIG 62 07/31/2020 1131   HDL 38 (L) 07/31/2020 1131   CHOLHDL 2.9 07/31/2020 1131   LDLCALC 59 07/31/2020 1131    Additional studies/ records that were reviewed today include:   Echo 06/12/2020 IMPRESSIONS     1. Left ventricular ejection fraction, by estimation, is 20 to 25%. The  left ventricle has severely decreased function. The left ventricle  demonstrates global hypokinesis. There is mild left ventricular  hypertrophy. Left ventricular diastolic parameters   are indeterminate.   2. Right ventricular systolic function is mildly reduced. The right  ventricular size is moderately enlarged.   3. Left atrial size was severely dilated.   4. Right  atrial size was mildly dilated.   5. The mitral valve is normal in structure. Trivial mitral valve  regurgitation. No evidence of  mitral stenosis.   6. The aortic valve was not well visualized. There is mild calcification  of the aortic valve. There is mild thickening of the aortic valve. Aortic  valve regurgitation is not visualized. No aortic stenosis is present.   7. The inferior vena cava is normal in size with greater than 50%  respiratory variability, suggesting right atrial pressure of 3 mmHg.   FINDINGS   Left Ventricle: Global hypokinesis, the anteroseptal wall is akinetic.  Left ventricular ejection fraction, by estimation, is 20 to 25%. The left  ventricle has severely decreased function. The left ventricle demonstrates  global hypokinesis. Definity  contrast agent was given IV to delineate the left ventricular endocardial  borders. The left ventricular internal cavity size was normal in size.  There is mild left ventricular hypertrophy. Left ventricular diastolic  parameters are indeterminate.   Right Ventricle: The right ventricular size is moderately enlarged. Right  vetricular wall thickness was not assessed. Right ventricular systolic  function is mildly reduced.   Left Atrium: Left atrial size was severely dilated.   Right Atrium: Right atrial size was mildly dilated.   Pericardium: There is no evidence of pericardial effusion.   Mitral Valve: The mitral valve is normal in structure. Trivial mitral  valve regurgitation. No evidence of mitral valve stenosis.   Tricuspid Valve: The tricuspid valve is normal in structure. Tricuspid  valve regurgitation is mild . No evidence of tricuspid stenosis.   Aortic Valve: The aortic valve was not well visualized. There is mild  calcification of the aortic valve. There is mild thickening of the aortic  valve. There is mild aortic valve annular calcification. Aortic valve  regurgitation is not visualized. No  aortic stenosis is present. Aortic valve mean gradient measures 4.2 mmHg.  Aortic valve peak gradient measures 8.1 mmHg. Aortic valve area, by VTI    measures 1.80 cm.   Pulmonic Valve: The pulmonic valve was not well visualized. Pulmonic valve  regurgitation is not visualized. No evidence of pulmonic stenosis.   Aorta: The aortic root is normal in size and structure.   Pulmonary Artery: Indeterminant PASP, inadequate TR jet.   Venous: The inferior vena cava is normal in size with greater than 50%  respiratory variability, suggesting right atrial pressure of 3 mmHg.   IAS/Shunts: The interatrial septum was not well visualized.         ASSESSMENT:    1. Persistent atrial fibrillation ([place])   2. Ischemic cardiomyopathy   3. Stage 3b chronic kidney disease ([place])      PLAN:  In order of problems listed above:  Persistent atrial fibrillation with RVR failed cardioversion 07/16/2020 on amiodarone and Toprol, successful DCCV 08/21/2020.  Patient did well over the weekend but yesterday began urinating frequently and home health noticed his heart rate was irregular this morning.  Heart rate ranging from 40 to 140 bpm.  Patient is asymptomatic with this. Discussed with Dr. Harl Bowie. Increase Toprol 150 mg daily and f/u in Afib clinic Wed 9am  Systolic CHF LVEF 20 to 35% on echo 06/12/2020.  Entresto stopped because of rising creatinine.  I changed his afternoon Lasix 20 mg as needed but continued 40 mg in the morning.  Llost 6 pounds overnight.  Did not take Lasix today.  CKD stage IIIb Crt1.81 recheck today  Medication Adjustments/Labs and Tests Ordered: Current medicines are  reviewed at length with the patient today.  Concerns regarding medicines are outlined above.  Medication changes, Labs and Tests ordered today are listed in the Patient Instructions below. There are no Patient Instructions on file for this visit.   Signed, Ermalinda Barrios, PA-C  08/25/2020 1:54 PM    [facility] [address], [place], [place]  [address] Phone: [phone]; Fax: [phone]

## 2020-08-25 NOTE — Telephone Encounter (Signed)
Will be seen in office today.

## 2020-08-25 NOTE — Telephone Encounter (Signed)
HHN, Andria Frames is with patient. When she saw him on Friday, 08/22/20 his pulse was regular. Today it is irregular, with HR ranging 43-57 bbm. He has no complaints.    I will forward message to Dr.Branch for dispo.

## 2020-08-25 NOTE — Patient Instructions (Signed)
Medication Instructions:  INCREASE Toprol XL to 150 mg daily    *If you need a refill on your cardiac medications before your next appointment, please call your pharmacy*   Lab Work: BMET today If you have labs (blood work) drawn today and your tests are completely normal, you will receive your results only by: Marland Kitchen MyChart Message (if you have MyChart) OR . A paper copy in the mail If you have any lab test that is abnormal or we need to change your treatment, we will call you to review the results.   Testing/Procedures: None today   Follow-Up: At [facility], you and your health needs are our priority.  As part of our continuing mission to provide you with exceptional heart care, we have created designated Provider Care Teams.  These Care Teams include your primary Cardiologist (physician) and Advanced Practice Providers (APPs -  Physician Assistants and Nurse Practitioners) who all work together to provide you with the care you need, when you need it.  We recommend signing up for the patient portal called "MyChart".  Sign up information is provided on this After Visit Summary.  MyChart is used to connect with patients for Virtual Visits (Telemedicine).  Patients are able to view lab/test results, encounter notes, upcoming appointments, etc.  Non-urgent messages can be sent to your provider as well.   To learn more about what you can do with MyChart, go to [email or web address].    Your next appointment:  A-Fib Clinic this Wednesday ( 12/8)  at 9 am       Thank you for choosing [facility] !

## 2020-08-25 NOTE — Telephone Encounter (Signed)
I spoke with [facility], Krist who is in route to see patient.She will call me back with assessment. He was cardioverted 4 days ago. I wil then message Dr.Branch.

## 2020-08-25 NOTE — Telephone Encounter (Signed)
Home health nurse was contacted by wife to report patient's heart is out of rhythm again d/t fluctuating HR 4-140 by BP monitor. Reported patient had been up all night voiding, lost 6 lbs overnight. Reported no symptoms per home health nurse. Per, Elvina Sidle advised wife to call 911 but wife insisted on her calling cardiologist. Advised that message would be sent to [place] triage so that patient could be contacted.

## 2020-08-26 ENCOUNTER — Encounter (HOSPITAL_COMMUNITY): Payer: Self-pay | Admitting: Cardiology

## 2020-08-26 NOTE — H&P (View-Only) (Signed)
Primary Care Physician: Perlie Mayo, NP Primary Cardiologist: Dr Harl Bowie Primary Electrophysiologist: none Referring Physician: Ermalinda Barrios   Darius Smith is a 74 y.o. male with a history of chronic systolic CHF, HTN, stage IV lymphoma, and persistent atrial fibrillation who presents for consultation in the [facility].  The patient was initially diagnosed with atrial fibrillation 2019 in the setting of a urology procedure. He was found to be back in afib on follow up 06/23/20 with a new cardiomyopathy, EF 20-25%. Patient is on Eliquis for a CHADS2VASC score of 3. He was admitted 07/14/20 for afib RVR with acute CHF. DCCV on 07/16/20 was unsuccessful and he was loaded on amiodarone. He underwent repeat DCCV on 08/21/20 which was initially successful but was back in afib on follow up12/6/21. Patient is mostly unaware of his afib with only mild palpitations. He denies any alcohol use. He has snored all his life 2/2 h/o cleft palate, no witnessed apnea or daytime somnolence.   Today, he denies symptoms of palpitations, chest pain, shortness of breath, orthopnea, PND, lower extremity edema, dizziness, presyncope, syncope, daytime somnolence, bleeding, or neurologic sequela. The patient is tolerating medications without difficulties and is otherwise without complaint today.    Atrial Fibrillation Risk Factors:  he does not have symptoms or diagnosis of sleep apnea. he does not have a history of rheumatic fever. he does not have a history of alcohol use. The patient does not have a history of early familial atrial fibrillation or other arrhythmias.  he has a BMI of Body mass index is 24.44 kg/m.Marland Kitchen Filed Weights   08/27/20 0836  Weight: 81.7 kg    Family History  Problem Relation Age of Onset  . Breast cancer Mother   . Prostate cancer Father   . COPD Sister      Atrial Fibrillation Management history:  Previous antiarrhythmic drugs:  amiodarone Previous cardioversions: 07/16/20, 08/21/20 Previous ablations: none CHADS2VASC score: 3 Anticoagulation history: Eliquis   Past Medical History:  Diagnosis Date  . Allergic rhinitis 07/02/2019  . Arthritis    both hands  . Atrial fibrillation ([place]) 10/28/2017   Dr. Harl Bowie  . Atrial fibrillation with RVR ([place]) 07/14/2020  . Blood in urine    occ  . Bronchitis   . Cancer (HCC)    Basal cell  . Cardiomegaly 10/28/2017  . Complication of anesthesia    Mr. Hsiung developed a. fib in recovery after cysto procedure 10/2017 was transferred to [facility] and underwent successful cardioversion  . Diverticulosis of sigmoid colon 08/2017   Noted on colonoscopy  . Dysrhythmia   . External hemorrhoids 08/2017  . GERD (gastroesophageal reflux disease)   . History of colon polyps 08/2017  . History of kidney stones   . History of rectal bleeding   . History of right inguinal hernia   . Hypertension   . Lymphoma ([place])   . Nephrolithiasis 07/02/2019  . Rectal bleeding 09/02/2017   Added automatically from request for surgery [phone]  . Shortness of breath    Past Surgical History:  Procedure Laterality Date  . CARDIOVERSION     10/2017  . CARDIOVERSION N/A 07/16/2020   Procedure: CARDIOVERSION;  Surgeon: Arnoldo Lenis, MD;  [facility];  Service: Endoscopy;  Laterality: N/A;  . CARDIOVERSION N/A 08/21/2020   Procedure: CARDIOVERSION;  Surgeon: Satira Sark, MD;  Location: [facility];  Service: Cardiovascular;  Laterality: N/A;  . CHOLECYSTECTOMY    . CLEFT LIP REPAIR  several from childhood til 74 years old  . CLEFT PALATE REPAIR     several from childhood til 74 years old  . COLONOSCOPY  04/28/2011   Procedure: COLONOSCOPY;  Surgeon: Rogene Houston, MD;  [facility];  Service: Endoscopy;  Laterality: N/A;  . COLONOSCOPY N/A 07/18/2014   Procedure: COLONOSCOPY;  Surgeon: Rogene Houston, MD;  [facility];  Service: Endoscopy;   Laterality: N/A;  830  . COLONOSCOPY N/A 09/09/2017   Procedure: COLONOSCOPY;  Surgeon: Rogene Houston, MD;  [facility];  Service: Endoscopy;  Laterality: N/A;  955  . CYSTOSCOPY/URETEROSCOPY/HOLMIUM LASER/STENT PLACEMENT Bilateral 12/06/2017   Procedure: CYSTOSCOPY/RETROGRADE/URETEROSCOPY/HOLMIUM LASER/STENT EXCHANGE;  Surgeon: Festus Aloe, MD;  Location: [facility];  Service: Urology;  Laterality: Bilateral;  ONLY NEEDS 60 MIN  . HERNIA REPAIR    . POLYPECTOMY  09/09/2017   Procedure: POLYPECTOMY;  Surgeon: Rogene Houston, MD;  [facility];  Service: Endoscopy;;  colon  . vocal cord surgery      Current Outpatient Medications  Medication Sig Dispense Refill  . acetaminophen (TYLENOL) 500 MG tablet Take 1,000 mg by mouth every 6 (six) hours as needed for moderate pain or headache.    Marland Kitchen amiodarone (PACERONE) 200 MG tablet Take 1 tablet by mouth twice a day for 3 weeks then reduce back to 1 tablet a day 60 tablet 0  . cetirizine (ZYRTEC) 10 MG tablet Take 1 tablet (10 mg total) by mouth daily as needed for allergies. 90 tablet 2  . ELIQUIS 5 MG TABS tablet TAKE ONE TABLET BY MOUTH 2 TIMES A DAY. 60 tablet 6  . furosemide (LASIX) 40 MG tablet 40mg  in AM, 20mg  at 4 pm. 45 tablet 1  . hydrALAZINE (APRESOLINE) 25 MG tablet Take 1 tablet (25 mg total) by mouth 3 (three) times daily. 90 tablet 1  . isosorbide mononitrate (IMDUR) 30 MG 24 hr tablet Take 0.5 tablets (15 mg total) by mouth daily. 30 tablet 1  . metoprolol succinate (TOPROL-XL) 100 MG 24 hr tablet Take 1.5 tablets (150 mg total) by mouth daily. Take with or immediately following a meal. 135 tablet 3  . omeprazole (PRILOSEC) 20 MG capsule     . potassium chloride SA (KLOR-CON) 20 MEQ tablet Take 1 tablet (20 mEq total) by mouth daily. 90 tablet 3  . temazepam (RESTORIL) 7.5 MG capsule Take 1 capsule (7.5 mg total) by mouth at bedtime as needed for sleep. 30 capsule 0  . VENTOLIN HFA 108 (90 Base) MCG/ACT  inhaler Inhale 2 puffs into the lungs every 6 (six) hours as needed for wheezing or shortness of breath. 18 g 3   No current facility-administered medications for this encounter.    Allergies  Allergen Reactions  . Augmentin [Amoxicillin-Pot Clavulanate] Other (See Comments)    Increased liver enzymes Has patient had a PCN reaction causing immediate rash, facial/tongue/throat swelling, SOB or lightheadedness with hypotension: No Has patient had a PCN reaction causing severe rash involving mucus membranes or skin necrosis: No Has patient had a PCN reaction that required hospitalization: No Has patient had a PCN reaction occurring within the last 10 years: No  If all of the above answers are "NO", then may proceed with Cephalosporin use.   Marland Kitchen Flomax [Tamsulosin Hcl] Nausea Only    Dizziness  Pt is on flomax    Social History   Socioeconomic History  . Marital status: Married    Spouse name: Not on file  .  Number of children: Not on file  . Years of education: Not on file  . Highest education level: Not on file  Occupational History  . Occupation: retired  Tobacco Use  . Smoking status: Never Smoker  . Smokeless tobacco: Never Used  Vaping Use  . Vaping Use: Never used  Substance and Sexual Activity  . Alcohol use: Not Currently    Alcohol/week: 7.0 standard drinks    Types: 7 Cans of beer per week  . Drug use: No  . Sexual activity: Not on file  Other Topics Concern  . Not on file  Social History Narrative   Lives with wife [name] of 32 years April 15, 2020      2 children, 2 grandchildren   Dog: Gardner Candle      Enjoys: being outdoors       Diet: eats all food groups    Caffeine: drink coffee in the morning, some tea, diet dr pepper   Water: 3-4 cups daily         Wears seat belt    Does not use phone while driving   [place] put up not loaded          Social Determinants of Radio broadcast assistant Strain: Low Risk   . Difficulty of  Paying Living Expenses: Not hard at all  Food Insecurity: No Food Insecurity  . Worried About Charity fundraiser in the Last Year: Never true  . Ran Out of Food in the Last Year: Never true  Transportation Needs: No Transportation Needs  . Lack of Transportation (Medical): No  . Lack of Transportation (Non-Medical): No  Physical Activity: Sufficiently Active  . Days of Exercise per Week: 7 days  . Minutes of Exercise per Session: 60 min  Stress: No Stress Concern Present  . Feeling of Stress : Not at all  Social Connections: Moderately Integrated  . Frequency of Communication with Friends and Family: More than three times a week  . Frequency of Social Gatherings with Friends and Family: Twice a week  . Attends Religious Services: More than 4 times per year  . Active Member of Clubs or Organizations: No  . Attends Archivist Meetings: Never  . Marital Status: Married  Human resources officer Violence: Not At Risk  . Fear of Current or Ex-Partner: No  . Emotionally Abused: No  . Physically Abused: No  . Sexually Abused: No     ROS- All systems are reviewed and negative except as per the HPI above.  Physical Exam: Vitals:   08/27/20 0836  BP: 140/90  Pulse: 92  Weight: 81.7 kg  Height: 6' (1.829 m)    GEN- The patient is well appearing elderly male, alert and oriented x 3 today.   Head- normocephalic, atraumatic Eyes-  Sclera clear, conjunctiva pink Ears- hearing intact Oropharynx- clear Neck- supple  Lungs- Clear to ausculation bilaterally, normal work of breathing Heart- irregular rate and rhythm, no murmurs, rubs or gallops  GI- soft, NT, ND, + BS Extremities- no clubbing, cyanosis, or edema MS- no significant deformity or atrophy Skin- no rash or lesion Psych- euthymic mood, full affect Neuro- strength and sensation are intact  Wt Readings from Last 3 Encounters:  08/27/20 81.7 kg  08/25/20 80.7 kg  08/19/20 83.5 kg    EKG today demonstrates afib HR 92,  LVH, QRS 110, QTc 487  Echo 06/12/20 demonstrated  1. Left ventricular ejection fraction, by estimation, is  20 to 25%. The  left ventricle has severely decreased function. The left ventricle  demonstrates global hypokinesis. There is mild left ventricular  hypertrophy. Left ventricular diastolic parameters  are indeterminate.  2. Right ventricular systolic function is mildly reduced. The right  ventricular size is moderately enlarged.  3. Left atrial size was severely dilated.  4. Right atrial size was mildly dilated.  5. The mitral valve is normal in structure. Trivial mitral valve  regurgitation. No evidence of mitral stenosis.  6. The aortic valve was not well visualized. There is mild calcification  of the aortic valve. There is mild thickening of the aortic valve. Aortic  valve regurgitation is not visualized. No aortic stenosis is present.  7. The inferior vena cava is normal in size with greater than 50%  respiratory variability, suggesting right atrial pressure of 3 mmHg.   Epic records are reviewed at length today  CHA2DS2-VASc Score = 3  The patient's score is based upon: CHF History: 1 HTN History: 1 Diabetes History: 0 Stroke History: 0 Vascular Disease History: 0 Age Score: 1 Gender Score: 0      ASSESSMENT AND PLAN: 1. Persistent Atrial Fibrillation (ICD10:  I48.19) The patient's CHA2DS2-VASc score is 3, indicating a 3.2% annual risk of stroke.   S/p DCCV on 08/21/20 with ERAF. Severely dilated LA. We discussed therapeutic options today.  Will plan to increase amiodarone to 200 mg BID x 2 weeks and repeat DCCV. Ultimately, rate control may be his best option. ? If he would be candidate for AV node ablation with h/o stage IV lymphoma.  Continue Toprol 150 mg daily Continue Eliquis 5 mg BID Check CBC today, recent bmet reviewed.   2. Secondary Hypercoagulable State (ICD10:  D68.69) The patient is at significant risk for stroke/thromboembolism based upon  his CHA2DS2-VASc Score of 3.  Continue Apixaban (Eliquis).   3. Chronic systolic CHF EF 24-81% No signs or symptoms of fluid overload today.  4. Stage IV lymphoma Not treating per oncology as he is asymptomatic. Per Dr Harl Bowie notes, prognosis is a few years to decades.    Follow up in the AF clinic one week post DCCV.    [facility] [address] [place], [place] [address] [phone] 08/27/2020 9:08 AM

## 2020-08-26 NOTE — Progress Notes (Signed)
Primary Care Physician: Perlie Mayo, NP Primary Cardiologist: Dr Harl Bowie Primary Electrophysiologist: none Referring Physician: Ermalinda Barrios   Darius Smith is a 74 y.o. male with a history of chronic systolic CHF, HTN, stage IV lymphoma, and persistent atrial fibrillation who presents for consultation in the [facility].  The patient was initially diagnosed with atrial fibrillation 2019 in the setting of a urology procedure. He was found to be back in afib on follow up 06/23/20 with a new cardiomyopathy, EF 20-25%. Patient is on Eliquis for a CHADS2VASC score of 3. He was admitted 07/14/20 for afib RVR with acute CHF. DCCV on 07/16/20 was unsuccessful and he was loaded on amiodarone. He underwent repeat DCCV on 08/21/20 which was initially successful but was back in afib on follow up12/6/21. Patient is mostly unaware of his afib with only mild palpitations. He denies any alcohol use. He has snored all his life 2/2 h/o cleft palate, no witnessed apnea or daytime somnolence.   Today, he denies symptoms of palpitations, chest pain, shortness of breath, orthopnea, PND, lower extremity edema, dizziness, presyncope, syncope, daytime somnolence, bleeding, or neurologic sequela. The patient is tolerating medications without difficulties and is otherwise without complaint today.    Atrial Fibrillation Risk Factors:  he does not have symptoms or diagnosis of sleep apnea. he does not have a history of rheumatic fever. he does not have a history of alcohol use. The patient does not have a history of early familial atrial fibrillation or other arrhythmias.  he has a BMI of Body mass index is 24.44 kg/m.Marland Kitchen Filed Weights   08/27/20 0836  Weight: 81.7 kg    Family History  Problem Relation Age of Onset  . Breast cancer Mother   . Prostate cancer Father   . COPD Sister      Atrial Fibrillation Management history:  Previous antiarrhythmic drugs:  amiodarone Previous cardioversions: 07/16/20, 08/21/20 Previous ablations: none CHADS2VASC score: 3 Anticoagulation history: Eliquis   Past Medical History:  Diagnosis Date  . Allergic rhinitis 07/02/2019  . Arthritis    both hands  . Atrial fibrillation ([place]) 10/28/2017   Dr. Harl Bowie  . Atrial fibrillation with RVR ([place]) 07/14/2020  . Blood in urine    occ  . Bronchitis   . Cancer (HCC)    Basal cell  . Cardiomegaly 10/28/2017  . Complication of anesthesia    Mr. Gillie developed a. fib in recovery after cysto procedure 10/2017 was transferred to [facility] and underwent successful cardioversion  . Diverticulosis of sigmoid colon 08/2017   Noted on colonoscopy  . Dysrhythmia   . External hemorrhoids 08/2017  . GERD (gastroesophageal reflux disease)   . History of colon polyps 08/2017  . History of kidney stones   . History of rectal bleeding   . History of right inguinal hernia   . Hypertension   . Lymphoma ([place])   . Nephrolithiasis 07/02/2019  . Rectal bleeding 09/02/2017   Added automatically from request for surgery [phone]  . Shortness of breath    Past Surgical History:  Procedure Laterality Date  . CARDIOVERSION     10/2017  . CARDIOVERSION N/A 07/16/2020   Procedure: CARDIOVERSION;  Surgeon: Arnoldo Lenis, MD;  [facility];  Service: Endoscopy;  Laterality: N/A;  . CARDIOVERSION N/A 08/21/2020   Procedure: CARDIOVERSION;  Surgeon: Satira Sark, MD;  Location: [facility];  Service: Cardiovascular;  Laterality: N/A;  . CHOLECYSTECTOMY    . CLEFT LIP REPAIR  several from childhood til 74 years old  . CLEFT PALATE REPAIR     several from childhood til 74 years old  . COLONOSCOPY  04/28/2011   Procedure: COLONOSCOPY;  Surgeon: Rogene Houston, MD;  [facility];  Service: Endoscopy;  Laterality: N/A;  . COLONOSCOPY N/A 07/18/2014   Procedure: COLONOSCOPY;  Surgeon: Rogene Houston, MD;  [facility];  Service: Endoscopy;   Laterality: N/A;  830  . COLONOSCOPY N/A 09/09/2017   Procedure: COLONOSCOPY;  Surgeon: Rogene Houston, MD;  [facility];  Service: Endoscopy;  Laterality: N/A;  955  . CYSTOSCOPY/URETEROSCOPY/HOLMIUM LASER/STENT PLACEMENT Bilateral 12/06/2017   Procedure: CYSTOSCOPY/RETROGRADE/URETEROSCOPY/HOLMIUM LASER/STENT EXCHANGE;  Surgeon: Festus Aloe, MD;  [facility];  Service: Urology;  Laterality: Bilateral;  ONLY NEEDS 60 MIN  . HERNIA REPAIR    . POLYPECTOMY  09/09/2017   Procedure: POLYPECTOMY;  Surgeon: Rogene Houston, MD;  [facility];  Service: Endoscopy;;  colon  . vocal cord surgery      Current Outpatient Medications  Medication Sig Dispense Refill  . acetaminophen (TYLENOL) 500 MG tablet Take 1,000 mg by mouth every 6 (six) hours as needed for moderate pain or headache.    Marland Kitchen amiodarone (PACERONE) 200 MG tablet Take 1 tablet by mouth twice a day for 3 weeks then reduce back to 1 tablet a day 60 tablet 0  . cetirizine (ZYRTEC) 10 MG tablet Take 1 tablet (10 mg total) by mouth daily as needed for allergies. 90 tablet 2  . ELIQUIS 5 MG TABS tablet TAKE ONE TABLET BY MOUTH 2 TIMES A DAY. 60 tablet 6  . furosemide (LASIX) 40 MG tablet 40mg  in AM, 20mg  at 4 pm. 45 tablet 1  . hydrALAZINE (APRESOLINE) 25 MG tablet Take 1 tablet (25 mg total) by mouth 3 (three) times daily. 90 tablet 1  . isosorbide mononitrate (IMDUR) 30 MG 24 hr tablet Take 0.5 tablets (15 mg total) by mouth daily. 30 tablet 1  . metoprolol succinate (TOPROL-XL) 100 MG 24 hr tablet Take 1.5 tablets (150 mg total) by mouth daily. Take with or immediately following a meal. 135 tablet 3  . omeprazole (PRILOSEC) 20 MG capsule     . potassium chloride SA (KLOR-CON) 20 MEQ tablet Take 1 tablet (20 mEq total) by mouth daily. 90 tablet 3  . temazepam (RESTORIL) 7.5 MG capsule Take 1 capsule (7.5 mg total) by mouth at bedtime as needed for sleep. 30 capsule 0  . VENTOLIN HFA 108 (90 Base) MCG/ACT  inhaler Inhale 2 puffs into the lungs every 6 (six) hours as needed for wheezing or shortness of breath. 18 g 3   No current facility-administered medications for this encounter.    Allergies  Allergen Reactions  . Augmentin [Amoxicillin-Pot Clavulanate] Other (See Comments)    Increased liver enzymes Has patient had a PCN reaction causing immediate rash, facial/tongue/throat swelling, SOB or lightheadedness with hypotension: No Has patient had a PCN reaction causing severe rash involving mucus membranes or skin necrosis: No Has patient had a PCN reaction that required hospitalization: No Has patient had a PCN reaction occurring within the last 10 years: No  If all of the above answers are "NO", then may proceed with Cephalosporin use.   Marland Kitchen Flomax [Tamsulosin Hcl] Nausea Only    Dizziness  Pt is on flomax    Social History   Socioeconomic History  . Marital status: Married    Spouse name: Not on file  .  Number of children: Not on file  . Years of education: Not on file  . Highest education level: Not on file  Occupational History  . Occupation: retired  Tobacco Use  . Smoking status: Never Smoker  . Smokeless tobacco: Never Used  Vaping Use  . Vaping Use: Never used  Substance and Sexual Activity  . Alcohol use: Not Currently    Alcohol/week: 7.0 standard drinks    Types: 7 Cans of beer per week  . Drug use: No  . Sexual activity: Not on file  Other Topics Concern  . Not on file  Social History Narrative   Lives with wife [name] of 8 years April 15, 2020      2 children, 2 grandchildren   Dog: Gardner Candle      Enjoys: being outdoors       Diet: eats all food groups    Caffeine: drink coffee in the morning, some tea, diet dr pepper   Water: 3-4 cups daily         Wears seat belt    Does not use phone while driving   [place] put up not loaded          Social Determinants of Radio broadcast assistant Strain: Low Risk   . Difficulty of  Paying Living Expenses: Not hard at all  Food Insecurity: No Food Insecurity  . Worried About Charity fundraiser in the Last Year: Never true  . Ran Out of Food in the Last Year: Never true  Transportation Needs: No Transportation Needs  . Lack of Transportation (Medical): No  . Lack of Transportation (Non-Medical): No  Physical Activity: Sufficiently Active  . Days of Exercise per Week: 7 days  . Minutes of Exercise per Session: 60 min  Stress: No Stress Concern Present  . Feeling of Stress : Not at all  Social Connections: Moderately Integrated  . Frequency of Communication with Friends and Family: More than three times a week  . Frequency of Social Gatherings with Friends and Family: Twice a week  . Attends Religious Services: More than 4 times per year  . Active Member of Clubs or Organizations: No  . Attends Archivist Meetings: Never  . Marital Status: Married  Human resources officer Violence: Not At Risk  . Fear of Current or Ex-Partner: No  . Emotionally Abused: No  . Physically Abused: No  . Sexually Abused: No     ROS- All systems are reviewed and negative except as per the HPI above.  Physical Exam: Vitals:   08/27/20 0836  BP: 140/90  Pulse: 92  Weight: 81.7 kg  Height: 6' (1.829 m)    GEN- The patient is well appearing elderly male, alert and oriented x 3 today.   Head- normocephalic, atraumatic Eyes-  Sclera clear, conjunctiva pink Ears- hearing intact Oropharynx- clear Neck- supple  Lungs- Clear to ausculation bilaterally, normal work of breathing Heart- irregular rate and rhythm, no murmurs, rubs or gallops  GI- soft, NT, ND, + BS Extremities- no clubbing, cyanosis, or edema MS- no significant deformity or atrophy Skin- no rash or lesion Psych- euthymic mood, full affect Neuro- strength and sensation are intact  Wt Readings from Last 3 Encounters:  08/27/20 81.7 kg  08/25/20 80.7 kg  08/19/20 83.5 kg    EKG today demonstrates afib HR 92,  LVH, QRS 110, QTc 487  Echo 06/12/20 demonstrated  1. Left ventricular ejection fraction, by estimation, is  20 to 25%. The  left ventricle has severely decreased function. The left ventricle  demonstrates global hypokinesis. There is mild left ventricular  hypertrophy. Left ventricular diastolic parameters  are indeterminate.  2. Right ventricular systolic function is mildly reduced. The right  ventricular size is moderately enlarged.  3. Left atrial size was severely dilated.  4. Right atrial size was mildly dilated.  5. The mitral valve is normal in structure. Trivial mitral valve  regurgitation. No evidence of mitral stenosis.  6. The aortic valve was not well visualized. There is mild calcification  of the aortic valve. There is mild thickening of the aortic valve. Aortic  valve regurgitation is not visualized. No aortic stenosis is present.  7. The inferior vena cava is normal in size with greater than 50%  respiratory variability, suggesting right atrial pressure of 3 mmHg.   Epic records are reviewed at length today  CHA2DS2-VASc Score = 3  The patient's score is based upon: CHF History: 1 HTN History: 1 Diabetes History: 0 Stroke History: 0 Vascular Disease History: 0 Age Score: 1 Gender Score: 0      ASSESSMENT AND PLAN: 1. Persistent Atrial Fibrillation (ICD10:  I48.19) The patient's CHA2DS2-VASc score is 3, indicating a 3.2% annual risk of stroke.   S/p DCCV on 08/21/20 with ERAF. Severely dilated LA. We discussed therapeutic options today.  Will plan to increase amiodarone to 200 mg BID x 2 weeks and repeat DCCV. Ultimately, rate control may be his best option. ? If he would be candidate for AV node ablation with h/o stage IV lymphoma.  Continue Toprol 150 mg daily Continue Eliquis 5 mg BID Check CBC today, recent bmet reviewed.   2. Secondary Hypercoagulable State (ICD10:  D68.69) The patient is at significant risk for stroke/thromboembolism based upon  his CHA2DS2-VASc Score of 3.  Continue Apixaban (Eliquis).   3. Chronic systolic CHF EF 64-40% No signs or symptoms of fluid overload today.  4. Stage IV lymphoma Not treating per oncology as he is asymptomatic. Per Dr Harl Bowie notes, prognosis is a few years to decades.    Follow up in the AF clinic one week post DCCV.    [facility] [address] [place], [place] [address] [phone] 08/27/2020 9:08 AM

## 2020-08-27 ENCOUNTER — Encounter (HOSPITAL_COMMUNITY): Payer: Self-pay | Admitting: Physician Assistant

## 2020-08-27 ENCOUNTER — Ambulatory Visit (HOSPITAL_COMMUNITY)
Admission: RE | Admit: 2020-08-27 | Discharge: 2020-08-27 | Disposition: A | Discharge status: Home / Self Care | Payer: Medicare Other | Source: Ambulatory Visit | Attending: Physician Assistant | Admitting: Physician Assistant

## 2020-08-27 ENCOUNTER — Other Ambulatory Visit: Payer: Self-pay

## 2020-08-27 VITALS — BP 140/90 | HR 92 | Ht 72.0 in | Wt 180.2 lb

## 2020-08-27 DIAGNOSIS — C8590 Non-Hodgkin lymphoma, unspecified, unspecified site: Secondary | ICD-10-CM | POA: Diagnosis not present

## 2020-08-27 DIAGNOSIS — I5022 Chronic systolic (congestive) heart failure: Secondary | ICD-10-CM | POA: Insufficient documentation

## 2020-08-27 DIAGNOSIS — Z7901 Long term (current) use of anticoagulants: Secondary | ICD-10-CM | POA: Diagnosis not present

## 2020-08-27 DIAGNOSIS — I4819 Other persistent atrial fibrillation: Secondary | ICD-10-CM | POA: Diagnosis not present

## 2020-08-27 DIAGNOSIS — D6869 Other thrombophilia: Secondary | ICD-10-CM | POA: Insufficient documentation

## 2020-08-27 DIAGNOSIS — Z79899 Other long term (current) drug therapy: Secondary | ICD-10-CM | POA: Insufficient documentation

## 2020-08-27 LAB — CBC
HCT: 46.3 % (ref 39.0–52.0)
Hemoglobin: 14.5 g/dL (ref 13.0–17.0)
MCH: 28 pg (ref 26.0–34.0)
MCHC: 31.3 g/dL (ref 30.0–36.0)
MCV: 89.6 fL (ref 80.0–100.0)
Platelets: 254 10*3/uL (ref 150–400)
RBC: 5.17 MIL/uL (ref 4.22–5.81)
RDW: 16.2 % — ABNORMAL HIGH (ref 11.5–15.5)
WBC: 6.3 10*3/uL (ref 4.0–10.5)
nRBC: 0 % (ref 0.0–0.2)

## 2020-08-27 MED ORDER — AMIODARONE HCL 200 MG PO TABS
ORAL_TABLET | ORAL | 0 refills | Status: DC
Start: 2020-08-27 — End: 2020-09-25

## 2020-08-27 NOTE — Patient Instructions (Addendum)
Increase amiodarone to 200mg  twice a day until follow up   Cardioversion scheduled for Wednesday, December 22nd  - Arrive at the [facility] and go to admitting at 830AM  - Do not eat or drink anything after midnight the night prior to your procedure.  - Take all your morning medication (except diabetic medications) with a sip of water prior to arrival.  - You will not be able to drive home after your procedure.  - Do NOT miss any doses of your blood thinner - if you should miss a dose please notify our office immediately.  - If you feel as if you go back into normal rhythm prior to scheduled cardioversion, please notify our office immediately. If your procedure is canceled in the cardioversion suite you will be charged a cancellation fee.

## 2020-08-29 ENCOUNTER — Ambulatory Visit: Payer: Medicare Other | Admitting: Cardiology

## 2020-09-03 DIAGNOSIS — J9601 Acute respiratory failure with hypoxia: Secondary | ICD-10-CM | POA: Diagnosis not present

## 2020-09-03 DIAGNOSIS — R7401 Elevation of levels of liver transaminase levels: Secondary | ICD-10-CM | POA: Diagnosis not present

## 2020-09-03 DIAGNOSIS — N179 Acute kidney failure, unspecified: Secondary | ICD-10-CM | POA: Diagnosis not present

## 2020-09-04 DIAGNOSIS — N179 Acute kidney failure, unspecified: Secondary | ICD-10-CM | POA: Diagnosis not present

## 2020-09-04 DIAGNOSIS — R7401 Elevation of levels of liver transaminase levels: Secondary | ICD-10-CM | POA: Diagnosis not present

## 2020-09-04 DIAGNOSIS — J9601 Acute respiratory failure with hypoxia: Secondary | ICD-10-CM | POA: Diagnosis not present

## 2020-09-05 DIAGNOSIS — E46 Unspecified protein-calorie malnutrition: Secondary | ICD-10-CM | POA: Diagnosis not present

## 2020-09-05 DIAGNOSIS — I272 Pulmonary hypertension, unspecified: Secondary | ICD-10-CM | POA: Diagnosis not present

## 2020-09-05 DIAGNOSIS — I5032 Chronic diastolic (congestive) heart failure: Secondary | ICD-10-CM | POA: Diagnosis not present

## 2020-09-05 DIAGNOSIS — N179 Acute kidney failure, unspecified: Secondary | ICD-10-CM | POA: Diagnosis not present

## 2020-09-05 DIAGNOSIS — C7951 Secondary malignant neoplasm of bone: Secondary | ICD-10-CM | POA: Diagnosis not present

## 2020-09-05 DIAGNOSIS — I130 Hypertensive heart and chronic kidney disease with heart failure and stage 1 through stage 4 chronic kidney disease, or unspecified chronic kidney disease: Secondary | ICD-10-CM | POA: Diagnosis not present

## 2020-09-05 DIAGNOSIS — R55 Syncope and collapse: Secondary | ICD-10-CM | POA: Diagnosis not present

## 2020-09-05 DIAGNOSIS — K5730 Diverticulosis of large intestine without perforation or abscess without bleeding: Secondary | ICD-10-CM | POA: Diagnosis not present

## 2020-09-05 DIAGNOSIS — I2510 Atherosclerotic heart disease of native coronary artery without angina pectoris: Secondary | ICD-10-CM | POA: Diagnosis not present

## 2020-09-05 DIAGNOSIS — M19041 Primary osteoarthritis, right hand: Secondary | ICD-10-CM | POA: Diagnosis not present

## 2020-09-05 DIAGNOSIS — C3490 Malignant neoplasm of unspecified part of unspecified bronchus or lung: Secondary | ICD-10-CM | POA: Diagnosis not present

## 2020-09-05 DIAGNOSIS — M19042 Primary osteoarthritis, left hand: Secondary | ICD-10-CM | POA: Diagnosis not present

## 2020-09-05 DIAGNOSIS — I4891 Unspecified atrial fibrillation: Secondary | ICD-10-CM | POA: Diagnosis not present

## 2020-09-05 DIAGNOSIS — J9601 Acute respiratory failure with hypoxia: Secondary | ICD-10-CM | POA: Diagnosis not present

## 2020-09-05 DIAGNOSIS — C8290 Follicular lymphoma, unspecified, unspecified site: Secondary | ICD-10-CM | POA: Diagnosis not present

## 2020-09-05 DIAGNOSIS — N1832 Chronic kidney disease, stage 3b: Secondary | ICD-10-CM | POA: Diagnosis not present

## 2020-09-08 ENCOUNTER — Other Ambulatory Visit (HOSPITAL_COMMUNITY)
Admission: RE | Admit: 2020-09-08 | Discharge: 2020-09-08 | Disposition: A | Discharge status: Home / Self Care | Payer: Medicare Other | Source: Ambulatory Visit | Attending: Cardiovascular Disease | Admitting: Cardiovascular Disease

## 2020-09-08 DIAGNOSIS — Z01812 Encounter for preprocedural laboratory examination: Secondary | ICD-10-CM | POA: Diagnosis not present

## 2020-09-08 DIAGNOSIS — Z20822 Contact with and (suspected) exposure to covid-19: Secondary | ICD-10-CM | POA: Insufficient documentation

## 2020-09-08 LAB — SARS CORONAVIRUS 2 (TAT 6-24 HRS): SARS Coronavirus 2: NEGATIVE

## 2020-09-09 ENCOUNTER — Other Ambulatory Visit: Payer: Self-pay

## 2020-09-09 ENCOUNTER — Ambulatory Visit (HOSPITAL_COMMUNITY)
Admission: RE | Admit: 2020-09-09 | Discharge: 2020-09-09 | Disposition: A | Discharge status: Home / Self Care | Payer: Medicare Other | Source: Ambulatory Visit | Attending: Nurse Practitioner | Admitting: Nurse Practitioner

## 2020-09-09 ENCOUNTER — Telehealth: Payer: Self-pay | Admitting: Physician Assistant

## 2020-09-09 DIAGNOSIS — I4819 Other persistent atrial fibrillation: Secondary | ICD-10-CM

## 2020-09-09 DIAGNOSIS — I4891 Unspecified atrial fibrillation: Secondary | ICD-10-CM | POA: Diagnosis not present

## 2020-09-09 DIAGNOSIS — I517 Cardiomegaly: Secondary | ICD-10-CM | POA: Insufficient documentation

## 2020-09-09 NOTE — Progress Notes (Signed)
Pt in to confirm that he was still in afib with CV pending tomorrow. [facility] nurse thought his rhythm  sounded regular. EKG shows afib at 87 bpm, qrs int 114 ms, qtc 488 ms. Proceed with plans for CV tomorrow.

## 2020-09-09 NOTE — Telephone Encounter (Signed)
New message   Per wife patient was discharged from the hospital with these , does he need to continue taking these ?  If so she needs refills called into [facility]? 30 days   hydrALAZINE (APRESOLINE) 25 MG tablet isosorbide mononitrate (IMDUR) 30 MG 24 hr tablet

## 2020-09-09 NOTE — Telephone Encounter (Signed)
I cannot find discharge summary, I will forward to [facility] for dispo.

## 2020-09-10 ENCOUNTER — Ambulatory Visit (HOSPITAL_COMMUNITY): Payer: Medicare Other | Admitting: Anesthesiology

## 2020-09-10 ENCOUNTER — Encounter (HOSPITAL_COMMUNITY): Payer: Self-pay | Admitting: Cardiovascular Disease

## 2020-09-10 ENCOUNTER — Ambulatory Visit (HOSPITAL_COMMUNITY)
Admission: RE | Admit: 2020-09-10 | Discharge: 2020-09-10 | Disposition: A | Discharge status: Home / Self Care | Payer: Medicare Other | Attending: Cardiovascular Disease | Admitting: Cardiovascular Disease

## 2020-09-10 ENCOUNTER — Encounter (HOSPITAL_COMMUNITY)
Admission: RE | Disposition: A | Discharge status: Home / Self Care | Payer: Self-pay | Source: Home / Self Care | Attending: Cardiovascular Disease

## 2020-09-10 ENCOUNTER — Other Ambulatory Visit: Payer: Self-pay

## 2020-09-10 DIAGNOSIS — D6869 Other thrombophilia: Secondary | ICD-10-CM | POA: Insufficient documentation

## 2020-09-10 DIAGNOSIS — I5022 Chronic systolic (congestive) heart failure: Secondary | ICD-10-CM | POA: Diagnosis not present

## 2020-09-10 DIAGNOSIS — I4891 Unspecified atrial fibrillation: Secondary | ICD-10-CM | POA: Diagnosis not present

## 2020-09-10 DIAGNOSIS — I890 Lymphedema, not elsewhere classified: Secondary | ICD-10-CM | POA: Diagnosis not present

## 2020-09-10 DIAGNOSIS — I4819 Other persistent atrial fibrillation: Secondary | ICD-10-CM | POA: Diagnosis not present

## 2020-09-10 DIAGNOSIS — Z7901 Long term (current) use of anticoagulants: Secondary | ICD-10-CM | POA: Insufficient documentation

## 2020-09-10 DIAGNOSIS — K219 Gastro-esophageal reflux disease without esophagitis: Secondary | ICD-10-CM | POA: Diagnosis not present

## 2020-09-10 DIAGNOSIS — Z881 Allergy status to other antibiotic agents status: Secondary | ICD-10-CM | POA: Insufficient documentation

## 2020-09-10 DIAGNOSIS — Z79899 Other long term (current) drug therapy: Secondary | ICD-10-CM | POA: Insufficient documentation

## 2020-09-10 DIAGNOSIS — I110 Hypertensive heart disease with heart failure: Secondary | ICD-10-CM | POA: Diagnosis not present

## 2020-09-10 HISTORY — PX: CARDIOVERSION: SHX1299

## 2020-09-10 LAB — POCT I-STAT, CHEM 8
BUN: 18 mg/dL (ref 8–23)
Calcium, Ion: 1.18 mmol/L (ref 1.15–1.40)
Chloride: 105 mmol/L (ref 98–111)
Creatinine, Ser: 1.3 mg/dL — ABNORMAL HIGH (ref 0.61–1.24)
Glucose, Bld: 101 mg/dL — ABNORMAL HIGH (ref 70–99)
HCT: 47 % (ref 39.0–52.0)
Hemoglobin: 16 g/dL (ref 13.0–17.0)
Potassium: 3.7 mmol/L (ref 3.5–5.1)
Sodium: 141 mmol/L (ref 135–145)
TCO2: 24 mmol/L (ref 22–32)

## 2020-09-10 SURGERY — CARDIOVERSION
Anesthesia: General

## 2020-09-10 MED ORDER — SODIUM CHLORIDE 0.9 % IV SOLN: INTRAVENOUS | Status: DC | PRN

## 2020-09-10 MED ORDER — PROPOFOL 10 MG/ML IV BOLUS
INTRAVENOUS | Status: DC | PRN
  Administered 2020-09-10: 20 mg via INTRAVENOUS
  Administered 2020-09-10: 50 mg via INTRAVENOUS

## 2020-09-10 MED ORDER — LIDOCAINE 2% (20 MG/ML) 5 ML SYRINGE
INTRAMUSCULAR | Status: DC | PRN
  Administered 2020-09-10: 50 mg via INTRAVENOUS

## 2020-09-10 NOTE — Discharge Instructions (Signed)

## 2020-09-10 NOTE — Anesthesia Postprocedure Evaluation (Signed)
Anesthesia Post Note  Patient: Darius Smith  Procedure(s) Performed: CARDIOVERSION (N/A )     Patient location during evaluation: PACU Anesthesia Type: General Level of consciousness: awake and alert and oriented Pain management: pain level controlled Vital Signs Assessment: post-procedure vital signs reviewed and stable Respiratory status: spontaneous breathing, nonlabored ventilation and respiratory function stable Cardiovascular status: blood pressure returned to baseline and stable Postop Assessment: no apparent nausea or vomiting Anesthetic complications: no   No complications documented.  Last Vitals:  Vitals:   09/10/20 0850 09/10/20 0855  BP: 138/84 113/72  Pulse: (!) 48 (!) 40  Resp: (!) 22 13  Temp:    SpO2: 100% 100%    Last Pain:  Vitals:   09/10/20 0855  TempSrc:   PainSc: 0-No pain                 Krishna Heuer A.

## 2020-09-10 NOTE — Telephone Encounter (Signed)
Patient having cardioversion today with Dr.Nishan who will discuss discharge medications.

## 2020-09-10 NOTE — Anesthesia Procedure Notes (Signed)
Procedure Name: General with mask airway Date/Time: 09/10/2020 8:36 AM Performed by: Imagene Riches, CRNA Pre-anesthesia Checklist: Patient identified, Emergency Drugs available, Suction available, Patient being monitored and Timeout performed Oxygen Delivery Method: Ambu bag

## 2020-09-10 NOTE — Interval H&P Note (Signed)
History and Physical Interval Note:  09/10/2020 7:56 AM  Darius Smith  has presented today for surgery, with the diagnosis of A-FIB.  The various methods of treatment have been discussed with the patient and family. After consideration of risks, benefits and other options for treatment, the patient has consented to  Procedure(s): CARDIOVERSION (N/A) as a surgical intervention.  The patient's history has been reviewed, patient examined, no change in status, stable for surgery.  I have reviewed the patient's chart and labs.  Questions were answered to the patient's satisfaction.     Jenkins Rouge

## 2020-09-10 NOTE — Telephone Encounter (Signed)
I don't see the discharge summary either but he should stay on meds. Please make him a f/u appt in our office

## 2020-09-10 NOTE — Transfer of Care (Signed)
Immediate Anesthesia Transfer of Care Note  Patient: Darius Smith  Procedure(s) Performed: CARDIOVERSION (N/A )  Patient Location: Endoscopy Unit  Anesthesia Type:General  Level of Consciousness: drowsy  Airway & Oxygen Therapy: Patient Spontanous Breathing and Patient connected to face mask oxygen  Post-op Assessment: Report given to RN and Post -op Vital signs reviewed and stable  Post vital signs: Reviewed and stable  Last Vitals:  Vitals Value Taken Time  BP    Temp    Pulse    Resp    SpO2      Last Pain:  Vitals:   09/10/20 0803  TempSrc: Temporal  PainSc: 0-No pain         Complications: No complications documented.

## 2020-09-10 NOTE — CV Procedure (Signed)
DCC: Anesthesia:  Foster-> Propofol and Lidocaine  DCC x 1 150 J Converted from afib rate 88 to NSR rate 64 bpm  No missed doses of anticoagulation No immediate neurologic sequelae  Jenkins Rouge MD [facility]

## 2020-09-10 NOTE — Anesthesia Preprocedure Evaluation (Addendum)
Anesthesia Evaluation  Patient identified by MRN, date of birth, ID band Patient awake    Reviewed: Allergy & Precautions, NPO status , Patient's Chart, lab work & pertinent test results, reviewed documented beta blocker date and time   History of Anesthesia Complications (+) history of anesthetic complications  Airway Mallampati: II  TM Distance: >3 FB Neck ROM: Full    Dental  (+) Teeth Intact, Poor Dentition, Missing, Dental Advisory Given   Pulmonary shortness of breath and with exertion,    Pulmonary exam normal breath sounds clear to auscultation       Cardiovascular hypertension, Pt. on medications and Pt. on home beta blockers +CHF  + dysrhythmias Atrial Fibrillation  Rhythm:Irregular Rate:Normal  EKG 09/09/20 Atrial fibrillation, LVH, prolonged QTc  Echo 06/12/20 1. Left ventricular ejection fraction, by estimation, is 20 to 25%. The left ventricle has severely decreased function. The left ventricle demonstrates global hypokinesis. There is mild left ventricular hypertrophy. Left ventricular diastolic parameters are indeterminate.  2. Right ventricular systolic function is mildly reduced. The right  ventricular size is moderately enlarged.  3. Left atrial size was severely dilated.  4. Right atrial size was mildly dilated.  5. The mitral valve is normal in structure. Trivial mitral valve regurgitation. No evidence of mitral stenosis.  6. The aortic valve was not well visualized. There is mild calcification of the aortic valve. There is mild thickening of the aortic valve. Aortic valve regurgitation is not visualized. No aortic stenosis is present.  7. The inferior vena cava is normal in size with greater than 50% respiratory variability, suggesting right atrial pressure of 3 mmHg.    Neuro/Psych negative neurological ROS  negative psych ROS   GI/Hepatic GERD  Medicated and Controlled,  Endo/Other     Renal/GU Renal InsufficiencyRenal disease  negative genitourinary   Musculoskeletal  (+) Arthritis , Osteoarthritis,    Abdominal   Peds  Hematology Hx/o follicular lymphoma Eliquis therapy- last dose this am   Anesthesia Other Findings   Reproductive/Obstetrics                          Anesthesia Physical Anesthesia Plan  ASA: III  Anesthesia Plan: General   Post-op Pain Management:    Induction: Intravenous  PONV Risk Score and Plan: 2 and Treatment may vary due to age or medical condition  Airway Management Planned: Natural Airway and Mask  Additional Equipment:   Intra-op Plan:   Post-operative Plan:   Informed Consent: I have reviewed the patients History and Physical, chart, labs and discussed the procedure including the risks, benefits and alternatives for the proposed anesthesia with the patient or authorized representative who has indicated his/her understanding and acceptance.     Dental advisory given  Plan Discussed with: CRNA and Anesthesiologist  Anesthesia Plan Comments:        Anesthesia Quick Evaluation

## 2020-09-12 DIAGNOSIS — C8290 Follicular lymphoma, unspecified, unspecified site: Secondary | ICD-10-CM

## 2020-09-12 DIAGNOSIS — N179 Acute kidney failure, unspecified: Secondary | ICD-10-CM

## 2020-09-12 DIAGNOSIS — I272 Pulmonary hypertension, unspecified: Secondary | ICD-10-CM

## 2020-09-12 DIAGNOSIS — I5032 Chronic diastolic (congestive) heart failure: Secondary | ICD-10-CM | POA: Diagnosis not present

## 2020-09-12 DIAGNOSIS — I130 Hypertensive heart and chronic kidney disease with heart failure and stage 1 through stage 4 chronic kidney disease, or unspecified chronic kidney disease: Secondary | ICD-10-CM | POA: Diagnosis not present

## 2020-09-12 DIAGNOSIS — C3490 Malignant neoplasm of unspecified part of unspecified bronchus or lung: Secondary | ICD-10-CM

## 2020-09-12 DIAGNOSIS — I4891 Unspecified atrial fibrillation: Secondary | ICD-10-CM

## 2020-09-12 DIAGNOSIS — C7951 Secondary malignant neoplasm of bone: Secondary | ICD-10-CM

## 2020-09-12 DIAGNOSIS — E46 Unspecified protein-calorie malnutrition: Secondary | ICD-10-CM

## 2020-09-12 DIAGNOSIS — J9601 Acute respiratory failure with hypoxia: Secondary | ICD-10-CM

## 2020-09-12 DIAGNOSIS — I2510 Atherosclerotic heart disease of native coronary artery without angina pectoris: Secondary | ICD-10-CM | POA: Diagnosis not present

## 2020-09-12 DIAGNOSIS — N1832 Chronic kidney disease, stage 3b: Secondary | ICD-10-CM | POA: Diagnosis not present

## 2020-09-16 ENCOUNTER — Telehealth (HOSPITAL_COMMUNITY): Payer: Self-pay | Admitting: *Deleted

## 2020-09-16 MED ORDER — METOPROLOL SUCCINATE ER 100 MG PO TB24: 100.0000 mg | ORAL_TABLET | Freq: Every day | ORAL | 3 refills | Status: DC

## 2020-09-16 NOTE — Progress Notes (Signed)
Primary Care Physician: Perlie Mayo, NP Primary Cardiologist: Dr Harl Bowie Primary Electrophysiologist: none Referring Physician: Ermalinda Barrios   Darius Smith is a 74 y.o. male with a history of chronic systolic CHF, HTN, stage IV lymphoma, and persistent atrial fibrillation who presents for consultation in the [facility].  The patient was initially diagnosed with atrial fibrillation 2019 in the setting of a urology procedure. He was found to be back in afib on follow up 06/23/20 with a new cardiomyopathy, EF 20-25%. Patient is on Eliquis for a CHADS2VASC score of 3. He was admitted 07/14/20 for afib RVR with acute CHF. DCCV on 07/16/20 was unsuccessful and he was loaded on amiodarone. He underwent repeat DCCV on 08/21/20 which was initially successful but was back in afib on follow up12/6/21. Patient is mostly unaware of his afib with only mild palpitations. He denies any alcohol use. He has snored all his life 2/2 h/o cleft palate, no witnessed apnea or daytime somnolence.  On follow up today, patient is s/p DCCV on 09/10/20. He reports that he has done well since the procedure. He feels he can walk farther now. He does note some sluggishness and fleeting dizziness when he first gets up in the morning. He denies any bleeding issues on anticoagulation.   Today, he denies symptoms of palpitations, chest pain, shortness of breath, orthopnea, PND, lower extremity edema, presyncope, syncope, daytime somnolence, bleeding, or neurologic sequela. The patient is tolerating medications without difficulties and is otherwise without complaint today.    Atrial Fibrillation Risk Factors:  he does not have symptoms or diagnosis of sleep apnea. he does not have a history of rheumatic fever. he does not have a history of alcohol use. The patient does not have a history of early familial atrial fibrillation or other arrhythmias.  he has a BMI of Body mass index is 24.93  kg/m.Marland Kitchen Filed Weights   09/17/20 0855  Weight: 83.4 kg    Family History  Problem Relation Age of Onset  . Breast cancer Mother   . Prostate cancer Father   . COPD Sister      Atrial Fibrillation Management history:  Previous antiarrhythmic drugs: amiodarone Previous cardioversions: 07/16/20, 08/21/20, 09/10/20 Previous ablations: none CHADS2VASC score: 3 Anticoagulation history: Eliquis   Past Medical History:  Diagnosis Date  . Allergic rhinitis 07/02/2019  . Arthritis    both hands  . Atrial fibrillation ([place]) 10/28/2017   Dr. Harl Bowie  . Atrial fibrillation with RVR ([place]) 07/14/2020  . Blood in urine    occ  . Bronchitis   . Cancer ([facility])    Basal cell  . Cardiomegaly 10/28/2017  . Complication of anesthesia    Mr. Cattell developed a. fib in recovery after cysto procedure 10/2017 was transferred to [facility] and underwent successful cardioversion  . Diverticulosis of sigmoid colon 08/2017   Noted on colonoscopy  . Dysrhythmia   . External hemorrhoids 08/2017  . GERD (gastroesophageal reflux disease)   . History of colon polyps 08/2017  . History of kidney stones   . History of rectal bleeding   . History of right inguinal hernia   . Hypertension   . Lymphoma ([place])   . Nephrolithiasis 07/02/2019  . Rectal bleeding 09/02/2017   Added automatically from request for surgery [id number]  . Shortness of breath    Past Surgical History:  Procedure Laterality Date  . CARDIOVERSION     10/2017  . CARDIOVERSION N/A 07/16/2020   Procedure: CARDIOVERSION;  Surgeon: Arnoldo Lenis, MD;  Location: [facility] ENDO SUITE;  Service: Endoscopy;  Laterality: N/A;  . CARDIOVERSION N/A 08/21/2020   Procedure: CARDIOVERSION;  Surgeon: Satira Sark, MD;  Location: [facility] ORS;  Service: Cardiovascular;  Laterality: N/A;  . CARDIOVERSION N/A 09/10/2020   Procedure: CARDIOVERSION;  Surgeon: Josue Hector, MD;  Location: [facility] ENDOSCOPY;  Service: Cardiovascular;  Laterality: N/A;  .  CHOLECYSTECTOMY    . 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 CLEFT PALATE REPAIR     several from childhood til 74 years old  . COLONOSCOPY  04/28/2011   Procedure: COLONOSCOPY;  Surgeon: Rogene Houston, MD;  Location: [facility] ENDO SUITE;  Service: Endoscopy;  Laterality: N/A;  . COLONOSCOPY N/A 07/18/2014   Procedure: COLONOSCOPY;  Surgeon: Rogene Houston, MD;  Location: [facility] ENDO SUITE;  Service: Endoscopy;  Laterality: N/A;  830  . COLONOSCOPY N/A 09/09/2017   Procedure: COLONOSCOPY;  Surgeon: Rogene Houston, MD;  Location: [facility] ENDO SUITE;  Service: Endoscopy;  Laterality: N/A;  955  . CYSTOSCOPY/URETEROSCOPY/HOLMIUM LASER/STENT PLACEMENT Bilateral 12/06/2017   Procedure: CYSTOSCOPY/RETROGRADE/URETEROSCOPY/HOLMIUM LASER/STENT EXCHANGE;  Surgeon: Festus Aloe, MD;  Location: [facility] ORS;  Service: Urology;  Laterality: Bilateral;  ONLY NEEDS 60 MIN  . HERNIA REPAIR    . POLYPECTOMY  09/09/2017   Procedure: POLYPECTOMY;  Surgeon: Rogene Houston, MD;  Location: [facility] ENDO SUITE;  Service: Endoscopy;;  colon  . vocal cord surgery      Current Outpatient Medications  Medication Sig Dispense Refill  . acetaminophen (TYLENOL) 500 MG tablet Take 1,000 mg by mouth every 6 (six) hours as needed for moderate pain or headache.    Marland Kitchen amiodarone (PACERONE) 200 MG tablet Take 1 tablet by mouth twice a day for 3 weeks then reduce back to 1 tablet a day (Patient taking differently: Take 1 tablet by mouth daily) 60 tablet 0  . cetirizine (ZYRTEC) 10 MG tablet Take 1 tablet (10 mg total) by mouth daily as needed for allergies. 90 tablet 2  . ELIQUIS 5 MG TABS tablet TAKE ONE TABLET BY MOUTH 2 TIMES A DAY. 60 tablet 6  . furosemide (LASIX) 40 MG tablet 40mg  in AM, 20mg  at 4 pm. 45 tablet 1  . hydrALAZINE (APRESOLINE) 25 MG tablet Take 1 tablet (25 mg total) by mouth 3 (three) times daily. 90 tablet 1  . isosorbide mononitrate (IMDUR) 30 MG 24 hr tablet Take 0.5 tablets (15 mg total) by mouth  daily. 30 tablet 1  . metoprolol succinate (TOPROL-XL) 100 MG 24 hr tablet Take 1 tablet (100 mg total) by mouth daily. Take with or immediately following a meal. (Patient taking differently: Take 150 mg by mouth daily. Take with or immediately following a meal.) 135 tablet 3  . omeprazole (PRILOSEC) 20 MG capsule     . potassium chloride SA (KLOR-CON) 20 MEQ tablet Take 1 tablet (20 mEq total) by mouth daily. 90 tablet 3  . VENTOLIN HFA 108 (90 Base) MCG/ACT inhaler Inhale 2 puffs into the lungs every 6 (six) hours as needed for wheezing or shortness of breath. 18 g 3   No current facility-administered medications for this encounter.    Allergies  Allergen Reactions  . Augmentin [Amoxicillin-Pot Clavulanate] Other (See Comments)    Increased liver enzymes Has patient had a PCN reaction causing immediate rash, facial/tongue/throat swelling, SOB or lightheadedness with hypotension: No Has patient had a PCN reaction causing severe rash involving mucus membranes  or skin necrosis: No Has patient had a PCN reaction that required hospitalization: No Has patient had a PCN reaction occurring within the last 10 years: No  If all of the above answers are "NO", then may proceed with Cephalosporin use.   Marland Kitchen Flomax [Tamsulosin Hcl] Nausea Only    Dizziness  Pt is on flomax    Social History   Socioeconomic History  . Marital status: Married    Spouse name: Not on file  . Number of children: 2  . Years of education: Not on file  . Highest education level: Not on file  Occupational History  . Occupation: retired  Tobacco Use  . Smoking status: Never Smoker  . Smokeless tobacco: Never Used  Vaping Use  . Vaping Use: Never used  Substance and Sexual Activity  . Alcohol use: Not Currently    Alcohol/week: 7.0 standard drinks    Types: 7 Cans of beer per week  . Drug use: No  . Sexual activity: Not on file  Other Topics Concern  . Not on file  Social History Narrative   Lives with wife  [name] of 67 years April 15, 2020      2 children, 2 grandchildren   Dog: Gardner Candle      Enjoys: being outdoors       Diet: eats all food groups    Caffeine: drink coffee in the morning, some tea, diet dr pepper   Water: 3-4 cups daily         Wears seat belt    Does not use phone while driving   [place] put up not loaded          Social Determinants of Radio broadcast assistant Strain: Low Risk   . Difficulty of Paying Living Expenses: Not hard at all  Food Insecurity: No Food Insecurity  . Worried About Charity fundraiser in the Last Year: Never true  . Ran Out of Food in the Last Year: Never true  Transportation Needs: No Transportation Needs  . Lack of Transportation (Medical): No  . Lack of Transportation (Non-Medical): No  Physical Activity: Sufficiently Active  . Days of Exercise per Week: 7 days  . Minutes of Exercise per Session: 60 min  Stress: No Stress Concern Present  . Feeling of Stress : Not at all  Social Connections: Moderately Integrated  . Frequency of Communication with Friends and Family: More than three times a week  . Frequency of Social Gatherings with Friends and Family: Twice a week  . Attends Religious Services: More than 4 times per year  . Active Member of Clubs or Organizations: No  . Attends Archivist Meetings: Never  . Marital Status: Married  Human resources officer Violence: Not At Risk  . Fear of Current or Ex-Partner: No  . Emotionally Abused: No  . Physically Abused: No  . Sexually Abused: No     ROS- All systems are reviewed and negative except as per the HPI above.  Physical Exam: Vitals:   09/17/20 0855  BP: 130/80  Pulse: (!) 45  Weight: 83.4 kg  Height: 6' (1.829 m)    GEN- The patient is well appearing elderly male, alert and oriented x 3 today.   HEENT-head normocephalic, atraumatic, sclera clear, conjunctiva pink, hearing intact, trachea midline. Lungs- Clear to ausculation  bilaterally, normal work of breathing Heart- Regular rate and rhythm, bradycardia, no murmurs, rubs or gallops  GI- soft, NT,  ND, + BS Extremities- no clubbing, cyanosis, or edema MS- no significant deformity or atrophy Skin- no rash or lesion Psych- euthymic mood, full affect Neuro- strength and sensation are intact   Wt Readings from Last 3 Encounters:  09/17/20 83.4 kg  09/10/20 80.3 kg  08/27/20 81.7 kg    EKG today demonstrates SB HR 45, LVH with repol, PR 162, QRS 112, QTc 512  Echo 06/12/20 demonstrated  1. Left ventricular ejection fraction, by estimation, is 20 to 25%. The  left ventricle has severely decreased function. The left ventricle  demonstrates global hypokinesis. There is mild left ventricular  hypertrophy. Left ventricular diastolic parameters  are indeterminate.  2. Right ventricular systolic function is mildly reduced. The right  ventricular size is moderately enlarged.  3. Left atrial size was severely dilated.  4. Right atrial size was mildly dilated.  5. The mitral valve is normal in structure. Trivial mitral valve  regurgitation. No evidence of mitral stenosis.  6. The aortic valve was not well visualized. There is mild calcification  of the aortic valve. There is mild thickening of the aortic valve. Aortic  valve regurgitation is not visualized. No aortic stenosis is present.  7. The inferior vena cava is normal in size with greater than 50%  respiratory variability, suggesting right atrial pressure of 3 mmHg.   Epic records are reviewed at length today  CHA2DS2-VASc Score = 3  The patient's score is based upon: CHF History: Yes HTN History: Yes Diabetes History: No Stroke History: No Vascular Disease History: No Age Score: 1 Gender Score: 0      ASSESSMENT AND PLAN: 1. Persistent Atrial Fibrillation (ICD10:  I48.19) The patient's CHA2DS2-VASc score is 3, indicating a 3.2% annual risk of stroke.   S/p DCCV on 08/21/20 with ERAF.  Severely dilated LA. S/p repeat DCCV on 09/10/20 Patient has not decreased metoprolol as instructed. Will decrease Toprol to 100 mg daily today. Continue amiodarone 200 mg daily Ultimately, rate control may be his best option. ? If he would be candidate for AV node ablation with h/o stage IV lymphoma.  Continue Eliquis 5 mg BID with no missed doses for at least 4 weeks post DCCV.  2. Secondary Hypercoagulable State (ICD10:  D68.69) The patient is at significant risk for stroke/thromboembolism based upon his CHA2DS2-VASc Score of 3.  Continue Apixaban (Eliquis).   3. Chronic systolic CHF EF 16-10% No signs or symptoms of fluid overload today.  4. Stage IV lymphoma Not treating per oncology as he is asymptomatic. Per Dr Harl Bowie notes, prognosis is a few years to decades.    Follow up with Dr Harl Bowie in one month. AF clinic in 3 months.    [facility] [address] [place], [place] [address] [phone] 09/17/2020 9:35 AM

## 2020-09-16 NOTE — Telephone Encounter (Signed)
Patient's home health RN called stating pt is short of breath, dizzy and having HRs in the 30-40s. Discussed with Roderic Palau NP will decrease metoprolol to 100mg  a day and has follow up tomorrow. ER precautions reviewed.

## 2020-09-17 ENCOUNTER — Other Ambulatory Visit: Payer: Self-pay

## 2020-09-17 ENCOUNTER — Encounter (HOSPITAL_COMMUNITY): Payer: Self-pay | Admitting: Physician Assistant

## 2020-09-17 ENCOUNTER — Ambulatory Visit (HOSPITAL_COMMUNITY)
Admission: RE | Admit: 2020-09-17 | Discharge: 2020-09-17 | Disposition: A | Discharge status: Home / Self Care | Payer: Medicare Other | Source: Ambulatory Visit | Attending: Physician Assistant | Admitting: Physician Assistant

## 2020-09-17 VITALS — BP 130/80 | HR 45 | Ht 72.0 in | Wt 183.8 lb

## 2020-09-17 DIAGNOSIS — Z79899 Other long term (current) drug therapy: Secondary | ICD-10-CM | POA: Diagnosis not present

## 2020-09-17 DIAGNOSIS — I5022 Chronic systolic (congestive) heart failure: Secondary | ICD-10-CM | POA: Diagnosis not present

## 2020-09-17 DIAGNOSIS — D6869 Other thrombophilia: Secondary | ICD-10-CM

## 2020-09-17 DIAGNOSIS — I4819 Other persistent atrial fibrillation: Secondary | ICD-10-CM | POA: Diagnosis not present

## 2020-09-17 DIAGNOSIS — C8590 Non-Hodgkin lymphoma, unspecified, unspecified site: Secondary | ICD-10-CM | POA: Diagnosis not present

## 2020-09-17 DIAGNOSIS — Z7901 Long term (current) use of anticoagulants: Secondary | ICD-10-CM | POA: Insufficient documentation

## 2020-09-17 DIAGNOSIS — I110 Hypertensive heart disease with heart failure: Secondary | ICD-10-CM | POA: Diagnosis not present

## 2020-09-17 NOTE — Patient Instructions (Signed)
Decrease metoprolol to 100mg  once a day

## 2020-09-23 ENCOUNTER — Other Ambulatory Visit: Payer: Self-pay

## 2020-09-23 ENCOUNTER — Inpatient Hospital Stay (HOSPITAL_COMMUNITY): Payer: Medicare Other | Attending: Hematology

## 2020-09-23 DIAGNOSIS — C8294 Follicular lymphoma, unspecified, lymph nodes of axilla and upper limb: Secondary | ICD-10-CM

## 2020-09-23 DIAGNOSIS — C8299 Follicular lymphoma, unspecified, extranodal and solid organ sites: Secondary | ICD-10-CM | POA: Diagnosis not present

## 2020-09-23 LAB — COMPREHENSIVE METABOLIC PANEL
ALT: 18 U/L (ref 0–44)
AST: 16 U/L (ref 15–41)
Albumin: 3.4 g/dL — ABNORMAL LOW (ref 3.5–5.0)
Alkaline Phosphatase: 64 U/L (ref 38–126)
Anion gap: 9 (ref 5–15)
BUN: 16 mg/dL (ref 8–23)
CO2: 27 mmol/L (ref 22–32)
Calcium: 8.9 mg/dL (ref 8.9–10.3)
Chloride: 103 mmol/L (ref 98–111)
Creatinine, Ser: 1.4 mg/dL — ABNORMAL HIGH (ref 0.61–1.24)
GFR, Estimated: 53 mL/min — ABNORMAL LOW (ref >60)
Glucose, Bld: 104 mg/dL — ABNORMAL HIGH (ref 70–99)
Potassium: 3.9 mmol/L (ref 3.5–5.1)
Sodium: 139 mmol/L (ref 135–145)
Total Bilirubin: 0.8 mg/dL (ref 0.3–1.2)
Total Protein: 7.3 g/dL (ref 6.5–8.1)

## 2020-09-23 LAB — CBC WITH DIFFERENTIAL/PLATELET
Abs Immature Granulocytes: 0.02 10*3/uL (ref 0.00–0.07)
Basophils Absolute: 0.1 10*3/uL (ref 0.0–0.1)
Basophils Relative: 1 %
Eosinophils Absolute: 0.2 10*3/uL (ref 0.0–0.5)
Eosinophils Relative: 3 %
HCT: 45.5 % (ref 39.0–52.0)
Hemoglobin: 13.9 g/dL (ref 13.0–17.0)
Immature Granulocytes: 0 %
Lymphocytes Relative: 24 %
Lymphs Abs: 1.9 10*3/uL (ref 0.7–4.0)
MCH: 28.1 pg (ref 26.0–34.0)
MCHC: 30.5 g/dL (ref 30.0–36.0)
MCV: 91.9 fL (ref 80.0–100.0)
Monocytes Absolute: 0.8 10*3/uL (ref 0.1–1.0)
Monocytes Relative: 11 %
Neutro Abs: 4.7 10*3/uL (ref 1.7–7.7)
Neutrophils Relative %: 61 %
Platelets: 276 10*3/uL (ref 150–400)
RBC: 4.95 MIL/uL (ref 4.22–5.81)
RDW: 16.2 % — ABNORMAL HIGH (ref 11.5–15.5)
WBC: 7.7 10*3/uL (ref 4.0–10.5)
nRBC: 0 % (ref 0.0–0.2)

## 2020-09-23 LAB — URIC ACID: Uric Acid, Serum: 5.1 mg/dL (ref 3.7–8.6)

## 2020-09-23 LAB — LACTATE DEHYDROGENASE: LDH: 123 U/L (ref 98–192)

## 2020-09-25 ENCOUNTER — Ambulatory Visit: Payer: Medicare Other | Admitting: Cardiology

## 2020-09-25 ENCOUNTER — Other Ambulatory Visit: Payer: Self-pay

## 2020-09-25 ENCOUNTER — Encounter: Payer: Self-pay | Admitting: Cardiology

## 2020-09-25 VITALS — BP 168/82 | HR 48 | Resp 16 | Ht 72.0 in | Wt 181.8 lb

## 2020-09-25 DIAGNOSIS — I5022 Chronic systolic (congestive) heart failure: Secondary | ICD-10-CM | POA: Diagnosis not present

## 2020-09-25 DIAGNOSIS — I4819 Other persistent atrial fibrillation: Secondary | ICD-10-CM

## 2020-09-25 MED ORDER — METOPROLOL SUCCINATE ER 50 MG PO TB24: 50.0000 mg | ORAL_TABLET | Freq: Every day | ORAL | 6 refills | Status: DC

## 2020-09-25 NOTE — Patient Instructions (Addendum)
Medication Instructions:   Your physician has recommended you make the following change in your medication:   Decrease metoprolol succinate to 50 mg daily. Break your 100 mg tablet in half daily  Continue other medications the same  Labwork:  None  Testing/Procedures:  None  Follow-Up:  Your physician recommends that you schedule a follow-up appointment in: 1 month.  Any Other Special Instructions Will Be Listed Below (If Applicable).  If you need a refill on your cardiac medications before your next appointment, please call your pharmacy.

## 2020-09-25 NOTE — Progress Notes (Signed)
Clinical Summary Mr. Coutant is a 75 y.o.male seen today for follow up of the following medical problems.   1. Chronic systolic HF - 09/6008 echo LVEF 20-25%. New diagnosis of systolic HF at that time.  - recent admission 06/2020 with volume overload. BNP  2000s, CXR with edema.  - diuresis was limited by elevation in Cr - with elevation in Cr we stopped his entresto, started hydral/imdur.  - systolic dysfunction thought to be tachy mediated as significant issues with afib with RVR at the same time.  - have not pursued ischemic testing as of yet, repeat echo few months after afib controlled and pending renal function  - no recent edema, no SOB or DOE. - home weights trending down, typically 174-175 lbs.  - taking lasix 40mg  in AM, and 20mg  just prn.     2. Persistent afib - dynamaps do not accuratelty measure his HRs, best measured by EKG>  - issues with elevated HRs recently including during recent admission - difficulty controlling with beta blocker alone, avoiding CCB due to systolic dysfunction, renal function and HF limit antiarrhythmic options. During admission we started amiodarone - failed attempt at [place] 07/16/20, plan to continue oral load and retry in 3 weeks.   - on amio, DCCVs on 12/2 and 09/10/20, last 09/10/20 conversion was succesful. He as in sinus brady at afib clinic f/u 12/29 - was to lower toprol to 100mg  after last EP visit due to low HRs  - no recent palpitations - HRs mid 40s at home    3. Stage IV lymphoma - from onc notes just monitoring at this time, in absence of symptoms no indciation for therapy. - I did hear back from oncology, they report his lymphoma is not curable and treatment only helps symptoms. Prognosis can be a few years to a few decades, and they do not see a contraindciation to proceeding with cath in the future.  4. HTN - home bp's 120/80    Past Medical History:  Diagnosis Date  . Allergic rhinitis 07/02/2019  .  Arthritis    both hands  . Atrial fibrillation ([place]) 10/28/2017   Dr. Harl Bowie  . Atrial fibrillation with RVR ([place]) 07/14/2020  . Blood in urine    occ  . Bronchitis   . Cancer (HCC)    Basal cell  . Cardiomegaly 10/28/2017  . Complication of anesthesia    Mr. Sylla developed a. fib in recovery after cysto procedure 10/2017 was transferred to [facility] and underwent successful cardioversion  . Diverticulosis of sigmoid colon 08/2017   Noted on colonoscopy  . Dysrhythmia   . External hemorrhoids 08/2017  . GERD (gastroesophageal reflux disease)   . History of colon polyps 08/2017  . History of kidney stones   . History of rectal bleeding   . History of right inguinal hernia   . Hypertension   . Lymphoma ([place])   . Nephrolithiasis 07/02/2019  . Rectal bleeding 09/02/2017   Added automatically from request for surgery [id number]  . Shortness of breath      Allergies  Allergen Reactions  . Augmentin [Amoxicillin-Pot Clavulanate] Other (See Comments)    Increased liver enzymes Has patient had a PCN reaction causing immediate rash, facial/tongue/throat swelling, SOB or lightheadedness with hypotension: No Has patient had a PCN reaction causing severe rash involving mucus membranes or skin necrosis: No Has patient had a PCN reaction that required hospitalization: No Has patient had a PCN reaction occurring within the last 10 years:  No  If all of the above answers are "NO", then may proceed with Cephalosporin use.   Marland Kitchen Flomax [Tamsulosin Hcl] Nausea Only    Dizziness  Pt is on flomax     Current Outpatient Medications  Medication Sig Dispense Refill  . acetaminophen (TYLENOL) 500 MG tablet Take 1,000 mg by mouth every 6 (six) hours as needed for moderate pain or headache.    Marland Kitchen amiodarone (PACERONE) 200 MG tablet Take 1 tablet by mouth twice a day for 3 weeks then reduce back to 1 tablet a day (Patient taking differently: Take 1 tablet by mouth daily) 60 tablet 0  . cetirizine (ZYRTEC) 10  MG tablet Take 1 tablet (10 mg total) by mouth daily as needed for allergies. 90 tablet 2  . ELIQUIS 5 MG TABS tablet TAKE ONE TABLET BY MOUTH 2 TIMES A DAY. 60 tablet 6  . furosemide (LASIX) 40 MG tablet 40mg  in AM, 20mg  at 4 pm. 45 tablet 1  . hydrALAZINE (APRESOLINE) 25 MG tablet Take 1 tablet (25 mg total) by mouth 3 (three) times daily. 90 tablet 1  . isosorbide mononitrate (IMDUR) 30 MG 24 hr tablet Take 0.5 tablets (15 mg total) by mouth daily. 30 tablet 1  . metoprolol succinate (TOPROL-XL) 100 MG 24 hr tablet Take 1 tablet (100 mg total) by mouth daily. Take with or immediately following a meal. (Patient taking differently: Take 150 mg by mouth daily. Take with or immediately following a meal.) 135 tablet 3  . omeprazole (PRILOSEC) 20 MG capsule     . potassium chloride SA (KLOR-CON) 20 MEQ tablet Take 1 tablet (20 mEq total) by mouth daily. 90 tablet 3  . VENTOLIN HFA 108 (90 Base) MCG/ACT inhaler Inhale 2 puffs into the lungs every 6 (six) hours as needed for wheezing or shortness of breath. 18 g 3   No current facility-administered medications for this visit.     Past Surgical History:  Procedure Laterality Date  . CARDIOVERSION     10/2017  . CARDIOVERSION N/A 07/16/2020   Procedure: CARDIOVERSION;  Surgeon: Arnoldo Lenis, MD;  [facility];  Service: Endoscopy;  Laterality: N/A;  . CARDIOVERSION N/A 08/21/2020   Procedure: CARDIOVERSION;  Surgeon: Satira Sark, MD;  Location: [facility] OR[facility];  Service: Cardiovascular;  Laterality: N/A;  . CARDIOVERSION N/A 09/10/2020   Procedure: CARDIOVERSION;  Surgeon: Josue Hector, MD;  Location: [facility] ENDOSCOPY;  Service: Cardiovascular;  Laterality: N/A;  . CHOLECYSTECTOMY    . 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 CLEFT PALATE REPAIR     several from childhood til 75 years old  . COLONOSCOPY  04/28/2011   Procedure: COLONOSCOPY;  Surgeon: Rogene Houston, MD;  [facility];  Service:  Endoscopy;  Laterality: N/A;  . COLONOSCOPY N/A 07/18/2014   Procedure: COLONOSCOPY;  Surgeon: Rogene Houston, MD;  [facility];  Service: Endoscopy;  Laterality: N/A;  830  . COLONOSCOPY N/A 09/09/2017   Procedure: COLONOSCOPY;  Surgeon: Rogene Houston, MD;  [facility];  Service: Endoscopy;  Laterality: N/A;  955  . CYSTOSCOPY/URETEROSCOPY/HOLMIUM LASER/STENT PLACEMENT Bilateral 12/06/2017   Procedure: CYSTOSCOPY/RETROGRADE/URETEROSCOPY/HOLMIUM LASER/STENT EXCHANGE;  Surgeon: Festus Aloe, MD;  Location: [facility];  Service: Urology;  Laterality: Bilateral;  ONLY NEEDS 60 MIN  . HERNIA REPAIR    . POLYPECTOMY  09/09/2017   Procedure: POLYPECTOMY;  Surgeon: Rogene Houston, MD;  [facility];  Service: Endoscopy;;  colon  . vocal cord surgery       Allergies  Allergen Reactions  . Augmentin [Amoxicillin-Pot Clavulanate] Other (See Comments)    Increased liver enzymes Has patient had a PCN reaction causing immediate rash, facial/tongue/throat swelling, SOB or lightheadedness with hypotension: No Has patient had a PCN reaction causing severe rash involving mucus membranes or skin necrosis: No Has patient had a PCN reaction that required hospitalization: No Has patient had a PCN reaction occurring within the last 10 years: No  If all of the above answers are "NO", then may proceed with Cephalosporin use.   Marland Kitchen Flomax [Tamsulosin Hcl] Nausea Only    Dizziness  Pt is on flomax      Family History  Problem Relation Age of Onset  . Breast cancer Mother   . Prostate cancer Father   . COPD Sister      Social History Mr. Sax reports that he has never smoked. He has never used smokeless tobacco. Mr. Bastin reports previous alcohol use of about 7.0 standard drinks of alcohol per week.   Review of Systems CONSTITUTIONAL: No weight loss, fever, chills, weakness or fatigue.  HEENT: Eyes: No visual loss, blurred vision, double vision or yellow  sclerae.No hearing loss, sneezing, congestion, runny nose or sore throat.  SKIN: No rash or itching.  CARDIOVASCULAR: per hpi RESPIRATORY: No shortness of breath, cough or sputum.  GASTROINTESTINAL: No anorexia, nausea, vomiting or diarrhea. No abdominal pain or blood.  GENITOURINARY: No burning on urination, no polyuria NEUROLOGICAL: No headache, dizziness, syncope, paralysis, ataxia, numbness or tingling in the extremities. No change in bowel or bladder control.  MUSCULOSKELETAL: No muscle, back pain, joint pain or stiffness.  LYMPHATICS: No enlarged nodes. No history of splenectomy.  PSYCHIATRIC: No history of depression or anxiety.  ENDOCRINOLOGIC: No reports of sweating, cold or heat intolerance. No polyuria or polydipsia.  Marland Kitchen   Physical Examination Today's Vitals   09/25/20 1413  BP: (!) 168/82  Pulse: (!) 48  Resp: 16  SpO2: 98%  Weight: 181 lb 12.8 oz (82.5 kg)  Height: 6' (1.829 m)   Body mass index is 24.66 kg/m.  Gen: resting comfortably, no acute distress HEENT: no scleral icterus, pupils equal round and reactive, no palptable cervical adenopathy,  CV: RRR, no m/r/g, no jvd Resp: Clear to auscultation bilaterally GI: abdomen is soft, non-tender, non-distended, normal bowel sounds, no hepatosplenomegaly MSK: extremities are warm, no edema.  Skin: warm, no rash Neuro:  no focal deficits Psych: appropriate affect    Assessment and Plan  1. Chronic systolic HF - possibly tachy mediated given recent issues with afib with RVR - entresto stopped during recent admission with AKI. May reconsider pendign renal function going forward, may consider aldactone as well - would repeat echo around March, if LVEF remains decreased consdier cath at that time pending renal function. 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   2. Afib - EKG today shows sinus brady in 40s - lower  toprol to 50mg  daily. Continue amio, eliquis  3. HTN - elevated today but home numbers at goal - room to titrate hydralazine if needed      Arnoldo Lenis, M.D.

## 2020-09-29 ENCOUNTER — Ambulatory Visit: Payer: Medicare Other | Admitting: Family Medicine

## 2020-09-29 ENCOUNTER — Encounter: Payer: Self-pay | Admitting: Family Medicine

## 2020-09-29 VITALS — BP 160/88 | HR 49 | Ht 72.0 in | Wt 177.0 lb

## 2020-09-29 DIAGNOSIS — I5022 Chronic systolic (congestive) heart failure: Secondary | ICD-10-CM

## 2020-09-29 DIAGNOSIS — I4891 Unspecified atrial fibrillation: Secondary | ICD-10-CM | POA: Diagnosis not present

## 2020-09-29 DIAGNOSIS — I10 Essential (primary) hypertension: Secondary | ICD-10-CM | POA: Diagnosis not present

## 2020-09-29 MED ORDER — METOPROLOL SUCCINATE ER 25 MG PO TB24: 25.0000 mg | ORAL_TABLET | Freq: Every day | ORAL | Status: DC

## 2020-09-29 MED ORDER — HYDRALAZINE HCL 50 MG PO TABS
50.0000 mg | ORAL_TABLET | Freq: Three times a day (TID) | ORAL | 6 refills | Status: DC
Start: 2020-09-29 — End: 2020-10-24

## 2020-09-29 NOTE — Patient Instructions (Addendum)
Medication Instructions:   Increase Hydralazine to 50mg  three times per day.  Decrease Toprol XL to 25mg  daily.   Continue all other medications.    Labwork: none  Testing/Procedures:  Your physician has requested that you have an echocardiogram. Echocardiography is a painless test that uses sound waves to create images of your heart. It provides your doctor with information about the size and shape of your heart and how well your heart's chambers and valves are working. This procedure takes approximately one hour. There are no restrictions for this procedure - DUE THIS MARCH   Office will contact with results via phone or letter.    Follow-Up: Keep as already scheduled with Dr. Harl Bowie   Any Other Special Instructions Will Be Listed Below (If Applicable). Continue to keep log of BP & HR readings and call office in 2 weeks.  If you need a refill on your cardiac medications before your next appointment, please call your pharmacy.

## 2020-09-29 NOTE — Progress Notes (Addendum)
Cardiology Office Note  Date: 09/29/2020   ID: Darius Smith, Darius Smith [birth year], MRN [id number]  PCP:  Darius Mayo, NP  Cardiologist:  Darius Dolly, MD Electrophysiologist:  None   Chief Complaint: Follow-up systolic heart failure  History of Present Illness: Darius Smith is a 75 y.o. male with a history of systolic heart failure, atrial fibrillation, hypertension, stage IV lymphoma.   Recently saw Dr. Harl Smith on 09/25/2020. Last echocardiogram 05/2020 EF 20 to 25%.  New diagnosis of systolic heart failure at that time.  Recent admission October 2021 volume overload with BNP in the 2000's.  Chest x-ray with edema.  Diuresis limited by elevated creatinine.  Entresto stopped due to elevated creatinine.  Started hydralazine and Imdur.  Systolic dysfunction thought to be tachycardia mediated due to issues with atrial fibrillation with RVR.  Had not pursued ischemic testing.    Plan was to repeat echo a few months after atrial fibrillation control and pending renal function.  No recent shortness of breath or DOE/edema.  Home weights 174-175 pounds trending down.  Taking Lasix 40 a.m. and 20 mg.  Problems with elevated heart rates recently during admission.  Difficulty controlling heart rates with beta-blocker alone.  Limited antiarrhythmic options.  During admission amiodarone was started.  DCCV was not successful on July 16, 2020.  DCCV 09/10/2020 was successful.  He was in sinus bradycardia during atrial fibrillation clinic follow-up on 09/17/2020.  Was to lower Toprol to 100 mg after EP visit due to low heart rates.  No recent palpitations.  Heart rate mid 40s at home.    Stage IV lymphoma.  According to oncology it was not curable and treatment only helped symptoms.  Oncology did not see contra indication to proceeding with cardiac catheterization in the future.  Blood pressures were stable.  Chronic systolic heart failure considered possibly tachycardia mediated given recent issues with  A. fib RVR.  Entresto stopped during recent admission with AKI as mentioned above.  Dr. Harl Smith mentioned could reconsider pending renal function going forward.  May consider Aldactone as well.  Would repeat echo around March.  If EF remained decreased would consider cath at that time pending renal function.  Toprol was lowered to 50 mg daily.  EKG showed sinus bradycardia in the 40s.  Continue amiodarone and Eliquis.  Blood pressure was elevated on that visit but home numbers were at goal.  Room to titrate hydralazine if needed.   He is here for follow-up today.  Blood pressure remains elevated.  Patient's wife states she purchased a new blood pressure cuff and has a log of blood pressures with blood pressures ranging in the 676P to 950D systolic.  Heart rate is low today at 49.  He denies any anginal or exertional symptoms.  States he does have some vertigo-like symptoms sometimes when turning in the bed which he states is new.  Also complaining of some headaches recently.  Otherwise he denies any palpitations or arrhythmias, orthostatic symptoms, CVA or TIA-like symptoms, PND, orthopnea, lower extremity edema.  States has been weighing himself daily and ranging around 177.  States he is adhering to fluid restrictions, and salt restrictions, and weighing himself every day.   Past Medical History:  Diagnosis Date  . Allergic rhinitis 07/02/2019  . Arthritis    both hands  . Atrial fibrillation ([place]) 10/28/2017   Dr. Harl Smith  . Atrial fibrillation with RVR ([place]) 07/14/2020  . Blood in urine    occ  . Bronchitis   .  Cancer (HCC)    Basal cell  . Cardiomegaly 10/28/2017  . Complication of anesthesia    Darius Smith developed a. fib in recovery after cysto procedure 10/2017 was transferred to [facility] and underwent successful cardioversion  . Diverticulosis of sigmoid colon 08/2017   Noted on colonoscopy  . Dysrhythmia   . External hemorrhoids 08/2017  . GERD (gastroesophageal reflux disease)   .  History of colon polyps 08/2017  . History of kidney stones   . History of rectal bleeding   . History of right inguinal hernia   . Hypertension   . Lymphoma ([place])   . Nephrolithiasis 07/02/2019  . Rectal bleeding 09/02/2017   Added automatically from request for surgery [phone]  . Shortness of breath     Past Surgical History:  Procedure Laterality Date  . CARDIOVERSION     10/2017  . CARDIOVERSION N/A 07/16/2020   Procedure: CARDIOVERSION;  Surgeon: Darius Lenis, MD;  [facility];  Service: Endoscopy;  Laterality: N/A;  . CARDIOVERSION N/A 08/21/2020   Procedure: CARDIOVERSION;  Surgeon: Darius Sark, MD;  Location: [facility];  Service: Cardiovascular;  Laterality: N/A;  . CARDIOVERSION N/A 09/10/2020   Procedure: CARDIOVERSION;  Surgeon: Darius Hector, MD;  Location: [facility] ENDOSCOPY;  Service: Cardiovascular;  Laterality: N/A;  . CHOLECYSTECTOMY    . 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 CLEFT PALATE REPAIR     several from childhood til 75 years old  . COLONOSCOPY  04/28/2011   Procedure: COLONOSCOPY;  Surgeon: Rogene Houston, MD;  [facility];  Service: Endoscopy;  Laterality: N/A;  . COLONOSCOPY N/A 07/18/2014   Procedure: COLONOSCOPY;  Surgeon: Rogene Houston, MD;  [facility];  Service: Endoscopy;  Laterality: N/A;  830  . COLONOSCOPY N/A 09/09/2017   Procedure: COLONOSCOPY;  Surgeon: Rogene Houston, MD;  [facility];  Service: Endoscopy;  Laterality: N/A;  955  . CYSTOSCOPY/URETEROSCOPY/HOLMIUM LASER/STENT PLACEMENT Bilateral 12/06/2017   Procedure: CYSTOSCOPY/RETROGRADE/URETEROSCOPY/HOLMIUM LASER/STENT EXCHANGE;  Surgeon: Festus Aloe, MD;  Location: [facility];  Service: Urology;  Laterality: Bilateral;  ONLY NEEDS 60 MIN  . HERNIA REPAIR    . POLYPECTOMY  09/09/2017   Procedure: POLYPECTOMY;  Surgeon: Rogene Houston, MD;  [facility];  Service: Endoscopy;;  colon  . vocal  cord surgery      Current Outpatient Medications  Medication Sig Dispense Refill  . acetaminophen (TYLENOL) 500 MG tablet Take 1,000 mg by mouth every 6 (six) hours as needed for moderate pain or headache.    Marland Kitchen amiodarone (PACERONE) 200 MG tablet Take 200 mg by mouth daily.    . cetirizine (ZYRTEC) 10 MG tablet Take 1 tablet (10 mg total) by mouth daily as needed for allergies. 90 tablet 2  . ELIQUIS 5 MG TABS tablet TAKE ONE TABLET BY MOUTH 2 TIMES A DAY. 60 tablet 6  . furosemide (LASIX) 40 MG tablet 40mg  in AM, 20mg  at 4 pm. 45 tablet 1  . hydrALAZINE (APRESOLINE) 25 MG tablet Take 1 tablet (25 mg total) by mouth 3 (three) times daily. 90 tablet 1  . isosorbide mononitrate (IMDUR) 30 MG 24 hr tablet Take 0.5 tablets (15 mg total) by mouth daily. 30 tablet 1  . metoprolol succinate (TOPROL XL) 50 MG 24 hr tablet Take 1 tablet (50 mg total) by mouth daily. Take with or immediately following a meal. 30 tablet 6  .  omeprazole (PRILOSEC) 20 MG capsule     . potassium chloride SA (KLOR-CON) 20 MEQ tablet Take 1 tablet (20 mEq total) by mouth daily. 90 tablet 3  . VENTOLIN HFA 108 (90 Base) MCG/ACT inhaler Inhale 2 puffs into the lungs every 6 (six) hours as needed for wheezing or shortness of breath. 18 g 3   No current facility-administered medications for this visit.   Allergies:  Augmentin [amoxicillin-pot clavulanate] and Flomax [tamsulosin hcl]   Social History: The patient  reports that he has never smoked. He has never used smokeless tobacco. He reports previous alcohol use of about 7.0 standard drinks of alcohol per week. He reports that he does not use drugs.   Family History: The patient's family history includes Breast cancer in his mother; COPD in his sister; Prostate cancer in his father.   ROS:  Please see the history of present illness. Otherwise, complete review of systems is positive for none.  All other systems are reviewed and negative.   Physical Exam: VS:  BP (!) 160/88    Pulse (!) 49   Ht 6' (1.829 m)   Wt 177 lb (80.3 kg)   SpO2 94%   BMI 24.01 kg/m , BMI Body mass index is 24.01 kg/m.  Wt Readings from Last 3 Encounters:  09/29/20 177 lb (80.3 kg)  09/25/20 181 lb 12.8 oz (82.5 kg)  09/17/20 183 lb 12.8 oz (83.4 kg)    General: Patient appears comfortable at rest. Neck: Supple, no elevated JVP or carotid bruits, no thyromegaly. Lungs: Clear to auscultation, nonlabored breathing at rest. Cardiac: Bradycardic rate and rhythm, no S3 or significant systolic murmur, no pericardial rub. Abdomen: Soft, nontender, no hepatomegaly, bowel sounds present, no guarding or rebound. Extremities: No pitting edema, distal pulses 2+. Skin: Warm and dry. Musculoskeletal: No kyphosis. Neuropsychiatric: Alert and oriented x3, affect grossly appropriate.  ECG:  EKG on 09/25/2020 sinus bradycardia with a rate of 47, LVH with repolarization abnormality, prolonged QT  Recent Labwork: 07/31/2020: B Natriuretic Peptide 25.0 08/01/2020: Magnesium 2.0; TSH 1.156 09/23/2020: ALT 18; AST 16; BUN 16; Creatinine, Ser 1.40; Hemoglobin 13.9; Platelets 276; Potassium 3.9; Sodium 139     Component Value Date/Time   CHOL 111 07/31/2020 1131   TRIG 62 07/31/2020 1131   HDL 38 (L) 07/31/2020 1131   CHOLHDL 2.9 07/31/2020 1131   LDLCALC 59 07/31/2020 1131    Other Studies Reviewed Today:  Echocardiogram 06/12/2020 1. Left ventricular ejection fraction, by estimation, is 20 to 25%. The left ventricle has severely decreased function. The left ventricle demonstrates global hypokinesis. There is mild left ventricular hypertrophy. Left ventricular diastolic parameters are indeterminate. 2. Right ventricular systolic function is mildly reduced. The right ventricular size is moderately enlarged. 3. Left atrial size was severely dilated. 4. Right atrial size was mildly dilated. 5. The mitral valve is normal in structure. Trivial mitral valve regurgitation. No evidence of mitral  stenosis. 6. The aortic valve was not well visualized. There is mild calcification of the aortic valve. There is mild thickening of the aortic valve. Aortic valve regurgitation is not visualized. No aortic stenosis is present. 7. The inferior vena cava is normal in size with greater than 50% respiratory variability, suggesting right atrial pressure of 3 mmHg.  Assessment and Plan:  1. Chronic systolic heart failure ([place])   2. Atrial fibrillation, unspecified type ([place])   3. Essential hypertension     1. Chronic systolic heart failure (HCC) Denies any dyspnea on exertion or significant weight  gain.  No lower extremity edema noted on exam today.  States he weighed 172 pounds this morning on the scales.  Continue Imdur 15 mg daily.  We are increasing hydralazine to 50 mg p.o. 3 times daily due to continued elevation of blood pressures.  Decrease Toprol to 25 mg due to slow heart rate.  Heart rate today is 49 and regular.  Continue Lasix 40 mg a.m. and 20 mg p.m.  Get a follow-up echocardiogram around March 23 to reassess LV function.  2. Atrial fibrillation, unspecified type ([place]) Heart rate is 49 and regular today.  We are decreasing Toprol to 25 mg daily due to slow heart rate.  Continue amiodarone 200 mg p.o. daily.  Continue Eliquis 5 mg p.o. twice daily.  Patient believes the amiodarone has been causing him to have some nausea.  3. Essential hypertension Blood pressure remains elevated today at 160/88.  Wife brings with her a log of blood pressures ranging in the 100F to 121F systolic.  Increase hydralazine to 50 mg p.o. 3 times daily.  Call in 1 to 2 weeks with log of blood pressures.  We may need to titrate hydralazine based on readings.   Medication Adjustments/Labs and Tests Ordered: Current medicines are reviewed at length with the patient today.  Concerns regarding medicines are outlined above.   Disposition: Follow-up with Dr. Harl Smith at scheduled follow-up in  February  Signed, Levell July, NP 09/29/2020 1:33 PM    [facility] [place], [place], [place] [address] Phone: [phone]; Fax: [phone]

## 2020-09-30 ENCOUNTER — Inpatient Hospital Stay (HOSPITAL_BASED_OUTPATIENT_CLINIC_OR_DEPARTMENT_OTHER): Payer: Medicare Other | Admitting: Hematology

## 2020-09-30 VITALS — BP 153/83 | HR 48 | Temp 97.1°F | Resp 18 | Wt 177.0 lb

## 2020-09-30 DIAGNOSIS — C8294 Follicular lymphoma, unspecified, lymph nodes of axilla and upper limb: Secondary | ICD-10-CM | POA: Diagnosis not present

## 2020-09-30 DIAGNOSIS — C8299 Follicular lymphoma, unspecified, extranodal and solid organ sites: Secondary | ICD-10-CM | POA: Diagnosis not present

## 2020-09-30 NOTE — Progress Notes (Signed)
[place], [place] [address]   CLINIC:  Medical Oncology/Hematology  PCP:  Perlie Mayo, NP [place] / [place] [address]  [phone]  REASON FOR VISIT:  Follow-up for follicular lymphoma  PRIOR THERAPY: None  CURRENT THERAPY: Observation  INTERVAL HISTORY:  [name], a 75 y.o. 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 [name] was last seen on 06/25/2020.  Today he is accompanied by his wife and he reports feeilng well. He denies having any recent infections, F/C, night sweats or weight loss. His energy levels are improving since he had the bout of heart failure and is going to therapy. He get a fit-bit and is tracking his pulse daily, ranging from the upper 40's to 100.  He saw his cardiologist on 09/29/2020 for his CHF.   REVIEW OF SYSTEMS:  Review of Systems  Constitutional: Positive for fatigue (75%; improving). Negative for appetite change, chills, diaphoresis, fever and unexpected weight change.  Neurological: Positive for dizziness.  All other systems reviewed and are negative.   PAST MEDICAL/SURGICAL HISTORY:  Past Medical History:  Diagnosis Date  . Allergic rhinitis 07/02/2019  . Arthritis    both hands  . Atrial fibrillation ([place]) 10/28/2017   Dr. Harl Bowie  . Atrial fibrillation with RVR ([place]) 07/14/2020  . Blood in urine    occ  . Bronchitis   . Cancer ([facility])    Basal cell  . Cardiomegaly 10/28/2017  . Complication of anesthesia    Mr. Soderman developed a. fib in recovery after cysto procedure 10/2017 was transferred to [facility] and underwent successful cardioversion  . Diverticulosis of sigmoid colon 08/2017   Noted on colonoscopy  . Dysrhythmia   . External hemorrhoids 08/2017  . GERD (gastroesophageal reflux disease)   . History of colon polyps 08/2017  . History of kidney stones   . History of rectal bleeding   . History of right inguinal hernia   . Hypertension   .  Lymphoma ([place])   . Nephrolithiasis 07/02/2019  . Rectal bleeding 09/02/2017   Added automatically from request for surgery [phone]  . Shortness of breath    Past Surgical History:  Procedure Laterality Date  . CARDIOVERSION     10/2017  . CARDIOVERSION N/A 07/16/2020   Procedure: CARDIOVERSION;  Surgeon: Arnoldo Lenis, MD;  [facility];  Service: Endoscopy;  Laterality: N/A;  . CARDIOVERSION N/A 08/21/2020   Procedure: CARDIOVERSION;  Surgeon: Satira Sark, MD;  Location: [facility] ORS;  Service: Cardiovascular;  Laterality: N/A;  . CARDIOVERSION N/A 09/10/2020   Procedure: CARDIOVERSION;  Surgeon: Josue Hector, MD;  Location: [facility] ENDOSCOPY;  Service: Cardiovascular;  Laterality: N/A;  . CHOLECYSTECTOMY    . 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 CLEFT PALATE REPAIR     several from childhood til 75 years old  . COLONOSCOPY  04/28/2011   Procedure: COLONOSCOPY;  Surgeon: Rogene Houston, MD;  Location: [facility];  Service: Endoscopy;  Laterality: N/A;  . COLONOSCOPY N/A 07/18/2014   Procedure: COLONOSCOPY;  Surgeon: Rogene Houston, MD;  Location: [facility];  Service: Endoscopy;  Laterality: N/A;  830  . COLONOSCOPY N/A 09/09/2017   Procedure: COLONOSCOPY;  Surgeon: Rogene Houston, MD;  Location: [facility];  Service: Endoscopy;  Laterality: N/A;  955  . CYSTOSCOPY/URETEROSCOPY/HOLMIUM LASER/STENT PLACEMENT Bilateral 12/06/2017   Procedure: CYSTOSCOPY/RETROGRADE/URETEROSCOPY/HOLMIUM LASER/STENT EXCHANGE;  Surgeon: Festus Aloe, MD;  Location: [facility] ORS;  Service: Urology;  Laterality: Bilateral;  ONLY NEEDS 60 MIN  . HERNIA REPAIR    . POLYPECTOMY  09/09/2017   Procedure: POLYPECTOMY;  Surgeon: Rogene Houston, MD;  Location: [facility];  Service: Endoscopy;;  colon  . vocal cord surgery      SOCIAL HISTORY:  Social History   Socioeconomic History  . Marital status: Married    Spouse name: Not on file  . Number of  children: 2  . Years of education: Not on file  . Highest education level: Not on file  Occupational History  . Occupation: retired  Tobacco Use  . Smoking status: Never Smoker  . Smokeless tobacco: Never Used  Vaping Use  . Vaping Use: Never used  Substance and Sexual Activity  . Alcohol use: Not Currently    Alcohol/week: 7.0 standard drinks    Types: 7 Cans of beer per week  . Drug use: No  . Sexual activity: Not on file  Other Topics Concern  . Not on file  Social History Narrative   Lives with wife [name] of 65 years April 15, 2020      2 children, 2 grandchildren   Dog: Gardner Candle      Enjoys: being outdoors       Diet: eats all food groups    Caffeine: drink coffee in the morning, some tea, diet dr pepper   Water: 3-4 cups daily         Wears seat belt    Does not use phone while driving   [place] put up not loaded          Social Determinants of Radio broadcast assistant Strain: Low Risk   . Difficulty of Paying Living Expenses: Not hard at all  Food Insecurity: No Food Insecurity  . Worried About Charity fundraiser in the Last Year: Never true  . Ran Out of Food in the Last Year: Never true  Transportation Needs: No Transportation Needs  . Lack of Transportation (Medical): No  . Lack of Transportation (Non-Medical): No  Physical Activity: Sufficiently Active  . Days of Exercise per Week: 7 days  . Minutes of Exercise per Session: 60 min  Stress: No Stress Concern Present  . Feeling of Stress : Not at all  Social Connections: Moderately Integrated  . Frequency of Communication with Friends and Family: More than three times a week  . Frequency of Social Gatherings with Friends and Family: Twice a week  . Attends Religious Services: More than 4 times per year  . Active Member of Clubs or Organizations: No  . Attends Archivist Meetings: Never  . Marital Status: Married  Human resources officer Violence: Not At Risk  . Fear  of Current or Ex-Partner: No  . Emotionally Abused: No  . Physically Abused: No  . Sexually Abused: No    FAMILY HISTORY:  Family History  Problem Relation Age of Onset  . Breast cancer Mother   . Prostate cancer Father   . COPD Sister     CURRENT MEDICATIONS:  Current Outpatient Medications  Medication Sig Dispense Refill  . amiodarone (PACERONE) 200 MG tablet Take 200 mg by mouth daily.    Marland Kitchen ELIQUIS 5 MG TABS tablet TAKE ONE TABLET BY MOUTH 2 TIMES A DAY. 60 tablet 6  . furosemide (LASIX) 40 MG tablet $RemoveB'40mg'ifPIiFxe$  in AM, $Remo'20mg'gZbQM$  at 4 pm. 45  tablet 1  . hydrALAZINE (APRESOLINE) 50 MG tablet Take 1 tablet (50 mg total) by mouth 3 (three) times daily. 90 tablet 6  . isosorbide mononitrate (IMDUR) 30 MG 24 hr tablet Take 0.5 tablets (15 mg total) by mouth daily. 30 tablet 1  . metoprolol succinate (TOPROL XL) 25 MG 24 hr tablet Take 1 tablet (25 mg total) by mouth daily.    Marland Kitchen omeprazole (PRILOSEC) 20 MG capsule     . potassium chloride SA (KLOR-CON) 20 MEQ tablet Take 1 tablet (20 mEq total) by mouth daily. 90 tablet 3  . VENTOLIN HFA 108 (90 Base) MCG/ACT inhaler Inhale 2 puffs into the lungs every 6 (six) hours as needed for wheezing or shortness of breath. 18 g 3  . acetaminophen (TYLENOL) 500 MG tablet Take 1,000 mg by mouth every 6 (six) hours as needed for moderate pain or headache. (Patient not taking: Reported on 09/30/2020)    . cetirizine (ZYRTEC) 10 MG tablet Take 1 tablet (10 mg total) by mouth daily as needed for allergies. (Patient not taking: Reported on 09/30/2020) 90 tablet 2   No current facility-administered medications for this visit.    ALLERGIES:  Allergies  Allergen Reactions  . Augmentin [Amoxicillin-Pot Clavulanate] Other (See Comments)    Increased liver enzymes Has patient had a PCN reaction causing immediate rash, facial/tongue/throat swelling, SOB or lightheadedness with hypotension: No Has patient had a PCN reaction causing severe rash involving mucus membranes or  skin necrosis: No Has patient had a PCN reaction that required hospitalization: No Has patient had a PCN reaction occurring within the last 10 years: No  If all of the above answers are "NO", then may proceed with Cephalosporin use.   Marland Kitchen Flomax [Tamsulosin Hcl] Nausea Only    Dizziness  Pt is on flomax    PHYSICAL EXAM:  Performance status (ECOG): 0 - Asymptomatic  Vitals:   09/30/20 1525  BP: (!) 153/83  Pulse: (!) 48  Resp: 18  Temp: (!) 97.1 F (36.2 C)  SpO2: 98%   Wt Readings from Last 3 Encounters:  09/30/20 177 lb (80.3 kg)  09/29/20 177 lb (80.3 kg)  09/25/20 181 lb 12.8 oz (82.5 kg)   Physical Exam Vitals reviewed.  Constitutional:      Appearance: Normal appearance.  Cardiovascular:     Rate and Rhythm: Normal rate. Rhythm irregular.     Pulses: Normal pulses.     Heart sounds: Normal heart sounds.  Pulmonary:     Effort: Pulmonary effort is normal.     Breath sounds: Normal breath sounds.  Abdominal:     Palpations: Abdomen is soft. There is no hepatomegaly, splenomegaly or mass.     Tenderness: There is no abdominal tenderness.     Hernia: No hernia is present.  Neurological:     General: No focal deficit present.     Mental Status: He is alert and oriented to person, place, and time.  Psychiatric:        Mood and Affect: Mood normal.        Behavior: Behavior normal.     LABORATORY DATA:  I have reviewed the labs as listed.  CBC Latest Ref Rng & Units 09/23/2020 09/10/2020 08/27/2020  WBC 4.0 - 10.5 K/uL 7.7 - 6.3  Hemoglobin 13.0 - 17.0 g/dL 13.9 16.0 14.5  Hematocrit 39.0 - 52.0 % 45.5 47.0 46.3  Platelets 150 - 400 K/uL 276 - 254   CMP Latest Ref Rng & Units 09/23/2020 09/10/2020 08/25/2020  Glucose 70 - 99 mg/dL 104(H) 101(H) 117(H)  BUN 8 - 23 mg/dL $Remove'16 18 16  'AbCGrim$ Creatinine 0.61 - 1.24 mg/dL 1.40(H) 1.30(H) 1.30(H)  Sodium 135 - 145 mmol/L 139 141 135  Potassium 3.5 - 5.1 mmol/L 3.9 3.7 3.6  Chloride 98 - 111 mmol/L 103 105 102  CO2 22 - 32  mmol/L 27 - 24  Calcium 8.9 - 10.3 mg/dL 8.9 - 8.5(L)  Total Protein 6.5 - 8.1 g/dL 7.3 - -  Total Bilirubin 0.3 - 1.2 mg/dL 0.8 - -  Alkaline Phos 38 - 126 U/L 64 - -  AST 15 - 41 U/L 16 - -  ALT 0 - 44 U/L 18 - -      Component Value Date/Time   RBC 4.95 09/23/2020 1429   MCV 91.9 09/23/2020 1429   MCV 89 07/31/2020 1131   MCH 28.1 09/23/2020 1429   MCHC 30.5 09/23/2020 1429   RDW 16.2 (H) 09/23/2020 1429   RDW 14.6 07/31/2020 1131   LYMPHSABS 1.9 09/23/2020 1429   LYMPHSABS 1.6 04/09/2020 1111   MONOABS 0.8 09/23/2020 1429   EOSABS 0.2 09/23/2020 1429   EOSABS 0.3 04/09/2020 1111   BASOSABS 0.1 09/23/2020 1429   BASOSABS 0.1 04/09/2020 1111   Lab Results  Component Value Date   LDH 123 09/23/2020   LDH 116 06/11/2020    DIAGNOSTIC IMAGING:  I have independently reviewed the scans and discussed with the patient. No results found.   ASSESSMENT:  1. 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 Diona Fanti. -He had an abdominal CT on 04/25/2020 for work-up of kidney stone. This showed T11 lesion. -MRI of the thoracic spine on 05/23/2020 showed metastatic lesions throughout the thoracic spine, most advanced involvement on the right at T11 with extraosseous tumor encroaching upon the intervertebral foramina on the right to compress the right T11 nerve. -MRI of the abdomen with and without contrast on 06/05/2020 showed no evidence of intra-abdominal mass or lymphadenopathy. No liver lesions noted. 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 -Left axillary lymph node biopsy on 06/05/2020 consistent with follicular lymphoma, morphologically low-grade (grade 1-2) is favored although focally elevated Ki-67 can sometimes indicate more aggressive behavior. IHC positive for CD20, CD79a, CD10, BCL6 and BCL2. Ki-67 is overall low, but does have foci of increased proliferation. -PET CT scan on 06/23/2020 showed multiple small  hypermetabolic lymph nodes in the neck, maximum SUV 11.4.  Left axillary adenopathy 4.7 x 4.6 cm with SUV 16.8.  Few subcentimeter lymph nodes in the small bowel mesentery with SUV 4.7.  Mild hypermetabolic lymphadenopathy in both inguinal regions with SUV 8.8.  Multiple hypermetabolic bone lesions throughout the spine and pelvis involving both hips.  2. Social/family history: -He never smoked. He worked as a Furniture conservator/restorer. No exposure to chemicals. -Father died of prostate cancer and mother had breast cancer. 2 paternal aunts had breast cancer and one paternal uncle had lung cancer.   PLAN:  1.  Stage IVa follicular lymphoma: -He does not report any fevers, night sweats.  He has weight loss a few pounds. - He is also newly diagnosed with the congestive heart failure and is on diuretics. - Physical examination did not reveal any major adenopathy.  Small lymph nodes are stable.  No splenomegaly. - Reviewed labs from September 23, 2020 which showed normal LDH.  No cytopenias noted. - No indication for treatment at this time. - RTC 4 months with labs.  Orders  placed this encounter:  No orders of the defined types were placed in this encounter.    Derek Jack, MD [place] [phone]   I, Milinda Antis, am acting as a scribe for Dr. Sanda Linger.  I, Derek Jack MD, have reviewed the above documentation for accuracy and completeness, and I agree with the above.

## 2020-09-30 NOTE — Patient Instructions (Signed)
[facility] at [facility] °Discharge Instructions ° °You were seen today by Dr. Katragadda. He went over your recent results. Dr. Katragadda will see you back in 4 months for labs and follow up. ° ° °Thank you for choosing [facility] at [facility] to provide your oncology and hematology care.  To afford each patient quality time with our provider, please arrive at least 15 minutes before your scheduled appointment time.  ° °If you have a lab appointment with the Cancer Center please come in thru the Main Entrance and check in at the main information desk ° °You need to re-schedule your appointment should you arrive 10 or more minutes late.  We strive to give you quality time with our providers, and arriving late affects you and other patients whose appointments are after yours.  Also, if you no show three or more times for appointments you may be dismissed from the clinic at the providers discretion.     °Again, thank you for choosing [facility].  Our hope is that these requests will decrease the amount of time that you wait before being seen by our physicians.       °_____________________________________________________________ ° °Should you have questions after your visit to [facility], please contact our office at [phone] between the hours of 8:00 a.m. and 4:30 p.m.  Voicemails left after 4:00 p.m. will not be returned until the following business day.  For prescription refill requests, have your pharmacy contact our office and allow 72 hours.   ° °Cancer Center Support Programs:  ° °> Cancer Support Group  °2nd Tuesday of the month 1pm-2pm, Journey Room  ° ° °

## 2020-10-04 DIAGNOSIS — N179 Acute kidney failure, unspecified: Secondary | ICD-10-CM | POA: Diagnosis not present

## 2020-10-04 DIAGNOSIS — J9601 Acute respiratory failure with hypoxia: Secondary | ICD-10-CM | POA: Diagnosis not present

## 2020-10-04 DIAGNOSIS — R7401 Elevation of levels of liver transaminase levels: Secondary | ICD-10-CM | POA: Diagnosis not present

## 2020-10-09 DIAGNOSIS — H5203 Hypermetropia, bilateral: Secondary | ICD-10-CM | POA: Diagnosis not present

## 2020-10-15 ENCOUNTER — Ambulatory Visit: Payer: Medicare Other | Admitting: Cardiology

## 2020-10-16 ENCOUNTER — Other Ambulatory Visit: Payer: Self-pay | Admitting: Nurse Practitioner

## 2020-10-17 ENCOUNTER — Telehealth: Payer: Self-pay | Admitting: *Deleted

## 2020-10-17 NOTE — Telephone Encounter (Signed)
Patient wife [name]) notified and verbalized understanding.

## 2020-10-17 NOTE — Telephone Encounter (Signed)
-----   Message from Verta Ellen., NP sent at 10/16/2020  8:54 AM EST ----- Regarding: Blood pressure log I reviewed his recent log of blood pressures.  It appears his blood pressures are improving since increasing the hydralazine.  Just tell him to make sure he takes his blood pressure medication and weights for approximately 2 hours before checking his blood pressure to make sure the medication is in his system before he checks his blood pressures.  This may give him more accurate results.  Tell him to continue to monitor blood pressures 2-3 times per week and call if consistently blood pressure stays above 130/80.  Thank you

## 2020-10-24 ENCOUNTER — Telehealth: Payer: Self-pay | Admitting: *Deleted

## 2020-10-24 MED ORDER — HYDRALAZINE HCL 50 MG PO TABS: 75.0000 mg | ORAL_TABLET | Freq: Three times a day (TID) | ORAL | 3 refills | Status: DC

## 2020-10-24 NOTE — Telephone Encounter (Signed)
Called to notify pt of medication changes. No answer. Left msg to call back.

## 2020-10-24 NOTE — Telephone Encounter (Signed)
-----   Message from Arnoldo Lenis, MD sent at 10/24/2020 10:25 AM EST ----- Saw his wife todayin clinic, reported he has been having high bp's. Can we increase his hydralazine to 75mg  tid, update [place] on bp's late next week  Zandra Abts MD

## 2020-10-24 NOTE — Telephone Encounter (Signed)
Per Levada Dy at [facility], home oxygen therapy was ordered during d/c from hospital by Hospitalist. Patient reports that he no longer needs oxygen and is not using home oxygen. Also reports that oxygen is very costly and would like an order from Dr. Harl Bowie to d/c oxygen therapy. Please advise. Please fax order to [facility]. Phone number provided [phone] but fax number not available.

## 2020-10-27 ENCOUNTER — Encounter: Payer: Self-pay | Admitting: Family Medicine

## 2020-10-27 NOTE — Telephone Encounter (Signed)
Ok to d/c oxygen  Zandra Abts MD

## 2020-10-27 NOTE — Telephone Encounter (Signed)
Will fax to [facility]  Fax number [phone]

## 2020-10-27 NOTE — Telephone Encounter (Signed)
Pt made an appt with Donneta Romberg to discharge from o2

## 2020-10-28 ENCOUNTER — Ambulatory Visit: Payer: Medicare Other | Admitting: Nurse Practitioner

## 2020-11-02 ENCOUNTER — Emergency Department (HOSPITAL_COMMUNITY)
Admission: EM | Admit: 2020-11-02 | Discharge: 2020-11-02 | Disposition: A | Discharge status: Home / Self Care | Payer: Medicare Other | Attending: Emergency Medicine | Admitting: Emergency Medicine

## 2020-11-02 ENCOUNTER — Encounter (HOSPITAL_COMMUNITY): Payer: Self-pay | Admitting: Emergency Medicine

## 2020-11-02 ENCOUNTER — Emergency Department (HOSPITAL_COMMUNITY): Payer: Medicare Other

## 2020-11-02 ENCOUNTER — Other Ambulatory Visit: Payer: Self-pay

## 2020-11-02 DIAGNOSIS — N1832 Chronic kidney disease, stage 3b: Secondary | ICD-10-CM | POA: Insufficient documentation

## 2020-11-02 DIAGNOSIS — I130 Hypertensive heart and chronic kidney disease with heart failure and stage 1 through stage 4 chronic kidney disease, or unspecified chronic kidney disease: Secondary | ICD-10-CM | POA: Diagnosis not present

## 2020-11-02 DIAGNOSIS — I10 Essential (primary) hypertension: Secondary | ICD-10-CM | POA: Diagnosis not present

## 2020-11-02 DIAGNOSIS — I5022 Chronic systolic (congestive) heart failure: Secondary | ICD-10-CM | POA: Diagnosis not present

## 2020-11-02 DIAGNOSIS — Z85828 Personal history of other malignant neoplasm of skin: Secondary | ICD-10-CM | POA: Insufficient documentation

## 2020-11-02 DIAGNOSIS — R519 Headache, unspecified: Secondary | ICD-10-CM

## 2020-11-02 DIAGNOSIS — Z20822 Contact with and (suspected) exposure to covid-19: Secondary | ICD-10-CM | POA: Insufficient documentation

## 2020-11-02 DIAGNOSIS — Z7901 Long term (current) use of anticoagulants: Secondary | ICD-10-CM | POA: Diagnosis not present

## 2020-11-02 DIAGNOSIS — Z79899 Other long term (current) drug therapy: Secondary | ICD-10-CM | POA: Insufficient documentation

## 2020-11-02 LAB — BASIC METABOLIC PANEL
Anion gap: 8 (ref 5–15)
BUN: 17 mg/dL (ref 8–23)
CO2: 24 mmol/L (ref 22–32)
Calcium: 8.6 mg/dL — ABNORMAL LOW (ref 8.9–10.3)
Chloride: 107 mmol/L (ref 98–111)
Creatinine, Ser: 1.37 mg/dL — ABNORMAL HIGH (ref 0.61–1.24)
GFR, Estimated: 54 mL/min — ABNORMAL LOW (ref >60)
Glucose, Bld: 86 mg/dL (ref 70–99)
Potassium: 3.7 mmol/L (ref 3.5–5.1)
Sodium: 139 mmol/L (ref 135–145)

## 2020-11-02 LAB — CBC WITH DIFFERENTIAL/PLATELET
Abs Immature Granulocytes: 0.04 10*3/uL (ref 0.00–0.07)
Basophils Absolute: 0.1 10*3/uL (ref 0.0–0.1)
Basophils Relative: 1 %
Eosinophils Absolute: 0.3 10*3/uL (ref 0.0–0.5)
Eosinophils Relative: 4 %
HCT: 43 % (ref 39.0–52.0)
Hemoglobin: 13.6 g/dL (ref 13.0–17.0)
Immature Granulocytes: 1 %
Lymphocytes Relative: 25 %
Lymphs Abs: 1.8 10*3/uL (ref 0.7–4.0)
MCH: 28.8 pg (ref 26.0–34.0)
MCHC: 31.6 g/dL (ref 30.0–36.0)
MCV: 91.1 fL (ref 80.0–100.0)
Monocytes Absolute: 0.9 10*3/uL (ref 0.1–1.0)
Monocytes Relative: 13 %
Neutro Abs: 4 10*3/uL (ref 1.7–7.7)
Neutrophils Relative %: 56 %
Platelets: 214 10*3/uL (ref 150–400)
RBC: 4.72 MIL/uL (ref 4.22–5.81)
RDW: 16 % — ABNORMAL HIGH (ref 11.5–15.5)
WBC: 7 10*3/uL (ref 4.0–10.5)
nRBC: 0 % (ref 0.0–0.2)

## 2020-11-02 MED ORDER — HYDRALAZINE HCL 25 MG PO TABS
75.0000 mg | ORAL_TABLET | Freq: Once | ORAL | Status: AC
  Administered 2020-11-02: 75 mg via ORAL
  Filled 2020-11-02: qty 3

## 2020-11-02 MED ORDER — METOPROLOL SUCCINATE ER 25 MG PO TB24
25.0000 mg | ORAL_TABLET | Freq: Every day | ORAL | Status: DC
  Administered 2020-11-02: 25 mg via ORAL
  Filled 2020-11-02: qty 1

## 2020-11-02 MED ORDER — ACETAMINOPHEN 325 MG PO TABS
650.0000 mg | ORAL_TABLET | Freq: Once | ORAL | Status: AC
  Administered 2020-11-02: 650 mg via ORAL
  Filled 2020-11-02: qty 2

## 2020-11-02 NOTE — Discharge Instructions (Addendum)
Your head CT did not show any evidence of a stroke. Your blood pressure showed some improvement after taking your home medications.   You will need to call Dr. Nelly Laurence office tomorrow about your elevated blood pressure.   Please return to the emergency department for any new or worsening symptoms.

## 2020-11-02 NOTE — ED Triage Notes (Signed)
Pt to the Ed with c/o HTN with a HA for the past 2 days. Pt has an appt with Dr. Harl Bowie this Wednesday.

## 2020-11-02 NOTE — ED Provider Notes (Signed)
[facility] EMERGENCY DEPARTMENT Provider Note   CSN: [id number] Arrival date & time: 11/02/20  1809     History Chief Complaint  Patient presents with  . Hypertension    Darius Smith is a 75 y.o. male.  HPI   75 year old male with a history of hypertension, GERD, A. fib, CHF, follicular lymphoma, CKD, transaminitis, who presents to the emergency department today for evaluation of hypertension and headache. States he has had intermittent headache for the last 3 days. Currently rates pain 4-5/10. He took tylenol which improved symptoms. Pain located to the left side of his head.   States he took his BP and it was elevated at 198/102. Per chart review, he has been having elevated Bps and recently had his medications increased. He has an appt with Dr. Harl Bowie in 3 days.   Denies associated dizziness, lightheadedness, vision changes, denies and unilateral weakness/numbness, speech difficulty, ataxia. Denies chest pain or shortness of breath.  Past Medical History:  Diagnosis Date  . Allergic rhinitis 07/02/2019  . Arthritis    both hands  . Atrial fibrillation ([place]) 10/28/2017   Dr. Harl Bowie  . Atrial fibrillation with RVR ([place]) 07/14/2020  . Blood in urine    occ  . Bronchitis   . Cancer (HCC)    Basal cell  . Cardiomegaly 10/28/2017  . Complication of anesthesia    Mr. Longman developed a. fib in recovery after cysto procedure 10/2017 was transferred to [facility] and underwent successful cardioversion  . Diverticulosis of sigmoid colon 08/2017   Noted on colonoscopy  . Dysrhythmia   . External hemorrhoids 08/2017  . GERD (gastroesophageal reflux disease)   . History of colon polyps 08/2017  . History of kidney stones   . History of rectal bleeding   . History of right inguinal hernia   . Hypertension   . Lymphoma ([place])   . Nephrolithiasis 07/02/2019  . Rectal bleeding 09/02/2017   Added automatically from request for surgery [id number]  . Shortness of breath     Patient  Active Problem List   Diagnosis Date Noted  . Secondary hypercoagulable state ([place]) 08/27/2020  . Goals of care, counseling/discussion   . Palliative care by specialist   . DNR (do not resuscitate) discussion   . Drug-induced insomnia ([place]) 08/01/2020  . Transaminitis 08/01/2020  . Encounter for support and coordination of transition of care 07/31/2020  . 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 Annual visit for general adult medical examination with abnormal findings 07/30/2020  . Chronic systolic HF (heart failure) ([place]) 07/14/2020  . 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 Atrial fibrillation ([place]) 03/20/2020  . Essential hypertension 08/08/2014  . GERD (gastroesophageal reflux disease) 08/08/2014    Past Surgical History:  Procedure Laterality Date  . CARDIOVERSION     10/2017  . CARDIOVERSION N/A 07/16/2020   Procedure: CARDIOVERSION;  Surgeon: Arnoldo Lenis, MD;  [facility];  Service: Endoscopy;  Laterality: N/A;  . CARDIOVERSION N/A 08/21/2020   Procedure: CARDIOVERSION;  Surgeon: Satira Sark, MD;  Location: [facility] ORS;  Service: Cardiovascular;  Laterality: N/A;  . CARDIOVERSION N/A 09/10/2020   Procedure: CARDIOVERSION;  Surgeon: Josue Hector, MD;  Location: [facility] ENDOSCOPY;  Service: Cardiovascular;  Laterality: N/A;  . CHOLECYSTECTOMY    . 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 CLEFT PALATE REPAIR     several from childhood til 75 years old  . COLONOSCOPY  04/28/2011  Procedure: COLONOSCOPY;  Surgeon: Rogene Houston, MD;  Location: [facility];  Service: Endoscopy;  Laterality: N/A;  . COLONOSCOPY N/A 07/18/2014   Procedure: COLONOSCOPY;  Surgeon: Rogene Houston, MD;  Location: [facility];  Service: Endoscopy;  Laterality: N/A;  830  . COLONOSCOPY N/A 09/09/2017   Procedure: COLONOSCOPY;  Surgeon: Rogene Houston, MD;  [facility] END[facility];  Service: Endoscopy;  Laterality: N/A;  955  .  CYSTOSCOPY/URETEROSCOPY/HOLMIUM LASER/STENT PLACEMENT Bilateral 12/06/2017   Procedure: CYSTOSCOPY/RETROGRADE/URETEROSCOPY/HOLMIUM LASER/STENT EXCHANGE;  Surgeon: Festus Aloe, MD;  Location: [facility] ORS;  Service: Urology;  Laterality: Bilateral;  ONLY NEEDS 60 MIN  . HERNIA REPAIR    . POLYPECTOMY  09/09/2017   Procedure: POLYPECTOMY;  Surgeon: Rogene Houston, MD;  Location[facility];  Service: Endoscopy;;  colon  . vocal cord surgery         Family History  Problem Relation Age of Onset  . Breast cancer Mother   . Prostate cancer Father   . COPD Sister     Social History   Tobacco Use  . Smoking status: Never Smoker  . Smokeless tobacco: Never Used  Vaping Use  . Vaping Use: Never used  Substance Use Topics  . Alcohol use: Not Currently    Alcohol/week: 7.0 standard drinks    Types: 7 Cans of beer per week  . Drug use: No    Home Medications Prior to Admission medications   Medication Sig Start Date End Date Taking? Authorizing Provider  acetaminophen (TYLENOL) 500 MG tablet Take 1,000 mg by mouth every 6 (six) hours as needed for moderate pain or headache. Patient not taking: Reported on 09/30/2020    [provider]  amiodarone (PACERONE) 200 MG tablet Take 200 mg by mouth daily.    [provider]  cetirizine (ZYRTEC) 10 MG tablet Take 1 tablet (10 mg total) by mouth daily as needed for allergies. Patient not taking: Reported on 09/30/2020 01/01/20   Maryruth Hancock, MD  ELIQUIS 5 MG TABS tablet TAKE ONE TABLET BY MOUTH 2 TIMES A DAY. 04/21/20   Arnoldo Lenis, MD  furosemide (LASIX) 40 MG tablet 40mg  in AM, 20mg  at 4 pm. 08/05/20   Samuella Cota, MD  hydrALAZINE (APRESOLINE) 50 MG tablet Take 1.5 tablets (75 mg total) by mouth 3 (three) times daily. 10/24/20 01/22/21  Arnoldo Lenis, MD  isosorbide mononitrate (IMDUR) 30 MG 24 hr tablet Take 0.5 tablets (15 mg total) by mouth daily. 07/19/20   Barton Dubois, MD  metoprolol succinate (TOPROL  XL) 25 MG 24 hr tablet Take 1 tablet (25 mg total) by mouth daily. 09/29/20   Verta Ellen., NP  omeprazole (PRILOSEC) 20 MG capsule  01/01/20   [provider]  potassium chloride SA (KLOR-CON) 20 MEQ tablet Take 1 tablet (20 mEq total) by mouth daily. 08/13/20   Imogene Burn, PA-C  VENTOLIN HFA 108 (90 Base) MCG/ACT inhaler Inhale 2 puffs into the lungs every 6 (six) hours as needed for wheezing or shortness of breath. 07/11/20   Fayrene Helper, MD    Allergies    Augmentin [amoxicillin-pot clavulanate] and Flomax [tamsulosin hcl]  Review of Systems   Review of Systems  Constitutional: Negative for fever.  HENT: Negative for ear pain and sore throat.   Eyes: Negative for visual disturbance.  Respiratory: Negative for cough and shortness of breath.   Cardiovascular: Negative for chest pain.  Gastrointestinal: Negative for abdominal pain, constipation,  diarrhea, nausea and vomiting.  Genitourinary: Negative for dysuria and hematuria.  Musculoskeletal: Negative for back pain.  Skin: Negative for rash.  Neurological: Positive for headaches. Negative for dizziness, weakness, light-headedness and numbness.  All other systems reviewed and are negative.   Physical Exam Updated Vital Signs BP (!) 176/98   Pulse 63   Temp 98.5 F (36.9 C) (Oral)   Resp 19   Ht 6' (1.829 m)   Wt 81.6 kg   SpO2 96%   BMI 24.41 kg/m   Physical Exam Vitals and nursing note reviewed.  Constitutional:      Appearance: He is well-developed and well-nourished.  HENT:     Head: Normocephalic and atraumatic.  Eyes:     Conjunctiva/sclera: Conjunctivae normal.  Cardiovascular:     Rate and Rhythm: Normal rate and regular rhythm.     Heart sounds: No murmur heard.   Pulmonary:     Effort: Pulmonary effort is normal. No respiratory distress.     Breath sounds: Normal breath sounds.  Abdominal:     Palpations: Abdomen is soft.     Tenderness: There is no abdominal tenderness.   Musculoskeletal:        General: No edema.     Cervical back: Neck supple.  Skin:    General: Skin is warm and dry.  Neurological:     Mental Status: He is alert.     Comments: Mental Status:  Alert, thought content appropriate, able to give a coherent history. Speech fluent without evidence of aphasia. Able to follow 2 step commands without difficulty.  Cranial Nerves:  II: pupils equal, round, reactive to light III,IV, VI: ptosis not present, extra-ocular motions intact bilaterally  V,VII: smile symmetric, facial light touch sensation equal VIII: hearing grossly normal to voice  X: uvula elevates symmetrically  XI: bilateral shoulder shrug symmetric and strong XII: midline tongue extension without fassiculations Motor:  Normal tone. 5/5 strength of BUE and BLE major muscle groups including strong and equal grip strength and dorsiflexion/plantar flexion Sensory: light touch normal in all extremities. Cerebellar: normal finger-to-nose with bilateral upper extremities Gait: normal gait and balance.   Psychiatric:        Mood and Affect: Mood and affect normal.     ED Results / Procedures / Treatments   Labs (all labs ordered are listed, but only abnormal results are displayed) Labs Reviewed  CBC WITH DIFFERENTIAL/PLATELET - Abnormal; Notable for the following components:      Result Value   RDW 16.0 (*)    All other components within normal limits  BASIC METABOLIC PANEL - Abnormal; Notable for the following components:   Creatinine, Ser 1.37 (*)    Calcium 8.6 (*)    GFR, Estimated 54 (*)    All other components within normal limits    EKG None  Radiology CT Head Wo Contrast  Result Date: 11/02/2020 CLINICAL DATA:  Hypertension and headache for the past 2 days. EXAM: CT HEAD WITHOUT CONTRAST TECHNIQUE: Contiguous axial images were obtained from the base of the skull through the vertex without intravenous contrast. COMPARISON:  09/22/2018 FINDINGS: Brain: No evidence  of acute infarction, hemorrhage, hydrocephalus, extra-axial collection or mass lesion/mass effect. Patchy areas of predominantly subcortical white matter hypoattenuation noted consistent with mild chronic microvascular ischemic change. Vascular: No hyperdense vessel or unexpected calcification. Skull: Normal. Negative for fracture or focal lesion. Sinuses/Orbits: Globes and orbits are unremarkable. There is mild nodular mucosal thickening in the maxillary and ethmoid air cells with minor  mucosal thickening along the anterior left sphenoid sinus. Other: None. IMPRESSION: 1. No acute intracranial abnormalities. 2. Mild chronic microvascular ischemic change. 3. Mild sinus mucosal thickening as detailed. Electronically Signed   By: Lajean Manes M.D.   On: 11/02/2020 19:50    Procedures Procedures   Medications Ordered in ED Medications  hydrALAZINE (APRESOLINE) tablet 75 mg (75 mg Oral Given 11/02/20 2004)  acetaminophen (TYLENOL) tablet 650 mg (650 mg Oral Given 11/02/20 2147)    ED Course  I have reviewed the triage vital signs and the nursing notes.  Pertinent labs & imaging results that were available during my care of the patient were reviewed by me and considered in my medical decision making (see chart for details).    MDM Rules/Calculators/A&P                          75 year old male presenting the emergency department today for evaluation of hypertension.  Has had uncontrolled hypertension recently and blood pressure medications have been being titrated by his cardiologist.  Today he is concerned because he has been having a headache.  Neurologic exam is within normal limits here in the ED today.  CT scan of the head reviewed/interpreted and did not show any evidence of bleeding or acute stroke.  His labs are reassuring.  He does have abnormal kidney function but this appears to be at his baseline.  He was given his home medications here in the ED and his blood pressure did improve somewhat  while he is here.  He has no evidence of hypertensive emergency at this time and feel that he is appropriate to follow-up with his cardiologist for further titration of blood pressure medications.  I advised that he call the office tomorrow for recommendations.  He does have an appointment for follow-up in 3 days.  Advised on specific return precautions.  He voices understanding of the plan and reasons to return.  All questions answered.  Patient stable for discharge.  Final Clinical Impression(s) / ED Diagnoses Final diagnoses:  Hypertension, unspecified type  Acute nonintractable headache, unspecified headache type    Rx / DC Orders ED Discharge Orders    None       Bishop Dublin 11/02/20 2154    Davonna Belling, MD 11/02/20 2243

## 2020-11-03 LAB — SARS CORONAVIRUS 2 (TAT 6-24 HRS): SARS Coronavirus 2: NEGATIVE

## 2020-11-05 ENCOUNTER — Encounter: Payer: Self-pay | Admitting: Cardiology

## 2020-11-05 ENCOUNTER — Ambulatory Visit: Payer: Medicare Other | Admitting: Cardiology

## 2020-11-05 VITALS — BP 154/100 | HR 56 | Ht 72.0 in | Wt 183.8 lb

## 2020-11-05 DIAGNOSIS — I5022 Chronic systolic (congestive) heart failure: Secondary | ICD-10-CM | POA: Diagnosis not present

## 2020-11-05 DIAGNOSIS — I10 Essential (primary) hypertension: Secondary | ICD-10-CM | POA: Diagnosis not present

## 2020-11-05 DIAGNOSIS — I4891 Unspecified atrial fibrillation: Secondary | ICD-10-CM | POA: Diagnosis not present

## 2020-11-05 MED ORDER — HYDRALAZINE HCL 100 MG PO TABS: 100.0000 mg | ORAL_TABLET | Freq: Three times a day (TID) | ORAL | 3 refills | Status: DC

## 2020-11-05 MED ORDER — SPIRONOLACTONE 25 MG PO TABS: 25.0000 mg | ORAL_TABLET | Freq: Every day | ORAL | 1 refills | Status: DC

## 2020-11-05 NOTE — Patient Instructions (Addendum)
Your physician recommends that you schedule a follow-up appointment in: [place] has recommended you make the following change in your medication:   INCREASE HYDRALAZINE 100 MG THREE TIMES DAILY   START ALDACTONE 25 MG DAILY   Your physician recommends that you return for lab work 1 WEEK BMP/MG  Thank you for choosing [facility]!!

## 2020-11-05 NOTE — Progress Notes (Signed)
Clinical Summary Darius Smith is a 75 y.o.male  seen today for follow up of the following medical problems.  1. Chronic systolic HF - 05/6758 echo LVEF 20-25%. New diagnosis of systolic HF at that time.  - recent admission 06/2020 with volume overload.BNP  2000s, CXR with edema. - diuresis was limited by elevation in Cr -with elevation in Cr we stopped his entresto, started hydral/imdur.  - systolic dysfunction thought to be tachy mediated as significant issues with afib with RVR at the same time.  - have not pursued ischemic testing as of yet, repeat echo few months after afib controlled and pending renal function   - walking 2.5 to 4 miles daily, tolerating without to - home weights stable 174-176 lbs   2. Persistent afib - dynamaps do not accuratelty measure his HRs, best measured by EKG> - issues with elevated HRs recently including during recent admission -difficulty controlling with beta blocker alone, avoiding CCB due to systolic dysfunction, renal function and HF limit antiarrhythmic options. During admission we started amiodarone - failed attempt at [place] 07/16/20, plan to continue oral load and retry in 3 weeks.  - on amio, DCCVs on 12/2 and 09/10/20, last 09/10/20 conversion was succesful. He as in sinus brady at afib clinic f/u 12/29 - was to lower toprol to 100mg  after last EP visit due to low HRs   11/02/20 EKG in ER SR 60s - no recent symptoms  3. Stage IV lymphoma - from onc notes just monitoring at this time, in absence of symptoms no indciation for therapy. - I did hear back from oncology, they report his lymphoma is not curable and treatment only helps symptoms. Prognosis can be a few years to a few decades, and they do not see a contraindciation to proceeding with cath in the future.  4. HTN - ER visit with high bps, SBPs 180s - compliant with meds - home bps SBPs variabel 140s-160s.    Wife is former Set designer at  [employer]. Very good at keeping track of his weights, vitals etc.   Past Medical History:  Diagnosis Date  . Allergic rhinitis 07/02/2019  . Arthritis    both hands  . Atrial fibrillation ([place]) 10/28/2017   Dr. Harl Bowie  . Atrial fibrillation with RVR ([place]) 07/14/2020  . Blood in urine    occ  . Bronchitis   . Cancer (HCC)    Basal cell  . Cardiomegaly 10/28/2017  . Complication of anesthesia    Darius Smith developed a. fib in recovery after cysto procedure 10/2017 was transferred to [facility] and underwent successful cardioversion  . Diverticulosis of sigmoid colon 08/2017   Noted on colonoscopy  . Dysrhythmia   . External hemorrhoids 08/2017  . GERD (gastroesophageal reflux disease)   . History of colon polyps 08/2017  . History of kidney stones   . History of rectal bleeding   . History of right inguinal hernia   . Hypertension   . Lymphoma ([place])   . Nephrolithiasis 07/02/2019  . Rectal bleeding 09/02/2017   Added automatically from request for surgery [id number]  . Shortness of breath      Allergies  Allergen Reactions  . Augmentin [Amoxicillin-Pot Clavulanate] Other (See Comments)    Increased liver enzymes Has patient had a PCN reaction causing immediate rash, facial/tongue/throat swelling, SOB or lightheadedness with hypotension: No Has patient had a PCN reaction causing severe rash involving mucus membranes or skin necrosis: No Has patient had a PCN  reaction that required hospitalization: No Has patient had a PCN reaction occurring within the last 10 years: No  If all of the above answers are "NO", then may proceed with Cephalosporin use.   Marland Kitchen Flomax [Tamsulosin Hcl] Nausea Only    Dizziness  Pt is on flomax     Current Outpatient Medications  Medication Sig Dispense Refill  . acetaminophen (TYLENOL) 500 MG tablet Take 1,000 mg by mouth every 6 (six) hours as needed for moderate pain or headache. (Patient not taking: Reported on 09/30/2020)    . amiodarone  (PACERONE) 200 MG tablet Take 200 mg by mouth daily.    . cetirizine (ZYRTEC) 10 MG tablet Take 1 tablet (10 mg total) by mouth daily as needed for allergies. (Patient not taking: Reported on 09/30/2020) 90 tablet 2  . ELIQUIS 5 MG TABS tablet TAKE ONE TABLET BY MOUTH 2 TIMES A DAY. 60 tablet 6  . furosemide (LASIX) 40 MG tablet 40mg  in AM, 20mg  at 4 pm. 45 tablet 1  . hydrALAZINE (APRESOLINE) 50 MG tablet Take 1.5 tablets (75 mg total) by mouth 3 (three) times daily. 405 tablet 3  . isosorbide mononitrate (IMDUR) 30 MG 24 hr tablet Take 0.5 tablets (15 mg total) by mouth daily. 30 tablet 1  . metoprolol succinate (TOPROL XL) 25 MG 24 hr tablet Take 1 tablet (25 mg total) by mouth daily.    Marland Kitchen omeprazole (PRILOSEC) 20 MG capsule     . potassium chloride SA (KLOR-CON) 20 MEQ tablet Take 1 tablet (20 mEq total) by mouth daily. 90 tablet 3  . VENTOLIN HFA 108 (90 Base) MCG/ACT inhaler Inhale 2 puffs into the lungs every 6 (six) hours as needed for wheezing or shortness of breath. 18 g 3   No current facility-administered medications for this visit.     Past Surgical History:  Procedure Laterality Date  . CARDIOVERSION     10/2017  . CARDIOVERSION N/A 07/16/2020   Procedure: CARDIOVERSION;  Surgeon: Arnoldo Lenis, MD;  [facility];  Service: Endoscopy;  Laterality: N/A;  . CARDIOVERSION N/A 08/21/2020   Procedure: CARDIOVERSION;  Surgeon: Satira Sark, MD;  Location: [facility] ORS;  Service: Cardiovascular;  Laterality: N/A;  . CARDIOVERSION N/A 09/10/2020   Procedure: CARDIOVERSION;  Surgeon: Josue Hector, MD;  Location: [facility] ENDOSCOPY;  Service: Cardiovascular;  Laterality: N/A;  . CHOLECYSTECTOMY    . 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 CLEFT PALATE REPAIR     several from childhood til 75 years old  . COLONOSCOPY  04/28/2011   Procedure: COLONOSCOPY;  Surgeon: Rogene Houston, MD;  [facility];  Service: Endoscopy;  Laterality: N/A;   . COLONOSCOPY N/A 07/18/2014   Procedure: COLONOSCOPY;  Surgeon: Rogene Houston, MD;  [facility];  Service: Endoscopy;  Laterality: N/A;  830  . COLONOSCOPY N/A 09/09/2017   Procedure: COLONOSCOPY;  Surgeon: Rogene Houston, MD;  [facility];  Service: Endoscopy;  Laterality: N/A;  955  . CYSTOSCOPY/URETEROSCOPY/HOLMIUM LASER/STENT PLACEMENT Bilateral 12/06/2017   Procedure: CYSTOSCOPY/RETROGRADE/URETEROSCOPY/HOLMIUM LASER/STENT EXCHANGE;  Surgeon: Festus Aloe, MD;  [facility];  Service: Urology;  Laterality: Bilateral;  ONLY NEEDS 60 MIN  . HERNIA REPAIR    . POLYPECTOMY  09/09/2017   Procedure: POLYPECTOMY;  Surgeon: Rogene Houston, MD;  [facility];  Service: Endoscopy;;  colon  . vocal cord surgery  Allergies  Allergen Reactions  . Augmentin [Amoxicillin-Pot Clavulanate] Other (See Comments)    Increased liver enzymes Has patient had a PCN reaction causing immediate rash, facial/tongue/throat swelling, SOB or lightheadedness with hypotension: No Has patient had a PCN reaction causing severe rash involving mucus membranes or skin necrosis: No Has patient had a PCN reaction that required hospitalization: No Has patient had a PCN reaction occurring within the last 10 years: No  If all of the above answers are "NO", then may proceed with Cephalosporin use.   Marland Kitchen Flomax [Tamsulosin Hcl] Nausea Only    Dizziness  Pt is on flomax      Family History  Problem Relation Age of Onset  . Breast cancer Mother   . Prostate cancer Father   . COPD Sister      Social History Darius Smith reports that he has never smoked. He has never used smokeless tobacco. Darius Smith reports previous alcohol use of about 7.0 standard drinks of alcohol per week.   Review of Systems CONSTITUTIONAL: No weight loss, fever, chills, weakness or fatigue.  HEENT: Eyes: No visual loss, blurred vision, double vision or yellow sclerae.No hearing loss,  sneezing, congestion, runny nose or sore throat.  SKIN: No rash or itching.  CARDIOVASCULAR: per hpi RESPIRATORY: No shortness of breath, cough or sputum.  GASTROINTESTINAL: No anorexia, nausea, vomiting or diarrhea. No abdominal pain or blood.  GENITOURINARY: No burning on urination, no polyuria NEUROLOGICAL: No headache, dizziness, syncope, paralysis, ataxia, numbness or tingling in the extremities. No change in bowel or bladder control.  MUSCULOSKELETAL: No muscle, back pain, joint pain or stiffness.  LYMPHATICS: No enlarged nodes. No history of splenectomy.  PSYCHIATRIC: No history of depression or anxiety.  ENDOCRINOLOGIC: No reports of sweating, cold or heat intolerance. No polyuria or polydipsia.  Marland Kitchen   Physical Examination Today's Vitals   11/05/20 1254  BP: (!) 154/100  Pulse: (!) 56  SpO2: 97%  Weight: 183 lb 12.8 oz (83.4 kg)  Height: 6' (1.829 m)   Body mass index is 24.93 kg/m.  Gen: resting comfortably, no acute distress HEENT: no scleral icterus, pupils equal round and reactive, no palptable cervical adenopathy,  CV: RRR, no m/r/g, no jvd Resp: Clear to auscultation bilaterally GI: abdomen is soft, non-tender, non-distended, normal bowel sounds, no hepatosplenomegaly MSK: extremities are warm, no edema.  Skin: warm, no rash Neuro:  no focal deficits Psych: appropriate affect   Diagnostic Studies     Assessment and Plan  1. Chronic systolic HF - possibly tachy mediated given recent issues with afib with RVR - entresto stopped during recent admission with AKI. Cr has trended down, restart aldactone continue to hold entresto for now.  - low HRs when in SR, toprol was lowered to 25 mg previously, would not increase - repeat echo, may need cath pending renal function and echo results at that time.     2. Afib - remains in SR, continue current meds  3. HTN - high bp's, increase hydralazine to 100mg  tid and restart aldactone 25mg  daily, check bmet 1  week      Arnoldo Lenis, M.D.

## 2020-11-06 ENCOUNTER — Other Ambulatory Visit: Payer: Self-pay | Admitting: Nurse Practitioner

## 2020-11-06 ENCOUNTER — Encounter: Payer: Self-pay | Admitting: Nurse Practitioner

## 2020-11-06 ENCOUNTER — Other Ambulatory Visit: Payer: Self-pay

## 2020-11-06 DIAGNOSIS — I5022 Chronic systolic (congestive) heart failure: Secondary | ICD-10-CM | POA: Diagnosis not present

## 2020-11-06 DIAGNOSIS — Z515 Encounter for palliative care: Secondary | ICD-10-CM | POA: Diagnosis not present

## 2020-11-06 NOTE — Progress Notes (Signed)
[facility] Consult Note Telephone: [phone]  Fax: [phone]  PATIENT NAME: Darius Smith DOB: October 20, 1945 MRN: [id number]  PRIMARY CARE PROVIDER:   Perlie Mayo, NP  REFERRING PROVIDER:  Perlie Mayo, NP [address] [place],  [place] [address]  RESPONSIBLE PARTY:   Self; wife, Ms. Grassel  1.Advance Care Planning; DNR; Ms. Steinhauser wishes to complete golden rod form with next PC visit in the home in 2 weeks; living will in [place]  2. Goals of Care: Goals include to maximize quality of life and symptom management. Our advance care planning conversation included a discussion about:   The value and importance of advance care planning  Exploration of personal, cultural or spiritual beliefs that might influence medical decisions  Exploration of goals of care in the event of a sudden injury or illness  Identification and preparation of a healthcare agent  Review and updating or creation of anadvance directive document.  3.Palliative care encounter; Palliative care encounter; Palliative medicine team will continue to support patient, patient's family, and medical team. Visit consisted of counseling and education dealing with the complex and emotionally intense issues of symptom management and palliative care in the setting of serious and potentially life-threatening illness  4. f/u 2 months ongoing discussion of goc  I spent 60 minutes providing this consultation,  from 2:30pm to 3:30pm. More than 50% of the time in this consultation was spent coordinating communication.   HISTORY OF PRESENT ILLNESS:  Darius Smith is a 75 y.o. year old male with multiple medical problems including Cardiomegaly with ef 20 to 25%, atrial fibrillation, lymphoma, hypertension, chronic systolic congestive heart failure, gerd, chronic kidney disease, history of kidney stone, arthritis, cholecystectomy, history of cardioversion.I called Darius Smith  to confirm follow up Palliative care visit for Darius Smith, screened covid which was negative. I visited and observed Darius Smith. Edmonia Smith. We talked about purpose of Palliative care visit. Mr and Darius. Smith in agreement. We talked about past medical history. We talked about chronic disease congestive heart failure, chronic kidney disease and lymphoma. We talked about at present time he is not undergoing any treatments for lymphoma and would not want radiation or chemotherapy. We talked about chronic kidney disease stage 3. We talked about congestive heart failure and edema has significantly improved. We talked about hospitalizations and weight loss of close to 40 pounds which was fluid. Darius Smith takes Darius Smith blood pressure, measures his lower extremities daily including monitoring urine output at night. Darius Smith keeps thorough records of measures. We talked about sodium restriction. Darius Smith endorses they had had home health and physical therapy which was considerably helpful for nine weeks so that has been stopped as he has improved. We talked about what services Palliative care provides in a consulting role. We talked about Palliative care does not have on-call hours so should contact primary care provider. We talked about intermittent labile blood pressures for which Cardiology is following and next appointment is next week. Discussed at length about Darius Smith. Currently they are residing in [place]. Explained Community paramedic base program another counties will need to check with [facility] to see if they have that service available. Discuss with Darius Smith will let her know if that is an option for resources. Recent changes in blood pressure medications. Took Darius Smith blood pressure with his monitor reading 160 / 101 and it was time to take his blood pressure medicine for which Mr.  Smith did during [facility] visit. Darius Smith endorses he feels fine no complaints or concerns. Darius Smith  does get a little short of breath with exertion though comfortable and improved with rest. Darius Smith endorses they have not had to use his home oxygen and has returned it. We talked about his appetite which is good. We talked about medical goals of care. We talked about living will that they had done with the attorney. We talked about MOST form left for further review and family discussion. Completed [place] DNR form will place in [place], copy left in home. Darius Smith endorses they put to sleep their son's dog today and just buried him prior to palliative care visit. We talked about grieving. We talked about role Palliative care and plan of care. We talked about follow-up Palliative care visit in two months if needed or sooner should he declined. Darius Smith in agreement and appointment schedule. Therapeutic listening and emotional support provided. Questions answered to satisfaction. Contact information provided.  Palliative Care was asked to help to continue to address goals of care.   CODE STATUS: DNR  PPS: 50% HOSPICE ELIGIBILITY/DIAGNOSIS: TBD  PAST MEDICAL HISTORY:  Past Medical History:  Diagnosis Date  . Allergic rhinitis 07/02/2019  . Arthritis    both hands  . Atrial fibrillation ([place]) 10/28/2017   Dr. Harl Bowie  . Atrial fibrillation with RVR ([place]) 07/14/2020  . Blood in urine    occ  . Bronchitis   . Cancer (HCC)    Basal cell  . Cardiomegaly 10/28/2017  . Complication of anesthesia    [name] developed a. fib in recovery after cysto procedure 10/2017 was transferred to [facility] and underwent successful cardioversion  . Diverticulosis of sigmoid colon 08/2017   Noted on colonoscopy  . Dysrhythmia   . External hemorrhoids 08/2017  . GERD (gastroesophageal reflux disease)   . History of colon polyps 08/2017  . History of kidney stones   . History of rectal bleeding   . History of right inguinal hernia   . Hypertension   . Lymphoma ([place])   . Nephrolithiasis 07/02/2019   . Rectal bleeding 09/02/2017   Added automatically from request for surgery [id number]  . Shortness of breath     SOCIAL HX:  Social History   Tobacco Use  . Smoking status: Never Smoker  . Smokeless tobacco: Never Used  Substance Use Topics  . Alcohol use: Not Currently    Alcohol/week: 7.0 standard drinks    Types: 7 Cans of beer per week    ALLERGIES:  Allergies  Allergen Reactions  . Augmentin [Amoxicillin-Pot Clavulanate] Other (See Comments)    Increased liver enzymes Has patient had a PCN reaction causing immediate rash, facial/tongue/throat swelling, SOB or lightheadedness with hypotension: No Has patient had a PCN reaction causing severe rash involving mucus membranes or skin necrosis: No Has patient had a PCN reaction that required hospitalization: No Has patient had a PCN reaction occurring within the last 10 years: No  If all of the above answers are "NO", then may proceed with Cephalosporin use.   Marland Kitchen Flomax [Tamsulosin Hcl] Nausea Only    Dizziness  Pt is on flomax     PERTINENT MEDICATIONS:  Outpatient Encounter Medications as of 11/06/2020  Medication Sig  . acetaminophen (TYLENOL) 500 MG tablet Take 1,000 mg by mouth every 6 (six) hours as needed for moderate pain or headache.  Marland Kitchen amiodarone (PACERONE) 200 MG tablet Take 200 mg by mouth daily.  . cetirizine (  ZYRTEC) 10 MG tablet Take 1 tablet (10 mg total) by mouth daily as needed for allergies.  Marland Kitchen ELIQUIS 5 MG TABS tablet TAKE ONE TABLET BY MOUTH 2 TIMES A DAY.  . furosemide (LASIX) 40 MG tablet 40mg  in AM, 20mg  at 4 pm.  . hydrALAZINE (APRESOLINE) 100 MG tablet Take 1 tablet (100 mg total) by mouth 3 (three) times daily.  . isosorbide mononitrate (IMDUR) 30 MG 24 hr tablet Take 0.5 tablets (15 mg total) by mouth daily.  . metoprolol succinate (TOPROL XL) 25 MG 24 hr tablet Take 1 tablet (25 mg total) by mouth daily.  Marland Kitchen omeprazole (PRILOSEC) 20 MG capsule   . potassium chloride SA (KLOR-CON) 20 MEQ tablet Take 1  tablet (20 mEq total) by mouth daily.  Marland Kitchen spironolactone (ALDACTONE) 25 MG tablet Take 1 tablet (25 mg total) by mouth daily.  . VENTOLIN HFA 108 (90 Base) MCG/ACT inhaler Inhale 2 puffs into the lungs every 6 (six) hours as needed for wheezing or shortness of breath.   No facility-administered encounter medications on file as of 11/06/2020.    PHYSICAL EXAM:   General: NAD, pleasant male Cardiovascular: regular rate and rhythm Pulmonary: clear ant fields Neurological: ambulatory  Jamara Vary [name], NP

## 2020-11-10 ENCOUNTER — Telehealth: Payer: Self-pay | Admitting: Cardiology

## 2020-11-10 ENCOUNTER — Other Ambulatory Visit: Payer: Self-pay | Admitting: Cardiology

## 2020-11-10 NOTE — Telephone Encounter (Signed)
BP's running too high, please add norvac 5mg  daily and update [place] Friday   Zandra Abts MD

## 2020-11-10 NOTE — Telephone Encounter (Signed)
Calling to give BP readings

## 2020-11-10 NOTE — Telephone Encounter (Signed)
Wife reports BP readings starting day of med increase on 11/05/20 (increased hydralazine to 100 mg TID and added aldactone 25 mg qd)   2/16: 140/74, 173/94  2/17: 141/80, 171/90   2/18: 172/92, 169/89  2/19: 135/82, 156/96  2/20: 595/63,875/64    Wife will call back on Friday with more readings

## 2020-11-11 MED ORDER — AMLODIPINE BESYLATE 5 MG PO TABS: 5.0000 mg | ORAL_TABLET | Freq: Every day | ORAL | 3 refills | Status: DC

## 2020-11-11 NOTE — Telephone Encounter (Signed)
I spoke with wife and they will add Norvasc 5 mg daily, e-scribed to [employer]. They will updat [place] on Friday with BP readings.

## 2020-11-12 ENCOUNTER — Other Ambulatory Visit (HOSPITAL_COMMUNITY)
Admission: RE | Admit: 2020-11-12 | Discharge: 2020-11-12 | Disposition: A | Discharge status: Home / Self Care | Payer: Medicare Other | Source: Ambulatory Visit | Attending: Cardiology | Admitting: Cardiology

## 2020-11-12 ENCOUNTER — Other Ambulatory Visit: Payer: Self-pay

## 2020-11-12 DIAGNOSIS — I5022 Chronic systolic (congestive) heart failure: Secondary | ICD-10-CM | POA: Insufficient documentation

## 2020-11-12 LAB — BASIC METABOLIC PANEL
Anion gap: 7 (ref 5–15)
BUN: 25 mg/dL — ABNORMAL HIGH (ref 8–23)
CO2: 25 mmol/L (ref 22–32)
Calcium: 9 mg/dL (ref 8.9–10.3)
Chloride: 106 mmol/L (ref 98–111)
Creatinine, Ser: 1.51 mg/dL — ABNORMAL HIGH (ref 0.61–1.24)
GFR, Estimated: 48 mL/min — ABNORMAL LOW (ref >60)
Glucose, Bld: 111 mg/dL — ABNORMAL HIGH (ref 70–99)
Potassium: 4.4 mmol/L (ref 3.5–5.1)
Sodium: 138 mmol/L (ref 135–145)

## 2020-11-12 LAB — MAGNESIUM: Magnesium: 2 mg/dL (ref 1.7–2.4)

## 2020-11-14 ENCOUNTER — Other Ambulatory Visit: Payer: Self-pay

## 2020-11-14 ENCOUNTER — Telehealth: Payer: Self-pay | Admitting: Cardiology

## 2020-11-14 MED ORDER — AMLODIPINE BESYLATE 10 MG PO TABS: 10.0000 mg | ORAL_TABLET | Freq: Every day | ORAL | 3 refills | Status: DC

## 2020-11-14 NOTE — Telephone Encounter (Signed)
BP improving but still too high, please increase norvasc to 10mg  daily   Zandra Abts MD

## 2020-11-14 NOTE — Telephone Encounter (Signed)
Spoke to patient's spouse and she verbalized agreement. Will update medications. Spouse wanted to make sure that patient could take two (2) 5 mg norvasc tablets until they ran out as not to waste them.I told her this was acceptable.

## 2020-11-14 NOTE — Telephone Encounter (Signed)
BP reading   2/22 am 135/75 hr 47 pm 145/77 hr 58 2/23 am 119/66 hr 51 pm 137/75 hr 53 2/24 am 136/77 hr 57 pm 169/88 hr 55 2/25 am 141/80 hr 54 pm 141/78 hr 57

## 2020-11-18 ENCOUNTER — Encounter: Payer: Self-pay | Admitting: Nurse Practitioner

## 2020-11-18 ENCOUNTER — Other Ambulatory Visit: Payer: Self-pay

## 2020-11-18 ENCOUNTER — Ambulatory Visit: Payer: Medicare Other | Admitting: Nurse Practitioner

## 2020-11-18 DIAGNOSIS — C8258 Diffuse follicle center lymphoma, lymph nodes of multiple sites: Secondary | ICD-10-CM | POA: Diagnosis not present

## 2020-11-18 DIAGNOSIS — I10 Essential (primary) hypertension: Secondary | ICD-10-CM | POA: Diagnosis not present

## 2020-11-18 DIAGNOSIS — I4891 Unspecified atrial fibrillation: Secondary | ICD-10-CM | POA: Diagnosis not present

## 2020-11-18 DIAGNOSIS — I5022 Chronic systolic (congestive) heart failure: Secondary | ICD-10-CM

## 2020-11-18 NOTE — Assessment & Plan Note (Signed)
-  BP well controlled -cardiology manages meds

## 2020-11-18 NOTE — Assessment & Plan Note (Signed)
-  followed by cancer center -no chemo at this time

## 2020-11-18 NOTE — Assessment & Plan Note (Signed)
-  rate controlled -taking eliquis for anticoagulation

## 2020-11-18 NOTE — Patient Instructions (Addendum)
We printed off the cholesterol labs, so you can take them to your next lab appointment. Please fast prior to getting your cholesterol checked for at least 8 hours.

## 2020-11-18 NOTE — Assessment & Plan Note (Signed)
-  has upcoming cardiology appt -last LVEF was 20-25% -followed by palliative care q2 months

## 2020-11-18 NOTE — Progress Notes (Signed)
Established Patient Office Visit  Subjective:  Patient ID: Darius Smith, male    DOB: Jun 23, 1946  Age: 75 y.o. MRN: [id number]  CC:  Chief Complaint  Patient presents with  . Follow-up    Admitted to [facility] x 1 week for CHF, edema.     HPI Darius Smith presents for follow-up for heart failure.  His last LVEF was 20-25%, and he is seen by palliative care.  He has hx of a-fib.  He went to hospital 2 weeks ago for elevated BP. He was started on hydralazine, and his BP is improved.  Past Medical History:  Diagnosis Date  . Allergic rhinitis 07/02/2019  . Arthritis    both hands  . Atrial fibrillation ([place]) 10/28/2017   Dr. Harl Bowie  . Atrial fibrillation with RVR ([place]) 07/14/2020  . Blood in urine    occ  . Bronchitis   . Cancer (HCC)    Basal cell  . Cardiomegaly 10/28/2017  . Complication of anesthesia    Darius Smith developed a. fib in recovery after cysto procedure 10/2017 was transferred to [facility] and underwent successful cardioversion  . Diverticulosis of sigmoid colon 08/2017   Noted on colonoscopy  . Dysrhythmia   . External hemorrhoids 08/2017  . GERD (gastroesophageal reflux disease)   . History of colon polyps 08/2017  . History of kidney stones   . History of rectal bleeding   . History of right inguinal hernia   . Hypertension   . Lymphoma ([place])   . Nephrolithiasis 07/02/2019  . Rectal bleeding 09/02/2017   Added automatically from request for surgery [id number]  . Shortness of breath     Past Surgical History:  Procedure Laterality Date  . CARDIOVERSION     10/2017  . CARDIOVERSION N/A 07/16/2020   Procedure: CARDIOVERSION;  Surgeon: Arnoldo Lenis, MD;  [facility];  Service: Endoscopy;  Laterality: N/A;  . CARDIOVERSION N/A 08/21/2020   Procedure: CARDIOVERSION;  Surgeon: Satira Sark, MD;  Location: [facility] ORS;  Service: Cardiovascular;  Laterality: N/A;  . CARDIOVERSION N/A 09/10/2020   Procedure: CARDIOVERSION;  Surgeon: Josue Hector,  MD;  Location: [facility] ENDOSCOPY;  Service: Cardiovascular;  Laterality: N/A;  . CHOLECYSTECTOMY    . 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 CLEFT PALATE REPAIR     several from childhood til 75 years old  . COLONOSCOPY  04/28/2011   Procedure: COLONOSCOPY;  Surgeon: Rogene Houston, MD;  [facility];  Service: Endoscopy;  Laterality: N/A;  . COLONOSCOPY N/A 07/18/2014   Procedure: COLONOSCOPY;  Surgeon: Rogene Houston, MD;  [facility];  Service: Endoscopy;  Laterality: N/A;  830  . COLONOSCOPY N/A 09/09/2017   Procedure: COLONOSCOPY;  Surgeon: Rogene Houston, MD;  [facility];  Service: Endoscopy;  Laterality: N/A;  955  . CYSTOSCOPY/URETEROSCOPY/HOLMIUM LASER/STENT PLACEMENT Bilateral 12/06/2017   Procedure: CYSTOSCOPY/RETROGRADE/URETEROSCOPY/HOLMIUM LASER/STENT EXCHANGE;  Surgeon: Festus Aloe, MD;  Location: [facility] ORS;  Service: Urology;  Laterality: Bilateral;  ONLY NEEDS 60 MIN  . HERNIA REPAIR    . POLYPECTOMY  09/09/2017   Procedure: POLYPECTOMY;  Surgeon: Rogene Houston, MD;  [facility];  Service: Endoscopy;;  colon  . vocal cord surgery      Family History  Problem Relation Age of Onset  . Breast cancer Mother   . Prostate cancer Father   . COPD Sister  Social History   Socioeconomic History  . Marital status: Married    Spouse name: Not on file  . Number of children: 2  . Years of education: Not on file  . Highest education level: Not on file  Occupational History  . Occupation: retired  Tobacco Use  . Smoking status: Never Smoker  . Smokeless tobacco: Never Used  Vaping Use  . Vaping Use: Never used  Substance and Sexual Activity  . Alcohol use: Not Currently    Alcohol/week: 7.0 standard drinks    Types: 7 Cans of beer per week  . Drug use: No  . Sexual activity: Not on file  Other Topics Concern  . Not on file  Social History Narrative   Lives with wife [name] of 74 years April 15, 2020      2 children, 2 grandchildren   Dog: Gardner Candle      Enjoys: being outdoors       Diet: eats all food groups    Caffeine: drink coffee in the morning, some tea, diet dr pepper   Water: 3-4 cups daily         Wears seat belt    Does not use phone while driving   [place] put up not loaded          Social Determinants of Radio broadcast assistant Strain: Low Risk   . Difficulty of Paying Living Expenses: Not hard at all  Food Insecurity: No Food Insecurity  . Worried About Charity fundraiser in the Last Year: Never true  . Ran Out of Food in the Last Year: Never true  Transportation Needs: No Transportation Needs  . Lack of Transportation (Medical): No  . Lack of Transportation (Non-Medical): No  Physical Activity: Sufficiently Active  . Days of Exercise per Week: 7 days  . Minutes of Exercise per Session: 60 min  Stress: No Stress Concern Present  . Feeling of Stress : Not at all  Social Connections: Moderately Integrated  . Frequency of Communication with Friends and Family: More than three times a week  . Frequency of Social Gatherings with Friends and Family: Twice a week  . Attends Religious Services: More than 4 times per year  . Active Member of Clubs or Organizations: No  . Attends Archivist Meetings: Never  . Marital Status: Married  Human resources officer Violence: Not At Risk  . Fear of Current or Ex-Partner: No  . Emotionally Abused: No  . Physically Abused: No  . Sexually Abused: No    Outpatient Medications Prior to Visit  Medication Sig Dispense Refill  . acetaminophen (TYLENOL) 500 MG tablet Take 1,000 mg by mouth every 6 (six) hours as needed for moderate pain or headache.    Marland Kitchen amiodarone (PACERONE) 200 MG tablet Take 200 mg by mouth daily.    Marland Kitchen amLODipine (NORVASC) 10 MG tablet Take 1 tablet (10 mg total) by mouth daily. 90 tablet 3  . cetirizine (ZYRTEC) 10 MG tablet Take 1 tablet (10 mg total) by mouth  daily as needed for allergies. 90 tablet 2  . ELIQUIS 5 MG TABS tablet TAKE ONE TABLET BY MOUTH 2 TIMES A DAY. 60 tablet 6  . furosemide (LASIX) 40 MG tablet 40mg  in AM, 20mg  at 4 pm. 45 tablet 1  . hydrALAZINE (APRESOLINE) 100 MG tablet Take 1 tablet (100 mg total) by mouth 3 (three) times daily. 90 tablet 3  . isosorbide mononitrate (  IMDUR) 30 MG 24 hr tablet TAKE 1/2 TABLET BY MOUTH ONCE DAILY. 90 tablet 3  . metoprolol succinate (TOPROL XL) 25 MG 24 hr tablet Take 1 tablet (25 mg total) by mouth daily.    Marland Kitchen omeprazole (PRILOSEC) 20 MG capsule     . potassium chloride SA (KLOR-CON) 20 MEQ tablet Take 1 tablet (20 mEq total) by mouth daily. 90 tablet 3  . spironolactone (ALDACTONE) 25 MG tablet Take 1 tablet (25 mg total) by mouth daily. 90 tablet 1  . VENTOLIN HFA 108 (90 Base) MCG/ACT inhaler Inhale 2 puffs into the lungs every 6 (six) hours as needed for wheezing or shortness of breath. 18 g 3   No facility-administered medications prior to visit.    Allergies  Allergen Reactions  . Augmentin [Amoxicillin-Pot Clavulanate] Other (See Comments)    Increased liver enzymes Has patient had a PCN reaction causing immediate rash, facial/tongue/throat swelling, SOB or lightheadedness with hypotension: No Has patient had a PCN reaction causing severe rash involving mucus membranes or skin necrosis: No Has patient had a PCN reaction that required hospitalization: No Has patient had a PCN reaction occurring within the last 10 years: No  If all of the above answers are "NO", then may proceed with Cephalosporin use.   Marland Kitchen Flomax [Tamsulosin Hcl] Nausea Only    Dizziness  Pt is on flomax    ROS Review of Systems  Constitutional: Negative.   Respiratory: Positive for shortness of breath. Negative for cough, choking and wheezing.        With exertion  Cardiovascular: Positive for leg swelling. Negative for chest pain and palpitations.  Neurological: Negative.   Psychiatric/Behavioral: Negative.        Objective:    Physical Exam Constitutional:      Appearance: Normal appearance.  Cardiovascular:     Rate and Rhythm: Normal rate. Rhythm irregular.     Pulses: Normal pulses.     Heart sounds: Murmur heard.      Comments: 1+ pitting edema to bilateral lower extremities Pulmonary:     Effort: Pulmonary effort is normal.     Breath sounds: Normal breath sounds.  Neurological:     Mental Status: He is alert.  Psychiatric:        Mood and Affect: Mood normal.        Behavior: Behavior normal.        Thought Content: Thought content normal.        Judgment: Judgment normal.     BP 119/74   Pulse (!) 54   Temp 98.1 F (36.7 C)   Resp 18   Ht 6' (1.829 m)   Wt 181 lb (82.1 kg)   SpO2 96%   BMI 24.55 kg/m  Wt Readings from Last 3 Encounters:  11/18/20 181 lb (82.1 kg)  11/05/20 183 lb 12.8 oz (83.4 kg)  11/02/20 180 lb (81.6 kg)     Health Maintenance Due  Topic Date Due  . PNA vac Low Risk Adult (2 of 2 - PCV13) 06/03/2020  . COLONOSCOPY (Pts 45-51yrs Insurance coverage will need to be confirmed)  09/09/2020    There are no preventive care reminders to display for this patient.  Lab Results  Component Value Date   TSH 1.156 08/01/2020   Lab Results  Component Value Date   WBC 7.0 11/02/2020   HGB 13.6 11/02/2020   HCT 43.0 11/02/2020   MCV 91.1 11/02/2020   PLT 214 11/02/2020   Lab Results  Component  Value Date   NA 138 11/12/2020   K 4.4 11/12/2020   CO2 25 11/12/2020   GLUCOSE 111 (H) 11/12/2020   BUN 25 (H) 11/12/2020   CREATININE 1.51 (H) 11/12/2020   BILITOT 0.8 09/23/2020   ALKPHOS 64 09/23/2020   AST 16 09/23/2020   ALT 18 09/23/2020   PROT 7.3 09/23/2020   ALBUMIN 3.4 (L) 09/23/2020   CALCIUM 9.0 11/12/2020   ANIONGAP 7 11/12/2020   Lab Results  Component Value Date   CHOL 111 07/31/2020   Lab Results  Component Value Date   HDL 38 (L) 07/31/2020   Lab Results  Component Value Date   LDLCALC 59 07/31/2020   Lab  Results  Component Value Date   TRIG 62 07/31/2020   Lab Results  Component Value Date   CHOLHDL 2.9 07/31/2020   No results found for: HGBA1C    Assessment & Plan:   Problem List Items Addressed This Visit      Cardiovascular and Mediastinum   Essential hypertension    -BP well controlled -cardiology manages meds      Relevant Orders   Lipid Panel With LDL/HDL Ratio   Atrial fibrillation ([place])    -rate controlled -taking eliquis for anticoagulation      Chronic systolic HF (heart failure) ([place])    -has upcoming cardiology appt -last LVEF was 20-25% -followed by palliative care q2 months        Other   Follicular lymphoma ([place])    -followed by cancer center -no chemo at this time         No orders of the defined types were placed in this encounter.   Follow-up: Return in about 3 months (around 02/18/2021) for Follow-up for chronic conditions.    Noreene Larsson, NP

## 2020-11-21 ENCOUNTER — Encounter: Payer: Self-pay | Admitting: Family Medicine

## 2020-11-21 ENCOUNTER — Other Ambulatory Visit: Payer: Self-pay | Admitting: Cardiology

## 2020-11-26 ENCOUNTER — Ambulatory Visit (INDEPENDENT_AMBULATORY_CARE_PROVIDER_SITE_OTHER): Payer: Medicare Other

## 2020-11-26 DIAGNOSIS — I5022 Chronic systolic (congestive) heart failure: Secondary | ICD-10-CM | POA: Diagnosis not present

## 2020-11-26 LAB — ECHOCARDIOGRAM COMPLETE
AR max vel: 1.55 cm2
AV Area VTI: 1.54 cm2
AV Area mean vel: 1.62 cm2
AV Mean grad: 13 mmHg
AV Peak grad: 24.7 mmHg
AV Vena cont: 0.26 cm
Ao pk vel: 2.49 m/s
Area-P 1/2: 2.12 cm2
Calc EF: 54.6 %
P 1/2 time: 3277 msec
S' Lateral: 4.8 cm
Single Plane A2C EF: 53.5 %
Single Plane A4C EF: 54.4 %

## 2020-11-28 ENCOUNTER — Telehealth: Payer: Self-pay | Admitting: *Deleted

## 2020-11-28 DIAGNOSIS — I10 Essential (primary) hypertension: Secondary | ICD-10-CM

## 2020-11-28 NOTE — Telephone Encounter (Signed)
Pt wife [name] [facility]) voiced understanding and will have labs done next week at [facility] - orders placed    Please call the patient and let him know the echocardiogram showed the pumping function of his heart has significantly improved. The main pumping chamber is a little stiff and has problems relaxing when trying to fill with blood. Best management is to maintain BP at or below 130/80 and manage all other risk factors.

## 2020-11-28 NOTE — Telephone Encounter (Signed)
-----   Message from Arnoldo Lenis, MD sent at 11/27/2020  4:39 PM EST ----- BP log reviewed, bp's overall are impoving. Occasionally elevated at nighttime, what time of day is he taking that 3rd hydralazine   Zandra Abts MD

## 2020-11-28 NOTE — Telephone Encounter (Signed)
Pt voiced understanding - is taking hydralazine at 8am/2pm/7pm - says he took BP last night at 10:30pm and 125/65 HR 56

## 2020-11-28 NOTE — Telephone Encounter (Signed)
-----   Message from Arnoldo Lenis, MD sent at 11/28/2020 11:55 AM EST ----- Last labs showed mildly increased stress on the kidneys but within prior range, can he get a bmet next week please for an update   Zandra Abts MD

## 2020-12-01 ENCOUNTER — Encounter: Payer: Self-pay | Admitting: Nurse Practitioner

## 2020-12-04 ENCOUNTER — Other Ambulatory Visit (HOSPITAL_COMMUNITY)
Admission: RE | Admit: 2020-12-04 | Discharge: 2020-12-04 | Disposition: A | Discharge status: Home / Self Care | Payer: Medicare Other | Source: Ambulatory Visit | Attending: Cardiology | Admitting: Cardiology

## 2020-12-04 ENCOUNTER — Other Ambulatory Visit: Payer: Self-pay

## 2020-12-04 DIAGNOSIS — I5022 Chronic systolic (congestive) heart failure: Secondary | ICD-10-CM | POA: Insufficient documentation

## 2020-12-04 LAB — BASIC METABOLIC PANEL
Anion gap: 8 (ref 5–15)
BUN: 34 mg/dL — ABNORMAL HIGH (ref 8–23)
CO2: 22 mmol/L (ref 22–32)
Calcium: 8.9 mg/dL (ref 8.9–10.3)
Chloride: 109 mmol/L (ref 98–111)
Creatinine, Ser: 1.88 mg/dL — ABNORMAL HIGH (ref 0.61–1.24)
GFR, Estimated: 37 mL/min — ABNORMAL LOW (ref >60)
Glucose, Bld: 100 mg/dL — ABNORMAL HIGH (ref 70–99)
Potassium: 4.5 mmol/L (ref 3.5–5.1)
Sodium: 139 mmol/L (ref 135–145)

## 2020-12-04 LAB — MAGNESIUM: Magnesium: 2 mg/dL (ref 1.7–2.4)

## 2020-12-05 ENCOUNTER — Telehealth: Payer: Self-pay | Admitting: *Deleted

## 2020-12-05 DIAGNOSIS — I10 Essential (primary) hypertension: Secondary | ICD-10-CM

## 2020-12-05 NOTE — Telephone Encounter (Signed)
Pt wife voiced understanding [name] [facility]) updated medication list and lab orders place pt will have labs done again at [facility] in 2 weeks

## 2020-12-05 NOTE — Telephone Encounter (Signed)
-----   Message from Arnoldo Lenis, MD sent at 12/05/2020  8:52 AM EDT ----- Some increased stress on kidneys. I would skip his lasix x 2 days (if already took today then skip Sat and SUn), and when restarts would take just 40mg  daily, but take additional 20mg  as needed for weight gain above 3 pounds or significant swelling. Repeat a bmet/mg in 2 weeks.    J BrancH MD

## 2020-12-10 ENCOUNTER — Other Ambulatory Visit: Payer: Self-pay | Admitting: Cardiology

## 2020-12-10 ENCOUNTER — Other Ambulatory Visit: Payer: Self-pay | Admitting: *Deleted

## 2020-12-10 ENCOUNTER — Telehealth: Payer: Self-pay | Admitting: Nurse Practitioner

## 2020-12-10 MED ORDER — AMLODIPINE BESYLATE 10 MG PO TABS: 10.0000 mg | ORAL_TABLET | Freq: Every day | ORAL | 3 refills | Status: DC

## 2020-12-10 NOTE — Telephone Encounter (Signed)
Per wife, amlodipine 10 mg bottle has take with benazepril. Advised that he is not on benazepril. [place] contacted, spoke with April who says (take with benazepril was inadvertently placed on bottle) and she will remove that form his next rx. Wife informed and verbalized understanding.

## 2020-12-10 NOTE — Telephone Encounter (Signed)
Eliquis 5mg  refill request received. Patient is 75 years old, weight-82.1kg, Crea-1.88 on 12/04/2020, Diagnosis-Afib, and last seen by Dr. Harl Bowie on 11/05/2020. Dose is appropriate based on dosing criteria. Will send in refill to requested pharmacy.

## 2020-12-10 NOTE — Telephone Encounter (Signed)
Spoke with patient's wife, [name], to see if we could change the in-home Palliative f/u visit scheduled for 01/08/21 @ 3 PM to a telehealth f/u visit at 4 PM on that same day and she was in agreement with this.  I asked her how the patient was doing and she said that he was doing really well.  Will notify Palliative NP.

## 2020-12-16 ENCOUNTER — Other Ambulatory Visit (HOSPITAL_COMMUNITY): Payer: Self-pay | Admitting: Physician Assistant

## 2020-12-16 NOTE — Progress Notes (Signed)
Primary Care Physician: Perlie Mayo, NP Primary Cardiologist: Dr Harl Bowie Primary Electrophysiologist: none Referring Physician: Ermalinda Barrios   Darius Smith is a 75 y.o. male with a history of chronic systolic CHF, HTN, stage IV lymphoma, and persistent atrial fibrillation who presents for consultation in the [facility].  The patient was initially diagnosed with atrial fibrillation 2019 in the setting of a urology procedure. He was found to be back in afib on follow up 06/23/20 with a new cardiomyopathy, EF 20-25%. Patient is on Eliquis for a CHADS2VASC score of 3. He was admitted 07/14/20 for afib RVR with acute CHF. DCCV on 07/16/20 was unsuccessful and he was loaded on amiodarone. He underwent repeat DCCV on 08/21/20 which was initially successful but was back in afib on follow up12/6/21. Patient is mostly unaware of his afib with only mild palpitations. He denies any alcohol use. He has snored all his life 2/2 h/o cleft palate, no witnessed apnea or daytime somnolence. Patient is s/p DCCV on 09/10/20.   On follow up today, patient reports he has done well since his last visit. He denies any heart racing or palpitations. He reports his BP has been better controlled with the recent increases in his medication. He denies any bleeding issues on anticoagulation. He remains very active.   Today, he denies symptoms of palpitations, chest pain, shortness of breath, orthopnea, PND, lower extremity edema, presyncope, syncope, daytime somnolence, bleeding, or neurologic sequela. The patient is tolerating medications without difficulties and is otherwise without complaint today.    Atrial Fibrillation Risk Factors:  he does not have symptoms or diagnosis of sleep apnea. he does not have a history of rheumatic fever. he does not have a history of alcohol use. The patient does not have a history of early familial atrial fibrillation or other arrhythmias.  he has a BMI of  Body mass index is 25.14 kg/m.Marland Kitchen Filed Weights   12/17/20 0926  Weight: 84.1 kg    Family History  Problem Relation Age of Onset  . Breast cancer Mother   . Prostate cancer Father   . COPD Sister      Atrial Fibrillation Management history:  Previous antiarrhythmic drugs: amiodarone Previous cardioversions: 07/16/20, 08/21/20, 09/10/20 Previous ablations: none CHADS2VASC score: 3 Anticoagulation history: Eliquis   Past Medical History:  Diagnosis Date  . Allergic rhinitis 07/02/2019  . Arthritis    both hands  . Atrial fibrillation ([place]) 10/28/2017   Dr. Harl Bowie  . Atrial fibrillation with RVR ([place]) 07/14/2020  . Blood in urine    occ  . Bronchitis   . Cancer (HCC)    Basal cell  . Cardiomegaly 10/28/2017  . Complication of anesthesia    Mr. Hurwitz developed a. fib in recovery after cysto procedure 10/2017 was transferred to [facility] and underwent successful cardioversion  . Diverticulosis of sigmoid colon 08/2017   Noted on colonoscopy  . Dysrhythmia   . External hemorrhoids 08/2017  . GERD (gastroesophageal reflux disease)   . History of colon polyps 08/2017  . History of kidney stones   . History of rectal bleeding   . History of right inguinal hernia   . Hypertension   . Lymphoma ([place])   . Nephrolithiasis 07/02/2019  . Rectal bleeding 09/02/2017   Added automatically from request for surgery [phone]  . Shortness of breath    Past Surgical History:  Procedure Laterality Date  . CARDIOVERSION     10/2017  . CARDIOVERSION N/A 07/16/2020  Procedure: CARDIOVERSION;  Surgeon: Arnoldo Lenis, MD;  [facility];  Service: Endoscopy;  Laterality: N/A;  . CARDIOVERSION N/A 08/21/2020   Procedure: CARDIOVERSION;  Surgeon: Satira Sark, MD;  Location: [facility] ORS;  Service: Cardiovascular;  Laterality: N/A;  . CARDIOVERSION N/A 09/10/2020   Procedure: CARDIOVERSION;  Surgeon: Josue Hector, MD;  Location: [facility] ENDOSCOPY;  Service: Cardiovascular;   Laterality: N/A;  . CHOLECYSTECTOMY    . 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 CLEFT PALATE REPAIR     several from childhood til 75 years old  . COLONOSCOPY  04/28/2011   Procedure: COLONOSCOPY;  Surgeon: Rogene Houston, MD;  [facility];  Service: Endoscopy;  Laterality: N/A;  . COLONOSCOPY N/A 07/18/2014   Procedure: COLONOSCOPY;  Surgeon: Rogene Houston, MD;  [facility];  Service: Endoscopy;  Laterality: N/A;  830  . COLONOSCOPY N/A 09/09/2017   Procedure: COLONOSCOPY;  Surgeon: Rogene Houston, MD;  Location: [facility];  Service: Endoscopy;  Laterality: N/A;  955  . CYSTOSCOPY/URETEROSCOPY/HOLMIUM LASER/STENT PLACEMENT Bilateral 12/06/2017   Procedure: CYSTOSCOPY/RETROGRADE/URETEROSCOPY/HOLMIUM LASER/STENT EXCHANGE;  Surgeon: Festus Aloe, MD;  Location: [facility] ORS;  Service: Urology;  Laterality: Bilateral;  ONLY NEEDS 60 MIN  . HERNIA REPAIR    . POLYPECTOMY  09/09/2017   Procedure: POLYPECTOMY;  Surgeon: Rogene Houston, MD;  Location: [facility] END[facility];  Service: Endoscopy;;  colon  . vocal cord surgery      Current Outpatient Medications  Medication Sig Dispense Refill  . acetaminophen (TYLENOL) 500 MG tablet Take 1,000 mg by mouth every 6 (six) hours as needed for moderate pain or headache.    Marland Kitchen amiodarone (PACERONE) 200 MG tablet Take 1 tablet (200 mg total) by mouth daily. 60 tablet 0  . amLODipine (NORVASC) 10 MG tablet Take 1 tablet (10 mg total) by mouth daily. 90 tablet 3  . cetirizine (ZYRTEC) 10 MG tablet Take 1 tablet (10 mg total) by mouth daily as needed for allergies. 90 tablet 2  . ELIQUIS 5 MG TABS tablet TAKE ONE TABLET BY MOUTH 2 TIMES A DAY. 60 tablet 5  . furosemide (LASIX) 40 MG tablet Take 40 mg by mouth. Take 40 mg daily - may take additional 20 mg as needed for weight gain/swelling    . hydrALAZINE (APRESOLINE) 100 MG tablet Take 1 tablet (100 mg total) by mouth 3 (three) times daily. 90 tablet 3  .  isosorbide mononitrate (IMDUR) 30 MG 24 hr tablet TAKE 1/2 TABLET BY MOUTH ONCE DAILY. 90 tablet 3  . metoprolol succinate (TOPROL XL) 25 MG 24 hr tablet Take 1 tablet (25 mg total) by mouth daily.    Marland Kitchen omeprazole (PRILOSEC) 20 MG capsule     . potassium chloride SA (KLOR-CON) 20 MEQ tablet Take 1 tablet (20 mEq total) by mouth daily. 90 tablet 3  . spironolactone (ALDACTONE) 25 MG tablet Take 1 tablet (25 mg total) by mouth daily. 90 tablet 1  . VENTOLIN HFA 108 (90 Base) MCG/ACT inhaler Inhale 2 puffs into the lungs every 6 (six) hours as needed for wheezing or shortness of breath. 18 g 3   No current facility-administered medications for this encounter.    Allergies  Allergen Reactions  . Augmentin [Amoxicillin-Pot Clavulanate] Other (See Comments)    Increased liver enzymes Has patient had a PCN reaction causing immediate rash, facial/tongue/throat swelling, SOB or lightheadedness with hypotension: No Has patient had  a PCN reaction causing severe rash involving mucus membranes or skin necrosis: No Has patient had a PCN reaction that required hospitalization: No Has patient had a PCN reaction occurring within the last 10 years: No  If all of the above answers are "NO", then may proceed with Cephalosporin use.   Marland Kitchen Flomax [Tamsulosin Hcl] Nausea Only    Dizziness  Pt is on flomax    Social History   Socioeconomic History  . Marital status: Married    Spouse name: Not on file  . Number of children: 2  . Years of education: Not on file  . Highest education level: Not on file  Occupational History  . Occupation: retired  Tobacco Use  . Smoking status: Never Smoker  . Smokeless tobacco: Never Used  Vaping Use  . Vaping Use: Never used  Substance and Sexual Activity  . Alcohol use: Not Currently    Alcohol/week: 7.0 standard drinks    Types: 7 Cans of beer per week  . Drug use: No  . Sexual activity: Not on file  Other Topics Concern  . Not on file  Social History Narrative    Lives with wife [name] of 49 years April 15, 2020      2 children, 2 grandchildren   Dog: Gardner Candle      Enjoys: being outdoors       Diet: eats all food groups    Caffeine: drink coffee in the morning, some tea, diet dr pepper   Water: 3-4 cups daily         Wears seat belt    Does not use phone while driving   [place] put up not loaded          Social Determinants of Radio broadcast assistant Strain: Low Risk   . Difficulty of Paying Living Expenses: Not hard at all  Food Insecurity: No Food Insecurity  . Worried About Charity fundraiser in the Last Year: Never true  . Ran Out of Food in the Last Year: Never true  Transportation Needs: No Transportation Needs  . Lack of Transportation (Medical): No  . Lack of Transportation (Non-Medical): No  Physical Activity: Sufficiently Active  . Days of Exercise per Week: 7 days  . Minutes of Exercise per Session: 60 min  Stress: No Stress Concern Present  . Feeling of Stress : Not at all  Social Connections: Moderately Integrated  . Frequency of Communication with Friends and Family: More than three times a week  . Frequency of Social Gatherings with Friends and Family: Twice a week  . Attends Religious Services: More than 4 times per year  . Active Member of Clubs or Organizations: No  . Attends Archivist Meetings: Never  . Marital Status: Married  Human resources officer Violence: Not At Risk  . Fear of Current or Ex-Partner: No  . Emotionally Abused: No  . Physically Abused: No  . Sexually Abused: No     ROS- All systems are reviewed and negative except as per the HPI above.  Physical Exam: Vitals:   12/17/20 0926  BP: 132/62  Pulse: (!) 51  Weight: 84.1 kg  Height: 6' (1.829 m)    GEN- The patient is a well appearing elderly male, alert and oriented x 3 today.   HEENT-head normocephalic, atraumatic, sclera clear, conjunctiva pink, hearing intact, trachea midline. Lungs- Clear to  ausculation bilaterally, normal work of breathing Heart- Regular rate and rhythm,  bradycardia, no rubs or gallops. 2/6 systolic murmur  GI- soft, NT, ND, + BS Extremities- no clubbing, cyanosis, or edema MS- no significant deformity or atrophy Skin- no rash or lesion Psych- euthymic mood, full affect Neuro- strength and sensation are intact   Wt Readings from Last 3 Encounters:  12/17/20 84.1 kg  11/18/20 82.1 kg  11/05/20 83.4 kg    EKG today demonstrates  SB Vent. rate 51 BPM PR interval 176 ms QRS duration 112 ms QT/QTcB 478/440 ms  Echo 11/26/20 demonstrated   1. Left ventricular ejection fraction, by estimation, is 50%. The left  ventricle has mildly decreased function. The left ventricle has no  regional wall motion abnormalities. The left ventricular internal cavity  size was mildly dilated. Left ventricular diastolic parameters are consistent with Grade I diastolic dysfunction (impaired relaxation). The average left ventricular global longitudinal strain is -18.2 %. The global longitudinal strain is normal.  2. Right ventricular systolic function is normal. The right ventricular  size is normal.  3. Left atrial size was severely dilated.  4. Right atrial size was mildly dilated.  5. The mitral valve is normal in structure. No evidence of mitral valve  regurgitation. No evidence of mitral stenosis.  6. The aortic valve was not well visualized. There is mild calcification  of the aortic valve. There is mild thickening of the aortic valve. Aortic  valve regurgitation is mild. Mild aortic valve stenosis.  7. The inferior vena cava is normal in size with greater than 50%  respiratory variability, suggesting right atrial pressure of 3 mmHg.   Comparison(s): Echocardiogram done 06/12/20 showed an EF of 20-25%.   Epic records are reviewed at length today  CHA2DS2-VASc Score = 3  The patient's score is based upon: CHF History: Yes HTN History: Yes Diabetes History:  No Stroke History: No Vascular Disease History: No Age Score: 1 Gender Score: 0      ASSESSMENT AND PLAN: 1. Persistent Atrial Fibrillation (ICD10:  I48.19) The patient's CHA2DS2-VASc score is 3, indicating a 3.2% annual risk of stroke.   Patient appears to be maintaining SR. Continue Toprol 100 mg daily Continue amiodarone 200 mg daily Continue Eliquis 5 mg BID  2. Secondary Hypercoagulable State (ICD10:  D68.69) The patient is at significant risk for stroke/thromboembolism based upon his CHA2DS2-VASc Score of 3.  Continue Apixaban (Eliquis).   3. Chronic systolic CHF EF improved to 50%.  No signs or symptoms of fluid overload.  4. Stage IV lymphoma Not treating per oncology as he is asymptomatic.   Follow up with Dr Harl Bowie as scheduled. AF clinic in 6 months.    [facility] [address] [place], [place] [address] [phone] 12/17/2020 9:47 AM

## 2020-12-17 ENCOUNTER — Encounter (HOSPITAL_COMMUNITY): Payer: Self-pay | Admitting: Physician Assistant

## 2020-12-17 ENCOUNTER — Other Ambulatory Visit: Payer: Self-pay

## 2020-12-17 ENCOUNTER — Ambulatory Visit (HOSPITAL_COMMUNITY)
Admission: RE | Admit: 2020-12-17 | Discharge: 2020-12-17 | Disposition: A | Discharge status: Home / Self Care | Payer: Medicare Other | Source: Ambulatory Visit | Attending: Physician Assistant | Admitting: Physician Assistant

## 2020-12-17 VITALS — BP 132/62 | HR 51 | Ht 72.0 in | Wt 185.4 lb

## 2020-12-17 DIAGNOSIS — Z7901 Long term (current) use of anticoagulants: Secondary | ICD-10-CM | POA: Insufficient documentation

## 2020-12-17 DIAGNOSIS — I4819 Other persistent atrial fibrillation: Secondary | ICD-10-CM | POA: Insufficient documentation

## 2020-12-17 DIAGNOSIS — I110 Hypertensive heart disease with heart failure: Secondary | ICD-10-CM | POA: Diagnosis not present

## 2020-12-17 DIAGNOSIS — Z79899 Other long term (current) drug therapy: Secondary | ICD-10-CM | POA: Diagnosis not present

## 2020-12-17 DIAGNOSIS — I5022 Chronic systolic (congestive) heart failure: Secondary | ICD-10-CM | POA: Diagnosis not present

## 2020-12-17 DIAGNOSIS — C8590 Non-Hodgkin lymphoma, unspecified, unspecified site: Secondary | ICD-10-CM | POA: Insufficient documentation

## 2020-12-17 DIAGNOSIS — D6869 Other thrombophilia: Secondary | ICD-10-CM | POA: Insufficient documentation

## 2020-12-18 ENCOUNTER — Encounter: Payer: Self-pay | Admitting: Cardiology

## 2020-12-18 ENCOUNTER — Ambulatory Visit: Payer: Medicare Other | Admitting: Cardiology

## 2020-12-18 ENCOUNTER — Other Ambulatory Visit: Payer: Self-pay | Admitting: *Deleted

## 2020-12-18 VITALS — BP 104/62 | HR 56 | Ht 72.0 in | Wt 185.6 lb

## 2020-12-18 DIAGNOSIS — I10 Essential (primary) hypertension: Secondary | ICD-10-CM | POA: Diagnosis not present

## 2020-12-18 DIAGNOSIS — I4891 Unspecified atrial fibrillation: Secondary | ICD-10-CM

## 2020-12-18 DIAGNOSIS — I5022 Chronic systolic (congestive) heart failure: Secondary | ICD-10-CM

## 2020-12-18 NOTE — Progress Notes (Signed)
Clinical Summary Darius Smith is a 75 y.o.male seen today for follow up of the following medical problems.  1.Chronic systolic HF - 11/2120 echo LVEF 20-25%. New diagnosis of systolic HF at that time. - recent admission 06/2020 with volume overload.BNP 2000s, CXR with edema. - diuresis was limited by elevation in Cr -with elevation in Cr we stopped his entresto, started hydral/imdur.  - systolic dysfunction thought to be tachy mediated as significant issues with afib with RVR at the same time.  - have not pursued ischemic testing as of yet, repeat echo few months after afib controlled and pending renal function   - 11/2020 echo LVEF 50% - recent elevation in Cr. Was to skip diuretic x 2 days, then decrease to 40mg  daily.  -no SOB/DOE, no recent edema.  - home weights as low as 171 in January, slowly trending up to 180 but no other signs of fluid retention. Has had increased appetite  2. Persistent afib - dynamaps do not accuratelty measure his HRs, best measured by EKG> - issues with elevated HRs recently including during recent admission -difficulty controlling with beta blocker alone, avoiding CCB due to systolic dysfunction, renal function and HF limit antiarrhythmic options. During admission we started amiodarone - failed attempt at [facility] 07/16/20, plan to continue oral load and retry in 3 weeks.  - on amio, DCCVs on 12/2 and 09/10/20, last 09/10/20 conversion was succesful. - has been maintaining SR at follow ups.  - no palpitations  3. Stage IV lymphoma - from onc notes just monitoring at this time, in absence of symptoms no indciation for therapy. - I did hear back from oncology, they report his lymphoma is not curable and treatment only helps symptoms. Prognosis can be a few years to a few decades, and they do not see a contraindciation to proceeding with cath in the future.  4. HTN - compliant with meds -home bp's 110-130/50s-60s    Wife is former  Set designer at [employer]. Very good at keeping track of his weights, vitals etc.    Past Medical History:  Diagnosis Date  . Allergic rhinitis 07/02/2019  . Arthritis    both hands  . Atrial fibrillation ([place]) 10/28/2017   Dr. Harl Bowie  . Atrial fibrillation with RVR ([place]) 07/14/2020  . Blood in urine    occ  . Bronchitis   . Cancer (HCC)    Basal cell  . Cardiomegaly 10/28/2017  . Complication of anesthesia    Darius Smith developed a. fib in recovery after cysto procedure 10/2017 was transferred to [facility] and underwent successful cardioversion  . Diverticulosis of sigmoid colon 08/2017   Noted on colonoscopy  . Dysrhythmia   . External hemorrhoids 08/2017  . GERD (gastroesophageal reflux disease)   . History of colon polyps 08/2017  . History of kidney stones   . History of rectal bleeding   . History of right inguinal hernia   . Hypertension   . Lymphoma ([place])   . Nephrolithiasis 07/02/2019  . Rectal bleeding 09/02/2017   Added automatically from request for surgery [id number]  . Shortness of breath      Allergies  Allergen Reactions  . Augmentin [Amoxicillin-Pot Clavulanate] Other (See Comments)    Increased liver enzymes Has patient had a PCN reaction causing immediate rash, facial/tongue/throat swelling, SOB or lightheadedness with hypotension: No Has patient had a PCN reaction causing severe rash involving mucus membranes or skin necrosis: No Has patient had a PCN reaction that  required hospitalization: No Has patient had a PCN reaction occurring within the last 10 years: No  If all of the above answers are "NO", then may proceed with Cephalosporin use.   Marland Kitchen Flomax [Tamsulosin Hcl] Nausea Only    Dizziness  Pt is on flomax     Current Outpatient Medications  Medication Sig Dispense Refill  . acetaminophen (TYLENOL) 500 MG tablet Take 1,000 mg by mouth every 6 (six) hours as needed for moderate pain or headache.    Marland Kitchen amiodarone (PACERONE) 200  MG tablet Take 1 tablet (200 mg total) by mouth daily. 60 tablet 0  . amLODipine (NORVASC) 10 MG tablet Take 1 tablet (10 mg total) by mouth daily. 90 tablet 3  . cetirizine (ZYRTEC) 10 MG tablet Take 1 tablet (10 mg total) by mouth daily as needed for allergies. 90 tablet 2  . ELIQUIS 5 MG TABS tablet TAKE ONE TABLET BY MOUTH 2 TIMES A DAY. 60 tablet 5  . furosemide (LASIX) 40 MG tablet Take 40 mg by mouth. Take 40 mg daily - may take additional 20 mg as needed for weight gain/swelling    . hydrALAZINE (APRESOLINE) 100 MG tablet Take 1 tablet (100 mg total) by mouth 3 (three) times daily. 90 tablet 3  . isosorbide mononitrate (IMDUR) 30 MG 24 hr tablet TAKE 1/2 TABLET BY MOUTH ONCE DAILY. 90 tablet 3  . metoprolol succinate (TOPROL XL) 25 MG 24 hr tablet Take 1 tablet (25 mg total) by mouth daily.    Marland Kitchen omeprazole (PRILOSEC) 20 MG capsule     . potassium chloride SA (KLOR-CON) 20 MEQ tablet Take 1 tablet (20 mEq total) by mouth daily. 90 tablet 3  . spironolactone (ALDACTONE) 25 MG tablet Take 1 tablet (25 mg total) by mouth daily. 90 tablet 1  . VENTOLIN HFA 108 (90 Base) MCG/ACT inhaler Inhale 2 puffs into the lungs every 6 (six) hours as needed for wheezing or shortness of breath. 18 g 3   No current facility-administered medications for this visit.     Past Surgical History:  Procedure Laterality Date  . CARDIOVERSION     10/2017  . CARDIOVERSION N/A 07/16/2020   Procedure: CARDIOVERSION;  Surgeon: Arnoldo Lenis, MD;  [facility];  Service: Endoscopy;  Laterality: N/A;  . CARDIOVERSION N/A 08/21/2020   Procedure: CARDIOVERSION;  Surgeon: Satira Sark, MD;  Location: [facility] ORS;  Service: Cardiovascular;  Laterality: N/A;  . CARDIOVERSION N/A 09/10/2020   Procedure: CARDIOVERSION;  Surgeon: Josue Hector, MD;  Location: [facility] ENDOSCOPY;  Service: Cardiovascular;  Laterality: N/A;  . CHOLECYSTECTOMY    . 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  CLEFT PALATE REPAIR     several from childhood til 75 years old  . COLONOSCOPY  04/28/2011   Procedure: COLONOSCOPY;  Surgeon: Rogene Houston, MD;  [facility];  Service: Endoscopy;  Laterality: N/A;  . COLONOSCOPY N/A 07/18/2014   Procedure: COLONOSCOPY;  Surgeon: Rogene Houston, MD;  [facility];  Service: Endoscopy;  Laterality: N/A;  830  . COLONOSCOPY N/A 09/09/2017   Procedure: COLONOSCOPY;  Surgeon: Rogene Houston, MD;  [facility];  Service: Endoscopy;  Laterality: N/A;  955  . CYSTOSCOPY/URETEROSCOPY/HOLMIUM LASER/STENT PLACEMENT Bilateral 12/06/2017   Procedure: CYSTOSCOPY/RETROGRADE/URETEROSCOPY/HOLMIUM LASER/STENT EXCHANGE;  Surgeon: Festus Aloe, MD;  [facility];  Service: Urology;  Laterality: Bilateral;  ONLY NEEDS 60 MIN  . HERNIA REPAIR    .  POLYPECTOMY  09/09/2017   Procedure: POLYPECTOMY;  Surgeon: Rogene Houston, MD;  [facility];  Service: Endoscopy;;  colon  . vocal cord surgery       Allergies  Allergen Reactions  . Augmentin [Amoxicillin-Pot Clavulanate] Other (See Comments)    Increased liver enzymes Has patient had a PCN reaction causing immediate rash, facial/tongue/throat swelling, SOB or lightheadedness with hypotension: No Has patient had a PCN reaction causing severe rash involving mucus membranes or skin necrosis: No Has patient had a PCN reaction that required hospitalization: No Has patient had a PCN reaction occurring within the last 10 years: No  If all of the above answers are "NO", then may proceed with Cephalosporin use.   Marland Kitchen Flomax [Tamsulosin Hcl] Nausea Only    Dizziness  Pt is on flomax      Family History  Problem Relation Age of Onset  . Breast cancer Mother   . Prostate cancer Father   . COPD Sister      Social History Darius Smith reports that he has never smoked. He has never used smokeless tobacco. Darius Smith reports previous alcohol use of about 7.0 standard drinks of  alcohol per week.   Review of Systems CONSTITUTIONAL: No weight loss, fever, chills, weakness or fatigue.  HEENT: Eyes: No visual loss, blurred vision, double vision or yellow sclerae.No hearing loss, sneezing, congestion, runny nose or sore throat.  SKIN: No rash or itching.  CARDIOVASCULAR: per hpi RESPIRATORY: No shortness of breath, cough or sputum.  GASTROINTESTINAL: No anorexia, nausea, vomiting or diarrhea. No abdominal pain or blood.  GENITOURINARY: No burning on urination, no polyuria NEUROLOGICAL: No headache, dizziness, syncope, paralysis, ataxia, numbness or tingling in the extremities. No change in bowel or bladder control.  MUSCULOSKELETAL: No muscle, back pain, joint pain or stiffness.  LYMPHATICS: No enlarged nodes. No history of splenectomy.  PSYCHIATRIC: No history of depression or anxiety.  ENDOCRINOLOGIC: No reports of sweating, cold or heat intolerance. No polyuria or polydipsia.  Marland Kitchen   Physical Examination Today's Vitals   12/18/20 0837  BP: 104/62  Pulse: (!) 56  SpO2: 96%  Weight: 185 lb 9.6 oz (84.2 kg)  Height: 6' (1.829 m)   Body mass index is 25.17 kg/m.  Gen: resting comfortably, no acute distress HEENT: no scleral icterus, pupils equal round and reactive, no palptable cervical adenopathy,  CV: RRR, no m/r/g, no jvd Resp: Clear to auscultation bilaterally GI: abdomen is soft, non-tender, non-distended, normal bowel sounds, no hepatosplenomegaly MSK: extremities are warm, no edema.  Skin: warm, no rash Neuro:  no focal deficits Psych: appropriate affect   Diagnostic Studies 11/2020 echo IMPRESSIONS    1. Left ventricular ejection fraction, by estimation, is 50%. The left  ventricle has mildly decreased function. The left ventricle has no  regional wall motion abnormalities. The left ventricular internal cavity  size was mildly dilated. Left ventricular  diastolic parameters are consistent with Grade I diastolic dysfunction  (impaired  relaxation). The average left ventricular global longitudinal  strain is -18.2 %. The global longitudinal strain is normal.  2. Right ventricular systolic function is normal. The right ventricular  size is normal.  3. Left atrial size was severely dilated.  4. Right atrial size was mildly dilated.  5. The mitral valve is normal in structure. No evidence of mitral valve  regurgitation. No evidence of mitral stenosis.  6. The aortic valve was not well visualized. There is mild calcification  of the aortic valve. There is mild  thickening of the aortic valve. Aortic  valve regurgitation is mild. Mild aortic valve stenosis.  7. The inferior vena cava is normal in size with greater than 50%  respiratory variability, suggesting right atrial pressure of 3 mmHg.   Comparison(s): Echocardiogram done 06/12/20 showed an EF of 20-25%.     Assessment and Plan   1. Chronic systolic HF - possibly tachy mediated given recent issues with afib with RVR -entresto stopped during recent admission with AKI. Cr has trended down, restart aldactone continue to hold entresto for now.  - LVEF has now normalized, continue current meds. No recent symptoms.  - f/u labs, may need further adjustment of diuretic pending renal function.    2. Afib -doing well, continue current meds  3. HTN - at goal, continue current meds    Arnoldo Lenis, M.D.

## 2020-12-18 NOTE — Patient Instructions (Signed)
Your physician recommends that you schedule a follow-up appointment in: [place]  Your physician recommends that you continue on your current medications as directed. Please refer to the Current Medication list given to you today.  Thank you for choosing [facility]!!

## 2020-12-19 ENCOUNTER — Other Ambulatory Visit (HOSPITAL_COMMUNITY)
Admission: RE | Admit: 2020-12-19 | Discharge: 2020-12-19 | Disposition: A | Discharge status: Home / Self Care | Payer: Medicare Other | Source: Ambulatory Visit | Attending: Cardiology | Admitting: Cardiology

## 2020-12-19 ENCOUNTER — Other Ambulatory Visit: Payer: Self-pay

## 2020-12-19 DIAGNOSIS — I10 Essential (primary) hypertension: Secondary | ICD-10-CM | POA: Insufficient documentation

## 2020-12-19 LAB — BASIC METABOLIC PANEL
Anion gap: 7 (ref 5–15)
BUN: 30 mg/dL — ABNORMAL HIGH (ref 8–23)
CO2: 22 mmol/L (ref 22–32)
Calcium: 8.8 mg/dL — ABNORMAL LOW (ref 8.9–10.3)
Chloride: 109 mmol/L (ref 98–111)
Creatinine, Ser: 1.91 mg/dL — ABNORMAL HIGH (ref 0.61–1.24)
GFR, Estimated: 36 mL/min — ABNORMAL LOW (ref >60)
Glucose, Bld: 100 mg/dL — ABNORMAL HIGH (ref 70–99)
Potassium: 4.4 mmol/L (ref 3.5–5.1)
Sodium: 138 mmol/L (ref 135–145)

## 2020-12-19 LAB — MAGNESIUM: Magnesium: 2 mg/dL (ref 1.7–2.4)

## 2020-12-23 ENCOUNTER — Telehealth: Payer: Self-pay | Admitting: *Deleted

## 2020-12-23 NOTE — Telephone Encounter (Signed)
-----   Message from Arnoldo Lenis, MD sent at 12/22/2020  3:49 PM EDT ----- Slight worsening of renal function, can we lower his lasix even further to 20mg  daily. May take additional 20mg  as needed for leg swelling of SOB. Repeat BMET in 2 weeks.   Zandra Abts MD

## 2020-12-24 MED ORDER — METOPROLOL SUCCINATE ER 25 MG PO TB24: 25.0000 mg | ORAL_TABLET | Freq: Every day | ORAL | 3 refills | Status: DC

## 2020-12-24 MED ORDER — FUROSEMIDE 20 MG PO TABS: 20.0000 mg | ORAL_TABLET | Freq: Every day | ORAL | 3 refills | Status: DC

## 2020-12-24 NOTE — Telephone Encounter (Signed)
Pt's wife [name] notified.

## 2020-12-24 NOTE — Telephone Encounter (Signed)
Pt's wife is returning call °

## 2020-12-26 ENCOUNTER — Other Ambulatory Visit: Payer: Self-pay | Admitting: *Deleted

## 2020-12-26 DIAGNOSIS — I10 Essential (primary) hypertension: Secondary | ICD-10-CM

## 2021-01-08 ENCOUNTER — Other Ambulatory Visit (HOSPITAL_COMMUNITY)
Admission: RE | Admit: 2021-01-08 | Discharge: 2021-01-08 | Disposition: A | Discharge status: Home / Self Care | Payer: Medicare Other | Source: Ambulatory Visit | Attending: Cardiology | Admitting: Cardiology

## 2021-01-08 ENCOUNTER — Other Ambulatory Visit: Payer: Self-pay

## 2021-01-08 ENCOUNTER — Encounter: Payer: Self-pay | Admitting: Nurse Practitioner

## 2021-01-08 ENCOUNTER — Other Ambulatory Visit: Payer: Self-pay | Admitting: Nurse Practitioner

## 2021-01-08 DIAGNOSIS — I5022 Chronic systolic (congestive) heart failure: Secondary | ICD-10-CM | POA: Diagnosis not present

## 2021-01-08 DIAGNOSIS — Z515 Encounter for palliative care: Secondary | ICD-10-CM | POA: Diagnosis not present

## 2021-01-08 DIAGNOSIS — R0602 Shortness of breath: Secondary | ICD-10-CM

## 2021-01-08 DIAGNOSIS — I10 Essential (primary) hypertension: Secondary | ICD-10-CM | POA: Insufficient documentation

## 2021-01-08 LAB — BASIC METABOLIC PANEL WITH GFR
Anion gap: 5 (ref 5–15)
BUN: 24 mg/dL — ABNORMAL HIGH (ref 8–23)
CO2: 23 mmol/L (ref 22–32)
Calcium: 9.2 mg/dL (ref 8.9–10.3)
Chloride: 110 mmol/L (ref 98–111)
Creatinine, Ser: 1.82 mg/dL — ABNORMAL HIGH (ref 0.61–1.24)
GFR, Estimated: 38 mL/min — ABNORMAL LOW (ref >60)
Glucose, Bld: 99 mg/dL (ref 70–99)
Potassium: 5.2 mmol/L — ABNORMAL HIGH (ref 3.5–5.1)
Sodium: 138 mmol/L (ref 135–145)

## 2021-01-08 NOTE — Progress Notes (Signed)
[facility] Consult Note Telephone: [phone]  Fax: [phone]    Date of encounter: 01/08/21 PATIENT NAME: Darius Smith [address] [id number]   [phone] (home)  DOB: May 15, 1946 MRN: [id number] PRIMARY CARE PROVIDER:    Perlie Mayo, NP,  [place] [address] [phone] RESPONSIBLE PARTY:    Contact Information    Name Relation Home Work Mobile   Darius, Smith [phone]  [phone]     Due to the COVID-19 crisis, this visit was done via telemedicine from my office and it was initiated and consent by this patient and or family.   Palliative Care was asked to follow this patient by consultation request of  Darius Mayo, NP to address advance care planning and complex medical decision making. This is a follow up visit  ASSESSMENT AND PLAN / RECOMMENDATIONS:   Advance Care Planning/Goals of Care: Goals include to maximize quality of life and symptom management. Our advance care planning conversation included a discussion about:     The value and importance of advance care planning   Experiences with loved ones who have been seriously ill or have died   Exploration of personal, cultural or spiritual beliefs that might influence medical decisions   Exploration of goals of care in the event of a sudden injury or illness   Identification and preparation of a healthcare agent   Review and updating or creation of an  advance directive document .  Decision not to resuscitate or to de-escalate disease focused treatments due to poor prognosis.  CODE STATUS: DNR  Symptom Management/Plan:  1.Advance Care Planning;DNR in vynca  2. Palliative care encounter; Palliative care encounter; Palliative medicine team will continue to support patient, patient's family, and medical team. Visit consisted of counseling and education dealing with the complex and emotionally intense issues  of symptom management and palliative care in the setting of serious and potentially life-threatening illness  3. Shortness of breath secondary to CHF/resolved with improved echo repeated from 20 to 25% to 50%. Continue cardiology visit with appointments at Atrial fibrillation clinic. Discussed continuing to try to see if [facility] does [employer] program through CHF clinic, though not as of yet established as in other [place] counties. Will continue to monitor  Echo 11/26/2020 Left Ventricle: Left ventricular ejection fraction, by estimation, is  50%. The left ventricle has mildly decreased function. The left ventricle  has no regional wall motion abnormalities. The average left ventricular  global longitudinal strain is -18.2  %. The global longitudinal strain is normal. The left ventricular internal  cavity size was mildly dilated. There is no left ventricular hypertrophy.  Left ventricular diastolic parameters are consistent with Grade I  diastolic dysfunction (impaired  relaxation). Normal left ventricular filling pressure.   Follow up Palliative Care Visit: Palliative care will continue to follow for complex medical decision making, advance care planning, and clarification of goals. Return 8 weeks or prn.  I spent 35 minutes providing this consultation. More than 50% of the time in this consultation was spent in counseling and care coordination.  PPS: 50%  HOSPICE ELIGIBILITY/DIAGNOSIS: TBD  Chief Complaint: palliative care for complex medical decision making  HISTORY OF PRESENT ILLNESS:  Darius Smith is a 75 y.o. year old male  with Cardiomegaly with ef 20 to 25%, atrial fibrillation, lymphoma, hypertension, chronic systolic congestive heart failure, gerd, chronic kidney disease, history of kidney stone, arthritis, cholecystectomy, history of  cardioversion.I called Darius Smith for telemedicine telephonic as video not available for follow up PC visit. Darius and Darius. Smith  both in agreement. We talked about how Darius. Smith has been feeling. Darius. Smith endorses he has been doing much better. Symptoms shortness of breath resolved. We talked about recent visits with cardiology. Darius. Smith endorses his quality of life has improved. We talked about appetite good. Darius. Smith continues to measure his calves for edema which has improved. We talked about medical goals of care. We talked about improved blood pressures. We talked about role of PC in POC. Discussed at present time no new recommendation recommended, will continue to monitor. Discussed with echo and symptoms improved, will f/u 2 months or sooner if declines. PC visit scheduled.  History obtained from review of EMR, discussion and interview with wife and  Darius. Smith.  I reviewed available labs, medications, imaging, studies and related documents from the EMR.  Records reviewed and summarized above.   ROS Full 14 system review of systems performed and negative with exception of: as per HPI.  Physical Exam: deferred  Questions and concerns were addressed. The patient/family was encouraged to call with questions and/or concerns.  Provided general support and encouragement, no other unmet needs identified  Thank you for the opportunity to participate in the care of Darius. Smith.  The palliative care team will continue to follow. Please call our office at [phone] if we can be of additional assistance.   This chart was dictated using voice recognition software. Despite best efforts to proofread, errors can occur which can change the documentation meaning.  Darius Zagal [name], NP , MSN, [facility]

## 2021-01-12 ENCOUNTER — Telehealth: Payer: Self-pay | Admitting: Cardiology

## 2021-01-12 MED ORDER — METOPROLOL SUCCINATE ER 25 MG PO TB24: 25.0000 mg | ORAL_TABLET | Freq: Every day | ORAL | 3 refills | Status: DC

## 2021-01-12 MED ORDER — HYDRALAZINE HCL 100 MG PO TABS: 100.0000 mg | ORAL_TABLET | Freq: Three times a day (TID) | ORAL | 3 refills | Status: DC

## 2021-01-12 NOTE — Telephone Encounter (Signed)
New message    potassium chloride SA (KLOR-CON) 20 MEQ tablet -patient had blood work last week  - should they stop it his blood work was a little high       his bp yesterday was 107/58 then 130/60 , what is too low ?  Can he get the 2nd Covid booster? Its been 6 months since his last booster?   *STAT* If patient is at the pharmacy, call can be transferred to refill team.   1. Which medications need to be refilled? (please list name of each medication and dose if known) hydrALAZINE (APRESOLINE) 100 MG tablet  metoprolol succinate (TOPROL XL) 25 MG 24 hr tablet  2. Which pharmacy/location (including street and city if local pharmacy) is medication to be sent to? [facility]    3. Do they need a 30 day or 90 day supply?  [place]

## 2021-01-12 NOTE — Telephone Encounter (Signed)
Verbalized to patient's wife that Dr. Harl Bowie has not resulted patient's lab work yet, but once it has been done, we will give them a call.   Verbalized to spouse that patient's bp is at his goal per Dr. Nelly Laurence last note.   Patient may get covid booster.   Medication refill request approved for Hydralazine 100 mg tablets and Toprol XL 25 mg 24 hr tablets and sent to [facility].

## 2021-01-20 ENCOUNTER — Telehealth: Payer: Self-pay

## 2021-01-20 DIAGNOSIS — Z79899 Other long term (current) drug therapy: Secondary | ICD-10-CM

## 2021-01-20 NOTE — Telephone Encounter (Signed)
Spouse of pt verbalized understanding. Orders placed.

## 2021-01-20 NOTE — Telephone Encounter (Signed)
-----   Message from Arnoldo Lenis, MD sent at 01/19/2021 12:40 PM EDT ----- Labs show mild improvement in renal function. Would repeat a bmet in 2 weeks to follow monitor the trend. Potassium just slightly elevated but not to a level to warrant any changes at this time   Zandra Abts MD

## 2021-01-27 ENCOUNTER — Other Ambulatory Visit: Payer: Self-pay | Admitting: Cardiology

## 2021-01-27 ENCOUNTER — Ambulatory Visit: Payer: Medicare Other | Admitting: Nurse Practitioner

## 2021-01-28 ENCOUNTER — Inpatient Hospital Stay (HOSPITAL_COMMUNITY): Payer: Medicare Other | Attending: Hematology

## 2021-01-28 ENCOUNTER — Other Ambulatory Visit: Payer: Self-pay

## 2021-01-28 DIAGNOSIS — C8284 Other types of follicular lymphoma, lymph nodes of axilla and upper limb: Secondary | ICD-10-CM | POA: Insufficient documentation

## 2021-01-28 DIAGNOSIS — D649 Anemia, unspecified: Secondary | ICD-10-CM | POA: Insufficient documentation

## 2021-01-28 DIAGNOSIS — C8294 Follicular lymphoma, unspecified, lymph nodes of axilla and upper limb: Secondary | ICD-10-CM

## 2021-01-28 LAB — CBC WITH DIFFERENTIAL/PLATELET
Abs Immature Granulocytes: 0.06 10*3/uL (ref 0.00–0.07)
Basophils Absolute: 0 10*3/uL (ref 0.0–0.1)
Basophils Relative: 0 %
Eosinophils Absolute: 0.2 10*3/uL (ref 0.0–0.5)
Eosinophils Relative: 2 %
HCT: 38 % — ABNORMAL LOW (ref 39.0–52.0)
Hemoglobin: 12.1 g/dL — ABNORMAL LOW (ref 13.0–17.0)
Immature Granulocytes: 1 %
Lymphocytes Relative: 21 %
Lymphs Abs: 1.8 10*3/uL (ref 0.7–4.0)
MCH: 30.4 pg (ref 26.0–34.0)
MCHC: 31.8 g/dL (ref 30.0–36.0)
MCV: 95.5 fL (ref 80.0–100.0)
Monocytes Absolute: 0.7 10*3/uL (ref 0.1–1.0)
Monocytes Relative: 9 %
Neutro Abs: 5.7 10*3/uL (ref 1.7–7.7)
Neutrophils Relative %: 67 %
Platelets: 254 10*3/uL (ref 150–400)
RBC: 3.98 MIL/uL — ABNORMAL LOW (ref 4.22–5.81)
RDW: 14 % (ref 11.5–15.5)
WBC: 8.4 10*3/uL (ref 4.0–10.5)
nRBC: 0 % (ref 0.0–0.2)

## 2021-01-28 LAB — COMPREHENSIVE METABOLIC PANEL
ALT: 22 U/L (ref 0–44)
AST: 16 U/L (ref 15–41)
Albumin: 3.5 g/dL (ref 3.5–5.0)
Alkaline Phosphatase: 57 U/L (ref 38–126)
Anion gap: 6 (ref 5–15)
BUN: 29 mg/dL — ABNORMAL HIGH (ref 8–23)
CO2: 20 mmol/L — ABNORMAL LOW (ref 22–32)
Calcium: 8.6 mg/dL — ABNORMAL LOW (ref 8.9–10.3)
Chloride: 110 mmol/L (ref 98–111)
Creatinine, Ser: 1.84 mg/dL — ABNORMAL HIGH (ref 0.61–1.24)
GFR, Estimated: 38 mL/min — ABNORMAL LOW (ref >60)
Glucose, Bld: 110 mg/dL — ABNORMAL HIGH (ref 70–99)
Potassium: 4.3 mmol/L (ref 3.5–5.1)
Sodium: 136 mmol/L (ref 135–145)
Total Bilirubin: 0.7 mg/dL (ref 0.3–1.2)
Total Protein: 6.9 g/dL (ref 6.5–8.1)

## 2021-01-28 LAB — URIC ACID: Uric Acid, Serum: 6.7 mg/dL (ref 3.7–8.6)

## 2021-01-28 LAB — LACTATE DEHYDROGENASE: LDH: 100 U/L (ref 98–192)

## 2021-02-03 ENCOUNTER — Other Ambulatory Visit: Payer: Self-pay

## 2021-02-03 ENCOUNTER — Other Ambulatory Visit (HOSPITAL_COMMUNITY)
Admission: RE | Admit: 2021-02-03 | Discharge: 2021-02-03 | Disposition: A | Discharge status: Home / Self Care | Payer: Medicare Other | Source: Ambulatory Visit | Attending: Cardiology | Admitting: Cardiology

## 2021-02-03 DIAGNOSIS — Z79899 Other long term (current) drug therapy: Secondary | ICD-10-CM | POA: Diagnosis not present

## 2021-02-03 LAB — BASIC METABOLIC PANEL
Anion gap: 9 (ref 5–15)
BUN: 31 mg/dL — ABNORMAL HIGH (ref 8–23)
CO2: 23 mmol/L (ref 22–32)
Calcium: 9.1 mg/dL (ref 8.9–10.3)
Chloride: 105 mmol/L (ref 98–111)
Creatinine, Ser: 1.95 mg/dL — ABNORMAL HIGH (ref 0.61–1.24)
GFR, Estimated: 35 mL/min — ABNORMAL LOW (ref >60)
Glucose, Bld: 113 mg/dL — ABNORMAL HIGH (ref 70–99)
Potassium: 4.6 mmol/L (ref 3.5–5.1)
Sodium: 137 mmol/L (ref 135–145)

## 2021-02-03 NOTE — Progress Notes (Signed)
[address], [place] [address]   CLINIC:  Medical Oncology/Hematology  PCP:  Perlie Mayo, NP [address] / [place] [address] [phone]   REASON FOR VISIT:  Follow-up for follicular lymphoma  PRIOR THERAPY: none  NGS Results: not done  CURRENT THERAPY: observation  BRIEF ONCOLOGIC HISTORY:  Oncology History   No history exists.    CANCER STAGING: Cancer Staging No matching staging information was found for the patient.  INTERVAL HISTORY:  Mr. JAMAREE HOSIER, a 75 y.o. male, returns for routine follow-up of his follicular lymphoma. [place] was last seen on 09/30/2020.   Today he reports feeling well. He reported to the ER for a high BP. He denies any weight loss or new cardiac issues. He denies any bloody or black stools. He reports high levels of physical activity. He has to get up to use the bathroom 3-4 times a night.   REVIEW OF SYSTEMS:  Review of Systems  Constitutional: Positive for fatigue (75%). Negative for appetite change.  Gastrointestinal: Negative for blood in stool.  Genitourinary: Positive for difficulty urinating (increased frequency at night).   All other systems reviewed and are negative.   PAST MEDICAL/SURGICAL HISTORY:  Past Medical History:  Diagnosis Date  . Allergic rhinitis 07/02/2019  . Arthritis    both hands  . Atrial fibrillation ([facility]) 10/28/2017   Dr. Harl Bowie  . Atrial fibrillation with RVR ([facility]) 07/14/2020  . Blood in urine    occ  . Bronchitis   . Cancer ([facility])    Basal cell  . Cardiomegaly 10/28/2017  . Complication of anesthesia    Mr. Cozart developed a. fib in recovery after cysto procedure 10/2017 was transferred to [facility] and underwent successful cardioversion  . Diverticulosis of sigmoid colon 08/2017   Noted on colonoscopy  . Dysrhythmia   . External hemorrhoids 08/2017  . GERD (gastroesophageal reflux disease)   . History of colon polyps 08/2017  . History of kidney stones    . History of rectal bleeding   . History of right inguinal hernia   . Hypertension   . Lymphoma ([facility])   . Nephrolithiasis 07/02/2019  . Rectal bleeding 09/02/2017   Added automatically from request for surgery [id number]  . Shortness of breath    Past Surgical History:  Procedure Laterality Date  . CARDIOVERSION     10/2017  . CARDIOVERSION N/A 07/16/2020   Procedure: CARDIOVERSION;  Surgeon: Arnoldo Lenis, MD;  [facility]: [facility];  Service: Endoscopy;  Laterality: N/A;  . CARDIOVERSION N/A 08/21/2020   Procedure: CARDIOVERSION;  Surgeon: Satira Sark, MD;  Location: [facility] ORS;  Service: Cardiovascular;  Laterality: N/A;  . CARDIOVERSION N/A 09/10/2020   Procedure: CARDIOVERSION;  Surgeon: Josue Hector, MD;  Location[facility] ENDOSCOPY;  Service: Cardiovascular;  Laterality: N/A;  . CHOLECYSTECTOMY    . 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 CLEFT PALATE REPAIR     several from childhood til 75 years old  . COLONOSCOPY  04/28/2011   Procedure: COLONOSCOPY;  Surgeon: Rogene Houston, MD;  Location: [facility];  Service: Endoscopy;  Laterality: N/A;  . COLONOSCOPY N/A 07/18/2014   Procedure: COLONOSCOPY;  Surgeon: Rogene Houston, MD;  Location: [facility];  Service: Endoscopy;  Laterality: N/A;  830  . COLONOSCOPY N/A 09/09/2017   Procedure: COLONOSCOPY;  Surgeon: Rogene Houston, MD;  Location: [facility];  Service: Endoscopy;  Laterality: N/A;  955  . CYSTOSCOPY/URETEROSCOPY/HOLMIUM LASER/STENT PLACEMENT Bilateral 12/06/2017   Procedure: CYSTOSCOPY/RETROGRADE/URETEROSCOPY/HOLMIUM LASER/STENT EXCHANGE;  Surgeon: Festus Aloe, MD;  Location: [facility] ORS;  Service: Urology;  Laterality: Bilateral;  ONLY NEEDS 60 MIN  . HERNIA REPAIR    . POLYPECTOMY  09/09/2017   Procedure: POLYPECTOMY;  Surgeon: Rogene Houston, MD;  Location: [facility];  Service: Endoscopy;;  colon  . vocal cord surgery      SOCIAL HISTORY:  Social History    Socioeconomic History  . Marital status: Married    Spouse name: Not on file  . Number of children: 2  . Years of education: Not on file  . Highest education level: Not on file  Occupational History  . Occupation: retired  Tobacco Use  . Smoking status: Never Smoker  . Smokeless tobacco: Never Used  Vaping Use  . Vaping Use: Never used  Substance and Sexual Activity  . Alcohol use: Not Currently    Alcohol/week: 7.0 standard drinks    Types: 7 Cans of beer per week  . Drug use: No  . Sexual activity: Not on file  Other Topics Concern  . Not on file  Social History Narrative   Lives with wife [name] of 64 years April 15, 2020      2 children, 2 grandchildren   Dog: Gardner Candle      Enjoys: being outdoors       Diet: eats all food groups    Caffeine: drink coffee in the morning, some tea, diet dr pepper   Water: 3-4 cups daily         Wears seat belt    Does not use phone while driving   Polo put up not loaded          Social Determinants of Radio broadcast assistant Strain: Low Risk   . Difficulty of Paying Living Expenses: Not hard at all  Food Insecurity: No Food Insecurity  . Worried About Charity fundraiser in the Last Year: Never true  . Ran Out of Food in the Last Year: Never true  Transportation Needs: No Transportation Needs  . Lack of Transportation (Medical): No  . Lack of Transportation (Non-Medical): No  Physical Activity: Sufficiently Active  . Days of Exercise per Week: 7 days  . Minutes of Exercise per Session: 60 min  Stress: No Stress Concern Present  . Feeling of Stress : Not at all  Social Connections: Moderately Integrated  . Frequency of Communication with Friends and Family: More than three times a week  . Frequency of Social Gatherings with Friends and Family: Twice a week  . Attends Religious Services: More than 4 times per year  . Active Member of Clubs or Organizations: No  . Attends Theatre manager Meetings: Never  . Marital Status: Married  Human resources officer Violence: Not At Risk  . Fear of Current or Ex-Partner: No  . Emotionally Abused: No  . Physically Abused: No  . Sexually Abused: No    FAMILY HISTORY:  Family History  Problem Relation Age of Onset  . Breast cancer Mother   . Prostate cancer Father   . COPD Sister     CURRENT MEDICATIONS:  Current Outpatient Medications  Medication Sig Dispense Refill  . acetaminophen (TYLENOL) 500 MG tablet Take 1,000 mg by mouth every 6 (six) hours as needed for moderate pain or headache.    Marland Kitchen amiodarone (  PACERONE) 200 MG tablet Take 1 tablet (200 mg total) by mouth daily. 60 tablet 0  . amLODipine (NORVASC) 10 MG tablet Take 1 tablet (10 mg total) by mouth daily. 90 tablet 3  . cetirizine (ZYRTEC) 10 MG tablet Take 1 tablet (10 mg total) by mouth daily as needed for allergies. 90 tablet 2  . ELIQUIS 5 MG TABS tablet TAKE ONE TABLET BY MOUTH 2 TIMES A DAY. 60 tablet 5  . furosemide (LASIX) 20 MG tablet Take 1 tablet (20 mg total) by mouth daily. May take an extra 20 mg for swelling or SOB. 110 tablet 3  . hydrALAZINE (APRESOLINE) 100 MG tablet Take 1 tablet (100 mg total) by mouth 3 (three) times daily. 90 tablet 3  . isosorbide mononitrate (IMDUR) 30 MG 24 hr tablet TAKE 1/2 TABLET BY MOUTH ONCE DAILY. 90 tablet 3  . metoprolol succinate (TOPROL XL) 25 MG 24 hr tablet Take 1 tablet (25 mg total) by mouth daily. 90 tablet 3  . omeprazole (PRILOSEC) 20 MG capsule     . potassium chloride SA (KLOR-CON) 20 MEQ tablet Take 1 tablet (20 mEq total) by mouth daily. 90 tablet 3  . spironolactone (ALDACTONE) 25 MG tablet TAKE 1 TABLET BY MOUTH ONCE DAILY. 90 tablet 0  . VENTOLIN HFA 108 (90 Base) MCG/ACT inhaler Inhale 2 puffs into the lungs every 6 (six) hours as needed for wheezing or shortness of breath. 18 g 3   No current facility-administered medications for this visit.    ALLERGIES:  Allergies  Allergen Reactions  .  Augmentin [Amoxicillin-Pot Clavulanate] Other (See Comments)    Increased liver enzymes Has patient had a PCN reaction causing immediate rash, facial/tongue/throat swelling, SOB or lightheadedness with hypotension: No Has patient had a PCN reaction causing severe rash involving mucus membranes or skin necrosis: No Has patient had a PCN reaction that required hospitalization: No Has patient had a PCN reaction occurring within the last 10 years: No  If all of the above answers are "NO", then may proceed with Cephalosporin use.   Marland Kitchen Flomax [Tamsulosin Hcl] Nausea Only    Dizziness  Pt is on flomax    PHYSICAL EXAM:  Performance status (ECOG): 0 - Asymptomatic  There were no vitals filed for this visit. Wt Readings from Last 3 Encounters:  12/18/20 185 lb 9.6 oz (84.2 kg)  12/17/20 185 lb 6.4 oz (84.1 kg)  11/18/20 181 lb (82.1 kg)   Physical Exam Vitals reviewed.  Constitutional:      Appearance: Normal appearance.  Cardiovascular:     Rate and Rhythm: Normal rate and regular rhythm.     Pulses: Normal pulses.     Heart sounds: Normal heart sounds.  Pulmonary:     Effort: Pulmonary effort is normal.     Breath sounds: Normal breath sounds.  Chest:  Breasts:     Right: No axillary adenopathy or supraclavicular adenopathy.     Left: Axillary adenopathy (large) present. No supraclavicular adenopathy.    Abdominal:     Palpations: Abdomen is soft. There is no hepatomegaly, splenomegaly or mass.     Tenderness: There is no abdominal tenderness.  Musculoskeletal:     Right lower leg: No edema.     Left lower leg: No edema.  Lymphadenopathy:     Cervical: No cervical adenopathy.     Right cervical: No superficial cervical adenopathy.    Left cervical: No superficial cervical adenopathy.     Upper Body:  Right upper body: No supraclavicular, axillary or pectoral adenopathy.     Left upper body: Axillary adenopathy (large) present. No supraclavicular or pectoral adenopathy.   Neurological:     General: No focal deficit present.     Mental Status: He is alert and oriented to person, place, and time.  Psychiatric:        Mood and Affect: Mood normal.        Behavior: Behavior normal.      LABORATORY DATA:  I have reviewed the labs as listed.  CBC Latest Ref Rng & Units 01/28/2021 11/02/2020 09/23/2020  WBC 4.0 - 10.5 K/uL 8.4 7.0 7.7  Hemoglobin 13.0 - 17.0 g/dL 12.1(L) 13.6 13.9  Hematocrit 39.0 - 52.0 % 38.0(L) 43.0 45.5  Platelets 150 - 400 K/uL 254 214 276   CMP Latest Ref Rng & Units 02/03/2021 01/28/2021 01/08/2021  Glucose 70 - 99 mg/dL 113(H) 110(H) 99  BUN 8 - 23 mg/dL 31(H) 29(H) 24(H)  Creatinine 0.61 - 1.24 mg/dL 1.95(H) 1.84(H) 1.82(H)  Sodium 135 - 145 mmol/L 137 136 138  Potassium 3.5 - 5.1 mmol/L 4.6 4.3 5.2(H)  Chloride 98 - 111 mmol/L 105 110 110  CO2 22 - 32 mmol/L 23 20(L) 23  Calcium 8.9 - 10.3 mg/dL 9.1 8.6(L) 9.2  Total Protein 6.5 - 8.1 g/dL - 6.9 -  Total Bilirubin 0.3 - 1.2 mg/dL - 0.7 -  Alkaline Phos 38 - 126 U/L - 57 -  AST 15 - 41 U/L - 16 -  ALT 0 - 44 U/L - 22 -    DIAGNOSTIC IMAGING:  I have independently reviewed the scans and discussed with the patient. No results found.   ASSESSMENT:  1. 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 Diona Fanti. -He had an abdominal CT on 04/25/2020 for work-up of kidney stone. This showed T11 lesion. -MRI of the thoracic spine on 05/23/2020 showed metastatic lesions throughout the thoracic spine, most advanced involvement on the right at T11 with extraosseous tumor encroaching upon the intervertebral foramina on the right to compress the right T11 nerve. -MRI of the abdomen with and without contrast on 06/05/2020 showed no evidence of intra-abdominal mass or lymphadenopathy. No liver lesions noted. 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 -Left axillary lymph node biopsy on 06/05/2020 consistent with follicular lymphoma,  morphologically low-grade (grade 1-2) is favored although focally elevated Ki-67 can sometimes indicate more aggressive behavior. IHC positive for CD20, CD79a, CD10, BCL6 and BCL2. Ki-67 is overall low, but does have foci of increased proliferation. -PET CT scan on 06/23/2020 showed multiple small hypermetabolic lymph nodes in the neck, maximum SUV 11.4. Left axillary adenopathy 4.7 x 4.6 cm with SUV 16.8. Few subcentimeter lymph nodes in the small bowel mesentery with SUV 4.7. Mild hypermetabolic lymphadenopathy in both inguinal regions with SUV 8.8. Multiple hypermetabolic bone lesions throughout the spine and pelvis involving both hips.  2. Social/family history: -He never smoked. He worked as a Furniture conservator/restorer. No exposure to chemicals. -Father died of prostate cancer and mother had breast cancer. 2 paternal aunts had breast cancer and one paternal uncle had lung cancer.   PLAN:  1.Stage IVa follicular lymphoma: -He does not have any fevers, night sweats or weight loss.  No recurrent infections. - Physical examination today reveals lymph node palpable under the left axilla.  Very small lymph node in the left neck posterior triangle.  No splenomegaly. - Reviewed labs from 01/28/2021 which showed  white count 8.4 and platelet count 254.  Hemoglobin slightly decreased to 12.1.  Creatinine is 1.84 and rest of the LFTs are normal.  LDH was normal. - Recommend follow-up in 4 months with repeat labs and physical exam.  2.  Normocytic anemia: - Likely from CKD.  We will check ferritin and iron panel at next visit.   Orders placed this encounter:  No orders of the defined types were placed in this encounter.    Derek Jack, MD [place] [phone]   I, Thana Ates, am acting as a scribe for Dr. Derek Jack.  I, Derek Jack MD, have reviewed the above documentation for accuracy and completeness, and I agree with the above.

## 2021-02-04 ENCOUNTER — Inpatient Hospital Stay (HOSPITAL_COMMUNITY): Payer: Medicare Other | Admitting: Hematology

## 2021-02-04 VITALS — BP 118/72 | HR 66 | Temp 97.1°F | Resp 18 | Wt 189.3 lb

## 2021-02-04 DIAGNOSIS — C8284 Other types of follicular lymphoma, lymph nodes of axilla and upper limb: Secondary | ICD-10-CM | POA: Diagnosis not present

## 2021-02-04 DIAGNOSIS — D649 Anemia, unspecified: Secondary | ICD-10-CM | POA: Diagnosis not present

## 2021-02-04 DIAGNOSIS — C8294 Follicular lymphoma, unspecified, lymph nodes of axilla and upper limb: Secondary | ICD-10-CM

## 2021-02-04 NOTE — Patient Instructions (Signed)
[facility] °Discharge Instructions ° °You were seen today by Dr. Katragadda. He went over your recent results. Dr. Katragadda will see you back in 4 months for labs and follow up. ° ° °Thank you for choosing [facility] at  [facility]ospital to provide your oncology and hematology care.  To afford each patient quality time with our provider, please arrive at least 15 minutes before your scheduled appointment time.  ° °If you have a lab appointment with the Cancer Center please come in thru the Main Entrance and check in at the main information desk ° °You need to re-schedule your appointment should you arrive 10 or more minutes late.  We strive to give you quality time with our providers, and arriving late affects you and other patients whose appointments are after yours.  Also, if you no show three or more times for appointments you may be dismissed from the clinic at the providers discretion.     °Again, thank you for choosing  Cancer Center.  Our hope is that these requests will decrease the amount of time that you wait before being seen by our physicians.       °_____________________________________________________________ ° °Should you have questions after your visit to  Cancer Center, please contact our office at [phone] between the hours of 8:00 a.m. and 4:30 p.m.  Voicemails left after 4:00 p.m. will not be returned until the following business day.  For prescription refill requests, have your pharmacy contact our office and allow 72 hours.   ° °Cancer Center Support Programs:  ° °> Cancer Support Group  °2nd Tuesday of the month 1pm-2pm, Journey Room  ° ° °

## 2021-02-11 DIAGNOSIS — I10 Essential (primary) hypertension: Secondary | ICD-10-CM | POA: Diagnosis not present

## 2021-02-12 LAB — LIPID PANEL WITH LDL/HDL RATIO
Cholesterol, Total: 173 mg/dL (ref 100–199)
HDL: 49 mg/dL (ref >39)
LDL Chol Calc (NIH): 104 mg/dL — ABNORMAL HIGH (ref 0–99)
LDL/HDL Ratio: 2.1 ratio (ref 0.0–3.6)
Triglycerides: 110 mg/dL (ref 0–149)
VLDL Cholesterol Cal: 20 mg/dL (ref 5–40)

## 2021-02-12 NOTE — Progress Notes (Signed)
Cholesterol is slightly elevated, but not high enough to add medication.

## 2021-02-17 ENCOUNTER — Telehealth: Payer: Self-pay

## 2021-02-17 DIAGNOSIS — Z79899 Other long term (current) drug therapy: Secondary | ICD-10-CM

## 2021-02-17 NOTE — Telephone Encounter (Signed)
-----   Message from Arnoldo Lenis, MD sent at 02/17/2021 12:25 PM EDT ----- Slight decrease in renal function, continue to monitor at this time. Can he get a repeat bmet/mg in 2 weeks   Zandra Abts MD

## 2021-02-17 NOTE — Telephone Encounter (Signed)
Pt verbalized understanding and agreed to have repeat BMET drawn in 2 weeks.

## 2021-02-18 ENCOUNTER — Encounter: Payer: Self-pay | Admitting: Nurse Practitioner

## 2021-02-18 ENCOUNTER — Ambulatory Visit (INDEPENDENT_AMBULATORY_CARE_PROVIDER_SITE_OTHER): Payer: Medicare Other | Admitting: Nurse Practitioner

## 2021-02-18 ENCOUNTER — Other Ambulatory Visit: Payer: Self-pay

## 2021-02-18 DIAGNOSIS — C8258 Diffuse follicle center lymphoma, lymph nodes of multiple sites: Secondary | ICD-10-CM | POA: Diagnosis not present

## 2021-02-18 DIAGNOSIS — I10 Essential (primary) hypertension: Secondary | ICD-10-CM

## 2021-02-18 NOTE — Assessment & Plan Note (Signed)
-  followed by Dr. Delton Coombes q4 months

## 2021-02-18 NOTE — Patient Instructions (Signed)
Please have fasting labs drawn 2-3 days prior to your appointment so we can discuss the results during your office visit.  

## 2021-02-18 NOTE — Progress Notes (Signed)
Established Patient Office Visit  Subjective:  Patient ID: Darius Smith, male    DOB: 12/05/1945  Age: 75 y.o. MRN: [id number]  CC:  Chief Complaint  Patient presents with  . Hypertension    HPI [name] presents for follow-up. He has hx of HTN, A-fib, and CHF as well as follicular lymphoma.  He sees Dr. Harl Bowie q2 weeks for blood draws to check BMP and check kidneys. He saw Dr. Delton Coombes recently and sees him q4 months.  Has visits with palliative care q2 months, but he states he is feeling fine.  He has been walking 2-5 miles per day depending on the weather.  Past Medical History:  Diagnosis Date  . Allergic rhinitis 07/02/2019  . Arthritis    both hands  . Atrial fibrillation ([place]) 10/28/2017   Dr. Harl Bowie  . Atrial fibrillation with RVR ([place]) 07/14/2020  . Blood in urine    occ  . Bronchitis   . Cancer (H[facility])    Basal cell  . Cardiomegaly 10/28/2017  . Complication of anesthesia    Mr. Offord developed a. fib in recovery after cysto procedure 10/2017 was transferred to [facility] and underwent successful cardioversion  . Diverticulosis of sigmoid colon 08/2017   Noted on colonoscopy  . Dysrhythmia   . External hemorrhoids 08/2017  . GERD (gastroesophageal reflux disease)   . History of colon polyps 08/2017  . History of kidney stones   . History of rectal bleeding   . History of right inguinal hernia   . Hypertension   . Lymphoma ([place])   . Nephrolithiasis 07/02/2019  . Rectal bleeding 09/02/2017   Added automatically from request for surgery [phone]  . Shortness of breath     Past Surgical History:  Procedure Laterality Date  . CARDIOVERSION     10/2017  . CARDIOVERSION N/A 07/16/2020   Procedure: CARDIOVERSION;  Surgeon: Arnoldo Lenis, MD;  Location: [facility];  Service: Endoscopy;  Laterality: N/A;  . CARDIOVERSION N/A 08/21/2020   Procedure: CARDIOVERSION;  Surgeon: Satira Sark, MD;  Location: [facility] ORS;  Service: Cardiovascular;   Laterality: N/A;  . CARDIOVERSION N/A 09/10/2020   Procedure: CARDIOVERSION;  Surgeon: Josue Hector, MD;  Location: [facility] ENDOSCOPY;  Service: Cardiovascular;  Laterality: N/A;  . CHOLECYSTECTOMY    . 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 CLEFT PALATE REPAIR     several from childhood til 75 years old  . COLONOSCOPY  04/28/2011   Procedure: COLONOSCOPY;  Surgeon: Rogene Houston, MD;  Location: [facility];  Service: Endoscopy;  Laterality: N/A;  . COLONOSCOPY N/A 07/18/2014   Procedure: COLONOSCOPY;  Surgeon: Rogene Houston, MD;  Location: [facility];  Service: Endoscopy;  Laterality: N/A;  830  . COLONOSCOPY N/A 09/09/2017   Procedure: COLONOSCOPY;  Surgeon: Rogene Houston, MD;  Location: [facility];  Service: Endoscopy;  Laterality: N/A;  955  . CYSTOSCOPY/URETEROSCOPY/HOLMIUM LASER/STENT PLACEMENT Bilateral 12/06/2017   Procedure: CYSTOSCOPY/RETROGRADE/URETEROSCOPY/HOLMIUM LASER/STENT EXCHANGE;  Surgeon: Festus Aloe, MD;  Location: [facility] ORS;  Service: Urology;  Laterality: Bilateral;  ONLY NEEDS 60 MIN  . HERNIA REPAIR    . POLYPECTOMY  09/09/2017   Procedure: POLYPECTOMY;  Surgeon: Rogene Houston, MD;  Location: [facility];  Service: Endoscopy;;  colon  . vocal cord surgery      Family History  Problem Relation Age of Onset  . Breast cancer Mother   . Prostate  cancer Father   . COPD Sister     Social History   Socioeconomic History  . Marital status: Married    Spouse name: Not on file  . Number of children: 2  . Years of education: Not on file  . Highest education level: Not on file  Occupational History  . Occupation: retired  Tobacco Use  . Smoking status: Never Smoker  . Smokeless tobacco: Never Used  Vaping Use  . Vaping Use: Never used  Substance and Sexual Activity  . Alcohol use: Not Currently    Alcohol/week: 7.0 standard drinks    Types: 7 Cans of beer per week  . Drug use: No  . Sexual activity: Not on file   Other Topics Concern  . Not on file  Social History Narrative   Lives with wife [name] of 40 years April 15, 2020      2 children, 2 grandchildren   Dog: Gardner Candle      Enjoys: being outdoors       Diet: eats all food groups    Caffeine: drink coffee in the morning, some tea, diet dr pepper   Water: 3-4 cups daily         Wears seat belt    Does not use phone while driving   [place] put up not loaded          Social Determinants of Radio broadcast assistant Strain: Low Risk   . Difficulty of Paying Living Expenses: Not hard at all  Food Insecurity: No Food Insecurity  . Worried About Charity fundraiser in the Last Year: Never true  . Ran Out of Food in the Last Year: Never true  Transportation Needs: No Transportation Needs  . Lack of Transportation (Medical): No  . Lack of Transportation (Non-Medical): No  Physical Activity: Sufficiently Active  . Days of Exercise per Week: 7 days  . Minutes of Exercise per Session: 60 min  Stress: No Stress Concern Present  . Feeling of Stress : Not at all  Social Connections: Moderately Integrated  . Frequency of Communication with Friends and Family: More than three times a week  . Frequency of Social Gatherings with Friends and Family: Twice a week  . Attends Religious Services: More than 4 times per year  . Active Member of Clubs or Organizations: No  . Attends Archivist Meetings: Never  . Marital Status: Married  Human resources officer Violence: Not At Risk  . Fear of Current or Ex-Partner: No  . Emotionally Abused: No  . Physically Abused: No  . Sexually Abused: No    Outpatient Medications Prior to Visit  Medication Sig Dispense Refill  . acetaminophen (TYLENOL) 500 MG tablet Take 1,000 mg by mouth every 6 (six) hours as needed for moderate pain or headache.    Marland Kitchen amiodarone (PACERONE) 200 MG tablet Take 1 tablet (200 mg total) by mouth daily. 60 tablet 0  . amLODipine (NORVASC) 10 MG  tablet Take 1 tablet (10 mg total) by mouth daily. 90 tablet 3  . cetirizine (ZYRTEC) 10 MG tablet Take 1 tablet (10 mg total) by mouth daily as needed for allergies. 90 tablet 2  . ELIQUIS 5 MG TABS tablet TAKE ONE TABLET BY MOUTH 2 TIMES A DAY. 60 tablet 5  . furosemide (LASIX) 20 MG tablet Take 1 tablet (20 mg total) by mouth daily. May take an extra 20 mg for swelling or SOB. 110 tablet  3  . hydrALAZINE (APRESOLINE) 100 MG tablet Take 1 tablet (100 mg total) by mouth 3 (three) times daily. 90 tablet 3  . isosorbide mononitrate (IMDUR) 30 MG 24 hr tablet TAKE 1/2 TABLET BY MOUTH ONCE DAILY. 90 tablet 3  . metoprolol succinate (TOPROL XL) 25 MG 24 hr tablet Take 1 tablet (25 mg total) by mouth daily. 90 tablet 3  . omeprazole (PRILOSEC) 20 MG capsule     . potassium chloride SA (KLOR-CON) 20 MEQ tablet Take 1 tablet (20 mEq total) by mouth daily. 90 tablet 3  . spironolactone (ALDACTONE) 25 MG tablet TAKE 1 TABLET BY MOUTH ONCE DAILY. 90 tablet 0  . VENTOLIN HFA 108 (90 Base) MCG/ACT inhaler Inhale 2 puffs into the lungs every 6 (six) hours as needed for wheezing or shortness of breath. 18 g 3   No facility-administered medications prior to visit.    Allergies  Allergen Reactions  . Augmentin [Amoxicillin-Pot Clavulanate] Other (See Comments)    Increased liver enzymes Has patient had a PCN reaction causing immediate rash, facial/tongue/throat swelling, SOB or lightheadedness with hypotension: No Has patient had a PCN reaction causing severe rash involving mucus membranes or skin necrosis: No Has patient had a PCN reaction that required hospitalization: No Has patient had a PCN reaction occurring within the last 10 years: No  If all of the above answers are "NO", then may proceed with Cephalosporin use.   Marland Kitchen Flomax [Tamsulosin Hcl] Nausea Only    Dizziness  Pt is on flomax    ROS Review of Systems  Constitutional: Negative.   Respiratory: Negative.   Cardiovascular: Negative.    Musculoskeletal: Positive for back pain.       Has chronic pain to back and hips, but this is not worse today  Psychiatric/Behavioral: Negative.       Objective:    Physical Exam Constitutional:      Appearance: Normal appearance.  Cardiovascular:     Rate and Rhythm: Regular rhythm. Bradycardia present.     Pulses: Normal pulses.     Heart sounds: Normal heart sounds.  Pulmonary:     Effort: Pulmonary effort is normal.     Breath sounds: Normal breath sounds.  Neurological:     Mental Status: He is alert.  Psychiatric:        Mood and Affect: Mood normal.        Behavior: Behavior normal.        Thought Content: Thought content normal.        Judgment: Judgment normal.     BP 116/69   Pulse (!) 51   Temp 98.1 F (36.7 C)   Resp 18   Ht 6' (1.829 m)   Wt 186 lb (84.4 kg)   SpO2 95%   BMI 25.23 kg/m  Wt Readings from Last 3 Encounters:  02/18/21 186 lb (84.4 kg)  02/04/21 189 lb 4.8 oz (85.9 kg)  12/18/20 185 lb 9.6 oz (84.2 kg)     Health Maintenance Due  Topic Date Due  . Zoster Vaccines- Shingrix (1 of 2) Never done  . PNA vac Low Risk Adult (2 of 2 - PCV13) 06/03/2020  . COLONOSCOPY (Pts 45-47yr Insurance coverage will need to be confirmed)  09/09/2020  . COVID-19 Vaccine (4 - Booster for Pfizer series) 10/03/2020    There are no preventive care reminders to display for this patient.  Lab Results  Component Value Date   TSH 1.156 08/01/2020   Lab Results  Component  Value Date   WBC 8.4 01/28/2021   HGB 12.1 (L) 01/28/2021   HCT 38.0 (L) 01/28/2021   MCV 95.5 01/28/2021   PLT 254 01/28/2021   Lab Results  Component Value Date   NA 137 02/03/2021   K 4.6 02/03/2021   CO2 23 02/03/2021   GLUCOSE 113 (H) 02/03/2021   BUN 31 (H) 02/03/2021   CREATININE 1.95 (H) 02/03/2021   BILITOT 0.7 01/28/2021   ALKPHOS 57 01/28/2021   AST 16 01/28/2021   ALT 22 01/28/2021   PROT 6.9 01/28/2021   ALBUMIN 3.5 01/28/2021   CALCIUM 9.1 02/03/2021    ANIONGAP 9 02/03/2021   Lab Results  Component Value Date   CHOL 173 02/11/2021   Lab Results  Component Value Date   HDL 49 02/11/2021   Lab Results  Component Value Date   LDLCALC 104 (H) 02/11/2021   Lab Results  Component Value Date   TRIG 110 02/11/2021   Lab Results  Component Value Date   CHOLHDL 2.9 07/31/2020   No results found for: HGBA1C    Assessment & Plan:   Problem List Items Addressed This Visit      Cardiovascular and Mediastinum   Essential hypertension    -gets q2 week BMP with Dr. Harl Bowie who is managing his antihypertensives -has asymptomatic bradycardia today with HR 51; no changes to meds since they are managed by cardiology      Relevant Orders   CBC with Differential/Platelet   CMP14+EGFR   Lipid Panel With LDL/HDL Ratio     Other   Follicular lymphoma ([place])    -followed by Dr. Delton Coombes q4 months         No orders of the defined types were placed in this encounter.   Follow-up: Return in about 6 months (around 08/20/2021) for Lab follow-up (HTN).    Noreene Larsson, NP

## 2021-02-18 NOTE — Assessment & Plan Note (Signed)
-  gets q2 week BMP with Dr. Harl Bowie who is managing his antihypertensives -has asymptomatic bradycardia today with HR 51; no changes to meds since they are managed by cardiology

## 2021-02-20 IMAGING — DX DG CHEST 1V PORT
1 series · 1 of 1 positions shown · non-contrast
Comparison: CT chest dated 05/27/2020

CLINICAL DATA: Cough, sneezing, sinus irritation

EXAM:
PORTABLE CHEST 1 VIEW

[chest ap]
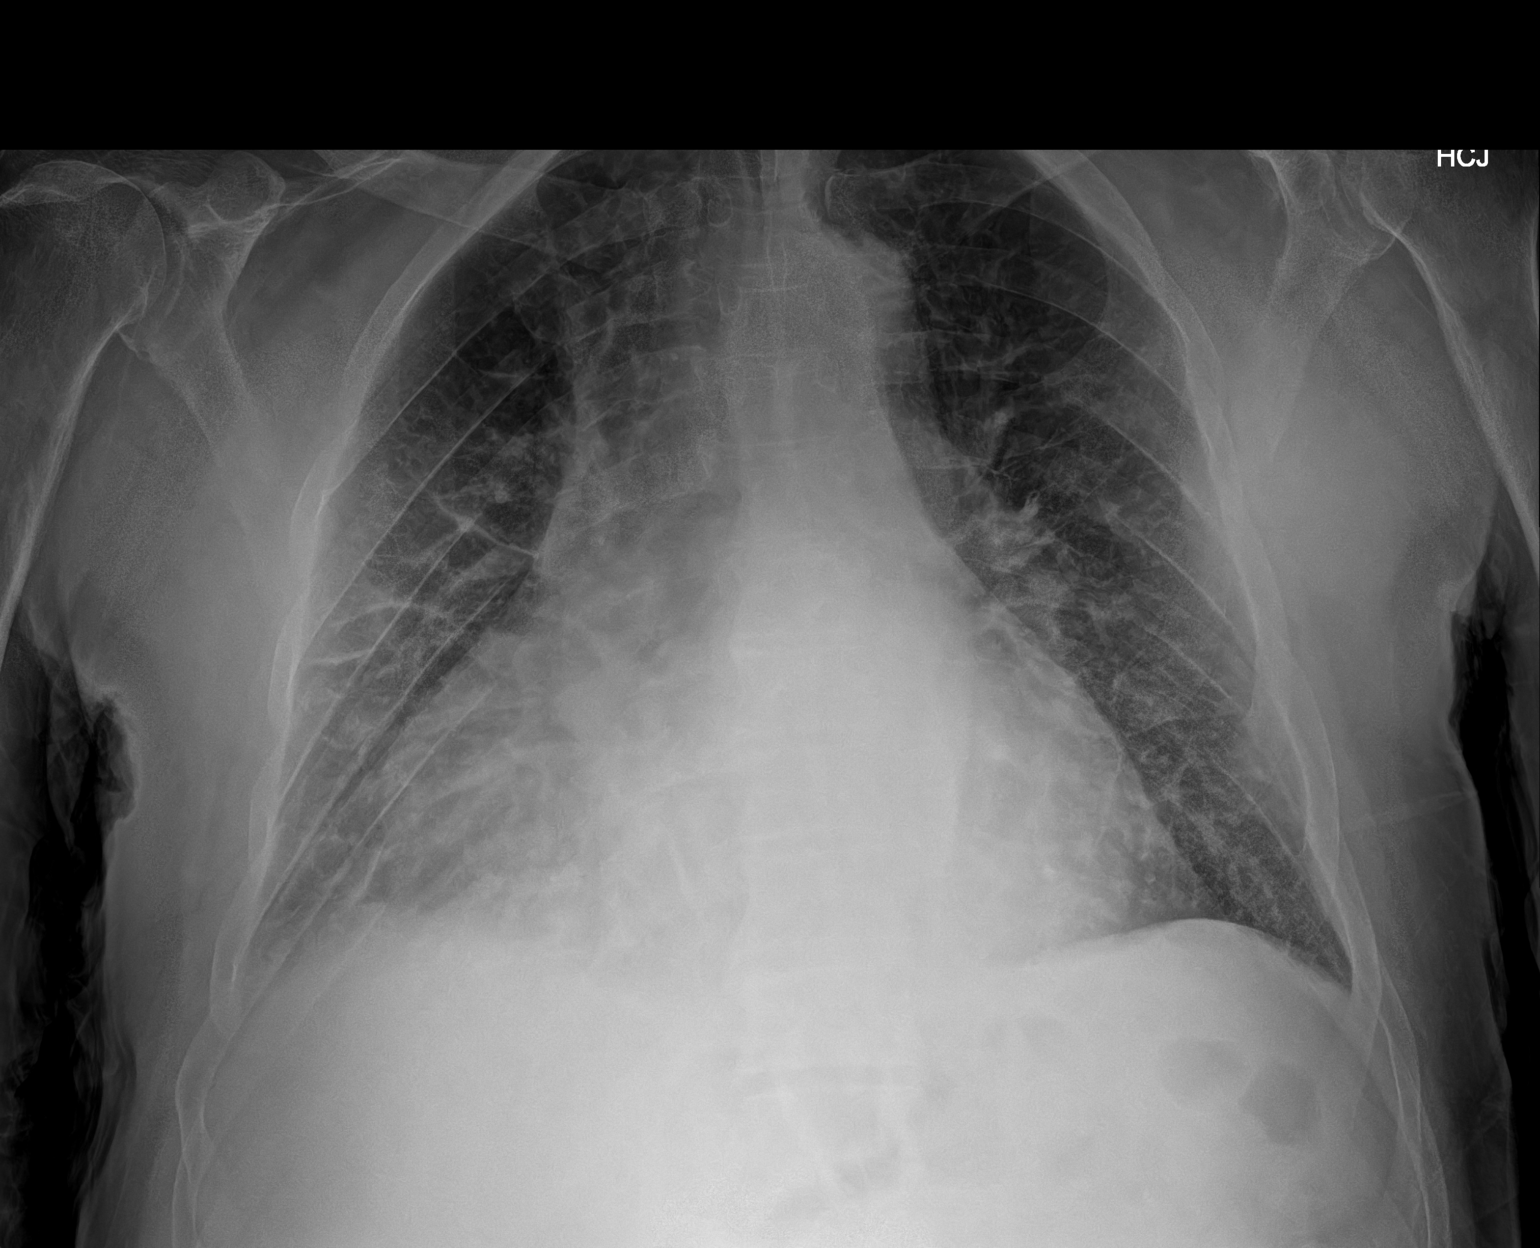

[1 of 1 positions shown; findings below may reference images not displayed]

FINDINGS: Increased interstitial markings in the perihilar lungs, right
greater than left, favoring very mild perihilar edema. Mild right
basilar opacity, favoring a small right pleural effusion with
associated right basilar atelectasis. No pneumothorax.

Cardiomegaly.
IMPRESSION: Cardiomegaly with suspected mild perihilar edema and small right
pleural effusion.

## 2021-02-23 ENCOUNTER — Other Ambulatory Visit (HOSPITAL_COMMUNITY): Payer: Self-pay | Admitting: Physician Assistant

## 2021-03-03 ENCOUNTER — Other Ambulatory Visit: Payer: Self-pay

## 2021-03-03 ENCOUNTER — Other Ambulatory Visit (HOSPITAL_COMMUNITY)
Admission: RE | Admit: 2021-03-03 | Discharge: 2021-03-03 | Disposition: A | Discharge status: Home / Self Care | Payer: Medicare Other | Source: Ambulatory Visit | Attending: Cardiology | Admitting: Cardiology

## 2021-03-03 DIAGNOSIS — Z79899 Other long term (current) drug therapy: Secondary | ICD-10-CM | POA: Diagnosis not present

## 2021-03-03 LAB — BASIC METABOLIC PANEL
Anion gap: 6 (ref 5–15)
BUN: 29 mg/dL — ABNORMAL HIGH (ref 8–23)
CO2: 22 mmol/L (ref 22–32)
Calcium: 8.9 mg/dL (ref 8.9–10.3)
Chloride: 108 mmol/L (ref 98–111)
Creatinine, Ser: 1.86 mg/dL — ABNORMAL HIGH (ref 0.61–1.24)
GFR, Estimated: 37 mL/min — ABNORMAL LOW (ref >60)
Glucose, Bld: 94 mg/dL (ref 70–99)
Potassium: 4.9 mmol/L (ref 3.5–5.1)
Sodium: 136 mmol/L (ref 135–145)

## 2021-03-09 ENCOUNTER — Other Ambulatory Visit: Payer: Self-pay

## 2021-03-09 ENCOUNTER — Ambulatory Visit (INDEPENDENT_AMBULATORY_CARE_PROVIDER_SITE_OTHER): Payer: Medicare Other

## 2021-03-09 DIAGNOSIS — Z0000 Encounter for general adult medical examination without abnormal findings: Secondary | ICD-10-CM

## 2021-03-09 NOTE — Progress Notes (Signed)
Subjective:   Darius Smith is a 75 y.o. male who presents for an Initial Medicare Annual Wellness Visit.  I connected with Darius Smith  today by telephone and verified that I am speaking with the correct person using two identifiers. Location patient: home Location provider: work Persons participating in the virtual visit: patient, provider.   I discussed the limitations, risks, security and privacy concerns of performing an evaluation and management service by telephone and the availability of in person appointments. I also discussed with the patient that there may be a patient responsible charge related to this service. The patient expressed understanding and verbally consented to this telephonic visit.    Interactive audio and video telecommunications were attempted between this provider and patient, however failed, due to patient having technical difficulties OR patient did not have access to video capability.  We continued and completed visit with audio only.     Review of Systems    N/A  Cardiac Risk Factors include: advanced age (>59men, >79 women);male gender;hypertension;dyslipidemia     Objective:    There were no vitals filed for this visit. There is no height or weight on file to calculate BMI.  Advanced Directives 03/09/2021 02/04/2021 11/02/2020 09/30/2020 09/10/2020 08/21/2020 08/19/2020  Does Patient Have a Medical Advance Directive? Yes No No Yes Yes Yes Yes  Type of Paramedic of [place];Living will - - Living will;Healthcare Power of Attorney Living will Living will;Healthcare Power of [place];Living will  Does patient want to make changes to medical advance directive? No - Patient declined - - No - Patient declined - No - Patient declined No - Patient declined  Copy of [place] in Chart? Yes - validated most recent copy scanned in chart (See row information) - - - - No - copy requested No -  copy requested  Would patient like information on creating a medical advance directive? - No - Patient declined No - Patient declined - - - -    Current Medications (verified) Outpatient Encounter Medications as of 03/09/2021  Medication Sig   acetaminophen (TYLENOL) 500 MG tablet Take 1,000 mg by mouth every 6 (six) hours as needed for moderate pain or headache.   amiodarone (PACERONE) 200 MG tablet Take 1 tablet (200 mg total) by mouth daily.   amLODipine (NORVASC) 10 MG tablet Take 1 tablet (10 mg total) by mouth daily.   cetirizine (ZYRTEC) 10 MG tablet Take 1 tablet (10 mg total) by mouth daily as needed for allergies.   ELIQUIS 5 MG TABS tablet TAKE ONE TABLET BY MOUTH 2 TIMES A DAY.   furosemide (LASIX) 20 MG tablet Take 1 tablet (20 mg total) by mouth daily. May take an extra 20 mg for swelling or SOB.   hydrALAZINE (APRESOLINE) 100 MG tablet Take 1 tablet (100 mg total) by mouth 3 (three) times daily.   isosorbide mononitrate (IMDUR) 30 MG 24 hr tablet TAKE 1/2 TABLET BY MOUTH ONCE DAILY.   metoprolol succinate (TOPROL XL) 25 MG 24 hr tablet Take 1 tablet (25 mg total) by mouth daily.   omeprazole (PRILOSEC) 20 MG capsule    potassium chloride SA (KLOR-CON) 20 MEQ tablet Take 1 tablet (20 mEq total) by mouth daily.   spironolactone (ALDACTONE) 25 MG tablet TAKE 1 TABLET BY MOUTH ONCE DAILY.   VENTOLIN HFA 108 (90 Base) MCG/ACT inhaler Inhale 2 puffs into the lungs every 6 (six) hours as needed for wheezing or shortness of  breath.   No facility-administered encounter medications on file as of 03/09/2021.    Allergies (verified) Augmentin [amoxicillin-pot clavulanate] and Flomax [tamsulosin hcl]   History: Past Medical History:  Diagnosis Date   Allergic rhinitis 07/02/2019   Arthritis    both hands   Atrial fibrillation ([place]) 10/28/2017   Dr. 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 Putman developed a. fib in recovery after cysto procedure 10/2017 was transferred to [facility] and underwent successful cardioversion   Diverticulosis of sigmoid colon 08/2017   Noted on colonoscopy   Dysrhythmia    External hemorrhoids 08/2017   GERD (gastroesophageal reflux disease)    History of colon polyps 08/2017   History of kidney stones    History of rectal bleeding    History of right inguinal hernia    Hypertension    Lymphoma ([place])    Nephrolithiasis 07/02/2019   Rectal bleeding 09/02/2017   Added automatically from request for surgery [id number]   Shortness of breath    Past Surgical History:  Procedure Laterality Date   CARDIOVERSION     10/2017   CARDIOVERSION N/A 07/16/2020   Procedure: CARDIOVERSION;  Surgeon: Arnoldo Lenis, MD;  [facility];  Service: Endoscopy;  Laterality: N/A;   CARDIOVERSION N/A 08/21/2020   Procedure: CARDIOVERSION;  Surgeon: Satira Sark, MD;  Location: [facility] ORS;  Service: Cardiovascular;  Laterality: N/A;   CARDIOVERSION N/A 09/10/2020   Procedure: CARDIOVERSION;  Surgeon: Josue Hector, MD;  Location: [facility] ENDOSCOPY;  Service: Cardiovascular;  Laterality: N/A;   CHOLECYSTECTOMY     CLEFT LIP REPAIR     several from childhood til 75 years old   [place]     several from childhood til 75 years old   COLONOSCOPY  04/28/2011   Procedure: COLONOSCOPY;  Surgeon: Rogene Houston, MD;  [facility]: [facility];  Service: Endoscopy;  Laterality: N/A;   COLONOSCOPY N/A 07/18/2014   Procedure: COLONOSCOPY;  Surgeon: Rogene Houston, MD;  [facility];  Service: Endoscopy;  Laterality: N/A;  830   COLONOSCOPY N/A 09/09/2017   Procedure: COLONOSCOPY;  Surgeon: Rogene Houston, MD;  Location: [facility];  Service: Endoscopy;  Laterality: N/A;  955   CYSTOSCOPY/URETEROSCOPY/HOLMIUM LASER/STENT PLACEMENT Bilateral 12/06/2017   Procedure:  CYSTOSCOPY/RETROGRADE/URETEROSCOPY/HOLMIUM LASER/STENT EXCHANGE;  Surgeon: Festus Aloe, MD;  Location: [facility] ORS;  Service: Urology;  Laterality: Bilateral;  ONLY NEEDS 60 MIN   HERNIA REPAIR     POLYPECTOMY  09/09/2017   Procedure: POLYPECTOMY;  Surgeon: Rogene Houston, MD;  Location: [facility];  Service: Endoscopy;;  colon   vocal cord surgery     Family History  Problem Relation Age of Onset   Breast cancer Mother    Prostate cancer Father    COPD Sister    Social History   Socioeconomic History   Marital status: Married    Spouse name: Not on file   Number of children: 2   Years of education: Not on file   Highest education level: Not on file  Occupational History   Occupation: retired  Tobacco Use   Smoking status: Never   Smokeless tobacco: Never  Vaping Use   Vaping Use: Never used  Substance and Sexual Activity   Alcohol use:  Not Currently    Alcohol/week: 7.0 standard drinks    Types: 7 Cans of beer per week   Drug use: No   Sexual activity: Not on file  Other Topics Concern   Not on file  Social History Narrative   Lives with wife Darius Smith of 32 years April 15, 2020      2 children, 2 grandchildren   Dog: Gardner Candle      Enjoys: being outdoors       Diet: eats all food groups    Caffeine: drink coffee in the morning, some tea, diet dr pepper   Water: 3-4 cups daily         Wears seat belt    Does not use phone while driving   [place] put up not loaded          Social Determinants of Radio broadcast assistant Strain: Low Risk    Difficulty of Paying Living Expenses: Not hard at all  Food Insecurity: No Food Insecurity   Worried About Charity fundraiser in the Last Year: Never true   Arboriculturist in the Last Year: Never true  Transportation Needs: No Transportation Needs   Lack of Transportation (Medical): No   Lack of Transportation (Non-Medical): No  Physical Activity: Sufficiently Active   Days of Exercise  per Week: 7 days   Minutes of Exercise per Session: 50 min  Stress: No Stress Concern Present   Feeling of Stress : Not at all  Social Connections: Moderately Integrated   Frequency of Communication with Friends and Family: More than three times a week   Frequency of Social Gatherings with Friends and Family: More than three times a week   Attends Religious Services: More than 4 times per year   Active Member of [employer] or Organizations: No   Attends Music therapist: Never   Marital Status: Married    Tobacco Counseling Counseling given: Not Answered   Clinical Intake:  Pre-visit preparation completed: Yes  Pain : No/denies pain     Nutritional Risks: None Diabetes: No  How often do you need to have someone help you when you read instructions, pamphlets, or other written materials from your doctor or pharmacy?: 1 - Never  Diabetic?No   Interpreter Needed?: No  Information entered by :: [place] of Daily Living In your present state of health, do you have any difficulty performing the following activities: 03/09/2021 08/19/2020  Hearing? N N  Vision? N N  Difficulty concentrating or making decisions? N N  Walking or climbing stairs? N Y  Dressing or bathing? N N  Doing errands, shopping? N N  Preparing Food and eating ? N -  Using the Toilet? N -  In the past six months, have you accidently leaked urine? N -  Do you have problems with loss of bowel control? N -  Managing your Medications? N -  Managing your Finances? N -  Housekeeping or managing your Housekeeping? N -  Some recent data might be hidden    Patient Care Team: Perlie Mayo, NP as PCP - General (Family Medicine) Harl Bowie Alphonse Guild, MD as PCP - Cardiology (Cardiology) Brien Mates, RN as Oncology Nurse Navigator (Oncology)  Indicate any recent Medical Services you may have received from other than [facility] providers in the past year (date may be approximate).      Assessment:   This is a routine  wellness examination for [place].  Hearing/Vision screen Vision Screening - Comments:: Patient states gets eyes examined once per year. Currently wears glasses   Dietary issues and exercise activities discussed: Current Exercise Habits: Home exercise routine, Type of exercise: walking, Time (Minutes): 45, Frequency (Times/Week): 7, Weekly Exercise (Minutes/Week): 315, Intensity: Moderate, Exercise limited by: respiratory conditions(s)   Goals Addressed             This Visit's Progress    Patient Stated       I would like to continue to walk 2.5- 5 miles per day       Prevent falls         Depression Screen PHQ 2/9 Scores 03/09/2021 02/18/2021 11/18/2020 08/19/2020 07/31/2020 07/30/2020 07/30/2020  PHQ - 2 Score 0 0 0 0 0 0 0  PHQ- 9 Score - - - - - - -    Fall Risk Fall Risk  03/09/2021 02/18/2021 11/18/2020 08/19/2020 07/31/2020  Falls in the past year? 0 0 0 0 0  Number falls in past yr: 0 0 0 0 0  Injury with Fall? 0 0 0 0 0  Risk for fall due to : No Fall Risks No Fall Risks No Fall Risks No Fall Risks No Fall Risks  Follow up Falls evaluation completed;Falls prevention discussed Falls evaluation completed Falls evaluation completed Falls evaluation completed Falls evaluation completed    FALL RISK PREVENTION PERTAINING TO THE HOME:  Any stairs in or around the home? Yes  If so, are there any without handrails? No  Home free of loose throw rugs in walkways, pet beds, electrical cords, etc? Yes  Adequate lighting in your home to reduce risk of falls? Yes   ASSISTIVE DEVICES UTILIZED TO PREVENT FALLS:  Life alert? No  Use of a cane, walker or w/c? No  Grab bars in the bathroom? Yes  Shower chair or bench in shower? Yes  Elevated toilet seat or a handicapped toilet? Yes    Cognitive Function:  Normal cognitive status assessed by direct observation by this Nurse Health Advisor. No abnormalities found.     6CIT Screen 01/01/2020  What  Year? 0 points  What month? 0 points  What time? 0 points  Count back from 20 0 points  Months in reverse 0 points  Repeat phrase 0 points  Total Score 0    Immunizations Immunization History  Administered Date(s) Administered   Influenza Nasal 06/23/2020   Influenza, High Dose Seasonal PF 05/30/2019   Influenza-Unspecified 06/21/2017, 07/03/2018, 05/30/2019, 06/23/2020   PFIZER(Purple Top)SARS-COV-2 Vaccination 11/11/2019, 12/05/2019, 07/03/2020, 07/03/2020   Pneumococcal Polysaccharide-23 06/04/2019   Tdap 04/18/2014   Zoster, Live 06/26/2013    TDAP status: Up to date  Flu Vaccine status: Up to date  Pneumococcal vaccine status: Due, Education has been provided regarding the importance of this vaccine. Advised may receive this vaccine at local pharmacy or Health Dept. Aware to provide a copy of the vaccination record if obtained from local pharmacy or Health Dept. Verbalized acceptance and understanding.  Covid-19 vaccine status: Completed vaccines  Qualifies for Shingles Vaccine? Yes   Zostavax completed Yes   Shingrix Completed?: No.    Education has been provided regarding the importance of this vaccine. Patient has been advised to call insurance company to determine out of pocket expense if they have not yet received this vaccine. Advised may also receive vaccine at local pharmacy or Health Dept. Verbalized acceptance and understanding.  Screening Tests Health Maintenance  Topic Date Due  Zoster Vaccines- Shingrix (1 of 2) Never done   PNA vac Low Risk Adult (2 of 2 - PCV13) 06/03/2020   COLONOSCOPY (Pts 45-63yrs Insurance coverage will need to be confirmed)  09/09/2020   COVID-19 Vaccine (4 - Booster for Pfizer series) 10/03/2020   INFLUENZA VACCINE  04/20/2021   TETANUS/TDAP  04/18/2024   Hepatitis C Screening  Completed   HPV VACCINES  Aged Out    Health Maintenance  Health Maintenance Due  Topic Date Due   Zoster Vaccines- Shingrix (1 of 2) Never done    PNA vac Low Risk Adult (2 of 2 - PCV13) 06/03/2020   COLONOSCOPY (Pts 45-74yrs Insurance coverage will need to be confirmed)  09/09/2020   COVID-19 Vaccine (4 - Booster for [place] series) 10/03/2020    Colorectal cancer screening: Type of screening: Colonoscopy. Completed 09/09/2017. Repeat every 3 years  Lung Cancer Screening: (Low Dose CT Chest recommended if Age 73-80 years, 30 pack-year currently smoking OR have quit w/in 15years.) does not qualify.   Lung Cancer Screening Referral: N/a   Additional Screening:  Hepatitis C Screening: does qualify; Completed 06/11/2020  Vision Screening: Recommended annual ophthalmology exams for early detection of glaucoma and other disorders of the eye. Is the patient up to date with their annual eye exam?  Yes  Who is the provider or what is the name of the office in which the patient attends annual eye exams? Dr. Gershon Crane If pt is not established with a provider, would they like to be referred to a provider to establish care? No .   Dental Screening: Recommended annual dental exams for proper oral hygiene  Community Resource Referral / Chronic Care Management: CRR required this visit?  No   CCM required this visit?  No      Plan:     I have personally reviewed and noted the following in the patient's chart:   Medical and social history Use of alcohol, tobacco or illicit drugs  Current medications and supplements including opioid prescriptions. Patient is not currently taking opioid prescriptions. Functional ability and status Nutritional status Physical activity Advanced directives List of other physicians Hospitalizations, surgeries, and ER visits in previous 12 months Vitals Screenings to include cognitive, depression, and falls Referrals and appointments  In addition, I have reviewed and discussed with patient certain preventive protocols, quality metrics, and best practice recommendations. A written personalized care plan for  preventive services as well as general preventive health recommendations were provided to patient.     Ofilia Neas, LPN   7/41/4239   Nurse Notes: None

## 2021-03-09 NOTE — Patient Instructions (Signed)
Darius Smith , Thank you for taking time to come for your Medicare Wellness Visit. I appreciate your ongoing commitment to your health goals. Please review the following plan we discussed and let me know if I can assist you in the future.   Screening recommendations/referrals: Colonoscopy: Currently due, please contact Dr. Olevia Perches office Recommended yearly ophthalmology/optometry visit for glaucoma screening and checkup Recommended yearly dental visit for hygiene and checkup  Vaccinations: Influenza vaccine: Up to date, next due fall 2022 Pneumococcal vaccine: Currently due, you may receive at your next office visit Tdap vaccine: Up to date, next due  Shingles vaccine: Currently due, you may receive at your local pharmacy    Advanced directives: copies on file   Conditions/risks identified: None   Next appointment: None   Preventive Care 65 Years and Older, Male Preventive care refers to lifestyle choices and visits with your health care provider that can promote health and wellness. What does preventive care include? A yearly physical exam. This is also called an annual well check. Dental exams once or twice a year. Routine eye exams. Ask your health care provider how often you should have your eyes checked. Personal lifestyle choices, including: Daily care of your teeth and gums. Regular physical activity. Eating a healthy diet. Avoiding tobacco and drug use. Limiting alcohol use. Practicing safe sex. Taking low doses of aspirin every day. Taking vitamin and mineral supplements as recommended by your health care provider. What happens during an annual well check? The services and screenings done by your health care provider during your annual well check will depend on your age, overall health, lifestyle risk factors, and family history of disease. Counseling  Your health care provider may ask you questions about your: Alcohol use. Tobacco use. Drug use. Emotional  well-being. Home and relationship well-being. Sexual activity. Eating habits. History of falls. Memory and ability to understand (cognition). Work and work Statistician. Screening  You may have the following tests or measurements: Height, weight, and BMI. Blood pressure. Lipid and cholesterol levels. These may be checked every 5 years, or more frequently if you are over 36 years old. Skin check. Lung cancer screening. You may have this screening every year starting at age 80 if you have a 30-pack-year history of smoking and currently smoke or have quit within the past 15 years. Fecal occult blood test (FOBT) of the stool. You may have this test every year starting at age 75. Flexible sigmoidoscopy or colonoscopy. You may have a sigmoidoscopy every 5 years or a colonoscopy every 10 years starting at age 36. Prostate cancer screening. Recommendations will vary depending on your family history and other risks. Hepatitis C blood test. Hepatitis B blood test. Sexually transmitted disease (STD) testing. Diabetes screening. This is done by checking your blood sugar (glucose) after you have not eaten for a while (fasting). You may have this done every 1-3 years. Abdominal aortic aneurysm (AAA) screening. You may need this if you are a current or former smoker. Osteoporosis. You may be screened starting at age 56 if you are at high risk. Talk with your health care provider about your test results, treatment options, and if necessary, the need for more tests. Vaccines  Your health care provider may recommend certain vaccines, such as: Influenza vaccine. This is recommended every year. Tetanus, diphtheria, and acellular pertussis (Tdap, Td) vaccine. You may need a Td booster every 10 years. Zoster vaccine. You may need this after age 66. Pneumococcal 13-valent conjugate (PCV13) vaccine. One dose is  recommended after age 22. Pneumococcal polysaccharide (PPSV23) vaccine. One dose is recommended after  age 72. Talk to your health care provider about which screenings and vaccines you need and how often you need them. This information is not intended to replace advice given to you by your health care provider. Make sure you discuss any questions you have with your health care provider. Document Released: 10/03/2015 Document Revised: 05/26/2016 Document Reviewed: 07/08/2015 Elsevier Interactive Patient Education  2017 [place] Prevention in the Home Falls can cause injuries. They can happen to people of all ages. There are many things you can do to make your home safe and to help prevent falls. What can I do on the outside of my home? Regularly fix the edges of walkways and driveways and fix any cracks. Remove anything that might make you trip as you walk through a door, such as a raised step or threshold. Trim any bushes or trees on the path to your home. Use bright outdoor lighting. Clear any walking paths of anything that might make someone trip, such as rocks or tools. Regularly check to see if handrails are loose or broken. Make sure that both sides of any steps have handrails. Any raised decks and porches should have guardrails on the edges. Have any leaves, snow, or ice cleared regularly. Use sand or salt on walking paths during winter. Clean up any spills in your garage right away. This includes oil or grease spills. What can I do in the bathroom? Use night lights. Install grab bars by the toilet and in the tub and shower. Do not use towel bars as grab bars. Use non-skid mats or decals in the tub or shower. If you need to sit down in the shower, use a plastic, non-slip stool. Keep the floor dry. Clean up any water that spills on the floor as soon as it happens. Remove soap buildup in the tub or shower regularly. Attach bath mats securely with double-sided non-slip rug tape. Do not have throw rugs and other things on the floor that can make you trip. What can I do in the  bedroom? Use night lights. Make sure that you have a light by your bed that is easy to reach. Do not use any sheets or blankets that are too big for your bed. They should not hang down onto the floor. Have a firm chair that has side arms. You can use this for support while you get dressed. Do not have throw rugs and other things on the floor that can make you trip. What can I do in the kitchen? Clean up any spills right away. Avoid walking on wet floors. Keep items that you use a lot in easy-to-reach places. If you need to reach something above you, use a strong step stool that has a grab bar. Keep electrical cords out of the way. Do not use floor polish or wax that makes floors slippery. If you must use wax, use non-skid floor wax. Do not have throw rugs and other things on the floor that can make you trip. What can I do with my stairs? Do not leave any items on the stairs. Make sure that there are handrails on both sides of the stairs and use them. Fix handrails that are broken or loose. Make sure that handrails are as long as the stairways. Check any carpeting to make sure that it is firmly attached to the stairs. Fix any carpet that is loose or worn. Avoid having  throw rugs at the top or bottom of the stairs. If you do have throw rugs, attach them to the floor with carpet tape. Make sure that you have a light switch at the top of the stairs and the bottom of the stairs. If you do not have them, ask someone to add them for you. What else can I do to help prevent falls? Wear shoes that: Do not have high heels. Have rubber bottoms. Are comfortable and fit you well. Are closed at the toe. Do not wear sandals. If you use a stepladder: Make sure that it is fully opened. Do not climb a closed stepladder. Make sure that both sides of the stepladder are locked into place. Ask someone to hold it for you, if possible. Clearly mark and make sure that you can see: Any grab bars or  handrails. First and last steps. Where the edge of each step is. Use tools that help you move around (mobility aids) if they are needed. These include: Canes. Walkers. Scooters. Crutches. Turn on the lights when you go into a dark area. Replace any light bulbs as soon as they burn out. Set up your furniture so you have a clear path. Avoid moving your furniture around. If any of your floors are uneven, fix them. If there are any pets around you, be aware of where they are. Review your medicines with your doctor. Some medicines can make you feel dizzy. This can increase your chance of falling. Ask your doctor what other things that you can do to help prevent falls. This information is not intended to replace advice given to you by your health care provider. Make sure you discuss any questions you have with your health care provider. Document Released: 07/03/2009 Document Revised: 02/12/2016 Document Reviewed: 10/11/2014 Elsevier Interactive Patient Education  2017 [employer].

## 2021-03-31 ENCOUNTER — Other Ambulatory Visit: Payer: Self-pay | Admitting: Nurse Practitioner

## 2021-03-31 ENCOUNTER — Encounter: Payer: Self-pay | Admitting: Nurse Practitioner

## 2021-03-31 ENCOUNTER — Other Ambulatory Visit: Payer: Self-pay

## 2021-03-31 DIAGNOSIS — R0602 Shortness of breath: Secondary | ICD-10-CM

## 2021-03-31 DIAGNOSIS — I5022 Chronic systolic (congestive) heart failure: Secondary | ICD-10-CM

## 2021-03-31 DIAGNOSIS — Z515 Encounter for palliative care: Secondary | ICD-10-CM

## 2021-03-31 NOTE — Progress Notes (Signed)
[facility] Consult Note Telephone: [phone]  Fax: [phone]    Date of encounter: 03/31/21 PATIENT NAME: [address] [id number]   [phone] (home)  DOB: 1946-07-17 MRN: [id number] PRIMARY CARE PROVIDER:    [name], MD,  [address] [place] [address] [phone]  RESPONSIBLE PARTY:    Contact Information     Name Relation Home Work Mobile   Darius Smith [phone]  [phone]      Due to the COVID-19 crisis, this visit was done via telemedicine from my office and it was initiated and consent by this patient and or family.  I connected with  Darius Smith on 03/31/21 by a video enabled telemedicine application and verified that I am speaking with the correct person.   I discussed the limitations of evaluation and management by telemedicine. The patient expressed understanding and agreed to proceed.  Palliative Care was asked to follow this patient by consultation request of  Darius Mayo, NP to address advance care planning and complex medical decision making. This is a follow up visit.  ASSESSMENT AND PLAN / RECOMMENDATIONS:   Symptom Management/Plan: 1. Advance Care Planning; DNR in vynca   2. Palliative care encounter; Palliative care encounter; Palliative medicine team will continue to support patient, patient's family, and medical team. Visit consisted of counseling and education dealing with the complex and emotionally intense issues of symptom management and palliative care in the setting of serious and potentially life-threatening illness   3. Shortness of breath secondary to CHF/stable echo repeated from 20 to 25% to 50%. Continue cardiology visit with appointments at Atrial fibrillation clinic. Discussed continuing to try to see if Darius Smith does [employer] program through CHF clinic, though not as of yet established as in other [place]  counties. Will continue to monitor  4. F/u pc visit in 12 weeks or sooner if declines   Follow up Palliative Care Visit: Palliative care will continue to follow for complex medical decision making, advance care planning, and clarification of goals. Return 8 weeks or prn.  I spent 35 minutes providing this consultation. More than 50% of the time in this consultation was spent in counseling and care coordination.  PPS: 60%  Chief Complaint: Follow up Palliative consult for complex medical decision making  HISTORY OF PRESENT ILLNESS:  Darius Smith is a 75 y.o. year old male  with multiple medical problems including Cardiomegaly with ef 20 to 25%, atrial fibrillation, lymphoma, hypertension, chronic systolic congestive heart failure, gerd, chronic kidney disease, history of kidney stone, arthritis, cholecystectomy, history of cardioversion. I called Darius and Darius Smith for telemedicine telephonic as video not available for follow up PC visit. Darius and Darius. Smith both in agreement. We talked about how Darius. Smith has been feeling. Darius. Smith endorses he has been doing very well. Darius Smith endorses no pain except an occasional low back which he takes tylenol for with relief. No recent exacerbations. 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 We talked about sleep which he has no trouble. We talked about appetite which is good. We talked about medical goals of care, quality of life. We talked about recent Cardiology visit, Nephrology with creatinine slightly improving. We talked about cancer with last Oncology visit. Darius. Smith endorses the cancer is on hold as heart and kidneys have been their priority. We talked about chronic disease  progression as currently asymptomatic. Darius Smith shared she continues to monitor output and fluids. We talked about daily routine. We talked about role pc in poc, at present time Darius. Smith is stable. Will f/u in 3 months or sooner if  declines. Darius Smith in agreement, appointment scheduled. Therapeutic listening, emotional support provided. Questions answered.   History obtained from review of EMR, discussion with Darius and Darius. Smith.  I reviewed available labs, medications, imaging, studies and related documents from the EMR.  Records reviewed and summarized above.   ROS Full 14 system review of systems performed and negative with exception of: as per HPI.   Physical Exam: Deferred  Questions and concerns were addressed. The patient/family was encouraged to call with questions and/or concerns. My contact information was provided. Provided general support and encouragement, no other unmet needs identified   Thank you for the opportunity to participate in the care of Darius. Smith.  The palliative care team will continue to follow. Please call our office at [phone] if we can be of additional assistance.   This chart was dictated using voice recognition software.  Despite best efforts to proofread,  errors can occur which can change the documentation meaning.   Darius Digeronimo [name], NP

## 2021-04-07 DIAGNOSIS — L570 Actinic keratosis: Secondary | ICD-10-CM | POA: Diagnosis not present

## 2021-04-07 DIAGNOSIS — L0100 Impetigo, unspecified: Secondary | ICD-10-CM | POA: Diagnosis not present

## 2021-04-07 DIAGNOSIS — L663 Perifolliculitis capitis abscedens: Secondary | ICD-10-CM | POA: Diagnosis not present

## 2021-04-07 DIAGNOSIS — Z1283 Encounter for screening for malignant neoplasm of skin: Secondary | ICD-10-CM | POA: Diagnosis not present

## 2021-04-10 ENCOUNTER — Encounter (HOSPITAL_COMMUNITY): Payer: Self-pay

## 2021-04-14 ENCOUNTER — Other Ambulatory Visit: Payer: Self-pay | Admitting: *Deleted

## 2021-04-14 MED ORDER — MECLIZINE HCL 25 MG PO TABS: 25.0000 mg | ORAL_TABLET | Freq: Four times a day (QID) | ORAL | 0 refills | Status: DC | PRN

## 2021-04-14 NOTE — Telephone Encounter (Signed)
Can give a prescription for meclizine 25mg  po q6 hrs prn dizziness. Would be good to know if it helps symptoms as this would confirm if its vertigo. His primary should also do something call orthostatic vitals signs at there visit.   Zandra Abts MD

## 2021-04-16 ENCOUNTER — Other Ambulatory Visit: Payer: Self-pay

## 2021-04-16 ENCOUNTER — Encounter: Payer: Self-pay | Admitting: Internal Medicine

## 2021-04-16 ENCOUNTER — Ambulatory Visit (INDEPENDENT_AMBULATORY_CARE_PROVIDER_SITE_OTHER): Payer: Medicare Other | Admitting: Internal Medicine

## 2021-04-16 VITALS — BP 114/56 | HR 71 | Temp 98.1°F | Resp 18 | Ht 72.0 in | Wt 189.1 lb

## 2021-04-16 DIAGNOSIS — C8258 Diffuse follicle center lymphoma, lymph nodes of multiple sites: Secondary | ICD-10-CM

## 2021-04-16 DIAGNOSIS — J0100 Acute maxillary sinusitis, unspecified: Secondary | ICD-10-CM | POA: Diagnosis not present

## 2021-04-16 DIAGNOSIS — R42 Dizziness and giddiness: Secondary | ICD-10-CM

## 2021-04-16 MED ORDER — AZITHROMYCIN 250 MG PO TABS: ORAL_TABLET | ORAL | 0 refills | Status: DC

## 2021-04-16 NOTE — Progress Notes (Signed)
Acute Office Visit  Subjective:    Patient ID: Darius Smith, male    DOB: [birth year], 75 y.o.   MRN: [id number]  Chief Complaint  Patient presents with   Dizziness    Patient has been having dizzy spells when he stands up for about 2 weeks started 03-03-21. It does happen when he lays down too     HPI Patient is in today for evaluation of nasal congestion, sinus headache and dizziness for last 2 weeks after he visited beach. He denies any fever, chills, or dyspnea. Dizziness was worse in the last week, and had difficulty walking due to it. He has been taking Meclizine PRN for dizziness. Denies any chest pain or palpitations currently.  Past Medical History:  Diagnosis Date   Allergic rhinitis 07/02/2019   Arthritis    both hands   Atrial fibrillation ([place]) 10/28/2017   Dr. 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 Buenger developed a. fib in recovery after cysto procedure 10/2017 was transferred to [facility] and underwent successful cardioversion   Diverticulosis of sigmoid colon 08/2017   Noted on colonoscopy   Dysrhythmia    External hemorrhoids 08/2017   GERD (gastroesophageal reflux disease)    History of colon polyps 08/2017   History of kidney stones    History of rectal bleeding    History of right inguinal hernia    Hypertension    Lymphoma ([place])    Nephrolithiasis 07/02/2019   Rectal bleeding 09/02/2017   Added automatically from request for surgery [id number]   Shortness of breath     Past Surgical History:  Procedure Laterality Date   CARDIOVERSION     10/2017   CARDIOVERSION N/A 07/16/2020   Procedure: CARDIOVERSION;  Surgeon: Arnoldo Lenis, MD;  [facility];  Service: Endoscopy;  Laterality: N/A;   CARDIOVERSION N/A 08/21/2020   Procedure: CARDIOVERSION;  Surgeon: Satira Sark, MD;  Location: [facility] ORS;   Service: Cardiovascular;  Laterality: N/A;   CARDIOVERSION N/A 09/10/2020   Procedure: CARDIOVERSION;  Surgeon: Josue Hector, MD;  Location: [facility] ENDOSCOPY;  Service: Cardiovascular;  Laterality: N/A;   CHOLECYSTECTOMY     CLEFT LIP REPAIR     several from childhood til 75 years old   [place]     several from childhood til 75 years old   COLONOSCOPY  04/28/2011   Procedure: COLONOSCOPY;  Surgeon: Rogene Houston, MD;  [facility]: [facility];  Service: Endoscopy;  Laterality: N/A;   COLONOSCOPY N/A 07/18/2014   Procedure: COLONOSCOPY;  Surgeon: Rogene Houston, MD;  [facility];  Service: Endoscopy;  Laterality: N/A;  830   COLONOSCOPY N/A 09/09/2017   Procedure: COLONOSCOPY;  Surgeon: Rogene Houston, MD;  Location: [facility];  Service: Endoscopy;  Laterality: N/A;  955   CYSTOSCOPY/URETEROSCOPY/HOLMIUM LASER/STENT PLACEMENT Bilateral 12/06/2017   Procedure: CYSTOSCOPY/RETROGRADE/URETEROSCOPY/HOLMIUM LASER/STENT EXCHANGE;  Surgeon: Festus Aloe, MD;  Location: [facility] ORS;  Service: Urology;  Laterality: Bilateral;  ONLY NEEDS 60 MIN   HERNIA REPAIR     POLYPECTOMY  09/09/2017   Procedure: POLYPECTOMY;  Surgeon: Rogene Houston, MD;  Location: [facility];  Service: Endoscopy;;  colon   vocal cord surgery      Family History  Problem Relation Age  of Onset   Breast cancer Mother    Prostate cancer Father    COPD Sister     Social History   Socioeconomic History   Marital status: Married    Spouse name: Not on file   Number of children: 2   Years of education: Not on file   Highest education level: Not on file  Occupational History   Occupation: retired  Tobacco Use   Smoking status: Never   Smokeless tobacco: Never  Vaping Use   Vaping Use: Never used  Substance and Sexual Activity   Alcohol use: Not Currently    Alcohol/week: 7.0 standard drinks    Types: 7 Cans of beer per week   Drug use: No   Sexual activity: Not on file  Other  Topics Concern   Not on file  Social History Narrative   Lives with wife [name] of 29 years April 15, 2020      2 children, 2 grandchildren   Dog: Gardner Candle      Enjoys: being outdoors       Diet: eats all food groups    Caffeine: drink coffee in the morning, some tea, diet dr pepper   Water: 3-4 cups daily         Wears seat belt    Does not use phone while driving   Louann put up not loaded          Social Determinants of Radio broadcast assistant Strain: Low Risk    Difficulty of Paying Living Expenses: Not hard at all  Food Insecurity: No Food Insecurity   Worried About Charity fundraiser in the Last Year: Never true   Arboriculturist in the Last Year: Never true  Transportation Needs: No Transportation Needs   Lack of Transportation (Medical): No   Lack of Transportation (Non-Medical): No  Physical Activity: Sufficiently Active   Days of Exercise per Week: 7 days   Minutes of Exercise per Session: 50 min  Stress: No Stress Concern Present   Feeling of Stress : Not at all  Social Connections: Moderately Integrated   Frequency of Communication with Friends and Family: More than three times a week   Frequency of Social Gatherings with Friends and Family: More than three times a week   Attends Religious Services: More than 4 times per year   Active Member of [employer] or Organizations: No   Attends Archivist Meetings: Never   Marital Status: Married  Human resources officer Violence: Not At Risk   Fear of Current or Ex-Partner: No   Emotionally Abused: No   Physically Abused: No   Sexually Abused: No    Outpatient Medications Prior to Visit  Medication Sig Dispense Refill   acetaminophen (TYLENOL) 500 MG tablet Take 1,000 mg by mouth every 6 (six) hours as needed for moderate pain or headache.     amiodarone (PACERONE) 200 MG tablet Take 1 tablet (200 mg total) by mouth daily. 60 tablet 6   amLODipine (NORVASC) 10 MG tablet Take 1  tablet (10 mg total) by mouth daily. 90 tablet 3   cetirizine (ZYRTEC) 10 MG tablet Take 1 tablet (10 mg total) by mouth daily as needed for allergies. 90 tablet 2   ELIQUIS 5 MG TABS tablet TAKE ONE TABLET BY MOUTH 2 TIMES A DAY. 60 tablet 5   hydrALAZINE (APRESOLINE) 100 MG tablet Take 1 tablet (100 mg total) by mouth 3 (  three) times daily. 90 tablet 3   isosorbide mononitrate (IMDUR) 30 MG 24 hr tablet TAKE 1/2 TABLET BY MOUTH ONCE DAILY. 90 tablet 3   meclizine (ANTIVERT) 25 MG tablet Take 1 tablet (25 mg total) by mouth every 6 (six) hours as needed for dizziness. 30 tablet 0   metoprolol succinate (TOPROL XL) 25 MG 24 hr tablet Take 1 tablet (25 mg total) by mouth daily. 90 tablet 3   omeprazole (PRILOSEC) 20 MG capsule      potassium chloride SA (KLOR-CON) 20 MEQ tablet Take 1 tablet (20 mEq total) by mouth daily. 90 tablet 3   spironolactone (ALDACTONE) 25 MG tablet TAKE 1 TABLET BY MOUTH ONCE DAILY. 90 tablet 0   VENTOLIN HFA 108 (90 Base) MCG/ACT inhaler Inhale 2 puffs into the lungs every 6 (six) hours as needed for wheezing or shortness of breath. 18 g 3   furosemide (LASIX) 20 MG tablet Take 1 tablet (20 mg total) by mouth daily. May take an extra 20 mg for swelling or SOB. 110 tablet 3   No facility-administered medications prior to visit.    Allergies  Allergen Reactions   Augmentin [Amoxicillin-Pot Clavulanate] Other (See Comments)    Increased liver enzymes Has patient had a PCN reaction causing immediate rash, facial/tongue/throat swelling, SOB or lightheadedness with hypotension: No Has patient had a PCN reaction causing severe rash involving mucus membranes or skin necrosis: No Has patient had a PCN reaction that required hospitalization: No Has patient had a PCN reaction occurring within the last 10 years: No  If all of the above answers are "NO", then may proceed with Cephalosporin use.    Flomax [Tamsulosin Hcl] Nausea Only    Dizziness  Pt is on flomax    Review  of Systems  Constitutional:  Negative for chills and fever.  HENT:  Positive for congestion, postnasal drip, rhinorrhea, sinus pain and voice change.   Respiratory:  Negative for cough and shortness of breath.   Cardiovascular:  Negative for chest pain and palpitations.  Genitourinary:  Negative for dysuria and hematuria.  Skin:  Negative for rash.  Neurological:  Positive for dizziness. Negative for syncope.      Objective:    Physical Exam Constitutional:      Appearance: He is obese.  HENT:     Head: Normocephalic and atraumatic.     Nose: Congestion present.     Right Sinus: Maxillary sinus tenderness present.     Left Sinus: Maxillary sinus tenderness present.     Mouth/Throat:     Pharynx: No oropharyngeal exudate.  Cardiovascular:     Rate and Rhythm: Normal rate and regular rhythm.     Heart sounds: Normal heart sounds. No murmur heard. Pulmonary:     Breath sounds: Normal breath sounds. No wheezing or rales.  Skin:    General: Skin is dry.     Findings: No rash.  Neurological:     General: No focal deficit present.     Mental Status: He is alert and oriented to person, place, and time.  Psychiatric:        Mood and Affect: Mood normal.        Behavior: Behavior normal.    BP (!) 114/56 (BP Location: Left Arm, Patient Position: Sitting, Cuff Size: Normal)   Pulse 71   Temp 98.1 F (36.7 C) (Oral)   Resp 18   Ht 6' (1.829 m)   Wt 189 lb 1.9 oz (85.8 kg)   SpO2  97%   BMI 25.65 kg/m  Wt Readings from Last 3 Encounters:  04/16/21 189 lb 1.9 oz (85.8 kg)  02/18/21 186 lb (84.4 kg)  02/04/21 189 lb 4.8 oz (85.9 kg)    Health Maintenance Due  Topic Date Due   Zoster Vaccines- Shingrix (1 of 2) Never done   PNA vac Low Risk Adult (2 of 2 - PCV13) 06/03/2020   COLONOSCOPY (Pts 45-89yrs Insurance coverage will need to be confirmed)  09/09/2020   COVID-19 Vaccine (4 - Booster for [place] series) 10/03/2020    There are no preventive care reminders to display  for this patient.   Lab Results  Component Value Date   TSH 1.156 08/01/2020   Lab Results  Component Value Date   WBC 8.4 01/28/2021   HGB 12.1 (L) 01/28/2021   HCT 38.0 (L) 01/28/2021   MCV 95.5 01/28/2021   PLT 254 01/28/2021   Lab Results  Component Value Date   NA 136 03/03/2021   K 4.9 03/03/2021   CO2 22 03/03/2021   GLUCOSE 94 03/03/2021   BUN 29 (H) 03/03/2021   CREATININE 1.86 (H) 03/03/2021   BILITOT 0.7 01/28/2021   ALKPHOS 57 01/28/2021   AST 16 01/28/2021   ALT 22 01/28/2021   PROT 6.9 01/28/2021   ALBUMIN 3.5 01/28/2021   CALCIUM 8.9 03/03/2021   ANIONGAP 6 03/03/2021   Lab Results  Component Value Date   CHOL 173 02/11/2021   Lab Results  Component Value Date   HDL 49 02/11/2021   Lab Results  Component Value Date   LDLCALC 104 (H) 02/11/2021   Lab Results  Component Value Date   TRIG 110 02/11/2021   Lab Results  Component Value Date   CHOLHDL 2.9 07/31/2020   No results found for: HGBA1C     Assessment & Plan:   Problem List Items Addressed This Visit     Other Visit Diagnoses     Acute maxillary sinusitis, recurrence not specified    -  Primary Symptoms likely due to acute sinusitis Azithromycin prescribed Nasal saline spray PRN Continue Cetirizine for allergies   Relevant Medications   azithromycin (ZITHROMAX) 250 MG tablet   Dizziness     Orthostatics positive - advised to take decrease dose of Hydralazine to twice daily for now until next Cardiology visit Likely due to sinusitis Meclizine PRN Avoid sudden positional changes      Other   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

## 2021-04-16 NOTE — Patient Instructions (Addendum)
Please take Hydralazine only twice daily instead of three times.  Please avoid sudden positional changes.  Okay to use nasal saline spray as needed for nasal congestion. Use humidifier at night time.  Meclizine as needed for dizziness. Please stay hydrated by taking at least 50 ounces of fluid in a day.

## 2021-04-21 ENCOUNTER — Encounter: Payer: Self-pay | Admitting: Cardiology

## 2021-04-21 ENCOUNTER — Ambulatory Visit: Payer: Medicare Other | Admitting: Cardiology

## 2021-04-21 VITALS — BP 116/70 | HR 52 | Ht 72.0 in | Wt 190.0 lb

## 2021-04-21 DIAGNOSIS — I4891 Unspecified atrial fibrillation: Secondary | ICD-10-CM

## 2021-04-21 DIAGNOSIS — I5022 Chronic systolic (congestive) heart failure: Secondary | ICD-10-CM

## 2021-04-21 DIAGNOSIS — R42 Dizziness and giddiness: Secondary | ICD-10-CM

## 2021-04-21 DIAGNOSIS — I10 Essential (primary) hypertension: Secondary | ICD-10-CM

## 2021-04-21 MED ORDER — FUROSEMIDE 20 MG PO TABS: 20.0000 mg | ORAL_TABLET | ORAL | Status: DC

## 2021-04-21 NOTE — Patient Instructions (Addendum)
Medication Instructions:  Change your Lasix to 20mg  every other day, may take daily as needed for increased swelling.  Continue all other medications.    Labwork: none  Testing/Procedures: none  Follow-Up: 3 months   Any Other Special Instructions Will Be Listed Below (If Applicable).  If you need a refill on your cardiac medications before your next appointment, please call your pharmacy.

## 2021-04-21 NOTE — Progress Notes (Signed)
Clinical Summary Darius Smith is a 75 y.o.maleseen today for follow up of the following medical problems.    1. Chronic systolic HF - 09/7508 echo LVEF 20-25%. New diagnosis of systolic HF at that time.  - recent admission 06/2020 with volume overload. BNP  2000s, CXR with edema.  - diuresis was limited by elevation in Cr - with elevation in Cr we stopped his entresto, started hydral/imdur.   - systolic dysfunction thought to be tachy mediated as significant issues with afib with RVR at the same time. - have not pursued ischemic testing as of yet, repeat echo few months after afib controlled and pending renal function     - 11/2020 echo LVEF 50%   - July 15 weight was 183 lbs. Most recent 184 lbs, overall home weights stable - no recent edema. No SOB/DOE. Takes lasix 20mg  daily, rarely needs extra. From pcp note was orthostatic at that visit, hydral was changed to 100mg  bid from tid   2. Persistent afib - dynamaps do not accuratelty measure his HRs, best measured by EKG>  - issues with elevated HRs recently including during recent admission - difficulty controlling with beta blocker alone, avoiding CCB due to systolic dysfunction, renal function and HF limit antiarrhythmic options. During admission we started amiodarone - failed attempt at [place] 07/16/20, plan to continue oral load and retry in 3 weeks.    - on amio, DCCVs on 12/2 and 09/10/20, last 09/10/20 conversion was succesful. - has been maintaining SR at follow ups. - no recent symptoms   3. Stage IV lymphoma - from onc notes just monitoring at this time, in absence of symptoms no indciation for therapy.  - I did hear back from oncology, they report his lymphoma is not curable and treatment only helps symptoms. Prognosis can be a few years to a few decades, and they do not see a contraindciation to proceeding with cath in the future.    4. HTN -compliant with meds    5. Dizziness  - abnormal orthostatics at pcp  office - also recent diagnosis of sinusitis   - no symptoms lying or sitting. Only occurs with standing - feels off balance, denies dizzy or lightheaded - not much better with meclizine. Sinus congestion, fullnees in head - pcp started on azithromycin, completed course.   Wife is former Set designer at [employer]. Very good at keeping track of his weights, vitals etc.    Past Medical History:  Diagnosis Date   Allergic rhinitis 07/02/2019   Arthritis    both hands   Atrial fibrillation ([place]) 10/28/2017   Dr. Harl Bowie   Atrial fibrillation with RVR ([place]) 07/14/2020   Blood in urine    occ   Bronchitis    Cancer ([place])    Basal cell   Cardiomegaly 25/85/2778   Complication of anesthesia    Mr. Conradt developed a. fib in recovery after cysto procedure 10/2017 was transferred to [facility] and underwent successful cardioversion   Diverticulosis of sigmoid colon 08/2017   Noted on colonoscopy   Dysrhythmia    External hemorrhoids 08/2017   GERD (gastroesophageal reflux disease)    History of colon polyps 08/2017   History of kidney stones    History of rectal bleeding    History of right inguinal hernia    Hypertension    Lymphoma ([place])    Nephrolithiasis 07/02/2019   Rectal bleeding 09/02/2017   Added automatically from request for surgery [phone]  Shortness of breath      Allergies  Allergen Reactions   Augmentin [Amoxicillin-Pot Clavulanate] Other (See Comments)    Increased liver enzymes Has patient had a PCN reaction causing immediate rash, facial/tongue/throat swelling, SOB or lightheadedness with hypotension: No Has patient had a PCN reaction causing severe rash involving mucus membranes or skin necrosis: No Has patient had a PCN reaction that required hospitalization: No Has patient had a PCN reaction occurring within the last 10 years: No  If all of the above answers are "NO", then may proceed with Cephalosporin use.    Flomax [Tamsulosin Hcl]  Nausea Only    Dizziness  Pt is on flomax     Current Outpatient Medications  Medication Sig Dispense Refill   acetaminophen (TYLENOL) 500 MG tablet Take 1,000 mg by mouth every 6 (six) hours as needed for moderate pain or headache.     amiodarone (PACERONE) 200 MG tablet Take 1 tablet (200 mg total) by mouth daily. 60 tablet 6   amLODipine (NORVASC) 10 MG tablet Take 1 tablet (10 mg total) by mouth daily. 90 tablet 3   azithromycin (ZITHROMAX) 250 MG tablet Take 2 tablets on day 1, then 1 tablet daily on days 2 through 5 6 tablet 0   cetirizine (ZYRTEC) 10 MG tablet Take 1 tablet (10 mg total) by mouth daily as needed for allergies. 90 tablet 2   ELIQUIS 5 MG TABS tablet TAKE ONE TABLET BY MOUTH 2 TIMES A DAY. 60 tablet 5   furosemide (LASIX) 20 MG tablet Take 1 tablet (20 mg total) by mouth daily. May take an extra 20 mg for swelling or SOB. 110 tablet 3   hydrALAZINE (APRESOLINE) 100 MG tablet Take 1 tablet (100 mg total) by mouth 3 (three) times daily. 90 tablet 3   isosorbide mononitrate (IMDUR) 30 MG 24 hr tablet TAKE 1/2 TABLET BY MOUTH ONCE DAILY. 90 tablet 3   meclizine (ANTIVERT) 25 MG tablet Take 1 tablet (25 mg total) by mouth every 6 (six) hours as needed for dizziness. 30 tablet 0   metoprolol succinate (TOPROL XL) 25 MG 24 hr tablet Take 1 tablet (25 mg total) by mouth daily. 90 tablet 3   omeprazole (PRILOSEC) 20 MG capsule      potassium chloride SA (KLOR-CON) 20 MEQ tablet Take 1 tablet (20 mEq total) by mouth daily. 90 tablet 3   spironolactone (ALDACTONE) 25 MG tablet TAKE 1 TABLET BY MOUTH ONCE DAILY. 90 tablet 0   VENTOLIN HFA 108 (90 Base) MCG/ACT inhaler Inhale 2 puffs into the lungs every 6 (six) hours as needed for wheezing or shortness of breath. 18 g 3   No current facility-administered medications for this visit.     Past Surgical History:  Procedure Laterality Date   CARDIOVERSION     10/2017   CARDIOVERSION N/A 07/16/2020   Procedure: CARDIOVERSION;   Surgeon: Arnoldo Lenis, MD;  [facility];  Service: Endoscopy;  Laterality: N/A;   CARDIOVERSION N/A 08/21/2020   Procedure: CARDIOVERSION;  Surgeon: Satira Sark, MD;  Location: [facility] ORS;  Service: Cardiovascular;  Laterality: N/A;   CARDIOVERSION N/A 09/10/2020   Procedure: CARDIOVERSION;  Surgeon: Josue Hector, MD;  Location: [facility] ENDOSCOPY;  Service: Cardiovascular;  Laterality: N/A;   CHOLECYSTECTOMY     CLEFT LIP REPAIR     several from childhood til 75 years old   [place]     several from childhood til 75 years old   COLONOSCOPY  04/28/2011   Procedure: COLONOSCOPY;  Surgeon: Rogene Houston, MD;  [facility];  Service: Endoscopy;  Laterality: N/A;   COLONOSCOPY N/A 07/18/2014   Procedure: COLONOSCOPY;  Surgeon: Rogene Houston, MD;  [facility];  Service: Endoscopy;  Laterality: N/A;  830   COLONOSCOPY N/A 09/09/2017   Procedure: COLONOSCOPY;  Surgeon: Rogene Houston, MD;  [facility];  Service: Endoscopy;  Laterality: N/A;  955   CYSTOSCOPY/URETEROSCOPY/HOLMIUM LASER/STENT PLACEMENT Bilateral 12/06/2017   Procedure: CYSTOSCOPY/RETROGRADE/URETEROSCOPY/HOLMIUM LASER/STENT EXCHANGE;  Surgeon: Festus Aloe, MD;  [facility]: [facility];  Service: Urology;  Laterality: Bilateral;  ONLY NEEDS 60 MIN   HERNIA REPAIR     POLYPECTOMY  09/09/2017   Procedure: POLYPECTOMY;  Surgeon: Rogene Houston, MD;  [facility];  Service: Endoscopy;;  colon   vocal cord surgery       Allergies  Allergen Reactions   Augmentin [Amoxicillin-Pot Clavulanate] Other (See Comments)    Increased liver enzymes Has patient had a PCN reaction causing immediate rash, facial/tongue/throat swelling, SOB or lightheadedness with hypotension: No Has patient had a PCN reaction causing severe rash involving mucus membranes or skin necrosis: No Has patient had a PCN reaction that required hospitalization: No Has patient had a PCN reaction  occurring within the last 10 years: No  If all of the above answers are "NO", then may proceed with Cephalosporin use.    Flomax [Tamsulosin Hcl] Nausea Only    Dizziness  Pt is on flomax      Family History  Problem Relation Age of Onset   Breast cancer Mother    Prostate cancer Father    COPD Sister      Social History Mr. Sweetin reports that he has never smoked. He has never used smokeless tobacco. Mr. Vogel reports previous alcohol use of about 7.0 standard drinks of alcohol per week.   Review of Systems CONSTITUTIONAL: No weight loss, fever, chills, weakness or fatigue.  HEENT: Eyes: No visual loss, blurred vision, double vision or yellow sclerae.No hearing loss, sneezing, congestion, runny nose or sore throat.  SKIN: No rash or itching.  CARDIOVASCULAR: per hpi RESPIRATORY: No shortness of breath, cough or sputum.  GASTROINTESTINAL: No anorexia, nausea, vomiting or diarrhea. No abdominal pain or blood.  GENITOURINARY: No burning on urination, no polyuria NEUROLOGICAL: No headache, dizziness, syncope, paralysis, ataxia, numbness or tingling in the extremities. No change in bowel or bladder control.  MUSCULOSKELETAL: No muscle, back pain, joint pain or stiffness.  LYMPHATICS: No enlarged nodes. No history of splenectomy.  PSYCHIATRIC: No history of depression or anxiety.  ENDOCRINOLOGIC: No reports of sweating, cold or heat intolerance. No polyuria or polydipsia.  Marland Kitchen   Physical Examination Today's Vitals   04/21/21 1414  BP: 116/70  Pulse: (!) 52  SpO2: 98%  Weight: 190 lb (86.2 kg)  Height: 6' (1.829 m)   Body mass index is 25.77 kg/m.  Gen: resting comfortably, no acute distress HEENT: no scleral icterus, pupils equal round and reactive, no palptable cervical adenopathy,  CV: regular, brady 55, no mr/g, no jvd Resp: Clear to auscultation bilaterally GI: abdomen is soft, non-tender, non-distended, normal bowel sounds, no hepatosplenomegaly MSK: extremities  are warm, no edema.  Skin: warm, no rash Neuro:  no focal deficits Psych: appropriate affect   Diagnostic Studies 11/2020 echo IMPRESSIONS     1. Left ventricular ejection fraction, by estimation, is 50%. The left  ventricle has mildly decreased function. The left ventricle has no  regional wall motion abnormalities. The left ventricular internal cavity  size was mildly dilated. Left ventricular  diastolic parameters are consistent with Grade I diastolic dysfunction  (impaired relaxation). The average left ventricular global longitudinal  strain is -18.2 %. The global longitudinal strain is normal.   2. Right ventricular systolic function is normal. The right ventricular  size is normal.   3. Left atrial size was severely dilated.   4. Right atrial size was mildly dilated.   5. The mitral valve is normal in structure. No evidence of mitral valve  regurgitation. No evidence of mitral stenosis.   6. The aortic valve was not well visualized. There is mild calcification  of the aortic valve. There is mild thickening of the aortic valve. Aortic  valve regurgitation is mild. Mild aortic valve stenosis.   7. The inferior vena cava is normal in size with greater than 50%  respiratory variability, suggesting right atrial pressure of 3 mmHg.   Comparison(s): Echocardiogram done 06/12/20 showed an EF of 20-25%.      Assessment and Plan  1. Chronic systolic HF - possibly tachy mediated given recent issues with afib with RVR - entresto stopped during recent admission with AKI.  - from last echo LVEF has normalized.  - from pcp notes was orthostatic in clinic, we will change lasix to 20mg  every other day, may take daily as needed for weight gain or edema.      2. Afib - no symptoms, continue currentmeds - some bradycardia in 50s. Very difficult time getting his afib controlled previously, would not alter regimen unless more significant brancyardia.   3. HTN - at goal, ok to continue  hydral 100mg  bid that was changed by pcp  4. Dizziness - sounds more like related to sinusitis, he was however orthotatitc at pcp office - lasix and hydral changes as reported above, ongoing management of sinusitis by pcp   F/u 3 months     Arnoldo Lenis, M.D

## 2021-04-22 ENCOUNTER — Other Ambulatory Visit: Payer: Self-pay | Admitting: Internal Medicine

## 2021-04-22 ENCOUNTER — Encounter: Payer: Self-pay | Admitting: Internal Medicine

## 2021-04-22 DIAGNOSIS — J0100 Acute maxillary sinusitis, unspecified: Secondary | ICD-10-CM

## 2021-04-22 MED ORDER — MOXIFLOXACIN HCL 400 MG PO TABS: 400.0000 mg | ORAL_TABLET | Freq: Every day | ORAL | 0 refills | Status: DC

## 2021-05-07 ENCOUNTER — Other Ambulatory Visit (HOSPITAL_COMMUNITY): Payer: Self-pay

## 2021-05-07 DIAGNOSIS — C8258 Diffuse follicle center lymphoma, lymph nodes of multiple sites: Secondary | ICD-10-CM

## 2021-05-07 NOTE — Progress Notes (Signed)
Patient's wife called reporting new, ongoing dizziness, balance/equilibrium issues and sweating. Dr. Delton Coombes made aware and order received for brain MRI with and without. Scheduling pending.

## 2021-05-08 ENCOUNTER — Other Ambulatory Visit (HOSPITAL_COMMUNITY): Payer: Self-pay

## 2021-05-08 ENCOUNTER — Encounter (HOSPITAL_COMMUNITY): Payer: Self-pay

## 2021-05-08 MED ORDER — ALPRAZOLAM 0.5 MG PO TABS: 0.5000 mg | ORAL_TABLET | Freq: Once | ORAL | 0 refills | Status: AC

## 2021-05-09 ENCOUNTER — Other Ambulatory Visit: Payer: Self-pay

## 2021-05-09 ENCOUNTER — Ambulatory Visit (HOSPITAL_COMMUNITY)
Admission: RE | Admit: 2021-05-09 | Discharge: 2021-05-09 | Disposition: A | Discharge status: Home / Self Care | Payer: Medicare Other | Source: Ambulatory Visit | Attending: Hematology | Admitting: Hematology

## 2021-05-09 DIAGNOSIS — C8258 Diffuse follicle center lymphoma, lymph nodes of multiple sites: Secondary | ICD-10-CM | POA: Insufficient documentation

## 2021-05-09 DIAGNOSIS — G9389 Other specified disorders of brain: Secondary | ICD-10-CM | POA: Diagnosis not present

## 2021-05-09 MED ORDER — GADOBUTROL 1 MMOL/ML IV SOLN
8.5000 mL | Freq: Once | INTRAVENOUS | Status: AC | PRN
  Administered 2021-05-09: 8.5 mL via INTRAVENOUS

## 2021-05-11 ENCOUNTER — Other Ambulatory Visit (HOSPITAL_COMMUNITY): Payer: Self-pay

## 2021-05-11 ENCOUNTER — Telehealth (HOSPITAL_COMMUNITY): Payer: Self-pay

## 2021-05-11 DIAGNOSIS — C8258 Diffuse follicle center lymphoma, lymph nodes of multiple sites: Secondary | ICD-10-CM

## 2021-05-11 DIAGNOSIS — C8294 Follicular lymphoma, unspecified, lymph nodes of axilla and upper limb: Secondary | ICD-10-CM

## 2021-05-11 NOTE — Telephone Encounter (Signed)
[facility] called with results for Darius Smith MRI Brain w wo contrast.   Several small enhancing lesions with edema but no significant mass effect. 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 Likely involving brain parenchyma but given peripheral location, leptomeningeal involvement is not excluded   Dr. Delton Coombes has results in hand and aware of situation.

## 2021-05-11 NOTE — Progress Notes (Signed)
PET scan order placed per Dr. Delton Coombes. I have called the wife and made her aware of the results of the MRI. We discussed the use of steroids to help decrease patient symptoms. Wife aware and verbalized understanding.

## 2021-05-13 ENCOUNTER — Encounter (HOSPITAL_COMMUNITY): Payer: Self-pay

## 2021-05-13 ENCOUNTER — Other Ambulatory Visit (HOSPITAL_COMMUNITY): Payer: Self-pay

## 2021-05-13 MED ORDER — DEXAMETHASONE 2 MG PO TABS: 2.0000 mg | ORAL_TABLET | Freq: Two times a day (BID) | ORAL | 0 refills | Status: DC

## 2021-05-13 NOTE — Telephone Encounter (Signed)
Order received for decadron 2mg  BID starting after PET scan per Dr. Delton Coombes. MyChart message sent explaining instructions.

## 2021-05-14 ENCOUNTER — Other Ambulatory Visit: Payer: Self-pay

## 2021-05-14 ENCOUNTER — Encounter (HOSPITAL_COMMUNITY)
Admission: RE | Admit: 2021-05-14 | Discharge: 2021-05-14 | Disposition: A | Discharge status: Home / Self Care | Payer: Medicare Other | Source: Ambulatory Visit | Attending: Hematology | Admitting: Hematology

## 2021-05-14 DIAGNOSIS — I2510 Atherosclerotic heart disease of native coronary artery without angina pectoris: Secondary | ICD-10-CM | POA: Diagnosis not present

## 2021-05-14 DIAGNOSIS — N281 Cyst of kidney, acquired: Secondary | ICD-10-CM | POA: Diagnosis not present

## 2021-05-14 DIAGNOSIS — C8258 Diffuse follicle center lymphoma, lymph nodes of multiple sites: Secondary | ICD-10-CM | POA: Diagnosis not present

## 2021-05-14 DIAGNOSIS — C8590 Non-Hodgkin lymphoma, unspecified, unspecified site: Secondary | ICD-10-CM | POA: Diagnosis not present

## 2021-05-14 DIAGNOSIS — C8294 Follicular lymphoma, unspecified, lymph nodes of axilla and upper limb: Secondary | ICD-10-CM | POA: Insufficient documentation

## 2021-05-14 DIAGNOSIS — J9859 Other diseases of mediastinum, not elsewhere classified: Secondary | ICD-10-CM | POA: Diagnosis not present

## 2021-05-14 MED ORDER — FLUDEOXYGLUCOSE F - 18 (FDG) INJECTION
9.2300 | Freq: Once | INTRAVENOUS | Status: AC | PRN
  Administered 2021-05-14: 9.23 via INTRAVENOUS

## 2021-05-19 ENCOUNTER — Encounter (HOSPITAL_COMMUNITY): Payer: Self-pay

## 2021-05-19 NOTE — Progress Notes (Signed)
PET scan reviewed by Dr. Delton Coombes. Referral placed to Dr. Arnoldo Morale for L Axillary Lymph node excision and biopsy. I have called the patient's wife, Darius Smith, and she is aware. Darius Smith verbalized understanding and appreciative of my call.

## 2021-05-27 ENCOUNTER — Other Ambulatory Visit: Payer: Self-pay | Admitting: Cardiology

## 2021-05-28 ENCOUNTER — Encounter: Payer: Self-pay | Admitting: General Surgery

## 2021-05-28 ENCOUNTER — Other Ambulatory Visit: Payer: Self-pay

## 2021-05-28 ENCOUNTER — Ambulatory Visit: Payer: Medicare Other | Admitting: General Surgery

## 2021-05-28 VITALS — BP 152/75 | HR 57 | Temp 98.4°F | Resp 18 | Wt 181.0 lb

## 2021-05-28 DIAGNOSIS — C8258 Diffuse follicle center lymphoma, lymph nodes of multiple sites: Secondary | ICD-10-CM

## 2021-05-28 NOTE — Patient Instructions (Signed)
Stop Eliquis on Monday, 9/12.

## 2021-05-29 NOTE — Patient Instructions (Signed)
RUDDY SWIRE  05/29/2021     @PREFPERIOPPHARMACY @   Your procedure is scheduled on  06/03/2021.   Report to [facility] at  67 AM   Call this number if you have problems the morning of surgery:  [phone]   Remember:  Do not eat or drink after midnight.     Your last dose of eliquis should be 05/31/2021.    Use your inhaler before you come and bring your rescue inhaler with you.      Take these medicines the morning of surgery with A SIP OF WATER      pacerone, amlodipine, zyrtec, decadron, isosirbide, antivert(if needed), metoprolol, prilosec.     Do not wear jewelry, make-up or nail polish.  Do not wear lotions, powders, or perfumes, or deodorant.  Do not shave 48 hours prior to surgery.  Men may shave face and neck.  Do not bring valuables to the hospital.  [facility] is not responsible for any belongings or valuables.  Contacts, dentures or bridgework may not be worn into surgery.  Leave your suitcase in the car.  After surgery it may be brought to your room.  For patients admitted to the hospital, discharge time will be determined by your treatment team.  Patients discharged the day of surgery will not be allowed to drive home and must have someone with them for 24 hours.    Special instructions:   DO NOT smoke tobacco or vape for 24 hours before your procedure.  Please read over the following fact sheets that you were given. Coughing and Deep Breathing, Surgical Site Infection Prevention, Anesthesia Post-op Instructions, and Care and Recovery After Surgery      Axillary Lymph Node Dissection, Care After The following information offers guidance on how to care for yourself after your procedure. Your health care provider may also give you more specific instructions. If you have problems or questions, contact your health care provider. What can I expect after the procedure? After the procedure, it is common to have: Pain and soreness around your  incision area. Trouble moving your arm or shoulder. A small amount of swelling in your arm. Numbness on the upper and inside parts of your arm. Follow these instructions at home: Medicines Take over-the-counter and prescription medicines only as told by your health care provider. If you were prescribed an antibiotic medicine, take it as told by your health care provider. Do not stop taking the antibiotic even if you start to feel better. Incision care  Follow instructions from your health care provider about how to take care of your incision. Make sure you: Wash your hands with soap and water for at least 20 seconds before and after you change your bandage (dressing). If soap and water are not available, use hand sanitizer. Change your dressing as told by your health care provider. Leave stitches (sutures), skin glue, or adhesive strips in place. These skin closures may need to stay in place for 2 weeks or longer. If adhesive strip edges start to loosen and curl up, you may trim the loose edges. Do not remove adhesive strips completely unless your health care provider tells you to do that. Check your incision area every day for signs of infection. Check for: Warmth. Redness, more swelling or more pain. Fluid or blood. Pus or a bad smell. Activity If you were given a sedative during the procedure, it can affect you for several hours. Do not drive  or operate machinery until your health care provider says that it is safe. Do not drive or use heavy machinery while taking prescription pain medicine. Do not take baths, swim, or use a hot tub until your health care provider approves. Ask your health care provider if you may take showers. You may only be allowed to take sponge baths. Do arm and shoulder exercises as told by your health care provider. This may prevent movement problems and stiffness. Return to your normal activities as told by your health care provider. Ask your health care provider  what activities are safe for you. Avoid any activities that cause pain. General instructions If a drainage tube was left in your breast, care for it as told by your health care provider. The drain may stay in place for up to 7-10 days. Wear a compression garment on your arm as told by your health care provider. This may help to prevent blood clots and reduce swelling in your arm. Do not use any products that contain nicotine or tobacco. These products include cigarettes, chewing tobacco, and vaping devices, such as e-cigarettes. If you need help quitting, ask your health care provider. Do not have your blood pressure taken, have blood drawn, or get injections or IVs in the arm on the side where your lymph nodes were removed. Follow instructions from your health care provider about how often you should be screened for extra fluid around your lymph nodes (lymphedema). Keep all follow-up visits. This is important. Contact a health care provider if: Your arm is swollen, tight, and painful. You have redness, more swelling, or more pain around your incision. You have fluid or blood coming from your incision. Your incision feels warm to the touch. You have pus or a bad smell coming from your incision. Get help right away if: You have severe pain that does not get better with medicine. You have a fever or chills. You feel confused or disoriented. You have chest pain. You have shortness of breath. These symptoms may represent a serious problem that is an emergency. Do not wait to see if the symptoms will go away. Get medical help right away. Call your local emergency services (911 in the [place] Do not drive yourself to the hospital. Summary After the procedure, it is common to have pain and soreness and trouble moving your arm or shoulder. A small amount of arm swelling is normal after the procedure. However, you should contact your health care provider if your arm is swollen, tight, and painful. Wear  a compression garment on your arm as told by your health care provider. This may help to prevent blood clots and reduce swelling in your arm. Do arm and shoulder exercises as directed to help prevent movement problems and stiffness. If a drainage tube was left in your breast, care for it as told by your health care provider. This information is not intended to replace advice given to you by your health care provider. Make sure you discuss any questions you have with your health care provider. Document Revised: 06/19/2020 Document Reviewed: 06/19/2020 Elsevier Patient Education  [place] Anesthesia, Adult, Care After This sheet gives you information about how to care for yourself after your procedure. Your health care provider may also give you more specific instructions. If you have problems or questions, contact your health care provider. What can I expect after the procedure? After the procedure, the following side effects are common: Pain or discomfort at the IV site. Nausea.  Vomiting. Sore throat. Trouble concentrating. Feeling cold or chills. Feeling weak or tired. Sleepiness and fatigue. Soreness and body aches. These side effects can affect parts of the body that were not involved in surgery. Follow these instructions at home: For the time period you were told by your health care provider:  Rest. Do not participate in activities where you could fall or become injured. Do not drive or use machinery. Do not drink alcohol. Do not take sleeping pills or medicines that cause drowsiness. Do not make important decisions or sign legal documents. Do not take care of children on your own. Eating and drinking Follow any instructions from your health care provider about eating or drinking restrictions. When you feel hungry, start by eating small amounts of foods that are soft and easy to digest (bland), such as toast. Gradually return to your regular diet. Drink enough  fluid to keep your urine pale yellow. If you vomit, rehydrate by drinking water, juice, or clear broth. General instructions If you have sleep apnea, surgery and certain medicines can increase your risk for breathing problems. Follow instructions from your health care provider about wearing your sleep device: Anytime you are sleeping, including during daytime naps. While taking prescription pain medicines, sleeping medicines, or medicines that make you drowsy. Have a responsible adult stay with you for the time you are told. It is important to have someone help care for you until you are awake and alert. Return to your normal activities as told by your health care provider. Ask your health care provider what activities are safe for you. Take over-the-counter and prescription medicines only as told by your health care provider. If you smoke, do not smoke without supervision. Keep all follow-up visits as told by your health care provider. This is important. Contact a health care provider if: You have nausea or vomiting that does not get better with medicine. You cannot eat or drink without vomiting. You have pain that does not get better with medicine. You are unable to pass urine. You develop a skin rash. You have a fever. You have redness around your IV site that gets worse. Get help right away if: You have difficulty breathing. You have chest pain. You have blood in your urine or stool, or you vomit blood. Summary After the procedure, it is common to have a sore throat or nausea. It is also common to feel tired. Have a responsible adult stay with you for the time you are told. It is important to have someone help care for you until you are awake and alert. When you feel hungry, start by eating small amounts of foods that are soft and easy to digest (bland), such as toast. Gradually return to your regular diet. Drink enough fluid to keep your urine pale yellow. Return to your normal  activities as told by your health care provider. Ask your health care provider what activities are safe for you. This information is not intended to replace advice given to you by your health care provider. Make sure you discuss any questions you have with your health care provider. Document Revised: 05/22/2020 Document Reviewed: 12/20/2019 Elsevier Patient Education  2022 [place]. How to Use Chlorhexidine for Bathing Chlorhexidine gluconate (CHG) is a germ-killing (antiseptic) solution that is used to clean the skin. It can get rid of the bacteria that normally live on the skin and can keep them away for about 24 hours. To clean your skin with CHG, you may be given: A CHG  solution to use in the shower or as part of a sponge bath. A prepackaged cloth that contains CHG. Cleaning your skin with CHG may help lower the risk for infection: While you are staying in the intensive care unit of the hospital. If you have a vascular access, such as a central line, to provide short-term or long-term access to your veins. If you have a catheter to drain urine from your bladder. If you are on a ventilator. A ventilator is a machine that helps you breathe by moving air in and out of your lungs. After surgery. What are the risks? Risks of using CHG include: A skin reaction. Hearing loss, if CHG gets in your ears and you have a perforated eardrum. Eye injury, if CHG gets in your eyes and is not rinsed out. The CHG product catching fire. Make sure that you avoid smoking and flames after applying CHG to your skin. Do not use CHG: If you have a chlorhexidine allergy or have previously reacted to chlorhexidine. On babies younger than 67 months of age. How to use CHG solution Use CHG only as told by your health care provider, and follow the instructions on the label. Use the full amount of CHG as directed. Usually, this is one bottle. During a shower Follow these steps when using CHG solution during a  shower (unless your health care provider gives you different instructions): Start the shower. Use your normal soap and shampoo to wash your face and hair. Turn off the shower or move out of the shower stream. Pour the CHG onto a clean washcloth. Do not use any type of brush or rough-edged sponge. Starting at your neck, lather your body down to your toes. Make sure you follow these instructions: If you will be having surgery, pay special attention to the part of your body where you will be having surgery. Scrub this area for at least 1 minute. Do not use CHG on your head or face. If the solution gets into your ears or eyes, rinse them well with water. Avoid your genital area. Avoid any areas of skin that have broken skin, cuts, or scrapes. Scrub your back and under your arms. Make sure to wash skin folds. Let the lather sit on your skin for 1-2 minutes or as long as told by your health care provider. Thoroughly rinse your entire body in the shower. Make sure that all body creases and crevices are rinsed well. Dry off with a clean towel. Do not put any substances on your body afterward--such as powder, lotion, or perfume--unless you are told to do so by your health care provider. Only use lotions that are recommended by the manufacturer. Put on clean clothes or pajamas. If it is the night before your surgery, sleep in clean sheets.  During a sponge bath Follow these steps when using CHG solution during a sponge bath (unless your health care provider gives you different instructions): Use your normal soap and shampoo to wash your face and hair. Pour the CHG onto a clean washcloth. Starting at your neck, lather your body down to your toes. Make sure you follow these instructions: If you will be having surgery, pay special attention to the part of your body where you will be having surgery. Scrub this area for at least 1 minute. Do not use CHG on your head or face. If the solution gets into your  ears or eyes, rinse them well with water. Avoid your genital area. Avoid any areas of  skin that have broken skin, cuts, or scrapes. Scrub your back and under your arms. Make sure to wash skin folds. Let the lather sit on your skin for 1-2 minutes or as long as told by your health care provider. Using a different clean, wet washcloth, thoroughly rinse your entire body. Make sure that all body creases and crevices are rinsed well. Dry off with a clean towel. Do not put any substances on your body afterward--such as powder, lotion, or perfume--unless you are told to do so by your health care provider. Only use lotions that are recommended by the manufacturer. Put on clean clothes or pajamas. If it is the night before your surgery, sleep in clean sheets. How to use CHG prepackaged cloths Only use CHG cloths as told by your health care provider, and follow the instructions on the label. Use the CHG cloth on clean, dry skin. Do not use the CHG cloth on your head or face unless your health care provider tells you to. When washing with the CHG cloth: Avoid your genital area. Avoid any areas of skin that have broken skin, cuts, or scrapes. Before surgery Follow these steps when using a CHG cloth to clean before surgery (unless your health care provider gives you different instructions): Using the CHG cloth, vigorously scrub the part of your body where you will be having surgery. Scrub using a back-and-forth motion for 3 minutes. The area on your body should be completely wet with CHG when you are done scrubbing. Do not rinse. Discard the cloth and let the area air-dry. Do not put any substances on the area afterward, such as powder, lotion, or perfume. Put on clean clothes or pajamas. If it is the night before your surgery, sleep in clean sheets.  For general bathing Follow these steps when using CHG cloths for general bathing (unless your health care provider gives you different instructions). Use a  separate CHG cloth for each area of your body. Make sure you wash between any folds of skin and between your fingers and toes. Wash your body in the following order, switching to a new cloth after each step: The front of your neck, shoulders, and chest. Both of your arms, under your arms, and your hands. Your stomach and groin area, avoiding the genitals. Your right leg and foot. Your left leg and foot. The back of your neck, your back, and your buttocks. Do not rinse. Discard the cloth and let the area air-dry. Do not put any substances on your body afterward--such as powder, lotion, or perfume--unless you are told to do so by your health care provider. Only use lotions that are recommended by the manufacturer. Put on clean clothes or pajamas. Contact a health care provider if: Your skin gets irritated after scrubbing. You have questions about using your solution or cloth. You swallow any chlorhexidine. Call your local poison control center (1-857-741-6060 in the [place] Get help right away if: Your eyes itch badly, or they become very red or swollen. Your skin itches badly and is red or swollen. Your hearing changes. You have trouble seeing. You have swelling or tingling in your mouth or throat. You have trouble breathing. These symptoms may represent a serious problem that is an emergency. Do not wait to see if the symptoms will go away. Get medical help right away. Call your local emergency services (911 in the [place] Do not drive yourself to the hospital. Summary Chlorhexidine gluconate (CHG) is a germ-killing (antiseptic) solution that is used  to clean the skin. Cleaning your skin with CHG may help to lower your risk for infection. You may be given CHG to use for bathing. It may be in a bottle or in a prepackaged cloth to use on your skin. Carefully follow your health care provider's instructions and the instructions on the product label. Do not use CHG if you have a chlorhexidine  allergy. Contact your health care provider if your skin gets irritated after scrubbing. This information is not intended to replace advice given to you by your health care provider. Make sure you discuss any questions you have with your health care provider. Document Revised: 11/17/2020 Document Reviewed: 11/17/2020 Elsevier Patient Education  2022 [employer].

## 2021-05-29 NOTE — H&P (Signed)
Darius Smith; [id number]; [birth year]   HPI Patient is a 75 year old white male with a history of lymphoma who was referred to my care by Dr. Delton Smith of oncology for a left axillary lymph node biopsy.  Please see Dr. Tomie Smith notes concerning his disease.  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  PET scan was performed which revealed significant lymphadenopathy, most prominent in the left axilla.  Patient is on Eliquis for atrial fibrillation Past Medical History:  Diagnosis Date   Allergic rhinitis 07/02/2019   Arthritis    both hands   Atrial fibrillation (Darius Smith) 10/28/2017   Dr. Harl Smith   Atrial fibrillation with RVR ([place]) 07/14/2020   Blood in urine    occ   Bronchitis    Cancer ([place])    Basal cell   Cardiomegaly 28/31/5176   Complication of anesthesia    [name] developed a. fib in recovery after cysto procedure 10/2017 was transferred to [facility] and underwent successful cardioversion   Diverticulosis of sigmoid colon 08/2017   Noted on colonoscopy   Dysrhythmia    External hemorrhoids 08/2017   GERD (gastroesophageal reflux disease)    History of colon polyps 08/2017   History of kidney stones    History of rectal bleeding    History of right inguinal hernia    Hypertension    Lymphoma ([place])    Nephrolithiasis 07/02/2019   Rectal bleeding 09/02/2017   Added automatically from request for surgery [id number]   Shortness of breath     Past Surgical History:  Procedure Laterality Date   CARDIOVERSION     10/2017   CARDIOVERSION N/A 07/16/2020   Procedure: CARDIOVERSION;  Surgeon: Darius Lenis, MD;  [facility];  Service: Endoscopy;  Laterality: N/A;   CARDIOVERSION N/A 08/21/2020   Procedure: CARDIOVERSION;  Surgeon: Darius Sark, MD;  Location: [facility] OR[facility];  Service: Cardiovascular;  Laterality: N/A;   CARDIOVERSION N/A 09/10/2020   Procedure: CARDIOVERSION;  Surgeon: Darius Hector, MD;  Location: [facility] ENDOSCOPY;  Service:  Cardiovascular;  Laterality: N/A;   CHOLECYSTECTOMY     CLEFT LIP REPAIR     several from childhood til 75 years old   [place]     several from childhood til 75 years old   COLONOSCOPY  04/28/2011   Procedure: COLONOSCOPY;  Surgeon: Darius Houston, MD;  [facility];  Service: Endoscopy;  Laterality: N/A;   COLONOSCOPY N/A 07/18/2014   Procedure: COLONOSCOPY;  Surgeon: Darius Houston, MD;  [facility];  Service: Endoscopy;  Laterality: N/A;  830   COLONOSCOPY N/A 09/09/2017   Procedure: COLONOSCOPY;  Surgeon: Darius Houston, MD;  [facility];  Service: Endoscopy;  Laterality: N/A;  955   CYSTOSCOPY/URETEROSCOPY/HOLMIUM LASER/STENT PLACEMENT Bilateral 12/06/2017   Procedure: CYSTOSCOPY/RETROGRADE/URETEROSCOPY/HOLMIUM LASER/STENT EXCHANGE;  Surgeon: Darius Aloe, MD;  Location: [facility]S;  Service: Urology;  Laterality: Bilateral;  ONLY NEEDS 60 MIN   HERNIA REPAIR     POLYPECTOMY  09/09/2017   Procedure: POLYPECTOMY;  Surgeon: Darius Houston, MD;  [facility];  Service: Endoscopy;;  colon   vocal cord surgery      Family History  Problem Relation Age of Onset   Breast cancer Mother    Prostate cancer Father    COPD Sister     Current Outpatient Medications on File Prior to Visit  Medication Sig Dispense Refill   acetaminophen (TYLENOL) 500 MG tablet Take 1,000 mg by  mouth every 6 (six) hours as needed for moderate pain or headache.     amiodarone (PACERONE) 200 MG tablet Take 1 tablet (200 mg total) by mouth daily. 60 tablet 6   amLODipine (NORVASC) 10 MG tablet Take 1 tablet (10 mg total) by mouth daily. 90 tablet 3   cetirizine (ZYRTEC) 10 MG tablet Take 1 tablet (10 mg total) by mouth daily as needed for allergies. 90 tablet 2   dexamethasone (DECADRON) 2 MG tablet Take 1 tablet (2 mg total) by mouth 2 (two) times daily. 60 tablet 0   ELIQUIS 5 MG TABS tablet TAKE ONE TABLET BY MOUTH 2 TIMES A DAY. 60 tablet 5    furosemide (LASIX) 20 MG tablet Take 1 tablet (20 mg total) by mouth every other day. (MAY TAKE DAILY AS NEEDED FOR INCREASED SWELLING)     hydrALAZINE (APRESOLINE) 100 MG tablet TAKE (1) TABLET BY MOUTH (3) TIMES DAILY. (Patient taking differently: Take 100 mg by mouth 2 (two) times daily.) 270 tablet 1   isosorbide mononitrate (IMDUR) 30 MG 24 hr tablet TAKE 1/2 TABLET BY MOUTH ONCE DAILY. 90 tablet 3   meclizine (ANTIVERT) 25 MG tablet Take 1 tablet (25 mg total) by mouth every 6 (six) hours as needed for dizziness. 30 tablet 0   metoprolol succinate (TOPROL XL) 25 MG 24 hr tablet Take 1 tablet (25 mg total) by mouth daily. 90 tablet 3   omeprazole (PRILOSEC) 20 MG capsule Take 20 mg by mouth daily as needed (reflux).     potassium chloride SA (KLOR-CON) 20 MEQ tablet Take 1 tablet (20 mEq total) by mouth daily. 90 tablet 3   spironolactone (ALDACTONE) 25 MG tablet TAKE 1 TABLET BY MOUTH ONCE DAILY. 90 tablet 0   VENTOLIN HFA 108 (90 Base) MCG/ACT inhaler Inhale 2 puffs into the lungs every 6 (six) hours as needed for wheezing or shortness of breath. 18 g 3   No current facility-administered medications on file prior to visit.    Allergies  Allergen Reactions   Augmentin [Amoxicillin-Pot Clavulanate] Other (See Comments)    Increased liver enzymes Has patient had a PCN reaction causing immediate rash, facial/tongue/throat swelling, SOB or lightheadedness with hypotension: No Has patient had a PCN reaction causing severe rash involving mucus membranes or skin necrosis: No Has patient had a PCN reaction that required hospitalization: No Has patient had a PCN reaction occurring within the last 10 years: No  If all of the above answers are "NO", then may proceed with Cephalosporin use.    Flomax [Tamsulosin Hcl] Nausea Only    Dizziness  Pt is on flomax    Social History   Substance and Sexual Activity  Alcohol Use Not Currently   Alcohol/week: 7.0 standard drinks   Types: 7 Cans of  beer per week    Social History   Tobacco Use  Smoking Status Never  Smokeless Tobacco Never    Review of Systems  Constitutional:  Positive for malaise/fatigue.  HENT: Negative.    Eyes: Negative.   Respiratory: Negative.    Cardiovascular: Negative.   Gastrointestinal: Negative.   Genitourinary: Negative.   Musculoskeletal: Negative.   Skin: Negative.   Neurological:  Positive for dizziness and tremors.  Endo/Heme/Allergies:  Bruises/bleeds easily.  Psychiatric/Behavioral: Negative.     Objective   Vitals:   05/28/21 1053  BP: (!) 152/75  Pulse: (!) 57  Resp: 18  Temp: 98.4 F (36.9 C)  SpO2: 93%    Physical Exam Vitals reviewed.  Constitutional:      Appearance: Normal appearance. He is not ill-appearing.  HENT:     Head: Normocephalic and atraumatic.  Cardiovascular:     Rate and Rhythm: Normal rate. Rhythm irregular.     Heart sounds: Normal heart sounds. No murmur heard.   No gallop.  Pulmonary:     Effort: Pulmonary effort is normal. No respiratory distress.     Breath sounds: Normal breath sounds. No stridor. No wheezing, rhonchi or rales.  Skin:    General: Skin is warm and dry.  Neurological:     Mental Status: He is alert and oriented to person, place, and time.  Easily palpable lymphadenopathy in the left axilla. Dr. Tomie Smith notes reviewed.  CT scan of axilla reviewed Assessment  Lymphadenopathy, history of lymphoma Plan  Patient is scheduled for a left axillary lymph node biopsy on 06/03/2021.  The risks and benefits of the procedure including bleeding and infection were fully explained to the patient, who gave informed consent.  He will stop his Eliquis 2 days prior to the procedure.

## 2021-05-29 NOTE — Progress Notes (Signed)
Darius Smith; [id number]; 15-Mar-1946   HPI Patient is a 75 year old white male with a history of lymphoma who was referred to my care by Dr. Delton Coombes of oncology for a left axillary lymph node biopsy.  Please see Dr. Tomie China notes concerning his disease.  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  PET scan was performed which revealed significant lymphadenopathy, most prominent in the left axilla.  Patient is on Eliquis for atrial fibrillation Past Medical History:  Diagnosis Date   Allergic rhinitis 07/02/2019   Arthritis    both hands   Atrial fibrillation ([place]) 10/28/2017   Dr. Harl Bowie   Atrial fibrillation with RVR ([place]) 07/14/2020   Blood in urine    occ   Bronchitis    Cancer ([place])    Basal cell   Cardiomegaly 17/00/1749   Complication of anesthesia    Mr. Swiss developed a. fib in recovery after cysto procedure 10/2017 was transferred to [facility] and underwent successful cardioversion   Diverticulosis of sigmoid colon 08/2017   Noted on colonoscopy   Dysrhythmia    External hemorrhoids 08/2017   GERD (gastroesophageal reflux disease)    History of colon polyps 08/2017   History of kidney stones    History of rectal bleeding    History of right inguinal hernia    Hypertension    Lymphoma ([place])    Nephrolithiasis 07/02/2019   Rectal bleeding 09/02/2017   Added automatically from request for surgery [id number]   Shortness of breath     Past Surgical History:  Procedure Laterality Date   CARDIOVERSION     10/2017   CARDIOVERSION N/A 07/16/2020   Procedure: CARDIOVERSION;  Surgeon: Arnoldo Lenis, MD;  [facility];  Service: Endoscopy;  Laterality: N/A;   CARDIOVERSION N/A 08/21/2020   Procedure: CARDIOVERSION;  Surgeon: Satira Sark, MD;  [facility];  Service: Cardiovascular;  Laterality: N/A;   CARDIOVERSION N/A 09/10/2020   Procedure: CARDIOVERSION;  Surgeon: Josue Hector, MD;  Location: [facility] ENDOSCOPY;  Service:  Cardiovascular;  Laterality: N/A;   CHOLECYSTECTOMY     CLEFT LIP REPAIR     several from childhood til 75 years old   [place]     several from childhood til 75 years old   COLONOSCOPY  04/28/2011   Procedure: COLONOSCOPY;  Surgeon: Rogene Houston, MD;  [facility];  Service: Endoscopy;  Laterality: N/A;   COLONOSCOPY N/A 07/18/2014   Procedure: COLONOSCOPY;  Surgeon: Rogene Houston, MD;  [facility];  Service: Endoscopy;  Laterality: N/A;  830   COLONOSCOPY N/A 09/09/2017   Procedure: COLONOSCOPY;  Surgeon: Rogene Houston, MD;  [facility];  Service: Endoscopy;  Laterality: N/A;  955   CYSTOSCOPY/URETEROSCOPY/HOLMIUM LASER/STENT PLACEMENT Bilateral 12/06/2017   Procedure: CYSTOSCOPY/RETROGRADE/URETEROSCOPY/HOLMIUM LASER/STENT EXCHANGE;  Surgeon: Festus Aloe, MD;  [facility];  Service: Urology;  Laterality: Bilateral;  ONLY NEEDS 60 MIN   HERNIA REPAIR     POLYPECTOMY  09/09/2017   Procedure: POLYPECTOMY;  Surgeon: Rogene Houston, MD;  [facility];  Service: Endoscopy;;  colon   vocal cord surgery      Family History  Problem Relation Age of Onset   Breast cancer Mother    Prostate cancer Father    COPD Sister     Current Outpatient Medications on File Prior to Visit  Medication Sig Dispense Refill   acetaminophen (TYLENOL) 500 MG tablet Take 1,000 mg by  mouth every 6 (six) hours as needed for moderate pain or headache.     amiodarone (PACERONE) 200 MG tablet Take 1 tablet (200 mg total) by mouth daily. 60 tablet 6   amLODipine (NORVASC) 10 MG tablet Take 1 tablet (10 mg total) by mouth daily. 90 tablet 3   cetirizine (ZYRTEC) 10 MG tablet Take 1 tablet (10 mg total) by mouth daily as needed for allergies. 90 tablet 2   dexamethasone (DECADRON) 2 MG tablet Take 1 tablet (2 mg total) by mouth 2 (two) times daily. 60 tablet 0   ELIQUIS 5 MG TABS tablet TAKE ONE TABLET BY MOUTH 2 TIMES A DAY. 60 tablet 5    furosemide (LASIX) 20 MG tablet Take 1 tablet (20 mg total) by mouth every other day. (MAY TAKE DAILY AS NEEDED FOR INCREASED SWELLING)     hydrALAZINE (APRESOLINE) 100 MG tablet TAKE (1) TABLET BY MOUTH (3) TIMES DAILY. (Patient taking differently: Take 100 mg by mouth 2 (two) times daily.) 270 tablet 1   isosorbide mononitrate (IMDUR) 30 MG 24 hr tablet TAKE 1/2 TABLET BY MOUTH ONCE DAILY. 90 tablet 3   meclizine (ANTIVERT) 25 MG tablet Take 1 tablet (25 mg total) by mouth every 6 (six) hours as needed for dizziness. 30 tablet 0   metoprolol succinate (TOPROL XL) 25 MG 24 hr tablet Take 1 tablet (25 mg total) by mouth daily. 90 tablet 3   omeprazole (PRILOSEC) 20 MG capsule Take 20 mg by mouth daily as needed (reflux).     potassium chloride SA (KLOR-CON) 20 MEQ tablet Take 1 tablet (20 mEq total) by mouth daily. 90 tablet 3   spironolactone (ALDACTONE) 25 MG tablet TAKE 1 TABLET BY MOUTH ONCE DAILY. 90 tablet 0   VENTOLIN HFA 108 (90 Base) MCG/ACT inhaler Inhale 2 puffs into the lungs every 6 (six) hours as needed for wheezing or shortness of breath. 18 g 3   No current facility-administered medications on file prior to visit.    Allergies  Allergen Reactions   Augmentin [Amoxicillin-Pot Clavulanate] Other (See Comments)    Increased liver enzymes Has patient had a PCN reaction causing immediate rash, facial/tongue/throat swelling, SOB or lightheadedness with hypotension: No Has patient had a PCN reaction causing severe rash involving mucus membranes or skin necrosis: No Has patient had a PCN reaction that required hospitalization: No Has patient had a PCN reaction occurring within the last 10 years: No  If all of the above answers are "NO", then may proceed with Cephalosporin use.    Flomax [Tamsulosin Hcl] Nausea Only    Dizziness  Pt is on flomax    Social History   Substance and Sexual Activity  Alcohol Use Not Currently   Alcohol/week: 7.0 standard drinks   Types: 7 Cans of  beer per week    Social History   Tobacco Use  Smoking Status Never  Smokeless Tobacco Never    Review of Systems  Constitutional:  Positive for malaise/fatigue.  HENT: Negative.    Eyes: Negative.   Respiratory: Negative.    Cardiovascular: Negative.   Gastrointestinal: Negative.   Genitourinary: Negative.   Musculoskeletal: Negative.   Skin: Negative.   Neurological:  Positive for dizziness and tremors.  Endo/Heme/Allergies:  Bruises/bleeds easily.  Psychiatric/Behavioral: Negative.     Objective   Vitals:   05/28/21 1053  BP: (!) 152/75  Pulse: (!) 57  Resp: 18  Temp: 98.4 F (36.9 C)  SpO2: 93%    Physical Exam Vitals reviewed.  Constitutional:      Appearance: Normal appearance. He is not ill-appearing.  HENT:     Head: Normocephalic and atraumatic.  Cardiovascular:     Rate and Rhythm: Normal rate. Rhythm irregular.     Heart sounds: Normal heart sounds. No murmur heard.   No gallop.  Pulmonary:     Effort: Pulmonary effort is normal. No respiratory distress.     Breath sounds: Normal breath sounds. No stridor. No wheezing, rhonchi or rales.  Skin:    General: Skin is warm and dry.  Neurological:     Mental Status: He is alert and oriented to person, place, and time.  Easily palpable lymphadenopathy in the left axilla. Dr. Tomie China notes reviewed.  CT scan of axilla reviewed Assessment  Lymphadenopathy, history of lymphoma Plan  Patient is scheduled for a left axillary lymph node biopsy on 06/03/2021.  The risks and benefits of the procedure including bleeding and infection were fully explained to the patient, who gave informed consent.  He will stop his Eliquis 2 days prior to the procedure.

## 2021-06-01 ENCOUNTER — Observation Stay (HOSPITAL_COMMUNITY)
Admission: EM | Admit: 2021-06-01 | Discharge: 2021-06-02 | Disposition: A | Discharge status: Home / Self Care | Payer: Medicare Other | Attending: Internal Medicine | Admitting: Internal Medicine

## 2021-06-01 ENCOUNTER — Other Ambulatory Visit (HOSPITAL_COMMUNITY): Payer: Self-pay

## 2021-06-01 ENCOUNTER — Emergency Department (HOSPITAL_COMMUNITY): Payer: Medicare Other

## 2021-06-01 ENCOUNTER — Encounter (HOSPITAL_COMMUNITY): Payer: Self-pay

## 2021-06-01 ENCOUNTER — Other Ambulatory Visit: Payer: Self-pay

## 2021-06-01 ENCOUNTER — Encounter (HOSPITAL_COMMUNITY)
Admission: RE | Admit: 2021-06-01 | Discharge: 2021-06-01 | Disposition: A | Discharge status: Home / Self Care | Payer: Medicare Other | Source: Ambulatory Visit | Attending: General Surgery | Admitting: General Surgery

## 2021-06-01 DIAGNOSIS — Z20822 Contact with and (suspected) exposure to covid-19: Secondary | ICD-10-CM | POA: Insufficient documentation

## 2021-06-01 DIAGNOSIS — I130 Hypertensive heart and chronic kidney disease with heart failure and stage 1 through stage 4 chronic kidney disease, or unspecified chronic kidney disease: Secondary | ICD-10-CM | POA: Diagnosis not present

## 2021-06-01 DIAGNOSIS — I10 Essential (primary) hypertension: Secondary | ICD-10-CM | POA: Diagnosis present

## 2021-06-01 DIAGNOSIS — N1832 Chronic kidney disease, stage 3b: Secondary | ICD-10-CM | POA: Insufficient documentation

## 2021-06-01 DIAGNOSIS — Z7901 Long term (current) use of anticoagulants: Secondary | ICD-10-CM | POA: Diagnosis not present

## 2021-06-01 DIAGNOSIS — Z85118 Personal history of other malignant neoplasm of bronchus and lung: Secondary | ICD-10-CM | POA: Diagnosis not present

## 2021-06-01 DIAGNOSIS — N179 Acute kidney failure, unspecified: Secondary | ICD-10-CM | POA: Diagnosis not present

## 2021-06-01 DIAGNOSIS — E875 Hyperkalemia: Secondary | ICD-10-CM | POA: Diagnosis present

## 2021-06-01 DIAGNOSIS — Z79899 Other long term (current) drug therapy: Secondary | ICD-10-CM | POA: Insufficient documentation

## 2021-06-01 DIAGNOSIS — C8590 Non-Hodgkin lymphoma, unspecified, unspecified site: Secondary | ICD-10-CM | POA: Diagnosis present

## 2021-06-01 DIAGNOSIS — I5022 Chronic systolic (congestive) heart failure: Secondary | ICD-10-CM | POA: Insufficient documentation

## 2021-06-01 DIAGNOSIS — Z01812 Encounter for preprocedural laboratory examination: Secondary | ICD-10-CM | POA: Insufficient documentation

## 2021-06-01 DIAGNOSIS — D72828 Other elevated white blood cell count: Secondary | ICD-10-CM | POA: Insufficient documentation

## 2021-06-01 DIAGNOSIS — I4891 Unspecified atrial fibrillation: Secondary | ICD-10-CM | POA: Diagnosis not present

## 2021-06-01 DIAGNOSIS — Z85828 Personal history of other malignant neoplasm of skin: Secondary | ICD-10-CM | POA: Insufficient documentation

## 2021-06-01 DIAGNOSIS — K219 Gastro-esophageal reflux disease without esophagitis: Secondary | ICD-10-CM | POA: Diagnosis present

## 2021-06-01 DIAGNOSIS — R531 Weakness: Secondary | ICD-10-CM | POA: Diagnosis not present

## 2021-06-01 DIAGNOSIS — C8290 Follicular lymphoma, unspecified, unspecified site: Secondary | ICD-10-CM | POA: Diagnosis present

## 2021-06-01 DIAGNOSIS — I5042 Chronic combined systolic (congestive) and diastolic (congestive) heart failure: Secondary | ICD-10-CM | POA: Diagnosis present

## 2021-06-01 HISTORY — DX: Heart failure, unspecified: I50.9

## 2021-06-01 HISTORY — DX: Chronic kidney disease, stage 3 unspecified: N18.30

## 2021-06-01 LAB — CBC WITH DIFFERENTIAL/PLATELET
Abs Immature Granulocytes: 0.58 10*3/uL — ABNORMAL HIGH (ref 0.00–0.07)
Abs Immature Granulocytes: 0.59 10*3/uL — ABNORMAL HIGH (ref 0.00–0.07)
Basophils Absolute: 0.1 10*3/uL (ref 0.0–0.1)
Basophils Absolute: 0.1 10*3/uL (ref 0.0–0.1)
Basophils Relative: 0 %
Basophils Relative: 0 %
Eosinophils Absolute: 0 10*3/uL (ref 0.0–0.5)
Eosinophils Absolute: 0 10*3/uL (ref 0.0–0.5)
Eosinophils Relative: 0 %
Eosinophils Relative: 0 %
HCT: 46 % (ref 39.0–52.0)
HCT: 45.5 % (ref 39.0–52.0)
Hemoglobin: 15.5 g/dL (ref 13.0–17.0)
Hemoglobin: 15.2 g/dL (ref 13.0–17.0)
Immature Granulocytes: 2 %
Immature Granulocytes: 2 %
Lymphocytes Relative: 2 %
Lymphocytes Relative: 2 %
Lymphs Abs: 0.5 10*3/uL — ABNORMAL LOW (ref 0.7–4.0)
Lymphs Abs: 0.4 10*3/uL — ABNORMAL LOW (ref 0.7–4.0)
MCH: 31.2 pg (ref 26.0–34.0)
MCH: 31.1 pg (ref 26.0–34.0)
MCHC: 33.7 g/dL (ref 30.0–36.0)
MCHC: 33.4 g/dL (ref 30.0–36.0)
MCV: 92.6 fL (ref 80.0–100.0)
MCV: 93 fL (ref 80.0–100.0)
Monocytes Absolute: 1 10*3/uL (ref 0.1–1.0)
Monocytes Absolute: 1.1 10*3/uL — ABNORMAL HIGH (ref 0.1–1.0)
Monocytes Relative: 4 %
Monocytes Relative: 5 %
Neutro Abs: 22.5 10*3/uL — ABNORMAL HIGH (ref 1.7–7.7)
Neutro Abs: 23.1 10*3/uL — ABNORMAL HIGH (ref 1.7–7.7)
Neutrophils Relative %: 92 %
Neutrophils Relative %: 91 %
Platelets: 199 10*3/uL (ref 150–400)
Platelets: 225 10*3/uL (ref 150–400)
RBC: 4.97 MIL/uL (ref 4.22–5.81)
RBC: 4.89 MIL/uL (ref 4.22–5.81)
RDW: 12.4 % (ref 11.5–15.5)
RDW: 12.5 % (ref 11.5–15.5)
WBC: 24.6 10*3/uL — ABNORMAL HIGH (ref 4.0–10.5)
WBC: 25.3 10*3/uL — ABNORMAL HIGH (ref 4.0–10.5)
nRBC: 0 % (ref 0.0–0.2)
nRBC: 0 % (ref 0.0–0.2)

## 2021-06-01 LAB — COMPREHENSIVE METABOLIC PANEL
ALT: 33 U/L (ref 0–44)
AST: 22 U/L (ref 15–41)
Albumin: 3.3 g/dL — ABNORMAL LOW (ref 3.5–5.0)
Alkaline Phosphatase: 60 U/L (ref 38–126)
Anion gap: 9 (ref 5–15)
BUN: 72 mg/dL — ABNORMAL HIGH (ref 8–23)
CO2: 12 mmol/L — ABNORMAL LOW (ref 22–32)
Calcium: 8.5 mg/dL — ABNORMAL LOW (ref 8.9–10.3)
Chloride: 103 mmol/L (ref 98–111)
Creatinine, Ser: 2.15 mg/dL — ABNORMAL HIGH (ref 0.61–1.24)
GFR, Estimated: 31 mL/min — ABNORMAL LOW (ref >60)
Glucose, Bld: 395 mg/dL — ABNORMAL HIGH (ref 70–99)
Potassium: 6.6 mmol/L (ref 3.5–5.1)
Sodium: 124 mmol/L — ABNORMAL LOW (ref 135–145)
Total Bilirubin: 0.8 mg/dL (ref 0.3–1.2)
Total Protein: 6.8 g/dL (ref 6.5–8.1)

## 2021-06-01 LAB — URINALYSIS, ROUTINE W REFLEX MICROSCOPIC
Bilirubin Urine: NEGATIVE
Glucose, UA: >=500 mg/dL — AB
Hgb urine dipstick: NEGATIVE
Ketones, ur: NEGATIVE mg/dL
Leukocytes,Ua: NEGATIVE
Nitrite: NEGATIVE
Protein, ur: NEGATIVE mg/dL
Specific Gravity, Urine: 1.015 (ref 1.005–1.030)
pH: 5 (ref 5.0–8.0)

## 2021-06-01 LAB — BASIC METABOLIC PANEL
Anion gap: 9 (ref 5–15)
BUN: 70 mg/dL — ABNORMAL HIGH (ref 8–23)
CO2: 15 mmol/L — ABNORMAL LOW (ref 22–32)
Calcium: 8.7 mg/dL — ABNORMAL LOW (ref 8.9–10.3)
Chloride: 104 mmol/L (ref 98–111)
Creatinine, Ser: 2.11 mg/dL — ABNORMAL HIGH (ref 0.61–1.24)
GFR, Estimated: 32 mL/min — ABNORMAL LOW (ref >60)
Glucose, Bld: 434 mg/dL — ABNORMAL HIGH (ref 70–99)
Potassium: 6.8 mmol/L (ref 3.5–5.1)
Sodium: 128 mmol/L — ABNORMAL LOW (ref 135–145)

## 2021-06-01 LAB — CBG MONITORING, ED
Glucose-Capillary: 350 mg/dL — ABNORMAL HIGH (ref 70–99)
Glucose-Capillary: 135 mg/dL — ABNORMAL HIGH (ref 70–99)

## 2021-06-01 LAB — GLUCOSE, RANDOM: Glucose, Bld: 149 mg/dL — ABNORMAL HIGH (ref 70–99)

## 2021-06-01 LAB — LACTATE DEHYDROGENASE: LDH: 182 U/L (ref 98–192)

## 2021-06-01 LAB — RESP PANEL BY RT-PCR (FLU A&B, COVID) ARPGX2
Influenza A by PCR: NEGATIVE
Influenza B by PCR: NEGATIVE
SARS Coronavirus 2 by RT PCR: NEGATIVE

## 2021-06-01 LAB — PHOSPHORUS: Phosphorus: 4.3 mg/dL (ref 2.5–4.6)

## 2021-06-01 LAB — URIC ACID: Uric Acid, Serum: 6.7 mg/dL (ref 3.7–8.6)

## 2021-06-01 MED ORDER — TRAZODONE HCL 50 MG PO TABS: 25.0000 mg | ORAL_TABLET | Freq: Every evening | ORAL | Status: DC | PRN

## 2021-06-01 MED ORDER — POLYETHYLENE GLYCOL 3350 17 G PO PACK: 17.0000 g | PACK | Freq: Every day | ORAL | Status: DC | PRN

## 2021-06-01 MED ORDER — AMIODARONE HCL 200 MG PO TABS
200.0000 mg | ORAL_TABLET | Freq: Every day | ORAL | Status: DC
  Administered 2021-06-02: 200 mg via ORAL
  Filled 2021-06-01: qty 1

## 2021-06-01 MED ORDER — ONDANSETRON HCL 4 MG PO TABS: 4.0000 mg | ORAL_TABLET | Freq: Four times a day (QID) | ORAL | Status: DC | PRN

## 2021-06-01 MED ORDER — FUROSEMIDE 40 MG PO TABS
20.0000 mg | ORAL_TABLET | ORAL | Status: DC
  Filled 2021-06-01 (×2): qty 1

## 2021-06-01 MED ORDER — LORATADINE 10 MG PO TABS
10.0000 mg | ORAL_TABLET | Freq: Every day | ORAL | Status: DC
Start: 2021-06-01 — End: 2021-06-02
  Administered 2021-06-02: 10 mg via ORAL
  Filled 2021-06-01 (×2): qty 1

## 2021-06-01 MED ORDER — AMLODIPINE BESYLATE 5 MG PO TABS
10.0000 mg | ORAL_TABLET | Freq: Every day | ORAL | Status: DC
  Administered 2021-06-02: 10 mg via ORAL
  Filled 2021-06-01 (×2): qty 2

## 2021-06-01 MED ORDER — INSULIN DETEMIR 100 UNIT/ML ~~LOC~~ SOLN
10.0000 [IU] | Freq: Every day | SUBCUTANEOUS | Status: DC
  Administered 2021-06-01: 10 [IU] via SUBCUTANEOUS
  Filled 2021-06-01 (×2): qty 0.1

## 2021-06-01 MED ORDER — ISOSORBIDE MONONITRATE ER 30 MG PO TB24
15.0000 mg | ORAL_TABLET | Freq: Every day | ORAL | Status: DC
  Administered 2021-06-02: 15 mg via ORAL
  Filled 2021-06-01: qty 1

## 2021-06-01 MED ORDER — HYDRALAZINE HCL 25 MG PO TABS
ORAL_TABLET | ORAL | Status: AC
  Filled 2021-06-01: qty 4

## 2021-06-01 MED ORDER — MORPHINE SULFATE (PF) 2 MG/ML IV SOLN
2.0000 mg | INTRAVENOUS | Status: DC | PRN
Start: 2021-06-01 — End: 2021-06-02

## 2021-06-01 MED ORDER — SPIRONOLACTONE 25 MG PO TABS: 25.0000 mg | ORAL_TABLET | Freq: Every day | ORAL | Status: DC

## 2021-06-01 MED ORDER — CALCIUM GLUCONATE-NACL 1-0.675 GM/50ML-% IV SOLN
1.0000 g | Freq: Once | INTRAVENOUS | Status: AC
  Administered 2021-06-01: 1000 mg via INTRAVENOUS
  Filled 2021-06-01: qty 50

## 2021-06-01 MED ORDER — DEXAMETHASONE 2 MG PO TABS: 2.0000 mg | ORAL_TABLET | Freq: Two times a day (BID) | ORAL | 0 refills | Status: DC

## 2021-06-01 MED ORDER — DEXAMETHASONE 4 MG PO TABS
2.0000 mg | ORAL_TABLET | Freq: Two times a day (BID) | ORAL | Status: DC
  Administered 2021-06-02: 2 mg via ORAL
  Filled 2021-06-01: qty 1

## 2021-06-01 MED ORDER — ACETAMINOPHEN 325 MG PO TABS: 650.0000 mg | ORAL_TABLET | Freq: Four times a day (QID) | ORAL | Status: DC | PRN

## 2021-06-01 MED ORDER — INSULIN ASPART 100 UNIT/ML IJ SOLN
0.0000 [IU] | Freq: Three times a day (TID) | INTRAMUSCULAR | Status: DC
  Administered 2021-06-02: 1 [IU] via SUBCUTANEOUS
  Filled 2021-06-01: qty 1

## 2021-06-01 MED ORDER — INSULIN ASPART 100 UNIT/ML IV SOLN
5.0000 [IU] | Freq: Once | INTRAVENOUS | Status: AC
  Administered 2021-06-01: 5 [IU] via INTRAVENOUS

## 2021-06-01 MED ORDER — STERILE WATER FOR INJECTION IV SOLN
INTRAVENOUS | Status: DC
  Filled 2021-06-01 (×4): qty 1000

## 2021-06-01 MED ORDER — ONDANSETRON HCL 4 MG/2ML IJ SOLN: 4.0000 mg | Freq: Four times a day (QID) | INTRAMUSCULAR | Status: DC | PRN

## 2021-06-01 MED ORDER — HYDROCODONE-ACETAMINOPHEN 5-325 MG PO TABS
1.0000 | ORAL_TABLET | ORAL | Status: DC | PRN
  Administered 2021-06-02: 2 via ORAL
  Filled 2021-06-01: qty 2

## 2021-06-01 MED ORDER — ALBUTEROL SULFATE HFA 108 (90 BASE) MCG/ACT IN AERS: 2.0000 | INHALATION_SPRAY | Freq: Four times a day (QID) | RESPIRATORY_TRACT | Status: DC | PRN

## 2021-06-01 MED ORDER — SODIUM BICARBONATE 8.4 % IV SOLN
INTRAVENOUS | Status: AC
  Filled 2021-06-01: qty 150

## 2021-06-01 MED ORDER — SODIUM BICARBONATE 8.4 % IV SOLN
50.0000 meq | Freq: Once | INTRAVENOUS | Status: AC
  Administered 2021-06-01: 50 meq via INTRAVENOUS

## 2021-06-01 MED ORDER — HYDRALAZINE HCL 25 MG PO TABS
100.0000 mg | ORAL_TABLET | Freq: Two times a day (BID) | ORAL | Status: DC
  Administered 2021-06-01 – 2021-06-02 (×2): 100 mg via ORAL
  Filled 2021-06-01: qty 2
  Filled 2021-06-01: qty 4
  Filled 2021-06-01 (×3): qty 2

## 2021-06-01 MED ORDER — SODIUM ZIRCONIUM CYCLOSILICATE 5 G PO PACK
10.0000 g | PACK | Freq: Once | ORAL | Status: AC
  Administered 2021-06-01: 10 g via ORAL
  Filled 2021-06-01: qty 2

## 2021-06-01 MED ORDER — ACETAMINOPHEN 650 MG RE SUPP: 650.0000 mg | Freq: Four times a day (QID) | RECTAL | Status: DC | PRN

## 2021-06-01 MED ORDER — STERILE WATER FOR INJECTION IV SOLN: INTRAVENOUS | Status: DC

## 2021-06-01 MED ORDER — SODIUM CHLORIDE 0.9 % IV BOLUS
1000.0000 mL | Freq: Once | INTRAVENOUS | Status: AC
  Administered 2021-06-01: 1000 mL via INTRAVENOUS

## 2021-06-01 MED ORDER — ALBUTEROL SULFATE (2.5 MG/3ML) 0.083% IN NEBU: 2.5000 mg | INHALATION_SOLUTION | Freq: Four times a day (QID) | RESPIRATORY_TRACT | Status: DC | PRN

## 2021-06-01 MED ORDER — PANTOPRAZOLE SODIUM 40 MG PO TBEC
40.0000 mg | DELAYED_RELEASE_TABLET | Freq: Every day | ORAL | Status: DC
  Administered 2021-06-02: 40 mg via ORAL
  Filled 2021-06-01 (×2): qty 1

## 2021-06-01 MED ORDER — INSULIN ASPART 100 UNIT/ML IJ SOLN
3.0000 [IU] | Freq: Three times a day (TID) | INTRAMUSCULAR | Status: DC
  Administered 2021-06-02: 3 [IU] via SUBCUTANEOUS

## 2021-06-01 MED ORDER — METOPROLOL SUCCINATE ER 25 MG PO TB24
25.0000 mg | ORAL_TABLET | Freq: Every day | ORAL | Status: DC
  Administered 2021-06-02: 25 mg via ORAL
  Filled 2021-06-01 (×2): qty 1

## 2021-06-01 MED ORDER — SODIUM CHLORIDE 0.45 % IV SOLN: INTRAVENOUS | Status: DC

## 2021-06-01 MED ORDER — METOPROLOL TARTRATE 5 MG/5ML IV SOLN: 5.0000 mg | Freq: Four times a day (QID) | INTRAVENOUS | Status: DC | PRN

## 2021-06-01 NOTE — ED Triage Notes (Signed)
Patient instructed to come to ED for abnormal labs after pre-op diagnostics completed this am. States that he was told K+ was elevated. Reports bilateral leg weakness and fall this am due to such.

## 2021-06-01 NOTE — ED Provider Notes (Signed)
[facility] EMERGENCY DEPARTMENT Provider Note   CSN: [id number] Arrival date & time: 06/01/21  1828     History Chief Complaint  Patient presents with   Weakness   Abnormal Lab    Darius Smith is a 75 y.o. male.  HPI     75 year old male comes in with chief complaint of weakness and abnormal lab.  Patient is a history of A. 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  Patient went for preop labs prior to scheduled biopsy -and was advised to come to the ER because of abnormal potassium.  Patient reports that he has been feeling well and the only reason he came to the ER was because he was advised to come in.  His labs indicate elevated blood sugar, potassium and slightly worse renal function.  Patient reports that he has been eating and drinking well.  He denies history of diabetes.  He is informing me that he was started on steroids by his cancer doctor few weeks back.  He does not know the dose, or duration -but soon after he started taking it he has noticed increased urinary frequency.  Patient also had elevated white count.  He denies any fevers, chills, UTI-like symptoms, URI.   Review of system is positive for about 10 to 15 pound weight loss in the last 2 weeks.  Past Medical History:  Diagnosis Date   Allergic rhinitis 07/02/2019   Arthritis    both hands   Atrial fibrillation ([place]) 10/28/2017   Dr. Harl Bowie   Atrial fibrillation with RVR ([place]) 07/14/2020   Blood in urine    occ   Bronchitis    Cancer ([place])    Basal cell   Cardiomegaly 10/28/2017   CHF (congestive heart failure) ([place])    Complication of anesthesia    Mr. Ohms developed a. fib in recovery after cysto procedure 10/2017 was transferred to [facility] and underwent successful cardioversion   Diverticulosis of sigmoid colon 08/2017   Noted on colonoscopy   Dysrhythmia    External hemorrhoids 08/2017   GERD (gastroesophageal reflux  disease)    History of colon polyps 08/2017   History of kidney stones    History of rectal bleeding    History of right inguinal hernia    Hypertension    Lung cancer (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   Transaminitis 08/01/2020   Encounter for support and coordination of transition of care 07/31/2020   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   Annual visit for general adult medical examination with abnormal findings 41/66/0630   Chronic systolic HF (heart failure) ([place]) 16/09/930   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   Atrial fibrillation ([place]) 03/20/2020   Essential hypertension 08/08/2014   GERD (gastroesophageal reflux disease) 08/08/2014    Past Surgical History:  Procedure Laterality Date   CARDIOVERSION     10/2017   CARDIOVERSION N/A 07/16/2020   Procedure: CARDIOVERSION;  Surgeon: Carlyle Dolly  F, MD;  Location: [facility] ENDO SUITE;  Service: Endoscopy;  Laterality: N/A;   CARDIOVERSION N/A 08/21/2020   Procedure: CARDIOVERSION;  Surgeon: Satira Sark, MD;  Location: [facility] ORS;  Service: Cardiovascular;  Laterality: N/A;   CARDIOVERSION N/A 09/10/2020   Procedure: CARDIOVERSION;  Surgeon: Josue Hector, MD;  Location: [facility] ENDOSCOPY;  Service: Cardiovascular;  Laterality: N/A;   CHOLECYSTECTOMY     CLEFT LIP REPAIR     several from childhood til 75 years old   [place]     several from childhood til 75 years old   COLONOSCOPY  04/28/2011   Procedure: COLONOSCOPY;  Surgeon: Rogene Houston,  MD;  Location: [facility] ENDO SUITE;  Service: Endoscopy;  Laterality: N/A;   COLONOSCOPY N/A 07/18/2014   Procedure: COLONOSCOPY;  Surgeon: Rogene Houston, MD;  Location: [facility] ENDO SUITE;  Service: Endoscopy;  Laterality: N/A;  830   COLONOSCOPY N/A 09/09/2017   Procedure: COLONOSCOPY;  Surgeon: Rogene Houston, MD;  Location: [facility] ENDO SUITE;  Service: Endoscopy;  Laterality: N/A;  955   CYSTOSCOPY/URETEROSCOPY/HOLMIUM LASER/STENT PLACEMENT Bilateral 12/06/2017   Procedure: CYSTOSCOPY/RETROGRADE/URETEROSCOPY/HOLMIUM LASER/STENT EXCHANGE;  Surgeon: Festus Aloe, MD;  Location: [facility] ORS;  Service: Urology;  Laterality: Bilateral;  ONLY NEEDS 60 MIN   HERNIA REPAIR     POLYPECTOMY  09/09/2017   Procedure: POLYPECTOMY;  Surgeon: Rogene Houston, MD;  Location: [facility] ENDO SUITE;  Service: Endoscopy;;  colon   vocal cord surgery         Family History  Problem Relation Age of Onset   Breast cancer Mother    Prostate cancer Father    COPD Sister     Social History   Tobacco Use   Smoking status: Never   Smokeless tobacco: Never  Vaping Use   Vaping Use: Never used  Substance Use Topics   Alcohol use: Not Currently    Alcohol/week: 7.0 standard drinks    Types: 7 Cans of beer per week   Drug use: No    Home Medications Prior to Admission medications   Medication Sig Start Date End Date Taking? Authorizing Provider  acetaminophen (TYLENOL) 500 MG tablet Take 1,000 mg by mouth every 6 (six) hours as needed for moderate pain or headache.   Yes [provider]  amiodarone (PACERONE) 200 MG tablet Take 1 tablet (200 mg total) by mouth daily. 02/23/21  Yes Fenton, Clint R, PA  amLODipine (NORVASC) 10 MG tablet Take 1 tablet (10 mg total) by mouth daily. 12/10/20  Yes BranchAlphonse Guild, MD  cetirizine (ZYRTEC) 10 MG tablet Take 1 tablet (10 mg total) by mouth daily as needed for allergies. 01/01/20  Yes Corum, Rex Kras, MD  dexamethasone (DECADRON) 2 MG tablet Take 1 tablet (2 mg total) by  mouth 2 (two) times daily. 06/01/21  Yes Derek Jack, MD  ELIQUIS 5 MG TABS tablet TAKE ONE TABLET BY MOUTH 2 TIMES A DAY. 12/10/20  Yes Branch, Alphonse Guild, MD  furosemide (LASIX) 20 MG tablet Take 1 tablet (20 mg total) by mouth every other day. (MAY TAKE DAILY AS NEEDED FOR INCREASED SWELLING) 04/21/21  Yes Branch, Alphonse Guild, MD  hydrALAZINE (APRESOLINE) 100 MG tablet TAKE (1) TABLET BY MOUTH (3) TIMES DAILY. Patient taking differently: Take 100 mg by mouth 2 (two) times daily. 05/27/21  Yes BranchAlphonse Guild, MD  isosorbide mononitrate (IMDUR) 30 MG 24 hr tablet TAKE 1/2 TABLET BY MOUTH ONCE DAILY. 11/10/20  Yes Branch, Alphonse Guild, MD  metoprolol  succinate (TOPROL XL) 25 MG 24 hr tablet Take 1 tablet (25 mg total) by mouth daily. 01/12/21  Yes BranchAlphonse Guild, MD  omeprazole (PRILOSEC) 20 MG capsule Take 20 mg by mouth daily as needed (reflux). 01/01/20  Yes [provider]  potassium chloride SA (KLOR-CON) 20 MEQ tablet Take 1 tablet (20 mEq total) by mouth daily. 08/13/20  Yes Imogene Burn, PA-C  spironolactone (ALDACTONE) 25 MG tablet TAKE 1 TABLET BY MOUTH ONCE DAILY. 01/27/21  Yes Branch, Alphonse Guild, MD  VENTOLIN HFA 108 [phone] Base) MCG/ACT inhaler Inhale 2 puffs into the lungs every 6 (six) hours as needed for wheezing or shortness of breath. 07/11/20  Yes Fayrene Helper, MD  meclizine (ANTIVERT) 25 MG tablet Take 1 tablet (25 mg total) by mouth every 6 (six) hours as needed for dizziness. Patient not taking: Reported on 06/01/2021 04/14/21   Arnoldo Lenis, MD    Allergies    Augmentin [amoxicillin-pot clavulanate] and Flomax [tamsulosin hcl]  Review of Systems   Review of Systems  Constitutional:  Positive for activity change and unexpected weight change.  Respiratory:  Negative for cough and shortness of breath.   Cardiovascular:  Negative for chest pain.  Gastrointestinal:  Negative for diarrhea, nausea and vomiting.  Genitourinary:  Negative for difficulty  urinating.  Neurological:  Negative for dizziness.  All other systems reviewed and are negative.  Physical Exam Updated Vital Signs BP 135/73   Pulse (!) 51   Temp 98.2 F (36.8 C) (Oral)   Resp 18   Ht 6' (1.829 m)   Wt 76.5 kg   SpO2 95%   BMI 22.87 kg/m   Physical Exam Vitals and nursing note reviewed.  Constitutional:      Appearance: He is well-developed.  HENT:     Head: Atraumatic.  Eyes:     Extraocular Movements: Extraocular movements intact.     Pupils: Pupils are equal, round, and reactive to light.  Cardiovascular:     Rate and Rhythm: Normal rate.  Pulmonary:     Effort: Pulmonary effort is normal.  Abdominal:     Tenderness: There is no abdominal tenderness.  Musculoskeletal:        General: No swelling or tenderness.     Cervical back: Neck supple.  Skin:    General: Skin is warm.  Neurological:     Mental Status: He is alert and oriented to person, place, and time.    ED Results / Procedures / Treatments   Labs (all labs ordered are listed, but only abnormal results are displayed) Labs Reviewed  CBC WITH DIFFERENTIAL/PLATELET - Abnormal; Notable for the following components:      Result Value   WBC 25.3 (*)    Neutro Abs 23.1 (*)    Lymphs Abs 0.4 (*)    Monocytes Absolute 1.1 (*)    Abs Immature Granulocytes 0.59 (*)    All other components within normal limits  COMPREHENSIVE METABOLIC PANEL - Abnormal; Notable for the following components:   Sodium 124 (*)    Potassium 6.6 (*)    CO2 12 (*)    Glucose, Bld 395 (*)    BUN 72 (*)    Creatinine, Ser 2.15 (*)    Calcium 8.5 (*)    Albumin 3.3 (*)    GFR, Estimated 31 (*)    All other components within normal limits  URINALYSIS, ROUTINE W REFLEX MICROSCOPIC - Abnormal; Notable for the following components:   Glucose, UA >=500 (*)  Bacteria, UA RARE (*)    All other components within normal limits  CBG MONITORING, ED - Abnormal; Notable for the following components:    Glucose-Capillary 350 (*)    All other components within normal limits  RESP PANEL BY RT-PCR (FLU A&B, COVID) ARPGX2  LACTATE DEHYDROGENASE  URIC ACID  PHOSPHORUS  GLUCOSE, RANDOM    EKG EKG Interpretation  Date/Time:  Monday June 01 2021 18:42:32 EDT Ventricular Rate:  57 PR Interval:  169 QRS Duration: 116 QT Interval:  417 QTC Calculation: 406 R Axis:   0 Text Interpretation: Sinus rhythm LVH with secondary repolarization abnormality No acute changes No significant change since last tracing Confirmed by Varney Biles ([id number]) on 06/01/2021 9:02:21 PM  Radiology DG Chest Port 1 View  Result Date: 06/01/2021 CLINICAL DATA:  Elevated potassium, leg weakness EXAM: PORTABLE CHEST 1 VIEW COMPARISON:  08/01/2020 FINDINGS: Lungs are clear.  No pleural effusion or pneumothorax. Heart is top-normal in size.  Prominent epicardial fat. IMPRESSION: No evidence of acute cardiopulmonary disease. Electronically Signed   By: Julian Hy M.D.   On: 06/01/2021 21:06    Procedures .Critical Care Performed by: Varney Biles, MD Authorized by: Varney Biles, MD   Critical care provider statement:    Critical care time (minutes):  62   Critical care was necessary to treat or prevent imminent or life-threatening deterioration of the following conditions:  Renal failure and metabolic crisis   Critical care was time spent personally by me on the following activities:  Discussions with consultants, evaluation of patient's response to treatment, examination of patient, ordering and performing treatments and interventions, ordering and review of laboratory studies, ordering and review of radiographic studies, pulse oximetry, re-evaluation of patient's condition, obtaining history from patient or surrogate and review of old charts   Medications Ordered in ED Medications  sodium bicarbonate 150 mEq in sterile water 1,150 mL infusion (has no administration in time range)  calcium gluconate 1  g/ 50 mL sodium chloride IVPB (0 mg Intravenous Stopped 06/01/21 2106)  sodium bicarbonate injection 50 mEq (50 mEq Intravenous Given 06/01/21 2024)  insulin aspart (novoLOG) injection 5 Units (5 Units Intravenous Given 06/01/21 2023)  sodium zirconium cyclosilicate (LOKELMA) packet 10 g (10 g Oral Given 06/01/21 2025)  sodium chloride 0.9 % bolus 1,000 mL (1,000 mLs Intravenous New Bag/Given 06/01/21 2025)    ED Course  I have reviewed the triage vital signs and the nursing notes.  Pertinent labs & imaging results that were available during my care of the patient were reviewed by me and considered in my medical decision making (see chart for details).  Clinical Course as of 06/01/21 2110  Mon Jun 01, 2021  2038 Potassium(!!): 6.6 EKG has no acute changes.  Given that the K is over 6.5, he is going to receive insulin, bicarb, and Lokelma and calcium gluconate. [AN]  2038 CO2(!): 12 Bicarb is now 12.  Critically low.  I have discussed the case with Dr. Posey Pronto, nephrology.  He recommends that we start patient on bicarb drip, the specifics of which were provided by him.  Dr. Posey Pronto also recommends repeat labs in the morning, which if not improving, medicine can reconsult renal service. [AN]  2052 WBC(!): 25.3 Unclear etiology.  Patient is reporting no fevers, chills, URI-like symptoms, cough, abdominal pain, vomiting, diarrhea, UTI-like symptoms.  Could be sequelae of lymphoma itself.  We will not start antibiotics. [AN]  2052 Glucose(!): 395 Unsure why the sugar is elevated.  Atypical for someone  at this age to develop diabetes.  Again, could be part of progression of lymphoma.  Patient indicates that about 2 or 3 weeks ago he was started on steroids by Dr. Raliegh Ip, Oncology.  He is not certain why and does not know what dose.  Patient states that after he started receiving steroids, he has noticed increased urination every 2 hours.  So, it is unclear if patient has hyperglycemia triggered by steroids, some  kind of viral illness or worsening of the cancer. [AN]    Clinical Course User Index [AN] Varney Biles, MD   MDM Rules/Calculators/A&P                           75 year old male with history of lymphoma, A. fib, CHF, CKD comes in with chief complaint of abnormal labs.  Patient has been feeling generally well and was sent for preop labs earlier today, results of which indicated hyperglycemia, hyperkalemia, hyponatremia, and elevated white count.  He was advised to come to the ER.  Dr. Arnoldo Morale, general surgery had alerted me about the patient coming in.  He reports that they will assess the patient after he is admitted to see if they can proceed with biopsy on Wednesday or cancel the procedure altogether.  In the ER, history suggestive of polyuria, weight loss in the last 2 weeks.  Patient's blood sugar is elevated but there is no history of diabetes.  He does indicate that he was started on steroids prior to the polyuria.  Unclear why the sugar is elevated.  In his case, this could be triggered by the steroids or new onset diabetes secondary to cancer progression.   Additionally, patient has significant rise in BUN without any insensible fluid loss.  Given that there is lymphoma, there is possibility of tumor lysis syndrome.  We will add LDH, uric acid, phosphorus to his lab work-up.  His bicarb is only 12.  No anion gap with it.  I discussed the case with Dr. Posey Pronto, nephrology.  He indicates starting patient on bicarb drip.  Finally, patient has leukocytosis without any clinical concerns for infection.  X-rays and urine analysis ordered.  No antibiotics initiated.  Stable for admission at this time. Hyperkalemia treated with appropriate medications.  No EKG changes.   Final Clinical Impression(s) / ED Diagnoses Final diagnoses:  Acute renal failure, unspecified acute renal failure type ([place])  Acute hyperkalemia    Rx / DC Orders ED Discharge Orders     None        Varney Biles, MD 06/01/21 2113

## 2021-06-01 NOTE — Pre-Procedure Instructions (Signed)
I received a phone call from Dr Arnoldo Morale who was trying to reach patient and have him report to the ED due to abnormal labs. He left a voicemail on the mobile phone voicemail but could not contact patient. I left a message on both the mobile and home phone numbers instructing patient to report to the ED per Dr Arnoldo Morale request ASAP.

## 2021-06-01 NOTE — H&P (Signed)
History and Physical    Darius Smith:[id number] DOB: 04-22-1946 DOA: 06/01/2021  PCP: [name], MD  Patient coming from: Home  I have personally briefly reviewed patient's old medical records in [place]  Chief Complaint: Abnormal labs  HPI: Darius Smith is a 75 y.o. 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 fib on Eliquis, CHF with reduced EF, hypertension who was recently started on Decadron 2 mg daily for brain metastases and has developed polyuria and polydipsia.  He has been increasingly weak over the past 4 weeks.  He had a first fall this morning.  Apparently he is supposed to undergo axillary lymph node biopsy with Dr. Aviva Signs on Wednesday.  He had preop labs this morning and was noted to have acute on chronic renal insufficiency and elevated blood sugar in the 300 range and potassium of 6.8.  He was instructed to come immediately to the ED. (ED Course: In the ED he was noted to have these labs confirmed as well as a critically low bicarb of 12.  He was given calcium gluconate, Lokelma, insulin and was placed on a bicarb drip.  The EDP consulted with nephrology who suggested running a bicarb drip for at least 20 hours and then rechecking labs in the morning.  If labs continue to be an issue they will consult in the morning.  They do suspect, some kind of tumor lysis as potentiator for these laboratory abnormalities.  Review of Systems: As per HPI otherwise 10 point review of systems negative.   Past Medical History:  Diagnosis Date   Allergic rhinitis 07/02/2019   Arthritis    both hands   Atrial fibrillation ([place]) 10/28/2017   Dr. Harl Bowie   Atrial fibrillation with RVR ([place]) 07/14/2020   Blood in urine    occ   Bronchitis    Cancer ([place])    Basal cell   Cardiomegaly 10/28/2017   CHF (congestive heart failure) ([place])    Complication of anesthesia    Mr. Kishi developed a. fib in recovery after cysto  procedure 10/2017 was transferred to [facility] and underwent successful cardioversion   Diverticulosis of sigmoid colon 08/2017   Noted on colonoscopy   Dysrhythmia    External hemorrhoids 08/2017   GERD (gastroesophageal reflux disease)    History of colon polyps 08/2017   History of kidney stones    History of rectal bleeding    History of right inguinal hernia    Hypertension    Lung cancer ([place]) 06/25/2020   Lymphoma ([place])    Nephrolithiasis 07/02/2019   Rectal bleeding 09/02/2017   Added automatically from request for surgery [id number]   Shortness of breath    Stage 3 chronic kidney disease [facility])     Past Surgical History:  Procedure Laterality Date   CARDIOVERSION     10/2017   CARDIOVERSION N/A 07/16/2020   Procedure: CARDIOVERSION;  Surgeon: Arnoldo Lenis, MD;  Location[facility] ENDO SU[facility];  Service: Endoscopy;  Laterality: N/A;   CARDIOVERSION N/A 08/21/2020   Procedure: CARDIOVERSION;  Surgeon: Satira Sark, MD;  Location: [facility] ORS;  Service: Cardiovascular;  Laterality: N/A;   CARDIOVERSION N/A 09/10/2020   Procedure: CARDIOVERSION;  Surgeon: Josue Hector, MD;  Location: [facility] ENDOSCOPY;  Service: Cardiovascular;  Laterality: N/A;   CHOLECYSTECTOMY     CLEFT LIP REPAIR     several from childhood til 75 years old   [place]  several from childhood til 75 years old   COLONOSCOPY  04/28/2011   Procedure: COLONOSCOPY;  Surgeon: Rogene Houston, MD;  Location: [facility] ENDO [facility];  Service: Endoscopy;  Laterality: N/A;   COLONOSCOPY N/A 07/18/2014   Procedure: COLONOSCOPY;  Surgeon: Rogene Houston, MD;  Location: [facility];  Service: Endoscopy;  Laterality: N/A;  830   COLONOSCOPY N/A 09/09/2017   Procedure: COLONOSCOPY;  Surgeon: Rogene Houston, MD;  Location: [facility] END[facility];  Service: Endoscopy;  Laterality: N/A;  955   CYSTOSCOPY/URETEROSCOPY/HOLMIUM LASER/STENT PLACEMENT Bilateral 12/06/2017   Procedure: CYSTOSCOPY/RETROGRADE/URETEROSCOPY/HOLMIUM LASER/STENT  EXCHANGE;  Surgeon: Festus Aloe, MD;  Location: [facility] ORS;  Service: Urology;  Laterality: Bilateral;  ONLY NEEDS 60 MIN   HERNIA REPAIR     POLYPECTOMY  09/09/2017   Procedure: POLYPECTOMY;  Surgeon: Rogene Houston, MD;  Location: [facility];  Service: Endoscopy;;  colon   vocal cord surgery       reports that he has never smoked. He has never used smokeless tobacco. He reports that he does not currently use alcohol after a past usage of about 7.0 standard drinks per week. He reports that he does not use drugs.  Patient is a retired Furniture conservator/restorer he has a son who lives locally and a daughter in [place].  He lives with his wife of 57 years presently.   Allergies  Allergen Reactions   Augmentin [Amoxicillin-Pot Clavulanate] Other (See Comments)    Increased liver enzymes Has patient had a PCN reaction causing immediate rash, facial/tongue/throat swelling, SOB or lightheadedness with hypotension: No Has patient had a PCN reaction causing severe rash involving mucus membranes or skin necrosis: No Has patient had a PCN reaction that required hospitalization: No Has patient had a PCN reaction occurring within the last 10 years: No  If all of the above answers are "NO", then may proceed with Cephalosporin use.    Flomax [Tamsulosin Hcl] Nausea Only    Dizziness  Pt is on flomax    Family History  Problem Relation Age of Onset   Breast cancer Mother    Prostate cancer Father    COPD Sister     Prior to Admission medications   Medication Sig Start Date End Date Taking? Authorizing Provider  acetaminophen (TYLENOL) 500 MG tablet Take 1,000 mg by mouth every 6 (six) hours as needed for moderate pain or headache.   Yes [provider]  amiodarone (PACERONE) 200 MG tablet Take 1 tablet (200 mg total) by mouth daily. 02/23/21  Yes Fenton, Clint R, PA  amLODipine (NORVASC) 10 MG tablet Take 1 tablet (10 mg total) by mouth daily. 12/10/20  Yes BranchAlphonse Guild, MD  cetirizine  (ZYRTEC) 10 MG tablet Take 1 tablet (10 mg total) by mouth daily as needed for allergies. 01/01/20  Yes Corum, Rex Kras, MD  dexamethasone (DECADRON) 2 MG tablet Take 1 tablet (2 mg total) by mouth 2 (two) times daily. 06/01/21  Yes Derek Jack, MD  ELIQUIS 5 MG TABS tablet TAKE ONE TABLET BY MOUTH 2 TIMES A DAY. 12/10/20  Yes Branch, Alphonse Guild, MD  furosemide (LASIX) 20 MG tablet Take 1 tablet (20 mg total) by mouth every other day. (MAY TAKE DAILY AS NEEDED FOR INCREASED SWELLING) 04/21/21  Yes Branch, Alphonse Guild, MD  hydrALAZINE (APRESOLINE) 100 MG tablet TAKE (1) TABLET BY MOUTH (3) TIMES DAILY. Patient taking differently: Take 100 mg by mouth 2 (two) times daily. 05/27/21  Yes Branch, Alphonse Guild, MD  isosorbide mononitrate (  IMDUR) 30 MG 24 hr tablet TAKE 1/2 TABLET BY MOUTH ONCE DAILY. 11/10/20  Yes BranchAlphonse Guild, MD  metoprolol succinate (TOPROL XL) 25 MG 24 hr tablet Take 1 tablet (25 mg total) by mouth daily. 01/12/21  Yes BranchAlphonse Guild, MD  omeprazole (PRILOSEC) 20 MG capsule Take 20 mg by mouth daily as needed (reflux). 01/01/20  Yes [provider]  potassium chloride SA (KLOR-CON) 20 MEQ tablet Take 1 tablet (20 mEq total) by mouth daily. 08/13/20  Yes Imogene Burn, PA-C  spironolactone (ALDACTONE) 25 MG tablet TAKE 1 TABLET BY MOUTH ONCE DAILY. 01/27/21  Yes Branch, Alphonse Guild, MD  VENTOLIN HFA 108 [id number] Base) MCG/ACT inhaler Inhale 2 puffs into the lungs every 6 (six) hours as needed for wheezing or shortness of breath. 07/11/20  Yes Fayrene Helper, MD  meclizine (ANTIVERT) 25 MG tablet Take 1 tablet (25 mg total) by mouth every 6 (six) hours as needed for dizziness. Patient not taking: Reported on 06/01/2021 04/14/21   Arnoldo Lenis, MD    Physical Exam: Vitals:   06/01/21 1841 06/01/21 1900 06/01/21 1930 06/01/21 2000  BP: (!) 149/101 139/71 136/72 135/73  Pulse: (!) 57 (!) 51 (!) 48 (!) 51  Resp: (!) 21 16 16 18   Temp: 98.2 F (36.8 C)     TempSrc:  Oral     SpO2: 99% 100% 99% 95%  Weight:      Height:        Constitutional: NAD, calm, comfortable Eyes: PERRL, lids and conjunctivae normal ENMT: Mucous membranes are moist. Posterior pharynx clear of any exudate or lesions.Normal dentition.  Neck: normal, supple, no masses, no thyromegaly Respiratory: clear to auscultation bilaterally, no wheezing, no crackles. Normal respiratory effort. No accessory muscle use.  Cardiovascular: Regular rate and rhythm, no murmurs / rubs / gallops. No extremity edema. 2+ pedal pulses. No carotid bruits.  Abdomen: no tenderness, no masses palpated. No hepatosplenomegaly. Bowel sounds positive.  Musculoskeletal: no clubbing / cyanosis. No joint deformity upper and lower extremities. Good ROM, no contractures. Normal muscle tone.  Skin: no rashes, lesions, ulcers. No induration Neurologic: Grossly normal Psychiatric: Normal judgment and insight. Alert and oriented x 3. Normal mood.   Labs on Admission: I have personally reviewed following labs and imaging studies  CBC: Recent Labs  Lab 06/01/21 1503 06/01/21 1914  WBC 24.6* 25.3*  NEUTROABS 22.5* 23.1*  HGB 15.5 15.2  HCT 46.0 45.5  MCV 92.6 93.0  PLT 199 935   Basic Metabolic Panel: Recent Labs  Lab 06/01/21 1503 06/01/21 1914 06/01/21 1929  NA 128* 124*  --   K 6.8* 6.6*  --   CL 104 103  --   CO2 15* 12*  --   GLUCOSE 434* 395*  --   BUN 70* 72*  --   CREATININE 2.11* 2.15*  --   CALCIUM 8.7* 8.5*  --   PHOS  --   --  4.3   GFR: Estimated Creatinine Clearance: 32.1 mL/min (A) (by C-G formula based on SCr of 2.15 mg/dL (H)). Liver Function Tests: Recent Labs  Lab 06/01/21 1914  AST 22  ALT 33  ALKPHOS 60  BILITOT 0.8  PROT 6.8  ALBUMIN 3.3*    CBG: Recent Labs  Lab 06/01/21 1926  GLUCAP 350*    Urine analysis:    Component Value Date/Time   COLORURINE YELLOW 06/01/2021 1934   APPEARANCEUR CLEAR 06/01/2021 1934   APPEARANCEUR Clear 06/10/2020 0851   LABSPEC  1.015 06/01/2021 1934   PHURINE 5.0 06/01/2021 1934   GLUCOSEU >=500 (A) 06/01/2021 1934   HGBUR NEGATIVE 06/01/2021 1934   BILIRUBINUR NEGATIVE 06/01/2021 1934   BILIRUBINUR Negative 06/10/2020 [place] 06/01/2021 1934   PROTEINUR NEGATIVE 06/01/2021 1934   UROBILINOGEN 2.0 (A) 04/09/2020 1050   NITRITE NEGATIVE 06/01/2021 1934   LEUKOCYTESUR NEGATIVE 06/01/2021 1934    Radiological Exams on Admission: DG Chest Port 1 View  Result Date: 06/01/2021 CLINICAL DATA:  Elevated potassium, leg weakness EXAM: PORTABLE CHEST 1 VIEW COMPARISON:  08/01/2020 FINDINGS: Lungs are clear.  No pleural effusion or pneumothorax. Heart is top-normal in size.  Prominent epicardial fat. IMPRESSION: No evidence of acute cardiopulmonary disease. Electronically Signed   By: Julian Hy M.D.   On: 06/01/2021 21:06    EKG: Independently reviewed.  Sinus rhythm, LVH with repolarization abnormality unchanged from previous exam  Assessment/Plan Active Problems:   Essential hypertension   GERD (gastroesophageal reflux disease)   Atrial fibrillation (HCC)   Follicular lymphoma (HCC)   Chronic systolic HF (heart failure) (HCC)   Acute on chronic renal failure (HCC)   Hyperkalemia  Hyperkalemia Lokelma Calcium gluconate Insulin Repeat labs in a.m.  Profound acidemia Bicarbonate drip Repeat labs in a.m.  Stage IV lymphoma Patient is in palliative care Has brain metastases DNR  New onset hyperglycemia Likely secondary to Decadron use We will give basal and meal correction  Sliding scale insulin  Essential hypertension Continue Norvasc, hydralazine, spironolactone  Atrial fibrillation Continue amiodarone In sinus Holding his Eliquis for possible surgery on Wednesday.  Acute on chronic renal insufficiency Gentle IV hydration BUN is 72 Baseline creatinine is 1.8 up to 2.15 today Avoid nephrotoxic agents  HFrEF Continue Lasix No evidence of fluid overload at this  time Imdur Spironolactone  GERD Continue PPI  DVT prophylaxis: SCD/Compression stockings Code Status: DNR confirmed with patient and his wife Family Communication: Wife at bedside Disposition Plan: Home Consults called: Nephrology by EDP no formal consult done unless labs are not recovering Admission status: Observation Patient is in observation status due to need for telemetry, IV fluid resuscitation, bicarbonate IV drip, continued work-up.   Donnamae Jude MD [facility]  If 7PM-7AM, please contact night-coverage 06/01/2021, 9:42 PM

## 2021-06-01 NOTE — Telephone Encounter (Signed)
Refill for Dexamethasone sent to [facility] per Dr. Delton Coombes

## 2021-06-02 ENCOUNTER — Observation Stay (HOSPITAL_COMMUNITY): Payer: Medicare Other

## 2021-06-02 ENCOUNTER — Encounter (HOSPITAL_COMMUNITY): Payer: Self-pay | Admitting: Anesthesiology

## 2021-06-02 DIAGNOSIS — N171 Acute kidney failure with acute cortical necrosis: Secondary | ICD-10-CM | POA: Diagnosis not present

## 2021-06-02 DIAGNOSIS — N189 Chronic kidney disease, unspecified: Secondary | ICD-10-CM | POA: Diagnosis not present

## 2021-06-02 DIAGNOSIS — N1832 Chronic kidney disease, stage 3b: Secondary | ICD-10-CM | POA: Diagnosis not present

## 2021-06-02 LAB — COMPREHENSIVE METABOLIC PANEL
ALT: 28 U/L (ref 0–44)
AST: 19 U/L (ref 15–41)
Albumin: 2.7 g/dL — ABNORMAL LOW (ref 3.5–5.0)
Alkaline Phosphatase: 40 U/L (ref 38–126)
Anion gap: 8 (ref 5–15)
BUN: 54 mg/dL — ABNORMAL HIGH (ref 8–23)
CO2: 22 mmol/L (ref 22–32)
Calcium: 7.8 mg/dL — ABNORMAL LOW (ref 8.9–10.3)
Chloride: 100 mmol/L (ref 98–111)
Creatinine, Ser: 1.54 mg/dL — ABNORMAL HIGH (ref 0.61–1.24)
GFR, Estimated: 47 mL/min — ABNORMAL LOW (ref >60)
Glucose, Bld: 147 mg/dL — ABNORMAL HIGH (ref 70–99)
Potassium: 5.6 mmol/L — ABNORMAL HIGH (ref 3.5–5.1)
Sodium: 130 mmol/L — ABNORMAL LOW (ref 135–145)
Total Bilirubin: 1.2 mg/dL (ref 0.3–1.2)
Total Protein: 5.5 g/dL — ABNORMAL LOW (ref 6.5–8.1)

## 2021-06-02 LAB — CBC
HCT: 39.7 % (ref 39.0–52.0)
Hemoglobin: 13.4 g/dL (ref 13.0–17.0)
MCH: 30.6 pg (ref 26.0–34.0)
MCHC: 33.8 g/dL (ref 30.0–36.0)
MCV: 90.6 fL (ref 80.0–100.0)
Platelets: 162 10*3/uL (ref 150–400)
RBC: 4.38 MIL/uL (ref 4.22–5.81)
RDW: 12.1 % (ref 11.5–15.5)
WBC: 21.1 10*3/uL — ABNORMAL HIGH (ref 4.0–10.5)
nRBC: 0 % (ref 0.0–0.2)

## 2021-06-02 LAB — HEMOGLOBIN A1C
Hgb A1c MFr Bld: 6.5 % — ABNORMAL HIGH (ref 4.8–5.6)
Mean Plasma Glucose: 139.85 mg/dL

## 2021-06-02 LAB — CBG MONITORING, ED: Glucose-Capillary: 122 mg/dL — ABNORMAL HIGH (ref 70–99)

## 2021-06-02 MED ORDER — INSULIN STARTER KIT- PEN NEEDLES (ENGLISH)
1.0000 | Freq: Once | Status: AC
  Administered 2021-06-02: 1
  Filled 2021-06-02: qty 1

## 2021-06-02 MED ORDER — LOKELMA 5 G PO PACK: 5.0000 g | PACK | Freq: Every day | ORAL | 0 refills | Status: DC

## 2021-06-02 MED ORDER — INSULIN PEN NEEDLE 29G X 12MM MISC: 1.0000 | Freq: Three times a day (TID) | 0 refills | Status: DC

## 2021-06-02 MED ORDER — INSULIN LISPRO (1 UNIT DIAL) 100 UNIT/ML (KWIKPEN): 1.0000 [IU] | PEN_INJECTOR | Freq: Three times a day (TID) | SUBCUTANEOUS | 11 refills | Status: DC

## 2021-06-02 MED ORDER — BLOOD GLUCOSE MONITOR KIT: 1.0000 | PACK | Freq: Three times a day (TID) | 0 refills | Status: DC

## 2021-06-02 NOTE — Progress Notes (Addendum)
Inpatient Diabetes Program Recommendations  AACE/ADA: New Consensus Statement on Inpatient Glycemic Control (2015)  Target Ranges:  Prepandial:   less than 140 mg/dL      Peak postprandial:   less than 180 mg/dL (1-2 hours)      Critically ill patients:  140 - 180 mg/dL   Lab Results  Component Value Date   GLUCAP 135 (H) 06/01/2021    Review of Glycemic Control Results for Darius Smith, Darius Smith (MRN [id number]) as of 06/02/2021 08:36  Ref. Range 06/01/2021 19:26 06/01/2021 22:29  Glucose-Capillary Latest Ref Range: 70 - 99 mg/dL 350 (H) 135 (H)   Diabetes history: no hx of DM Outpatient Diabetes medications: none Current orders for Inpatient glycemic control: Levemir 10 units QHS, Novolog 0-9 units TID, Novolog 3 units TID Decadron 2 mg BID  Inpatient Diabetes Program Recommendations:    Patient was recently started on Decadron outpatient. Question if patient/family may need insulin pen teaching given patient was symptomatic on arrival and glucose serum >400 mg/dL. Insulin pen starter kit ordered. RN to begin working with patient on injections in the event insulin is needed at discharge. [facility] consult placed.  Will reach out to patient today.   Addendum: Attempted to reach out to patient regarding insulin injections. Unable to reach patient. RN messaged to assist. Spoke with RN and wife regarding insulin injections.  Reviewed patient's current A1c of 6.5%. Explained what a A1c is and what it measures. Also reviewed goal A1c with patient, importance of good glucose control @ home, and blood sugar goals. Explained survival skills, interventions, signs and symptoms of hypo vs hyperglycemia, impact of steroids to glucose control, vascular changes and commorbidities.  Encouraged patient's wife to make PCP follow up for blood sugars.  Blood glucose meter (#[id number]). Reviewed frequency and when to call MD.   Educated patient and spouse on insulin pen use at home. Reviewed contents of insulin flexpen  starter kit. Reviewed all steps if insulin pen including attachment of needle, 2-unit air shot, dialing up dose, giving injection, removing needle, disposal of sharps, storage of unused insulin, disposal of insulin etc. Patient able to provide successful return demonstration. Also reviewed troubleshooting with insulin pen. MD to give patient Rxs for insulin pens and insulin pen needles.   Thanks, Bronson Curb, MSN, RNC-OB Diabetes Coordinator [phone] (8a-5p)

## 2021-06-02 NOTE — Pre-Procedure Instructions (Signed)
Spoke with Dr Arnoldo Morale and he wants to keep patient on schedule and do Istat on arrival tomorrow. I called Mrs [place] to go over arrival time and to make sure she was aware that we will proceed with surgery tomorrow and she voices understanding of all information.

## 2021-06-02 NOTE — Discharge Instructions (Signed)
HumaLOG KwikPen Inject 1-10 Units into the skin 4 (four) times daily -  before meals and at bedtime.  Blood sugars <70 : 0 units  70-120: 1 unit  121-150: 2 units  151-200: 3 units  201-250: 4 units  251-300: 5 units  301-350: 6 units  351-400: 9 units  More than 400, 10 units and call your doctor

## 2021-06-02 NOTE — Discharge Summary (Signed)
Physician Discharge Summary  Darius Smith SWH:[id number] DOB: 03-11-1946 DOA: 06/01/2021  PCP: [name], MD  Admit date: 06/01/2021 Discharge date: 06/02/2021  Admitted From: Home Disposition: Home  Recommendations for Outpatient Follow-up:  Follow-up with surgery office as a scheduled for biopsy tomorrow.  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  He does have polyuria and polydipsia after starting steroids but no other acute symptoms. In the emergency room, treated with IV fluids, calcium gluconate, Lokelma and insulin and also treated with bicarbonate with clinical improvement.  Patient currently able to hydrate by mouth.  Potassium 5.6.  Creatinine is 1.54.  Electrolytes are adequate.  With adequate clinical improvement and already scheduled outpatient surgery, he is going home today.  Plan: - Acute renal failure with hyperkalemia.  Discontinue potassium supplements.  Discontinue spironolactone.  Lokelma 5 mg daily to continue.  Adequate oral intake.  Continue Lasix.  Recheck tomorrow before surgery.  -Type 2 diabetes, steroid-induced hyperglycemia.  Hemoglobin A1c 6.5.  Expected to stay on different doses of steroids moving forward with brain mets.  Difficulty to manage with oral hypoglycemics due to unreliable oral intake.  Discussed with patient and family about managing his diabetes with sliding scale insulin at this time until his intake he stabilizes and his steroid doses are stabilized.  Insulin education provided.  Blood  sugars monitoring, hypoglycemic symptoms monitoring at home.  Discharged on sliding scale insulin.  Depends upon more need for the steroid doses, his requirements will change.  -Chronic A. fib.  Rate controlled.  On amiodarone that he will continue.  Patient will continue to hold Eliquis until scheduled surgery tomorrow.  -Stage IV lymphoma with brain mets: Followed by oncology.  He will have outpatient follow-up.  Discussed case with surgery and oncology.  Patient can go home today and follow-up tomorrow as scheduled previously.  Stable for discharge.    Discharge Diagnoses:  Active Problems:   Essential hypertension   GERD (gastroesophageal reflux disease)   Atrial fibrillation (HCC)   Follicular lymphoma (HCC)   Chronic systolic HF (heart failure) (HCC)   Acute on chronic renal failure (HCC)   Hyperkalemia    Discharge Instructions  Discharge Instructions     Call MD for:  difficulty breathing, headache or visual disturbances   Complete by: As directed    Call MD for:  extreme fatigue   Complete by: As directed    Call MD for:  persistant nausea and vomiting   Complete by: As directed    Diet Carb Modified   Complete by: As directed    Discharge instructions   Complete by: As directed    There are some changes on your medications.  Follow new instructions. Report to surgical procedure as previously scheduled. Continue to hold Eliquis until surgery. Make yourself familiar with the insulin uses and use as instructed.   Increase activity slowly   Complete by: As directed       Allergies as of 06/02/2021       Reactions   Augmentin [amoxicillin-pot Clavulanate] Other (See Comments)   Increased liver enzymes Has patient  had a PCN reaction causing immediate rash, facial/tongue/throat swelling, SOB or lightheadedness with hypotension: No Has patient had a PCN reaction causing severe rash involving mucus membranes or skin necrosis: No Has patient had a PCN reaction that  required hospitalization: No Has patient had a PCN reaction occurring within the last 10 years: No  If all of the above answers are "NO", then may proceed with Cephalosporin use.   Flomax [tamsulosin Hcl] Nausea Only   Dizziness  Pt is on flomax        Medication List     STOP taking these medications    meclizine 25 MG tablet Commonly known as: ANTIVERT   potassium chloride SA 20 MEQ tablet Commonly known as: KLOR-CON   spironolactone 25 MG tablet Commonly known as: ALDACTONE       TAKE these medications    acetaminophen 500 MG tablet Commonly known as: TYLENOL Take 1,000 mg by mouth every 6 (six) hours as needed for moderate pain or headache.   amiodarone 200 MG tablet Commonly known as: PACERONE Take 1 tablet (200 mg total) by mouth daily.   amLODipine 10 MG tablet Commonly known as: NORVASC Take 1 tablet (10 mg total) by mouth daily.   blood glucose meter kit and supplies Kit Inject 1 each into the skin 4 (four) times daily -  before meals and at bedtime. Dispense based on patient and insurance preference. Use up to four times daily as directed.   cetirizine 10 MG tablet Commonly known as: ZYRTEC Take 1 tablet (10 mg total) by mouth daily as needed for allergies.   dexamethasone 2 MG tablet Commonly known as: DECADRON Take 1 tablet (2 mg total) by mouth 2 (two) times daily.   Eliquis 5 MG Tabs tablet Generic drug: apixaban TAKE ONE TABLET BY MOUTH 2 TIMES A DAY.   furosemide 20 MG tablet Commonly known as: LASIX Take 1 tablet (20 mg total) by mouth every other day. (MAY TAKE DAILY AS NEEDED FOR INCREASED SWELLING)   hydrALAZINE 100 MG tablet Commonly known as: APRESOLINE TAKE (1) TABLET BY MOUTH (3) TIMES DAILY. What changed: See the new instructions.   insulin lispro 100 UNIT/ML KwikPen Commonly known as: HumaLOG KwikPen Inject 1-10 Units into the skin 4 (four) times daily -  before meals and at bedtime. Blood sugars 70-120: 1 unit 121-150: 2  units 151-200: 3 units 201-250: 4 units 251-300: 5 units 301-350: 6 units 351-400: 9 units More than 400, 10 units and call your doctor   Insulin Pen Needle 29G X 12MM Misc 1 each by Does not apply route 4 (four) times daily -  before meals and at bedtime.   isosorbide mononitrate 30 MG 24 hr tablet Commonly known as: IMDUR TAKE 1/2 TABLET BY MOUTH ONCE DAILY.   Lokelma 5 g packet Generic drug: sodium zirconium cyclosilicate Take 5 g by mouth daily.   metoprolol succinate 25 MG 24 hr tablet Commonly known as: Toprol XL Take 1 tablet (25 mg total) by mouth daily.   omeprazole 20 MG capsule Commonly known as: PRILOSEC Take 20 mg by mouth daily as needed (reflux).   Ventolin HFA 108 (90 Base) MCG/ACT inhaler Generic drug: albuterol Inhale 2 puffs into the lungs every 6 (six) hours as needed for wheezing or shortness of breath.        Allergies  Allergen Reactions   Augmentin [Amoxicillin-Pot Clavulanate] Other (See Comments)    Increased liver enzymes Has patient had a PCN reaction causing immediate rash, facial/tongue/throat  swelling, SOB or lightheadedness with hypotension: No Has patient had a PCN reaction causing severe rash involving mucus membranes or skin necrosis: No Has patient had a PCN reaction that required hospitalization: No Has patient had a PCN reaction occurring within the last 10 years: No  If all of the above answers are "NO", then may proceed with Cephalosporin use.    Flomax [Tamsulosin Hcl] Nausea Only    Dizziness  Pt is on flomax    Consultations: Oncology, surgery.  Curbside consult.   Procedures/Studies: MR Brain W Wo Contrast  Addendum Date: 05/10/2021   ADDENDUM REPORT: 05/10/2021 20:03 ADDENDUM: Not included in the impression, there is right posterior auricular soft tissue likely reflecting adenopathy. Electronically Signed   By: Macy Mis M.D.   On: 05/10/2021 20:03   Result Date: 05/10/2021 CLINICAL DATA:  Hematologic malignancy,  surveillance EXAM: MRI HEAD WITHOUT AND WITH CONTRAST TECHNIQUE: Multiplanar, multiecho pulse sequences of the brain and surrounding structures were obtained without and with intravenous contrast. CONTRAST:  8.10mL GADAVIST GADOBUTROL 1 MMOL/ML IV SOLN COMPARISON:  None. FINDINGS: Brain: Several (approximately 5) enhancing lesions are identified likely involving brain parenchyma. Larger posterior fossa lesion measures 1.2 cm and is located within the midline cerebellar vermis along the roof of the fourth ventricle. Largest supratentorial lesion involves the anterior right frontal lobe and measures 1.1 cm. There is associated edema without significant mass effect. Minimal diffusion hyperintensity associated with right frontal lesions. No hemorrhage. There is no hydrocephalus or extra-axial fluid collection. Additional patchy foci of T2 hyperintensity in the supratentorial white matter are nonspecific but may reflect chronic microvascular ischemic changes. Vascular: Major vessel flow voids at the skull base are preserved. Skull and upper cervical spine: Normal marrow signal is preserved. Sinuses/Orbits: Paranasal sinus mucosal thickening. Polypoid soft tissue in posterior left nasal cavity extending into the nasopharynx. Orbits are unremarkable. Other: Right posterior auricular soft tissue likely reflects adenopathy. Sella is unremarkable. Mastoid air cells are clear. IMPRESSION: Several small enhancing lesions with edema but no significant mass effect. 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 Likely involving brain parenchyma but given peripheral location, leptomeningeal involvement is not excluded. These results will be called to the ordering clinician or representative by the Radiologist Assistant, and communication documented in the PACS or [employer]. Electronically Signed: By: Macy Mis M.D. On: 05/10/2021 19:25   US Renal  Result Date: 06/02/2021 CLINICAL DATA:  Chronic kidney disease.  EXAM: RENAL / URINARY TRACT ULTRASOUND COMPLETE COMPARISON:  October 02, 2019. FINDINGS: Right Kidney: Renal measurements: 12.4 x 5.8 x 5.8 cm = volume: 220 mL. Mild cortical thinning is noted. Increased echogenicity of renal parenchyma is noted. No mass or hydronephrosis visualized. Left Kidney: Renal measurements: 12.1 x 6.2 x 6.8 cm = volume: 357 mL. 5.3 cm simple cyst is noted. Increased echogenicity of renal parenchyma is noted. No mass or hydronephrosis visualized. Bladder: Appears normal for degree of bladder distention. Ureteral jets are not visualized. Other: None. IMPRESSION: Increased echogenicity of renal parenchyma is noted bilaterally consistent with medical renal disease. No definite hydronephrosis or renal obstruction is noted. Electronically Signed   By: Marijo Conception M.D.   On: 06/02/2021 09:52   NM PET Image Restag (PS) Skull Base To Thigh  Result Date: 05/15/2021 CLINICAL DATA:  Subsequent treatment strategy for lymphoma. EXAM: NUCLEAR MEDICINE PET SKULL BASE TO THIGH TECHNIQUE: 9.23 mCi F-18 FDG was injected intravenously. Full-ring PET imaging was performed from the skull base to thigh after the radiotracer. CT data was  obtained and used for attenuation correction and anatomic localization. Fasting blood glucose: 108 mg/dl COMPARISON:  06/23/2020 FINDINGS: Mediastinal blood pool activity: SUV max 2.48 Liver activity: SUV max 3.32 NECK: Right posterior auricular lesion measures 1.6 cm within SUV max 6.14, image 33/3. (Deauville criteria 4). Formally this measured 1.6 cm max of 11.7. The left posterior scalp lesion has an SUV max of 4.0, image 34/3. (Deauville criteria 4). Formally 10.39. FDG avid right level 2 lymph node measures 7 mm with SUV max of 5.5 (Deauville criteria 5). This is new compared with previous exam. Incidental CT findings: none CHEST: Measures 5.6 x 3.0 cm with SUV max 17.94, image 122/3 (Deauville criteria 4). Formally this measured 4.7 x 4.6 cm within SUV max of 16.8.  No FDG avid supraclavicular, right axillary, mediastinal, or hilar lymph. No FDG avid pulmonary nodule or mass identified. Incidental CT findings: Mild aortic atherosclerosis with coronary artery calcifications. ABDOMEN/PELVIS: There are new small bilateral tracer avid adrenal nodules. On the left this measures 1.2 cm with SUV max of 6.1, image 254/7 on the right this measures 0.9 cm with SUV max of 6.80 (Deauville criteria 5). FDG avid left inguinal lymph node measures 0.8 cm within SUV max of 2.65 (Deauville criteria 3). Formally this measured 0.9 cm with SUV max 8.7. The spleen has a normal size without focal areas of increased uptake. Left kidney cyst is again noted. No abnormal uptake within the liver or pancreas. Incidental CT findings: Cholecystectomy. Left kidney cyst. Aortic atherosclerosis. SKELETON: Interval improvement in multifocal FDG avid bone lesions, including: Index lesion within the right iliac bone has an SUV max of 2.63 (Deauville criteria 3.). Formally 11.59. Large tracer avid lesion involving the T11 vertebra has an SUV max of 1.62 on today's study (Deauville criteria 2). Formally 15.83. Persistent tracer avid tumor is identified within the medial wall of the right acetabulum within SUV max of 9.64 (Deauville criteria 4). This is similar to the previous exam. Incidental CT findings: none IMPRESSION: 1. Imaging findings on today's exam reflect a partial response to therapy. 2. Persistent Deauville criteria 4 disease is identified within the soft tissues of the neck and left axilla, and right acetabulum. New areas of increased uptake are noted within the right cervical nodal chain and bilateral adrenal glands (Deauville criteria 5). Electronically Signed   By: [name] Moors M.D.   On: 05/15/2021 14:22   DG Chest Port 1 View  Result Date: 06/01/2021 CLINICAL DATA:  Elevated potassium, leg weakness EXAM: PORTABLE CHEST 1 VIEW COMPARISON:  08/01/2020 FINDINGS: Lungs are clear.  No pleural  effusion or pneumothorax. Heart is top-normal in size.  Prominent epicardial fat. IMPRESSION: No evidence of acute cardiopulmonary disease. Electronically Signed   By: Julian Hy M.D.   On: 06/01/2021 21:06   (Echo, Carotid, EGD, Colonoscopy, ERCP)    Subjective: Patient seen and examined.  Denies any complaints.  He is comfortable to go home.  He would like to learn to take insulin with his wife.   Discharge Exam: Vitals:   06/02/21 0845 06/02/21 0900  BP: 138/87 127/83  Pulse: 62 61  Resp: (!) 25 16  Temp:    SpO2: 97%    Vitals:   06/02/21 0315 06/02/21 0600 06/02/21 0845 06/02/21 0900  BP: 126/80 128/79 138/87 127/83  Pulse: 63 (!) 57 62 61  Resp: 18 19 (!) 25 16  Temp:      TempSrc:      SpO2: 95% 95% 97%   Weight:  Height:        General: Pt is alert, awake, not in acute distress Fairly comfortable at rest.  In ER stretcher.  On room air. Cardiovascular: RRR, S1/S2 +, no rubs, no gallops Respiratory: CTA bilaterally, no wheezing, no rhonchi Abdominal: Soft, NT, ND, bowel sounds + Extremities: no edema, no cyanosis    The results of significant diagnostics from this hospitalization (including imaging, microbiology, ancillary and laboratory) are listed below for reference.     Microbiology: Recent Results (from the past 240 hour(s))  Resp Panel by RT-PCR (Flu A&B, Covid) Nasopharyngeal Swab     Status: None   Collection Time: 06/01/21  8:34 PM   Specimen: Nasopharyngeal Swab; Nasopharyngeal(NP) swabs in vial transport medium  Result Value Ref Range Status   SARS Coronavirus 2 by RT PCR NEGATIVE NEGATIVE Final    Comment: (NOTE) SARS-CoV-2 target nucleic acids are NOT DETECTED.  The SARS-CoV-2 RNA is generally detectable in upper respiratory specimens during the acute phase of infection. The lowest concentration of SARS-CoV-2 viral copies this assay can detect is 138 copies/mL. A negative result does not preclude SARS-Cov-2 infection and should not  be used as the sole basis for treatment or other patient management decisions. A negative result may occur with  improper specimen collection/handling, submission of specimen other than nasopharyngeal swab, presence of viral mutation(s) within the areas targeted by this assay, and inadequate number of viral copies(<138 copies/mL). A negative result must be combined with clinical observations, patient history, and epidemiological information. The expected result is Negative.  Fact Sheet for Patients:  EntrepreneurPulse.com.au  Fact Sheet for Healthcare Providers:  IncredibleEmployment.be  This test is no t yet approved or cleared by the [place] FDA and  has been authorized for detection and/or diagnosis of SARS-CoV-2 by FDA under an Emergency Use Authorization (EUA). This EUA will remain  in effect (meaning this test can be used) for the duration of the COVID-19 declaration under Section 564(b)(1) of the Act, 21 U.S.C.section 360bbb-3(b)(1), unless the authorization is terminated  or revoked sooner.       Influenza A by PCR NEGATIVE NEGATIVE Final   Influenza B by PCR NEGATIVE NEGATIVE Final    Comment: (NOTE) The Xpert Xpress SARS-CoV-2/FLU/RSV plus assay is intended as an aid in the diagnosis of influenza from Nasopharyngeal swab specimens and should not be used as a sole basis for treatment. Nasal washings and aspirates are unacceptable for Xpert Xpress SARS-CoV-2/FLU/RSV testing.  Fact Sheet for Patients: EntrepreneurPulse.com.au  Fact Sheet for Healthcare Providers: IncredibleEmployment.be  This test is not yet approved or cleared by the [place] FDA and has been authorized for detection and/or diagnosis of SARS-CoV-2 by FDA under an Emergency Use Authorization (EUA). This EUA will remain in effect (meaning this test can be used) for the duration of the COVID-19 declaration under Section  564(b)(1) of the Act, 21 U.S.C. section 360bbb-3(b)(1), unless the authorization is terminated or revoked.  Performed at [facility], [address] [place], [place] [address]      Labs: BNP (last 3 results) Recent Labs    07/14/20 1031 07/17/20 0440 07/31/20 2215  BNP 2,694.0* 1,606.0* 36.6   Basic Metabolic Panel: Recent Labs  Lab 06/01/21 1503 06/01/21 1914 06/01/21 1929 06/01/21 2138 06/02/21 0442  NA 128* 124*  --   --  130*  K 6.8* 6.6*  --   --  5.6*  CL 104 103  --   --  100  CO2 15* 12*  --   --  22  GLUCOSE 434* 395*  --  149* 147*  BUN 70* 72*  --   --  54*  CREATININE 2.11* 2.15*  --   --  1.54*  CALCIUM 8.7* 8.5*  --   --  7.8*  PHOS  --   --  4.3  --   --    Liver Function Tests: Recent Labs  Lab 06/01/21 1914 06/02/21 0442  AST 22 19  ALT 33 28  ALKPHOS 60 40  BILITOT 0.8 1.2  PROT 6.8 5.5*  ALBUMIN 3.3* 2.7*   No results for input(s): LIPASE, AMYLASE in the last 168 hours. No results for input(s): AMMONIA in the last 168 hours. CBC: Recent Labs  Lab 06/01/21 1503 06/01/21 1914 06/02/21 0442  WBC 24.6* 25.3* 21.1*  NEUTROABS 22.5* 23.1*  --   HGB 15.5 15.2 13.4  HCT 46.0 45.5 39.7  MCV 92.6 93.0 90.6  PLT 199 225 162   Cardiac Enzymes: No results for input(s): CKTOTAL, CKMB, CKMBINDEX, TROPONINI in the last 168 hours. BNP: Invalid input(s): POCBNP CBG: Recent Labs  Lab 06/01/21 1926 06/01/21 2229 06/02/21 0807  GLUCAP 350* 135* 122*   D-Dimer No results for input(s): DDIMER in the last 72 hours. Hgb A1c Recent Labs    06/01/21 1914  HGBA1C 6.5*   Lipid Profile No results for input(s): CHOL, HDL, LDLCALC, TRIG, CHOLHDL, LDLDIRECT in the last 72 hours. Thyroid function studies No results for input(s): TSH, T4TOTAL, T3FREE, THYROIDAB in the last 72 hours.  Invalid input(s): FREET3 Anemia work up No results for input(s): VITAMINB12, FOLATE, FERRITIN, TIBC, IRON, RETICCTPCT in the last 72 hours. Urinalysis     Component Value Date/Time   COLORURINE YELLOW 06/01/2021 1934   APPEARANCEUR CLEAR 06/01/2021 1934   APPEARANCEUR Clear 06/10/2020 0851   LABSPEC 1.015 06/01/2021 1934   PHURINE 5.0 06/01/2021 1934   GLUCOSEU >=500 (A) 06/01/2021 1934   HGBUR NEGATIVE 06/01/2021 1934   BILIRUBINUR NEGATIVE 06/01/2021 1934   BILIRUBINUR Negative 06/10/2020 0851   KETONESUR NEGATIVE 06/01/2021 1934   PROTEINUR NEGATIVE 06/01/2021 1934   UROBILINOGEN 2.0 (A) 04/09/2020 1050   NITRITE NEGATIVE 06/01/2021 1934   LEUKOCYTESUR NEGATIVE 06/01/2021 1934   Sepsis Labs Invalid input(s): PROCALCITONIN,  WBC,  LACTICIDVEN Microbiology Recent Results (from the past 240 hour(s))  Resp Panel by RT-PCR (Flu A&B, Covid) Nasopharyngeal Swab     Status: None   Collection Time: 06/01/21  8:34 PM   Specimen: Nasopharyngeal Swab; Nasopharyngeal(NP) swabs in vial transport medium  Result Value Ref Range Status   SARS Coronavirus 2 by RT PCR NEGATIVE NEGATIVE Final    Comment: (NOTE) SARS-CoV-2 target nucleic acids are NOT DETECTED.  The SARS-CoV-2 RNA is generally detectable in upper respiratory specimens during the acute phase of infection. The lowest concentration of SARS-CoV-2 viral copies this assay can detect is 138 copies/mL. A negative result does not preclude SARS-Cov-2 infection and should not be used as the sole basis for treatment or other patient management decisions. A negative result may occur with  improper specimen collection/handling, submission of specimen other than nasopharyngeal swab, presence of viral mutation(s) within the areas targeted by this assay, and inadequate number of viral copies(<138 copies/mL). A negative result must be combined with clinical observations, patient history, and epidemiological information. The expected result is Negative.  Fact Sheet for Patients:  EntrepreneurPulse.com.au  Fact Sheet for Healthcare Providers:   IncredibleEmployment.be  This test is no t yet approved or cleared by the [place] FDA and  has  been authorized for detection and/or diagnosis of SARS-CoV-2 by FDA under an Emergency Use Authorization (EUA). This EUA will remain  in effect (meaning this test can be used) for the duration of the COVID-19 declaration under Section 564(b)(1) of the Act, 21 U.S.C.section 360bbb-3(b)(1), unless the authorization is terminated  or revoked sooner.       Influenza A by PCR NEGATIVE NEGATIVE Final   Influenza B by PCR NEGATIVE NEGATIVE Final    Comment: (NOTE) The Xpert Xpress SARS-CoV-2/FLU/RSV plus assay is intended as an aid in the diagnosis of influenza from Nasopharyngeal swab specimens and should not be used as a sole basis for treatment. Nasal washings and aspirates are unacceptable for Xpert Xpress SARS-CoV-2/FLU/RSV testing.  Fact Sheet for Patients: EntrepreneurPulse.com.au  Fact Sheet for Healthcare Providers: IncredibleEmployment.be  This test is not yet approved or cleared by the [place] FDA and has been authorized for detection and/or diagnosis of SARS-CoV-2 by FDA under an Emergency Use Authorization (EUA). This EUA will remain in effect (meaning this test can be used) for the duration of the COVID-19 declaration under Section 564(b)(1) of the Act, 21 U.S.C. section 360bbb-3(b)(1), unless the authorization is terminated or revoked.  Performed at [facility], [address], [place], [place] [address]      Time coordinating discharge:  35 minutes  SIGNED:   Barb Merino, MD  [facility] 06/02/2021, 12:30 PM

## 2021-06-03 ENCOUNTER — Telehealth: Payer: Self-pay

## 2021-06-03 ENCOUNTER — Encounter (HOSPITAL_COMMUNITY)
Admission: AD | Disposition: A | Discharge status: Home / Self Care | Payer: Self-pay | Source: Ambulatory Visit | Attending: Family Medicine

## 2021-06-03 ENCOUNTER — Encounter (HOSPITAL_COMMUNITY): Payer: Self-pay | Admitting: Family Medicine

## 2021-06-03 ENCOUNTER — Observation Stay (HOSPITAL_COMMUNITY)
Admission: AD | Admit: 2021-06-03 | Discharge: 2021-06-05 | Disposition: A | Discharge status: Home / Self Care | Payer: Medicare Other | Source: Ambulatory Visit | Attending: Family Medicine | Admitting: Family Medicine

## 2021-06-03 ENCOUNTER — Other Ambulatory Visit: Payer: Self-pay

## 2021-06-03 DIAGNOSIS — C7931 Secondary malignant neoplasm of brain: Secondary | ICD-10-CM | POA: Diagnosis not present

## 2021-06-03 DIAGNOSIS — I10 Essential (primary) hypertension: Secondary | ICD-10-CM | POA: Diagnosis present

## 2021-06-03 DIAGNOSIS — I4891 Unspecified atrial fibrillation: Secondary | ICD-10-CM | POA: Insufficient documentation

## 2021-06-03 DIAGNOSIS — I129 Hypertensive chronic kidney disease with stage 1 through stage 4 chronic kidney disease, or unspecified chronic kidney disease: Secondary | ICD-10-CM | POA: Diagnosis not present

## 2021-06-03 DIAGNOSIS — R0602 Shortness of breath: Secondary | ICD-10-CM | POA: Insufficient documentation

## 2021-06-03 DIAGNOSIS — I5042 Chronic combined systolic (congestive) and diastolic (congestive) heart failure: Secondary | ICD-10-CM | POA: Diagnosis present

## 2021-06-03 DIAGNOSIS — I5022 Chronic systolic (congestive) heart failure: Secondary | ICD-10-CM | POA: Insufficient documentation

## 2021-06-03 DIAGNOSIS — Z79899 Other long term (current) drug therapy: Secondary | ICD-10-CM | POA: Insufficient documentation

## 2021-06-03 DIAGNOSIS — Z794 Long term (current) use of insulin: Secondary | ICD-10-CM | POA: Insufficient documentation

## 2021-06-03 DIAGNOSIS — C8258 Diffuse follicle center lymphoma, lymph nodes of multiple sites: Secondary | ICD-10-CM

## 2021-06-03 DIAGNOSIS — Z7901 Long term (current) use of anticoagulants: Secondary | ICD-10-CM | POA: Insufficient documentation

## 2021-06-03 DIAGNOSIS — N1832 Chronic kidney disease, stage 3b: Secondary | ICD-10-CM | POA: Insufficient documentation

## 2021-06-03 DIAGNOSIS — C8334 Diffuse large B-cell lymphoma, lymph nodes of axilla and upper limb: Principal | ICD-10-CM | POA: Insufficient documentation

## 2021-06-03 DIAGNOSIS — Z7189 Other specified counseling: Secondary | ICD-10-CM

## 2021-06-03 DIAGNOSIS — D6869 Other thrombophilia: Secondary | ICD-10-CM

## 2021-06-03 DIAGNOSIS — N171 Acute kidney failure with acute cortical necrosis: Secondary | ICD-10-CM

## 2021-06-03 DIAGNOSIS — N179 Acute kidney failure, unspecified: Secondary | ICD-10-CM | POA: Diagnosis present

## 2021-06-03 DIAGNOSIS — Z515 Encounter for palliative care: Secondary | ICD-10-CM

## 2021-06-03 DIAGNOSIS — Z23 Encounter for immunization: Secondary | ICD-10-CM | POA: Diagnosis not present

## 2021-06-03 DIAGNOSIS — C8590 Non-Hodgkin lymphoma, unspecified, unspecified site: Secondary | ICD-10-CM | POA: Diagnosis present

## 2021-06-03 DIAGNOSIS — E875 Hyperkalemia: Secondary | ICD-10-CM | POA: Diagnosis present

## 2021-06-03 DIAGNOSIS — Z85828 Personal history of other malignant neoplasm of skin: Secondary | ICD-10-CM | POA: Insufficient documentation

## 2021-06-03 DIAGNOSIS — C969 Malignant neoplasm of lymphoid, hematopoietic and related tissue, unspecified: Secondary | ICD-10-CM | POA: Diagnosis present

## 2021-06-03 DIAGNOSIS — K219 Gastro-esophageal reflux disease without esophagitis: Secondary | ICD-10-CM | POA: Diagnosis present

## 2021-06-03 DIAGNOSIS — C8290 Follicular lymphoma, unspecified, unspecified site: Secondary | ICD-10-CM | POA: Diagnosis present

## 2021-06-03 LAB — BRAIN NATRIURETIC PEPTIDE: B Natriuretic Peptide: 68 pg/mL (ref 0.0–100.0)

## 2021-06-03 LAB — CREATININE, SERUM
Creatinine, Ser: 1.7 mg/dL — ABNORMAL HIGH (ref 0.61–1.24)
GFR, Estimated: 42 mL/min — ABNORMAL LOW (ref >60)

## 2021-06-03 LAB — POCT I-STAT, CHEM 8
BUN: 46 mg/dL — ABNORMAL HIGH (ref 8–23)
Calcium, Ion: 1.12 mmol/L — ABNORMAL LOW (ref 1.15–1.40)
Chloride: 102 mmol/L (ref 98–111)
Creatinine, Ser: 1.7 mg/dL — ABNORMAL HIGH (ref 0.61–1.24)
Glucose, Bld: 122 mg/dL — ABNORMAL HIGH (ref 70–99)
HCT: 44 % (ref 39.0–52.0)
Hemoglobin: 15 g/dL (ref 13.0–17.0)
Potassium: 5.9 mmol/L — ABNORMAL HIGH (ref 3.5–5.1)
Sodium: 132 mmol/L — ABNORMAL LOW (ref 135–145)
TCO2: 21 mmol/L — ABNORMAL LOW (ref 22–32)

## 2021-06-03 LAB — GLUCOSE, CAPILLARY
Glucose-Capillary: 214 mg/dL — ABNORMAL HIGH (ref 70–99)
Glucose-Capillary: 258 mg/dL — ABNORMAL HIGH (ref 70–99)
Glucose-Capillary: 211 mg/dL — ABNORMAL HIGH (ref 70–99)

## 2021-06-03 LAB — CBC
HCT: 45.3 % (ref 39.0–52.0)
Hemoglobin: 15.4 g/dL (ref 13.0–17.0)
MCH: 30.8 pg (ref 26.0–34.0)
MCHC: 34 g/dL (ref 30.0–36.0)
MCV: 90.6 fL (ref 80.0–100.0)
Platelets: 185 10*3/uL (ref 150–400)
RBC: 5 MIL/uL (ref 4.22–5.81)
RDW: 12.3 % (ref 11.5–15.5)
WBC: 16.8 10*3/uL — ABNORMAL HIGH (ref 4.0–10.5)
nRBC: 0 % (ref 0.0–0.2)

## 2021-06-03 SURGERY — AXILLARY LYMPH NODE BIOPSY
Anesthesia: Choice | Laterality: Left

## 2021-06-03 MED ORDER — TRAZODONE HCL 50 MG PO TABS
25.0000 mg | ORAL_TABLET | Freq: Every evening | ORAL | Status: DC | PRN
  Filled 2021-06-03: qty 1

## 2021-06-03 MED ORDER — SODIUM POLYSTYRENE SULFONATE 15 GM/60ML PO SUSP
30.0000 g | Freq: Once | ORAL | Status: AC
  Administered 2021-06-03: 30 g via ORAL
  Filled 2021-06-03: qty 120

## 2021-06-03 MED ORDER — IPRATROPIUM BROMIDE 0.02 % IN SOLN
0.5000 mg | Freq: Four times a day (QID) | RESPIRATORY_TRACT | Status: DC | PRN
  Filled 2021-06-03: qty 2.5

## 2021-06-03 MED ORDER — SODIUM ZIRCONIUM CYCLOSILICATE 5 G PO PACK
5.0000 g | PACK | Freq: Every day | ORAL | Status: DC
  Administered 2021-06-03 – 2021-06-04 (×2): 5 g via ORAL
  Filled 2021-06-03 (×2): qty 1

## 2021-06-03 MED ORDER — OXYCODONE HCL 5 MG PO TABS: 5.0000 mg | ORAL_TABLET | ORAL | Status: DC | PRN

## 2021-06-03 MED ORDER — SODIUM CHLORIDE 0.9 % IV SOLN: INTRAVENOUS | Status: AC

## 2021-06-03 MED ORDER — ISOSORBIDE MONONITRATE ER 30 MG PO TB24
15.0000 mg | ORAL_TABLET | Freq: Every day | ORAL | Status: DC
  Administered 2021-06-04: 15 mg via ORAL
  Filled 2021-06-03 (×2): qty 1

## 2021-06-03 MED ORDER — ONDANSETRON HCL 4 MG PO TABS
4.0000 mg | ORAL_TABLET | Freq: Four times a day (QID) | ORAL | Status: DC | PRN
  Filled 2021-06-03: qty 1

## 2021-06-03 MED ORDER — SODIUM CHLORIDE 0.9 % IV SOLN: INTRAVENOUS | Status: DC

## 2021-06-03 MED ORDER — INSULIN ASPART 100 UNIT/ML IJ SOLN
0.0000 [IU] | Freq: Three times a day (TID) | INTRAMUSCULAR | Status: DC
  Administered 2021-06-03: 3 [IU] via SUBCUTANEOUS

## 2021-06-03 MED ORDER — SODIUM CHLORIDE 0.9 % IV SOLN: 250.0000 mL | INTRAVENOUS | Status: DC | PRN

## 2021-06-03 MED ORDER — ONDANSETRON HCL 4 MG/2ML IJ SOLN: 4.0000 mg | Freq: Four times a day (QID) | INTRAMUSCULAR | Status: DC | PRN

## 2021-06-03 MED ORDER — PANTOPRAZOLE SODIUM 40 MG PO TBEC
40.0000 mg | DELAYED_RELEASE_TABLET | Freq: Every day | ORAL | Status: DC
  Administered 2021-06-04 – 2021-06-05 (×2): 40 mg via ORAL
  Filled 2021-06-03 (×2): qty 1

## 2021-06-03 MED ORDER — FUROSEMIDE 20 MG PO TABS
20.0000 mg | ORAL_TABLET | ORAL | Status: DC
  Administered 2021-06-04: 20 mg via ORAL
  Filled 2021-06-03 (×2): qty 1

## 2021-06-03 MED ORDER — LEVALBUTEROL HCL 0.63 MG/3ML IN NEBU: 0.6300 mg | INHALATION_SOLUTION | Freq: Four times a day (QID) | RESPIRATORY_TRACT | Status: DC | PRN

## 2021-06-03 MED ORDER — BUPIVACAINE LIPOSOME 1.3 % IJ SUSP
INTRAMUSCULAR | Status: AC
  Filled 2021-06-03: qty 20

## 2021-06-03 MED ORDER — BISACODYL 5 MG PO TBEC
5.0000 mg | DELAYED_RELEASE_TABLET | Freq: Every day | ORAL | Status: DC | PRN
  Filled 2021-06-03: qty 1

## 2021-06-03 MED ORDER — METOPROLOL SUCCINATE ER 25 MG PO TB24
25.0000 mg | ORAL_TABLET | Freq: Every day | ORAL | Status: DC
  Filled 2021-06-03 (×2): qty 1

## 2021-06-03 MED ORDER — HYDROMORPHONE HCL 1 MG/ML IJ SOLN: 0.5000 mg | INTRAMUSCULAR | Status: DC | PRN

## 2021-06-03 MED ORDER — ACETAMINOPHEN 650 MG RE SUPP
650.0000 mg | Freq: Four times a day (QID) | RECTAL | Status: DC | PRN
  Filled 2021-06-03: qty 1

## 2021-06-03 MED ORDER — SODIUM CHLORIDE 0.9% FLUSH
3.0000 mL | Freq: Two times a day (BID) | INTRAVENOUS | Status: DC
  Administered 2021-06-04 (×2): 3 mL via INTRAVENOUS

## 2021-06-03 MED ORDER — DEXAMETHASONE 4 MG PO TABS
2.0000 mg | ORAL_TABLET | Freq: Two times a day (BID) | ORAL | Status: DC
  Administered 2021-06-03 – 2021-06-04 (×3): 2 mg via ORAL
  Filled 2021-06-03 (×4): qty 1

## 2021-06-03 MED ORDER — ACETAMINOPHEN 325 MG PO TABS: 650.0000 mg | ORAL_TABLET | Freq: Four times a day (QID) | ORAL | Status: DC | PRN

## 2021-06-03 MED ORDER — SENNOSIDES-DOCUSATE SODIUM 8.6-50 MG PO TABS
1.0000 | ORAL_TABLET | Freq: Every evening | ORAL | Status: DC | PRN
  Filled 2021-06-03: qty 1

## 2021-06-03 MED ORDER — HEPARIN SODIUM (PORCINE) 5000 UNIT/ML IJ SOLN
5000.0000 [IU] | Freq: Three times a day (TID) | INTRAMUSCULAR | Status: DC
  Administered 2021-06-03 – 2021-06-05 (×6): 5000 [IU] via SUBCUTANEOUS
  Filled 2021-06-03 (×6): qty 1

## 2021-06-03 MED ORDER — HYDRALAZINE HCL 20 MG/ML IJ SOLN: 10.0000 mg | INTRAMUSCULAR | Status: DC | PRN

## 2021-06-03 MED ORDER — ALBUTEROL SULFATE (2.5 MG/3ML) 0.083% IN NEBU: 3.0000 mL | INHALATION_SOLUTION | Freq: Four times a day (QID) | RESPIRATORY_TRACT | Status: DC | PRN

## 2021-06-03 MED ORDER — HYDRALAZINE HCL 25 MG PO TABS
100.0000 mg | ORAL_TABLET | Freq: Two times a day (BID) | ORAL | Status: DC
  Administered 2021-06-03 – 2021-06-04 (×2): 100 mg via ORAL
  Filled 2021-06-03 (×4): qty 4

## 2021-06-03 MED ORDER — SODIUM CHLORIDE 0.9% FLUSH: 3.0000 mL | INTRAVENOUS | Status: DC | PRN

## 2021-06-03 MED ORDER — AMLODIPINE BESYLATE 5 MG PO TABS
10.0000 mg | ORAL_TABLET | Freq: Every day | ORAL | Status: DC
  Administered 2021-06-04 – 2021-06-05 (×2): 10 mg via ORAL
  Filled 2021-06-03 (×2): qty 2

## 2021-06-03 MED ORDER — SODIUM CHLORIDE 0.9% FLUSH: 3.0000 mL | Freq: Two times a day (BID) | INTRAVENOUS | Status: DC

## 2021-06-03 MED ORDER — AMIODARONE HCL 200 MG PO TABS
200.0000 mg | ORAL_TABLET | Freq: Every day | ORAL | Status: DC
  Administered 2021-06-04 – 2021-06-05 (×2): 200 mg via ORAL
  Filled 2021-06-03 (×2): qty 1

## 2021-06-03 MED ORDER — INSULIN ASPART 100 UNIT/ML IJ SOLN
0.0000 [IU] | Freq: Three times a day (TID) | INTRAMUSCULAR | Status: DC
Start: 2021-06-04 — End: 2021-06-05
  Administered 2021-06-04: 2 [IU] via SUBCUTANEOUS
  Administered 2021-06-04: 7 [IU] via SUBCUTANEOUS
  Administered 2021-06-04: 5 [IU] via SUBCUTANEOUS
  Administered 2021-06-05: 3 [IU] via SUBCUTANEOUS

## 2021-06-03 MED ORDER — INFLUENZA VAC A&B SA ADJ QUAD 0.5 ML IM PRSY
0.5000 mL | PREFILLED_SYRINGE | INTRAMUSCULAR | Status: AC
  Administered 2021-06-04: 0.5 mL via INTRAMUSCULAR
  Filled 2021-06-03 (×2): qty 0.5

## 2021-06-03 NOTE — Progress Notes (Signed)
Surgery canceled due to hyperkalemia of 5.9.  Hospitalist will admit the patient for treatment of his hyperkalemia.  Hopefully can reschedule for surgery on 06/05/2021 by Dr. Constance Haw.

## 2021-06-03 NOTE — H&P (Signed)
History and Physical   Patient: Darius Smith                            PCP: [name], MD                    DOB: [birth year]            DOA: 06/03/2021 IWP:[id number]             DOS: 06/03/2021, 12:36 PM  [name], MD  Patient coming from:   HOME  I have personally reviewed patient's medical records, in electronic medical records, including:  [facility], and care everywhere.    Chief Complaint:   Hyperkalemia   History of present illness:    Darius Smith is a 75 y.o. 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 Was recently admitted for hyperkalemia, hyperglycemia, a on CKD polyuria, polydipsia... Patient was discharged yesterday... With plan which to present today 06/03/2021 for left axillary lymph node biopsy by Dr. Arnoldo Morale  Preop labs once again revealed potassium of 5.9 Patient was discharged on medication Lokelma 5 mg daily which needed preauthorization insurance did not authorize for medication..     Patient Denies having: Fever, Chills, Cough, SOB, Chest Pain, Abd pain, N/V/D, headache, dizziness, lightheadedness,  Dysuria, Joint pain, rash, open wounds  ED Course:   There were no vitals filed for this visit. Abnormal labs;   Review of Systems: As per HPI, otherwise 10 point review of systems were negative.   ----------------------------------------------------------------------------------------------------------------------  Allergies  Allergen Reactions   Augmentin [Amoxicillin-Pot Clavulanate] Other (See Comments)    Increased liver enzymes Has patient had a PCN reaction causing immediate rash, facial/tongue/throat swelling, SOB or lightheadedness with hypotension: No Has patient had a PCN reaction causing severe rash involving mucus membranes or skin necrosis: No Has patient had a PCN reaction that required hospitalization:  No Has patient had a PCN reaction occurring within the last 10 years: No  If all of the above answers are "NO", then may proceed with Cephalosporin use.    Flomax [Tamsulosin Hcl] Nausea Only    Dizziness  Pt is on flomax    Home MEDs:  Prior to Admission medications   Medication Sig Start Date End Date Taking? Authorizing Provider  amiodarone (PACERONE) 200 MG tablet Take 1 tablet (200 mg total) by mouth daily. 02/23/21  Yes Fenton, Clint R, PA  amLODipine (NORVASC) 10 MG tablet Take 1 tablet (10 mg total) by mouth daily. 12/10/20  Yes BranchAlphonse Guild, MD  blood glucose meter kit and supplies KIT Inject 1 each into the skin 4 (four) times daily -  before meals and at bedtime. Dispense based on patient and insurance preference. Use up to four times daily as directed. 06/02/21  Yes Barb Merino, MD  cetirizine (ZYRTEC) 10 MG tablet Take 1 tablet (10 mg total) by mouth daily as needed for allergies. 01/01/20  Yes Corum, Rex Kras, MD  dexamethasone (DECADRON) 2 MG tablet Take 1 tablet (2 mg total) by mouth 2 (two) times daily. 06/01/21  Yes Derek Jack, MD  ELIQUIS 5 MG TABS tablet TAKE ONE TABLET BY MOUTH 2 TIMES A DAY. 12/10/20  Yes Branch, Alphonse Guild, MD  hydrALAZINE (APRESOLINE) 100 MG tablet TAKE (1) TABLET BY MOUTH (3) TIMES DAILY. Patient  taking differently: Take 100 mg by mouth 2 (two) times daily. 05/27/21  Yes Branch, Alphonse Guild, MD  insulin lispro (HUMALOG KWIKPEN) 100 UNIT/ML KwikPen Inject 1-10 Units into the skin 4 (four) times daily -  before meals and at bedtime. Blood sugars 70-120: 1 unit 121-150: 2 units 151-200: 3 units 201-250: 4 units 251-300: 5 units 301-350: 6 units 351-400: 9 units More than 400, 10 units and call your doctor 06/02/21  Yes Barb Merino, MD  Insulin Pen Needle 29G X 12MM MISC 1 each by Does not apply route 4 (four) times daily -  before meals and at bedtime. 06/02/21  Yes Barb Merino, MD  isosorbide mononitrate (IMDUR) 30 MG 24 hr tablet TAKE 1/2  TABLET BY MOUTH ONCE DAILY. 11/10/20  Yes BranchAlphonse Guild, MD  metoprolol succinate (TOPROL XL) 25 MG 24 hr tablet Take 1 tablet (25 mg total) by mouth daily. 01/12/21  Yes BranchAlphonse Guild, MD  omeprazole (PRILOSEC) 20 MG capsule Take 20 mg by mouth daily as needed (reflux). 01/01/20  Yes [provider]  VENTOLIN HFA 108 (90 Base) MCG/ACT inhaler Inhale 2 puffs into the lungs every 6 (six) hours as needed for wheezing or shortness of breath. 07/11/20  Yes Fayrene Helper, MD  acetaminophen (TYLENOL) 500 MG tablet Take 1,000 mg by mouth every 6 (six) hours as needed for moderate pain or headache.    [provider]  furosemide (LASIX) 20 MG tablet Take 1 tablet (20 mg total) by mouth every other day. (MAY TAKE DAILY AS NEEDED FOR INCREASED SWELLING) 04/21/21   Arnoldo Lenis, MD  sodium zirconium cyclosilicate (LOKELMA) 5 g packet Take 5 g by mouth daily. 06/02/21 07/02/21  Barb Merino, MD    PRN MEDs: sodium chloride, acetaminophen **OR** acetaminophen, bisacodyl, hydrALAZINE, HYDROmorphone (DILAUDID) injection, ipratropium, levalbuterol, ondansetron **OR** ondansetron (ZOFRAN) IV, oxyCODONE, senna-docusate, sodium chloride flush, traZODone  Past Medical History:  Diagnosis Date   Allergic rhinitis 07/02/2019   Arthritis    both hands   Atrial fibrillation ([place]) 10/28/2017   Dr. Harl Bowie   Atrial fibrillation with RVR ([place]) 07/14/2020   Blood in urine    occ   Bronchitis    Cancer ([place])    Basal cell   Cardiomegaly 10/28/2017   CHF (congestive heart failure) ([place])    Complication of anesthesia    Mr. Goulette developed a. fib in recovery after cysto procedure 10/2017 was transferred to [facility] and underwent successful cardioversion   Diverticulosis of sigmoid colon 08/2017   Noted on colonoscopy   Dysrhythmia    External hemorrhoids 08/2017   GERD (gastroesophageal reflux disease)    History of colon polyps 08/2017   History of kidney stones    History of  rectal bleeding    History of right inguinal hernia    Hypertension    Lung cancer ([place]) 06/25/2020   Lymphoma ([place])    Nephrolithiasis 07/02/2019   Rectal bleeding 09/02/2017   Added automatically from request for surgery [id number]   Shortness of breath    Stage 3 chronic kidney disease [facility])     Past Surgical History:  Procedure Laterality Date   CARDIOVERSION     10/2017   CARDIOVERSION N/A 07/16/2020   Procedure: CARDIOVERSION;  Surgeon: Arnoldo Lenis, MD;  [facility];  Service: Endoscopy;  Laterality: N/A;   CARDIOVERSION N/A 08/21/2020   Procedure: CARDIOVERSION;  Surgeon: Satira Sark, MD;  Location: [facility] ORS;  Service: Cardiovascular;  Laterality: N/A;  CARDIOVERSION N/A 09/10/2020   Procedure: CARDIOVERSION;  Surgeon: Josue Hector, MD;  Location: [facility] ENDOSCOPY;  Service: Cardiovascular;  Laterality: N/A;   CHOLECYSTECTOMY     CLEFT LIP REPAIR     several from childhood til 75 years old   [employer]     several from childhood til 75 years old   COLONOSCOPY  04/28/2011   Procedure: COLONOSCOPY;  Surgeon: Rogene Houston, MD;  Location: [facility];  Service: Endoscopy;  Laterality: N/A;   COLONOSCOPY N/A 07/18/2014   Procedure: COLONOSCOPY;  Surgeon: Rogene Houston, MD;  [facility]: [facility];  Service: Endoscopy;  Laterality: N/A;  830   COLONOSCOPY N/A 09/09/2017   Procedure: COLONOSCOPY;  Surgeon: Rogene Houston, MD;  Location: [facility];  Service: Endoscopy;  Laterality: N/A;  955   CYSTOSCOPY/URETEROSCOPY/HOLMIUM LASER/STENT PLACEMENT Bilateral 12/06/2017   Procedure: CYSTOSCOPY/RETROGRADE/URETEROSCOPY/HOLMIUM LASER/STENT EXCHANGE;  Surgeon: Festus Aloe, MD;  Location: [facility] ORS;  Service: Urology;  Laterality: Bilateral;  ONLY NEEDS 60 MIN   HERNIA REPAIR     POLYPECTOMY  09/09/2017   Procedure: POLYPECTOMY;  Surgeon: Rogene Houston, MD;  Location: [facility];  Service: Endoscopy;;  colon   vocal cord surgery        reports that he has never smoked. He has never used smokeless tobacco. He reports that he does not currently use alcohol after a past usage of about 7.0 standard drinks per week. He reports that he does not use drugs.   Family History  Problem Relation Age of Onset   Breast cancer Mother    Prostate cancer Father    COPD Sister     Physical Exam:   There were no vitals filed for this visit. Constitutional: NAD, calm, comfortable Eyes: PERRL, lids and conjunctivae normal ENMT: Mucous membranes are moist. Posterior pharynx clear of any exudate or lesions.Normal dentition.  Neck: normal, supple, no masses, no thyromegaly Respiratory: clear to auscultation bilaterally, no wheezing, no crackles. Normal respiratory effort. No accessory muscle use.  Cardiovascular: Regular rate and rhythm, no murmurs / rubs / gallops. No extremity edema. 2+ pedal pulses. No carotid bruits.  Abdomen: no tenderness, no masses palpated. No hepatosplenomegaly. Bowel sounds positive.  Musculoskeletal: no clubbing / cyanosis. No joint deformity upper and lower extremities. Good ROM, no contractures. Normal muscle tone.  Neurologic: CN II-XII grossly intact. Sensation intact, DTR normal. Strength 5/5 in all 4.  Psychiatric: Normal judgment and insight. Alert and oriented x 3. Normal mood.  Skin: no rashes, lesions, ulcers. No induration Decubitus/ulcers:  Wounds: per nursing documentation     Labs on admission:    I have personally reviewed following labs and imaging studies  CBC: Recent Labs  Lab 06/01/21 1503 06/01/21 1914 06/02/21 0442 06/03/21 1026  WBC 24.6* 25.3* 21.1*  --   NEUTROABS 22.5* 23.1*  --   --   HGB 15.5 15.2 13.4 15.0  HCT 46.0 45.5 39.7 44.0  MCV 92.6 93.0 90.6  --   PLT 199 225 162  --    Basic Metabolic Panel: Recent Labs  Lab 06/01/21 1503 06/01/21 1914 06/01/21 1929 06/01/21 2138 06/02/21 0442 06/03/21 1026  NA 128* 124*  --   --  130* 132*  K 6.8* 6.6*  --   --   5.6* 5.9*  CL 104 103  --   --  100 102  CO2 15* 12*  --   --  22  --   GLUCOSE 434* 395*  --  149* 147* 122*  BUN 70* 72*  --   --  54* 46*  CREATININE 2.11* 2.15*  --   --  1.54* 1.70*  CALCIUM 8.7* 8.5*  --   --  7.8*  --   PHOS  --   --  4.3  --   --   --    GFR: Estimated Creatinine Clearance: 40.6 mL/min (A) (by C-G formula based on SCr of 1.7 mg/dL (H)). Liver Function Tests: Recent Labs  Lab 06/01/21 1914 06/02/21 0442  AST 22 19  ALT 33 28  ALKPHOS 60 40  BILITOT 0.8 1.2  PROT 6.8 5.5*  ALBUMIN 3.3* 2.7*    HbA1C: Recent Labs    06/01/21 1914  HGBA1C 6.5*   CBG: Recent Labs  Lab 06/01/21 1926 06/01/21 2229 06/02/21 0807  GLUCAP 350* 135* 122*    Urine analysis:    Component Value Date/Time   COLORURINE YELLOW 06/01/2021 1934   APPEARANCEUR CLEAR 06/01/2021 1934   APPEARANCEUR Clear 06/10/2020 0851   LABSPEC 1.015 06/01/2021 1934   PHURINE 5.0 06/01/2021 1934   GLUCOSEU >=500 (A) 06/01/2021 1934   HGBUR NEGATIVE 06/01/2021 1934   BILIRUBINUR NEGATIVE 06/01/2021 1934   BILIRUBINUR Negative 06/10/2020 0851   KETONESUR NEGATIVE 06/01/2021 1934   PROTEINUR NEGATIVE 06/01/2021 1934   UROBILINOGEN 2.0 (A) 04/09/2020 1050   NITRITE NEGATIVE 06/01/2021 1934   LEUKOCYTESUR NEGATIVE 06/01/2021 1934     Radiologic Exams on Admission:   US Renal  Result Date: 06/02/2021 CLINICAL DATA:  Chronic kidney disease. EXAM: RENAL / URINARY TRACT ULTRASOUND COMPLETE COMPARISON:  October 02, 2019. FINDINGS: Right Kidney: Renal measurements: 12.4 x 5.8 x 5.8 cm = volume: 220 mL. Mild cortical thinning is noted. Increased echogenicity of renal parenchyma is noted. No mass or hydronephrosis visualized. Left Kidney: Renal measurements: 12.1 x 6.2 x 6.8 cm = volume: 357 mL. 5.3 cm simple cyst is noted. Increased echogenicity of renal parenchyma is noted. No mass or hydronephrosis visualized. Bladder: Appears normal for degree of bladder distention. Ureteral jets are not  visualized. Other: None. IMPRESSION: Increased echogenicity of renal parenchyma is noted bilaterally consistent with medical renal disease. No definite hydronephrosis or renal obstruction is noted. Electronically Signed   By: Marijo Conception M.D.   On: 06/02/2021 09:52   DG Chest Port 1 View  Result Date: 06/01/2021 CLINICAL DATA:  Elevated potassium, leg weakness EXAM: PORTABLE CHEST 1 VIEW COMPARISON:  08/01/2020 FINDINGS: Lungs are clear.  No pleural effusion or pneumothorax. Heart is top-normal in size.  Prominent epicardial fat. IMPRESSION: No evidence of acute cardiopulmonary disease. Electronically Signed   By: Julian Hy M.D.   On: 06/01/2021 21:06    EKG:   Independently reviewed.  Orders placed or performed during the hospital encounter of 06/03/21   EKG 12-Lead   ---------------------------------------------------------------------------------------------------------------------------------    Assessment / Plan:   Principal Problem:   Hyperkalemia Active Problems:   Essential hypertension   GERD (gastroesophageal reflux disease)   Atrial fibrillation (HCC)   Follicular lymphoma (HCC)   Chronic systolic HF (heart failure) (HCC)   Stage 3b chronic kidney disease (HCC)   Acute on chronic renal failure (HCC)   DNR (do not resuscitate) discussion   Principal Problem:    Hyperkalemia - Serum potassium 5.6, 5.9 today - Stable, asymptomatic - will get an EKG  -Patient was discharged on medication Lokelma 5 mg daily which needed preauthorization insurance did not authorize for medication..  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 Delton Coombes primary oncologist following as an outpatient -General surgery Dr. Arnoldo Morale following for left  axillary lymph node biopsy For Eliquis is on hold, planning for biopsy once potassium stabilizes-   Diabetes mellitus type 2, with hyperglycemia  -Patient is on Decadron 4 lymphoma with brain mets, anticipating hyperglycemia -Continuing with SSI coverage,  -Prior difficulty with long-acting insulin due to hyperglycemic state, fluctuating p.o. intake -Hemoglobin A1c 6.5     Active Problems:  Essential hypertension -stable resume home medication including metoprolol, Norvasc, hydralazine   GERD (gastroesophageal reflux disease) -resuming PPI    Atrial fibrillation (HCC) -Eliquis on hold-on scheduled -pending left lymph node biopsy -Continue home medication of amiodarone, Toprol     Chronic systolic HF (heart failure) (HCC) -gentle IV fluid hydration for 12 hours, then resuming diuretics, monitoring daily weight, I's and O's  Acute on chronic -stage 3b chronic kidney disease ([place]) -elevated creatinine, gentle IV fluid hydration     DNR (do not resuscitate) discussion -confirmed by patient family, will need palliative care follow-up     Cultures:  -None  Antimicrobial: -None   Consults called:  Gen. Surgery  -------------------------------------------------------------------------------------------------------------------------------------------- DVT prophylaxis: Heparin SQ Code Status:   Code Status: Full Code   Admission status: Patient will be admitted as Inpatient, with a greater than 2 midnight length of stay. Level of care: Telemetry   Family Communication:  none at bedside  (The above findings and plan of care has been discussed with patient in detail, the patient expressed understanding and agreement of above plan)  --------------------------------------------------------------------------------------------------------------------------------------------------  Disposition Plan: >3 days Status is: Inpatient  Not inpatient appropriate, will call UM team and  downgrade to OBS.   Dispo: The patient is from: Home              Anticipated d/c is to: Home              Patient currently is not medically stable to d/c.   Difficult to place patient No   -------------------------------------------------------------------------------------------------------------------------------------------  Time spent: > than  55  Min.   SIGNED: Deatra James, MD, FHM. [facility],  Pager (Please use [email or web address] to page to text)  If 7PM-7AM, please contact night-coverage [email or web address],  06/03/2021, 12:36 PM

## 2021-06-03 NOTE — Telephone Encounter (Signed)
Transition Care Management Unsuccessful Follow-up Telephone Call  Date of discharge and from where:  [facility] currently admitted   Attempts:  1st Attempt  Reason for unsuccessful TCM follow-up call:  Unable to reach patient Currently admitted for Hyperkalemia

## 2021-06-03 NOTE — Progress Notes (Signed)
Bedside report given to Beverlee Nims in 312

## 2021-06-04 DIAGNOSIS — I4891 Unspecified atrial fibrillation: Secondary | ICD-10-CM | POA: Diagnosis not present

## 2021-06-04 DIAGNOSIS — C7931 Secondary malignant neoplasm of brain: Secondary | ICD-10-CM

## 2021-06-04 DIAGNOSIS — E875 Hyperkalemia: Secondary | ICD-10-CM

## 2021-06-04 DIAGNOSIS — N1832 Chronic kidney disease, stage 3b: Secondary | ICD-10-CM

## 2021-06-04 DIAGNOSIS — Z79899 Other long term (current) drug therapy: Secondary | ICD-10-CM | POA: Diagnosis not present

## 2021-06-04 DIAGNOSIS — Z85828 Personal history of other malignant neoplasm of skin: Secondary | ICD-10-CM | POA: Diagnosis not present

## 2021-06-04 DIAGNOSIS — I129 Hypertensive chronic kidney disease with stage 1 through stage 4 chronic kidney disease, or unspecified chronic kidney disease: Secondary | ICD-10-CM | POA: Diagnosis not present

## 2021-06-04 DIAGNOSIS — Z23 Encounter for immunization: Secondary | ICD-10-CM | POA: Diagnosis not present

## 2021-06-04 DIAGNOSIS — C8334 Diffuse large B-cell lymphoma, lymph nodes of axilla and upper limb: Secondary | ICD-10-CM | POA: Diagnosis not present

## 2021-06-04 DIAGNOSIS — C8258 Diffuse follicle center lymphoma, lymph nodes of multiple sites: Secondary | ICD-10-CM | POA: Diagnosis not present

## 2021-06-04 DIAGNOSIS — Z7901 Long term (current) use of anticoagulants: Secondary | ICD-10-CM | POA: Diagnosis not present

## 2021-06-04 DIAGNOSIS — I5022 Chronic systolic (congestive) heart failure: Secondary | ICD-10-CM | POA: Diagnosis not present

## 2021-06-04 DIAGNOSIS — R0602 Shortness of breath: Secondary | ICD-10-CM | POA: Diagnosis not present

## 2021-06-04 DIAGNOSIS — Z794 Long term (current) use of insulin: Secondary | ICD-10-CM | POA: Diagnosis not present

## 2021-06-04 LAB — CBC
HCT: 36.4 % — ABNORMAL LOW (ref 39.0–52.0)
Hemoglobin: 12.3 g/dL — ABNORMAL LOW (ref 13.0–17.0)
MCH: 31.1 pg (ref 26.0–34.0)
MCHC: 33.8 g/dL (ref 30.0–36.0)
MCV: 91.9 fL (ref 80.0–100.0)
Platelets: 137 10*3/uL — ABNORMAL LOW (ref 150–400)
RBC: 3.96 MIL/uL — ABNORMAL LOW (ref 4.22–5.81)
RDW: 12.2 % (ref 11.5–15.5)
WBC: 11.4 10*3/uL — ABNORMAL HIGH (ref 4.0–10.5)
nRBC: 0 % (ref 0.0–0.2)

## 2021-06-04 LAB — BASIC METABOLIC PANEL
Anion gap: 6 (ref 5–15)
BUN: 49 mg/dL — ABNORMAL HIGH (ref 8–23)
CO2: 24 mmol/L (ref 22–32)
Calcium: 7.9 mg/dL — ABNORMAL LOW (ref 8.9–10.3)
Chloride: 102 mmol/L (ref 98–111)
Creatinine, Ser: 1.59 mg/dL — ABNORMAL HIGH (ref 0.61–1.24)
GFR, Estimated: 45 mL/min — ABNORMAL LOW (ref >60)
Glucose, Bld: 160 mg/dL — ABNORMAL HIGH (ref 70–99)
Potassium: 4.6 mmol/L (ref 3.5–5.1)
Sodium: 132 mmol/L — ABNORMAL LOW (ref 135–145)

## 2021-06-04 LAB — GLUCOSE, CAPILLARY
Glucose-Capillary: 161 mg/dL — ABNORMAL HIGH (ref 70–99)
Glucose-Capillary: 274 mg/dL — ABNORMAL HIGH (ref 70–99)
Glucose-Capillary: 335 mg/dL — ABNORMAL HIGH (ref 70–99)
Glucose-Capillary: 274 mg/dL — ABNORMAL HIGH (ref 70–99)

## 2021-06-04 LAB — PROTIME-INR
INR: 1.1 (ref 0.8–1.2)
Prothrombin Time: 14.4 seconds (ref 11.4–15.2)

## 2021-06-04 NOTE — Care Management CC44 (Signed)
Condition Code 44 Documentation Completed  Patient Details  Name: Darius Smith MRN: [id number] Date of Birth: Nov 27, 1945   Condition Code 44 given:  Yes Patient signature on Condition Code 44 notice:  Yes Documentation of 2 MD's agreement:  Yes Code 44 added to claim:  Yes    Natasha Bence, LCSW 06/04/2021, 5:05 PM

## 2021-06-04 NOTE — Progress Notes (Signed)
PROGRESS NOTE    Patient: Darius Smith                            PCP: [name], MD                    DOB: 11-08-1945            DOA: 06/03/2021 [id number]:[id number]             DOS: 06/04/2021, 1:40 PM   LOS: 1 day   Date of Service: The patient was seen and examined on 06/04/2021  Subjective:   The patient was seen and examined this morning. Hemodynamically stable, potassium improved, denies any chest pain or shortness of breath. Otherwise no issues overnight .  Brief Narrative:   Darius Smith is a 75 y.o. 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 Was recently admitted for hyperkalemia, hyperglycemia, a on CKD polyuria, polydipsia... Patient was discharged yesterday... With plan which to present today 06/03/2021 for left axillary lymph node biopsy by Dr. Arnoldo Morale   Preop labs once again revealed potassium of 5.9 Patient was discharged on medication Lokelma 5 mg daily which needed preauthorization insurance did not authorize for medication..   Assessment & Plan:   Principal Problem:   Hyperkalemia Active Problems:   Essential hypertension   GERD (gastroesophageal reflux disease)   Atrial fibrillation (HCC)   Follicular lymphoma (HCC)   Chronic systolic HF (heart failure) (HCC)   Stage 3b chronic kidney disease (HCC)   Acute on chronic renal failure (HCC)   DNR (do not resuscitate) discussion  Hyperkalemia - Serum potassium 5.6, 5.9  >>> 4.6 -Remained asymptomatic, stable, no significant changes in EKG  -Patient was discharged on medication Lokelma 5 mg daily which needed preauthorization insurance did not authorize for medication..  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 Delton Coombes primary oncologist following as an outpatient -General surgery Dr. Arnoldo Morale following for left axillary lymph node biopsy - General surgery planning for left axillary biopsy on 06/05/2021  For Eliquis is on hold, planning for biopsy once potassium stabilizes-     Diabetes mellitus type 2, with hyperglycemia  -Monitoring CBG closely -Patient is on Decadron 4 lymphoma with brain mets, anticipating hyperglycemia -Continuing with SSI coverage,  -Prior difficulty with long-acting insulin due to hyperglycemic state, fluctuating p.o. intake -Hemoglobin A1c 6.5         Active Problems:   Essential hypertension  -Stable -Resumed home medication including metoprolol, Norvasc, hydralazine   GERD (gastroesophageal reflux disease) -resuming PPI, stable    Atrial fibrillation (HCC)  -Stable -Eliquis on hold-on scheduled -pending left lymph node biopsy -Continue home medication of amiodarone, Toprol       Chronic systolic HF (heart failure) (HCC)  -S/P gentle IV fluid hydration for 12 hours, then resuming diuretics, monitoring daily weight, I's and O's   Acute on chronic -stage 3b chronic kidney disease (HCC)  -Creatinine 1.70 >> 1.59 today  -elevated creatinine, gentle IV fluid hydration       DNR (do not resuscitate) discussion -confirmed by patient family, will need palliative care follow-up  Cultures:  -None  Antimicrobial: -None    Consults called:  Gen. Surgery  -------------------------------------------------------------------------------------------------------------------------------------------- DVT prophylaxis: Heparin SQ Code Status:   Code Status: Full Code     Admission status: Patient will be admitted as Inpatient, with a greater than 2 midnight length of stay. Level of care: Telemetry     Family Communication:  none at bedside  (The above findings and plan of care has been discussed with  patient in detail, the patient expressed understanding and agreement of above plan)  --------------------------------------------------------------------------------------------------------------------------------------------------   Disposition Plan: >3 days Status is: Inpatient   Not inpatient appropriate, will call UM team and downgrade to OBS.    Dispo: The patient is from: Home              Anticipated d/c is to: Home              Patient currently is not medically stable to d/c.              Difficult to place patient No       Level of care: Telemetry   Procedures:   AXILLARY LYMPH NODE BIOPSY    Antimicrobials:  Anti-infectives (From admission, onward)    None        Medication:   amiodarone  200 mg Oral Daily   amLODipine  10 mg Oral Daily   dexamethasone  2 mg Oral BID   furosemide  20 mg Oral QODAY   heparin  5,000 Units Subcutaneous Q8H   hydrALAZINE  100 mg Oral BID   insulin aspart  0-9 Units Subcutaneous TID WC   isosorbide mononitrate  15 mg Oral Daily   metoprolol succinate  25 mg Oral Daily   pantoprazole  40 mg Oral Daily   sodium chloride flush  3 mL Intravenous Q12H   sodium chloride flush  3 mL Intravenous Q12H    sodium chloride, acetaminophen **OR** acetaminophen, albuterol, bisacodyl, hydrALAZINE, HYDROmorphone (DILAUDID) injection, ipratropium, levalbuterol, ondansetron **OR** ondansetron (ZOFRAN) IV, oxyCODONE, senna-docusate, sodium chloride flush, traZODone   Objective:   Vitals:   06/03/21 2039 06/04/21 0839 06/04/21 0841 06/04/21 1000  BP: 125/74 107/64 107/64   Pulse: 61  (!) 56   Resp: 18  20   Temp: 97.9 F (36.6 C)  97.7 F (36.5 C)   TempSrc:   Oral   SpO2: 97%  100%   Weight:    80.3 kg  Height:        Intake/Output Summary (Last 24 hours) at 06/04/2021 1340 Last data filed at 06/04/2021 1300 Gross per 24 hour  Intake 1056.58 ml  Output 450 ml  Net 606.58 ml   Filed Weights   06/03/21 1611 06/04/21 1000   Weight: 86.9 kg 80.3 kg     Examination:   Physical Exam  Constitution:  Alert, cooperative, no distress,  Appears calm and comfortable  Psychiatric: Normal and stable mood and affect, cognition intact,   HEENT: Normocephalic, PERRL, otherwise with in Normal limits  Chest:Chest symmetric Cardio vascular:  S1/S2, RRR, No murmure, No Rubs or Gallops  pulmonary: Clear to auscultation bilaterally, respirations unlabored, negative wheezes / crackles Abdomen: Soft, non-tender, non-distended, bowel sounds,no masses, no organomegaly Muscular skeletal: Limited exam - in bed, able to move all 4 extremities, Normal strength,  Neuro: CNII-XII intact. , normal motor and sensation, reflexes intact  Extremities: No pitting edema lower extremities, +2 pulses  Skin: Dry, warm to touch, negative for any Rashes, No open wounds Wounds: per nursing documentation    ------------------------------------------------------------------------------------------------------------------------------------------  LABs:  CBC Latest Ref Rng & Units 06/04/2021 06/03/2021 06/03/2021  WBC 4.0 - 10.5 K/uL 11.4(H) 16.8(H) -  Hemoglobin 13.0 - 17.0 g/dL 12.3(L) 15.4 15.0  Hematocrit 39.0 - 52.0 % 36.4(L) 45.3 44.0  Platelets 150 - 400 K/uL 137(L) 185 -   CMP Latest Ref Rng & Units 06/04/2021 06/03/2021 06/03/2021  Glucose 70 - 99 mg/dL 160(H) - 122(H)  BUN 8 - 23 mg/dL 49(H) - 46(H)  Creatinine 0.61 - 1.24 mg/dL 1.59(H) 1.70(H) 1.70(H)  Sodium 135 - 145 mmol/L 132(L) - 132(L)  Potassium 3.5 - 5.1 mmol/L 4.6 - 5.9(H)  Chloride 98 - 111 mmol/L 102 - 102  CO2 22 - 32 mmol/L 24 - -  Calcium 8.9 - 10.3 mg/dL 7.9(L) - -  Total Protein 6.5 - 8.1 g/dL - - -  Total Bilirubin 0.3 - 1.2 mg/dL - - -  Alkaline Phos 38 - 126 U/L - - -  AST 15 - 41 U/L - - -  ALT 0 - 44 U/L - - -       Micro Results Recent Results (from the past 240 hour(s))  Resp Panel by RT-PCR (Flu A&B, Covid) Nasopharyngeal Swab     Status: None    Collection Time: 06/01/21  8:34 PM   Specimen: Nasopharyngeal Swab; Nasopharyngeal(NP) swabs in vial transport medium  Result Value Ref Range Status   SARS Coronavirus 2 by RT PCR NEGATIVE NEGATIVE Final    Comment: (NOTE) SARS-CoV-2 target nucleic acids are NOT DETECTED.  The SARS-CoV-2 RNA is generally detectable in upper respiratory specimens during the acute phase of infection. The lowest concentration of SARS-CoV-2 viral copies this assay can detect is 138 copies/mL. A negative result does not preclude SARS-Cov-2 infection and should not be used as the sole basis for treatment or other patient management decisions. A negative result may occur with  improper specimen collection/handling, submission of specimen other than nasopharyngeal swab, presence of viral mutation(s) within the areas targeted by this assay, and inadequate number of viral copies(<138 copies/mL). A negative result must be combined with clinical observations, patient history, and epidemiological information. The expected result is Negative.  Fact Sheet for Patients:  EntrepreneurPulse.com.au  Fact Sheet for Healthcare Providers:  IncredibleEmployment.be  This test is no t yet approved or cleared by the [place] FDA and  has been authorized for detection and/or diagnosis of SARS-CoV-2 by FDA under an Emergency Use Authorization (EUA). This EUA will remain  in effect (meaning this test can be used) for the duration of the COVID-19 declaration under Section 564(b)(1) of the Act, 21 U.S.C.section 360bbb-3(b)(1), unless the authorization is terminated  or revoked sooner.       Influenza A by PCR NEGATIVE NEGATIVE Final   Influenza B by PCR NEGATIVE NEGATIVE Final    Comment: (NOTE) The Xpert Xpress SARS-CoV-2/FLU/RSV plus assay is intended as an aid in the diagnosis of influenza from Nasopharyngeal swab specimens and should not be used as a sole basis for treatment.  Nasal washings and aspirates are unacceptable for Xpert Xpress SARS-CoV-2/FLU/RSV testing.  Fact Sheet for Patients: EntrepreneurPulse.com.au  Fact Sheet for Healthcare Providers: IncredibleEmployment.be  This test is not yet approved or cleared by the [place] FDA and has been authorized for detection and/or diagnosis of SARS-CoV-2 by FDA under an Emergency Use Authorization (EUA). This EUA will remain in effect (meaning this test can be used) for the duration of the COVID-19 declaration under Section 564(b)(1) of the Act, 21 U.S.C. section 360bbb-3(b)(1), unless the  authorization is terminated or revoked.  Performed at [facility], [address], [place], [place] [address]     Radiology Reports MR Brain W Wo Contrast  Addendum Date: 05/10/2021   ADDENDUM REPORT: 05/10/2021 20:03 ADDENDUM: Not included in the impression, there is right posterior auricular soft tissue likely reflecting adenopathy. Electronically Signed   By: Macy Mis M.D.   On: 05/10/2021 20:03   Result Date: 05/10/2021 CLINICAL DATA:  Hematologic malignancy, surveillance EXAM: MRI HEAD WITHOUT AND WITH CONTRAST TECHNIQUE: Multiplanar, multiecho pulse sequences of the brain and surrounding structures were obtained without and with intravenous contrast. CONTRAST:  8.22mL GADAVIST GADOBUTROL 1 MMOL/ML IV SOLN COMPARISON:  None. FINDINGS: Brain: Several (approximately 5) enhancing lesions are identified likely involving brain parenchyma. Larger posterior fossa lesion measures 1.2 cm and is located within the midline cerebellar vermis along the roof of the fourth ventricle. Largest supratentorial lesion involves the anterior right frontal lobe and measures 1.1 cm. There is associated edema without significant mass effect. Minimal diffusion hyperintensity associated with right frontal lesions. No hemorrhage. There is no hydrocephalus or extra-axial fluid collection. Additional  patchy foci of T2 hyperintensity in the supratentorial white matter are nonspecific but may reflect chronic microvascular ischemic changes. Vascular: Major vessel flow voids at the skull base are preserved. Skull and upper cervical spine: Normal marrow signal is preserved. Sinuses/Orbits: Paranasal sinus mucosal thickening. Polypoid soft tissue in posterior left nasal cavity extending into the nasopharynx. Orbits are unremarkable. Other: Right posterior auricular soft tissue likely reflects adenopathy. Sella is unremarkable. Mastoid air cells are clear. IMPRESSION: Several small enhancing lesions with edema but no significant mass effect. 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 Likely involving brain parenchyma but given peripheral location, leptomeningeal involvement is not excluded. These results will be called to the ordering clinician or representative by the Radiologist Assistant, and communication documented in the PACS or [employer]. Electronically Signed: By: Macy Mis M.D. On: 05/10/2021 19:25   US Renal  Result Date: 06/02/2021 CLINICAL DATA:  Chronic kidney disease. EXAM: RENAL / URINARY TRACT ULTRASOUND COMPLETE COMPARISON:  October 02, 2019. FINDINGS: Right Kidney: Renal measurements: 12.4 x 5.8 x 5.8 cm = volume: 220 mL. Mild cortical thinning is noted. Increased echogenicity of renal parenchyma is noted. No mass or hydronephrosis visualized. Left Kidney: Renal measurements: 12.1 x 6.2 x 6.8 cm = volume: 357 mL. 5.3 cm simple cyst is noted. Increased echogenicity of renal parenchyma is noted. No mass or hydronephrosis visualized. Bladder: Appears normal for degree of bladder distention. Ureteral jets are not visualized. Other: None. IMPRESSION: Increased echogenicity of renal parenchyma is noted bilaterally consistent with medical renal disease. No definite hydronephrosis or renal obstruction is noted. Electronically Signed   By: Marijo Conception M.D.   On: 06/02/2021 09:52   NM  PET Image Restag (PS) Skull Base To Thigh  Result Date: 05/15/2021 CLINICAL DATA:  Subsequent treatment strategy for lymphoma. EXAM: NUCLEAR MEDICINE PET SKULL BASE TO THIGH TECHNIQUE: 9.23 mCi F-18 FDG was injected intravenously. Full-ring PET imaging was performed from the skull base to thigh after the radiotracer. CT data was obtained and used for attenuation correction and anatomic localization. Fasting blood glucose: 108 mg/dl COMPARISON:  06/23/2020 FINDINGS: Mediastinal blood pool activity: SUV max 2.48 Liver activity: SUV max 3.32 NECK: Right posterior auricular lesion measures 1.6 cm within SUV max 6.14, image 33/3. (Deauville criteria 4). Formally this measured 1.6 cm max of 11.7. The left posterior scalp lesion has an SUV max of 4.0, image  34/3. (Deauville criteria 4). Formally 10.39. FDG avid right level 2 lymph node measures 7 mm with SUV max of 5.5 (Deauville criteria 5). This is new compared with previous exam. Incidental CT findings: none CHEST: Measures 5.6 x 3.0 cm with SUV max 17.94, image 122/3 (Deauville criteria 4). Formally this measured 4.7 x 4.6 cm within SUV max of 16.8. No FDG avid supraclavicular, right axillary, mediastinal, or hilar lymph. No FDG avid pulmonary nodule or mass identified. Incidental CT findings: Mild aortic atherosclerosis with coronary artery calcifications. ABDOMEN/PELVIS: There are new small bilateral tracer avid adrenal nodules. On the left this measures 1.2 cm with SUV max of 6.1, image 254/7 on the right this measures 0.9 cm with SUV max of 6.80 (Deauville criteria 5). FDG avid left inguinal lymph node measures 0.8 cm within SUV max of 2.65 (Deauville criteria 3). Formally this measured 0.9 cm with SUV max 8.7. The spleen has a normal size without focal areas of increased uptake. Left kidney cyst is again noted. No abnormal uptake within the liver or pancreas. Incidental CT findings: Cholecystectomy. Left kidney cyst. Aortic atherosclerosis. SKELETON: Interval  improvement in multifocal FDG avid bone lesions, including: Index lesion within the right iliac bone has an SUV max of 2.63 (Deauville criteria 3.). Formally 11.59. Large tracer avid lesion involving the T11 vertebra has an SUV max of 1.62 on today's study (Deauville criteria 2). Formally 15.83. Persistent tracer avid tumor is identified within the medial wall of the right acetabulum within SUV max of 9.64 (Deauville criteria 4). This is similar to the previous exam. Incidental CT findings: none IMPRESSION: 1. Imaging findings on today's exam reflect a partial response to therapy. 2. Persistent Deauville criteria 4 disease is identified within the soft tissues of the neck and left axilla, and right acetabulum. New areas of increased uptake are noted within the right cervical nodal chain and bilateral adrenal glands (Deauville criteria 5). Electronically Signed   By: [name] Moors M.D.   On: 05/15/2021 14:22   DG Chest Port 1 View  Result Date: 06/01/2021 CLINICAL DATA:  Elevated potassium, leg weakness EXAM: PORTABLE CHEST 1 VIEW COMPARISON:  08/01/2020 FINDINGS: Lungs are clear.  No pleural effusion or pneumothorax. Heart is top-normal in size.  Prominent epicardial fat. IMPRESSION: No evidence of acute cardiopulmonary disease. Electronically Signed   By: Julian Hy M.D.   On: 06/01/2021 21:06    SIGNED: Deatra James, MD, FHM. [facility],  Pager (please use [email or web address] to page/text) Please use Epic Secure Chat for non-urgent communication (7AM-7PM)  If 7PM-7AM, please contact night-coverage [email or web address], 06/04/2021, 1:40 PM

## 2021-06-04 NOTE — Progress Notes (Signed)
Inpatient Diabetes Program Recommendations  AACE/ADA: New Consensus Statement on Inpatient Glycemic Control   Target Ranges:  Prepandial:   less than 140 mg/dL      Peak postprandial:   less than 180 mg/dL (1-2 hours)      Critically ill patients:  140 - 180 mg/dL   Results for [name], Darius Smith (MRN [id number]) as of 06/04/2021 13:23  Ref. Range 06/03/2021 14:39 06/03/2021 16:32 06/03/2021 21:41 06/04/2021 07:26 06/04/2021 11:06  Glucose-Capillary Latest Ref Range: 70 - 99 mg/dL 214 (H) 258 (H) 211 (H) 161 (H) 274 (H)    Review of Glycemic Control  Diabetes history: DM2 Outpatient Diabetes medications: Humalog 1-10 units QID Current orders for Inpatient glycemic control: Novolog 0-9 units TID; Decadron 2 mg BID  Inpatient Diabetes Program Recommendations:    Insulin: If steroids are continued, please consider ordering Novolog 3 units TID with meals for meal coverage if patient eats at least 50% of meals.  Thanks, Barnie Alderman, RN, MSN, CDE Diabetes Coordinator Inpatient Diabetes Program [phone] (Team Pager from 8am to 5pm)

## 2021-06-04 NOTE — Care Management Obs Status (Signed)
[facility] NOTIFICATION   Patient Details  Name: Darius Smith MRN: [id number] Date of Birth: May 23, 1946   Medicare Observation Status Notification Given:  Yes    Natasha Bence, LCSW 06/04/2021, 5:05 PM

## 2021-06-04 NOTE — Consult Note (Signed)
[facility] Consultation Oncology  Name: Darius Smith      MRN: [id number]    Location: A312/A312-01  Date: 06/04/2021 Time:8:39 PM   REFERRING PHYSICIAN: Dr. Roger Shelter  REASON FOR CONSULT: Stage IV non-Hodgkin's lymphoma    HISTORY OF PRESENT ILLNESS: Darius Smith is a 75 year old very pleasant white male who is seen in consultation today at the request of Dr. Roger Shelter for further work-up and management of advanced non-Hodgkin's lymphoma.  He was initially seen by [place] in May 2022 when he was found to have a metastatic lesions on his CT and MRI of the spine.  Initial PET scan on 06/23/2020 showed multiple small hypermetabolic lymph nodes in the neck, axillary region, small bowel mesentery, throughout the spine and pelvis involving both hips.  He underwent left axillary lymph node biopsy on 06/05/2020 which showed follicular lymphoma, morphologically low-grade.  He was subsequently followed with observation.  He recently developed balance issues and was treated for vertigo.  He subsequently called our office with headaches and balance symptoms.  An MRI of the brain showed at least 5 peripheral lesions with some edema.  He was then started on dexamethasone 2 mg twice daily which improved his symptoms.  We have ordered a PET CT scan which was done recently.  We have arranged for a repeat biopsy which was delayed secondary to hyperkalemia.  He was again admitted for hyperkalemia and optimization for surgery.  He is a never smoker and worked as a Furniture conservator/restorer and denied any exposure to chemicals.  Father died of prostate cancer and mother had breast cancer.  2 paternal aunts had breast cancer and one paternal uncle had lung cancer.  PAST MEDICAL HISTORY:   Past Medical History:  Diagnosis Date   Allergic rhinitis 07/02/2019   Arthritis    both hands   Atrial fibrillation ([place]) 10/28/2017   Dr. Harl Bowie   Atrial fibrillation with RVR ([place]) 07/14/2020   Blood in urine    occ   Bronchitis    Cancer  ([place])    Basal cell   Cardiomegaly 10/28/2017   CHF (congestive heart failure) ([place])    Complication of anesthesia    Mr. Gras developed a. fib in recovery after cysto procedure 10/2017 was transferred to [facility] and underwent successful cardioversion   Diverticulosis of sigmoid colon 08/2017   Noted on colonoscopy   Dysrhythmia    External hemorrhoids 08/2017   GERD (gastroesophageal reflux disease)    History of colon polyps 08/2017   History of kidney stones    History of rectal bleeding    History of right inguinal hernia    Hypertension    Lung cancer ([place]) 06/25/2020   Lymphoma ([place])    Nephrolithiasis 07/02/2019   Rectal bleeding 09/02/2017   Added automatically from request for surgery [id number]   Shortness of breath    Stage 3 chronic kidney disease ([place])     ALLERGIES: Allergies  Allergen Reactions   Augmentin [Amoxicillin-Pot Clavulanate] Other (See Comments)    Increased liver enzymes Has patient had a PCN reaction causing immediate rash, facial/tongue/throat swelling, SOB or lightheadedness with hypotension: No Has patient had a PCN reaction causing severe rash involving mucus membranes or skin necrosis: No Has patient had a PCN reaction that required hospitalization: No Has patient had a PCN reaction occurring within the last 10 years: No  If all of the above answers are "NO", then may proceed with Cephalosporin use.    Flomax [Tamsulosin Hcl] Nausea Only  Dizziness  Pt is on flomax      MEDICATIONS: I have reviewed the patient's current medications.     PAST SURGICAL HISTORY Past Surgical History:  Procedure Laterality Date   CARDIOVERSION     10/2017   CARDIOVERSION N/A 07/16/2020   Procedure: CARDIOVERSION;  Surgeon: Arnoldo Lenis, MD;  [facility] END[facility];  Service: Endoscopy;  Laterality: N/A;   CARDIOVERSION N/A 08/21/2020   Procedure: CARDIOVERSION;  Surgeon: Satira Sark, MD;  Location: [facility] ORS;  Service: Cardiovascular;  Laterality:  N/A;   CARDIOVERSION N/A 09/10/2020   Procedure: CARDIOVERSION;  Surgeon: Josue Hector, MD;  Location: [facility] ENDOSCOPY;  Service: Cardiovascular;  Laterality: N/A;   CHOLECYSTECTOMY     CLEFT LIP REPAIR     several from childhood til 75 years old   [place]     several from childhood til 75 years old   COLONOSCOPY  04/28/2011   Procedure: COLONOSCOPY;  Surgeon: Rogene Houston, MD;  Location[facility]O [facility];  Service: Endoscopy;  Laterality: N/A;   COLONOSCOPY N/A 07/18/2014   Procedure: COLONOSCOPY;  Surgeon: Rogene Houston, MD;  Location[facility];  Service: Endoscopy;  Laterality: N/A;  830   COLONOSCOPY N/A 09/09/2017   Procedure: COLONOSCOPY;  Surgeon: Rogene Houston, MD;  Location: [facility];  Service: Endoscopy;  Laterality: N/A;  955   CYSTOSCOPY/URETEROSCOPY/HOLMIUM LASER/STENT PLACEMENT Bilateral 12/06/2017   Procedure: CYSTOSCOPY/RETROGRADE/URETEROSCOPY/HOLMIUM LASER/STENT EXCHANGE;  Surgeon: Festus Aloe, MD;  Location: [facility] ORS;  Service: Urology;  Laterality: Bilateral;  ONLY NEEDS 60 MIN   HERNIA REPAIR     POLYPECTOMY  09/09/2017   Procedure: POLYPECTOMY;  Surgeon: Rogene Houston, MD;  Location: [facility];  Service: Endoscopy;;  colon   vocal cord surgery      FAMILY HISTORY: Family History  Problem Relation Age of Onset   Breast cancer Mother    Prostate cancer Father    COPD Sister     SOCIAL HISTORY:  reports that he has never smoked. He has never used smokeless tobacco. He reports that he does not currently use alcohol after a past usage of about 7.0 standard drinks per week. He reports that he does not use drugs.  PERFORMANCE STATUS: The patient's performance status is 1 - Symptomatic but completely ambulatory  PHYSICAL EXAM: Most Recent Vital Signs: Blood pressure 105/63, pulse 62, temperature (!) 97.4 F (36.3 C), temperature source Oral, resp. rate 20, height 6' (1.829 m), weight 177 lb 0.5 oz (80.3 kg), SpO2 100 %. BP 105/63  (BP Location: Left Arm)   Pulse 62   Temp (!) 97.4 F (36.3 C) (Oral)   Resp 20   Ht 6' (1.829 m)   Wt 177 lb 0.5 oz (80.3 kg)   SpO2 100%   BMI 24.01 kg/m  General appearance: alert, cooperative, and appears stated age Lungs: clear to auscultation bilaterally Heart: regular rate and rhythm Extremities:  No edema or cyanosis. Neurologic: Grossly normal  LABORATORY DATA:  Results for orders placed or performed during the hospital encounter of 06/03/21 (from the past 48 hour(s))  I-STAT, chem 8     Status: Abnormal   Collection Time: 06/03/21 10:26 AM  Result Value Ref Range   Sodium 132 (L) 135 - 145 mmol/L   Potassium 5.9 (H) 3.5 - 5.1 mmol/L   Chloride 102 98 - 111 mmol/L   BUN 46 (H) 8 - 23 mg/dL   Creatinine, Ser 1.70 (H) 0.61 - 1.24 mg/dL  Glucose, Bld 122 (H) 70 - 99 mg/dL    Comment: Glucose reference range applies only to samples taken after fasting for at least 8 hours.   Calcium, Ion 1.12 (L) 1.15 - 1.40 mmol/L   TCO2 21 (L) 22 - 32 mmol/L   Hemoglobin 15.0 13.0 - 17.0 g/dL   HCT 44.0 39.0 - 52.0 %  Glucose, capillary     Status: Abnormal   Collection Time: 06/03/21  2:39 PM  Result Value Ref Range   Glucose-Capillary 214 (H) 70 - 99 mg/dL    Comment: Glucose reference range applies only to samples taken after fasting for at least 8 hours.  Brain natriuretic peptide     Status: None   Collection Time: 06/03/21  3:25 PM  Result Value Ref Range   B Natriuretic Peptide 68.0 0.0 - 100.0 pg/mL    Comment: Performed at [facility], [address], [place], [place] [address]  CBC     Status: Abnormal   Collection Time: 06/03/21  3:25 PM  Result Value Ref Range   WBC 16.8 (H) 4.0 - 10.5 K/uL   RBC 5.00 4.22 - 5.81 MIL/uL   Hemoglobin 15.4 13.0 - 17.0 g/dL   HCT 45.3 39.0 - 52.0 %   MCV 90.6 80.0 - 100.0 fL   MCH 30.8 26.0 - 34.0 pg   MCHC 34.0 30.0 - 36.0 g/dL   RDW 12.3 11.5 - 15.5 %   Platelets 185 150 - 400 K/uL   nRBC 0.0 0.0 - 0.2 %    Comment: Performed  at [facility], [address], [place], [place] [address]  Creatinine, serum     Status: Abnormal   Collection Time: 06/03/21  3:25 PM  Result Value Ref Range   Creatinine, Ser 1.70 (H) 0.61 - 1.24 mg/dL   GFR, Estimated 42 (L) >60 mL/min    Comment: (NOTE) Calculated using the CKD-EPI Creatinine Equation (2021) Performed at [facility], [address] [place], [place] [address]   Glucose, capillary     Status: Abnormal   Collection Time: 06/03/21  4:32 PM  Result Value Ref Range   Glucose-Capillary 258 (H) 70 - 99 mg/dL    Comment: Glucose reference range applies only to samples taken after fasting for at least 8 hours.  Glucose, capillary     Status: Abnormal   Collection Time: 06/03/21  9:41 PM  Result Value Ref Range   Glucose-Capillary 211 (H) 70 - 99 mg/dL    Comment: Glucose reference range applies only to samples taken after fasting for at least 8 hours.  Basic metabolic panel     Status: Abnormal   Collection Time: 06/04/21  5:10 AM  Result Value Ref Range   Sodium 132 (L) 135 - 145 mmol/L   Potassium 4.6 3.5 - 5.1 mmol/L    Comment: DELTA CHECK NOTED   Chloride 102 98 - 111 mmol/L   CO2 24 22 - 32 mmol/L   Glucose, Bld 160 (H) 70 - 99 mg/dL    Comment: Glucose reference range applies only to samples taken after fasting for at least 8 hours.   BUN 49 (H) 8 - 23 mg/dL   Creatinine, Ser 1.59 (H) 0.61 - 1.24 mg/dL   Calcium 7.9 (L) 8.9 - 10.3 mg/dL   GFR, Estimated 45 (L) >60 mL/min    Comment: (NOTE) Calculated using the CKD-EPI Creatinine Equation (2021)    Anion gap 6 5 - 15    Comment: Performed at [facility],  [address], [place], [place] [address]  CBC     Status: Abnormal   Collection Time: 06/04/21  5:10 AM  Result Value Ref Range   WBC 11.4 (H) 4.0 - 10.5 K/uL   RBC 3.96 (L) 4.22 - 5.81 MIL/uL   Hemoglobin 12.3 (L) 13.0 - 17.0 g/dL   HCT 36.4 (L) 39.0 - 52.0 %   MCV 91.9 80.0 - 100.0 fL   MCH 31.1 26.0 - 34.0 pg   MCHC 33.8 30.0 - 36.0 g/dL   RDW  12.2 11.5 - 15.5 %   Platelets 137 (L) 150 - 400 K/uL   nRBC 0.0 0.0 - 0.2 %    Comment: Performed at [facility], [address] [place], [place] [address]  Protime-INR     Status: None   Collection Time: 06/04/21  5:10 AM  Result Value Ref Range   Prothrombin Time 14.4 11.4 - 15.2 seconds   INR 1.1 0.8 - 1.2    Comment: (NOTE) INR goal varies based on device and disease states. Performed at [facility], [address] [place], [place] [address]   Glucose, capillary     Status: Abnormal   Collection Time: 06/04/21  7:26 AM  Result Value Ref Range   Glucose-Capillary 161 (H) 70 - 99 mg/dL    Comment: Glucose reference range applies only to samples taken after fasting for at least 8 hours.  Glucose, capillary     Status: Abnormal   Collection Time: 06/04/21 11:06 AM  Result Value Ref Range   Glucose-Capillary 274 (H) 70 - 99 mg/dL    Comment: Glucose reference range applies only to samples taken after fasting for at least 8 hours.  Glucose, capillary     Status: Abnormal   Collection Time: 06/04/21  4:44 PM  Result Value Ref Range   Glucose-Capillary 335 (H) 70 - 99 mg/dL    Comment: Glucose reference range applies only to samples taken after fasting for at least 8 hours.      RADIOGRAPHY: No results found.     ASSESSMENT:  1.  Stage IVa follicular lymphoma: - Left axillary lymph node biopsy on 06/05/2020 consistent with follicular lymphoma, morphologically low-grade is favored although focally elevated Ki-67. - PET scan on 06/23/2020 showed multiple small hypermetabolic lymph nodes in the neck, maximum SUV 11.4.  Left axillary adenopathy 4.7 x 4.6 with SUV 16.8.  Few subcentimeter lymph nodes in the small bowel mesentery SUV 4.7.  Mild hypermetabolic lymphadenopathy in both inguinal regions and SUV 8.8.  Multiple hypermetabolic bone lesions throughout the spine and pelvis involving both hips.  PLAN:  1.  Stage IVa follicular lymphoma: - I have reviewed MRI of the brain  results which showed several small enhancing lesions with edema and no significant mass-effect. - PET scan from 05/14/2021 showed persistent lymphadenopathy in the neck, left axilla, right acetabulum.  New areas of increased uptake are noted within the right cervical nodal chain and bilateral adrenal glands.  Interval improvement in multifocal bone lesions and some other lymph nodes likely steroid effect. - He is planned to undergo left axillary node biopsy tomorrow. - If his general medical condition is stable, he may be discharged home after the biopsy. - He will follow-up with me in the office next week.  2.  Cerebral edema: - He had mild edema on the brain MRI for which she was started on dexamethasone 2 mg twice daily. - He is having some weakness in the proximal muscle groups.  He reportedly  fell couple of times. - We will cut back on dexamethasone to 2 mg once daily.  Discussed with wife.  3.  Hyperkalemia: - Potassium today has normalized 4.6.  He will proceed with biopsy. - He will hold off on taking home potassium supplements.  All questions were answered. The patient knows to call the clinic with any problems, questions or concerns. We can certainly see the patient much sooner if necessary.    Derek Jack

## 2021-06-04 NOTE — Evaluation (Signed)
Physical Therapy Evaluation Patient Details Name: Darius Smith MRN: [id number] DOB: 1946/05/13 Today's Date: 06/04/2021  History of Present Illness  Darius Smith is a 75 y.o. 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  Was recently admitted for hyperkalemia, hyperglycemia, a on CKD polyuria, polydipsia... Patient was discharged yesterday... With plan which to present today 06/03/2021 for left axillary lymph node biopsy by Dr. Arnoldo Morale   Clinical Impression  Patient agreeable to PT evaluation today. Patient performed well but exhibited decreased balance during ambulation with RW and reported two recent falls without injury. Patient required min assist for supine to sit with HOB elevated. For safety patient required min guard to supervision for transfers and ambulation with RW and cues for sequencing of steps and placement of hands in use of RW for safety and decrease his risk of falls.  Patient would continue to benefit from skilled physical therapy in current environment and next venue to continue return to prior function and increase strength, endurance, balance, coordination, and functional mobility and gait skills.         Recommendations for follow up therapy are one component of a multi-disciplinary discharge planning process, led by the attending physician.  Recommendations may be updated based on patient status, additional functional criteria and insurance authorization.  Follow Up Recommendations Outpatient PT;Supervision for mobility/OOB    Equipment Recommendations  None recommended by PT    Recommendations for Other Services       Precautions / Restrictions Precautions Precautions: Fall Precaution Comments: patient reports 2 recent falls without injury Restrictions Weight Bearing Restrictions: No      Mobility  Bed Mobility Overal bed mobility: Needs  Assistance Bed Mobility: Supine to Sit     Supine to sit: Min assist;HOB elevated     General bed mobility comments: min assist for trunk    Transfers Overall transfer level: Needs assistance Equipment used: Rolling walker (2 wheeled) Transfers: Sit to/from [facility] Sit to Stand: Min guard;Supervision Stand pivot transfers: Min guard;Supervision       General transfer comment: cues for sequencing of steps and placement of hands in use of RW for safer stand to sit and stand pivot transfers; min guard for safety  Ambulation/Gait Ambulation/Gait assistance: Min guard;Supervision Gait Distance (Feet): 300 Feet Assistive device: Rolling walker (2 wheeled) Gait Pattern/deviations: Step-through pattern;Decreased step length - right;Decreased step length - left;Decreased stride length;Wide base of support Gait velocity: decreased   General Gait Details: somewhat slow and labored cadence with a few unsteady steps using RW; limited by fatigue; on room air  Stairs            Wheelchair Mobility    Modified Rankin (Stroke Patients Only)       Balance Overall balance assessment: Mild deficits observed, not formally tested;History of Falls                                           Pertinent Vitals/Pain Pain Assessment: No/denies pain    Home Living Family/patient expects to be discharged to:: Private residence Living Arrangements: Spouse/significant other Available Help at Discharge: Family;Available 24 hours/day Type of Home: House Home Access: Ramped entrance     Home Layout: One level Home Equipment: Walker - 4 wheels;Bedside commode;Shower seat;Hand held shower head  Prior Function Level of Independence: Independent with assistive device(s)         Comments: reports daily exercises and walking 500 steps per day; in July 2022 was walking 4-5 miles per day     Hand Dominance        Extremity/Trunk Assessment    Upper Extremity Assessment Upper Extremity Assessment: Overall WFL for tasks assessed    Lower Extremity Assessment Lower Extremity Assessment: Overall WFL for tasks assessed    Cervical / Trunk Assessment Cervical / Trunk Assessment: Normal  Communication   Communication: No difficulties  Cognition Arousal/Alertness: Awake/alert Behavior During Therapy: WFL for tasks assessed/performed Overall Cognitive Status: Within Functional Limits for tasks assessed                                        General Comments      Exercises     Assessment/Plan    PT Assessment Patient needs continued PT services  PT Problem List Decreased mobility;Decreased activity tolerance;Decreased balance;Decreased knowledge of use of DME       PT Treatment Interventions DME instruction;Therapeutic exercise;Gait training;Balance training;Neuromuscular re-education;Functional mobility training;Therapeutic activities;Patient/family education    PT Goals (Current goals can be found in the Care Plan section)  Acute Rehab PT Goals Patient Stated Goal: to outpatient PT to get better balance back. PT Goal Formulation: With patient Time For Goal Achievement: 06/18/21 Potential to Achieve Goals: Good    Frequency Min 3X/week   Barriers to discharge           AM-PAC PT "6 Clicks" Mobility  Outcome Measure Help needed turning from your back to your side while in a flat bed without using bedrails?: None Help needed moving from lying on your back to sitting on the side of a flat bed without using bedrails?: A Little Help needed moving to and from a bed to a chair (including a wheelchair)?: A Little Help needed standing up from a chair using your arms (e.g., wheelchair or bedside chair)?: A Little Help needed to walk in hospital room?: A Little Help needed climbing 3-5 steps with a railing? : A Little 6 Click Score: 19    End of Session Equipment Utilized During Treatment: Gait  belt Activity Tolerance: Patient tolerated treatment well;Patient limited by fatigue Patient left: in chair;with call bell/phone within reach Nurse Communication: Mobility status PT Visit Diagnosis: Unsteadiness on feet (R26.81);Repeated falls (R29.6)    Time: 2094-7096 PT Time Calculation (min) (ACUTE ONLY): 25 min   Charges:   PT Evaluation $PT Eval Low Complexity: 1 Low PT Treatments $Therapeutic Activity: 8-22 mins        Floria Raveling. Hartnett-Rands, MS, PT Per [place] [phone]  Pamala Hurry  Hartnett-Rands 06/04/2021, 9:19 AM

## 2021-06-04 NOTE — Plan of Care (Signed)
  Problem: Acute Rehab PT Goals(only PT should resolve) Goal: Pt Will Go Supine/Side To Sit Outcome: Progressing Flowsheets (Taken 06/04/2021 0922) Pt will go Supine/Side to Sit: with supervision Goal: Pt Will Go Sit To Supine/Side Outcome: Progressing Flowsheets (Taken 06/04/2021 0922) Pt will go Sit to Supine/Side: with supervision Goal: Patient Will Transfer Sit To/From Stand Outcome: Progressing Flowsheets (Taken 06/04/2021 4656) Patient will transfer sit to/from stand: with supervision Goal: Pt Will Transfer Bed To Chair/Chair To Bed Outcome: Progressing Flowsheets (Taken 06/04/2021 0922) Pt will Transfer Bed to Chair/Chair to Bed: with supervision Goal: Pt Will Ambulate Outcome: Progressing Flowsheets (Taken 06/04/2021 0922) Pt will Ambulate:  > 125 feet  with supervision  with rolling walker Goal: Pt/caregiver will Perform Home Exercise Program Outcome: Progressing Flowsheets (Taken 06/04/2021 0922) Pt/caregiver will Perform Home Exercise Program:  For increased strengthening  For improved balance  With Supervision, verbal cues required/provided   Floria Raveling. Hartnett-Rands, MS, PT Per [place] [phone] 06/04/2021

## 2021-06-04 NOTE — Progress Notes (Signed)
Patient seen.  Potassium now 4.6.  We will discuss with Dr. Constance Haw timing of left axillary lymph node biopsy for tomorrow as I will be out of town.  Patient aware.

## 2021-06-05 ENCOUNTER — Encounter (HOSPITAL_COMMUNITY)
Admission: AD | Disposition: A | Discharge status: Home / Self Care | Payer: Self-pay | Source: Ambulatory Visit | Attending: Family Medicine

## 2021-06-05 ENCOUNTER — Other Ambulatory Visit: Payer: Self-pay

## 2021-06-05 ENCOUNTER — Encounter (HOSPITAL_COMMUNITY): Payer: Self-pay | Admitting: Family Medicine

## 2021-06-05 ENCOUNTER — Observation Stay (HOSPITAL_COMMUNITY): Payer: Medicare Other | Admitting: Anesthesiology

## 2021-06-05 DIAGNOSIS — R590 Localized enlarged lymph nodes: Secondary | ICD-10-CM | POA: Diagnosis not present

## 2021-06-05 DIAGNOSIS — C7931 Secondary malignant neoplasm of brain: Secondary | ICD-10-CM | POA: Diagnosis not present

## 2021-06-05 DIAGNOSIS — E0865 Diabetes mellitus due to underlying condition with hyperglycemia: Secondary | ICD-10-CM

## 2021-06-05 DIAGNOSIS — R0602 Shortness of breath: Secondary | ICD-10-CM | POA: Diagnosis not present

## 2021-06-05 DIAGNOSIS — I4891 Unspecified atrial fibrillation: Secondary | ICD-10-CM | POA: Diagnosis not present

## 2021-06-05 DIAGNOSIS — E875 Hyperkalemia: Secondary | ICD-10-CM | POA: Diagnosis not present

## 2021-06-05 DIAGNOSIS — I129 Hypertensive chronic kidney disease with stage 1 through stage 4 chronic kidney disease, or unspecified chronic kidney disease: Secondary | ICD-10-CM | POA: Diagnosis not present

## 2021-06-05 DIAGNOSIS — C8334 Diffuse large B-cell lymphoma, lymph nodes of axilla and upper limb: Secondary | ICD-10-CM | POA: Diagnosis not present

## 2021-06-05 DIAGNOSIS — C8590 Non-Hodgkin lymphoma, unspecified, unspecified site: Secondary | ICD-10-CM | POA: Diagnosis not present

## 2021-06-05 DIAGNOSIS — I5022 Chronic systolic (congestive) heart failure: Secondary | ICD-10-CM | POA: Diagnosis not present

## 2021-06-05 DIAGNOSIS — Z794 Long term (current) use of insulin: Secondary | ICD-10-CM | POA: Diagnosis not present

## 2021-06-05 DIAGNOSIS — Z23 Encounter for immunization: Secondary | ICD-10-CM | POA: Diagnosis not present

## 2021-06-05 DIAGNOSIS — N1832 Chronic kidney disease, stage 3b: Secondary | ICD-10-CM | POA: Diagnosis not present

## 2021-06-05 DIAGNOSIS — Z7901 Long term (current) use of anticoagulants: Secondary | ICD-10-CM | POA: Diagnosis not present

## 2021-06-05 DIAGNOSIS — Z79899 Other long term (current) drug therapy: Secondary | ICD-10-CM | POA: Diagnosis not present

## 2021-06-05 DIAGNOSIS — Z85828 Personal history of other malignant neoplasm of skin: Secondary | ICD-10-CM | POA: Diagnosis not present

## 2021-06-05 HISTORY — PX: AXILLARY LYMPH NODE BIOPSY: SHX5737

## 2021-06-05 LAB — CBC
HCT: 34.8 % — ABNORMAL LOW (ref 39.0–52.0)
Hemoglobin: 12.1 g/dL — ABNORMAL LOW (ref 13.0–17.0)
MCH: 31.8 pg (ref 26.0–34.0)
MCHC: 34.8 g/dL (ref 30.0–36.0)
MCV: 91.3 fL (ref 80.0–100.0)
Platelets: 128 10*3/uL — ABNORMAL LOW (ref 150–400)
RBC: 3.81 MIL/uL — ABNORMAL LOW (ref 4.22–5.81)
RDW: 12.1 % (ref 11.5–15.5)
WBC: 14.5 10*3/uL — ABNORMAL HIGH (ref 4.0–10.5)
nRBC: 0 % (ref 0.0–0.2)

## 2021-06-05 LAB — GLUCOSE, CAPILLARY
Glucose-Capillary: 169 mg/dL — ABNORMAL HIGH (ref 70–99)
Glucose-Capillary: 158 mg/dL — ABNORMAL HIGH (ref 70–99)
Glucose-Capillary: 195 mg/dL — ABNORMAL HIGH (ref 70–99)
Glucose-Capillary: 227 mg/dL — ABNORMAL HIGH (ref 70–99)

## 2021-06-05 LAB — BASIC METABOLIC PANEL
Anion gap: 5 (ref 5–15)
BUN: 47 mg/dL — ABNORMAL HIGH (ref 8–23)
CO2: 23 mmol/L (ref 22–32)
Calcium: 7.7 mg/dL — ABNORMAL LOW (ref 8.9–10.3)
Chloride: 104 mmol/L (ref 98–111)
Creatinine, Ser: 1.5 mg/dL — ABNORMAL HIGH (ref 0.61–1.24)
GFR, Estimated: 48 mL/min — ABNORMAL LOW (ref >60)
Glucose, Bld: 172 mg/dL — ABNORMAL HIGH (ref 70–99)
Potassium: 4.3 mmol/L (ref 3.5–5.1)
Sodium: 132 mmol/L — ABNORMAL LOW (ref 135–145)

## 2021-06-05 SURGERY — AXILLARY LYMPH NODE BIOPSY
Anesthesia: General | Site: Axilla | Laterality: Left

## 2021-06-05 MED ORDER — ONDANSETRON HCL 4 MG/2ML IJ SOLN
INTRAMUSCULAR | Status: DC | PRN
Start: 2021-06-05 — End: 2021-06-05
  Administered 2021-06-05: 4 mg via INTRAVENOUS

## 2021-06-05 MED ORDER — CHLORHEXIDINE GLUCONATE CLOTH 2 % EX PADS: 6.0000 | MEDICATED_PAD | Freq: Once | CUTANEOUS | Status: DC

## 2021-06-05 MED ORDER — ORAL CARE MOUTH RINSE: 15.0000 mL | Freq: Once | OROMUCOSAL | Status: DC

## 2021-06-05 MED ORDER — BUPIVACAINE HCL (PF) 0.5 % IJ SOLN
INTRAMUSCULAR | Status: DC | PRN
  Administered 2021-06-05: 10 mL

## 2021-06-05 MED ORDER — FENTANYL CITRATE (PF) 100 MCG/2ML IJ SOLN
INTRAMUSCULAR | Status: AC
  Filled 2021-06-05: qty 2

## 2021-06-05 MED ORDER — PROPOFOL 10 MG/ML IV BOLUS
INTRAVENOUS | Status: DC | PRN
  Administered 2021-06-05: 150 mg via INTRAVENOUS

## 2021-06-05 MED ORDER — FENTANYL CITRATE (PF) 100 MCG/2ML IJ SOLN
INTRAMUSCULAR | Status: DC | PRN
  Administered 2021-06-05 (×2): 25 ug via INTRAVENOUS

## 2021-06-05 MED ORDER — LIDOCAINE HCL (CARDIAC) PF 100 MG/5ML IV SOSY
PREFILLED_SYRINGE | INTRAVENOUS | Status: DC | PRN
  Administered 2021-06-05: 100 mg via INTRAVENOUS

## 2021-06-05 MED ORDER — CHLORHEXIDINE GLUCONATE 0.12 % MT SOLN: 15.0000 mL | Freq: Once | OROMUCOSAL | Status: DC

## 2021-06-05 MED ORDER — ONDANSETRON HCL 4 MG/2ML IJ SOLN
INTRAMUSCULAR | Status: AC
  Filled 2021-06-05: qty 2

## 2021-06-05 MED ORDER — PROPOFOL 10 MG/ML IV BOLUS
INTRAVENOUS | Status: AC
  Filled 2021-06-05: qty 20

## 2021-06-05 MED ORDER — LACTATED RINGERS IV SOLN: INTRAVENOUS | Status: DC | PRN

## 2021-06-05 MED ORDER — EPHEDRINE 5 MG/ML INJ
INTRAVENOUS | Status: AC
  Filled 2021-06-05: qty 10

## 2021-06-05 MED ORDER — VANCOMYCIN HCL IN DEXTROSE 1-5 GM/200ML-% IV SOLN: 1000.0000 mg | INTRAVENOUS | Status: DC

## 2021-06-05 MED ORDER — 0.9 % SODIUM CHLORIDE (POUR BTL) OPTIME
TOPICAL | Status: DC | PRN
  Administered 2021-06-05: 1000 mL

## 2021-06-05 MED ORDER — VANCOMYCIN HCL IN DEXTROSE 1-5 GM/200ML-% IV SOLN
1000.0000 mg | INTRAVENOUS | Status: AC
  Administered 2021-06-05: 1000 mg via INTRAVENOUS
  Filled 2021-06-05: qty 200

## 2021-06-05 MED ORDER — SODIUM POLYSTYRENE SULFONATE 15 GM/60ML PO SUSP: 15.0000 g | Freq: Two times a day (BID) | ORAL | 0 refills | Status: DC | PRN

## 2021-06-05 MED ORDER — LIDOCAINE HCL (PF) 2 % IJ SOLN
INTRAMUSCULAR | Status: AC
  Filled 2021-06-05: qty 5

## 2021-06-05 MED ORDER — BUPIVACAINE HCL (PF) 0.5 % IJ SOLN
INTRAMUSCULAR | Status: AC
  Filled 2021-06-05: qty 30

## 2021-06-05 MED ORDER — OXYCODONE HCL 5 MG PO TABS: 5.0000 mg | ORAL_TABLET | Freq: Three times a day (TID) | ORAL | 0 refills | Status: AC | PRN

## 2021-06-05 MED ORDER — EPHEDRINE SULFATE 50 MG/ML IJ SOLN
INTRAMUSCULAR | Status: DC | PRN
  Administered 2021-06-05: 10 mg via INTRAVENOUS

## 2021-06-05 SURGICAL SUPPLY — 39 items
ADH SKN CLS APL DERMABOND .7 (GAUZE/BANDAGES/DRESSINGS) ×1
APL PRP STRL LF DISP 70% ISPRP (MISCELLANEOUS) ×1
APPLIER CLIP 11 MED OPEN (CLIP) ×4
APPLIER CLIP 9.375 SM OPEN (CLIP)
APR CLP MED 11 20 MLT OPN (CLIP) ×2
APR CLP SM 9.3 20 MLT OPN (CLIP)
BLADE SURG 15 STRL LF DISP TIS (BLADE) ×1 IMPLANT
BLADE SURG 15 STRL SS (BLADE) ×2
CHLORAPREP W/TINT 26 (MISCELLANEOUS) ×2 IMPLANT
CLIP APPLIE 11 MED OPEN (CLIP) IMPLANT
CLIP APPLIE 9.375 SM OPEN (CLIP) IMPLANT
CLOTH BEACON ORANGE TIMEOUT ST (SAFETY) ×2 IMPLANT
CNTNR URN SCR LID CUP LEK RST (MISCELLANEOUS) ×1 IMPLANT
CONT SPEC 4OZ STRL OR WHT (MISCELLANEOUS) ×2
COVER LIGHT HANDLE STERIS (MISCELLANEOUS) ×4 IMPLANT
DECANTER SPIKE VIAL GLASS SM (MISCELLANEOUS) ×1 IMPLANT
DERMABOND ADVANCED (GAUZE/BANDAGES/DRESSINGS) ×1
DERMABOND ADVANCED .7 DNX12 (GAUZE/BANDAGES/DRESSINGS) ×1 IMPLANT
ELECT REM PT RETURN 9FT ADLT (ELECTROSURGICAL) ×2
ELECTRODE REM PT RTRN 9FT ADLT (ELECTROSURGICAL) ×1 IMPLANT
GLOVE SURG ENC MOIS LTX SZ6.5 (GLOVE) ×2 IMPLANT
GLOVE SURG UNDER POLY LF SZ6.5 (GLOVE) ×2 IMPLANT
GLOVE SURG UNDER POLY LF SZ7 (GLOVE) ×4 IMPLANT
GOWN STRL REUS W/TWL LRG LVL3 (GOWN DISPOSABLE) ×4 IMPLANT
INST SET MINOR GENERAL (KITS) ×2 IMPLANT
KIT TURNOVER KIT A (KITS) ×2 IMPLANT
MANIFOLD NEPTUNE II (INSTRUMENTS) ×2 IMPLANT
NDL HYPO 25X1 1.5 SAFETY (NEEDLE) ×1 IMPLANT
NEEDLE HYPO 25X1 1.5 SAFETY (NEEDLE) ×2 IMPLANT
NS IRRIG 1000ML POUR BTL (IV SOLUTION) ×2 IMPLANT
PACK MINOR (CUSTOM PROCEDURE TRAY) ×2 IMPLANT
PAD ARMBOARD 7.5X6 YLW CONV (MISCELLANEOUS) ×2 IMPLANT
SET BASIN LINEN APH (SET/KITS/TRAYS/PACK) ×2 IMPLANT
SPONGE INTESTINAL PEANUT (DISPOSABLE) IMPLANT
SPONGE T-LAP 18X18 ~~LOC~~+RFID (SPONGE) ×2 IMPLANT
SUT MNCRL AB 4-0 PS2 18 (SUTURE) ×2 IMPLANT
SUT VIC AB 3-0 SH 27 (SUTURE) ×2
SUT VIC AB 3-0 SH 27X BRD (SUTURE) ×1 IMPLANT
SYR CONTROL 10ML LL (SYRINGE) ×2 IMPLANT

## 2021-06-05 NOTE — Anesthesia Procedure Notes (Signed)
Procedure Name: LMA Insertion Date/Time: 06/05/2021 8:59 AM Performed by: Riki Sheer, CRNA Pre-anesthesia Checklist: Patient identified, Emergency Drugs available, Suction available, Patient being monitored and Timeout performed Patient Re-evaluated:Patient Re-evaluated prior to induction Oxygen Delivery Method: Circle system utilized Preoxygenation: Pre-oxygenation with 100% oxygen Induction Type: IV induction LMA: LMA inserted LMA Size: 4.0 Number of attempts: 1 Placement Confirmation: positive ETCO2, CO2 detector and breath sounds checked- equal and bilateral Tube secured with: Tape Dental Injury: Teeth and Oropharynx as per pre-operative assessment

## 2021-06-05 NOTE — Progress Notes (Signed)
[facility]  Updated wife and team. Plan to dc home today. Dc instructions put in for surgery. Will see to check wound 9/29.  Curlene Labrum, MD [facility] [address] [place], [place] [address] [phone] (office)

## 2021-06-05 NOTE — Anesthesia Preprocedure Evaluation (Signed)
Anesthesia Evaluation  Patient identified by MRN, date of birth, ID band Patient awake    Reviewed: Allergy & Precautions, NPO status , Patient's Chart, lab work & pertinent test results, reviewed documented beta blocker date and time   History of Anesthesia Complications (+) history of anesthetic complications  Airway Mallampati: II  TM Distance: <3 FB Neck ROM: Full   Comment: Vocal cord sx, Cleft palate and cleft lip sx Dental  (+) Dental Advisory Given, Missing   Pulmonary shortness of breath and with exertion,  Lung cancer   Pulmonary exam normal breath sounds clear to auscultation       Cardiovascular Exercise Tolerance: Good hypertension, Pt. on medications and Pt. on home beta blockers +CHF  Normal cardiovascular exam+ dysrhythmias (post anesthesia A fib) Atrial Fibrillation  Rhythm:Regular Rate:Normal  1. Left ventricular ejection fraction, by estimation, is 50%. The left ventricle has mildly decreased function. The left ventricle has no regional wall motion abnormalities. The left ventricular internal cavity size was mildly dilated. Left ventricular diastolic parameters are consistent with Grade I diastolic dysfunction (impaired relaxation). The average left ventricular global longitudinal  strain is -18.2 %. The global longitudinal strain is normal.  2. Right ventricular systolic function is normal. The right ventricular size is normal.  3. Left atrial size was severely dilated.  4. Right atrial size was mildly dilated.  5. The mitral valve is normal in structure. No evidence of mitral valve regurgitation. No evidence of mitral stenosis.  6. The aortic valve was not well visualized. There is mild calcification of the aortic valve. There is mild thickening of the aortic valve. Aortic valve regurgitation is mild. Mild aortic valve stenosis.  7. 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 Hyperkalemia resolved   Reproductive/Obstetrics negative OB ROS                           Anesthesia Physical Anesthesia Plan  ASA: 3  Anesthesia Plan: General   Post-op Pain Management:    Induction: Intravenous  PONV Risk Score and Plan: 3  Airway Management Planned: LMA  Additional Equipment:   Intra-op Plan:   Post-operative Plan: Extubation in OR  Informed Consent: I have reviewed the patients History and Physical, chart, labs and discussed the procedure including the risks, benefits and alternatives for the proposed anesthesia with the patient or authorized representative who has indicated his/her understanding and acceptance.     Dental advisory given  Plan Discussed with: CRNA and Surgeon  Anesthesia Plan Comments:         Anesthesia Quick Evaluation

## 2021-06-05 NOTE — Discharge Summary (Signed)
Physician Discharge Summary [facility]    Patient: Darius Smith                   Admit date: 06/03/2021   DOB: 08-23-1946             Discharge date:06/05/2021/10:49 AM VEL:[id number]                          PCP: [name], MD  Disposition: HOME  Recommendations for Outpatient Follow-up:   Follow up: With your oncologist within 1 week. Continue discussion with the oncologist regarding discontinuation or reducing dose of his steroids Your blood sugars might improve may not need insulin once off steroids Follow-up with general surgery Dr. Constance Haw Further instruction regarding wound care from a site of biopsy  Discharge Condition: Stable   Code Status:   Code Status: Full Code  Diet recommendation: Cardiac diet   Discharge Diagnoses:    Principal Problem:   Hyperkalemia Active Problems:   Essential hypertension   GERD (gastroesophageal reflux disease)   Atrial fibrillation ([place])   Follicular lymphoma ([place])   Chronic systolic HF (heart failure) ([place])   Stage 3b chronic kidney disease ([place])   Acute on chronic renal failure ([place])   DNR (do not resuscitate) discussion   Brain metastases ([place])   History of Present Illness/ Hospital Course Darius Smith Summary:    Darius Smith is a 75 y.o. 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 Was recently admitted for hyperkalemia, hyperglycemia, a on CKD polyuria, polydipsia... Patient was discharged yesterday... With plan which to present today 06/03/2021 for left axillary lymph node biopsy by Dr. Arnoldo Morale   Preop labs once again revealed potassium of 5.9 Patient was discharged on medication Lokelma 5 mg daily which needed preauthorization insurance did not authorize for medication..      Hyperkalemia - Serum potassium 5.6, 5.9  >>> 4.6 -Remained asymptomatic, stable, no significant changes in EKG    -Patient was discharged on medication Lokelma 5 mg daily which needed preauthorization insurance did not authorize for medication..   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 Delton Coombes primary oncologist following as an outpatient -General surgery Dr. Arnoldo Morale following for left axillary lymph node biopsy -Status post  left axillary biopsy on 06/05/2021 -General surgery Dr. Constance Haw   Patient is to resume Eliquis on Monday, 06/09/2019     Diabetes mellitus type 2, with hyperglycemia  -Patient is on Decadron 4 lymphoma with brain mets, anticipating hyperglycemia -Continuing with SSI coverage,  -Prior difficulty with long-acting insulin due to hyperglycemic state, fluctuating p.o. intake -Hemoglobin A1c 6.5         Active Problems:   Essential hypertension  -Stable -Resumed home medication including metoprolol, Norvasc, hydralazine   GERD (gastroesophageal reflux disease) -resuming PPI, stable    Atrial fibrillation (HCC)  -Stable -Eliquis resumed on 06/08/2021 -Continue home medication of amiodarone, Toprol       Chronic systolic HF (heart failure) (HCC)  -S/P gentle IV fluid hydration for 12 hours, then resuming diuretics, monitoring daily weight, I's and O's   Acute on chronic -stage 3b chronic kidney disease (HCC)  -Creatinine 1.70 >> 1.59   -elevated creatinine, S/P gentle IV fluid hydration  DNR (do not resuscitate) discussion -confirmed by patient family, will need palliative care follow-up         Cultures:  -None  Antimicrobial: -None    Consults called:  Gen. Surgery  -------------------------------------------------------------------------------------------------------------------------------------------- DVT prophylaxis: Heparin SQ Code Status:   Code Status: Full Code     Admission status: Patient will be admitted as  Inpatient, with a greater than 2 midnight length of stay. Level of care: Telemetry     Family Communication:  none at bedside  (The above findings and plan of care has been discussed with patient in detail, the patient expressed understanding and agreement of above plan)  --------------------------------------------------------------------------------------------------------------------------------------------------   Disposition Plan: >3 days Status is: Inpatient   Not inpatient appropriate, will call UM team and downgrade to OBS.    Dispo: The patient is from: Home              Anticipated d/c is to: Home     Discharge Instructions:   Discharge Instructions     Activity as tolerated - No restrictions   Complete by: As directed    Ambulatory referral to Physical Therapy   Complete by: As directed    Diet - low sodium heart healthy   Complete by: As directed    Discharge instructions   Complete by: As directed    Follow-up with general surgery Dr. Constance Haw next week Resume Eliquis on Monday, 06/08/2021 Continue wound dressing per surgery instructions Follow-up with your oncologist next week....   Discharge wound care:   Complete by: As directed    Per general surgery instructions   Increase activity slowly   Complete by: As directed         Medication List     TAKE these medications    acetaminophen 500 MG tablet Commonly known as: TYLENOL Take 1,000 mg by mouth every 6 (six) hours as needed for moderate pain or headache.   amiodarone 200 MG tablet Commonly known as: PACERONE Take 1 tablet (200 mg total) by mouth daily.   amLODipine 10 MG tablet Commonly known as: NORVASC Take 1 tablet (10 mg total) by mouth daily.   blood glucose meter kit and supplies Kit Inject 1 each into the skin 4 (four) times daily -  before meals and at bedtime. Dispense based on patient and insurance preference. Use up to four times daily as directed.   cetirizine 10 MG  tablet Commonly known as: ZYRTEC Take 1 tablet (10 mg total) by mouth daily as needed for allergies.   dexamethasone 2 MG tablet Commonly known as: DECADRON Take 1 tablet (2 mg total) by mouth 2 (two) times daily.   Eliquis 5 MG Tabs tablet Generic drug: apixaban TAKE ONE TABLET BY MOUTH 2 TIMES A DAY.   furosemide 20 MG tablet Commonly known as: LASIX Take 1 tablet (20 mg total) by mouth every other day. (MAY TAKE DAILY AS NEEDED FOR INCREASED SWELLING)   hydrALAZINE 100 MG tablet Commonly known as: APRESOLINE TAKE (1) TABLET BY MOUTH (3) TIMES DAILY. What changed: See the new instructions.   insulin lispro 100 UNIT/ML KwikPen Commonly known as: HumaLOG KwikPen Inject 1-10 Units into the skin 4 (four) times daily -  before meals and at bedtime. Blood sugars 70-120: 1 unit 121-150: 2 units 151-200: 3 units 201-250: 4 units 251-300: 5 units 301-350: 6 units 351-400: 9 units More than 400, 10 units and call your doctor   Insulin Pen Needle 29G X 12MM Misc 1 each by Does not  apply route 4 (four) times daily -  before meals and at bedtime.   isosorbide mononitrate 30 MG 24 hr tablet Commonly known as: IMDUR TAKE 1/2 TABLET BY MOUTH ONCE DAILY.   Lokelma 5 g packet Generic drug: sodium zirconium cyclosilicate Take 5 g by mouth daily.   metoprolol succinate 25 MG 24 hr tablet Commonly known as: Toprol XL Take 1 tablet (25 mg total) by mouth daily.   omeprazole 20 MG capsule Commonly known as: PRILOSEC Take 20 mg by mouth daily as needed (reflux).   oxyCODONE 5 MG immediate release tablet Commonly known as: Roxicodone Take 1 tablet (5 mg total) by mouth every 8 (eight) hours as needed for up to 5 days for severe pain or moderate pain.   sodium polystyrene 15 GM/60ML suspension Commonly known as: KAYEXALATE Take 60 mLs (15 g total) by mouth 2 (two) times daily as needed. For high serum potassium >  5   Ventolin HFA 108 (90 Base) MCG/ACT inhaler Generic drug:  albuterol Inhale 2 puffs into the lungs every 6 (six) hours as needed for wheezing or shortness of breath.        Follow-up Information     Virl Cagey, MD Follow up on 06/18/2021.   Specialty: General Surgery Why: post op wound check Contact information: [address] [place] [facility] [address] [phone]                Allergies  Allergen Reactions   Augmentin [Amoxicillin-Pot Clavulanate] Other (See Comments)    Increased liver enzymes Has patient had a PCN reaction causing immediate rash, facial/tongue/throat swelling, SOB or lightheadedness with hypotension: No Has patient had a PCN reaction causing severe rash involving mucus membranes or skin necrosis: No Has patient had a PCN reaction that required hospitalization: No Has patient had a PCN reaction occurring within the last 10 years: No  If all of the above answers are "NO", then may proceed with Cephalosporin use.    Flomax [Tamsulosin Hcl] Nausea Only    Dizziness  Pt is on flomax     Procedures /Studies:   MR Brain W Wo Contrast  Addendum Date: 05/10/2021   ADDENDUM REPORT: 05/10/2021 20:03 ADDENDUM: Not included in the impression, there is right posterior auricular soft tissue likely reflecting adenopathy. Electronically Signed   By: Macy Mis M.D.   On: 05/10/2021 20:03   Result Date: 05/10/2021 CLINICAL DATA:  Hematologic malignancy, surveillance EXAM: MRI HEAD WITHOUT AND WITH CONTRAST TECHNIQUE: Multiplanar, multiecho pulse sequences of the brain and surrounding structures were obtained without and with intravenous contrast. CONTRAST:  8.43m GADAVIST GADOBUTROL 1 MMOL/ML IV SOLN COMPARISON:  None. FINDINGS: Brain: Several (approximately 5) enhancing lesions are identified likely involving brain parenchyma. Larger posterior fossa lesion measures 1.2 cm and is located within the midline cerebellar vermis along the roof of the fourth ventricle. Largest supratentorial lesion involves the anterior  right frontal lobe and measures 1.1 cm. There is associated edema without significant mass effect. Minimal diffusion hyperintensity associated with right frontal lesions. No hemorrhage. There is no hydrocephalus or extra-axial fluid collection. Additional patchy foci of T2 hyperintensity in the supratentorial white matter are nonspecific but may reflect chronic microvascular ischemic changes. Vascular: Major vessel flow voids at the skull base are preserved. Skull and upper cervical spine: Normal marrow signal is preserved. Sinuses/Orbits: Paranasal sinus mucosal thickening. Polypoid soft tissue in posterior left nasal cavity extending into the nasopharynx. Orbits are unremarkable. Other: Right posterior auricular soft tissue likely reflects adenopathy. Sella  is unremarkable. Mastoid air cells are clear. IMPRESSION: Several small enhancing lesions with edema but no significant mass effect. 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 Likely involving brain parenchyma but given peripheral location, leptomeningeal involvement is not excluded. These results will be called to the ordering clinician or representative by the Radiologist Assistant, and communication documented in the PACS or [employer]. Electronically Signed: By: Macy Mis M.D. On: 05/10/2021 19:25   US Renal  Result Date: 06/02/2021 CLINICAL DATA:  Chronic kidney disease. EXAM: RENAL / URINARY TRACT ULTRASOUND COMPLETE COMPARISON:  October 02, 2019. FINDINGS: Right Kidney: Renal measurements: 12.4 x 5.8 x 5.8 cm = volume: 220 mL. Mild cortical thinning is noted. Increased echogenicity of renal parenchyma is noted. No mass or hydronephrosis visualized. Left Kidney: Renal measurements: 12.1 x 6.2 x 6.8 cm = volume: 357 mL. 5.3 cm simple cyst is noted. Increased echogenicity of renal parenchyma is noted. No mass or hydronephrosis visualized. Bladder: Appears normal for degree of bladder distention. Ureteral jets are not visualized. Other:  None. IMPRESSION: Increased echogenicity of renal parenchyma is noted bilaterally consistent with medical renal disease. No definite hydronephrosis or renal obstruction is noted. Electronically Signed   By: Marijo Conception M.D.   On: 06/02/2021 09:52   NM PET Image Restag (PS) Skull Base To Thigh  Result Date: 05/15/2021 CLINICAL DATA:  Subsequent treatment strategy for lymphoma. EXAM: NUCLEAR MEDICINE PET SKULL BASE TO THIGH TECHNIQUE: 9.23 mCi F-18 FDG was injected intravenously. Full-ring PET imaging was performed from the skull base to thigh after the radiotracer. CT data was obtained and used for attenuation correction and anatomic localization. Fasting blood glucose: 108 mg/dl COMPARISON:  06/23/2020 FINDINGS: Mediastinal blood pool activity: SUV max 2.48 Liver activity: SUV max 3.32 NECK: Right posterior auricular lesion measures 1.6 cm within SUV max 6.14, image 33/3. (Deauville criteria 4). Formally this measured 1.6 cm max of 11.7. The left posterior scalp lesion has an SUV max of 4.0, image 34/3. (Deauville criteria 4). Formally 10.39. FDG avid right level 2 lymph node measures 7 mm with SUV max of 5.5 (Deauville criteria 5). This is new compared with previous exam. Incidental CT findings: none CHEST: Measures 5.6 x 3.0 cm with SUV max 17.94, image 122/3 (Deauville criteria 4). Formally this measured 4.7 x 4.6 cm within SUV max of 16.8. No FDG avid supraclavicular, right axillary, mediastinal, or hilar lymph. No FDG avid pulmonary nodule or mass identified. Incidental CT findings: Mild aortic atherosclerosis with coronary artery calcifications. ABDOMEN/PELVIS: There are new small bilateral tracer avid adrenal nodules. On the left this measures 1.2 cm with SUV max of 6.1, image 254/7 on the right this measures 0.9 cm with SUV max of 6.80 (Deauville criteria 5). FDG avid left inguinal lymph node measures 0.8 cm within SUV max of 2.65 (Deauville criteria 3). Formally this measured 0.9 cm with SUV max  8.7. The spleen has a normal size without focal areas of increased uptake. Left kidney cyst is again noted. No abnormal uptake within the liver or pancreas. Incidental CT findings: Cholecystectomy. Left kidney cyst. Aortic atherosclerosis. SKELETON: Interval improvement in multifocal FDG avid bone lesions, including: Index lesion within the right iliac bone has an SUV max of 2.63 (Deauville criteria 3.). Formally 11.59. Large tracer avid lesion involving the T11 vertebra has an SUV max of 1.62 on today's study (Deauville criteria 2). Formally 15.83. Persistent tracer avid tumor is identified within the medial wall of the right acetabulum within SUV max of 9.64 (Deauville criteria  4). This is similar to the previous exam. Incidental CT findings: none IMPRESSION: 1. Imaging findings on today's exam reflect a partial response to therapy. 2. Persistent Deauville criteria 4 disease is identified within the soft tissues of the neck and left axilla, and right acetabulum. New areas of increased uptake are noted within the right cervical nodal chain and bilateral adrenal glands (Deauville criteria 5). Electronically Signed   By: [name] Moors M.D.   On: 05/15/2021 14:22   DG Chest Port 1 View  Result Date: 06/01/2021 CLINICAL DATA:  Elevated potassium, leg weakness EXAM: PORTABLE CHEST 1 VIEW COMPARISON:  08/01/2020 FINDINGS: Lungs are clear.  No pleural effusion or pneumothorax. Heart is top-normal in size.  Prominent epicardial fat. IMPRESSION: No evidence of acute cardiopulmonary disease. Electronically Signed   By: Julian Hy M.D.   On: 06/01/2021 21:06    Subjective:   Patient was seen and examined 06/05/2021, 10:49 AM Patient stable today. No acute distress.  No issues overnight Stable for discharge.  Discharge Exam:    Vitals:   06/05/21 0821 06/05/21 1000 06/05/21 1015 06/05/21 1037  BP: 127/72 115/81 119/76 118/68  Pulse: (!) 55 63 (!) 56 (!) 52  Resp: '18 16 15   ' Temp: 97.7 F (36.5 C) 98  F (36.7 C)    TempSrc: Oral     SpO2: 100% 99% 100% 100%  Weight:      Height:        General: Pt lying comfortably in bed & appears in no obvious distress. Cardiovascular: S1 & S2 heard, RRR, S1/S2 +. No murmurs, rubs, gallops or clicks. No JVD or pedal edema. Respiratory: Clear to auscultation without wheezing, rhonchi or crackles. No increased work of breathing. Abdominal:  Non-distended, non-tender & soft. No organomegaly or masses appreciated. Normal bowel sounds heard. CNS: Alert and oriented. No focal deficits. Extremities: no edema, no cyanosis      The results of significant diagnostics from this hospitalization (including imaging, microbiology, ancillary and laboratory) are listed below for reference.      Microbiology:   Recent Results (from the past 240 hour(s))  Resp Panel by RT-PCR (Flu A&B, Covid) Nasopharyngeal Swab     Status: None   Collection Time: 06/01/21  8:34 PM   Specimen: Nasopharyngeal Swab; Nasopharyngeal(NP) swabs in vial transport medium  Result Value Ref Range Status   SARS Coronavirus 2 by RT PCR NEGATIVE NEGATIVE Final    Comment: (NOTE) SARS-CoV-2 target nucleic acids are NOT DETECTED.  The SARS-CoV-2 RNA is generally detectable in upper respiratory specimens during the acute phase of infection. The lowest concentration of SARS-CoV-2 viral copies this assay can detect is 138 copies/mL. A negative result does not preclude SARS-Cov-2 infection and should not be used as the sole basis for treatment or other patient management decisions. A negative result may occur with  improper specimen collection/handling, submission of specimen other than nasopharyngeal swab, presence of viral mutation(s) within the areas targeted by this assay, and inadequate number of viral copies(<138 copies/mL). A negative result must be combined with clinical observations, patient history, and epidemiological information. The expected result is Negative.  Fact Sheet  for Patients:  EntrepreneurPulse.com.au  Fact Sheet for Healthcare Providers:  IncredibleEmployment.be  This test is no t yet approved or cleared by the [place] FDA and  has been authorized for detection and/or diagnosis of SARS-CoV-2 by FDA under an Emergency Use Authorization (EUA). This EUA will remain  in effect (meaning this test can be used) for the  duration of the COVID-19 declaration under Section 564(b)(1) of the Act, 21 U.S.C.section 360bbb-3(b)(1), unless the authorization is terminated  or revoked sooner.       Influenza A by PCR NEGATIVE NEGATIVE Final   Influenza B by PCR NEGATIVE NEGATIVE Final    Comment: (NOTE) The Xpert Xpress SARS-CoV-2/FLU/RSV plus assay is intended as an aid in the diagnosis of influenza from Nasopharyngeal swab specimens and should not be used as a sole basis for treatment. Nasal washings and aspirates are unacceptable for Xpert Xpress SARS-CoV-2/FLU/RSV testing.  Fact Sheet for Patients: EntrepreneurPulse.com.au  Fact Sheet for Healthcare Providers: IncredibleEmployment.be  This test is not yet approved or cleared by the [place] FDA and has been authorized for detection and/or diagnosis of SARS-CoV-2 by FDA under an Emergency Use Authorization (EUA). This EUA will remain in effect (meaning this test can be used) for the duration of the COVID-19 declaration under Section 564(b)(1) of the Act, 21 U.S.C. section 360bbb-3(b)(1), unless the authorization is terminated or revoked.  Performed at [facility], [address], [place], [place] [address]      Labs:   CBC: Recent Labs  Lab 06/01/21 1503 06/01/21 1914 06/02/21 0442 06/03/21 1026 06/03/21 1525 06/04/21 0510 06/05/21 0538  WBC 24.6* 25.3* 21.1*  --  16.8* 11.4* 14.5*  NEUTROABS 22.5* 23.1*  --   --   --   --   --   HGB 15.5 15.2 13.4 15.0 15.4 12.3* 12.1*  HCT 46.0 45.5 39.7 44.0 45.3  36.4* 34.8*  MCV 92.6 93.0 90.6  --  90.6 91.9 91.3  PLT 199 225 162  --  185 137* 416*   Basic Metabolic Panel: Recent Labs  Lab 06/01/21 1503 06/01/21 1914 06/01/21 1929 06/01/21 2138 06/02/21 0442 06/03/21 1026 06/03/21 1525 06/04/21 0510 06/05/21 0538  NA 128* 124*  --   --  130* 132*  --  132* 132*  K 6.8* 6.6*  --   --  5.6* 5.9*  --  4.6 4.3  CL 104 103  --   --  100 102  --  102 104  CO2 15* 12*  --   --  22  --   --  24 23  GLUCOSE 434* 395*  --  149* 147* 122*  --  160* 172*  BUN 70* 72*  --   --  54* 46*  --  49* 47*  CREATININE 2.11* 2.15*  --   --  1.54* 1.70* 1.70* 1.59* 1.50*  CALCIUM 8.7* 8.5*  --   --  7.8*  --   --  7.9* 7.7*  PHOS  --   --  4.3  --   --   --   --   --   --    Liver Function Tests: Recent Labs  Lab 06/01/21 1914 06/02/21 0442  AST 22 19  ALT 33 28  ALKPHOS 60 40  BILITOT 0.8 1.2  PROT 6.8 5.5*  ALBUMIN 3.3* 2.7*   BNP (last 3 results) Recent Labs    07/17/20 0440 07/31/20 2215 06/03/21 1525  BNP 1,606.0* 25.0 68.0   Cardiac Enzymes: No results for input(s): CKTOTAL, CKMB, CKMBINDEX, TROPONINI in the last 168 hours. CBG: Recent Labs  Lab 06/04/21 1644 06/04/21 2148 06/05/21 0503 06/05/21 0819 06/05/21 1009  GLUCAP 335* 274* 169* 158* 195*   Hgb A1c No results for input(s): HGBA1C in the last 72 hours. Lipid Profile No results for input(s): CHOL, HDL, LDLCALC, TRIG, CHOLHDL, LDLDIRECT in the last  72 hours. Thyroid function studies No results for input(s): TSH, T4TOTAL, T3FREE, THYROIDAB in the last 72 hours.  Invalid input(s): FREET3 Anemia work up No results for input(s): VITAMINB12, FOLATE, FERRITIN, TIBC, IRON, RETICCTPCT in the last 72 hours. Urinalysis    Component Value Date/Time   COLORURINE YELLOW 06/01/2021 1934   APPEARANCEUR CLEAR 06/01/2021 1934   APPEARANCEUR Clear 06/10/2020 0851   LABSPEC 1.015 06/01/2021 1934   PHURINE 5.0 06/01/2021 1934   GLUCOSEU >=500 (A) 06/01/2021 1934   HGBUR NEGATIVE  06/01/2021 1934   BILIRUBINUR NEGATIVE 06/01/2021 1934   BILIRUBINUR Negative 06/10/2020 [place] 06/01/2021 1934   PROTEINUR NEGATIVE 06/01/2021 1934   UROBILINOGEN 2.0 (A) 04/09/2020 1050   NITRITE NEGATIVE 06/01/2021 1934   LEUKOCYTESUR NEGATIVE 06/01/2021 1934         Time coordinating discharge: Over 45 minutes  SIGNED: Deatra James, MD, FACP, FHM. [facility],  Please use [email or web address] to Page If 7PM-7AM, please contact night-coverage [email or web address] 06/05/2021, 10:49 AM

## 2021-06-05 NOTE — Transfer of Care (Signed)
Immediate Anesthesia Transfer of Care Note  Patient: Darius Smith  Procedure(s) Performed: AXILLARY LYMPH NODE BIOPSY (Left: Axilla)  Patient Location: PACU  Anesthesia Type:General  Level of Consciousness: awake, alert  and oriented  Airway & Oxygen Therapy: Patient Spontanous Breathing and Patient connected to nasal cannula oxygen  Post-op Assessment: Report given to RN and Post -op Vital signs reviewed and stable  Post vital signs: Reviewed and stable  Last Vitals:  Vitals Value Taken Time  BP 129/78 06/05/21  0956  Temp    Pulse 63 06/05/21 0956  Resp 8 06/05/21 0956  SpO2 98 % 06/05/21 0956  Vitals shown include unvalidated device data.  Last Pain:  Vitals:   06/05/21 0821  TempSrc: Oral  PainSc: 0-No pain      Patients Stated Pain Goal: 6 (81/85/90 9311)  Complications: No notable events documented.

## 2021-06-05 NOTE — Op Note (Signed)
[facility] Operative Note  06/05/21  Preoperative Diagnosis: Lymphoma, left axillary adenopathy   Postoperative Diagnosis: Same   Procedure(s) Performed: Excisional lymph node biopsy, axilla on left    Surgeon: Lanell Matar. Constance Haw, MD   Assistants: No qualified resident was available    Anesthesia: General endotracheal   Anesthesiologist: Denese Killings, MD    Specimens: Left axillary node    Estimated Blood Loss: Minimal   Blood Replacement: None    Complications: None   Wound Class: Clean    Operative Indications: Mr. 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 He was scheduled to have Dr. Arnoldo Morale complete this but due to hyperkalemia was unable to get this done earlier in the week. He agreed to have me complete his biopsy today.   Findings:Pet scan with 2.5cm lymph node in the left axilla, concordant findings with imaging    Procedure: The patient was taken to the operating room and placed supine. General endotracheal anesthesia was induced. Intravenous antibiotics were administered per protocol.  The left axilla was prepared and draped in the usual sterile fashion.   An incision was made over the palpable node and carried down into the axilla. The lymph node was visualized and taking care to not crush it or injure it, the tissue surrounding the lymph node was dissected and clipped to ensure no bleeding or lymphatic drainage. This was completed with sharp dissection circumferentially around the node. The node was removed and was noted to be very matted to the remaining chain.  Clips had been used around the node and around the hilum to ensure no bleeding or lymphatic leak. The deep space was hemostatic. Vicryl 3-0 Interrupted closed the deep space. Local was injected. The skin was closed with 4-0 Monocryl subcuticular and dermabond. The specimen  was sent fresh with saline.   Final inspection revealed acceptable hemostasis. All counts were correct at the end of the case. The patient was awakened from anesthesia and extubated without complication.  The patient went to the PACU in stable condition.  Curlene Labrum, MD [facility] [address] [place], [place] [phone] (office)

## 2021-06-05 NOTE — Discharge Instructions (Addendum)
Discharge instructions after axillary surgery:   Restart your blood thinner on Monday 06/08/2021.   Common Complaints: Pain and bruising at the incision sites.  Swelling at the incision sites. Stiffness of the arm.   Diet/ Activity: Diet as tolerated.  You may shower but do not take hot showers as this can disrupt the glue. Walk everyday for at least 15-20 minutes. Deep cough and move around every 1-2 hours in the first few days after surgery.  Do not lift > 10 lbs for the first 2 weeks after surgery. Do not do anything that makes you feel like you are putting unnecessary pull or stretch on the incision sites.  Do move your arm and shoulder (see exercises options below). If you do not move then you can get stiff and hurt more.  Do not pick at the dermabond glue on your incision sites.  This glue film will remain in place for 1-2 weeks and will start to peel off.  Do not place lotions or balms on your incision unless instructed to specifically by Dr. Constance Haw.   Pain Expectations and Narcotics: -After surgery you will have pain associated with your incisions and this is normal. The pain is muscular and nerve pain, and will get better with time. -You are encouraged and expected to take non narcotic medications like tylenol and ibuprofen (when able) to treat pain as multiple modalities can aid with pain treatment. -Narcotics are only used when pain is severe or there is breakthrough pain. -You are not expected to have a pain score of 0 after surgery, as we cannot prevent pain. A pain score of 3-4 that allows you to be functional, move, walk, and tolerate some activity is the goal. The pain will continue to improve over the days after surgery and is dependent on your surgery. -Due to [place] law, we are only able to give a certain amount of pain medication to treat post operative pain, and we only give additional narcotics on a patient by patient basis.  -For most laparoscopic surgery, studies have  shown that the majority of patients only need 10-15 narcotic pills, and for open surgeries most patients only need 15-20.   -Having appropriate expectations of pain and knowledge of pain management with non narcotics is important as we do not want anyone to become addicted to narcotic pain medication.  -Using ice packs in the first 48 hours and heating pads after 48 hours, wearing an abdominal binder (when recommended), and using over the counter medications are all ways to help with pain management.   -Simple acts like meditation and mindfulness practices after surgery can also help with pain control and research has proven the benefit of these practices.  Medication: Take tylenol and ibuprofen as needed for pain control, alternating every 4-6 hours.  Example:  Tylenol 1000mg  @ 6am, 12noon, 6pm, 7midnight (Do not exceed 4000mg  of tylenol a day). Ibuprofen 800mg  @ 9am, 3pm, 9pm, 3am (Do not exceed 3600mg  of ibuprofen a day).  Take Roxicodone for breakthrough pain every 4 hours.  Take Colace for constipation related to narcotic pain medication. If you do not have a bowel movement in 2 days, take Miralax over the counter.  Drink plenty of water to also prevent constipation.   Contact Information: If you have questions or concerns, please call our office, [phone], Monday- Thursday 8AM-5PM and Friday 8AM-12Noon.  If it is after hours or on the weekend, please call [facility], [phone], [phone], and ask to speak to the  surgeon on call for Dr. Constance Haw at [facility].   Exercises After Axillary Surgery Do at least a few of the exercises below twice a day. It is ok to start the day after surgery and gradually build up the amount and type of exercises you do.  Deep Breathing Exercise Deep breathing can help you relax and ease discomfort and tightness around your incision (surgical cut). It's also a very good way to relieve stress during the day.  Sit comfortably in a chair. Take  a slow, deep breath through your nose. Let your chest and belly expand. Breathe out slowly through your mouth. Repeat as many times as needed.  Arm and Shoulder Exercises Doing arm and shoulder exercises will help you get back your full range of motion on your affected side (the side where you had your surgery). With full range of motion, you'll be able to: Move your arm over your head and out to the side Move your arm behind your neck Move your arm to the middle of your back Do each of the exercises below 5 times a day. Keep doing this until you have a full range of motion again and can use your arm as you did before surgery in all your normal activities. This includes activities at work, at home, and in recreation or sports. If you had limited movement in your arm before surgery, your goal will be to get back as much movement as you had before.  If you get your full range of motion back quickly, keep doing these exercises once a day instead of 5 times a day. This is especially true if you feel any tightness in your chest, shoulder, or under your affected arm. These exercises can help keep scar tissue from forming in your armpit and shoulder. Scar tissue can limit your arm movements later.  If you still have trouble moving your shoulder 4 weeks after your surgery, tell your surgeon. They'll tell you if you need more rehabilitation, such as physical or occupational therapy.  If you had one of the following surgeries, you can do the following set of exercises on the first day after your surgery, as long as your surgeon tells you it's safe.  Shoulder rolls The shoulder roll is a good exercise to start with because it gently stretches your chest and shoulder muscles.  Stand or sit comfortably with your arms relaxed at your sides. Start with backward shoulder rolls. In a circular motion, bring your shoulders forward, up, backward, and down. Do this 10 times. Switch directions and do 10 forward  shoulder rolls. Bring your shoulders backward, up, forward, and down. Do this 10 times. Try to make the circles as big as you can and move both shoulders at the same time. If you have some tightness across your incision or chest, start with smaller circles and make them bigger as the tightness decreases. The backward direction might feel a little tighter across your chest than the forward direction. This will get better with practice.  Shoulder wings The shoulder wings exercise will help you get back outward movement of your shoulder. You can do this exercise while sitting or standing.  Place your hands on your chest or collarbone. Raise your elbows out to the side, limiting your range of motion as instructed by your healthcare team. Slowly lower your elbows. Do this 10 times. Then, slowly lower your hands. If you feel discomfort while doing this exercise, hold your position and do the deep breathing exercise.  If the discomfort passes, raise your elbows a little higher. If it doesn't pass, don't raise your elbows any higher. Finish the exercise raising your elbows only high enough to feel a gentle stretch and no discomfort.  Arm circles Stand with your feet slightly apart for balance. Raise your affected arm out to the side as high as you can, limiting your range of movement as instructed by your healthcare team. Start making slow, backward circles in the air with your arm. Make sure you're moving your arm from your shoulder, not your elbow. Keep your elbow straight. Increase the size of the circles until they're as big as you can comfortably make them, limiting your range of motion as instructed by your healthcare team. If you feel any aching or if your arm is tired, take a break. Keep doing the exercise when you feel better. Do 10 full backward circles. Then, slowly lower your arm to your side. Rest your arm for a moment. Follow steps 1 to 4 again, but this time make slow, forward circles.  W  exercise You can do the W exercise while sitting or standing.  Form a "W" with your arms out to the side and palms facing forward (see Figure 4). Try to bring your hands up so they're even with your face. If you can't raise your arms that high, bring them to the highest comfortable position. Make sure to limit your range of motion as instructed by your healthcare team. Pinch your shoulder blades together and downward, as if you're squeezing a pencil between them. If you feel discomfort, stop at that position and do the deep breathing exercise. If the discomfort passes, try to bring your arms back a little further. If it doesn't pass, don't reach any further. Hold the furthest position that doesn't cause discomfort. Squeeze your shoulder blades together and downward for 5 seconds. Slowly bring your arms back down to the starting position. Repeat this movement 10 times.  Back Climb You can do the back climb stretch while sitting or standing. You'll need a timer or stopwatch.  Place your hands behind your back. Hold the hand on your affected side with your other hand. If you had surgery on both breasts, use the arm that moves most easily to hold the other. Slowly slide your hands up the center of your back as far as you can. If you feel tightness near your incision, stop at that position and do the deep breathing exercise. If the tightness decreases, try to slide your hands up a little further. If it doesn't decrease, don't slide your hands up any further. Hold the highest position you can for 1 minute. Use your stopwatch or timer to keep track. You should feel a gentle stretch in your shoulder area. After 1 minute, slowly lower your hands.  Hands behind neck You can do the hands behind neck stretch while sitting or standing. You'll need a timer or stopwatch.  Clasp your hands together on your lap or in front of you. Slowly raise your hands toward your head, keeping your elbows together in front  of you, not out to the sides. Keep your head level. Don't bend your neck or head forward. Slide your hands over your head until you reach the back of your neck. When you get to this point, spread your elbows out to the sides. Hold this position for 1 minute. Use your stopwatch or timer to keep track. Breathe normally. Don't hold your breath as you stretch your  body. If you have some tightness across your incision or chest, hold your position and do the deep breathing exercise. If the tightness decreases, continue with the movement. If the tightness stays the same, reach up and stretch your elbows back as best as you can without causing discomfort. Hold the position you're most comfortable in for 1 minute. Slowly come out of the stretch by bringing your elbows together and sliding your hands over your head. Then, slowly lower your arms.  Forward wall crawls You'll need 2 pieces of tape for the forward wall crawl exercise.  Stand facing a wall. Your toes should be about 6 inches (15 centimeters) from the wall. Reach as high as you can with your unaffected arm. [place] that point with a piece of tape. This will be the goal for your affected arm. If you had surgery on both breasts, set your goal using the arm that moves most comfortably. Place both hands against the wall at a level that's comfortable. Crawl your fingers up the wall as far as you can, keeping them even with each other.. Try not to look up toward your hands or arch your back. When you get to the point where you feel a good stretch, but not pain, do the deep breathing exercise. Return to the starting position by crawling your fingers back down the wall. Repeat the wall crawl 10 times. Each time you raise your hands, try to crawl a little bit higher. On the 10th crawl, use the other piece of tape to mark the highest point you reached with your affected arm. This will let you to see your progress each time you do this exercise. As you become  more flexible, you may need to take a step closer to the wall so you can reach a little higher.   Side wall crawls You'll also need 2 pieces of tape for the side wall crawl exercise.  You shouldn't feel pain while doing this exercise. It's normal to feel some tightness or pulling across the side of your chest. Focus on your breathing until the tightness decreases. Breathe normally throughout this exercise. Don't hold your breath.  Be careful not to turn your body toward the wall while doing this exercise. Make sure only the side of your body faces the wall.  Stand with your unaffected side closest to the wall, about 1 foot (30.5 centimeters) away from the wall. Reach as high as you can with your unaffected arm. [place] that point with a piece of tape (see Figure 8). This will be the goal for your affected arm. Turn your body so your affected side is now closest to the wall. If you had surgery on both breasts, start with either side closest to the wall. Crawl your fingers up the wall as far as you can. When you get to the point where you feel a good stretch, but not pain, do the deep breathing exercise. Return to the starting position by crawling your fingers back down the wall. Repeat this exercise 10 times. On your 10th crawl, use a piece of tape to mark the highest point you reached with your affected arm. This will let you see your progress each time you do the exercise. If you had surgery on both breasts, repeat the exercise with your other arm.  Swelling After your surgery, you may have some swelling or puffiness in your hand or arm on your affected side. This is normal and usually goes away on its own.  If  you notice swelling in your hand or arm, follow the tips below to help the swelling go away.  Raise your arm above your head and do hand pumps several times a day. To do hand pumps, slowly open and close your fist 10 times. This will help drain the fluid out of your arm. Don't hold your  arm straight up over your head for more than a few minutes. This can cause your arm muscles to get tired. Raise your arm to the side a few times a day for about 20 minutes at a time. To do this, sit or lie down on your back. Rest your arm on a few pillows next to you so it's raised above the level of your heart. If you're able to sleep on your unaffected side, you can place 1 or 2 pillows in front of you and rest your affected arm on them while you sleep. If the swelling doesn't go down within 4 to 6 weeks, call your surgeon or nurse.

## 2021-06-05 NOTE — Interval H&P Note (Signed)
History and Physical Interval Note:  06/05/2021 8:39 AM  Darius Smith  has presented today for surgery, with the diagnosis of lymphoma.  The various methods of treatment have been discussed with the patient and family. After consideration of risks, benefits and other options for treatment, the patient has consented to  Procedure(s): AXILLARY LYMPH NODE BIOPSY (Left) as a surgical intervention.  The patient's history has been reviewed, patient examined, no change in status, stable for surgery.  I have reviewed the patient's chart and labs.  Questions were answered to the patient's satisfaction.    Plan for left axillary biopsy of the lymph node. Virl Cagey

## 2021-06-05 NOTE — Anesthesia Postprocedure Evaluation (Signed)
Anesthesia Post Note  Patient: Darius Smith  Procedure(s) Performed: AXILLARY LYMPH NODE BIOPSY (Left: Axilla)  Patient location during evaluation: PACU Anesthesia Type: General Level of consciousness: awake and alert and oriented Pain management: pain level controlled Vital Signs Assessment: post-procedure vital signs reviewed and stable Respiratory status: spontaneous breathing and respiratory function stable Cardiovascular status: blood pressure returned to baseline and stable Postop Assessment: no apparent nausea or vomiting Anesthetic complications: no   No notable events documented.   Last Vitals:  Vitals:   06/05/21 1015 06/05/21 1037  BP: 119/76 118/68  Pulse: (!) 56 (!) 52  Resp: 15   Temp:    SpO2: 100% 100%    Last Pain:  Vitals:   06/05/21 1100  TempSrc:   PainSc: 0-No pain                 Grizelda Piscopo C Argyle Gustafson

## 2021-06-08 ENCOUNTER — Telehealth: Payer: Self-pay | Admitting: *Deleted

## 2021-06-08 ENCOUNTER — Ambulatory Visit (INDEPENDENT_AMBULATORY_CARE_PROVIDER_SITE_OTHER): Payer: Medicare Other

## 2021-06-08 ENCOUNTER — Encounter (HOSPITAL_COMMUNITY): Payer: Self-pay | Admitting: General Surgery

## 2021-06-08 ENCOUNTER — Encounter: Payer: Self-pay | Admitting: Internal Medicine

## 2021-06-08 ENCOUNTER — Telehealth: Payer: Self-pay

## 2021-06-08 DIAGNOSIS — E119 Type 2 diabetes mellitus without complications: Secondary | ICD-10-CM

## 2021-06-08 DIAGNOSIS — C8258 Diffuse follicle center lymphoma, lymph nodes of multiple sites: Secondary | ICD-10-CM

## 2021-06-08 DIAGNOSIS — C7931 Secondary malignant neoplasm of brain: Secondary | ICD-10-CM

## 2021-06-08 NOTE — Telephone Encounter (Signed)
Transition Care Management Unsuccessful Follow-up Telephone Call  Date of discharge and from where:  06/05/21 [place]   Attempts:  2nd Attempt  Reason for unsuccessful TCM follow-up call:  Unable to leave message

## 2021-06-08 NOTE — Chronic Care Management (AMB) (Signed)
  Chronic Care Management   Note  06/08/2021 Name: Darius Smith MRN: [id number] DOB: [birth year]  Darius Smith is a 75 y.o. year old male who is a primary care patient of [name], MD. I reached out to [name] by phone today in response to a referral sent by Darius Smith PCP, Dr. Posey Pronto.      Darius Smith was given information about Chronic Care Management services today including:  CCM service includes personalized support from designated clinical staff supervised by his physician, including individualized plan of care and coordination with other care providers 24/7 contact phone numbers for assistance for urgent and routine care needs. Service will only be billed when office clinical staff spend 20 minutes or more in a month to coordinate care. Only one practitioner may furnish and bill the service in a calendar month. The patient may stop CCM services at any time (effective at the end of the month) by phone call to the office staff. The patient will be responsible for cost sharing (co-pay) of up to 20% of the service fee (after annual deductible is met).  Darius Smith patient spouse DPR on file  verbally agreed to assistance and services provided by embedded care coordination/care management team today.  Follow up plan: Telephone appointment with care management team member scheduled for:06/08/21 and 06/16/21  Darius Smith Management  Direct Dial: [phone]

## 2021-06-08 NOTE — Patient Instructions (Signed)
Visit Information: Thank you for taking the time to speak with me today.    PATIENT GOALS:   Goals Addressed             This Visit's Progress    Care coordination needs managed/ met   On track    Timeframe:  Long-Range Goal Priority:  High Start Date:   06/08/2021                          Expected End Date:  08/19/2021  Follow up 06/16/2021     Evaluation of current treatment plans and patient's adherence to plan as established by provider Assessed patient spouse understanding of disease state Assessed patient's education and care coordination needs Provided disease specific education to patient/ spouse  Contact insurance provider to determine coverage options for Free Style libre/ DexCom Follow fall safety precautions Take medications as prescribed and renew timely Contact provider for signs/symptoms of infection, new or ongoing symptoms.  Follow up with providers as recommended                  Consent to CCM Services: Darius Smith was given information about Chronic Care Management services including:  CCM service includes personalized support from designated clinical staff supervised by his physician, including individualized plan of care and coordination with other care providers 24/7 contact phone numbers for assistance for urgent and routine care needs. Service will only be billed when office clinical staff spend 20 minutes or more in a month to coordinate care. Only one practitioner may furnish and bill the service in a calendar month. The patient may stop CCM services at any time (effective at the end of the month) by phone call to the office staff. The patient will be responsible for cost sharing (co-pay) of up to 20% of the service fee (after annual deductible is met).  Patient agreed to services and verbal consent obtained.   Patient verbalizes understanding of instructions provided today and agrees to view in [place].   The patient has been provided with  contact information for the care management team and has been advised to call with any health related questions or concerns.  The care management team will reach out to the patient again over the next 30 days.   Darius Plowman RN,BSN,CCM RN Case Manager [phone]   CLINICAL CARE PLAN: Patient Care Plan: Post hospitalization     Problem Identified: Care coordination related to recent hospitalization   Priority: High     Long-Range Goal: Care coordination needs met   Start Date: 06/08/2021  Expected End Date: 08/19/2021  This Visit's Progress: On track  Priority: High  Note:   Current Barriers:  Care coordination needs related to recent hospitalization:  Telephone call to patient. Wife/ HPOA, answered call and verified HIPAA for patient.  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  Hospitalized 06/01/2021 due to elevated potassium.  Discharged 06/02/2021 then readmitted on 06/03/2021 through 06/05/2021 for lymph node biopsy.  Wife reports she assists patient with blood sugar checks and insulin administration.  Wife concerned she is having to stick patient several times to get a good blood flow for blood sugar checks. Wife states patient has been advised to check blood sugars 4 times per day.  RNCM advised wife to contact patients insurance carrier to determine if a free style libre or DexCom would be covered.  Discussed with wife ways to promote good blood flow to fingers  for blood sugar checks.    Wife reports patient has a follow up visit with the oncologist on 06/17/2021, follow up with the general surgeon and A-fib clinic on 06/18/2021.   Wife reports patient is able to afford his medications and takes them as prescribed.  She reports patient resumed his Eliquis today as advised by his doctor.  He has transportation to appointments. RNCM discussed with wife, signs/ symptoms of infection related to biopsy site, Safety precautions due to patient having 2 recent falls,  Hyper/Hypoglycemia management.  Case manager Clinical Goal(s): Patient will attend scheduled medical appointments Patient will take medications as prescribed Patient will follow up with providers as recommended.  Patient/ spouse will monitor for signs/ symptoms of infection to surgical site Patient/ spouse will follow falls safety precautions Patient will monitor for hyper/ hypoglycemic  Work with the care management team to address educational, disease management, and care coordination needs  Call provider office for new or worsened signs and symptoms  Call care management team with questions or concerns Verbalize basic understanding of patient centered plan of care by provider Interventions: Evaluation of current treatment plans and patient's adherence to plan as established by provider Assessed patient spouse understanding of disease state Assessed patient's education and care coordination needs Provided disease specific education to patient/ spouse  Contact insurance provider to determine coverage options for Free Style libre/ DexCom Follow fall safety precautions Take medications as prescribed and renew timely Contact provider for signs/symptoms of infection, new or ongoing symptoms.  Follow up with providers as recommended Follow Up Plan: The patient has been provided with contact information for the care management team and has been advised to call with any health related questions or concerns.  The care management team will reach out to the patient again over the next 30 days.

## 2021-06-08 NOTE — Chronic Care Management (AMB) (Signed)
Chronic Care Management   CCM RN Visit Note  06/08/2021 Name: Darius Smith MRN: [id number] DOB: 19-Mar-1946  Subjective: Darius Smith is a 75 y.o. year old male who is a primary care patient of [name], MD. The care management team was consulted for assistance with disease management and care coordination needs.    Engaged with patient by telephone for  Care coordination  in response to provider referral for case management and/or care coordination services.   Consent to Services:  The patient was given the following information about Chronic Care Management services today, agreed to services, and gave verbal consent: 1. CCM service includes personalized support from designated clinical staff supervised by the primary care provider, including individualized plan of care and coordination with other care providers 2. 24/7 contact phone numbers for assistance for urgent and routine care needs. 3. Service will only be billed when office clinical staff spend 20 minutes or more in a month to coordinate care. 4. Only one practitioner may furnish and bill the service in a calendar month. 5.The patient may stop CCM services at any time (effective at the end of the month) by phone call to the office staff. 6. The patient will be responsible for cost sharing (co-pay) of up to 20% of the service fee (after annual deductible is met). Patient agreed to services and consent obtained.  Patient agreed to services and verbal consent obtained.   Assessment: Review of patient past medical history, allergies, medications, health status, including review of consultants reports, laboratory and other test data, was performed as part of comprehensive evaluation and provision of chronic care management services.   SDOH (Social Determinants of Health) assessments and interventions performed:    CCM Care Plan  Allergies  Allergen Reactions   Augmentin [Amoxicillin-Pot Clavulanate] Other (See Comments)     Increased liver enzymes Has patient had a PCN reaction causing immediate rash, facial/tongue/throat swelling, SOB or lightheadedness with hypotension: No Has patient had a PCN reaction causing severe rash involving mucus membranes or skin necrosis: No Has patient had a PCN reaction that required hospitalization: No Has patient had a PCN reaction occurring within the last 10 years: No  If all of the above answers are "NO", then may proceed with Cephalosporin use.    Flomax [Tamsulosin Hcl] Nausea Only    Dizziness  Pt is on flomax    Outpatient Encounter Medications as of 06/08/2021  Medication Sig Note   acetaminophen (TYLENOL) 500 MG tablet Take 1,000 mg by mouth every 6 (six) hours as needed for moderate pain or headache.    amiodarone (PACERONE) 200 MG tablet Take 1 tablet (200 mg total) by mouth daily.    amLODipine (NORVASC) 10 MG tablet Take 1 tablet (10 mg total) by mouth daily.    blood glucose meter kit and supplies KIT Inject 1 each into the skin 4 (four) times daily -  before meals and at bedtime. Dispense based on patient and insurance preference. Use up to four times daily as directed.    cetirizine (ZYRTEC) 10 MG tablet Take 1 tablet (10 mg total) by mouth daily as needed for allergies.    dexamethasone (DECADRON) 2 MG tablet Take 1 tablet (2 mg total) by mouth 2 (two) times daily.    ELIQUIS 5 MG TABS tablet TAKE ONE TABLET BY MOUTH 2 TIMES A DAY. 06/01/2021: Wife states he is not to take Eliquis on Mon,Tues or Wed due to a scheduled surgery for Wednesday. 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 Arnoldo Morale   furosemide (LASIX) 20 MG tablet Take 1 tablet (20 mg total) by mouth every other day. (MAY TAKE DAILY AS NEEDED FOR INCREASED SWELLING)    hydrALAZINE (APRESOLINE) 100 MG tablet TAKE (1) TABLET BY MOUTH (3) TIMES DAILY. (Patient taking differently: Take 100 mg by mouth 2 (two) times daily.)    insulin lispro (HUMALOG KWIKPEN) 100 UNIT/ML KwikPen Inject 1-10 Units into the skin 4 (four)  times daily -  before meals and at bedtime. Blood sugars 70-120: 1 unit 121-150: 2 units 151-200: 3 units 201-250: 4 units 251-300: 5 units 301-350: 6 units 351-400: 9 units More than 400, 10 units and call your doctor    Insulin Pen Needle 29G X 12MM MISC 1 each by Does not apply route 4 (four) times daily -  before meals and at bedtime.    isosorbide mononitrate (IMDUR) 30 MG 24 hr tablet TAKE 1/2 TABLET BY MOUTH ONCE DAILY.    metoprolol succinate (TOPROL XL) 25 MG 24 hr tablet Take 1 tablet (25 mg total) by mouth daily.    omeprazole (PRILOSEC) 20 MG capsule Take 20 mg by mouth daily as needed (reflux).    oxyCODONE (ROXICODONE) 5 MG immediate release tablet Take 1 tablet (5 mg total) by mouth every 8 (eight) hours as needed for up to 5 days for severe pain or moderate pain.    sodium polystyrene (KAYEXALATE) 15 GM/60ML suspension Take 60 mLs (15 g total) by mouth 2 (two) times daily as needed. For high serum potassium >  5 (Patient not taking: Reported on 06/08/2021) 06/08/2021: Wife states patient has completed.    sodium zirconium cyclosilicate (LOKELMA) 5 g packet Take 5 g by mouth daily.    VENTOLIN HFA 108 (90 Base) MCG/ACT inhaler Inhale 2 puffs into the lungs every 6 (six) hours as needed for wheezing or shortness of breath.    No facility-administered encounter medications on file as of 06/08/2021.    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 Wife/ HPOA, answered call and verified HIPAA for patient.  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  Hospitalized 06/01/2021 due to elevated potassium.  Discharged 06/02/2021 then readmitted on 06/03/2021 through 06/05/2021 for lymph node biopsy.  Wife reports she assists patient with blood sugar checks and insulin administration.  Wife concerned she is having to stick patient several times to get a good blood flow for blood sugar checks. Wife states  patient has been advised to check blood sugars 4 times per day.  RNCM advised wife to contact patients insurance carrier to determine if a free style libre or DexCom would be covered.  Discussed with wife ways to promote good blood flow to fingers for blood sugar checks.    Wife reports patient has a follow up visit with the oncologist on 06/17/2021, follow up with the general surgeon and A-fib clinic on 06/18/2021.   Wife reports patient is able to afford his medications and takes them as prescribed.  She reports patient resumed his Eliquis today as advised by his doctor.  He has transportation to appointments. RNCM discussed with wife, signs/ symptoms of infection related to biopsy site, Safety precautions due to patient having 2 recent falls, Hyper/Hypoglycemia management.  Case manager Clinical Goal(s): Patient will attend scheduled medical appointments Patient will take medications as prescribed Patient will follow up with providers as  recommended.  Patient/ spouse will monitor for signs/ symptoms of infection to surgical site Patient/ spouse will follow falls safety precautions Patient will monitor for hyper/ hypoglycemic  Work with the care management team to address educational, disease management, and care coordination needs  Call provider office for new or worsened signs and symptoms  Call care management team with questions or concerns Verbalize basic understanding of patient centered plan of care by provider Interventions: Evaluation of current treatment plans and patient's adherence to plan as established by provider Assessed patient spouse understanding of disease state Assessed patient's education and care coordination needs Provided disease specific education to patient/ spouse  Contact insurance provider to determine coverage options for Free Style libre/ DexCom Follow fall safety precautions Take medications as prescribed and renew timely Contact provider for signs/symptoms of infection, new or ongoing symptoms.  Follow up with providers as recommended Follow Up Plan: The patient has been provided with contact information for the care management team and has been advised to call with any health related questions or concerns.  The care management team will reach out to the patient again over the next 30 days.              Plan:The patient has been provided with contact information for the care management team and has been advised to call with any health related questions or concerns.  and The care management team will reach out to the patient again over the next 30 days. Quinn Plowman RN,BSN,CCM RN Case Manager [phone]

## 2021-06-09 ENCOUNTER — Telehealth (INDEPENDENT_AMBULATORY_CARE_PROVIDER_SITE_OTHER): Payer: Medicare Other | Admitting: General Surgery

## 2021-06-09 ENCOUNTER — Other Ambulatory Visit: Payer: Self-pay | Admitting: *Deleted

## 2021-06-09 DIAGNOSIS — C8258 Diffuse follicle center lymphoma, lymph nodes of multiple sites: Secondary | ICD-10-CM

## 2021-06-09 LAB — SURGICAL PATHOLOGY

## 2021-06-09 MED ORDER — FREESTYLE LIBRE SENSOR SYSTEM MISC: 1.0000 | Freq: Once | 3 refills | Status: AC

## 2021-06-09 NOTE — Telephone Encounter (Signed)
[facility]  Left patient message that lymphoma was found as expected in the lymph node.  Will make sure Dr. Delton Coombes is aware. He sees him 9/28.  Curlene Labrum, MD [facility] [address] [place], [place] [address] [phone] (office)

## 2021-06-10 ENCOUNTER — Other Ambulatory Visit: Payer: Self-pay

## 2021-06-10 ENCOUNTER — Inpatient Hospital Stay (HOSPITAL_COMMUNITY): Payer: Medicare Other | Attending: Hematology

## 2021-06-10 DIAGNOSIS — C8294 Follicular lymphoma, unspecified, lymph nodes of axilla and upper limb: Secondary | ICD-10-CM

## 2021-06-10 DIAGNOSIS — C8298 Follicular lymphoma, unspecified, lymph nodes of multiple sites: Secondary | ICD-10-CM | POA: Diagnosis not present

## 2021-06-10 DIAGNOSIS — E875 Hyperkalemia: Secondary | ICD-10-CM | POA: Insufficient documentation

## 2021-06-10 LAB — CBC WITH DIFFERENTIAL/PLATELET
Abs Immature Granulocytes: 0.2 10*3/uL — ABNORMAL HIGH (ref 0.00–0.07)
Basophils Absolute: 0 10*3/uL (ref 0.0–0.1)
Basophils Relative: 0 %
Eosinophils Absolute: 0.1 10*3/uL (ref 0.0–0.5)
Eosinophils Relative: 0 %
HCT: 37.4 % — ABNORMAL LOW (ref 39.0–52.0)
Hemoglobin: 12.9 g/dL — ABNORMAL LOW (ref 13.0–17.0)
Immature Granulocytes: 1 %
Lymphocytes Relative: 8 %
Lymphs Abs: 1.1 10*3/uL (ref 0.7–4.0)
MCH: 31.4 pg (ref 26.0–34.0)
MCHC: 34.5 g/dL (ref 30.0–36.0)
MCV: 91 fL (ref 80.0–100.0)
Monocytes Absolute: 0.7 10*3/uL (ref 0.1–1.0)
Monocytes Relative: 5 %
Neutro Abs: 12.3 10*3/uL — ABNORMAL HIGH (ref 1.7–7.7)
Neutrophils Relative %: 86 %
Platelets: 167 10*3/uL (ref 150–400)
RBC: 4.11 MIL/uL — ABNORMAL LOW (ref 4.22–5.81)
RDW: 12.4 % (ref 11.5–15.5)
WBC: 14.4 10*3/uL — ABNORMAL HIGH (ref 4.0–10.5)
nRBC: 0 % (ref 0.0–0.2)

## 2021-06-10 LAB — COMPREHENSIVE METABOLIC PANEL
ALT: 36 U/L (ref 0–44)
AST: 23 U/L (ref 15–41)
Albumin: 2.9 g/dL — ABNORMAL LOW (ref 3.5–5.0)
Alkaline Phosphatase: 50 U/L (ref 38–126)
Anion gap: 9 (ref 5–15)
BUN: 28 mg/dL — ABNORMAL HIGH (ref 8–23)
CO2: 22 mmol/L (ref 22–32)
Calcium: 8.1 mg/dL — ABNORMAL LOW (ref 8.9–10.3)
Chloride: 102 mmol/L (ref 98–111)
Creatinine, Ser: 1.43 mg/dL — ABNORMAL HIGH (ref 0.61–1.24)
GFR, Estimated: 51 mL/min — ABNORMAL LOW (ref >60)
Glucose, Bld: 75 mg/dL (ref 70–99)
Potassium: 3.2 mmol/L — ABNORMAL LOW (ref 3.5–5.1)
Sodium: 133 mmol/L — ABNORMAL LOW (ref 135–145)
Total Bilirubin: 0.7 mg/dL (ref 0.3–1.2)
Total Protein: 6.1 g/dL — ABNORMAL LOW (ref 6.5–8.1)

## 2021-06-10 LAB — LACTATE DEHYDROGENASE: LDH: 260 U/L — ABNORMAL HIGH (ref 98–192)

## 2021-06-10 LAB — IRON AND TIBC
Iron: 38 ug/dL — ABNORMAL LOW (ref 45–182)
Saturation Ratios: 14 % — ABNORMAL LOW (ref 17.9–39.5)
TIBC: 269 ug/dL (ref 250–450)
UIBC: 231 ug/dL

## 2021-06-10 LAB — FERRITIN: Ferritin: 336 ng/mL (ref 24–336)

## 2021-06-10 MED ORDER — HYDRALAZINE HCL 100 MG PO TABS: 100.0000 mg | ORAL_TABLET | Freq: Two times a day (BID) | ORAL | 3 refills | Status: DC

## 2021-06-10 NOTE — Telephone Encounter (Signed)
Hydaralazine should be bid, please make correction in computer and verify with patient.    Zandra Abts MD

## 2021-06-13 ENCOUNTER — Other Ambulatory Visit: Payer: Self-pay | Admitting: Cardiology

## 2021-06-15 ENCOUNTER — Telehealth: Payer: Self-pay | Admitting: *Deleted

## 2021-06-15 ENCOUNTER — Other Ambulatory Visit: Payer: Self-pay | Admitting: *Deleted

## 2021-06-15 MED ORDER — OMEPRAZOLE 20 MG PO CPDR: 20.0000 mg | DELAYED_RELEASE_CAPSULE | Freq: Every day | ORAL | 1 refills | Status: AC | PRN

## 2021-06-15 NOTE — Telephone Encounter (Signed)
Prescription refill request for Eliquis received. Indication:  Atrial fib Last office visit: 04/21/21  Zandra Abts MD Scr: 1.43 on 06/10/21 Age:  75 Weight: 86.2  Based on above findings Eliquis 5mg  twice daily is the appropriate dose.  Refill approved.

## 2021-06-15 NOTE — Chronic Care Management (AMB) (Signed)
  Care Management   Note  06/15/2021 Name: Darius Smith MRN: [id number] DOB: 01-11-1946  Darius Smith is a 75 y.o. year old male who is a primary care patient of [name], MD and is actively engaged with the care management team. I reached out to [name] by phone today to assist with scheduling an initial visit with the Pharmacist and rescheduling initial call with RNCM  Follow up plan: Telephone appointment with care management team member scheduled [id number] 06/17/21 and [facility] 07/02/21  [place] [facility]  Direct Dial: [phone]

## 2021-06-15 NOTE — Chronic Care Management (AMB) (Signed)
  Care Management   Note  06/15/2021 Name: Darius Smith MRN: [id number] DOB: 1945/12/20  Darius Smith is a 75 y.o. year old male who is a primary care patient of [name], MD and is actively engaged with the care management team. I reached out to [name] by phone today to assist with re-scheduling an initial visit with the RN Case Manager  Follow up plan: Unsuccessful telephone outreach attempt made. A HIPAA compliant phone message was left for the patient providing contact information and requesting a return call.  The care management team will reach out to the patient again over the next 7 days.  If patient returns call to provider office, please advise to call [place] at [phone].  [place] [facility]  Direct Dial: [phone]

## 2021-06-16 ENCOUNTER — Telehealth: Payer: Medicare Other

## 2021-06-17 ENCOUNTER — Ambulatory Visit: Payer: Medicare Other | Admitting: Pharmacist

## 2021-06-17 ENCOUNTER — Ambulatory Visit (HOSPITAL_COMMUNITY): Payer: Medicare Other | Admitting: Hematology

## 2021-06-17 DIAGNOSIS — N1832 Chronic kidney disease, stage 3b: Secondary | ICD-10-CM

## 2021-06-17 DIAGNOSIS — I4891 Unspecified atrial fibrillation: Secondary | ICD-10-CM

## 2021-06-17 DIAGNOSIS — C8258 Diffuse follicle center lymphoma, lymph nodes of multiple sites: Secondary | ICD-10-CM

## 2021-06-17 DIAGNOSIS — E119 Type 2 diabetes mellitus without complications: Secondary | ICD-10-CM

## 2021-06-17 DIAGNOSIS — I10 Essential (primary) hypertension: Secondary | ICD-10-CM

## 2021-06-17 DIAGNOSIS — I5022 Chronic systolic (congestive) heart failure: Secondary | ICD-10-CM

## 2021-06-17 NOTE — Chronic Care Management (AMB) (Signed)
Chronic Care Management Pharmacy Note  06/17/2021 Name:  Darius Smith MRN:  [id number] DOB:  [birth year]  Summary:  Steroid Induced Hyperglycemia: Instructed to monitor blood sugars continuously with continuous glucose monitor [facility]) Continue current sliding scale insulin for now given plan for steroid duration of therapy is to be determined. If steroid is discontinued then patient likely will no longer need insulin therapy. If steroid will be continued longer term, then may need to consider basal/bolus insulin regimen. Patient is currently requiring a total of ~9-16 units of insulin per day.   Patient's wife asked if she should be giving the patient insulin overnight if his [facility] alarm goes off for high blood glucose. Discouraged this since patient's fasting blood glucose is within normal limits and giving extra doses of insulin could cause hypoglycemia. Additionally, dicussed that since the patient is using a sliding scale, this will correct for hyperglycemia at the next scheduled dose.   Heart failure with improved ejection fraction (previous LVEF <40% and a follow-up LVEF >50%) - followed by Dr. 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 Would recommend considering lowering dose of furosemide concomitantly  Subjective: Darius Smith is an 75 y.o. year old male who is a primary patient of [name], MD.  The CCM team was consulted for assistance with disease management and care coordination needs.    Engaged with patient's caregiver (spouse) by telephone for initial visit in response to provider referral for pharmacy case management and/or care coordination services.   Consent to Services:  The patient was given the following information about Chronic Care Management services today, agreed to services, and gave verbal consent: 1. CCM service  includes personalized support from designated clinical staff supervised by the primary care provider, including individualized plan of care and coordination with other care providers 2. 24/7 contact phone numbers for assistance for urgent and routine care needs. 3. Service will only be billed when office clinical staff spend 20 minutes or more in a month to coordinate care. 4. Only one practitioner may furnish and bill the service in a calendar month. 5.The patient may stop CCM services at any time (effective at the end of the month) by phone call to the office staff. 6. The patient will be responsible for cost sharing (co-pay) of up to 20% of the service fee (after annual deductible is met). Patient agreed to services and consent obtained.  Patient Care Team: [name], MD as PCP - General (Internal Medicine) Harl Bowie Alphonse Guild, MD as PCP - Cardiology (Cardiology) Brien Mates, RN as Oncology Nurse Navigator (Oncology) Kassie Mends, RN as [place] Management Beryle Lathe, [facility] (Pharmacist)  Objective:  Lab Results  Component Value Date   CREATININE 1.43 (H) 06/10/2021   CREATININE 1.50 (H) 06/05/2021   CREATININE 1.59 (H) 06/04/2021    Lab Results  Component Value Date   HGBA1C 6.5 (H) 06/01/2021   Last diabetic Eye exam: No results found for: HMDIABEYEEXA  Last diabetic Foot exam: No results found for: HMDIABFOOTEX      Component Value Date/Time   CHOL 173 02/11/2021 0856   TRIG 110 02/11/2021 0856   HDL 49 02/11/2021 0856   CHOLHDL 2.9 07/31/2020 1131   LDLCALC 104 (H) 02/11/2021 0856    Hepatic Function Latest Ref Rng & Units 06/10/2021 06/02/2021 06/01/2021  Total Protein 6.5 -  8.1 g/dL 6.1(L) 5.5(L) 6.8  Albumin 3.5 - 5.0 g/dL 2.9(L) 2.7(L) 3.3(L)  AST 15 - 41 U/L _0 ALT 0 - 44 U/L 36 28 33  Alk Phosphatase 38 - 126 U/L 50 40 60  Total Bilirubin 0.3 - 1.2 mg/dL 0.7 1.2 0.8  Bilirubin, Direct 0.0 - 0.3 mg/dL - - -    Lab  Results  Component Value Date/Time   TSH 1.156 08/01/2020 04:08 AM   TSH 1.242 07/06/2020 10:13 AM    CBC Latest Ref Rng & Units 06/10/2021 06/05/2021 06/04/2021  WBC 4.0 - 10.5 K/uL 14.4(H) 14.5(H) 11.4(H)  Hemoglobin 13.0 - 17.0 g/dL 12.9(L) 12.1(L) 12.3(L)  Hematocrit 39.0 - 52.0 % 37.4(L) 34.8(L) 36.4(L)  Platelets 150 - 400 K/uL 167 128(L) 137(L)    No results found for: VD25OH  Clinical ASCVD: No  The 10-year ASCVD risk score (Arnett DK, et al., 2019) is: 41.3%   Values used to calculate the score:     Age: 27 years     Sex: Male     Is Non-Hispanic African American: No     Diabetic: Yes     Tobacco smoker: No     Systolic Blood Pressure: 409 mmHg     Is BP treated: Yes     HDL Cholesterol: 49 mg/dL     Total Cholesterol: 173 mg/dL    Social History   Tobacco Use  Smoking Status Never  Smokeless Tobacco Never   BP Readings from Last 3 Encounters:  06/05/21 118/68  06/02/21 (!) 141/89  06/01/21 93/70   Pulse Readings from Last 3 Encounters:  06/05/21 (!) 52  06/02/21 (!) 56  06/01/21 (!) 47   Wt Readings from Last 3 Encounters:  06/04/21 177 lb 0.5 oz (80.3 kg)  06/01/21 168 lb 9.6 oz (76.5 kg)  06/01/21 168 lb (76.2 kg)    Assessment: Review of patient past medical history, allergies, medications, health status, including review of consultants reports, laboratory and other test data, was performed as part of comprehensive evaluation and provision of chronic care management services.   SDOH:  (Social Determinants of Health) assessments and interventions performed:    CCM Care Plan  Allergies  Allergen Reactions   Augmentin [Amoxicillin-Pot Clavulanate] Other (See Comments)    Increased liver enzymes Has patient had a PCN reaction causing immediate rash, facial/tongue/throat swelling, SOB or lightheadedness with hypotension: No Has patient had a PCN reaction causing severe rash involving mucus membranes or skin necrosis: No Has patient had a PCN  reaction that required hospitalization: No Has patient had a PCN reaction occurring within the last 10 years: No  If all of the above answers are "NO", then may proceed with Cephalosporin use.    Flomax [Tamsulosin Hcl] Nausea Only    Dizziness  Pt is on flomax    Medications Reviewed Today     Reviewed by Beryle Lathe, [facility] (Pharmacist) on 06/17/21 at 0857  Med List Status: <None>   Medication Order Taking? Sig Documenting Provider Last Dose Status Informant  acetaminophen (TYLENOL) 500 MG tablet [id number] Yes Take 1,000 mg by mouth every 6 (six) hours as needed for moderate pain or headache. [provider] Taking Active Spouse/Significant Other  amiodarone (PACERONE) 200 MG tablet [id number] Yes Take 1 tablet (200 mg total) by mouth daily. Fenton, Clint R, PA Taking Active Spouse/Significant Other  amLODipine (NORVASC) 10 MG tablet [id number] Yes Take 1 tablet (10 mg total) by mouth daily. Arnoldo Lenis, MD Taking  Active Spouse/Significant Other  apixaban (ELIQUIS) 5 MG TABS tablet [id number] Yes TAKE ONE TABLET BY MOUTH 2 TIMES A DAY. Arnoldo Lenis, MD Taking Active   blood glucose meter kit and supplies KIT [id number]  Inject 1 each into the skin 4 (four) times daily -  before meals and at bedtime. Dispense based on patient and insurance preference. Use up to four times daily as directed. Barb Merino, MD  Active   cetirizine (ZYRTEC) 10 MG tablet [id number] No Take 1 tablet (10 mg total) by mouth daily as needed for allergies.  Patient not taking: Reported on 06/17/2021   Maryruth Hancock, MD Not Taking Active Spouse/Significant Other  dexamethasone (DECADRON) 2 MG tablet [id number] Yes Take 1 tablet (2 mg total) by mouth 2 (two) times daily.  Patient taking differently: Take 2 mg by mouth daily.   Derek Jack, MD Taking Active Spouse/Significant Other  furosemide (LASIX) 20 MG tablet [id number] Yes Take 1 tablet (20 mg total) by mouth every other day. (MAY  TAKE DAILY AS NEEDED FOR INCREASED SWELLING) Branch, Alphonse Guild, MD Taking Active Spouse/Significant Other  hydrALAZINE (APRESOLINE) 100 MG tablet [id number] Yes Take 1 tablet (100 mg total) by mouth 2 (two) times daily. Arnoldo Lenis, MD Taking Active   insulin lispro (HUMALOG KWIKPEN) 100 UNIT/ML KwikPen [id number] Yes Inject 1-10 Units into the skin 4 (four) times daily -  before meals and at bedtime. Blood sugars 70-120: 1 unit 121-150: 2 units 151-200: 3 units 201-250: 4 units 251-300: 5 units 301-350: 6 units 351-400: 9 units More than 400, 10 units and call your doctor Barb Merino, MD Taking Active   Insulin Pen Needle 29G X 12MM MISC [id number] Yes 1 each by Does not apply route 4 (four) times daily -  before meals and at bedtime. Barb Merino, MD Taking Active   isosorbide mononitrate (IMDUR) 30 MG 24 hr tablet [id number] Yes TAKE 1/2 TABLET BY MOUTH ONCE DAILY. Arnoldo Lenis, MD Taking Active Spouse/Significant Other  metoprolol succinate (TOPROL XL) 25 MG 24 hr tablet [id number] Yes Take 1 tablet (25 mg total) by mouth daily. Arnoldo Lenis, MD Taking Active Spouse/Significant Other  omeprazole (PRILOSEC) 20 MG capsule [id number] Yes Take 1 capsule (20 mg total) by mouth daily as needed (reflux). [name], MD Taking Active   VENTOLIN HFA [phone] Base) MCG/ACT inhaler [id number] Yes Inhale 2 puffs into the lungs every 6 (six) hours as needed for wheezing or shortness of breath. Fayrene Helper, MD Taking Active Spouse/Significant Other            Patient Active Problem List   Diagnosis Date Noted   Brain metastases [facility])    Hyperkalemia 06/01/2021   Secondary hypercoagulable state ([place]) 08/27/2020   Palliative care by specialist    DNR (do not resuscitate) discussion    Acute on chronic renal failure ([place]) 08/03/2020   Stage 3b chronic kidney disease ([place]) 49/44/9675   Chronic systolic HF (heart failure) ([place]) 91/63/8466   Follicular lymphoma ([place]) 06/11/2020    Atrial fibrillation ([place]) 03/20/2020   Essential hypertension 08/08/2014   GERD (gastroesophageal reflux disease) 08/08/2014    Immunization History  Administered Date(s) Administered   Fluad Quad(high Dose 65+) 06/04/2021   Influenza Nasal 06/23/2020   Influenza, High Dose Seasonal PF 05/30/2019   Influenza-Unspecified 06/21/2017, 07/03/2018, 05/30/2019, 06/23/2020   PFIZER(Purple Top)SARS-COV-2 Vaccination 11/11/2019, 12/05/2019, 07/03/2020, 07/03/2020   Pneumococcal Polysaccharide-23 06/04/2019   Tdap 04/18/2014   Zoster, Live 06/26/2013  Conditions to be addressed/monitored: Atrial Fibrillation, CHF, HTN, DMII, CKD Stage 3a, and cancer  Care Plan : Medication Management  Updates made by Beryle Lathe, [place] since 06/17/2021 12:00 AM     Problem: Hyperglycemia, Hypertension, Afib, HFimpEF, CKD3a, Cancer   Priority: High  Onset Date: 06/17/2021     Long-Range Goal: Disease Progression Prevention   Start Date: 06/17/2021  Expected End Date: 09/15/2021  This Visit's Progress: On track  Priority: High  Note:   Current Barriers:  Unable to independently monitor therapeutic efficacy Unable to achieve control of diabetes  Pharmacist Clinical Goal(s):  Over the next 90 days, patient will Achieve adherence to monitoring guidelines and medication adherence to achieve therapeutic efficacy Achieve control of diabetes as evidenced by improved fasting blood sugar and improved A1c through collaboration with PharmD and provider.   Interventions: 1:1 collaboration with [name], MD regarding development and update of comprehensive plan of care as evidenced by provider attestation and co-signature Inter-disciplinary care team collaboration (see longitudinal plan of care) Comprehensive medication review performed; medication list updated in electronic medical record  Steroid Induced Hyperglycemia: Patient is taking dexamethasone 2 mg by mouth daily as part of cancer  treatment Controlled; Most recent A1c at goal of <7% per ADA guidelines Current medications: insulin lispro (Humalog) sliding scale insulin 1-10 units four times daily (with meals and at bedtime) Taking medications as directed: yes Denies hypoglycemic symptoms (sweaty and shaky). Hypoglycemia prevention: not indicated at this time Current meal patterns:  oatmeal or cereal for breakfast; has stopped drinking juice Current exercise: not discussed today On a statin: no On aspirin 81 mg daily: no Current glucose readings: fasting blood glucose: within goal range of 80-130 mg/dL per ADA guidelines. pre-prandial blood glucose: above goal of <140 mg/dL per ADA guidelines. post prandial glucose: above goal of <180 mg/dL per ADA guidelines. Uses Freestyle libre to monitor blood glucose (range over last week): fasting 84-124, before lunch: 119-256, bedtime: 160-267 (331 as an outlier) Instructed to monitor blood sugars continuously with continuous glucose monitor  Continue current sliding scale insulin for now given plan for steroid duration of therapy is to be determined. If steroid is discontinued then patient likely will no longer need insulin therapy. If steroid will be continued longer term, then may need to consider basal/bolus insulin regimen. Patient is currently requiring a total of ~9-16 units of insulin per day.   Patient's wife asked if she should be giving the patient insulin overnight if his [facility] alarm goes off for high blood glucose. Discouraged this since patient's fasting blood glucose is within normal limits and giving extra doses of insulin could cause hypoglycemia. Additionally, dicussed that since the patient is using a sliding scale, this will correct for hyperglycemia at the next scheduled dose.   Hypertension: Blood pressure under good control. Blood pressure is at goal of <130/80 mmHg per 2017 AHA/ACC guidelines. Current medications: amlodipine 10 mg by mouth once daily,  metoprolol succinate 25 mg by mouth once daily, hydralazine 100 mg by mouth twice daily, and furosemide 20 mg by mouth daily (may take an extra 20 mg if extra fluid overload) Intolerances: none Taking medications as directed: yes Side effects thought to be attributed to current medication regimen: no Current home blood pressure: unknown Encourage dietary sodium restriction/DASH diet Recommend home blood pressure monitoring to discuss at next visit Continue current medications as above  Heart failure with improved ejection fraction (previous LVEF <40% and a follow-up LVEF >50%) - followed by  Dr. Harl Bowie Appropriately managed Current treatment: metoprolol succinate 25 mg by mouth once daily and furosemide 20 mg by mouth daily (may take an extra 20 mg if extra fluid overload) Stage C (Symptomatic heart failure)/NYHA Class II (Slight limitation of physical activity. Comfortable at rest. Ordinary physical activity results in fatigue, palpitation, or dyspnea) Most recent echocardiogram: LVEF 50% (11/26/20) which was improved from LVEF 20-25% (06/12/20) Current home blood pressure: unknown Current home weight: unknown Encourage dietary sodium restriction (<3 g/day) Educated on the importance of weighing daily. Patient aware to contact cardiology/primary care team if weight gain >3 lbs in 1 day or >5 lbs in 1 week Follow-up with cardiology Continue current medications as above Consider addition of SGLT-2 inhibitor such as [place] or [place]. Would recommend considering lowering dose of furosemide concomitantly  Atrial Fibrillation - followed by Dr. Harl Bowie Controlled. Most recent ECG: sinus bradycardia. Current rate and rhythm control: metoprolol succinate 25 mg by mouth once daily and amiodarone 200 mg by mouth once daily Anticoagulation: apixaban (Eliquis) 5 mg by mouth twice daily  CHADS2VASc score: 5 - Age (2 points), Heart failure history (1 point), Hypertension history (1 point), and Diabetes  history (1 point) Prior ablation: no History of cardioversion:  yes, multiple Denies signs and symptoms of bleeding Current home blood pressure: unknown; Current home heart rate: unknown Continue metoprolol succinate 25 mg by mouth once daily and amiodarone 200 mg by mouth once daily Continue apixaban (Eliquis) 5 mg by mouth twice daily  Recommend home blood pressure and heart rate monitoring to discuss at next visit Discussed need for medication compliance  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 Would recommend considering lowering dose of furosemide concomitantly  Cancer (Followed by Dr. 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 Long term plan with steroid to be determined. Will continue to follow.  Patient reports that he is not in pain  Patient Goals/Self-Care Activities Over the next 90 days, patient will:  Take medications as prescribed Check blood sugar continuously with continuous glucose monitor, document, and provide at future appointments Check blood pressure at least once daily, document, and provide at future appointments Weigh daily, and contact provider if weight gain of more than 3 lbs in 1 day or more than 5 lbs in 1 week  Follow Up Plan: Face to Face appointment with care management team member scheduled for: 07/15/21      Medication Assistance: None required.  Patient  affirms current coverage meets needs.  Patient's preferred pharmacy is:  [facility] [address] [place] [address] Phone: [phone] Fax: [phone]  Follow Up:  Patient agrees to Care Plan and Follow-up.  Plan: Face to Face appointment with care management team member scheduled for: 07/15/21  [name], PharmD Clinical Pharmacist [facility] [phone]

## 2021-06-17 NOTE — Patient Instructions (Signed)
It was great to talk to you today!  Please call me with any questions or concerns.   Visit Information   PATIENT GOALS:   Goals Addressed             This Visit's Progress    Medication Management       Patient Goals/Self-Care Activities Over the next 90 days, patient will:  Take medications as prescribed Check blood sugar continuously with continuous glucose monitor, document, and provide at future appointments Check blood pressure at least once daily, document, and provide at future appointments Weigh daily, and contact provider if weight gain of more than 3 lbs in 1 day or more than 5 lbs in 1 week        Consent to CCM Services: Darius Smith was given information about Chronic Care Management services including:  CCM service includes personalized support from designated clinical staff supervised by his physician, including individualized plan of care and coordination with other care providers 24/7 contact phone numbers for assistance for urgent and routine care needs. Service will only be billed when office clinical staff spend 20 minutes or more in a month to coordinate care. Only one practitioner may furnish and bill the service in a calendar month. The patient may stop CCM services at any time (effective at the end of the month) by phone call to the office staff. The patient will be responsible for cost sharing (co-pay) of up to 20% of the service fee (after annual deductible is met).  Patient agreed to services and verbal consent obtained.   Patient verbalizes understanding of instructions provided today and agrees to view in [place].   Face to Face appointment with care management team member scheduled for: 07/15/21  [name], PharmD Clinical Pharmacist [facility] [phone]  CLINICAL CARE PLAN: Patient Care Plan: Post hospitalization     Problem Identified: Care coordination related to recent hospitalization   Priority: High      Long-Range Goal: Care coordination needs met   Start Date: 06/08/2021  Expected End Date: 08/19/2021  This Visit's Progress: On track  Priority: High  Note:   Current Barriers:  Care coordination needs related to recent hospitalization:  Telephone call to patient. Wife/ HPOA, answered call and verified HIPAA for patient.  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  Hospitalized 06/01/2021 due to elevated potassium.  Discharged 06/02/2021 then readmitted on 06/03/2021 through 06/05/2021 for lymph node biopsy.  Wife reports she assists patient with blood sugar checks and insulin administration.  Wife concerned she is having to stick patient several times to get a good blood flow for blood sugar checks. Wife states patient has been advised to check blood sugars 4 times per day.  RNCM advised wife to contact patients insurance carrier to determine if a free style libre or DexCom would be covered.  Discussed with wife ways to promote good blood flow to fingers for blood sugar checks.    Wife reports patient has a follow up visit with the oncologist on 06/17/2021, follow up with the general surgeon and A-fib clinic on 06/18/2021.   Wife reports patient is able to afford his medications and takes them as prescribed.  She reports patient resumed his Eliquis today as advised by his doctor.  He has transportation to appointments. RNCM discussed with wife, signs/ symptoms of infection related to biopsy site, Safety precautions due to patient having 2 recent falls, Hyper/Hypoglycemia management.  Case manager Clinical Goal(s): Patient will attend scheduled  medical appointments Patient will take medications as prescribed Patient will follow up with providers as recommended.  Patient/ spouse will monitor for signs/ symptoms of infection to surgical site Patient/ spouse will follow falls safety precautions Patient will monitor for hyper/ hypoglycemic  Work with the care management team to address  educational, disease management, and care coordination needs  Call provider office for new or worsened signs and symptoms  Call care management team with questions or concerns Verbalize basic understanding of patient centered plan of care by provider Interventions: Evaluation of current treatment plans and patient's adherence to plan as established by provider Assessed patient spouse understanding of disease state Assessed patient's education and care coordination needs Provided disease specific education to patient/ spouse  Contact insurance provider to determine coverage options for Free Style libre/ DexCom Follow fall safety precautions Take medications as prescribed and renew timely Contact provider for signs/symptoms of infection, new or ongoing symptoms.  Follow up with providers as recommended Follow Up Plan: The patient has been provided with contact information for the care management team and has been advised to call with any health related questions or concerns.  The care management team will reach out to the patient again over the next 30 days.             Patient Care Plan: Medication Management     Problem Identified: Hyperglycemia, Hypertension, Afib, HFimpEF, CKD3a, Cancer   Priority: High  Onset Date: 06/17/2021     Long-Range Goal: Disease Progression Prevention   Start Date: 06/17/2021  Expected End Date: 09/15/2021  This Visit's Progress: On track  Priority: High  Note:   Current Barriers:  Unable to independently monitor therapeutic efficacy Unable to achieve control of diabetes  Pharmacist Clinical Goal(s):  Over the next 90 days, patient will Achieve adherence to monitoring guidelines and medication adherence to achieve therapeutic efficacy Achieve control of diabetes as evidenced by improved fasting blood sugar and improved A1c through collaboration with PharmD and provider.   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 Hypoglycemia prevention: not indicated at this time Current meal patterns:  oatmeal or cereal for breakfast; has stopped drinking juice Current exercise: not discussed today On a statin: no On aspirin 81 mg daily: no Current glucose readings: fasting blood glucose: within goal range of 80-130 mg/dL per ADA guidelines. pre-prandial blood glucose: above goal of <140 mg/dL per ADA guidelines. post prandial glucose: above goal of <180 mg/dL per ADA guidelines. Uses Freestyle libre to monitor blood glucose (range over last week): fasting 84-124, before lunch: 119-256, bedtime: 160-267 (331 as an outlier) Instructed to monitor blood sugars continuously with continuous glucose monitor  Continue current sliding scale insulin for now given plan for steroid duration of therapy is to be determined. If steroid is discontinued then patient likely will no longer need insulin therapy. If steroid will be continued longer term, then may need to consider basal/bolus insulin regimen. Patient is currently  requiring a total of ~9-16 units of insulin per day.   Patient's wife asked if she should be giving the patient insulin overnight if his [facility] alarm goes off for high blood glucose. Discouraged this since patient's fasting blood glucose is within normal  limits and giving extra doses of insulin could cause hypoglycemia. Additionally, dicussed that since the patient is using a sliding scale, this will correct for hyperglycemia at the next scheduled dose.   Hypertension: Blood pressure under good control. Blood pressure is at goal of <130/80 mmHg per 2017 AHA/ACC guidelines. 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 Harl Bowie Appropriately managed Current treatment: metoprolol succinate 25 mg by mouth once daily and furosemide 20 mg by mouth daily (may take an extra 20 mg if extra fluid overload) Stage C (Symptomatic heart failure)/NYHA Class II (Slight limitation of physical activity. Comfortable at rest. Ordinary physical activity results in fatigue, palpitation, or dyspnea) Most recent echocardiogram: LVEF 50% (11/26/20) which was improved from LVEF 20-25% (06/12/20) Current home blood pressure: unknown Current home weight: unknown Encourage dietary sodium restriction (<3 g/day) Educated on the importance of weighing daily. Patient aware to contact cardiology/primary care team if weight gain >3 lbs in 1 day or >5 lbs in 1 week Follow-up with cardiology Continue current medications as above Consider addition of SGLT-2 inhibitor such as [place] or [place]. Would recommend considering lowering dose of furosemide concomitantly  Atrial  Fibrillation - followed by Dr. Harl Bowie Controlled. Most recent ECG: sinus bradycardia. Current rate and rhythm control: metoprolol succinate 25 mg by mouth once daily and amiodarone 200 mg by mouth once daily Anticoagulation: apixaban (Eliquis) 5 mg by mouth twice daily  CHADS2VASc score: 5 - Age (2 points), Heart failure history (1 point), Hypertension history (1 point), and Diabetes history (1 point) Prior ablation: no History of cardioversion:  yes, multiple Denies signs and symptoms of bleeding Current home blood pressure: unknown; Current home heart rate: unknown Continue metoprolol succinate 25 mg by mouth once daily and amiodarone 200 mg by mouth once daily Continue apixaban (Eliquis) 5 mg by mouth twice daily  Recommend home blood pressure and heart rate monitoring to discuss at next visit Discussed need for medication compliance  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 Would recommend considering lowering dose of furosemide concomitantly  Cancer (Followed by Dr. 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 Long term plan with steroid to be determined. Will continue to follow.  Patient reports that he is not in pain  Patient Goals/Self-Care Activities Over the next 90 days, patient will:  Take medications as prescribed Check blood sugar continuously with continuous glucose monitor,  document, and provide at future appointments Check blood pressure at least once daily, document, and provide at future appointments Weigh daily, and contact provider if weight gain of more than 3 lbs in 1 day or more than 5 lbs in 1 week  Follow Up Plan: Face to Face appointment with care management team member scheduled for: 07/15/21

## 2021-06-17 NOTE — Progress Notes (Signed)
Primary Care Physician: [name], MD Primary Cardiologist: Dr Harl Bowie Primary Electrophysiologist: none Referring Physician: Ermalinda Barrios   Darius Smith is a 75 y.o. male with a history of chronic systolic CHF, HTN, DM, stage IV lymphoma, and persistent atrial fibrillation who presents for follow up in the [facility].  The patient was initially diagnosed with atrial fibrillation 2019 in the setting of a urology procedure. He was found to be back in afib on follow up 06/23/20 with a new cardiomyopathy, EF 20-25%. Patient is on Eliquis for a CHADS2VASC score of 3. He was admitted 07/14/20 for afib RVR with acute CHF. DCCV on 07/16/20 was unsuccessful and he was loaded on amiodarone. He underwent repeat DCCV on 08/21/20 which was initially successful but was back in afib on follow up12/6/21. Patient is mostly unaware of his afib with only mild palpitations. He denies any alcohol use. He has snored all his life 2/2 h/o cleft palate, no witnessed apnea or daytime somnolence. Patient is s/p DCCV on 09/10/20.   On follow up today, patient reports that he feels well today. 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 He has an appointment at [facility] to discuss plans. He has not had any heart racing or palpitations. He denies any bleeding issues on anticoagulation.   Today, he denies symptoms of palpitations, chest pain, shortness of breath, orthopnea, PND, lower extremity edema, presyncope, syncope, daytime somnolence, bleeding, or neurologic sequela. The patient is tolerating medications without difficulties and is otherwise without complaint today.    Atrial Fibrillation Risk Factors:  he does not have symptoms or diagnosis of sleep apnea. he does not have a history of rheumatic fever. he does not have a history of alcohol use. The patient does not have a history of early familial atrial fibrillation or other arrhythmias.  he has a BMI of Body  mass index is 23.73 kg/m.Marland Kitchen Filed Weights   06/18/21 0910  Weight: 79.4 kg     Family History  Problem Relation Age of Onset   Breast cancer Mother    Prostate cancer Father    COPD Sister      Atrial Fibrillation Management history:  Previous antiarrhythmic drugs: amiodarone Previous cardioversions: 07/16/20, 08/21/20, 09/10/20 Previous ablations: none CHADS2VASC score: 3 Anticoagulation history: Eliquis   Past Medical History:  Diagnosis Date   Allergic rhinitis 07/02/2019   Arthritis    both hands   Atrial fibrillation ([place]) 10/28/2017   Dr. Harl Bowie   Atrial fibrillation with RVR ([place]) 07/14/2020   Blood in urine    occ   Bronchitis    Cancer ([place])    Basal cell   Cardiomegaly 10/28/2017   CHF (congestive heart failure) ([place])    Complication of anesthesia    Mr. Ohm developed a. fib in recovery after cysto procedure 10/2017 was transferred to [facility] and underwent successful cardioversion   Diverticulosis of sigmoid colon 08/2017   Noted on colonoscopy   Dysrhythmia    External hemorrhoids 08/2017   GERD (gastroesophageal reflux disease)    History of colon polyps 08/2017   History of kidney stones    History of rectal bleeding    History of right inguinal hernia    Hypertension    Lung cancer ([place]) 06/25/2020   Lymphoma ([place])    Nephrolithiasis 07/02/2019   Rectal bleeding 09/02/2017   Added automatically from request for surgery [id number]   Shortness of breath    Stage 3 chronic kidney  disease [facility])    Past Surgical History:  Procedure Laterality Date   AXILLARY LYMPH NODE BIOPSY Left 06/05/2021   Procedure: AXILLARY LYMPH NODE BIOPSY;  Surgeon: Virl Cagey, MD;  Location: [facility] ORS;  Service: General;  Laterality: Left;   CARDIOVERSION     10/2017   CARDIOVERSION N/A 07/16/2020   Procedure: CARDIOVERSION;  Surgeon: Arnoldo Lenis, MD;  Location: [facility] ENDO SUITE;  Service: Endoscopy;  Laterality: N/A;   CARDIOVERSION N/A 08/21/2020   Procedure:  CARDIOVERSION;  Surgeon: Satira Sark, MD;  Location: [facility] ORS;  Service: Cardiovascular;  Laterality: N/A;   CARDIOVERSION N/A 09/10/2020   Procedure: CARDIOVERSION;  Surgeon: Josue Hector, MD;  Location: [facility] ENDOSCOPY;  Service: Cardiovascular;  Laterality: N/A;   CHOLECYSTECTOMY     CLEFT LIP REPAIR     several from childhood til 75 years old   [place]     several from childhood til 75 years old   COLONOSCOPY  04/28/2011   Procedure: COLONOSCOPY;  Surgeon: Rogene Houston, MD;  Location: [facility] ENDO SUITE;  Service: Endoscopy;  Laterality: N/A;   COLONOSCOPY N/A 07/18/2014   Procedure: COLONOSCOPY;  Surgeon: Rogene Houston, MD;  Location: [facility] ENDO SU[facility];  Service: Endoscopy;  Laterality: N/A;  830   COLONOSCOPY N/A 09/09/2017   Procedure: COLONOSCOPY;  Surgeon: Rogene Houston, MD;  Location: [facility] ENDO SUITE;  Service: Endoscopy;  Laterality: N/A;  955   CYSTOSCOPY/URETEROSCOPY/HOLMIUM LASER/STENT PLACEMENT Bilateral 12/06/2017   Procedure: CYSTOSCOPY/RETROGRADE/URETEROSCOPY/HOLMIUM LASER/STENT EXCHANGE;  Surgeon: Festus Aloe, MD;  Location: [facility] ORS;  Service: Urology;  Laterality: Bilateral;  ONLY NEEDS 60 MIN   HERNIA REPAIR     POLYPECTOMY  09/09/2017   Procedure: POLYPECTOMY;  Surgeon: Rogene Houston, MD;  Location: [facility] ENDO SUITE;  Service: Endoscopy;;  colon   vocal cord surgery      Current Outpatient Medications  Medication Sig Dispense Refill   acetaminophen (TYLENOL) 500 MG tablet Take 1,000 mg by mouth every 6 (six) hours as needed for moderate pain or headache.     amiodarone (PACERONE) 200 MG tablet Take 1 tablet (200 mg total) by mouth daily. 60 tablet 6   amLODipine (NORVASC) 10 MG tablet Take 1 tablet (10 mg total) by mouth daily. 90 tablet 3   apixaban (ELIQUIS) 5 MG TABS tablet TAKE ONE TABLET BY MOUTH 2 TIMES A DAY. 60 tablet 5   blood glucose meter kit and supplies KIT Inject 1 each into the skin 4 (four) times daily -  before meals and at bedtime.  Dispense based on patient and insurance preference. Use up to four times daily as directed. 1 each 0   cetirizine (ZYRTEC) 10 MG tablet Take 1 tablet (10 mg total) by mouth daily as needed for allergies. 90 tablet 2   Continuous Blood Gluc Receiver (FREESTYLE LIBRE 2 READER) DEVI Inject 1 each into the skin in the morning, at noon, in the evening, and at bedtime.     Continuous Blood Gluc Sensor (FREESTYLE LIBRE 2 SENSOR) MISC Inject 1 each into the skin in the morning, at noon, in the evening, and at bedtime.     dexamethasone (DECADRON) 2 MG tablet Take 1 tablet (2 mg total) by mouth 2 (two) times daily. (Patient taking differently: Take 2 mg by mouth daily.) 60 tablet 0   furosemide (LASIX) 20 MG tablet Take 1 tablet (20 mg total) by mouth every other day. (MAY TAKE DAILY AS NEEDED FOR INCREASED SWELLING)  hydrALAZINE (APRESOLINE) 100 MG tablet Take 1 tablet (100 mg total) by mouth 2 (two) times daily. 180 tablet 3   insulin lispro (HUMALOG KWIKPEN) 100 UNIT/ML KwikPen Inject 1-10 Units into the skin 4 (four) times daily -  before meals and at bedtime. Blood sugars 70-120: 1 unit 121-150: 2 units 151-200: 3 units 201-250: 4 units 251-300: 5 units 301-350: 6 units 351-400: 9 units More than 400, 10 units and call your doctor 15 mL 11   Insulin Pen Needle 29G X 12MM MISC 1 each by Does not apply route 4 (four) times daily -  before meals and at bedtime. 100 each 0   isosorbide mononitrate (IMDUR) 30 MG 24 hr tablet TAKE 1/2 TABLET BY MOUTH ONCE DAILY. 90 tablet 3   Lancets (ONETOUCH DELICA PLUS [id number]) MISC 1 each by Other route 4 (four) times daily.     metoprolol succinate (TOPROL XL) 25 MG 24 hr tablet Take 1 tablet (25 mg total) by mouth daily. 90 tablet 3   omeprazole (PRILOSEC) 20 MG capsule Take 1 capsule (20 mg total) by mouth daily as needed (reflux). 90 capsule 1   ONETOUCH ULTRA test strip 1 each by Other route 4 (four) times daily.     VENTOLIN HFA 108 (90 Base) MCG/ACT inhaler  Inhale 2 puffs into the lungs every 6 (six) hours as needed for wheezing or shortness of breath. 18 g 3   No current facility-administered medications for this encounter.    Allergies  Allergen Reactions   Augmentin [Amoxicillin-Pot Clavulanate] Other (See Comments)    Increased liver enzymes Has patient had a PCN reaction causing immediate rash, facial/tongue/throat swelling, SOB or lightheadedness with hypotension: No Has patient had a PCN reaction causing severe rash involving mucus membranes or skin necrosis: No Has patient had a PCN reaction that required hospitalization: No Has patient had a PCN reaction occurring within the last 10 years: No  If all of the above answers are "NO", then may proceed with Cephalosporin use.    Flomax [Tamsulosin Hcl] Nausea Only    Dizziness  Pt is on flomax    Social History   Socioeconomic History   Marital status: Married    Spouse name: Not on file   Number of children: 2   Years of education: Not on file   Highest education level: Not on file  Occupational History   Occupation: retired  Tobacco Use   Smoking status: Never   Smokeless tobacco: Never  Vaping Use   Vaping Use: Never used  Substance and Sexual Activity   Alcohol use: Not Currently    Alcohol/week: 7.0 standard drinks    Types: 7 Cans of beer per week   Drug use: No   Sexual activity: Not on file  Other Topics Concern   Not on file  Social History Narrative   Lives with wife [name] of 53 years April 15, 2020      2 children, 2 grandchildren   Dog: Gardner Candle      Enjoys: being outdoors       Diet: eats all food groups    Caffeine: drink coffee in the morning, some tea, diet dr pepper   Water: 3-4 cups daily         Wears seat belt    Does not use phone while driving   Smoke detectors   Rifle put up not loaded          Social Determinants of Health  Financial Resource Strain: Low Risk    Difficulty of Paying Living Expenses: Not hard at all   Food Insecurity: No Food Insecurity   Worried About Charity fundraiser in the Last Year: Never true   Ran Out of Food in the Last Year: Never true  Transportation Needs: No Transportation Needs   Lack of Transportation (Medical): No   Lack of Transportation (Non-Medical): No  Physical Activity: Sufficiently Active   Days of Exercise per Week: 7 days   Minutes of Exercise per Session: 50 min  Stress: No Stress Concern Present   Feeling of Stress : Not at all  Social Connections: Moderately Integrated   Frequency of Communication with Friends and Family: More than three times a week   Frequency of Social Gatherings with Friends and Family: More than three times a week   Attends Religious Services: More than 4 times per year   Active Member of [employer] or Organizations: No   Attends Archivist Meetings: Never   Marital Status: Married  Human resources officer Violence: Not At Risk   Fear of Current or Ex-Partner: No   Emotionally Abused: No   Physically Abused: No   Sexually Abused: No     ROS- All systems are reviewed and negative except as per the HPI above.  Physical Exam: Vitals:   06/18/21 0910  BP: 110/66  Pulse: (!) 53  Weight: 79.4 kg  Height: 6' (1.829 m)    GEN- The patient is a well appearing elderly male, alert and oriented x 3 today.   HEENT-head normocephalic, atraumatic, sclera clear, conjunctiva pink, hearing intact, trachea midline. Lungs- Clear to ausculation bilaterally, normal work of breathing Heart- Regular rate and rhythm, bradycardia, no murmurs, rubs or gallops  GI- soft, NT, ND, + BS Extremities- no clubbing, cyanosis, or edema MS- no significant deformity or atrophy Skin- no rash or lesion Psych- euthymic mood, full affect Neuro- strength and sensation are intact   Wt Readings from Last 3 Encounters:  06/18/21 79.4 kg  06/04/21 80.3 kg  06/01/21 76.5 kg    EKG today demonstrates  SB, NST Vent. rate 53 BPM PR interval 188 ms QRS  duration 114 ms QT/QTcB 472/442 ms  Echo 11/26/20 demonstrated   1. Left ventricular ejection fraction, by estimation, is 50%. The left  ventricle has mildly decreased function. The left ventricle has no  regional wall motion abnormalities. The left ventricular internal cavity size was mildly dilated. Left ventricular diastolic parameters are consistent with Grade I diastolic dysfunction (impaired relaxation). The average left ventricular global longitudinal strain is -18.2 %. The global longitudinal strain is normal.   2. Right ventricular systolic function is normal. The right ventricular  size is normal.   3. Left atrial size was severely dilated.   4. Right atrial size was mildly dilated.   5. The mitral valve is normal in structure. No evidence of mitral valve  regurgitation. No evidence of mitral stenosis.   6. The aortic valve was not well visualized. There is mild calcification  of the aortic valve. There is mild thickening of the aortic valve. Aortic  valve regurgitation is mild. Mild aortic valve stenosis.   7. The inferior vena cava is normal in size with greater than 50%  respiratory variability, suggesting right atrial pressure of 3 mmHg.   Comparison(s): Echocardiogram done 06/12/20 showed an EF of 20-25%.   Epic records are reviewed at length today  CHA2DS2-VASc Score = 5  The patient's score is based upon:  CHF History: 1 HTN History: 1 Diabetes History: 1 Stroke History: 0 Vascular Disease History: 0 Age Score: 2 Gender Score: 0       ASSESSMENT AND PLAN: 1. Persistent Atrial Fibrillation (ICD10:  I48.19) The patient's CHA2DS2-VASc score is 5, indicating a 7.2% annual risk of stroke.   Patient appears to be maintaining SR. Continue Toprol 100 mg daily Continue amiodarone 200 mg daily. Recent labs reviewed. Check TSH today. Continue Eliquis 5 mg BID  2. Secondary Hypercoagulable State (ICD10:  D68.69) The patient is at significant risk for  stroke/thromboembolism based upon his CHA2DS2-VASc Score of 5.  Continue Apixaban (Eliquis).   3. Chronic systolic CHF EF improved to 50%.  No signs or symptoms of fluid overload.  4. 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   Follow up in the AF clinic in 6 months.    [facility] [address] [place], [place] [address] [phone] 06/18/2021 9:54 AM

## 2021-06-18 ENCOUNTER — Other Ambulatory Visit: Payer: Self-pay

## 2021-06-18 ENCOUNTER — Ambulatory Visit (INDEPENDENT_AMBULATORY_CARE_PROVIDER_SITE_OTHER): Payer: Medicare Other | Admitting: General Surgery

## 2021-06-18 ENCOUNTER — Ambulatory Visit (HOSPITAL_COMMUNITY)
Admission: RE | Admit: 2021-06-18 | Discharge: 2021-06-18 | Disposition: A | Discharge status: Home / Self Care | Payer: Medicare Other | Source: Ambulatory Visit | Attending: Physician Assistant | Admitting: Physician Assistant

## 2021-06-18 ENCOUNTER — Encounter: Payer: Self-pay | Admitting: General Surgery

## 2021-06-18 VITALS — BP 106/68 | HR 59 | Temp 98.3°F | Resp 14 | Ht 72.0 in | Wt 176.0 lb

## 2021-06-18 VITALS — BP 110/66 | HR 53 | Ht 72.0 in | Wt 175.0 lb

## 2021-06-18 DIAGNOSIS — Z888 Allergy status to other drugs, medicaments and biological substances status: Secondary | ICD-10-CM | POA: Insufficient documentation

## 2021-06-18 DIAGNOSIS — C8258 Diffuse follicle center lymphoma, lymph nodes of multiple sites: Secondary | ICD-10-CM

## 2021-06-18 DIAGNOSIS — I5022 Chronic systolic (congestive) heart failure: Secondary | ICD-10-CM | POA: Insufficient documentation

## 2021-06-18 DIAGNOSIS — I110 Hypertensive heart disease with heart failure: Secondary | ICD-10-CM | POA: Insufficient documentation

## 2021-06-18 DIAGNOSIS — Z7901 Long term (current) use of anticoagulants: Secondary | ICD-10-CM | POA: Insufficient documentation

## 2021-06-18 DIAGNOSIS — I4819 Other persistent atrial fibrillation: Secondary | ICD-10-CM | POA: Insufficient documentation

## 2021-06-18 DIAGNOSIS — Z880 Allergy status to penicillin: Secondary | ICD-10-CM | POA: Diagnosis not present

## 2021-06-18 DIAGNOSIS — D6869 Other thrombophilia: Secondary | ICD-10-CM | POA: Insufficient documentation

## 2021-06-18 DIAGNOSIS — Z79899 Other long term (current) drug therapy: Secondary | ICD-10-CM | POA: Diagnosis not present

## 2021-06-18 DIAGNOSIS — Z794 Long term (current) use of insulin: Secondary | ICD-10-CM | POA: Insufficient documentation

## 2021-06-18 DIAGNOSIS — C8580 Other specified types of non-Hodgkin lymphoma, unspecified site: Secondary | ICD-10-CM | POA: Diagnosis not present

## 2021-06-18 DIAGNOSIS — E119 Type 2 diabetes mellitus without complications: Secondary | ICD-10-CM | POA: Insufficient documentation

## 2021-06-18 LAB — TSH: TSH: 1.441 u[IU]/mL (ref 0.350–4.500)

## 2021-06-18 NOTE — Progress Notes (Signed)
[facility]   Healing. No redness or drainage form the left axilla. Feeling good. Never had pain.   BP 106/68   Pulse (!) 59   Temp 98.3 F (36.8 C) (Other (Comment))   Resp 14   Ht 6' (1.829 m)   Wt 176 lb (79.8 kg)   SpO2 96%   BMI 23.87 kg/m  Incision with minor bruising and dermabond, no erythema or drainage, left axilla   Pathology: FINAL MICROSCOPIC DIAGNOSIS:   A. LYMPH NODE, AXILLA, LEFT, RESECTION:  -Low-grade B-cell lymphoma  -See comment   COMMENT:   The sections show effacement of the architecture predominantly by a vaguely nodular lymphoproliferative process characterized by a monomorphic population of primarily small lymphoid cells displaying high nuclear cytoplasmic ratio, round to irregular nuclei, dense chromatin and small to inconspicuous nucleoli. This is associated with fibrosis in the background. Areas of diffuse proliferation and extension into the perinodal adipose tissue are also seen.  Flow cytometric analysis was performed [facility] [phone]) and shows a monoclonal lambda-restricted B-cell population with CD10 expression.  In addition, immunohistochemical stains for Bcl-2, BCL6, CD3, CD5, CD10, CD20, PAX5, CD21, CD79a, cyclin D1 and Ki-67 were performed on block A1 with appropriate controls.  The lymphoproliferative process is primarily composed of B-cells as seen with CD20, CD79a and PAX5 associated with CD10, Bcl-2 and BCL6 expression.  CD21 only highlights scattered small foci representing follicular dendritic networks.  No significant staining is seen with cyclin D1.  There is an admixed T-cell population to lesser extent as seen with CD3 and CD5 and there is no apparent CD5 co-expression in B-cell areas.  Ki-67 shows very low expression (less than 5%).  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    Patient with  lymphoma, Dr. Delton Coombes has referred him to [facility] for next week.  PRN follow up here.  Future Appointments  Date Time Provider [place]  06/25/2021  2:00 PM Zaunegger, Vianne Bulls, PT AP-REHP None  06/30/2021  2:00 PM RPC-CCM CARE [facility] R[facility]-RPC RPC  07/15/2021  2:00 PM RPC-CCM PHARMACIST RPC-RPC RPC  07/22/2021  2:00 PM Arnoldo Lenis, MD [facility]  08/20/2021  8:40 AM [name], MD RPC-RPC [facility]  12/17/2021  9:30 AM Oliver Barre, PA [facility]-AFIBC None   Curlene Labrum, MD [facility] [address] [place], [place] [address] [phone] (office)

## 2021-06-18 NOTE — Patient Instructions (Signed)
Activity as tolerated Glue will peel up Ok to shower. Ok to get IV in the arm if needed, no extensive dissection performed.

## 2021-06-19 DIAGNOSIS — E119 Type 2 diabetes mellitus without complications: Secondary | ICD-10-CM | POA: Diagnosis not present

## 2021-06-19 DIAGNOSIS — I10 Essential (primary) hypertension: Secondary | ICD-10-CM

## 2021-06-19 DIAGNOSIS — I5022 Chronic systolic (congestive) heart failure: Secondary | ICD-10-CM

## 2021-06-19 DIAGNOSIS — I4891 Unspecified atrial fibrillation: Secondary | ICD-10-CM | POA: Diagnosis not present

## 2021-06-22 DIAGNOSIS — C8214 Follicular lymphoma grade II, lymph nodes of axilla and upper limb: Secondary | ICD-10-CM | POA: Diagnosis not present

## 2021-06-24 DIAGNOSIS — K219 Gastro-esophageal reflux disease without esophagitis: Secondary | ICD-10-CM | POA: Diagnosis not present

## 2021-06-24 DIAGNOSIS — I10 Essential (primary) hypertension: Secondary | ICD-10-CM | POA: Diagnosis not present

## 2021-06-24 DIAGNOSIS — I5032 Chronic diastolic (congestive) heart failure: Secondary | ICD-10-CM | POA: Diagnosis not present

## 2021-06-24 DIAGNOSIS — T7840XA Allergy, unspecified, initial encounter: Secondary | ICD-10-CM | POA: Diagnosis not present

## 2021-06-24 DIAGNOSIS — I4891 Unspecified atrial fibrillation: Secondary | ICD-10-CM | POA: Diagnosis not present

## 2021-06-24 DIAGNOSIS — C7931 Secondary malignant neoplasm of brain: Secondary | ICD-10-CM | POA: Diagnosis not present

## 2021-06-24 DIAGNOSIS — T45515A Adverse effect of anticoagulants, initial encounter: Secondary | ICD-10-CM | POA: Diagnosis not present

## 2021-06-24 DIAGNOSIS — J45909 Unspecified asthma, uncomplicated: Secondary | ICD-10-CM | POA: Diagnosis not present

## 2021-06-24 DIAGNOSIS — N1831 Chronic kidney disease, stage 3a: Secondary | ICD-10-CM | POA: Diagnosis not present

## 2021-06-24 DIAGNOSIS — C7951 Secondary malignant neoplasm of bone: Secondary | ICD-10-CM | POA: Diagnosis not present

## 2021-06-24 DIAGNOSIS — I25119 Atherosclerotic heart disease of native coronary artery with unspecified angina pectoris: Secondary | ICD-10-CM | POA: Diagnosis not present

## 2021-06-24 DIAGNOSIS — I509 Heart failure, unspecified: Secondary | ICD-10-CM | POA: Insufficient documentation

## 2021-06-24 DIAGNOSIS — R002 Palpitations: Secondary | ICD-10-CM | POA: Diagnosis not present

## 2021-06-24 DIAGNOSIS — L299 Pruritus, unspecified: Secondary | ICD-10-CM | POA: Diagnosis not present

## 2021-06-24 DIAGNOSIS — C8284 Other types of follicular lymphoma, lymph nodes of axilla and upper limb: Secondary | ICD-10-CM | POA: Diagnosis not present

## 2021-06-24 DIAGNOSIS — I130 Hypertensive heart and chronic kidney disease with heart failure and stage 1 through stage 4 chronic kidney disease, or unspecified chronic kidney disease: Secondary | ICD-10-CM | POA: Diagnosis not present

## 2021-06-24 DIAGNOSIS — R9431 Abnormal electrocardiogram [ECG] [EKG]: Secondary | ICD-10-CM | POA: Diagnosis not present

## 2021-06-24 DIAGNOSIS — H269 Unspecified cataract: Secondary | ICD-10-CM | POA: Diagnosis not present

## 2021-06-24 DIAGNOSIS — L500 Allergic urticaria: Secondary | ICD-10-CM | POA: Diagnosis not present

## 2021-06-24 DIAGNOSIS — C8299 Follicular lymphoma, unspecified, extranodal and solid organ sites: Secondary | ICD-10-CM | POA: Diagnosis not present

## 2021-06-24 DIAGNOSIS — C8591 Non-Hodgkin lymphoma, unspecified, lymph nodes of head, face, and neck: Secondary | ICD-10-CM | POA: Diagnosis not present

## 2021-06-24 DIAGNOSIS — C8204 Follicular lymphoma grade I, lymph nodes of axilla and upper limb: Secondary | ICD-10-CM | POA: Diagnosis not present

## 2021-06-24 DIAGNOSIS — R001 Bradycardia, unspecified: Secondary | ICD-10-CM | POA: Diagnosis not present

## 2021-06-24 DIAGNOSIS — C8589 Other specified types of non-Hodgkin lymphoma, extranodal and solid organ sites: Secondary | ICD-10-CM | POA: Diagnosis not present

## 2021-06-24 DIAGNOSIS — D631 Anemia in chronic kidney disease: Secondary | ICD-10-CM | POA: Diagnosis not present

## 2021-06-24 DIAGNOSIS — T380X5A Adverse effect of glucocorticoids and synthetic analogues, initial encounter: Secondary | ICD-10-CM | POA: Diagnosis not present

## 2021-06-24 DIAGNOSIS — R739 Hyperglycemia, unspecified: Secondary | ICD-10-CM | POA: Diagnosis not present

## 2021-06-24 DIAGNOSIS — C8280 Other types of follicular lymphoma, unspecified site: Secondary | ICD-10-CM | POA: Diagnosis not present

## 2021-06-25 ENCOUNTER — Ambulatory Visit (HOSPITAL_COMMUNITY): Payer: Medicare Other | Admitting: Physical Therapy

## 2021-06-30 ENCOUNTER — Telehealth: Payer: Self-pay | Admitting: Cardiology

## 2021-06-30 ENCOUNTER — Telehealth: Payer: Medicare Other

## 2021-06-30 ENCOUNTER — Telehealth: Payer: Self-pay | Admitting: *Deleted

## 2021-06-30 ENCOUNTER — Telehealth: Payer: Self-pay

## 2021-06-30 NOTE — Telephone Encounter (Signed)
[name] -spouse called stating that patient was discharged from [facility] 06/29/2021. Patient's medications were changed and they need to go over these medications.

## 2021-06-30 NOTE — Telephone Encounter (Signed)
Pt's spouse wanted to let Dr. Harl Bowie know that the pt was admitted to [facility] for 6 days and was released on 06/29/21. Pt's medication was changed and pt's spouse wanted to let Dr. Harl Bowie know the hospitalist at [facility] stopped Metoprolol and pt was told to speak to Cardiologist before restarting. Eliquis was also stopped and replaced with Heparin injections 2x daily d/t hospitalist wanting pt to have a brain biopsy. Also, pt's spouse was concerned that while in the hospital, pt did not receive Hydralazine and pt's spouse would like to know if she is supposed to continue giving this medication to pt.   Please advise.

## 2021-06-30 NOTE — Telephone Encounter (Signed)
  Care Management   Follow Up Note   06/30/2021 Name: Darius Smith MRN: [id number] DOB: February 22, 1946   Referred by: [name], MD Reason for referral : Chronic Care Management (HTN, lymphoma/)   An unsuccessful telephone outreach was attempted today. The patient was referred to the case management team for assistance with care management and care coordination.   Follow Up Plan: Telephone follow up appointment with care management team member scheduled for:  upon care guide rescheduling.   Jacqlyn Larsen [facility], BSN RN Case Manager [facility] [phone]

## 2021-06-30 NOTE — Telephone Encounter (Signed)
Transition Care Management Follow-up Telephone Call Date of discharge and from where: 06/29/21 [facility] How have you been since you were released from the hospital? Relieved.  Any questions or concerns? No  Items Reviewed: Did the pt receive and understand the discharge instructions provided? Yes  Medications obtained and verified? Yes  Other? No  Any new allergies since your discharge? No  Dietary orders reviewed? Yes Do you have support at home? Yes  wife there for support   Home Care and Equipment/Supplies: Were home health services ordered? no If so, what is the name of the agency? N/a  Has the agency set up a time to come to the patient's home? not applicable Were any new equipment or medical supplies ordered?  No What is the name of the medical supply agency? /a Were you able to get the supplies/equipment? not applicable Do you have any questions related to the use of the equipment or supplies? No  Functional Questionnaire: (I = Independent and D = Dependent) ADLs: D- uses walker and needs assistance  Bathing/Dressing- D- needs assistance  Meal Prep- D  Eating- i  Maintaining continence- i  Transferring/Ambulation- d  Managing Meds- d- wife manages  Follow up appointments reviewed:  PCP Hospital f/u appt confirmed? Yes  Scheduled to see Moshe Cipro on Oct 13 @ 11:20. [place] [facility] f/u appt confirmed? No   Are transportation arrangements needed? No  If their condition worsens, is the pt aware to call PCP or go to the Emergency Dept.? Yes Was the patient provided with contact information for the PCP's office or ED? Yes Was to pt encouraged to call back with questions or concerns? Yes

## 2021-07-01 NOTE — Telephone Encounter (Signed)
A lot to go over, would be too much for a phone discussion. Looks like there is a 920 AM open appt on Monday if could schedule there  Zandra Abts MD

## 2021-07-01 NOTE — Telephone Encounter (Signed)
Pt has been scheduled with Dr. Harl Bowie 07/06/21 at 9:20 am

## 2021-07-02 ENCOUNTER — Ambulatory Visit (INDEPENDENT_AMBULATORY_CARE_PROVIDER_SITE_OTHER): Payer: Medicare Other | Admitting: Family Medicine

## 2021-07-02 ENCOUNTER — Other Ambulatory Visit: Payer: Self-pay

## 2021-07-02 ENCOUNTER — Encounter: Payer: Self-pay | Admitting: Family Medicine

## 2021-07-02 VITALS — BP 108/72 | HR 94 | Resp 18 | Ht 72.0 in | Wt 177.0 lb

## 2021-07-02 DIAGNOSIS — Z7689 Persons encountering health services in other specified circumstances: Secondary | ICD-10-CM | POA: Diagnosis not present

## 2021-07-02 NOTE — Patient Instructions (Signed)
F/U with Dr {Patel as before, call if you need to be seen sooner  All the very best with treatment and your illness, to you both AND TO YOUR FAMILY  TEST BLOOD SUGAR ONCE DAILY ONLY WHILE NOT ON PREDNISONE OR STEROIDS, BACK TO 3 TIMES DAILY IF BACK ON STEROIDS  GIVE INSULIN 3 UNITS FOR BLOOD SUGAR 180 OR  MORE AND FOLLOW THE SLIDING SCALE YOU ALREADY HAVE FOR BLOOD SUGAR LEVELS HIGHER THAN THIS   IF BLOOD SUGAR IS LESS THAN 180 DO NOT GIVE ANY INSULIN  HOLD OFF ON STRUCTURED PT NOW, AND CONSIDER IN HOME PT WHEN THE TIME COMES  Thanks for choosing [facility], we consider it a privelige to serve you.

## 2021-07-02 NOTE — Progress Notes (Signed)
   Darius Smith     MRN: [id number]      DOB: Feb 09, 1946   HPI 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 They are both waning to try treatment but are also adamant that quality of life is just as important Specific questions for the visit are how to dse insulin, she has been giving coverage for blood sugar of 103 based on the sSI coverage they got from local hospital d/c when he had recently been on high dose steroids. This is no longer the case and b blood suga=r is seldom 180, so I reviewed how to dose and insulin will be given only for blood sugar above 180 At some time there had been consideration for PT based on unsteady  gait with falls on an out pt basis, currently this is not indicated and if and when he needs it in home pT may be more appropriate He is motivated, has been very active up to recently and will do  home sitting exercises independently Wife is an excellent historian and medical records afe reviewed and questions answered at the visit  ROS See HPI  Denies recent fever or chills. Denies sinus pressure, nasal congestion, ear pain or sore throat. Denies chest congestion, productive cough or wheezing. Denies chest pains, palpitations and leg swelling Denies abdominal pain, nausea, vomiting,diarrhea or constipation.   Denies dysuria, frequency, hesitancy or incontinence.   PE  BP 108/72   Pulse 94   Resp 18   Ht 6' (1.829 m)   Wt 177 lb 0.6 oz (80.3 kg)   SpO2 92%   BMI 24.01 kg/m   Patient alert and oriented and in no cardiopulmonary distress.  HEENT: No facial asymmetry, EOMI,      Neck supple .  Chest: Clear to auscultation bilaterally.  CVS: S1, S2 no murmurs, no S3.Regular rate.  Ext: No edema [id number]  ROM spine, shoulders, hips and knees.  Skin: Intact, no ulcerations or rash noted.  Psych: Good eye contact, normal affect. Memory intact not anxious or depressed appearing.  CNS: CN 2-12 intact, no focal deficits noted  Assessment & Plan  Encounter for support and coordination of transition of care Patient in for follow up of recent hospitalization. laboratory and radiology data are reviewed, and any questions or concerns  are discussed. Specific issues requiring follow up are specifically addressed.

## 2021-07-02 NOTE — Assessment & Plan Note (Signed)
Patient in for follow up of recent hospitalization. laboratory and radiology data are reviewed, and any questions or concerns  are discussed. Specific issues requiring follow up are specifically addressed.

## 2021-07-06 ENCOUNTER — Ambulatory Visit: Payer: Medicare Other | Admitting: Cardiology

## 2021-07-06 NOTE — Progress Notes (Deleted)
Clinical Summary Darius Smith is a 75 y.o.maleseen today for follow up of the following medical problems.    1. Chronic systolic HF - 03/3531 echo LVEF 20-25%. New diagnosis of systolic HF at that time.  - recent admission 06/2020 with volume overload. BNP  2000s, CXR with edema.  - diuresis was limited by elevation in Cr - with elevation in Cr we stopped his entresto, started hydral/imdur.   - systolic dysfunction thought to be tachy mediated as significant issues with afib with RVR at the same time. - have not pursued ischemic testing as of yet, repeat echo few months after afib controlled and pending renal function     - 11/2020 echo LVEF 50%     - July 15 weight was 183 lbs. Most recent 184 lbs, overall home weights stable - no recent edema. No SOB/DOE. Takes lasix 19m daily, rarely needs extra. From pcp note was orthostatic at that visit, hydral was changed to 1079mbid from tid   2. Persistent afib - dynamaps do not accuratelty measure his HRs, best measured by EKG>  - issues with elevated HRs recently including during recent admission - difficulty controlling with beta blocker alone, avoiding CCB due to systolic dysfunction, renal function and HF limit antiarrhythmic options. During admission we started amiodarone - [place] attempt at [place], plan to continue oral load and retry in 3 weeks.    - on amio, DCCVs on 12/2 and 09/10/20, last 09/10/20 conversion was succesful. - has been maintaining SR at follow ups. - no recent symptoms  - during recent admission on d/c TOprol was 2534maily.    3. Stage IV lymphoma - from onc notes just monitoring at this time, in absence of symptoms no indciation for therapy.  - I did hear back from oncology, they report his lymphoma is not curable and treatment only helps symptoms. Prognosis can be a few years to a few decades, and they do not see a contraindciation to proceeding with cath in the future.    4. HTN -compliant with  meds    5. Dizziness  - abnormal orthostatics at pcp office - also recent diagnosis of sinusitis     - no symptoms lying or sitting. Only occurs with standing - feels off balance, denies dizzy or lightheaded - not much better with meclizine. Sinus congestion, fullnees in head - pcp started on azithromycin, completed course.    6. 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 Prior imaging had shown brain lesions.  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      Past Medical History:  Diagnosis Date   Allergic rhinitis 07/02/2019   Arthritis    both hands   Atrial fibrillation ([facility]   Dr. BraHarl BowieAtrial fibrillation with RVR ([place]CC[place]0/25/2021   Blood in urine    occ   Bronchitis    [place]cer ([place]  Basal cell   Cardiomegaly 10/28/2017   CHF (  congestive heart failure) ([place])    Complication of anesthesia    Mr. Knickerbocker developed a. fib in recovery after cysto procedure 10/2017 was transferred to [facility] and underwent successful cardioversion   Diverticulosis of sigmoid colon 08/2017   Noted on colonoscopy   Dysrhythmia    External hemorrhoids 08/2017   GERD (gastroesophageal reflux disease)    History of colon polyps 08/2017   History of kidney stones    History of rectal bleeding    History of right inguinal hernia    Hypertension    Lung cancer ([place])  06/25/2020   Lymphoma ([place])    Nephrolithiasis 07/02/2019   Rectal bleeding 09/02/2017   Added automatically from request for surgery [phone]   Shortness of breath    Stage 3 chronic kidney disease (HCC)      Allergies  Allergen Reactions   Augmentin [Amoxicillin-Pot Clavulanate] Other (See Comments)    Increased liver enzymes Has patient had a PCN reaction causing immediate rash, facial/tongue/throat swelling, SOB or lightheadedness with hypotension: No Has patient had a PCN reaction causing severe rash involving mucus membranes or skin necrosis: No Has patient had a PCN reaction that required hospitalization: No Has patient had a PCN reaction occurring within the last 10 years: No  If all of the above answers are "NO", then may proceed with Cephalosporin use.    Flomax [Tamsulosin Hcl] Nausea Only    Dizziness  Pt is on flomax     Current Outpatient Medications  Medication Sig Dispense Refill   acetaminophen (TYLENOL) 500 MG tablet Take 1,000 mg by mouth every 6 (six) hours as needed for moderate pain or headache.     amiodarone (PACERONE) 200 MG tablet Take 1 tablet (200 mg total) by mouth daily. 60 tablet 6   amLODipine (NORVASC) 10 MG tablet Take 1 tablet (10 mg total) by mouth daily. 90 tablet 3   apixaban (ELIQUIS) 5 MG TABS tablet TAKE ONE TABLET BY MOUTH 2 TIMES A DAY. 60 tablet 5   blood glucose meter kit and supplies KIT Inject 1 each into the skin 4 (four) times daily -  before meals and at bedtime. Dispense based on patient and insurance preference. Use up to four times daily as directed. 1 each 0   cetirizine (ZYRTEC) 10 MG tablet Take 1 tablet (10 mg total) by mouth daily as needed for allergies. 90 tablet 2   Continuous Blood Gluc Receiver (FREESTYLE LIBRE 2 READER) DEVI Inject 1 each into the skin in the morning, at noon, in the evening, and at bedtime.     Continuous Blood Gluc Sensor (FREESTYLE LIBRE 2 SENSOR) MISC Inject 1 each into the skin in the morning, at  noon, in the evening, and at bedtime.     dexamethasone (DECADRON) 2 MG tablet Take 1 tablet (2 mg total) by mouth 2 (two) times daily. (Patient not taking: Reported on 07/02/2021) 60 tablet 0   furosemide (LASIX) 20 MG tablet Take 1 tablet (20 mg total) by mouth every other day. (MAY TAKE DAILY AS NEEDED FOR INCREASED SWELLING)     hydrALAZINE (APRESOLINE) 100 MG tablet Take 1 tablet (100 mg total) by mouth 2 (two) times daily. 180 tablet 3   insulin lispro (HUMALOG KWIKPEN) 100 UNIT/ML KwikPen Inject 1-10 Units into the skin 4 (four) times daily -  before meals and at bedtime. Blood sugars 70-120: 1 unit 121-150: 2 units 151-200: 3 units 201-250: 4 units 251-300: 5 units 301-350: 6 units 351-400: 9 units More than  400, 10 units and call your doctor 15 mL 11   Insulin Pen Needle 29G X 12MM MISC 1 each by Does not apply route 4 (four) times daily -  before meals and at bedtime. 100 each 0   isosorbide mononitrate (IMDUR) 30 MG 24 hr tablet TAKE 1/2 TABLET BY MOUTH ONCE DAILY. 90 tablet 3   Lancets (ONETOUCH DELICA PLUS NIOEVO35K) MISC 1 each by Other route 4 (four) times daily.     metoprolol succinate (TOPROL XL) 25 MG 24 hr tablet Take 1 tablet (25 mg total) by mouth daily. (Patient not taking: Reported on 07/02/2021) 90 tablet 3   omeprazole (PRILOSEC) 20 MG capsule Take 1 capsule (20 mg total) by mouth daily as needed (reflux). 90 capsule 1   ONETOUCH ULTRA test strip 1 each by Other route 4 (four) times daily.     VENTOLIN HFA 108 (90 Base) MCG/ACT inhaler Inhale 2 puffs into the lungs every 6 (six) hours as needed for wheezing or shortness of breath. 18 g 3   No current facility-administered medications for this visit.     Past Surgical History:  Procedure Laterality Date   AXILLARY LYMPH NODE BIOPSY Left 06/05/2021   Procedure: AXILLARY LYMPH NODE BIOPSY;  Surgeon: Virl Cagey, MD;  Location: [facility];  Service: General;  Laterality: Left;   CARDIOVERSION     10/2017   CARDIOVERSION  N/A 07/16/2020   Procedure: CARDIOVERSION;  Surgeon: Arnoldo Lenis, MD;  Location: [facility];  Service: Endoscopy;  Laterality: N/A;   CARDIOVERSION N/A 08/21/2020   Procedure: CARDIOVERSION;  Surgeon: Satira Sark, MD;  Location: [facility];  Service: Cardiovascular;  Laterality: N/A;   CARDIOVERSION N/A 09/10/2020   Procedure: CARDIOVERSION;  Surgeon: Josue Hector, MD;  Location[facility] ENDOSCOPY;  Service: Cardiovascular;  Laterality: N/A;   CHOLECYSTECTOMY     CLEFT LIP REPAIR     several from childhood til 75 years old   [place]     several from childhood til 75 years old   COLONOSCOPY  04/28/2011   Procedure: COLONOSCOPY;  Surgeon: Rogene Houston, MD;  Location: [facility];  Service: Endoscopy;  Laterality: N/A;   COLONOSCOPY N/A 07/18/2014   Procedure: COLONOSCOPY;  Surgeon: Rogene Houston, MD;  Location: [facility];  Service: Endoscopy;  Laterality: N/A;  830   COLONOSCOPY N/A 09/09/2017   Procedure: COLONOSCOPY;  Surgeon: Rogene Houston, MD;  Location: [facility];  Service: Endoscopy;  Laterality: N/A;  955   CYSTOSCOPY/URETEROSCOPY/HOLMIUM LASER/STENT PLACEMENT Bilateral 12/06/2017   Procedure: CYSTOSCOPY/RETROGRADE/URETEROSCOPY/HOLMIUM LASER/STENT EXCHANGE;  Surgeon: Festus Aloe, MD;  Location: [facility];  Service: Urology;  Laterality: Bilateral;  ONLY NEEDS 60 MIN   HERNIA REPAIR     POLYPECTOMY  09/09/2017   Procedure: POLYPECTOMY;  Surgeon: Rogene Houston, MD;  Location: [facility];  Service: Endoscopy;;  colon   vocal cord surgery       Allergies  Allergen Reactions   Augmentin [Amoxicillin-Pot Clavulanate] Other (See Comments)    Increased liver enzymes Has patient had a PCN reaction causing immediate rash, facial/tongue/throat swelling, SOB or lightheadedness with hypotension: No Has patient had a PCN reaction causing severe rash involving mucus membranes or skin necrosis: No Has patient had a PCN reaction that required  hospitalization: No Has patient had a PCN reaction occurring within the last 10 years: No  If all of the above answers are "NO", then may proceed with Cephalosporin use.  Flomax [Tamsulosin Hcl] Nausea Only    Dizziness  Pt is on flomax      Family History  Problem Relation Age of Onset   Breast cancer Mother    Prostate cancer Father    COPD Sister      Social History Mr. Coble reports that he has never smoked. He has never used smokeless tobacco. Mr. Draheim reports that he does not currently use alcohol after a past usage of about 7.0 standard drinks per week.   Review of Systems CONSTITUTIONAL: No weight loss, fever, chills, weakness or fatigue.  HEENT: Eyes: No visual loss, blurred vision, double vision or yellow sclerae.No hearing loss, sneezing, congestion, runny nose or sore throat.  SKIN: No rash or itching.  CARDIOVASCULAR:  RESPIRATORY: No shortness of breath, cough or sputum.  GASTROINTESTINAL: No anorexia, nausea, vomiting or diarrhea. No abdominal pain or blood.  GENITOURINARY: No burning on urination, no polyuria NEUROLOGICAL: No headache, dizziness, syncope, paralysis, ataxia, numbness or tingling in the extremities. No change in bowel or bladder control.  MUSCULOSKELETAL: No muscle, back pain, joint pain or stiffness.  LYMPHATICS: No enlarged nodes. No history of splenectomy.  PSYCHIATRIC: No history of depression or anxiety.  ENDOCRINOLOGIC: No reports of sweating, cold or heat intolerance. No polyuria or polydipsia.  Marland Kitchen   Physical Examination There were no vitals filed for this visit. There were no vitals filed for this visit.  Gen: resting comfortably, no acute distress HEENT: no scleral icterus, pupils equal round and reactive, no palptable cervical adenopathy,  CV Resp: Clear to auscultation bilaterally GI: abdomen is soft, non-tender, non-distended, normal bowel sounds, no hepatosplenomegaly MSK: extremities are warm, no edema.  Skin: warm, no  rash Neuro:  no focal deficits Psych: appropriate affect   Diagnostic Studies  11/2020 echo IMPRESSIONS     1. Left ventricular ejection fraction, by estimation, is 50%. The left  ventricle has mildly decreased function. The left ventricle has no  regional wall motion abnormalities. The left ventricular internal cavity  size was mildly dilated. Left ventricular  diastolic parameters are consistent with Grade I diastolic dysfunction  (impaired relaxation). The average left ventricular global longitudinal  strain is -18.2 %. The global longitudinal strain is normal.   2. Right ventricular systolic function is normal. The right ventricular  size is normal.   3. Left atrial size was severely dilated.   4. Right atrial size was mildly dilated.   5. The mitral valve is normal in structure. No evidence of mitral valve  regurgitation. No evidence of mitral stenosis.   6. The aortic valve was not well visualized. There is mild calcification  of the aortic valve. There is mild thickening of the aortic valve. Aortic  valve regurgitation is mild. Mild aortic valve stenosis.   7. The inferior vena cava is normal in size with greater than 50%  respiratory variability, suggesting right atrial pressure of 3 mmHg.   Comparison(s): Echocardiogram done 06/12/20 showed an EF of 20-25%.   Assessment and Plan  1. Chronic systolic HF - possibly tachy mediated given recent issues with afib with RVR - entresto stopped during recent admission with AKI.  - from last echo LVEF has normalized.  - from pcp notes was orthostatic in clinic, we will change lasix to 59m every other day, may take daily as needed for weight gain or edema.      2. Afib - no symptoms, continue currentmeds - some bradycardia in 50s. Very difficult time getting his afib controlled previously,  would not alter regimen unless more significant brancyardia.   3. HTN - at goal, ok to continue hydral 121m bid that was changed by  pcp   4. Dizziness - sounds more like related to sinusitis, he was however orthotatitc at pcp office - lasix and hydral changes as reported above, ongoing management of sinusitis by pcp        JArnoldo Lenis M.D., F.A.C.C.

## 2021-07-08 DIAGNOSIS — Z5111 Encounter for antineoplastic chemotherapy: Secondary | ICD-10-CM | POA: Diagnosis not present

## 2021-07-08 DIAGNOSIS — C8589 Other specified types of non-Hodgkin lymphoma, extranodal and solid organ sites: Secondary | ICD-10-CM | POA: Diagnosis not present

## 2021-07-08 DIAGNOSIS — Z1159 Encounter for screening for other viral diseases: Secondary | ICD-10-CM | POA: Diagnosis not present

## 2021-07-08 DIAGNOSIS — C8590 Non-Hodgkin lymphoma, unspecified, unspecified site: Secondary | ICD-10-CM | POA: Diagnosis not present

## 2021-07-09 ENCOUNTER — Encounter: Payer: Self-pay | Admitting: Cardiology

## 2021-07-09 ENCOUNTER — Encounter: Payer: Self-pay | Admitting: Internal Medicine

## 2021-07-09 ENCOUNTER — Other Ambulatory Visit: Payer: Self-pay

## 2021-07-09 ENCOUNTER — Encounter: Payer: Self-pay | Admitting: Nurse Practitioner

## 2021-07-09 ENCOUNTER — Ambulatory Visit: Payer: Medicare Other | Admitting: Cardiology

## 2021-07-09 ENCOUNTER — Other Ambulatory Visit: Payer: Medicare Other | Admitting: Nurse Practitioner

## 2021-07-09 VITALS — BP 116/58 | HR 86 | Ht 72.0 in | Wt 181.6 lb

## 2021-07-09 DIAGNOSIS — I10 Essential (primary) hypertension: Secondary | ICD-10-CM

## 2021-07-09 DIAGNOSIS — I5022 Chronic systolic (congestive) heart failure: Secondary | ICD-10-CM | POA: Diagnosis not present

## 2021-07-09 DIAGNOSIS — R630 Anorexia: Secondary | ICD-10-CM

## 2021-07-09 DIAGNOSIS — Z515 Encounter for palliative care: Secondary | ICD-10-CM

## 2021-07-09 DIAGNOSIS — I4891 Unspecified atrial fibrillation: Secondary | ICD-10-CM

## 2021-07-09 DIAGNOSIS — C7931 Secondary malignant neoplasm of brain: Secondary | ICD-10-CM

## 2021-07-09 DIAGNOSIS — R0602 Shortness of breath: Secondary | ICD-10-CM | POA: Diagnosis not present

## 2021-07-09 MED ORDER — ONETOUCH ULTRA VI STRP: 1.0000 | ORAL_STRIP | Freq: Four times a day (QID) | 0 refills | Status: DC

## 2021-07-09 MED ORDER — INSULIN PEN NEEDLE 29G X 12MM MISC: 1.0000 | Freq: Three times a day (TID) | 0 refills | Status: DC

## 2021-07-09 MED ORDER — METOPROLOL SUCCINATE ER 25 MG PO TB24: 12.5000 mg | ORAL_TABLET | Freq: Every day | ORAL | 3 refills | Status: DC

## 2021-07-09 NOTE — Patient Instructions (Signed)
Medication Instructions:  Begin Toprol XL 12.5mg  daily.  Continue all other medications.     Labwork: none  Testing/Procedures: none  Follow-Up: 4 months   Any Other Special Instructions Will Be Listed Below (If Applicable).   If you need a refill on your cardiac medications before your next appointment, please call your pharmacy.

## 2021-07-09 NOTE — Telephone Encounter (Signed)
Refills sent

## 2021-07-09 NOTE — Progress Notes (Signed)
Clinical Summary [name] is a 75 y.o.male seen today for follow up of the following medical problems.    1. Chronic systolic HF - 05/2425 echo LVEF 20-25%. New diagnosis of systolic HF at that time.  - recent admission 06/2020 with volume overload. BNP  2000s, CXR with edema.  - diuresis was limited by elevation in Cr - with elevation in Cr we stopped his entresto, started hydral/imdur.   - systolic dysfunction thought to be tachy mediated as significant issues with afib with RVR at the same time. - have not pursued ischemic testing as of yet, repeat echo few months after afib controlled and pending renal function     - 11/2020 echo LVEF 50%     - July 15 weight was 183 lbs. Most recent 184 lbs, overall home weights stable - no recent edema. No SOB/DOE. Takes lasix 73m daily, rarely needs extra. From pcp note was orthostatic at that visit, hydral was changed to 1096mbid from tid  06/2021 echo LVEF 50-55% - no recent edema, no SOB/DOE - home weights 171-172 lbs - [facility] 25 mg [facility]     2. Persistent afib - dynamaps do not accuratelty measure his HRs, best measured by EKG>  - issues with elevated HRs recently including during recent admission - difficulty controlling with beta blocker alone, avoiding CCB due to systolic dysfunction, renal function and HF limit antiarrhythmic options. During admission we started amiodarone - failed attempt at [facility] [place]/21, plan to continue oral load and retry in 3 weeks.    - on amio, DCCVs on 12/2 and 09/10/20, last 09/10/20 conversion was succesful. - has been maintaining SR at follow ups. - no recent symptoms   - during recent admission on d/c TOprol was 2548maily. Eliquis changed to lovenox in anticipation of brain bx -he is back on eliquis    3. Stage IV lymphoma - from onc notes just monitoring at this time, in absence of symptoms no indciation for therapy.  - I did hear  back from oncology, they report his lymphoma is not curable and treatment only helps symptoms. Prognosis can be a few years to a few decades, and they do not see a contraindciation to proceeding with cath in the future.   - [facility]   4. HTN - he is compliant with meds    5. Leg weakness [facility], mechanical falls at home - imaging showed metastatic lesions throughout of the spine. Prior imaging had shown brain lesions.  - Brain MRI on 06/26/21 demonstrated new subependymoma nodular enhancement along bilateral lateral ventricles without mass-effect or hydrocephalus; decreased size of multiple supratentorial and infratentorial enhancing lesions compared to prior outside MRI Neurosurgery was initially consulted and recommended waiting for CSF cytology to result prior to scheduling craniectomy for intracranial lesion biopsy     Wife is former dirTherapist, occupationald at [facility] good at keeping track of his weights, vitals etc.    Past Medical History:  Diagnosis Date   Allergic rhinitis 07/02/2019   Arthritis    both hands   Atrial fibrillation ([place]CC[place]2/04/2018   Dr. BraHarl BowieAtrial fibrillation with RVR ([place]CC[place]0/25/2021   Blood in urine    occ   Bronchitis    [place]cer [place]  Basal cell   Cardiomegaly 10/28/2017  CHF (congestive heart failure) ([place])    Complication of anesthesia    [name] developed a. fib in recovery after cysto procedure 10/2017 was transferred to [facility] and underwent successful cardioversion   Diverticulosis of sigmoid colon 08/2017   Noted on colonoscopy   Dysrhythmia    External hemorrhoids 08/2017   GERD (gastroesophageal reflux disease)    History of colon polyps 08/2017   History of kidney stones    History of rectal bleeding    History of right inguinal hernia    Hypertension    Lung cancer ([place]) 06/25/2020   Lymphoma ([place])     Nephrolithiasis 07/02/2019   Rectal bleeding 09/02/2017   Added automatically from request for surgery [phone]   Shortness of breath    Stage 3 chronic kidney disease (HCC)      Allergies  Allergen Reactions   Augmentin [Amoxicillin-Pot Clavulanate] Other (See Comments)    Increased liver enzymes Has patient had a PCN reaction causing immediate rash, facial/tongue/throat swelling, SOB or lightheadedness with hypotension: No Has patient had a PCN reaction causing severe rash involving mucus membranes or skin necrosis: No Has patient had a PCN reaction that required hospitalization: No Has patient had a PCN reaction occurring within the last 10 years: No  If all of the above answers are "NO", then may proceed with Cephalosporin use.    Flomax [Tamsulosin Hcl] Nausea Only    Dizziness  Pt is on flomax     Current Outpatient Medications  Medication Sig Dispense Refill   acetaminophen (TYLENOL) 500 MG tablet Take 1,000 mg by mouth every 6 (six) hours as needed for moderate pain or headache.     amiodarone (PACERONE) 200 MG tablet Take 1 tablet (200 mg total) by mouth daily. 60 tablet 6   amLODipine (NORVASC) 10 MG tablet Take 1 tablet (10 mg total) by mouth daily. 90 tablet 3   apixaban (ELIQUIS) 5 MG TABS tablet TAKE ONE TABLET BY MOUTH 2 TIMES A DAY. 60 tablet 5   blood glucose meter kit and supplies KIT Inject 1 each into the skin 4 (four) times daily -  before meals and at bedtime. Dispense based on patient and insurance preference. Use up to four times daily as directed. 1 each 0   cetirizine (ZYRTEC) 10 MG tablet Take 1 tablet (10 mg total) by mouth daily as needed for allergies. 90 tablet 2   Continuous Blood Gluc Receiver (FREESTYLE LIBRE 2 READER) DEVI Inject 1 each into the skin in the morning, at noon, in the evening, and at bedtime.     Continuous Blood Gluc Sensor (FREESTYLE LIBRE 2 SENSOR) MISC Inject 1 each into the skin in the morning, at noon, in the evening, and at  bedtime.     dexamethasone (DECADRON) 2 MG tablet Take 1 tablet (2 mg total) by mouth 2 (two) times daily. (Patient not taking: Reported on 07/02/2021) 60 tablet 0   furosemide (LASIX) 20 MG tablet Take 1 tablet (20 mg total) by mouth every other day. (MAY TAKE DAILY AS NEEDED FOR INCREASED SWELLING)     hydrALAZINE (APRESOLINE) 100 MG tablet Take 1 tablet (100 mg total) by mouth 2 (two) times daily. 180 tablet 3   insulin lispro (HUMALOG KWIKPEN) 100 UNIT/ML KwikPen Inject 1-10 Units into the skin 4 (four) times daily -  before meals and at bedtime. Blood sugars 70-120: 1 unit 121-150: 2 units 151-200: 3 units 201-250: 4 units 251-300: 5 units 301-350: 6 units 351-400: 9 units More  than 400, 10 units and call your doctor 15 mL 11   Insulin Pen Needle 29G X 12MM MISC 1 each by Does not apply route 4 (four) times daily -  before meals and at bedtime. 100 each 0   isosorbide mononitrate (IMDUR) 30 MG 24 hr tablet TAKE 1/2 TABLET BY MOUTH ONCE DAILY. 90 tablet 3   Lancets (ONETOUCH DELICA PLUS LANCET33G) MISC 1 each by Other route 4 (four) times daily.     metoprolol succinate (TOPROL XL) 25 MG 24 hr tablet Take 1 tablet (25 mg total) by mouth daily. (Patient not taking: Reported on 07/02/2021) 90 tablet 3   omeprazole (PRILOSEC) 20 MG capsule Take 1 capsule (20 mg total) by mouth daily as needed (reflux). 90 capsule 1   ONETOUCH ULTRA test strip 1 each by Other route 4 (four) times daily.     VENTOLIN HFA 108 (90 Base) MCG/ACT inhaler Inhale 2 puffs into the lungs every 6 (six) hours as needed for wheezing or shortness of breath. 18 g 3   No current facility-administered medications for this visit.     Past Surgical History:  Procedure Laterality Date   AXILLARY LYMPH NODE BIOPSY Left 06/05/2021   Procedure: AXILLARY LYMPH NODE BIOPSY;  Surgeon: Bridges, Lindsay C, MD;  Location: [facility];  Service: General;  Laterality: Left;   CARDIOVERSION     10/2017   CARDIOVERSION N/A 07/16/2020   Procedure:  CARDIOVERSION;  Surgeon: Branch, Jonathan F, MD;  Location: [facility] END[facility] SU[facility];  Service: Endoscopy;  Laterality: N/A;   CARDIOVERSION N/A 08/21/2020   Procedure: CARDIOVERSION;  Surgeon: McDowell, Samuel G, MD;  Location: [facility];  Service: Cardiovascular;  Laterality: N/A;   CARDIOVERSION N/A 09/10/2020   Procedure: CARDIOVERSION;  Surgeon: Nishan, Peter C, MD;  Location: [facility];  Service: Cardiovascular;  Laterality: N/A;   CHOLECYSTECTOMY     CLEFT LIP REPAIR     several from childhood til 75 years old   CLEFT PALATE REPAIR     several from childhood til 75 years old   COLONOSCOPY  04/28/2011   Procedure: COLONOSCOPY;  Surgeon: Najeeb U Rehman, MD;  Location: [facility];  Service: Endoscopy;  Laterality: N/A;   COLONOSCOPY N/A 07/18/2014   Procedure: COLONOSCOPY;  Surgeon: Najeeb U Rehman, MD;  Location: [facility];  Service: Endoscopy;  Laterality: N/A;  830   COLONOSCOPY N/A 09/09/2017   Procedure: COLONOSCOPY;  Surgeon: Rehman, Najeeb U, MD;  Location: [facility];  Service: Endoscopy;  Laterality: N/A;  955   CYSTOSCOPY/URETEROSCOPY/HOLMIUM LASER/STENT PLACEMENT Bilateral 12/06/2017   Procedure: CYSTOSCOPY/RETROGRADE/URETEROSCOPY/HOLMIUM LASER/STENT EXCHANGE;  Surgeon: Eskridge, Matthew, MD;  Location: [facility];  Service: Urology;  Laterality: Bilateral;  ONLY NEEDS 60 MIN   HERNIA REPAIR     POLYPECTOMY  09/09/2017   Procedure: POLYPECTOMY;  Surgeon: Rehman, Najeeb U, MD;  Location: [facility];  Service: Endoscopy;;  colon   vocal cord surgery       Allergies  Allergen Reactions   Augmentin [Amoxicillin-Pot Clavulanate] Other (See Comments)    Increased liver enzymes Has patient had a PCN reaction causing immediate rash, facial/tongue/throat swelling, SOB or lightheadedness with hypotension: No Has patient had a PCN reaction causing severe rash involving mucus membranes or skin necrosis: No Has patient had a PCN reaction that required hospitalization: No Has patient had  a PCN reaction occurring within the last 10 years: No  If all of the above answers are "NO", then may proceed with Cephalosporin use.      Flomax [Tamsulosin Hcl] Nausea Only    Dizziness  Pt is on flomax      Family History  Problem Relation Age of Onset   Breast cancer Mother    Prostate cancer Father    COPD Sister      Social History Mr. Polhamus reports that he has never smoked. He has never used smokeless tobacco. Mr. Bachus reports that he does not currently use alcohol after a past usage of about 7.0 standard drinks per week.   Review of Systems CONSTITUTIONAL: No weight loss, fever, chills, weakness or fatigue.  HEENT: Eyes: No visual loss, blurred vision, double vision or yellow sclerae.No hearing loss, sneezing, congestion, runny nose or sore throat.  SKIN: No rash or itching.  CARDIOVASCULAR: per hpi RESPIRATORY: No shortness of breath, cough or sputum.  GASTROINTESTINAL: No anorexia, nausea, vomiting or diarrhea. No abdominal pain or blood.  GENITOURINARY: No burning on urination, no polyuria NEUROLOGICAL: No headache, dizziness, syncope, paralysis, ataxia, numbness or tingling in the extremities. No change in bowel or bladder control.  MUSCULOSKELETAL: No muscle, back pain, joint pain or stiffness.  LYMPHATICS: No enlarged nodes. No history of splenectomy.  PSYCHIATRIC: No history of depression or anxiety.  ENDOCRINOLOGIC: No reports of sweating, cold or heat intolerance. No polyuria or polydipsia.  .   Physical Examination Today's Vitals   07/09/21 0806  BP: (!) 116/58  Pulse: 86  SpO2: 98%  Weight: 181 lb 9.6 oz (82.4 kg)  Height: 6' (1.829 m)   Body mass index is 24.63 kg/m.  Gen: resting comfortably, no acute distress HEENT: no scleral icterus, pupils equal round and reactive, no palptable cervical adenopathy,  CV: RRR, no m/r/g no jvd Resp: Clear to auscultation bilaterally GI: abdomen is soft, non-tender, non-distended, normal bowel sounds, no  hepatosplenomegaly MSK: extremities are warm, no edema.  Skin: warm, no rash Neuro:  no focal deficits Psych: appropriate affect   Diagnostic Studies 11/2020 echo IMPRESSIONS     1. Left ventricular ejection fraction, by estimation, is 50%. The left  ventricle has mildly decreased function. The left ventricle has no  regional wall motion abnormalities. The left ventricular internal cavity  size was mildly dilated. Left ventricular  diastolic parameters are consistent with Grade I diastolic dysfunction  (impaired relaxation). The average left ventricular global longitudinal  strain is -18.2 %. The global longitudinal strain is normal.   2. Right ventricular systolic function is normal. The right ventricular  size is normal.   3. Left atrial size was severely dilated.   4. Right atrial size was mildly dilated.   5. The mitral valve is normal in structure. No evidence of mitral valve  regurgitation. No evidence of mitral stenosis.   6. The aortic valve was not well visualized. There is mild calcification  of the aortic valve. There is mild thickening of the aortic valve. Aortic  valve regurgitation is mild. Mild aortic valve stenosis.   7. The inferior vena cava is normal in size with greater than 50%  respiratory variability, suggesting right atrial pressure of 3 mmHg.   Comparison(s): Echocardiogram done 06/12/20 showed an EF of 20-25%.   06/2021 echo [facility] SUMMARY  The left ventricular size is normal.  Mild left ventricular hypertrophy  Left ventricular systolic function is low normal.  LV ejection fraction = 50-55%.  The right ventricle is normal in size and function.  There is aortic valve sclerosis.  There is mild aortic regurgitation.  No significant stenosis seen  Estimated right ventricular systolic   pressure is 27 mmHg.  The inferior vena cava was not visualized during the exam.  There is no pericardial effusion.  T  There is no comparison study available.     Assessment and Plan  1. Chronic systolic HF - possibly tachy mediated given recent issues with afib with RVR - entresto stopped during recent admission with AKI.  - from last echo LVEF has normalized.  - no recent symptoms - some recent low HRs on toprol 25, will see if he can tolerate 12.8m daily      2. Afib - no recent symptoms - continue current meds   3. HTN - he is at goal, continue current meds         JArnoldo Lenis M.D.

## 2021-07-09 NOTE — Progress Notes (Signed)
[facility] Consult Note Telephone: [phone]  Fax: [phone]    Date of encounter: 07/09/21 10:21 PM PATIENT NAME: Darius Smith [place] [address]   [phone] (home)  DOB: Jun 11, 1946 MRN: [id number] PRIMARY CARE PROVIDER:    [name], MD,  [address] [place] [address] [phone]  RESPONSIBLE PARTY:    Contact Information     Name Relation Home Work Mobile   Truett, Mcfarlan [phone]  [phone]      Due to the COVID-19 crisis, this visit was done via telemedicine from my office and it was initiated and consent by this patient and or family.  I connected with Darius Smith for  [place] [facility] on 07/09/21 by a phone as video not available enabled telemedicine application and verified that I am speaking with the correct person using two identifiers.   I discussed the limitations of evaluation and management by telemedicine. The patient expressed understanding and agreed to proceed. Palliative Care was asked to follow this patient by consultation request of  [name], MD to address advance care planning and complex medical decision making. This is a follow up visit.                                  ASSESSMENT AND PLAN / RECOMMENDATIONS: Symptom Management/Plan: 1. Advance Care Planning; DNR in vynca  2. Shortness of breath secondary to CHF, stable, continue to monitor weights; edema; continue with cardiology; stable echo repeated from 20 to 25% to 50%.   3. 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 Monitor for side effects, manage symptoms currently asymptomatic; discussed nutrition   4. Palliative care encounter; Palliative care encounter; Palliative medicine team will continue to support patient, patient's family, and medical team. Visit consisted of counseling and education dealing with  the complex and emotionally intense issues of symptom management and palliative care in the setting of serious and potentially life-threatening illness  5. F/u pc visit in 6 weeks or sooner if declines for NP; PC RN to f/u 2 weeks  Follow up Palliative Care Visit: Palliative care will continue to follow for complex medical decision making, advance care planning, and clarification of goals. Return 8 weeks or prn.  I spent 46 minutes providing this consultation. More than 50% of the time in this consultation was spent in counseling and care coordination.  PPS: 50%  Chief Complaint: Follow up palliative consult for complex medical decision making  HISTORY OF PRESENT ILLNESS:  DAQWAN DOUGAL is a 75 y.o. year old male  with  multiple medical problems including Cardiomegaly with ef 20 to 25%, atrial fibrillation, lymphoma, hypertension, chronic systolic congestive heart failure, gerd, chronic kidney disease, history of kidney stone, arthritis, cholecystectomy, history of cardioversion. I called Mrs and Mr Smith for telemedicine telephonic as video not available for follow up PC visit. Mr and Darius Smith both in agreement. Recent hospitalization from 06/03/2021 to 06/05/2021 for  left axillary lymph node biopsy following recent admission for hyperkalemia, hyperglycemia, a on CKD polyuria, polydipsia. 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 Darius Smith is currently receiving Chronic care management. I called Darius Smith for telemedicine telephonic as video not available for [facility] visit.  We talked about purpose of visit. Darius Smith in agreement. We talked about how Darius Smith has been feeling. We talked about recent hospitalizations, lymphoma with metastasis and starting chemotherapy without side effects. Darius Smith endorses they are waiting to receive oral chemotherapy. We talked about realistic  expectations. Currently Mr and Mrs Smith wishes to continue treatment until quality of life is impaired. Goal is quality of life. We talked about further plan, MD appointments coming up. Reviewed medical decisions. We talked about Darius Smith functional abilities with some increase in weakness where he has to use a w/c with appointments. We talked about resting periods. We talked about ambulating with walker. We talked about no recent falls. We talked about adl's. We talked about importance of comfort. We talked about role pc in poc. DIscused will have PC RN contact for follow up visit following next Oncology visit, then [facility] NP visit in 6 weeks to see how Darius Smith does. Darius Smith in agreement. Questions answered. Therapeutic listening, emotional support provided.   History obtained from review of EMR, discussion with primary team, and interview with family, facility staff/caregiver and/or Darius Smith.  I reviewed available labs, medications, imaging, studies and related documents from the EMR.  Records reviewed and summarized above.   ROS Full 10 system review of systems performed and negative with exception of: as per HPI.   Physical Exam: deferred Questions and concerns were addressed. The patient/family was encouraged to call with questions and/or concerns. My contact information was provided. Provided general support and encouragement, no other unmet needs identified   Thank you for the opportunity to participate in the care of Darius Smith.  The palliative care team will continue to follow. Please call our office at [phone] if we can be of additional assistance.   This chart was dictated using voice recognition software.  Despite best efforts to proofread,  errors can occur which can change the documentation meaning.   Kahla Risdon [name], NP

## 2021-07-15 ENCOUNTER — Ambulatory Visit: Payer: Medicare Other

## 2021-07-15 DIAGNOSIS — C8589 Other specified types of non-Hodgkin lymphoma, extranodal and solid organ sites: Secondary | ICD-10-CM | POA: Diagnosis not present

## 2021-07-15 DIAGNOSIS — Z5111 Encounter for antineoplastic chemotherapy: Secondary | ICD-10-CM | POA: Diagnosis not present

## 2021-07-16 ENCOUNTER — Other Ambulatory Visit: Payer: Self-pay

## 2021-07-16 ENCOUNTER — Ambulatory Visit (INDEPENDENT_AMBULATORY_CARE_PROVIDER_SITE_OTHER): Payer: Medicare Other | Admitting: Pharmacist

## 2021-07-16 DIAGNOSIS — I5022 Chronic systolic (congestive) heart failure: Secondary | ICD-10-CM

## 2021-07-16 DIAGNOSIS — I10 Essential (primary) hypertension: Secondary | ICD-10-CM

## 2021-07-16 DIAGNOSIS — C8258 Diffuse follicle center lymphoma, lymph nodes of multiple sites: Secondary | ICD-10-CM

## 2021-07-16 DIAGNOSIS — C7931 Secondary malignant neoplasm of brain: Secondary | ICD-10-CM

## 2021-07-16 DIAGNOSIS — I4891 Unspecified atrial fibrillation: Secondary | ICD-10-CM

## 2021-07-16 DIAGNOSIS — E119 Type 2 diabetes mellitus without complications: Secondary | ICD-10-CM

## 2021-07-16 DIAGNOSIS — N1832 Chronic kidney disease, stage 3b: Secondary | ICD-10-CM

## 2021-07-16 NOTE — Patient Instructions (Signed)
Darius Smith,  It was great to talk to you today!  Please call me with any questions or concerns.   Visit Information  PATIENT GOALS:  Goals Addressed             This Visit's Progress    Medication Management       Patient Goals/Self-Care Activities Over the next 90 days, patient will:  Take medications as prescribed Check blood sugar continuously with continuous glucose monitor, document, and provide at future appointments Check blood pressure at least once daily, document, and provide at future appointments Weigh daily, and contact provider if weight gain of more than 3 lbs in 1 day or more than 5 lbs in 1 week         Print copy of patient instructions, educational materials, and care plan provided in person.  Telephone follow up appointment with care management team member scheduled for: 08/11/21  [name], PharmD Clinical Pharmacist [facility] [phone]  [facility] Training  1. For help or to see the videos: https://www.freestyle.abbott/us-en/support.html  2. You should have a sensor, sensor applicator, and a display device (Freestyle [place] Reader or [place]) Each sensor is good for 14 days Smart phone compatibility can be found here: https://www.freestyle.abbott/ie/en/librelink/compatibility-guide.html            3. You will still need your glucose meter If you take more than 500 mg of vitamin C per day. This may cause falsely high blood sugar readings.  Any time your symptoms do not match the reading on your [facility] Any time you don't have a number and an arrow  4. If you have a MRI or CT scan The company recommends you not wear the [facility] as it is unknown if the heat and magnetic fields could damage the components. To get the most out of your session, we advise that you try to schedule your procedure near the end of your sensor session to avoid needing an extra sensor.  5.  Apply sensors on the back of your upper arm. Change the site where you place the sensor with each new insertion. If there is adhesive residue left on the skin, you can try applying a household oil like baby oil or coconut oil.  6. Avoid injecting insulin within 3 inches of the sensor.  7. Water. The sensor is water resistant, but the receiver is not. You can still swim, shower, and take a bath. Do NOT take your sensor deeper than 3 feet (1 meter) or immerse it longer than 30 minutes in water. Your receiver needs to be in a dry area and within 20 feet of the transmitter to get readings.    8. If you want to use your smartphone instead of the [place]. You will need to create a LibreView account.   The app and the receiver do not talk to each other. You will have to set up alerts in the app as well.   9. Please call [place] if you have any questions or issues. Customer Service is available at [phone] 7 Days a Week from 8AM to 8PM (excluding holidays).  10. The sensor glucose may not always be the same as your blood glucose. Sensor glucose is a glucose estimate that lags behind the blood glucose.      11. Connect your [place] with our clinic so we can monitor your blood sugars even when you are not  in clinic. Go to your [employer] app Click "Settings" at top left  Click "Connected Apps" Click "LibreView" Add Clinic ID # to connect: primarycare  Common Concerns  Problems with Freestyle sticking? Order Skin Tac from [facility]. Alcohol swab area you plan to administer Freestyle Libre then let dry. Once dry, apply Skin Tac in a circular motion (with a spot in the middle for sensor without skin tac) and let dry. Once dry you can apply Freestyle [place]!   Problems taking off Freestyle Libre? Remember to try to shower/bathe before removing Freestyle Libre Order Tac Away to help remove any extra adhesive left  on your skin once you remove Freestyle Libre  Problems with irritation? Before applying Freestyle Libre: Apply Nasacort nasal spray (can buy over the counter) to area you will apply Freestyle Libre --> alcohol swab --> Skin Tac --> Freestyle Libre After applying Freestyle Libre: Apply hydrocortisone cream (can buy over the counter) to area until redness resolves

## 2021-07-16 NOTE — Chronic Care Management (AMB) (Signed)
Chronic Care Management Pharmacy Note  07/16/2021 Name:  Darius Smith MRN:  [id number] DOB:  05-05-1946  Summary:  Steroid Induced Hyperglycemia: Patient is taking dexamethasone weekly with rituximab as part of cancer treatment Current medications: insulin lispro (Humalog) sliding scale insulin 1-10 units four times daily (with meals and at bedtime) Patient reports only using insulin if blood glucose >180 per instructions from Dr. Moshe Cipro and this has worked well. Patient only needs to use insulin for 1-2 days after dexamethasone dose which occurs weekly at the moment but will become less frequent. Continue sliding scale insulin if blood glucose >180. Will continue to assess for long term need for insulin since dexamethasone dose frequency will decrease in November/December Long discussion provided regarding difference between blood glucose and interstitial glucose. Patient verbalizes understanding and will continue to use Freestyle Libre to monitor blood glucose. Will use finger stick blood glucose to confirm low blood glucose.   Cancer (Followed by Dr. 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 Currently on rituximab. Plans to start Revlimid soon.  Regimen contains dexamethasone which is inducing hyperglycemia. Long term plan with steroid to be determined. Will continue to follow.  Patient reports that he is not in pain and is not having nausea or vomiting.  Still has a good appetite  Subjective: Darius Smith is an 75 y.o. year old male who is a primary patient of [name], MD.  The CCM team was consulted for assistance with disease management and care coordination needs.    Engaged with patient face to face for follow up visit in response to provider referral for pharmacy case management and/or care coordination services.   Consent to Services:  The patient was given information about Chronic Care Management services,  agreed to services, and gave verbal consent prior to initiation of services.  Please see initial visit note for detailed documentation.   Patient Care Team: [name], MD as PCP - General (Internal Medicine) Harl Bowie Alphonse Guild, MD as PCP - Cardiology (Cardiology) Brien Mates, RN as Oncology Nurse Navigator (Oncology) Kassie Mends, RN as [place] Management Beryle Lathe, [facility] (Pharmacist)  Objective:  Lab Results  Component Value Date   CREATININE 1.43 (H) 06/10/2021   CREATININE 1.50 (H) 06/05/2021   CREATININE 1.59 (H) 06/04/2021    Lab Results  Component Value Date   HGBA1C 6.5 (H) 06/01/2021   Last diabetic Eye exam: No results found for: HMDIABEYEEXA  Last diabetic Foot exam: No results found for: HMDIABFOOTEX      Component Value Date/Time   CHOL 173 02/11/2021 0856   TRIG 110 02/11/2021 0856   HDL 49 02/11/2021 0856   CHOLHDL 2.9 07/31/2020 1131   LDLCALC 104 (H) 02/11/2021 0856    Hepatic Function Latest Ref Rng & Units 06/10/2021 06/02/2021 06/01/2021  Total Protein 6.5 - 8.1 g/dL 6.1(L) 5.5(L) 6.8  Albumin 3.5 - 5.0 g/dL 2.9(L) 2.7(L) 3.3(L)  AST 15 - 41 U/L '23 19 22  ' ALT 0 - 44 U/L 36 28 33  Alk Phosphatase 38 - 126 U/L 50 40 60  Total Bilirubin 0.3 - 1.2 mg/dL 0.7 1.2 0.8  Bilirubin, Direct 0.0 - 0.3 mg/dL - - -    Lab Results  Component Value Date/Time   TSH 1.441 06/18/2021 09:27 AM   TSH 1.156 08/01/2020 04:08 AM    CBC Latest Ref Rng & Units 06/10/2021 06/05/2021 06/04/2021  WBC 4.0 -  10.5 K/uL 14.4(H) 14.5(H) 11.4(H)  Hemoglobin 13.0 - 17.0 g/dL 12.9(L) 12.1(L) 12.3(L)  Hematocrit 39.0 - 52.0 % 37.4(L) 34.8(L) 36.4(L)  Platelets 150 - 400 K/uL 167 128(L) 137(L)    No results found for: VD25OH  Clinical ASCVD: No  The 10-year ASCVD risk score (Arnett DK, et al., 2019) is: 35.6%   Values used to calculate the score:     Age: 27 years     Sex: Male     Is Non-Hispanic African American: No      Diabetic: Yes     Tobacco smoker: No     Systolic Blood Pressure: 754 mmHg     Is BP treated: Yes     HDL Cholesterol: 49 mg/dL     Total Cholesterol: 173 mg/dL    Social History   Tobacco Use  Smoking Status Never  Smokeless Tobacco Never   BP Readings from Last 3 Encounters:  07/09/21 (!) 116/58  07/02/21 108/72  06/18/21 110/66   Pulse Readings from Last 3 Encounters:  07/09/21 86  07/02/21 94  06/18/21 (!) 53   Wt Readings from Last 3 Encounters:  07/09/21 181 lb 9.6 oz (82.4 kg)  07/02/21 177 lb 0.6 oz (80.3 kg)  06/18/21 175 lb (79.4 kg)    Assessment: Review of patient past medical history, allergies, medications, health status, including review of consultants reports, laboratory and other test data, was performed as part of comprehensive evaluation and provision of chronic care management services.   SDOH:  (Social Determinants of Health) assessments and interventions performed:    CCM Care Plan  Allergies  Allergen Reactions   Augmentin [Amoxicillin-Pot Clavulanate] Other (See Comments)    Increased liver enzymes Has patient had a PCN reaction causing immediate rash, facial/tongue/throat swelling, SOB or lightheadedness with hypotension: No Has patient had a PCN reaction causing severe rash involving mucus membranes or skin necrosis: No Has patient had a PCN reaction that required hospitalization: No Has patient had a PCN reaction occurring within the last 10 years: No  If all of the above answers are "NO", then may proceed with Cephalosporin use.    Flomax [Tamsulosin Hcl] Nausea Only    Dizziness  Pt is on flomax   Heparin Other (See Comments)    Tingling in face and shortness of breath    Medications Reviewed Today     Reviewed by [facility]eryle Lathe, [facility] (Pharmacist) on 07/16/21 at [place] List Status: <None>   Medication Order Taking? Sig Documenting Provider Last Dose Status Informant  acetaminophen (TYLENOL) 500 MG tablet [id number] Yes  Take 1,000 mg by mouth every 6 (six) hours as needed for moderate pain or headache. [provider] Taking Active Spouse/Significant Other  amiodarone (PACERONE) 200 MG tablet [id number] Yes Take 1 tablet (200 mg total) by mouth daily. Fenton, Clint R, PA Taking Active Spouse/Significant Other  amLODipine (NORVASC) 10 MG tablet [id number] Yes Take 1 tablet (10 mg total) by mouth daily. Arnoldo Lenis, MD Taking Active Spouse/Significant Other  apixaban (ELIQUIS) 5 MG TABS tablet [id number] Yes TAKE ONE TABLET BY MOUTH 2 TIMES A DAY. Arnoldo Lenis, MD Taking Active   blood glucose meter kit and supplies KIT [id number]  Inject 1 each into the skin 4 (four) times daily -  before meals and at bedtime. Dispense based on patient and insurance preference. Use up to four times daily as directed. Barb Merino, MD  Active   cetirizine (ZYRTEC) 10 MG tablet [id number] Yes Take 1 tablet (  10 mg total) by mouth daily as needed for allergies. Maryruth Hancock, MD Taking Active   Continuous Blood Gluc Receiver (FREESTYLE LIBRE 2 READER) DEVI [id number]  Inject 1 each into the skin in the morning, at noon, in the evening, and at bedtime. [provider]  Active   Continuous Blood Gluc Sensor (FREESTYLE LIBRE 2 SENSOR) MISC [id number]  Inject 1 each into the skin in the morning, at noon, in the evening, and at bedtime. [provider]  Active   furosemide (LASIX) 20 MG tablet [id number] Yes Take 1 tablet (20 mg total) by mouth every other day. (MAY TAKE DAILY AS NEEDED FOR INCREASED SWELLING) Branch, Alphonse Guild, MD Taking Active Spouse/Significant Other  hydrALAZINE (APRESOLINE) 100 MG tablet [id number] Yes Take 1 tablet (100 mg total) by mouth 2 (two) times daily. Arnoldo Lenis, MD Taking Active   insulin lispro (HUMALOG KWIKPEN) 100 UNIT/ML KwikPen [id number] Yes Inject 1-10 Units into the skin 4 (four) times daily -  before meals and at bedtime. Blood sugars 70-120: 1 unit 121-150: 2 units  151-200: 3 units 201-250: 4 units 251-300: 5 units 301-350: 6 units 351-400: 9 units More than 400, 10 units and call your doctor Barb Merino, MD Taking Active            Med Note Jim Like Jul 16, 2021  1:45 PM) Patient only using if blood glucose >180 per instructions from Dr. Moshe Cipro  Insulin Pen Needle 29G X 12MM MISC [id number]  1 each by Does not apply route 4 (four) times daily -  before meals and at bedtime. Fayrene Helper, MD  Active   isosorbide mononitrate (IMDUR) 30 MG 24 hr tablet [id number] Yes TAKE 1/2 TABLET BY MOUTH ONCE DAILY. Arnoldo Lenis, MD Taking Active Spouse/Significant Other  Lancets (ONETOUCH DELICA PLUS [id number]) Grenelefe [id number]  1 each by Other route 4 (four) times daily. [provider]  Active   lenalidomide (REVLIMID) 10 MG capsule [id number] No Take 10 mg by mouth. Take 10 mg on days 1-21 of a 28 day cycle.  Patient not taking: Reported on 07/16/2021   [provider] Not Taking Active   metoprolol succinate (TOPROL XL) 25 MG 24 hr tablet [id number] Yes Take 0.5 tablets (12.5 mg total) by mouth daily. Arnoldo Lenis, MD Taking Active   omeprazole (PRILOSEC) 20 MG capsule [id number] Yes Take 1 capsule (20 mg total) by mouth daily as needed (reflux). [name], MD Taking Active   [facility] ULTRA test strip [id number]  1 each by Other route 4 (four) times daily. Fayrene Helper, MD  Active   VENTOLIN HFA 108 [phone] Base) MCG/ACT inhaler [id number] Yes Inhale 2 puffs into the lungs every 6 (six) hours as needed for wheezing or shortness of breath. Fayrene Helper, MD Taking Active Spouse/Significant Other            Patient Active Problem List   Diagnosis Date Noted   Brain metastases [facility])    Hyperkalemia 06/01/2021   Secondary hypercoagulable state ([place]) 08/27/2020   Palliative care by specialist    DNR (do not resuscitate) discussion    Acute on chronic renal failure ([place]) 08/03/2020   Encounter for  support and coordination of transition of care 07/31/2020   Stage 3b chronic kidney disease ([place]) 92/42/6834   Chronic systolic HF (heart failure) ([place]) 19/62/2297   Follicular lymphoma ([place]) 06/11/2020   Atrial fibrillation ([place]) 03/20/2020   Essential hypertension 08/08/2014  GERD (gastroesophageal reflux disease) 08/08/2014    Immunization History  Administered Date(s) Administered   Fluad Quad(high Dose 65+) 06/04/2021   Influenza Nasal 06/23/2020   Influenza, High Dose Seasonal PF 05/30/2019   Influenza-Unspecified 06/21/2017, 07/03/2018, 05/30/2019, 06/23/2020   PFIZER(Purple Top)SARS-COV-2 Vaccination 11/11/2019, 12/05/2019, 07/03/2020, 07/03/2020   Pneumococcal Polysaccharide-23 06/04/2019   Tdap 04/18/2014   Zoster, Live 06/26/2013    Conditions to be addressed/monitored: Atrial Fibrillation, CHF, HTN, DMII, CKD Stage 3a, and cancer  Care Plan : Medication Management  Updates made by Beryle Lathe, [place] since 07/16/2021 12:00 AM     Problem: Hyperglycemia, Hypertension, Afib, HFimpEF, CKD3a, Cancer   Priority: High  Onset Date: 06/17/2021     Long-Range Goal: Disease Progression Prevention   Start Date: 06/17/2021  Expected End Date: 09/15/2021  Recent Progress: On track  Priority: High  Note:   Current Barriers:  Unable to independently monitor therapeutic efficacy Unable to achieve control of steroid induced hyperglycemia  Pharmacist Clinical Goal(s):  Over the next 90 days, patient will Achieve adherence to monitoring guidelines and medication adherence to achieve therapeutic efficacy Achieve control of diabetes as evidenced by improved fasting blood sugar and improved A1c through collaboration with PharmD and provider.   Interventions: 1:1 collaboration with [name], MD regarding development and update of comprehensive plan of care as evidenced by provider attestation and co-signature Inter-disciplinary care team collaboration (see  longitudinal plan of care) Comprehensive medication review performed; medication list updated in electronic medical record  Steroid Induced Hyperglycemia: Patient is taking dexamethasone weekly with rituximab as part of cancer treatment Controlled; Most recent A1c at goal of <7% per ADA guidelines Current medications: insulin lispro (Humalog) sliding scale insulin 1-10 units four times daily (with meals and at bedtime) Patient reports only using insulin if blood glucose >180 per instructions from Dr. Moshe Cipro and this has worked well. Patient only needs to use insulin for 1-2 days after dexamethasone dose which occurs weekly at the moment but will become less frequent. Denies hypoglycemic symptoms (sweaty and shaky). Hypoglycemia prevention: not indicated at this time Current meal patterns:  oatmeal or cereal for breakfast; has stopped drinking juice Current exercise: walks laps around house every morning and tries to do leg exercises 2-3 times per day to build strength up in his legs On a statin: no On aspirin 81 mg daily: no, on anticoagulation Uses Freestyle libre to monitor blood glucose (range over last week): fasting 84-124, before lunch: 119-256, bedtime: 160-267 (331 as an outlier) Continue sliding scale insulin if blood glucose >180. Will continue to assess for long term need for insulin since dexamethasone dose frequency will decrease in November/December Long discussion provided regarding difference between blood glucose and interstitial glucose. Patient verbalizes understanding and will continue to use Freestyle Libre to monitor blood glucose. Will use finger stick blood glucose to confirm low blood glucose.   Hypertension: Blood pressure under good control. Blood pressure is at goal of <130/80 mmHg per 2017 AHA/ACC guidelines. Current medications: amlodipine 10 mg by mouth once daily, metoprolol succinate 12.5 mg by mouth once daily, hydralazine 100 mg by mouth twice daily, and  furosemide 20 mg by mouth daily (may take an extra 20 mg if extra fluid overload) Also takes isosorbide 15 mg by mouth daily for heart failure  Intolerances: none Taking medications as directed: yes Side effects thought to be attributed to current medication regimen: no Current home blood pressure: unknown Encourage dietary sodium restriction/DASH diet Recommend home blood pressure monitoring to discuss  at next visit Continue current medications as above  Heart failure with improved ejection fraction (previous LVEF <40% and a follow-up LVEF >50%) - followed by Dr. Harl Bowie Appropriately managed Current treatment: metoprolol succinate 12.5 mg by mouth once daily, hydralazine 100 mg by mouth twice daily, isosorbide mononitrate 15 by mouth daily, and furosemide 20 mg by mouth daily (may take an extra 20 mg if extra fluid overload) Stage C (Symptomatic heart failure)/NYHA Class II (Slight limitation of physical activity. Comfortable at rest. Ordinary physical activity results in fatigue, palpitation, or dyspnea) Most recent echocardiogram: LVEF 50% (11/26/20) which was improved from LVEF 20-25% (06/12/20) Current home blood pressure: unknown Current home weight: stable in the 170s Encourage dietary sodium restriction (<3 g/day) Educated on the importance of weighing daily. Patient aware to contact cardiology/primary care team if weight gain >3 lbs in 1 day or >5 lbs in 1 week Follow-up with cardiology Continue current medications as above  Atrial Fibrillation - followed by Dr. Harl Bowie Controlled. Most recent ECG: sinus bradycardia. Current rate and rhythm control: metoprolol succinate 12.5 mg by mouth once daily and amiodarone 200 mg by mouth once daily Metoprolol recently discontinued and restarted at lower dose of 12.5 mg by mouth daily  Anticoagulation: apixaban (Eliquis) 5 mg by mouth twice daily  CHADS2VASc score: 5 - Age (2 points), Heart failure history (1 point), Hypertension history (1  point), and Diabetes history (1 point) Prior ablation: no History of cardioversion:  yes, multiple Denies signs and symptoms of bleeding Continue metoprolol succinate 12.5 mg by mouth once daily and amiodarone 200 mg by mouth once daily Continue apixaban (Eliquis) 5 mg by mouth twice daily  Recommend home blood pressure and heart rate monitoring to discuss at next visit Discussed need for medication compliance  Chronic Kidney Disease Stage 3a - GFR 45-59 (Mild to Moderately Reduced Function): Appropriately managed Current medications: furosemide 20 mg by mouth daily (may take additional 20 mg if extra fluid overload) Intolerances: none Taking medications as directed: yes Side effects thought to be attributed to current medication regimen: no Most recent GFR: 51 mL/min Most recent microalbumin: n/a Recommend adequate hydration   Cancer (Followed by Dr. 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 Currently on rituximab. Plans to start Revlimid soon.  Regimen contains dexamethasone which is inducing hyperglycemia. Long term plan with steroid to be determined. Will continue to follow.  Patient reports that he is not in pain and is not having nausea or vomiting.  Still has a good appetite  Patient Goals/Self-Care Activities Over the next 90 days, patient will:  Take medications as prescribed Check blood sugar continuously with continuous glucose monitor, document, and provide at future appointments Check blood pressure at least once daily, document, and provide at future appointments Weigh daily, and contact provider if weight gain of more than 3 lbs in 1 day or more than 5 lbs in 1 week  Follow Up Plan: Telephone follow up appointment with care management team member scheduled for: 08/11/21      Medication Assistance: None required.  Patient affirms current coverage meets needs.  Patient's preferred pharmacy is:  [facility], [place] [address] Phone: [phone] Fax: [phone]  Follow Up:  Patient agrees to Care Plan and Follow-up.  Plan: Telephone follow up appointment with care management team member scheduled for:  08/11/21  [name], PharmD Clinical Pharmacist [facility] [phone]

## 2021-07-20 DIAGNOSIS — E119 Type 2 diabetes mellitus without complications: Secondary | ICD-10-CM | POA: Diagnosis not present

## 2021-07-20 DIAGNOSIS — I10 Essential (primary) hypertension: Secondary | ICD-10-CM | POA: Diagnosis not present

## 2021-07-20 DIAGNOSIS — I4891 Unspecified atrial fibrillation: Secondary | ICD-10-CM

## 2021-07-20 DIAGNOSIS — I5022 Chronic systolic (congestive) heart failure: Secondary | ICD-10-CM

## 2021-07-22 ENCOUNTER — Ambulatory Visit: Payer: Medicare Other | Admitting: Cardiology

## 2021-07-22 ENCOUNTER — Telehealth: Payer: Self-pay

## 2021-07-22 DIAGNOSIS — C8590 Non-Hodgkin lymphoma, unspecified, unspecified site: Secondary | ICD-10-CM | POA: Diagnosis not present

## 2021-07-22 DIAGNOSIS — C8589 Other specified types of non-Hodgkin lymphoma, extranodal and solid organ sites: Secondary | ICD-10-CM | POA: Diagnosis not present

## 2021-07-22 DIAGNOSIS — Z5111 Encounter for antineoplastic chemotherapy: Secondary | ICD-10-CM | POA: Diagnosis not present

## 2021-07-22 NOTE — Telephone Encounter (Signed)
425 pm.  Request received from [name], NP to contact patient for follow up.  Phone call made to patient but no answer.  Message has been left requesting a call back.

## 2021-07-23 ENCOUNTER — Other Ambulatory Visit: Payer: Self-pay

## 2021-07-23 ENCOUNTER — Other Ambulatory Visit: Payer: Medicare Other

## 2021-07-23 DIAGNOSIS — Z515 Encounter for palliative care: Secondary | ICD-10-CM

## 2021-07-23 NOTE — Progress Notes (Signed)
PATIENT NAME: Darius Smith DOB: [birth year] MRN: [id number]  PRIMARY CARE PROVIDER: [name], MD  RESPONSIBLE PARTY:  Acct ID - Guarantor Home Phone Work Phone Relationship Acct Type  [id number] - Cage,[place]ford* [phone]  Self P/F     [address], [place], [place] [address]   Due to the COVID-19 crisis, this visit was done via telemedicine from my office and it was initiated and consent by this patient and or family.  I connected with  [name] OR PROXY on 07/23/21  telephonically and verified that I am speaking with the correct person using two identifiers.   I discussed the limitations of evaluation and management by telemedicine. The patient expressed understanding and agreed to proceed.   PLAN OF CARE and INTERVENTIONS:               1.  GOALS OF CARE/ ADVANCE CARE PLANNING:  Continue with chemo treatments unless significant sickness occurs.                2.  PATIENT/CAREGIVER EDUCATION:  Palliative Care follow up.                4. PERSONAL EMERGENCY PLAN:  Activate 911 for emergencies.                5.  DISEASE STATUS:  Connected with wife Taron Mondor telephonically to follow up on patient's overall condition and symptom management needs.  Patient was inpatient at [facility] last month from 06/25/21-06/29/21.  Wife reported last fall occurred while in the hospital.  Patient is using a walker in the home and wheelchair outside of the home.  Noted weakness to lower extremities.   Patient has now received 3 of 4 chemo treatments and continues on oral regimen daily.  No side effects are reported by wife.    Overall, she feels patient is doing very well.  Patient was experiencing frequent headaches and was given 1 L of fluid yesterday.  Wife reports 1/4 inch more fluid to ankles and a 4 lb weight gain.  She has administered a diuretic this am.  Patient has no complaints of shortness of breath.   No issues with insomnia and also napping during the daytime.   Denies any issues  with pain and no nausea/vomiting.  Re-enforced Palliative Care services.  Next scheduled visit is  December 1st with [name], NP.    HISTORY OF PRESENT ILLNESS:  75 year old male with history of CHF and non-Hodgkin's Lymphoma.  Patient is being followed by Palliative Care every 4-8 weeks and PRN.   CODE STATUS: DNR ADVANCED DIRECTIVES: N MOST FORM: No PPS: 50%        Lorenza Burton, RN

## 2021-07-29 DIAGNOSIS — Z5111 Encounter for antineoplastic chemotherapy: Secondary | ICD-10-CM | POA: Diagnosis not present

## 2021-07-29 DIAGNOSIS — C8589 Other specified types of non-Hodgkin lymphoma, extranodal and solid organ sites: Secondary | ICD-10-CM | POA: Diagnosis not present

## 2021-08-04 ENCOUNTER — Ambulatory Visit (INDEPENDENT_AMBULATORY_CARE_PROVIDER_SITE_OTHER): Payer: Medicare Other | Admitting: *Deleted

## 2021-08-04 DIAGNOSIS — C8258 Diffuse follicle center lymphoma, lymph nodes of multiple sites: Secondary | ICD-10-CM

## 2021-08-04 DIAGNOSIS — I5022 Chronic systolic (congestive) heart failure: Secondary | ICD-10-CM

## 2021-08-04 NOTE — Patient Instructions (Signed)
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  Darius Smith reports pt requires assistance with bathing, walking, cooking, etc, has needed DME in the home and uses walker and cane for ambulation.  Patient is to see oncologist tomorrow 11/16 at [facility] and will be having MRI in the near future to further assess plan of care for cancer.  Pt is eating well, has all medications and taking as prescribed.  Patient just finished chemotherapy last week and is now taking oral medication Revlimid and tolerating well so far.  Patient does try to walk through the house to stay active, is sleeping well, did receive flu vaccine.  Wife is very supportive and very involved with patient's care. Patient does weigh daily and record. RNCM Clinical Goal(s):  Patient/ spouse will verbalize understanding of disease states CHF, Cancer as evidenced by pt/ spouse report, review of EHR,  through collaboration with RN Care manager, provider, and care team.  Patient will take all medications as prescribed as evidenced by pt/ spouse report, review of EHR and collaboration with CCM pharmacist.  Interventions: 1:1 collaboration with primary care provider regarding development and update of comprehensive plan of care as evidenced by provider attestation and co-signature Inter-disciplinary care team collaboration (see longitudinal plan of  care) Evaluation of current treatment plan related to  self management and patient's adherence to plan as established by provider  Heart Failure Interventions: Basic overview and discussion of pathophysiology of Heart Failure reviewed; Provided education on low sodium diet; Reviewed Heart Failure Action Plan in depth and provided written copy; Assessed need for readable accurate scales in home; Provided education about placing scale on hard, flat surface; Advised patient to weigh each morning after emptying bladder; Discussed importance of daily weight and advised patient to weigh and record daily; Discussed the importance of keeping all appointments with provider; Screening for signs and symptoms of depression related to chronic disease state;  Assessed social determinant of health barriers;   Oncology:  (Status: New goal.) Assessment of understanding of oncology diagnosis:  Assessed patient understanding of cancer diagnosis and recommended treatment plan, Reviewed upcoming provider appointments and treatment appointments, Assessed available transportation to appointments and treatments. Has consistent/reliable transportation: Yes, Assessed support system. Has consistent/reliable family or other support: Yes, PHQ2/PHQ9 performed, and Nutrition assessment performed  Reviewed importance of staying well, handwashing, wearing a mask as needed, avoiding sick persons Reviewed energy conservation Assessed that patient does have palliative care currently Reviewed upcoming scheduled appointments  Patient Goals/Self-Care Activities: Patient will self administer medications as prescribed as evidenced by self report/primary caregiver report  Patient will attend all scheduled provider appointments as evidenced by clinician review of documented attendance to scheduled appointments and patient/caregiver report Patient will call provider office for new concerns or questions as evidenced by review of  documented incoming telephone call notes and patient report Continue to weigh daily and  record Call your doctor for 3 pound weight gain overnight or 5 pound weight gain in one week Follow low sodium diet- read food labels Follow heart failure action plan- copy sent via My [facility] good handwashing and wear a mask as needed Avoid sick persons Continue to work with palliative care and call them for questions or concerns Call RN care manager if you have questions at [phone]   Follow Up Plan:  Telephone follow up appointment with care management team member scheduled for:  09/10/2021       Consent to CCM Services: Darius Smith was given information about Chronic Care Management services including:  CCM service includes personalized support from designated clinical staff supervised by his physician, including individualized plan of care and coordination with other care providers 24/7 contact phone numbers for assistance for urgent and routine care needs. Service will only be billed when office clinical staff spend 20 minutes or more in a month to coordinate care. Only one practitioner may furnish and bill the service in a calendar month. The patient may stop CCM services at any time (effective at the end of the month) by phone call to the office staff. The patient will be responsible for cost sharing (co-pay) of up to 20% of the service fee (after annual deductible is met).  Patient agreed to services and verbal consent obtained.   Patient verbalizes understanding of instructions provided today and agrees to view in [place].   Telephone follow up appointment with care management team member scheduled for:   09/10/2021  Heart Failure Action Plan A heart failure action plan helps you understand what to do when you have symptoms of heart failure. Your action plan is a color-coded plan that lists the symptoms to watch for and indicates what actions to take. If you have symptoms in the  red zone, you need medical care right away. If you have symptoms in the yellow zone, you are having problems. If you have symptoms in the green zone, you are doing well. Follow the plan that was created by you and your health care provider. Review your plan each time you visit your health care provider. Red zone These signs and symptoms mean you should get medical help right away: You have trouble breathing when resting. You have a dry cough that is getting worse. You have swelling or pain in your legs or abdomen that is getting worse. You suddenly gain more than 2-3 lb (0.9-1.4 kg) in 24 hours, or more than 5 lb (2.3 kg) in a week. This amount may be more or less depending on your condition. You have trouble staying awake or you feel confused. You have chest pain. You do not have an appetite. You pass out. You have worsening sadness or depression. If you have any of these symptoms, call your local emergency services (911 in the [place] right away. Do not drive yourself to the hospital. Yellow zone These signs and symptoms mean your condition may be getting worse and you should make some changes: You have trouble breathing when you are active, or you need to sleep with your head raised on extra pillows to help you breathe. You have swelling in your legs or abdomen. You gain 2-3 lb (0.9-1.4 kg) in 24 hours, or 5 lb (2.3 kg) in a week. This amount may be more or less depending on your condition. You get tired easily. You have trouble sleeping. You have a dry cough. If you have any of these symptoms: Contact  your health care provider within the next day. Your health care provider may adjust your medicines. Green zone These signs mean you are doing well and can continue what you are doing: You do not have shortness of breath. You have very little swelling or no new swelling. Your weight is stable (no gain or loss). You have a normal activity level. You do not have chest pain or any other  new symptoms. Follow these instructions at home: Take over-the-counter and prescription medicines only as told by your health care provider. Weigh yourself daily. Your target weight is __________ lb (__________ kg). Call your health care provider if you gain more than __________ lb (__________ kg) in 24 hours, or more than __________ lb (__________ kg) in a week. Health care provider name: _____________________________________________________ Health care provider phone number: _____________________________________________________ Eat a heart-healthy diet. Work with a diet and nutrition specialist (dietitian) to create an eating plan that is best for you. Keep all follow-up visits. This is important. Where to find more information American Heart Association: www.heart.org Summary A heart failure action plan helps you understand what to do when you have symptoms of heart failure. Follow the action plan that was created by you and your health care provider. Get help right away if you have any symptoms in the red zone. This information is not intended to replace advice given to you by your health care provider. Make sure you discuss any questions you have with your health care provider. Document Revised: 04/21/2020 Document Reviewed: 04/21/2020 Elsevier Patient Education  2022 [place]. Low-Sodium Eating Plan Sodium, which is an element that makes up salt, helps you maintain a healthy balance of fluids in your body. Too much sodium can increase your blood pressure and cause fluid and waste to be held in your body. Your health care provider or dietitian may recommend following this plan if you have high blood pressure (hypertension), kidney disease, liver disease, or heart failure. Eating less sodium can help lower your blood pressure, reduce swelling, and protect your heart, liver, and kidneys. What are tips for following this plan? Reading food labels The Nutrition Facts label lists the  amount of sodium in one serving of the food. If you eat more than one serving, you must multiply the listed amount of sodium by the number of servings. Choose foods with less than 140 mg of sodium per serving. Avoid foods with 300 mg of sodium or more per serving. Shopping  Look for lower-sodium products, often labeled as "low-sodium" or "no salt added." Always check the sodium content, even if foods are labeled as "unsalted" or "no salt added." Buy fresh foods. Avoid canned foods and pre-made or frozen meals. Avoid canned, cured, or processed meats. Buy breads that have less than 80 mg of sodium per slice. Cooking  Eat more home-cooked food and less restaurant, buffet, and fast food. Avoid adding salt when cooking. Use salt-free seasonings or herbs instead of table salt or sea salt. Check with your health care provider or pharmacist before using salt substitutes. Cook with plant-based oils, such as canola, sunflower, or olive oil. Meal planning When eating at a restaurant, ask that your food be prepared with less salt or no salt, if possible. Avoid dishes labeled as brined, pickled, cured, smoked, or made with soy sauce, miso, or teriyaki sauce. Avoid foods that contain MSG (monosodium glutamate). MSG is sometimes added to [place] food, bouillon, and some canned foods. Make meals that can be grilled, baked, poached, roasted, or steamed.  These are generally made with less sodium. General information Most people on this plan should limit their sodium intake to 1,500-2,000 mg (milligrams) of sodium each day. What foods should I eat? Fruits Fresh, frozen, or canned fruit. Fruit juice. Vegetables Fresh or frozen vegetables. "No salt added" canned vegetables. "No salt added" tomato sauce and paste. Low-sodium or reduced-sodium tomato and vegetable juice. Grains Low-sodium cereals, including oats, puffed wheat and rice, and shredded wheat. Low-sodium crackers. Unsalted rice. Unsalted pasta.  Low-sodium bread. Whole-grain breads and whole-grain pasta. Meats and other proteins Fresh or frozen (no salt added) meat, poultry, seafood, and fish. Low-sodium canned tuna and salmon. Unsalted nuts. Dried peas, beans, and lentils without added salt. Unsalted canned beans. Eggs. Unsalted nut butters. Dairy Milk. Soy milk. Cheese that is naturally low in sodium, such as ricotta cheese, fresh mozzarella, or Swiss cheese. Low-sodium or reduced-sodium cheese. Cream cheese. Yogurt. Seasonings and condiments Fresh and dried herbs and spices. Salt-free seasonings. Low-sodium mustard and ketchup. Sodium-free salad dressing. Sodium-free light mayonnaise. Fresh or refrigerated horseradish. Lemon juice. Vinegar. Other foods Homemade, reduced-sodium, or low-sodium soups. Unsalted popcorn and pretzels. Low-salt or salt-free chips. The items listed above may not be a complete list of foods and beverages you can eat. Contact a dietitian for more information. What foods should I avoid? Vegetables Sauerkraut, pickled vegetables, and relishes. Olives. [place] fries. Onion rings. Regular canned vegetables (not low-sodium or reduced-sodium). Regular canned tomato sauce and paste (not low-sodium or reduced-sodium). Regular tomato and vegetable juice (not low-sodium or reduced-sodium). Frozen vegetables in sauces. Grains Instant hot cereals. Bread stuffing, pancake, and biscuit mixes. Croutons. Seasoned rice or pasta mixes. Noodle soup cups. Boxed or frozen macaroni and cheese. Regular salted crackers. Self-rising flour. Meats and other proteins Meat or fish that is salted, canned, smoked, spiced, or pickled. Precooked or cured meat, such as sausages or meat loaves. Berniece Salines. Ham. Pepperoni. Hot dogs. Corned beef. Chipped beef. Salt pork. Jerky. Pickled herring. Anchovies and sardines. Regular canned tuna. Salted nuts. Dairy Processed cheese and cheese spreads. Hard cheeses. Cheese curds. Blue cheese. Feta cheese. String  cheese. Regular cottage cheese. Buttermilk. Canned milk. Fats and oils Salted butter. Regular margarine. Ghee. Bacon fat. Seasonings and condiments Onion salt, garlic salt, seasoned salt, table salt, and sea salt. Canned and packaged gravies. Worcestershire sauce. Tartar sauce. Barbecue sauce. Teriyaki sauce. Soy sauce, including reduced-sodium. Steak sauce. Fish sauce. Oyster sauce. Cocktail sauce. Horseradish that you find on the shelf. Regular ketchup and mustard. Meat flavorings and tenderizers. Bouillon cubes. Hot sauce. Pre-made or packaged marinades. Pre-made or packaged taco seasonings. Relishes. Regular salad dressings. Salsa. Other foods Salted popcorn and pretzels. Corn chips and puffs. Potato and tortilla chips. Canned or dried soups. Pizza. Frozen entrees and pot pies. The items listed above may not be a complete list of foods and beverages you should avoid. Contact a dietitian for more information. Summary Eating less sodium can help lower your blood pressure, reduce swelling, and protect your heart, liver, and kidneys. Most people on this plan should limit their sodium intake to 1,500-2,000 mg (milligrams) of sodium each day. Canned, boxed, and frozen foods are high in sodium. Restaurant foods, fast foods, and pizza are also very high in sodium. You also get sodium by adding salt to food. Try to cook at home, eat more fresh fruits and vegetables, and eat less fast food and canned, processed, or prepared foods. This information is not intended to replace advice given to you by your health care provider. Make  sure you discuss any questions you have with your health care provider. Document Revised: 10/12/2019 Document Reviewed: 08/08/2019 Elsevier Patient Education  2022 [place]   Jacqlyn Larsen [facility], BSN RN Case Advertising copywriter Primary [facility] [phone]

## 2021-08-04 NOTE — Chronic Care Management (AMB) (Signed)
Chronic Care Management   CCM RN Visit Note  08/04/2021 Name: Darius Smith MRN: [id number] DOB: 15-Oct-1945  Subjective: Darius Smith is a 75 y.o. year old male who is a primary care patient of [name], MD. The care management team was consulted for assistance with disease management and care coordination needs.    Engaged with patient by telephone for initial visit in response to provider referral for case management and/or care coordination services.   Consent to Services:  The patient was given the following information about Chronic Care Management services today, agreed to services, and gave verbal consent: 1. CCM service includes personalized support from designated clinical staff supervised by the primary care provider, including individualized plan of care and coordination with other care providers 2. 24/7 contact phone numbers for assistance for urgent and routine care needs. 3. Service will only be billed when office clinical staff spend 20 minutes or more in a month to coordinate care. 4. Only one practitioner may furnish and bill the service in a calendar month. 5.The patient may stop CCM services at any time (effective at the end of the month) by phone call to the office staff. 6. The patient will be responsible for cost sharing (co-pay) of up to 20% of the service fee (after annual deductible is met). Patient agreed to services and consent obtained.  Patient agreed to services and verbal consent obtained.   Assessment: Review of patient past medical history, allergies, medications, health status, including review of consultants reports, laboratory and other test data, was performed as part of comprehensive evaluation and provision of chronic care management services.   SDOH (Social Determinants of Health) assessments and interventions performed:  SDOH Interventions    Flowsheet Row Most Recent Value  SDOH Interventions   Food Insecurity Interventions Intervention Not  Indicated  Transportation Interventions Intervention Not Indicated        CCM Care Plan  Allergies  Allergen Reactions   Augmentin [Amoxicillin-Pot Clavulanate] Other (See Comments)    Increased liver enzymes Has patient had a PCN reaction causing immediate rash, facial/tongue/throat swelling, SOB or lightheadedness with hypotension: No Has patient had a PCN reaction causing severe rash involving mucus membranes or skin necrosis: No Has patient had a PCN reaction that required hospitalization: No Has patient had a PCN reaction occurring within the last 10 years: No  If all of the above answers are "NO", then may proceed with Cephalosporin use.    Flomax [Tamsulosin Hcl] Nausea Only    Dizziness  Pt is on flomax   Heparin Other (See Comments)    Tingling in face and shortness of breath    Outpatient Encounter Medications as of 08/04/2021  Medication Sig Note   acetaminophen (TYLENOL) 500 MG tablet Take 1,000 mg by mouth every 6 (six) hours as needed for moderate pain or headache.    amiodarone (PACERONE) 200 MG tablet Take 1 tablet (200 mg total) by mouth daily.    amLODipine (NORVASC) 10 MG tablet Take 1 tablet (10 mg total) by mouth daily.    apixaban (ELIQUIS) 5 MG TABS tablet TAKE ONE TABLET BY MOUTH 2 TIMES A DAY.    blood glucose meter kit and supplies KIT Inject 1 each into the skin 4 (four) times daily -  before meals and at bedtime. Dispense based on patient and insurance preference. Use up to four times daily as directed.    cetirizine (ZYRTEC) 10 MG tablet Take 1 tablet (10 mg total) by mouth daily  as needed for allergies.    Continuous Blood Gluc Receiver (FREESTYLE LIBRE 2 READER) DEVI Inject 1 each into the skin in the morning, at noon, in the evening, and at bedtime.    furosemide (LASIX) 20 MG tablet Take 1 tablet (20 mg total) by mouth every other day. (MAY TAKE DAILY AS NEEDED FOR INCREASED SWELLING)    hydrALAZINE (APRESOLINE) 100 MG tablet Take 1 tablet (100 mg  total) by mouth 2 (two) times daily.    insulin lispro (HUMALOG KWIKPEN) 100 UNIT/ML KwikPen Inject 1-10 Units into the skin 4 (four) times daily -  before meals and at bedtime. Blood sugars 70-120: 1 unit 121-150: 2 units 151-200: 3 units 201-250: 4 units 251-300: 5 units 301-350: 6 units 351-400: 9 units More than 400, 10 units and call your doctor 07/16/2021: Patient only using if blood glucose >180 per instructions from Dr. Lodema Hong   Insulin Pen Needle 29G X MISC 1 each by Does not apply route 4 (four) times daily -  before meals and at bedtime.    isosorbide mononitrate (IMDUR) 30 MG 24 hr tablet TAKE 1/2 TABLET BY MOUTH ONCE DAILY.    Lancets (ONETOUCH DELICA PLUS LANCET33G) MISC 1 each by Other route 4 (four) times daily.    lenalidomide (REVLIMID) 10 MG capsule Take 10 mg by mouth. Take 10 mg on days 1-21 of a 28 day cycle.    metoprolol succinate (TOPROL XL) 25 MG 24 hr tablet Take 0.5 tablets (12.5 mg total) by mouth daily.    omeprazole (PRILOSEC) 20 MG capsule Take 1 capsule (20 mg total) by mouth daily as needed (reflux).    ONETOUCH ULTRA test strip 1 each by Other route 4 (four) times daily.    VENTOLIN HFA 108 (90 Base) MCG/ACT inhaler Inhale 2 puffs into the lungs every 6 (six) hours as needed for wheezing or shortness of breath.    Continuous Blood Gluc Sensor (FREESTYLE LIBRE 2 SENSOR) MISC Inject 1 each into the skin in the morning, at noon, in the evening, and at bedtime. (Patient not taking: Reported on 08/04/2021)    No facility-administered encounter medications on file as of 08/04/2021.    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   Atrial fibrillation (HCC) 03/20/2020   Essential hypertension 08/08/2014   GERD (gastroesophageal reflux disease) 08/08/2014    Conditions to be addressed/monitored:CHF and Cancer  Care Plan : RN Care Manager plan of care  Updates made by Audrie Gallus, RN since 08/04/2021 12:00 AM     Problem: No plan of care established for management of chronic disease states (CHF, cancer, A-fib, HTN)   Priority: High     Long-Range Goal: Development of plan of care for chronic disease management (CHF, cancer, A-fib, HTN)   Priority: High  Note:   Current Barriers:  Knowledge Deficits related to plan of care for management of CHF and Cancer (follicular lymphoma)  Patient lives with wife Darius Smith and she provides oversight for all of patient's care, they have adult children.  Darius Smith reports pt requires assistance with bathing, walking, cooking, etc, has needed DME in the home and uses walker and cane for ambulation.  Patient is to see oncologist tomorrow 11/16 at [facility] and will be having MRI in the near future to further assess plan of care for cancer.  Pt is eating well, has all medications and taking as prescribed.  Patient just finished chemotherapy last week and is now taking oral medication Revlimid and tolerating well so far.  Patient does try to walk through the house to stay active, is sleeping well, did receive flu vaccine.  Wife is very supportive and very involved with patient's care. Patient does weigh daily and record. RNCM Clinical Goal(s):  Patient/ spouse will verbalize understanding of disease states CHF, Cancer as evidenced by pt/ spouse report, review of EHR,  through collaboration with RN Care manager, provider, and care team.  Patient will take all medications as prescribed as evidenced by pt/ spouse report, review of EHR and collaboration with CCM  pharmacist.  Interventions: 1:1 collaboration with primary care provider regarding development and update of comprehensive plan of care as evidenced by provider attestation and co-signature Inter-disciplinary care team collaboration (see longitudinal plan of care) Evaluation of current treatment plan related to  self management and patient's adherence to plan as established by provider  Heart Failure Interventions: Basic overview and discussion of pathophysiology of Heart Failure reviewed; Provided education on low sodium diet; Reviewed Heart Failure Action Plan in depth and provided written copy; Assessed need for readable accurate scales in home; Provided education about placing scale on hard, flat surface; Advised patient to weigh each morning after emptying bladder; Discussed importance of daily weight and advised patient to weigh and record daily; Discussed the importance of keeping all appointments with provider; Screening for signs and symptoms of depression related to chronic disease state;  Assessed social determinant of health barriers;   Oncology:  (Status: New goal.) Assessment of understanding of oncology diagnosis:  Assessed patient understanding of cancer diagnosis and recommended treatment plan, Reviewed upcoming provider appointments and treatment appointments, Assessed available transportation to appointments and treatments. Has consistent/reliable transportation: Yes, Assessed support system. Has consistent/reliable family or other support: Yes, PHQ2/PHQ9 performed, and Nutrition assessment performed  Reviewed importance of staying well, handwashing, wearing a mask as needed, avoiding sick persons Reviewed energy conservation Assessed that patient does have palliative care currently Reviewed upcoming scheduled appointments  Patient Goals/Self-Care Activities: Patient will self administer medications as prescribed as evidenced by self report/primary caregiver report   Patient will attend all scheduled provider appointments as evidenced by clinician review of documented attendance to scheduled appointments and patient/caregiver report Patient will call provider office for new concerns or questions as evidenced by review of documented incoming telephone call notes and patient report Continue to weigh daily and record Call your doctor for 3 pound weight gain overnight or 5 pound weight gain in one week Follow low sodium diet- read food labels Follow heart failure action plan- copy sent via [facility] good handwashing and wear a mask as needed Avoid sick persons Continue to work with palliative care and call them for questions or concerns Call RN care manager if you have questions at [phone]   Follow Up Plan:  Telephone follow up appointment with care management team member scheduled for:  09/10/2021       Plan:Telephone follow up appointment with care management team member scheduled for:  09/10/2021  Jacqlyn Larsen [facility], BSN RN Case Manager [facility]  [phone]          

## 2021-08-05 DIAGNOSIS — C8250 Diffuse follicle center lymphoma, unspecified site: Secondary | ICD-10-CM | POA: Diagnosis not present

## 2021-08-05 DIAGNOSIS — C8590 Non-Hodgkin lymphoma, unspecified, unspecified site: Secondary | ICD-10-CM | POA: Diagnosis not present

## 2021-08-10 DIAGNOSIS — I10 Essential (primary) hypertension: Secondary | ICD-10-CM | POA: Diagnosis not present

## 2021-08-11 ENCOUNTER — Telehealth: Payer: Medicare Other

## 2021-08-11 LAB — LIPID PANEL WITH LDL/HDL RATIO
Cholesterol, Total: 177 mg/dL (ref 100–199)
HDL: 50 mg/dL (ref >39)
LDL Chol Calc (NIH): 107 mg/dL — ABNORMAL HIGH (ref 0–99)
LDL/HDL Ratio: 2.1 ratio (ref 0.0–3.6)
Triglycerides: 108 mg/dL (ref 0–149)
VLDL Cholesterol Cal: 20 mg/dL (ref 5–40)

## 2021-08-11 LAB — CMP14+EGFR
ALT: 17 IU/L (ref 0–44)
AST: 11 IU/L (ref 0–40)
Albumin/Globulin Ratio: 1.8 (ref 1.2–2.2)
Albumin: 3.8 g/dL (ref 3.7–4.7)
Alkaline Phosphatase: 67 IU/L (ref 44–121)
BUN/Creatinine Ratio: 8 — ABNORMAL LOW (ref 10–24)
BUN: 13 mg/dL (ref 8–27)
Bilirubin Total: 0.4 mg/dL (ref 0.0–1.2)
CO2: 21 mmol/L (ref 20–29)
Calcium: 9.1 mg/dL (ref 8.6–10.2)
Chloride: 109 mmol/L — ABNORMAL HIGH (ref 96–106)
Creatinine, Ser: 1.65 mg/dL — ABNORMAL HIGH (ref 0.76–1.27)
Globulin, Total: 2.1 g/dL (ref 1.5–4.5)
Glucose: 98 mg/dL (ref 70–99)
Potassium: 4.7 mmol/L (ref 3.5–5.2)
Sodium: 144 mmol/L (ref 134–144)
Total Protein: 5.9 g/dL — ABNORMAL LOW (ref 6.0–8.5)
eGFR: 43 mL/min/{1.73_m2} — ABNORMAL LOW (ref >59)

## 2021-08-11 LAB — CBC WITH DIFFERENTIAL/PLATELET
Basophils Absolute: 0.1 10*3/uL (ref 0.0–0.2)
Basos: 1 %
EOS (ABSOLUTE): 0.5 10*3/uL — ABNORMAL HIGH (ref 0.0–0.4)
Eos: 6 %
Hematocrit: 38.3 % (ref 37.5–51.0)
Hemoglobin: 12.4 g/dL — ABNORMAL LOW (ref 13.0–17.7)
Immature Grans (Abs): 0.1 10*3/uL (ref 0.0–0.1)
Immature Granulocytes: 1 %
Lymphocytes Absolute: 2.1 10*3/uL (ref 0.7–3.1)
Lymphs: 27 %
MCH: 30 pg (ref 26.6–33.0)
MCHC: 32.4 g/dL (ref 31.5–35.7)
MCV: 93 fL (ref 79–97)
Monocytes Absolute: 1 10*3/uL — ABNORMAL HIGH (ref 0.1–0.9)
Monocytes: 13 %
Neutrophils Absolute: 4.1 10*3/uL (ref 1.4–7.0)
Neutrophils: 52 %
Platelets: 261 10*3/uL (ref 150–450)
RBC: 4.13 x10E6/uL — ABNORMAL LOW (ref 4.14–5.80)
RDW: 13.6 % (ref 11.6–15.4)
WBC: 7.8 10*3/uL (ref 3.4–10.8)

## 2021-08-18 ENCOUNTER — Telehealth: Payer: Medicare Other

## 2021-08-19 ENCOUNTER — Ambulatory Visit: Payer: Medicare Other | Admitting: Pharmacist

## 2021-08-19 DIAGNOSIS — E0865 Diabetes mellitus due to underlying condition with hyperglycemia: Secondary | ICD-10-CM

## 2021-08-19 DIAGNOSIS — I130 Hypertensive heart and chronic kidney disease with heart failure and stage 1 through stage 4 chronic kidney disease, or unspecified chronic kidney disease: Secondary | ICD-10-CM | POA: Diagnosis not present

## 2021-08-19 DIAGNOSIS — I5022 Chronic systolic (congestive) heart failure: Secondary | ICD-10-CM

## 2021-08-19 DIAGNOSIS — Z794 Long term (current) use of insulin: Secondary | ICD-10-CM

## 2021-08-19 DIAGNOSIS — I4891 Unspecified atrial fibrillation: Secondary | ICD-10-CM

## 2021-08-19 DIAGNOSIS — C8258 Diffuse follicle center lymphoma, lymph nodes of multiple sites: Secondary | ICD-10-CM

## 2021-08-19 DIAGNOSIS — T387X5D Adverse effect of androgens and anabolic congeners, subsequent encounter: Secondary | ICD-10-CM

## 2021-08-19 DIAGNOSIS — I10 Essential (primary) hypertension: Secondary | ICD-10-CM

## 2021-08-19 DIAGNOSIS — N1832 Chronic kidney disease, stage 3b: Secondary | ICD-10-CM

## 2021-08-19 DIAGNOSIS — C7931 Secondary malignant neoplasm of brain: Secondary | ICD-10-CM

## 2021-08-19 DIAGNOSIS — E119 Type 2 diabetes mellitus without complications: Secondary | ICD-10-CM

## 2021-08-19 NOTE — Patient Instructions (Signed)
[name],  It was great to talk to you today!  Please call me with any questions or concerns.   Visit Information  Following are the goals we discussed today:  Patient Goals/Self-Care Activities Over the next 90 days, patient will:  Take medications as prescribed Check blood sugar continuously with continuous glucose monitor, document, and provide at future appointments Check blood pressure at least once daily, document, and provide at future appointments Weigh daily, and contact provider if weight gain of more than 3 lbs in 1 day or more than 5 lbs in 1 week  Plan: Face to Face appointment with care management team member scheduled for: 09/16/21  [name], PharmD, [facility] Clinical Pharmacist [facility] [phone]   Please call the care guide team at [phone] if you need to cancel or reschedule your appointment.   Patient verbalizes understanding of instructions provided today and agrees to view in [facility].

## 2021-08-19 NOTE — Chronic Care Management (AMB) (Signed)
Chronic Care Management Pharmacy Note  08/19/2021 Name:  Darius Smith MRN:  [id number] DOB:  Apr 20, 1946  Summary:  Steroid Induced Hyperglycemia: Patient is intermittently taking dexamethasone weekly with rituximab as part of cancer treatment. Last infusion was 07/29/21. Patient currently on oral chemo with plans to restart infusion/steroid in January 2023.  Patient reports only using insulin if blood glucose >180 per instructions from Dr. Moshe Cipro and this has worked well. Patient only needs to use insulin for 1-2 days after dexamethasone dose. Last dose of insulin needed was 07/30/21. Uses Freestyle libre to monitor blood glucose (range over last week): 85-115 Previously discussed with patient and wife difference between blood glucose and interstitial glucose. Patient verbalized understanding and plans to continue to use Freestyle Libre to monitor blood glucose. Will use finger stick blood glucose to confirm low blood glucose.   Heart failure with improved ejection fraction (previous LVEF <40% and a follow-up LVEF >50%) - followed by Dr. Harl Bowie Potassium recently restarted given low potassium blood levels likely due to furosemide  Cancer (Followed by Dr. 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 Currently on Revlimid. Per wife, patient will likely restart rituximab infusion in Jan 2023. Rituximab regimen contains dexamethasone which is inducing hyperglycemia. Long term plan with steroid to be determined. Will continue to follow.  Patient reports that he is not in pain and is not having nausea or vomiting.  Still has a good appetite  Subjective: Darius Smith is an 75 y.o. year old male who is a primary patient of [name], MD.  The CCM team was consulted for assistance with disease management and care coordination needs.    Engaged with patient's caregiver (wife: [name]) by telephone for follow up visit in response to provider  referral for pharmacy case management and/or care coordination services.   Consent to Services:  The patient was given information about Chronic Care Management services, agreed to services, and gave verbal consent prior to initiation of services.  Please see initial visit note for detailed documentation.   Patient Care Team: [name], MD as PCP - General (Internal Medicine) Harl Bowie Alphonse Guild, MD as PCP - Cardiology (Cardiology) Brien Mates, RN as Oncology Nurse Navigator (Oncology) Kassie Mends, RN as [place] [facility] Beryle Lathe, [facility] (Pharmacist)  Objective:  Lab Results  Component Value Date   CREATININE 1.65 (H) 08/10/2021   CREATININE 1.43 (H) 06/10/2021   CREATININE 1.50 (H) 06/05/2021    Lab Results  Component Value Date   HGBA1C 6.5 (H) 06/01/2021   Last diabetic Eye exam: No results found for: HMDIABEYEEXA  Last diabetic Foot exam: No results found for: HMDIABFOOTEX      Component Value Date/Time   CHOL 177 08/10/2021 0833   TRIG 108 08/10/2021 0833   HDL 50 08/10/2021 0833   CHOLHDL 2.9 07/31/2020 1131   [place] 107 (H) 08/10/2021 0833    Hepatic Function Latest Ref Rng & Units 08/10/2021 06/10/2021 06/02/2021  Total Protein 6.0 - 8.5 g/dL 5.9(L) 6.1(L) 5.5(L)  Albumin 3.7 - 4.7 g/dL 3.8 2.9(L) 2.7(L)  AST 0 - 40 IU/L _0 ALT 0 - 44 IU/L 17 36 28  Alk Phosphatase 44 - 121 IU/L 67 50 40  Total Bilirubin 0.0 - 1.2 mg/dL 0.4 0.7 1.2  Bilirubin, Direct 0.0 - 0.3 mg/dL - - -    Lab Results  Component Value Date/Time   TSH 1.441  06/18/2021 09:27 AM   TSH 1.156 08/01/2020 04:08 AM    CBC Latest Ref Rng & Units 08/10/2021 06/10/2021 06/05/2021  WBC 3.4 - 10.8 x10E3/uL 7.8 14.4(H) 14.5(H)  Hemoglobin 13.0 - 17.7 g/dL 12.4(L) 12.9(L) 12.1(L)  Hematocrit 37.5 - 51.0 % 38.3 37.4(L) 34.8(L)  Platelets 150 - 450 x10E3/uL 261 167 128(L)    No results found for: VD25OH  Clinical ASCVD: No  The 10-year ASCVD  risk score (Arnett DK, et al., 2019) is: 37%   Values used to calculate the score:     Age: 34 years     Sex: Male     Is Non-Hispanic African American: No     Diabetic: Yes     Tobacco smoker: No     Systolic Blood Pressure: 007 mmHg     Is BP treated: Yes     HDL Cholesterol: 50 mg/dL     Total Cholesterol: 177 mg/dL    Social History   Tobacco Use  Smoking Status Never  Smokeless Tobacco Never   BP Readings from Last 3 Encounters:  07/09/21 (!) 116/58  07/02/21 108/72  06/18/21 110/66   Pulse Readings from Last 3 Encounters:  07/09/21 86  07/02/21 94  06/18/21 (!) 53   Wt Readings from Last 3 Encounters:  07/09/21 181 lb 9.6 oz (82.4 kg)  07/02/21 177 lb 0.6 oz (80.3 kg)  06/18/21 175 lb (79.4 kg)    Assessment: Review of patient past medical history, allergies, medications, health status, including review of consultants reports, laboratory and other test data, was performed as part of comprehensive evaluation and provision of chronic care management services.   SDOH:  (Social Determinants of Health) assessments and interventions performed:    CCM Care Plan  Allergies  Allergen Reactions   Flomax [Tamsulosin Hcl] Nausea Only    Dizziness  Pt is on flomax   Heparin Other (See Comments)    Tingling in face and shortness of breath    Medications Reviewed Today     Reviewed by Kassie Mends, RN (Registered Nurse) on 08/04/21 at 1141  Med List Status: <None>   Medication Order Taking? Sig Documenting Provider Last Dose Status Informant  acetaminophen (TYLENOL) 500 MG tablet [id number] Yes Take 1,000 mg by mouth every 6 (six) hours as needed for moderate pain or headache. [provider] Taking Active Spouse/Significant Other  amiodarone (PACERONE) 200 MG tablet [id number] Yes Take 1 tablet (200 mg total) by mouth daily. Fenton, Clint R, PA Taking Active Spouse/Significant Other  amLODipine (NORVASC) 10 MG tablet [id number] Yes Take 1 tablet (10 mg total)  by mouth daily. Arnoldo Lenis, MD Taking Active Spouse/Significant Other  apixaban (ELIQUIS) 5 MG TABS tablet [id number] Yes TAKE ONE TABLET BY MOUTH 2 TIMES A DAY. Arnoldo Lenis, MD Taking Active   blood glucose meter kit and supplies KIT [id number] Yes Inject 1 each into the skin 4 (four) times daily -  before meals and at bedtime. Dispense based on patient and insurance preference. Use up to four times daily as directed. Barb Merino, MD Taking Active   cetirizine (ZYRTEC) 10 MG tablet [id number] Yes Take 1 tablet (10 mg total) by mouth daily as needed for allergies. Maryruth Hancock, MD Taking Active   Continuous Blood Gluc Receiver (FREESTYLE LIBRE 2 READER) DEVI [id number] Yes Inject 1 each into the skin in the morning, at noon, in the evening, and at bedtime. [provider] Taking Active   Continuous Blood Gluc Sensor (FREESTYLE [place]  2 SENSOR) MISC [id number] No Inject 1 each into the skin in the morning, at noon, in the evening, and at bedtime.  Patient not taking: Reported on 08/04/2021   [provider] Not Taking Active   furosemide (LASIX) 20 MG tablet [id number] Yes Take 1 tablet (20 mg total) by mouth every other day. (MAY TAKE DAILY AS NEEDED FOR INCREASED SWELLING) Branch, Alphonse Guild, MD Taking Active Spouse/Significant Other  hydrALAZINE (APRESOLINE) 100 MG tablet [id number] Yes Take 1 tablet (100 mg total) by mouth 2 (two) times daily. Arnoldo Lenis, MD Taking Active   insulin lispro (HUMALOG KWIKPEN) 100 UNIT/ML KwikPen [id number] Yes Inject 1-10 Units into the skin 4 (four) times daily -  before meals and at bedtime. Blood sugars 70-120: 1 unit 121-150: 2 units 151-200: 3 units 201-250: 4 units 251-300: 5 units 301-350: 6 units 351-400: 9 units More than 400, 10 units and call your doctor Barb Merino, MD Taking Active            Med Note Jim Like Jul 16, 2021  1:45 PM) Patient only using if blood glucose >180 per instructions from Dr.  Moshe Cipro  Insulin Pen Needle 29G X 12MM MISC [id number] Yes 1 each by Does not apply route 4 (four) times daily -  before meals and at bedtime. Fayrene Helper, MD Taking Active   isosorbide mononitrate (IMDUR) 30 MG 24 hr tablet [id number] Yes TAKE 1/2 TABLET BY MOUTH ONCE DAILY. Arnoldo Lenis, MD Taking Active Spouse/Significant Other  Lancets (ONETOUCH DELICA PLUS FTDDUK02R) [place] [id number] Yes 1 each by Other route 4 (four) times daily. [provider] Taking Active   lenalidomide (REVLIMID) 10 MG capsule [id number] Yes Take 10 mg by mouth. Take 10 mg on days 1-21 of a 28 day cycle. [provider] Taking Active   metoprolol succinate (TOPROL XL) 25 MG 24 hr tablet [id number] Yes Take 0.5 tablets (12.5 mg total) by mouth daily. Arnoldo Lenis, MD Taking Active   omeprazole (PRILOSEC) 20 MG capsule [id number] Yes Take 1 capsule (20 mg total) by mouth daily as needed (reflux). [name], MD Taking Active   [facility] ULTRA test strip [id number] Yes 1 each by Other route 4 (four) times daily. Fayrene Helper, MD Taking Active   VENTOLIN HFA 108 [phone] Base) MCG/ACT inhaler [id number] Yes Inhale 2 puffs into the lungs every 6 (six) hours as needed for wheezing or shortness of breath. Fayrene Helper, MD Taking Active Spouse/Significant Other            Patient Active Problem List   Diagnosis Date Noted   Brain metastases [facility])    Hyperkalemia 06/01/2021   Secondary hypercoagulable state ([place]) 08/27/2020   Palliative care by specialist    DNR (do not resuscitate) discussion    Acute on chronic renal failure ([place]) 08/03/2020   Encounter for support and coordination of transition of care 07/31/2020   Stage 3b chronic kidney disease ([place]) 89/38/1017   Chronic systolic HF (heart failure) ([place]) 51/10/5850   Follicular lymphoma ([place]) 06/11/2020   Atrial fibrillation ([place]) 03/20/2020   Essential hypertension 08/08/2014   GERD (gastroesophageal reflux disease)  08/08/2014    Immunization History  Administered Date(s) Administered   Fluad Quad(high Dose 65+) 06/04/2021   Influenza Nasal 06/23/2020   Influenza, High Dose Seasonal PF 05/30/2019   Influenza-Unspecified 06/21/2017, 07/03/2018, 05/30/2019, 06/23/2020   PFIZER(Purple Top)SARS-COV-2 Vaccination 11/11/2019, 12/05/2019, 07/03/2020, 07/03/2020   Pneumococcal Polysaccharide-23 06/04/2019   Tdap  04/18/2014   Zoster, Live 06/26/2013    Conditions to be addressed/monitored: Atrial Fibrillation, CHF, HTN, DMII, CKD Stage 3a, and cancer  Care Plan : Medication Management  Updates made by Beryle Lathe, [place] since 08/19/2021 12:00 AM     Problem: Hyperglycemia, Hypertension, Afib, HFimpEF, CKD3a, Cancer   Priority: High  Onset Date: 06/17/2021     Long-Range Goal: Disease Progression Prevention   Start Date: 06/17/2021  Expected End Date: 09/15/2021  Recent Progress: On track  Priority: High  Note:   Current Barriers:  Unable to independently monitor therapeutic efficacy Unable to achieve control of steroid induced hyperglycemia  Pharmacist Clinical Goal(s):  Over the next 90 days, patient will Achieve adherence to monitoring guidelines and medication adherence to achieve therapeutic efficacy Achieve control of diabetes as evidenced by improved fasting blood sugar and improved A1c through collaboration with PharmD and provider.   Interventions: 1:1 collaboration with [name], MD regarding development and update of comprehensive plan of care as evidenced by provider attestation and co-signature Inter-disciplinary care team collaboration (see longitudinal plan of care) Comprehensive medication review performed; medication list updated in electronic medical record  Steroid Induced Hyperglycemia: Patient is intermittently taking dexamethasone weekly with rituximab as part of cancer treatment. Last infusion was 07/29/21. Patient currently on oral chemo with plans to  restart infusion/steroid in January 2023.  Controlled; Most recent A1c at goal of <7% per ADA guidelines Current medications: insulin lispro (Humalog) sliding scale insulin 1-10 units four times daily (with meals and at bedtime) Patient reports only using insulin if blood glucose >180 per instructions from Dr. Moshe Cipro and this has worked well. Patient only needs to use insulin for 1-2 days after dexamethasone dose. Last dose of insulin needed was 07/30/21. Denies hypoglycemic symptoms (sweaty and shaky). Hypoglycemia prevention: not indicated at this time Current meal patterns:  oatmeal or cereal for breakfast; has stopped drinking juice; notes improvement in appetite Current exercise: walks laps around house every morning and tries to do leg exercises 2-3 times per day to build strength up in his legs On a statin: no On aspirin 81 mg daily: no, on anticoagulation Uses Freestyle libre to monitor blood glucose (range over last week): 85-115 Continue sliding scale insulin if blood glucose >180. Will continue to assess for long term need for insulin Previously discussed with patient and wife difference between blood glucose and interstitial glucose. Patient verbalized understanding and plans to continue to use Freestyle Libre to monitor blood glucose. Will use finger stick blood glucose to confirm low blood glucose.   Hypertension: Blood pressure under good control. Blood pressure is at goal of <130/80 mmHg per 2017 AHA/ACC guidelines. Current medications: amlodipine 10 mg by mouth once daily, metoprolol succinate 12.5 mg by mouth once daily, hydralazine 100 mg by mouth twice daily, and furosemide 20 mg by mouth daily (may take an extra 20 mg if extra fluid overload) Potassium recently restarted given low potassium blood levels likely due to furosemide Also takes isosorbide 15 mg by mouth daily for heart failure  Intolerances: none Taking medications as directed: yes Side effects thought to be  attributed to current medication regimen: no Current home blood pressure: unknown Encourage dietary sodium restriction/DASH diet Recommend home blood pressure monitoring to discuss at next visit Continue current medications as above  Heart failure with improved ejection fraction (previous LVEF <40% and a follow-up LVEF >50%) - followed by Dr. Harl Bowie Appropriately managed Current treatment: metoprolol succinate 12.5 mg by mouth once daily, hydralazine 100  mg by mouth twice daily, isosorbide mononitrate 15 by mouth daily, and furosemide 20 mg by mouth daily (may take an extra 20 mg if extra fluid overload) Potassium recently restarted given low potassium blood levels likely due to furosemide Stage C (Symptomatic heart failure)/NYHA Class II (Slight limitation of physical activity. Comfortable at rest. Ordinary physical activity results in fatigue, palpitation, or dyspnea) Most recent echocardiogram: LVEF 50% (11/26/20) which was improved from LVEF 20-25% (06/12/20) Current home blood pressure: unknown Current home weight: stable in the 170-180s Encourage dietary sodium restriction (<3 g/day) Educated on the importance of weighing daily. Patient aware to contact cardiology/primary care team if weight gain >3 lbs in 1 day or >5 lbs in 1 week Follow-up with cardiology Continue current medications as above  Atrial Fibrillation - followed by Dr. Harl Bowie Controlled. Most recent ECG: sinus bradycardia. Current rate and rhythm control: metoprolol succinate 12.5 mg by mouth once daily and amiodarone 200 mg by mouth once daily Metoprolol recently discontinued and restarted at lower dose of 12.5 mg by mouth daily  Anticoagulation: apixaban (Eliquis) 5 mg by mouth twice daily  CHADS2VASc score: 5 - Age (2 points), Heart failure history (1 point), Hypertension history (1 point), and Diabetes history (1 point) Prior ablation: no History of cardioversion:  yes, multiple Denies signs and symptoms of  bleeding Continue metoprolol succinate 12.5 mg by mouth once daily and amiodarone 200 mg by mouth once daily Continue apixaban (Eliquis) 5 mg by mouth twice daily  Recommend home blood pressure and heart rate monitoring to discuss at next visit Discussed need for medication compliance  Chronic Kidney Disease Stage 3a - GFR 45-59 (Mild to Moderately Reduced Function): Appropriately managed Current medications: furosemide 20 mg by mouth daily (may take additional 20 mg if extra fluid overload) Potassium recently restarted given low potassium blood levels likely due to furosemide Intolerances: none Taking medications as directed: yes Side effects thought to be attributed to current medication regimen: no Most recent GFR: 51 mL/min Most recent microalbumin: n/a Recommend adequate hydration   Cancer (Followed by Dr. 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 Currently on Revlimid. Per wife, patient will likely restart rituximab infusion in Jan 2023. Rituximab regimen contains dexamethasone which is inducing hyperglycemia. Long term plan with steroid to be determined. Will continue to follow.  Patient reports that he is not in pain and is not having nausea or vomiting.  Still has a good appetite  Patient Goals/Self-Care Activities Over the next 90 days, patient will:  Take medications as prescribed Check blood sugar continuously with continuous glucose monitor, document, and provide at future appointments Check blood pressure at least once daily, document, and provide at future appointments Weigh daily, and contact provider if weight gain of more than 3 lbs in 1 day or more than 5 lbs in 1 week  Follow Up Plan: Face to Face appointment with care management team member scheduled for: 09/16/21      Medication Assistance: None required.  Patient affirms current coverage meets needs.  Patient's preferred pharmacy is:  [facility] [place] [address] Phone: [phone] Fax: [phone]  Follow Up:  Patient agrees to Care Plan and Follow-up.  Plan: Face to Face appointment with care management team member scheduled for: 09/16/21  [name], PharmD, [facility] Clinical Pharmacist [facility] [phone]

## 2021-08-20 ENCOUNTER — Other Ambulatory Visit: Payer: Self-pay

## 2021-08-20 ENCOUNTER — Ambulatory Visit: Payer: Medicare Other | Admitting: Internal Medicine

## 2021-08-20 ENCOUNTER — Telehealth: Payer: Self-pay | Admitting: Nurse Practitioner

## 2021-08-20 ENCOUNTER — Ambulatory Visit: Payer: Medicare Other | Admitting: Family Medicine

## 2021-08-20 ENCOUNTER — Other Ambulatory Visit: Payer: Medicare Other | Admitting: Nurse Practitioner

## 2021-08-20 NOTE — Telephone Encounter (Signed)
I called Darius Smith to confirm pc f/u visit. Darius Smith endorses they have a conflicting appointment. Discussed will have PC RN do follow up call in 4 weeks then schedule PC NP appointment in 12 weeks if needed or sooner if changes. Darius Smith in agreement

## 2021-08-26 NOTE — Progress Notes (Signed)
I think you saw him recently, and will see him again 12/22.

## 2021-09-02 DIAGNOSIS — Z888 Allergy status to other drugs, medicaments and biological substances status: Secondary | ICD-10-CM | POA: Diagnosis not present

## 2021-09-02 DIAGNOSIS — R202 Paresthesia of skin: Secondary | ICD-10-CM | POA: Diagnosis not present

## 2021-09-02 DIAGNOSIS — R911 Solitary pulmonary nodule: Secondary | ICD-10-CM | POA: Diagnosis not present

## 2021-09-02 DIAGNOSIS — C8589 Other specified types of non-Hodgkin lymphoma, extranodal and solid organ sites: Secondary | ICD-10-CM | POA: Diagnosis not present

## 2021-09-02 DIAGNOSIS — C8250 Diffuse follicle center lymphoma, unspecified site: Secondary | ICD-10-CM | POA: Diagnosis not present

## 2021-09-02 DIAGNOSIS — Z9221 Personal history of antineoplastic chemotherapy: Secondary | ICD-10-CM | POA: Diagnosis not present

## 2021-09-02 DIAGNOSIS — C8510 Unspecified B-cell lymphoma, unspecified site: Secondary | ICD-10-CM | POA: Diagnosis not present

## 2021-09-02 DIAGNOSIS — R251 Tremor, unspecified: Secondary | ICD-10-CM | POA: Diagnosis not present

## 2021-09-02 DIAGNOSIS — Z880 Allergy status to penicillin: Secondary | ICD-10-CM | POA: Diagnosis not present

## 2021-09-10 ENCOUNTER — Ambulatory Visit (INDEPENDENT_AMBULATORY_CARE_PROVIDER_SITE_OTHER): Payer: Medicare Other | Admitting: *Deleted

## 2021-09-10 DIAGNOSIS — C8258 Diffuse follicle center lymphoma, lymph nodes of multiple sites: Secondary | ICD-10-CM

## 2021-09-10 DIAGNOSIS — I5022 Chronic systolic (congestive) heart failure: Secondary | ICD-10-CM

## 2021-09-10 NOTE — Patient Instructions (Addendum)
Visit Information  Thank you for taking time to visit with me today. Please don't hesitate to contact me if I can be of assistance to you before our next scheduled telephone appointment.  Following are the goals we discussed today:  Patient will self administer medications as prescribed as evidenced by self report/primary caregiver report  Patient will attend all scheduled provider appointments as evidenced by clinician review of documented attendance to scheduled appointments and patient/caregiver report Patient will call provider office for new concerns or questions as evidenced by review of documented incoming telephone call notes and patient report Continue to weigh daily and record Call your doctor for 3 pound weight gain overnight or 5 pound weight gain in one week Follow low sodium diet- read food labels, avoid fast food Practice good handwashing and wear a mask as needed Avoid sick persons Alternate activity with rest Continue to work with palliative care and call them for questions or concerns Call RN care manager if you have questions at [phone]   Our next appointment is by telephone on 10/27/21 at 1045 am  Please call the care guide team at [phone] if you need to cancel or reschedule your appointment.   If you are experiencing a Mental Health or [place] or need someone to talk to, please call the [place] National Suicide Prevention Lifeline: (773)627-9697 or TTY: (619) 117-5330 TTY 775-099-5192) to talk to a trained counselor call 1-800-273-TALK (toll free, 24 hour hotline) go to [facility] [address] [place] [phone]) call the [place]: [phone] call 911   Patient verbalizes understanding of instructions provided today and agrees to view in [place].   Jacqlyn Larsen [facility], BSN RN Case Manager [place] [facility] [phone]

## 2021-09-10 NOTE — Chronic Care Management (AMB) (Signed)
Chronic Care Management   CCM RN Visit Note  09/10/2021 Name: Darius Smith MRN: [id number] DOB: 01-Sep-1946  Subjective: [name] is a 75 y.o. year old male who is a primary care patient of [name], MD. The care management team was consulted for assistance with disease management and care coordination needs.    Engaged with patient by telephone for follow up visit in response to provider referral for case management and/or care coordination services.   Consent to Services:  The patient was given information about Chronic Care Management services, agreed to services, and gave verbal consent prior to initiation of services.  Please see initial visit note for detailed documentation.   Patient agreed to services and verbal consent obtained.   Assessment: Review of patient past medical history, allergies, medications, health status, including review of consultants reports, laboratory and other test data, was performed as part of comprehensive evaluation and provision of chronic care management services.   SDOH (Social Determinants of Health) assessments and interventions performed:    CCM Care Plan  Allergies  Allergen Reactions   Flomax [Tamsulosin Hcl] Nausea Only    Dizziness  Pt is on flomax   Heparin Other (See Comments)    Tingling in face and shortness of breath    Outpatient Encounter Medications as of 09/10/2021  Medication Sig Note   acetaminophen (TYLENOL) 500 MG tablet Take 1,000 mg by mouth every 6 (six) hours as needed for moderate pain or headache.    amiodarone (PACERONE) 200 MG tablet Take 1 tablet (200 mg total) by mouth daily.    amLODipine (NORVASC) 10 MG tablet Take 1 tablet (10 mg total) by mouth daily.    apixaban (ELIQUIS) 5 MG TABS tablet TAKE ONE TABLET BY MOUTH 2 TIMES A DAY.    blood glucose meter kit and supplies KIT Inject 1 each into the skin 4 (four) times daily -  before meals and at bedtime. Dispense based on patient and insurance  preference. Use up to four times daily as directed.    cetirizine (ZYRTEC) 10 MG tablet Take 1 tablet (10 mg total) by mouth daily as needed for allergies.    Continuous Blood Gluc Receiver (FREESTYLE LIBRE 2 READER) DEVI Inject 1 each into the skin in the morning, at noon, in the evening, and at bedtime.    Continuous Blood Gluc Sensor (FREESTYLE LIBRE 2 SENSOR) MISC Inject 1 each into the skin in the morning, at noon, in the evening, and at bedtime.    furosemide (LASIX) 20 MG tablet Take 1 tablet (20 mg total) by mouth every other day. (MAY TAKE DAILY AS NEEDED FOR INCREASED SWELLING)    hydrALAZINE (APRESOLINE) 100 MG tablet Take 1 tablet (100 mg total) by mouth 2 (two) times daily.    insulin lispro (HUMALOG KWIKPEN) 100 UNIT/ML KwikPen Inject 1-10 Units into the skin 4 (four) times daily -  before meals and at bedtime. Blood sugars 70-120: 1 unit 121-150: 2 units 151-200: 3 units 201-250: 4 units 251-300: 5 units 301-350: 6 units 351-400: 9 units More than 400, 10 units and call your doctor 07/16/2021: Patient only using if blood glucose >180 per instructions from Dr. Moshe Cipro   Insulin Pen Needle 29G X 12MM MISC 1 each by Does not apply route 4 (four) times daily -  before meals and at bedtime.    isosorbide mononitrate (IMDUR) 30 MG 24 hr tablet TAKE 1/2 TABLET BY MOUTH ONCE DAILY.    Lancets ([place]  LANCET33G) MISC 1 each by Other route 4 (four) times daily.    lenalidomide (REVLIMID) 10 MG capsule Take 10 mg by mouth. Take 10 mg on days 1-21 of a 28 day cycle.    metoprolol succinate (TOPROL XL) 25 MG 24 hr tablet Take 0.5 tablets (12.5 mg total) by mouth daily.    omeprazole (PRILOSEC) 20 MG capsule Take 1 capsule (20 mg total) by mouth daily as needed (reflux).    ONETOUCH ULTRA test strip 1 each by Other route 4 (four) times daily.    Potassium Chloride Crys ER (KLOR-CON M20 PO) Take 1 tablet by mouth daily.    VENTOLIN HFA 108 (90 Base) MCG/ACT inhaler Inhale 2 puffs into  the lungs every 6 (six) hours as needed for wheezing or shortness of breath.    No facility-administered encounter medications on file as of 09/10/2021.    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   Atrial fibrillation ([place]) 03/20/2020   Essential hypertension 08/08/2014   GERD (gastroesophageal reflux disease) 08/08/2014    Conditions to be addressed/monitored:CHF and Cancer  Care Plan : RN Care Manager plan of care  Updates made by Kassie Mends, RN since 09/10/2021 12:00 AM     Problem: No plan of care established for management of chronic disease states (CHF, cancer, A-fib, HTN)   Priority: High     Long-Range Goal: Development of plan of care for chronic disease management (CHF, cancer, A-fib, HTN)   Start Date: 08/04/2021  Expected End Date: 03/09/2022  Priority: High  Note:   Current Barriers:  Knowledge Deficits related to plan of care for management of CHF and Cancer (follicular lymphoma)  Patient lives with wife Darius Smith and she provides oversight for all of patient's care, they have adult children.  Darius Smith reports pt requires assistance with bathing, walking, cooking, etc, has needed DME in the home and uses walker and cane for ambulation.  Patient is to saw oncologist on 11/16 at [facility] and had MRI to further assess plan of care for cancer, started chemotherapy infusions and had 4 treatments total in October and November and will restart 09/30/21 and continues Revlimid oral med 21 days on and 7 days off, per spouse, pt then  had brain MRI and full body PET scan on 12/44/22 and states there is "improvement in different parts of his body"  Pt is eating well, has all medications and taking as prescribed, denies pain and nausea. Patient does try to walk through the house to stay active, is sleeping well, did receive flu vaccine.  Wife is very supportive and very involved with patient's care. Patient does weigh daily and record, weight today 175 pounds.  Patient now has [employer] (when receiving steroids with chemotherapy increases blood sugar) spouse reports fasting readings 88-100 ranges, evening readings 114-121 RNCM Clinical Goal(s):  Patient/ spouse will verbalize understanding of disease states CHF, Cancer as evidenced by pt/ spouse report, review of EHR,  through collaboration with RN Care manager, provider, and care team.  Patient will take all  medications as prescribed as evidenced by pt/ spouse report, review of EHR and collaboration with CCM pharmacist.  Interventions: 1:1 collaboration with primary care provider regarding development and update of comprehensive plan of care as evidenced by provider attestation and co-signature Inter-disciplinary care team collaboration (see longitudinal plan of care) Evaluation of current treatment plan related to  self management and patient's adherence to plan as established by provider  Heart Failure Interventions: Discussed importance of daily weight and advised patient to weigh and record daily Discussed the importance of keeping all appointments with provider Reinforced heart failure action plan and importance of calling doctor early on for change in health status, symptoms with emphasis on yellow zone Reinforced low sodium diet Reviewed importance of taking medications as prescribed  Oncology:  (Status: New goal.) Assessment of understanding of oncology diagnosis:  Assessed patient understanding of cancer diagnosis and recommended treatment plan and Reviewed  upcoming provider appointments and treatment appointments  Reviewed infection precautions and importance of staying well, handwashing, wearing a mask as needed, avoiding sick persons Reviewed energy conservation Assessed with spouse that patient does have palliative care currently- nurse continues to call Reviewed upcoming scheduled appointments Reviewed drinking adequate fluids and eating healthy diet Pain assessment completed Reviewed energy conservation - alternate activity with rest  Patient Goals/Self-Care Activities: Patient will self administer medications as prescribed as evidenced by self report/primary caregiver report  Patient will attend all scheduled provider appointments as evidenced by clinician review of documented attendance to scheduled appointments and patient/caregiver report Patient will call provider office for new concerns or questions as evidenced by review of documented incoming telephone call notes and patient report Continue to weigh daily and record Call your doctor for 3 pound weight gain overnight or 5 pound weight gain in one week Follow low sodium diet- read food labels, avoid fast food Practice good handwashing and wear a mask as needed Avoid sick persons Alternate activity with rest Continue to work with palliative care and call them for questions or concerns Call RN care manager if you have questions at [phone]   Follow Up Plan:  Telephone follow up appointment with care management team member scheduled for:  10/27/2021       Plan:Telephone follow up appointment with care management team member scheduled for:  10/27/2021  Jacqlyn Larsen [facility], BSN RN Case Manager [facility] [phone]

## 2021-09-16 ENCOUNTER — Other Ambulatory Visit: Payer: Self-pay

## 2021-09-16 ENCOUNTER — Ambulatory Visit (INDEPENDENT_AMBULATORY_CARE_PROVIDER_SITE_OTHER): Payer: Medicare Other | Admitting: Internal Medicine

## 2021-09-16 ENCOUNTER — Encounter: Payer: Self-pay | Admitting: Internal Medicine

## 2021-09-16 ENCOUNTER — Ambulatory Visit: Payer: Medicare Other | Admitting: Pharmacist

## 2021-09-16 VITALS — BP 124/62 | HR 53 | Resp 18 | Ht 72.0 in | Wt 182.1 lb

## 2021-09-16 DIAGNOSIS — N1832 Chronic kidney disease, stage 3b: Secondary | ICD-10-CM

## 2021-09-16 DIAGNOSIS — C8258 Diffuse follicle center lymphoma, lymph nodes of multiple sites: Secondary | ICD-10-CM | POA: Diagnosis not present

## 2021-09-16 DIAGNOSIS — I4891 Unspecified atrial fibrillation: Secondary | ICD-10-CM

## 2021-09-16 DIAGNOSIS — I5022 Chronic systolic (congestive) heart failure: Secondary | ICD-10-CM

## 2021-09-16 DIAGNOSIS — I10 Essential (primary) hypertension: Secondary | ICD-10-CM | POA: Diagnosis not present

## 2021-09-16 DIAGNOSIS — E119 Type 2 diabetes mellitus without complications: Secondary | ICD-10-CM

## 2021-09-16 NOTE — Assessment & Plan Note (Addendum)
Had recent CNS lymphoma, followed by Heme/Onc. At [facility] - on lenalidomide and Rituximab, responding well Was given Dexamethasone, which decreased size of CNS lesions. Last Oncology visit note reviewed.

## 2021-09-16 NOTE — Assessment & Plan Note (Addendum)
On Amiodarone, Metoprolol and Eliquis Followed by Cardiology

## 2021-09-16 NOTE — Progress Notes (Signed)
Established Patient Office Visit  Subjective:  Patient ID: Darius Smith, male    DOB: 16-May-1946  Age: 75 y.o. MRN: [id number]  CC:  Chief Complaint  Patient presents with   Follow-up    6 month follow up HTN and labs    HPI Darius Smith is a 75 y.o. male with past medical history of HTN, atrial fibrillation, HFrEF, GERD, CKD stage IIIb, follicular lymphoma with CNS lesions who presents for f/u of his chronic medical conditions.  HTN and A Fib.: BP is well-controlled. Takes medications regularly. Patient denies headache, dizziness, chest pain, dyspnea or palpitations.  Follicular lymphoma: He is followed by heme-onc at [facility].  He is on chemotherapy currently and is responding well.  He denies any dizziness currently.  Denies any nausea, vomiting, fatigue, abdominal pain or recent change in weight or appetite.  Stage IIIb CKD: Last BMP reviewed.  He agrees to increase fluid intake for now.  Denies any dysuria or hematuria currently.  Past Medical History:  Diagnosis Date   Allergic rhinitis 07/02/2019   Arthritis    both hands   Atrial fibrillation ([place]) 10/28/2017   Dr. Harl Bowie   Atrial fibrillation with RVR ([place]) 07/14/2020   Blood in urine    occ   Bronchitis    Cancer ([place])    Basal cell   Cardiomegaly 10/28/2017   CHF (congestive heart failure) ([place])    Complication of anesthesia    Mr. Krejci developed a. fib in recovery after cysto procedure 10/2017 was transferred to [facility] and underwent successful cardioversion   Diabetes [facility])    Diverticulosis of sigmoid colon 08/2017   Noted on colonoscopy   Dysrhythmia    External hemorrhoids 08/2017   GERD (gastroesophageal reflux disease)    History of colon polyps 08/2017   History of kidney stones    History of rectal bleeding    History of right inguinal hernia    Hypertension    Lung cancer ([place]) 06/25/2020   Lymphoma ([place])    Nephrolithiasis 07/02/2019   Rectal bleeding 09/02/2017   Added automatically from  request for surgery [id number]   Shortness of breath    Stage 3 chronic kidney disease [facility])     Past Surgical History:  Procedure Laterality Date   AXILLARY LYMPH NODE BIOPSY Left 06/05/2021   Procedure: AXILLARY LYMPH NODE BIOPSY;  Surgeon: Virl Cagey, MD;  Location: [facility] ORS;  Service: General;  Laterality: Left;   CARDIOVERSION     10/2017   CARDIOVERSION N/A 07/16/2020   Procedure: CARDIOVERSION;  Surgeon: Arnoldo Lenis, MD;  Location: [facility] ENDO SUITE;  Service: Endoscopy;  Laterality: N/A;   CARDIOVERSION N/A 08/21/2020   Procedure: CARDIOVERSION;  Surgeon: Satira Sark, MD;  Location: [facility] ORS;  Service: Cardiovascular;  Laterality: N/A;   CARDIOVERSION N/A 09/10/2020   Procedure: CARDIOVERSION;  Surgeon: Josue Hector, MD;  Location: [facility] ENDOSCOPY;  Service: Cardiovascular;  Laterality: N/A;   CHOLECYSTECTOMY     CLEFT LIP REPAIR     several from childhood til 75 years old   [place]     several from childhood til 76 years old   COLONOSCOPY  04/28/2011   Procedure: COLONOSCOPY;  Surgeon: Rogene Houston, MD;  Location: [facility] ENDO SUITE;  Service: Endoscopy;  Laterality: N/A;   COLONOSCOPY N/A 07/18/2014   Procedure: COLONOSCOPY;  Surgeon: Rogene Houston, MD;  Location: [facility] ENDO SUITE;  Service: Endoscopy;  Laterality: N/A;  830   COLONOSCOPY  N/A 09/09/2017   Procedure: COLONOSCOPY;  Surgeon: Rogene Houston, MD;  Location: [facility] ENDO SUITE;  Service: Endoscopy;  Laterality: N/A;  955   CYSTOSCOPY/URETEROSCOPY/HOLMIUM LASER/STENT PLACEMENT Bilateral 12/06/2017   Procedure: CYSTOSCOPY/RETROGRADE/URETEROSCOPY/HOLMIUM LASER/STENT EXCHANGE;  Surgeon: Festus Aloe, MD;  Location: [facility] ORS;  Service: Urology;  Laterality: Bilateral;  ONLY NEEDS 60 MIN   HERNIA REPAIR     LYMPH NODE BIOPSY Left    Under left arm   POLYPECTOMY  09/09/2017   Procedure: POLYPECTOMY;  Surgeon: Rogene Houston, MD;  Location: [facility] ENDO SUITE;  Service: Endoscopy;;  colon   vocal cord  surgery      Family History  Problem Relation Age of Onset   Breast cancer Mother    Prostate cancer Father    COPD Sister     Social History   Socioeconomic History   Marital status: Married    Spouse name: Not on file   Number of children: 2   Years of education: Not on file   Highest education level: Not on file  Occupational History   Occupation: retired  Tobacco Use   Smoking status: Never   Smokeless tobacco: Never  Vaping Use   Vaping Use: Never used  Substance and Sexual Activity   Alcohol use: Not Currently    Alcohol/week: 7.0 standard drinks    Types: 7 Cans of beer per week   Drug use: No   Sexual activity: Not on file  Other Topics Concern   Not on file  Social History Narrative   Lives with wife [name] of 28 years April 15, 2020      2 children, 2 grandchildren   Dog: Gardner Candle      Enjoys: being outdoors       Diet: eats all food groups    Caffeine: drink coffee in the morning, some tea, diet dr pepper   Water: 3-4 cups daily         Wears seat belt    Does not use phone while driving   [place] put up not loaded          Social Determinants of Radio broadcast assistant Strain: Low Risk    Difficulty of Paying Living Expenses: Not hard at all  Food Insecurity: No Food Insecurity   Worried About Charity fundraiser in the Last Year: Never true   Arboriculturist in the Last Year: Never true  Transportation Needs: No Transportation Needs   Lack of Transportation (Medical): No   Lack of Transportation (Non-Medical): No  Physical Activity: Sufficiently Active   Days of Exercise per Week: 7 days   Minutes of Exercise per Session: 50 min  Stress: No Stress Concern Present   Feeling of Stress : Not at all  Social Connections: Moderately Integrated   Frequency of Communication with Friends and Family: More than three times a week   Frequency of Social Gatherings with Friends and Family: More than three times a week    Attends Religious Services: More than 4 times per year   Active Member of [employer] or Organizations: No   Attends Archivist Meetings: Never   Marital Status: Married  Human resources officer Violence: Not At Risk   Fear of Current or Ex-Partner: No   Emotionally Abused: No   Physically Abused: No   Sexually Abused: No    Outpatient Medications Prior to Visit  Medication Sig Dispense Refill   acetaminophen (TYLENOL)  500 MG tablet Take 1,000 mg by mouth every 6 (six) hours as needed for moderate pain or headache.     amiodarone (PACERONE) 200 MG tablet Take 1 tablet (200 mg total) by mouth daily. 60 tablet 6   amLODipine (NORVASC) 10 MG tablet Take 1 tablet (10 mg total) by mouth daily. 90 tablet 3   apixaban (ELIQUIS) 5 MG TABS tablet TAKE ONE TABLET BY MOUTH 2 TIMES A DAY. 60 tablet 5   blood glucose meter kit and supplies KIT Inject 1 each into the skin 4 (four) times daily -  before meals and at bedtime. Dispense based on patient and insurance preference. Use up to four times daily as directed. 1 each 0   cetirizine (ZYRTEC) 10 MG tablet Take 1 tablet (10 mg total) by mouth daily as needed for allergies. 90 tablet 2   Continuous Blood Gluc Receiver (FREESTYLE LIBRE 2 READER) DEVI Use to monitor blood glucose continuously     Continuous Blood Gluc Sensor (FREESTYLE LIBRE 2 SENSOR) MISC Apply 1 each topically every 14 (fourteen) days.     furosemide (LASIX) 20 MG tablet Take 1 tablet (20 mg total) by mouth every other day. (MAY TAKE DAILY AS NEEDED FOR INCREASED SWELLING)     hydrALAZINE (APRESOLINE) 100 MG tablet Take 1 tablet (100 mg total) by mouth 2 (two) times daily. 180 tablet 3   insulin lispro (HUMALOG KWIKPEN) 100 UNIT/ML KwikPen Inject 1-10 Units into the skin 4 (four) times daily -  before meals and at bedtime. Blood sugars 70-120: 1 unit 121-150: 2 units 151-200: 3 units 201-250: 4 units 251-300: 5 units 301-350: 6 units 351-400: 9 units More than 400, 10 units and call your  doctor 15 mL 11   Insulin Pen Needle 29G X 12MM MISC 1 each by Does not apply route 4 (four) times daily -  before meals and at bedtime. 100 each 0   isosorbide mononitrate (IMDUR) 30 MG 24 hr tablet TAKE 1/2 TABLET BY MOUTH ONCE DAILY. 90 tablet 3   Lancets (ONETOUCH DELICA PLUS [id number]) MISC 1 each by Other route 4 (four) times daily.     lenalidomide (REVLIMID) 10 MG capsule Take 10 mg by mouth. Take 10 mg on days 1-21 of a 28 day cycle.     metoprolol succinate (TOPROL XL) 25 MG 24 hr tablet Take 0.5 tablets (12.5 mg total) by mouth daily. 45 tablet 3   omeprazole (PRILOSEC) 20 MG capsule Take 1 capsule (20 mg total) by mouth daily as needed (reflux). 90 capsule 1   ONETOUCH ULTRA test strip 1 each by Other route 4 (four) times daily. 100 each 0   Potassium Chloride Crys ER (KLOR-CON M20 PO) Take 1 tablet by mouth daily.     VENTOLIN HFA 108 (90 Base) MCG/ACT inhaler Inhale 2 puffs into the lungs every 6 (six) hours as needed for wheezing or shortness of breath. 18 g 3   No facility-administered medications prior to visit.    Allergies  Allergen Reactions   Flomax [Tamsulosin Hcl] Nausea Only    Dizziness  Pt is on flomax   Heparin Other (See Comments)    Tingling in face and shortness of breath    ROS Review of Systems  Constitutional:  Negative for chills and fever.  HENT:  Negative for congestion and sore throat.   Eyes:  Negative for pain and discharge.  Respiratory:  Negative for cough and shortness of breath.   Cardiovascular:  Negative for chest pain  and palpitations.  Gastrointestinal:  Negative for diarrhea, nausea and vomiting.  Endocrine: Negative for polydipsia and polyuria.  Genitourinary:  Negative for dysuria and hematuria.  Musculoskeletal:  Negative for neck pain and neck stiffness.  Skin:  Negative for rash.  Neurological:  Negative for dizziness, weakness, numbness and headaches.  Psychiatric/Behavioral:  Negative for agitation and behavioral problems.       Objective:    Physical Exam Constitutional:      Appearance: He is obese.  HENT:     Head: Normocephalic and atraumatic.     Nose: No congestion.     Mouth/Throat:     Pharynx: No oropharyngeal exudate.  Cardiovascular:     Rate and Rhythm: Normal rate and regular rhythm.     Heart sounds: Normal heart sounds. No murmur heard. Pulmonary:     Breath sounds: Normal breath sounds. No wheezing or rales.  Musculoskeletal:     Cervical back: Neck supple. No tenderness.  Skin:    General: Skin is dry.     Findings: No rash.  Neurological:     General: No focal deficit present.     Mental Status: He is alert and oriented to person, place, and time.  Psychiatric:        Mood and Affect: Mood normal.        Behavior: Behavior normal.    BP 124/62 (BP Location: Left Arm, Patient Position: Sitting, Cuff Size: Normal)    Pulse (!) 53    Resp 18    Ht 6' (1.829 m)    Wt 182 lb 1.3 oz (82.6 kg)    SpO2 95%    BMI 24.69 kg/m  Wt Readings from Last 3 Encounters:  09/16/21 182 lb 1.3 oz (82.6 kg)  07/09/21 181 lb 9.6 oz (82.4 kg)  07/02/21 177 lb 0.6 oz (80.3 kg)    Lab Results  Component Value Date   TSH 1.441 06/18/2021   Lab Results  Component Value Date   WBC 7.8 08/10/2021   HGB 12.4 (L) 08/10/2021   HCT 38.3 08/10/2021   MCV 93 08/10/2021   PLT 261 08/10/2021   Lab Results  Component Value Date   NA 144 08/10/2021   K 4.7 08/10/2021   CO2 21 08/10/2021   GLUCOSE 98 08/10/2021   BUN 13 08/10/2021   CREATININE 1.65 (H) 08/10/2021   BILITOT 0.4 08/10/2021   ALKPHOS 67 08/10/2021   AST 11 08/10/2021   ALT 17 08/10/2021   PROT 5.9 (L) 08/10/2021   ALBUMIN 3.8 08/10/2021   CALCIUM 9.1 08/10/2021   ANIONGAP 9 06/10/2021   EGFR 43 (L) 08/10/2021   Lab Results  Component Value Date   CHOL 177 08/10/2021   Lab Results  Component Value Date   HDL 50 08/10/2021   Lab Results  Component Value Date   LDLCALC 107 (H) 08/10/2021   Lab Results  Component Value  Date   TRIG 108 08/10/2021   Lab Results  Component Value Date   CHOLHDL 2.9 07/31/2020   Lab Results  Component Value Date   HGBA1C 6.5 (H) 06/01/2021      Assessment & Plan:   Problem List Items Addressed This Visit       Cardiovascular and Mediastinum   Essential hypertension    BP Readings from Last 1 Encounters:  09/16/21 124/62  Well-controlled Counseled for compliance with the medications Advised DASH diet and moderate exercise/walking as tolerated       Atrial fibrillation ([place])  On Amiodarone, Metoprolol and Eliquis Followed by Cardiology      Chronic systolic HF (heart failure) (H[facility])    On Metoprolol, Imdur and Hydralazine On Lasix Followed by Cardiology Appears euvolemic currently        Genitourinary   Stage 3b chronic kidney disease ([place]) - Primary    Recent decrease in GFR could be due to multiple new oncology medications Advised to maintain adequate hydration Avoid other nephrotoxic agents like NSAIDs        Other   Follicular lymphoma ([place])    Had recent CNS lymphoma, followed by Heme/Onc. At [facility] - on lenalidomide and Rituximab, responding well Was given Dexamethasone, which decreased size of CNS lesions. Last Oncology visit note reviewed.       No orders of the defined types were placed in this encounter.   Follow-up: Return in about 6 months (around 03/17/2022) for Annual physical.    [name], MD

## 2021-09-16 NOTE — Patient Instructions (Signed)
Darius Smith,  It was great to talk to you today!  Please call me with any questions or concerns.   Visit Information  Following are the goals we discussed today:  Patient Goals/Self-Care Activities Over the next 90 days, patient will:  Take medications as prescribed Check blood sugar continuously with continuous glucose monitor, document, and provide at future appointments Check blood pressure at least once daily, document, and provide at future appointments Weigh daily, and contact provider if weight gain of more than 3 lbs in 1 day or more than 5 lbs in 1 week  Plan: Telephone follow up appointment with care management team member scheduled for:  12/10/21  Darius Smith, PharmD, BCACP, CPP Clinical Pharmacist Practitioner [facility] [phone]  Please call the care guide team at [phone] if you need to cancel or reschedule your appointment.   Patient verbalizes understanding of instructions provided today and agrees to view in [place].

## 2021-09-16 NOTE — Patient Instructions (Signed)
Please ask your Oncologist about pneumococcal vaccine and Shingrix vaccine.  Please continue to take other medications as prescribed.

## 2021-09-16 NOTE — Assessment & Plan Note (Signed)
On Metoprolol, Imdur and Hydralazine On Lasix Followed by Cardiology Appears euvolemic currently

## 2021-09-16 NOTE — Chronic Care Management (AMB) (Signed)
Chronic Care Management Pharmacy Note  09/16/2021 Name:  Darius Smith MRN:  [id number] DOB:  [birth year]  Summary: Steroid Induced Hyperglycemia Uses Freestyle libre to monitor blood glucose. Upon review of Freestyle Libre Reader device today in clinic, blood glucose in target range (70-180) nearly 100% of the time over last 90 days. No hypoglycemia. Very few high blood glucose. Average blood glucose over last 14 days 100, last 30 days 101, last 90 days 108. Continue sliding scale insulin if blood glucose >180. Continue to use Freestyle Libre to monitor blood glucose a few times per day. Check finger stick blood glucose if symptoms do not match sensory glucose reading.   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 Currently on Revlimid. Patient currently on oral chemo with plans to restart infusion/steroid on September 30, 2021. Patient reports plan would then be for infusion every 8 weeks for 2 years.  Patient reports that recent MRI revealed undetectable tumors in brain, sinus, and right hip. Remaining detectable tumors in lungs, spine, pelvis, and left hip. Continue current management and follow-up with oncology provider  Subjective: Darius Smith is an 75 y.o. year old male who is a primary patient of [name], MD.  The CCM team was consulted for assistance with disease management and care coordination needs.    Engaged with patient who presented with wife [name] face to face for follow up visit in response to provider referral for pharmacy case management and/or care coordination services.   Consent to Services:  The patient was given information about Chronic Care Management services, agreed to services, and gave verbal consent prior to initiation of services.  Please see initial visit note for detailed documentation.   Patient Care Team: [name], MD as PCP - General (Internal Medicine) Harl Bowie Alphonse Guild, MD as  PCP - Cardiology (Cardiology) Brien Mates, RN as Oncology Nurse Navigator (Oncology) Kassie Mends, RN as [place] [facility] Management Beryle Lathe, [facility] (Pharmacist)  Objective:  Lab Results  Component Value Date   CREATININE 1.65 (H) 08/10/2021   CREATININE 1.43 (H) 06/10/2021   CREATININE 1.50 (H) 06/05/2021    Lab Results  Component Value Date   HGBA1C 6.5 (H) 06/01/2021   Last diabetic Eye exam: No results found for: HMDIABEYEEXA  Last diabetic Foot exam: No results found for: HMDIABFOOTEX      Component Value Date/Time   CHOL 177 08/10/2021 0833   TRIG 108 08/10/2021 0833   HDL 50 08/10/2021 0833   CHOLHDL 2.9 07/31/2020 1131   [place] 107 (H) 08/10/2021 0833    Hepatic Function Latest Ref Rng & Units 08/10/2021 06/10/2021 06/02/2021  Total Protein 6.0 - 8.5 g/dL 5.9(L) 6.1(L) 5.5(L)  Albumin 3.7 - 4.7 g/dL 3.8 2.9(L) 2.7(L)  AST 0 - 40 IU/L _0 ALT 0 - 44 IU/L 17 36 28  Alk Phosphatase 44 - 121 IU/L 67 50 40  Total Bilirubin 0.0 - 1.2 mg/dL 0.4 0.7 1.2  Bilirubin, Direct 0.0 - 0.3 mg/dL - - -    Lab Results  Component Value Date/Time   TSH 1.441 06/18/2021 09:27 AM   TSH 1.156 08/01/2020 04:08 AM    CBC Latest Ref Rng & Units 08/10/2021 06/10/2021 06/05/2021  WBC 3.4 - 10.8 x10E3/uL 7.8 14.4(H) 14.5(H)  Hemoglobin 13.0 - 17.7 g/dL 12.4(L) 12.9(L) 12.1(L)  Hematocrit 37.5 - 51.0 % 38.3 37.4(L) 34.8(L)  Platelets 150 - 450 x10E3/uL  261 167 128(L)    No results found for: VD25OH  Clinical ASCVD: No  The 10-year ASCVD risk score (Arnett DK, et al., 2019) is: 44.2%   Values used to calculate the score:     Age: 70 years     Sex: Male     Is Non-Hispanic African American: No     Diabetic: Yes     Tobacco smoker: No     Systolic Blood Pressure: 233 mmHg     Is BP treated: Yes     HDL Cholesterol: 50 mg/dL     Total Cholesterol: 177 mg/dL    Social History   Tobacco Use  Smoking Status Never  Smokeless Tobacco Never    BP Readings from Last 3 Encounters:  09/16/21 124/62  07/09/21 (!) 116/58  07/02/21 108/72   Pulse Readings from Last 3 Encounters:  09/16/21 (!) 53  07/09/21 86  07/02/21 94   Wt Readings from Last 3 Encounters:  09/16/21 182 lb 1.3 oz (82.6 kg)  07/09/21 181 lb 9.6 oz (82.4 kg)  07/02/21 177 lb 0.6 oz (80.3 kg)    Assessment: Review of patient past medical history, allergies, medications, health status, including review of consultants reports, laboratory and other test data, was performed as part of comprehensive evaluation and provision of chronic care management services.   SDOH:  (Social Determinants of Health) assessments and interventions performed:    CCM Care Plan  Allergies  Allergen Reactions   Flomax [Tamsulosin Hcl] Nausea Only    Dizziness  Pt is on flomax   Heparin Other (See Comments)    Tingling in face and shortness of breath    Medications Reviewed Today     Reviewed by Beryle Lathe, [facility] (Pharmacist) on 09/16/21 at 1235  Med List Status: <None>   Medication Order Taking? Sig Documenting Provider Last Dose Status Informant  acetaminophen (TYLENOL) 500 MG tablet [id number] Yes Take 1,000 mg by mouth every 6 (six) hours as needed for moderate pain or headache. [provider] Taking Active Spouse/Significant Other  amiodarone (PACERONE) 200 MG tablet [id number] Yes Take 1 tablet (200 mg total) by mouth daily. Fenton, Clint R, PA Taking Active Spouse/Significant Other  amLODipine (NORVASC) 10 MG tablet [id number] Yes Take 1 tablet (10 mg total) by mouth daily. Arnoldo Lenis, MD Taking Active Spouse/Significant Other  apixaban (ELIQUIS) 5 MG TABS tablet [id number] Yes TAKE ONE TABLET BY MOUTH 2 TIMES A DAY. Arnoldo Lenis, MD Taking Active   blood glucose meter kit and supplies KIT [id number]  Inject 1 each into the skin 4 (four) times daily -  before meals and at bedtime. Dispense based on patient and insurance preference. Use up to  four times daily as directed. Barb Merino, MD  Active   cetirizine (ZYRTEC) 10 MG tablet [id number] Yes Take 1 tablet (10 mg total) by mouth daily as needed for allergies. Corum, Rex Kras, MD Taking Active   Continuous Blood Gluc Receiver (FREESTYLE LIBRE 2 READER) DEVI [id number] Yes Use to monitor blood glucose continuously [provider] Taking Active   Continuous Blood Gluc Sensor (FREESTYLE LIBRE 2 SENSOR) MISC [id number] Yes Apply 1 each topically every 14 (fourteen) days. [provider] Taking Active   furosemide (LASIX) 20 MG tablet [id number] Yes Take 1 tablet (20 mg total) by mouth every other day. (MAY TAKE DAILY AS NEEDED FOR INCREASED SWELLING) Branch, Alphonse Guild, MD Taking Active Spouse/Significant Other  hydrALAZINE (APRESOLINE) 100 MG tablet [id number] Yes Take 1 tablet (  100 mg total) by mouth 2 (two) times daily. Arnoldo Lenis, MD Taking Active   insulin lispro (HUMALOG KWIKPEN) 100 UNIT/ML KwikPen [id number] Yes Inject 1-10 Units into the skin 4 (four) times daily -  before meals and at bedtime. Blood sugars 70-120: 1 unit 121-150: 2 units 151-200: 3 units 201-250: 4 units 251-300: 5 units 301-350: 6 units 351-400: 9 units More than 400, 10 units and call your doctor Barb Merino, MD Taking Active            Med Note Jim Like Jul 16, 2021  1:45 PM) Patient only using if blood glucose >180 per instructions from Dr. Moshe Cipro  Insulin Pen Needle 29G X 12MM MISC [id number]  1 each by Does not apply route 4 (four) times daily -  before meals and at bedtime. Fayrene Helper, MD  Active   isosorbide mononitrate (IMDUR) 30 MG 24 hr tablet [id number] Yes TAKE 1/2 TABLET BY MOUTH ONCE DAILY. Arnoldo Lenis, MD Taking Active Spouse/Significant Other  Lancets (ONETOUCH DELICA PLUS FOYDXA12I) [place] [id number]  1 each by Other route 4 (four) times daily. [provider]  Active   lenalidomide (REVLIMID) 10 MG capsule [id number] Yes Take 10 mg by  mouth. Take 10 mg on days 1-21 of a 28 day cycle. [provider] Taking Active   metoprolol succinate (TOPROL XL) 25 MG 24 hr tablet [id number] Yes Take 0.5 tablets (12.5 mg total) by mouth daily. Arnoldo Lenis, MD Taking Active   omeprazole (PRILOSEC) 20 MG capsule [id number] Yes Take 1 capsule (20 mg total) by mouth daily as needed (reflux). [name], MD Taking Active   [facility] ULTRA test strip [id number]  1 each by Other route 4 (four) times daily. Fayrene Helper, MD  Active   Potassium Chloride Crys ER (KLOR-CON M20 PO) [id number] Yes Take 1 tablet by mouth daily. [provider] Taking Active Spouse/Significant Other  VENTOLIN HFA 108 (90 Base) MCG/ACT inhaler [id number] Yes Inhale 2 puffs into the lungs every 6 (six) hours as needed for wheezing or shortness of breath. Fayrene Helper, MD Taking Active Spouse/Significant Other            Patient Active Problem List   Diagnosis Date Noted   CHF (congestive heart failure) ([place]) 06/24/2021   Brain metastases ([place])    Hyperkalemia 06/01/2021   Secondary hypercoagulable state ([place]) 08/27/2020   Palliative care by specialist    DNR (do not resuscitate) discussion    Acute on chronic renal failure ([place]) 08/03/2020   Encounter for support and coordination of transition of care 07/31/2020   Stage 3b chronic kidney disease ([place]) 99/35/7017   Chronic systolic HF (heart failure) ([place]) 79/39/0300   Follicular lymphoma ([place]) 06/11/2020   Atrial fibrillation ([place]) 03/20/2020   Essential hypertension 08/08/2014   GERD (gastroesophageal reflux disease) 08/08/2014    Immunization History  Administered Date(s) Administered   Fluad Quad(high Dose 65+) 06/04/2021   Influenza Nasal 06/23/2020   Influenza, High Dose Seasonal PF 05/30/2019   Influenza-Unspecified 06/21/2017, 07/03/2018, 05/30/2019, 06/23/2020   PFIZER(Purple Top)SARS-COV-2 Vaccination 11/11/2019, 12/05/2019, 07/03/2020, 07/03/2020    Pneumococcal Polysaccharide-23 06/04/2019   Tdap 04/18/2014   Zoster, Live 06/26/2013    Conditions to be addressed/monitored: Atrial Fibrillation, CHF, HTN, CKD Stage 3b, steroid induced hyperglycemia, and cancer  Care Plan : Medication Management  Updates made by Beryle Lathe, [place] since 09/16/2021 12:00 AM     Problem: Hyperglycemia, Hypertension, Afib, HFimpEF, CKD,  Cancer   Priority: High  Onset Date: 06/17/2021     Long-Range Goal: Disease Progression Prevention   Start Date: 06/17/2021  Expected End Date: 09/15/2021  Recent Progress: On track  Priority: High  Note:   Current Barriers:  Unable to independently monitor therapeutic efficacy  Pharmacist Clinical Goal(s):  Through collaboration with PharmD and provider, patient will: Achieve adherence to monitoring guidelines and medication adherence to achieve therapeutic efficacy   Interventions: 1:1 collaboration with [name], MD regarding development and update of comprehensive plan of care as evidenced by provider attestation and co-signature Inter-disciplinary care team collaboration (see longitudinal plan of care) Comprehensive medication review performed; medication list updated in electronic medical record  Steroid Induced Hyperglycemia  - Goal on Track (progressing): YES.: Patient is intermittently taking dexamethasone weekly with rituximab as part of cancer treatment. Last infusion was 07/29/21. Patient currently on oral chemo with plans to restart infusion/steroid on September 30, 2021. Patient reports plan would then be for infusion every 8 weeks for 2 years.  Controlled; Most recent A1c at goal of <7% per ADA guidelines Current medications: insulin lispro (Humalog) sliding scale insulin 1-10 units four times daily (with meals and at bedtime) Patient reports only using insulin if blood glucose >180 per instructions from Dr. Moshe Cipro and this has worked well. Patient only needs to use insulin for 1-2  days after dexamethasone dose. Last dose of insulin needed was late November 2022. Denies hypoglycemic symptoms (sweaty and shaky). Hypoglycemia prevention: not indicated at this time Current meal patterns: not discussed today Current exercise: walks laps around house every morning and tries to do leg exercises 2-3 times per day to build strength up in his legs On a statin: no, not indicated at this time On aspirin 81 mg daily: no, on anticoagulation (Eliquis) Uses Freestyle libre to monitor blood glucose. Upon review of Freestyle Libre Reader device today in clinic, blood glucose in target range (70-180) nearly 100% of the time over last 90 days. No hypoglycemia. Very few high blood glucose. Average blood glucose over last 14 days 100, last 30 days 101, last 90 days 108. Continue sliding scale insulin if blood glucose >180. Continue to use Freestyle Libre to monitor blood glucose a few times per day. Check finger stick blood glucose if symptoms do not match sensory glucose reading.   Hypertension - Condition stable. Not addressed this visit.: Blood pressure under good control. Blood pressure is at goal of <130/80 mmHg per 2017 AHA/ACC guidelines. Current medications: amlodipine 10 mg by mouth once daily, metoprolol succinate 12.5 mg by mouth once daily, hydralazine 100 mg by mouth twice daily, and furosemide 20 mg by mouth every other day Potassium repletion as well Also takes isosorbide 15 mg by mouth daily for heart failure  Intolerances: none Taking medications as directed: yes Side effects thought to be attributed to current medication regimen: no Current home blood pressure: unknown Encourage dietary sodium restriction/DASH diet Recommend home blood pressure monitoring to discuss at next visit Continue current medications as above  Heart failure with improved ejection fraction (previous LVEF <40% and a follow-up LVEF >50%)  - Condition stable. Not addressed this visit. Appropriately  managed. Follows with Dr. Harl Bowie Current treatment: metoprolol succinate 12.5 mg by mouth once daily, hydralazine 100 mg by mouth twice daily, isosorbide mononitrate 15 by mouth daily, and furosemide 20 mg by mouth every other day Potassium repletion as well Stage C (Symptomatic heart failure)/NYHA Class II (Slight limitation of physical activity. Comfortable at rest. Ordinary  physical activity results in fatigue, palpitation, or dyspnea) Most recent echocardiogram: LVEF 50% (11/26/20) which was improved from LVEF 20-25% (06/12/20) Current home blood pressure: unknown Current home weight: stable in the 170-180s Encourage dietary sodium restriction (<3 g/day) Educated on the importance of weighing daily. Patient aware to contact cardiology/primary care team if weight gain >3 lbs in 1 day or >5 lbs in 1 week Follow-up with cardiology Continue current medications as above  Atrial Fibrillation  - Condition stable. Not addressed this visit. Controlled. Most recent ECG: sinus bradycardia. Follows with Dr. Harl Bowie. Current rate and rhythm control: metoprolol succinate 12.5 mg by mouth once daily and amiodarone 200 mg by mouth once daily Anticoagulation: apixaban (Eliquis) 5 mg by mouth twice daily  CHADS2VASc score: 5 - Age (2 points), Heart failure history (1 point), Hypertension history (1 point), and Diabetes history (1 point) Prior ablation: no History of cardioversion:  yes, multiple Denies signs and symptoms of bleeding Continue metoprolol succinate 12.5 mg by mouth once daily and amiodarone 200 mg by mouth once daily Continue apixaban (Eliquis) 5 mg by mouth twice daily  Recommend home blood pressure and heart rate monitoring to discuss at next visit Discussed need for medication compliance  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 Not addressed this visit. Appropriately managed Current medications: furosemide 20 mg by mouth daily  (may take additional 20 mg if extra fluid overload)  + potassium repletion  Intolerances: none Taking medications as directed: yes Side effects thought to be attributed to current medication regimen: no Most recent GFR: 43 mL/min Most recent microalbumin: n/a Recommend adequate hydration   Cancer (Managed by [facility])  - Condition stable. 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 Currently on Revlimid. Patient currently on oral chemo with plans to restart infusion/steroid on September 30, 2021. Patient reports plan would then be for infusion every 8 weeks for 2 years.  Patient reports that recent MRI revealed undetectable tumors in brain, sinus, and right hip. Remaining detectable tumors in lungs, spine, pelvis, and left hip. Rituximab regimen contains dexamethasone which is inducing hyperglycemia Patient reports that he is not in pain and is not having nausea or vomiting Still has a good appetite Continue current management and follow-up with oncology provider  Patient Goals/Self-Care Activities Over the next 90 days, patient will:  Take medications as prescribed Check blood sugar continuously with continuous glucose monitor, document, and provide at future appointments Check blood pressure at least once daily, document, and provide at future appointments Weigh daily, and contact provider if weight gain of more than 3 lbs in 1 day or more than 5 lbs in 1 week  Follow Up Plan: Telephone follow up appointment with care management team member scheduled for: 12/10/21      Medication Assistance: None required.  Patient affirms current coverage meets needs.  Patient's preferred pharmacy is:  [facility], [place] [address] Phone: [phone] Fax: [phone]  Follow Up:  Patient agrees to Care Plan and Follow-up.  Plan: Telephone follow up appointment with care  management team member scheduled for:  12/10/21  [name], PharmD, [place], CPP Clinical Pharmacist Practitioner [facility] [phone]

## 2021-09-16 NOTE — Assessment & Plan Note (Signed)
BP Readings from Last 1 Encounters:  09/16/21 124/62   Well-controlled Counseled for compliance with the medications Advised DASH diet and moderate exercise/walking as tolerated

## 2021-09-16 NOTE — Assessment & Plan Note (Signed)
Recent decrease in GFR could be due to multiple new oncology medications Advised to maintain adequate hydration Avoid other nephrotoxic agents like NSAIDs

## 2021-09-19 ENCOUNTER — Encounter: Payer: Self-pay | Admitting: Internal Medicine

## 2021-09-19 DIAGNOSIS — T380X5D Adverse effect of glucocorticoids and synthetic analogues, subsequent encounter: Secondary | ICD-10-CM | POA: Diagnosis not present

## 2021-09-19 DIAGNOSIS — I110 Hypertensive heart disease with heart failure: Secondary | ICD-10-CM

## 2021-09-19 DIAGNOSIS — I5022 Chronic systolic (congestive) heart failure: Secondary | ICD-10-CM | POA: Diagnosis not present

## 2021-09-19 DIAGNOSIS — E0965 Drug or chemical induced diabetes mellitus with hyperglycemia: Secondary | ICD-10-CM | POA: Diagnosis not present

## 2021-09-19 DIAGNOSIS — I4891 Unspecified atrial fibrillation: Secondary | ICD-10-CM

## 2021-09-19 DIAGNOSIS — C8290 Follicular lymphoma, unspecified, unspecified site: Secondary | ICD-10-CM

## 2021-09-22 ENCOUNTER — Other Ambulatory Visit: Payer: Self-pay

## 2021-09-22 ENCOUNTER — Other Ambulatory Visit: Payer: Medicare Other

## 2021-09-22 DIAGNOSIS — Z515 Encounter for palliative care: Secondary | ICD-10-CM

## 2021-09-22 NOTE — Progress Notes (Signed)
PATIENT NAME: Darius Smith DOB: 09-17-1946 MRN: [id number]  PRIMARY CARE PROVIDER: [name], MD  RESPONSIBLE PARTY:  Acct ID - Guarantor Home Phone Work Phone Relationship Acct Type  [id number] - Mccowen,Secundino* [phone]  Self P/F     [address], [place], [place] [address]    Due to the COVID-19 crisis, this visit was done via telemedicine from my office and it was initiated and consent by this patient and or family.  I connected with  Darius Smith OR PROXY on 09/22/21 telephonically and verified that I am speaking with the correct person using two identifiers.   I discussed the limitations of evaluation and management by telemedicine. The patient expressed understanding and agreed to proceed.  PLAN OF CARE and INTERVENTIONS:               1.  GOALS OF CARE/ ADVANCE CARE PLANNING:  DNR in place.  Patient desires to continue with chemo treatments and is working to strengthen lower extremities.                2.  PATIENT/CAREGIVER EDUCATION:  CHF.               4. PERSONAL EMERGENCY PLAN:  Activate 911 for emergencies.                5.  DISEASE STATUS:  Connect telephonically with wife Darius Smith who provided the update on patient's condition.  Wife feels overall patient is doing very well and has not new concerns.   Strengthening:  Patient is utilizing a rolling walker to ambulate in/out of the home.  The wheelchair is being used for long distance.  Wife stated patient received a pedal machine that he is using routinely to strengthen lower extremities.   Patient is hopeful to re-gain lower body strength.   No falls reported.   Edema:  Weight is 175 lbs.  Patient is weighed daily and fluctuates 1-2 lbs.  No issues with shortness of breath or edema reported.   Encouraged wife to continue to monitor weights closely and monitor for edema or shortness of breath.   Treatment:  Patient is scheduled to restart chemo next week.  To date, wife denies any side effects from treatments.     Appetite:  Wife endorsed a "very good appetite".    Pain:  Occasional headaches are reported but resolve with the use of tylenol.   Next home visit scheduled with [name], NP for 3/2 @ 2 pm.   HISTORY OF PRESENT ILLNESS:  76 year old male with history of CHF and non-Hodgkin's Lymphoma.  Patient is being followed by Palliative Care every 4-8 weeks and PRN.   CODE STATUS: DNR ADVANCED DIRECTIVES: Yes MOST FORM: No PPS: 50%        Lorenza Burton, RN

## 2021-09-25 ENCOUNTER — Encounter: Payer: Self-pay | Admitting: Internal Medicine

## 2021-09-25 ENCOUNTER — Other Ambulatory Visit: Payer: Self-pay

## 2021-09-25 ENCOUNTER — Ambulatory Visit: Payer: Medicare Other | Admitting: Internal Medicine

## 2021-09-25 ENCOUNTER — Other Ambulatory Visit: Payer: Self-pay | Admitting: Internal Medicine

## 2021-09-25 ENCOUNTER — Ambulatory Visit (HOSPITAL_COMMUNITY)
Admission: RE | Admit: 2021-09-25 | Discharge: 2021-09-25 | Disposition: A | Discharge status: Home / Self Care | Payer: Medicare Other | Source: Ambulatory Visit | Attending: Internal Medicine | Admitting: Internal Medicine

## 2021-09-25 DIAGNOSIS — J209 Acute bronchitis, unspecified: Secondary | ICD-10-CM

## 2021-09-25 DIAGNOSIS — R059 Cough, unspecified: Secondary | ICD-10-CM | POA: Diagnosis not present

## 2021-09-25 DIAGNOSIS — J9811 Atelectasis: Secondary | ICD-10-CM | POA: Diagnosis not present

## 2021-09-25 MED ORDER — AMOXICILLIN-POT CLAVULANATE 875-125 MG PO TABS: 1.0000 | ORAL_TABLET | Freq: Two times a day (BID) | ORAL | 0 refills | Status: DC

## 2021-09-25 MED ORDER — BENZONATATE 100 MG PO CAPS: 100.0000 mg | ORAL_CAPSULE | Freq: Two times a day (BID) | ORAL | 0 refills | Status: DC | PRN

## 2021-09-25 NOTE — Progress Notes (Signed)
Virtual Visit via Telephone Note   This visit type was conducted due to national recommendations for restrictions regarding the COVID-19 Pandemic (e.g. social distancing) in an effort to limit this patient's exposure and mitigate transmission in our community.  Due to his co-morbid illnesses, this patient is at least at moderate risk for complications without adequate follow up.  This format is felt to be most appropriate for this patient at this time.  The patient did not have access to video technology/had technical difficulties with video requiring transitioning to audio format only (telephone).  All issues noted in this document were discussed and addressed.  No physical exam could be performed with this format.  Evaluation Performed:  Follow-up visit  Date:  09/25/2021   ID:  Darius Smith 1946-02-10, MRN [id number]  Patient Location: Home Provider Location: Office/Clinic  Participants: Patient Location of Patient: Home Location of Provider: Telehealth Consent was obtain for visit to be over via telehealth. I verified that I am speaking with the correct person using two identifiers.  PCP:  [name], MD   Chief Complaint: Cough  History of Present Illness:    Darius Smith is a 76 y.o. male who has a televisit for complaint of cough and dyspnea since last night.  He had a negative COVID test at home.  Of note, he is undergoing chemotherapy currently.  He currently denies any fever, chills or wheezing.  He is weight has been stable.  His wife states that he does not have any leg swelling currently.  He takes Lasix for history of CHF.  The patient does not have symptoms concerning for COVID-19 infection (fever, chills, cough, or new shortness of breath).   Past Medical, Surgical, Social History, Allergies, and Medications have been Reviewed.  Past Medical History:  Diagnosis Date   Allergic rhinitis 07/02/2019   Arthritis    both hands   Atrial fibrillation ([place])  10/28/2017   Dr. Harl Bowie   Atrial fibrillation with RVR ([place]) 07/14/2020   Blood in urine    occ   Bronchitis    Cancer ([place])    Basal cell   Cardiomegaly 10/28/2017   CHF (congestive heart failure) ([place])    Complication of anesthesia    Mr. Overbaugh developed a. fib in recovery after cysto procedure 10/2017 was transferred to [facility] and underwent successful cardioversion   Diabetes [facility])    Diverticulosis of sigmoid colon 08/2017   Noted on colonoscopy   Dysrhythmia    External hemorrhoids 08/2017   GERD (gastroesophageal reflux disease)    History of colon polyps 08/2017   History of kidney stones    History of rectal bleeding    History of right inguinal hernia    Hypertension    Lung cancer ([place]) 06/25/2020   Lymphoma ([place])    Nephrolithiasis 07/02/2019   Rectal bleeding 09/02/2017   Added automatically from request for surgery [id number]   Shortness of breath    Stage 3 chronic kidney disease [facility])    Past Surgical History:  Procedure Laterality Date   AXILLARY LYMPH NODE BIOPSY Left 06/05/2021   Procedure: AXILLARY LYMPH NODE BIOPSY;  Surgeon: Virl Cagey, MD;  Location: [facility] ORS;  Service: General;  Laterality: Left;   CARDIOVERSION     10/2017   CARDIOVERSION N/A 07/16/2020   Procedure: CARDIOVERSION;  Surgeon: Arnoldo Lenis, MD;  Location: [facility] ENDO SUITE;  Service: Endoscopy;  Laterality: N/A;   CARDIOVERSION N/A 08/21/2020   Procedure:  CARDIOVERSION;  Surgeon: Satira Sark, MD;  Location: [facility] ORS;  Service: Cardiovascular;  Laterality: N/A;   CARDIOVERSION N/A 09/10/2020   Procedure: CARDIOVERSION;  Surgeon: Josue Hector, MD;  Location: [facility] ENDOSCOPY;  Service: Cardiovascular;  Laterality: N/A;   CHOLECYSTECTOMY     CLEFT LIP REPAIR     several from childhood til 76 years old   [place]     several from childhood til 75 years old   COLONOSCOPY  04/28/2011   Procedure: COLONOSCOPY;  Surgeon: Rogene Houston, MD;  Location: [facility] END[facility] SU[facility];   Service: Endoscopy;  Laterality: N/A;   COLONOSCOPY N/A 07/18/2014   Procedure: COLONOSCOPY;  Surgeon: Rogene Houston, MD;  Location: [facility] END[facility];  Service: Endoscopy;  Laterality: N/A;  830   COLONOSCOPY N/A 09/09/2017   Procedure: COLONOSCOPY;  Surgeon: Rogene Houston, MD;  Location: [facility] END[facility];  Service: Endoscopy;  Laterality: N/A;  955   CYSTOSCOPY/URETEROSCOPY/HOLMIUM LASER/STENT PLACEMENT Bilateral 12/06/2017   Procedure: CYSTOSCOPY/RETROGRADE/URETEROSCOPY/HOLMIUM LASER/STENT EXCHANGE;  Surgeon: Festus Aloe, MD;  Location: [facility] ORS;  Service: Urology;  Laterality: Bilateral;  ONLY NEEDS 60 MIN   HERNIA REPAIR     LYMPH NODE BIOPSY Left    Under left arm   POLYPECTOMY  09/09/2017   Procedure: POLYPECTOMY;  Surgeon: Rogene Houston, MD;  Location: [facility] END[facility] SU[facility];  Service: Endoscopy;;  colon   vocal cord surgery       No outpatient medications have been marked as taking for the 09/25/21 encounter (Office Visit) with [name], MD.     Allergies:   Flomax [tamsulosin hcl] and Heparin   ROS:   Please see the history of present illness.     All other systems reviewed and are negative.   Labs/Other Tests and Data Reviewed:    Recent Labs: 12/19/2020: Magnesium 2.0 06/03/2021: B Natriuretic Peptide 68.0 06/18/2021: TSH 1.441 08/10/2021: ALT 17; BUN 13; Creatinine, Ser 1.65; Hemoglobin 12.4; Platelets 261; Potassium 4.7; Sodium 144   Recent Lipid Panel Lab Results  Component Value Date/Time   CHOL 177 08/10/2021 08:33 AM   TRIG 108 08/10/2021 08:33 AM   HDL 50 08/10/2021 08:33 AM   CHOLHDL 2.9 07/31/2020 11:31 AM   LDLCALC 107 (H) 08/10/2021 08:33 AM    Wt Readings from Last 3 Encounters:  09/16/21 182 lb 1.3 oz (82.6 kg)  07/09/21 181 lb 9.6 oz (82.4 kg)  07/02/21 177 lb 0.6 oz (80.3 kg)     ASSESSMENT & PLAN:    Acute bronchitis Cough and dyspnea likely due to bronchitis Stable weight, no leg swelling and good urine output go against CHF  exacerbation Started Augmentin as he is immunocompromised currently Tessalon as needed for cough Check CXR  Time:   Today, I have spent 12 minutes reviewing the chart, including problem list, medications, and with the patient with telehealth technology discussing the above problems.   Medication Adjustments/Labs and Tests Ordered: Current medicines are reviewed at length with the patient today.  Concerns regarding medicines are outlined above.   Tests Ordered: No orders of the defined types were placed in this encounter.   Medication Changes: No orders of the defined types were placed in this encounter.    Note: This dictation was prepared with Dragon dictation along with smaller phrase technology. Similar sounding words can be transcribed inadequately or may not be corrected upon review. Any transcriptional errors that result from this process are unintentional.      Disposition:  Follow up  Signed, [name], MD  09/25/2021 10:54 AM     [place]

## 2021-09-25 NOTE — Progress Notes (Signed)
Erroneous encounter

## 2021-09-26 ENCOUNTER — Emergency Department (HOSPITAL_COMMUNITY)
Admission: EM | Admit: 2021-09-26 | Discharge: 2021-09-27 | Disposition: A | Discharge status: Home / Self Care | Payer: Medicare Other | Attending: Emergency Medicine | Admitting: Emergency Medicine

## 2021-09-26 ENCOUNTER — Encounter (HOSPITAL_COMMUNITY): Payer: Self-pay

## 2021-09-26 ENCOUNTER — Other Ambulatory Visit: Payer: Self-pay

## 2021-09-26 DIAGNOSIS — Z7901 Long term (current) use of anticoagulants: Secondary | ICD-10-CM | POA: Insufficient documentation

## 2021-09-26 DIAGNOSIS — Z79899 Other long term (current) drug therapy: Secondary | ICD-10-CM | POA: Insufficient documentation

## 2021-09-26 DIAGNOSIS — I509 Heart failure, unspecified: Secondary | ICD-10-CM | POA: Insufficient documentation

## 2021-09-26 DIAGNOSIS — I4891 Unspecified atrial fibrillation: Secondary | ICD-10-CM | POA: Diagnosis not present

## 2021-09-26 DIAGNOSIS — I130 Hypertensive heart and chronic kidney disease with heart failure and stage 1 through stage 4 chronic kidney disease, or unspecified chronic kidney disease: Secondary | ICD-10-CM | POA: Diagnosis not present

## 2021-09-26 DIAGNOSIS — U071 COVID-19: Secondary | ICD-10-CM | POA: Diagnosis not present

## 2021-09-26 DIAGNOSIS — N189 Chronic kidney disease, unspecified: Secondary | ICD-10-CM | POA: Diagnosis not present

## 2021-09-26 DIAGNOSIS — I517 Cardiomegaly: Secondary | ICD-10-CM | POA: Diagnosis not present

## 2021-09-26 DIAGNOSIS — R918 Other nonspecific abnormal finding of lung field: Secondary | ICD-10-CM | POA: Diagnosis not present

## 2021-09-26 DIAGNOSIS — R059 Cough, unspecified: Secondary | ICD-10-CM | POA: Diagnosis not present

## 2021-09-26 NOTE — ED Triage Notes (Signed)
Pt to ED from home with wife at bedside. States pt had a positive at home covid test tonight, and pt wife felt like with pt's underlying medical conditions (pt has cancer and undergoing chemo), that it was best he come in tonight to be seen. Pt has had a cough since Thursday and being treated with antibiotics. Diagnosed with bronchitis. Pt has no complaints at this time.

## 2021-09-27 ENCOUNTER — Emergency Department (HOSPITAL_COMMUNITY): Payer: Medicare Other

## 2021-09-27 DIAGNOSIS — R059 Cough, unspecified: Secondary | ICD-10-CM | POA: Diagnosis not present

## 2021-09-27 DIAGNOSIS — I517 Cardiomegaly: Secondary | ICD-10-CM | POA: Diagnosis not present

## 2021-09-27 DIAGNOSIS — R918 Other nonspecific abnormal finding of lung field: Secondary | ICD-10-CM | POA: Diagnosis not present

## 2021-09-27 LAB — CBC WITH DIFFERENTIAL/PLATELET
Abs Immature Granulocytes: 0.02 10*3/uL (ref 0.00–0.07)
Basophils Absolute: 0.1 10*3/uL (ref 0.0–0.1)
Basophils Relative: 1 %
Eosinophils Absolute: 0.1 10*3/uL (ref 0.0–0.5)
Eosinophils Relative: 1 %
HCT: 37 % — ABNORMAL LOW (ref 39.0–52.0)
Hemoglobin: 11.7 g/dL — ABNORMAL LOW (ref 13.0–17.0)
Immature Granulocytes: 0 %
Lymphocytes Relative: 30 %
Lymphs Abs: 1.5 10*3/uL (ref 0.7–4.0)
MCH: 28.5 pg (ref 26.0–34.0)
MCHC: 31.6 g/dL (ref 30.0–36.0)
MCV: 90.2 fL (ref 80.0–100.0)
Monocytes Absolute: 1 10*3/uL (ref 0.1–1.0)
Monocytes Relative: 21 %
Neutro Abs: 2.3 10*3/uL (ref 1.7–7.7)
Neutrophils Relative %: 47 %
Platelets: 112 10*3/uL — ABNORMAL LOW (ref 150–400)
RBC: 4.1 MIL/uL — ABNORMAL LOW (ref 4.22–5.81)
RDW: 14 % (ref 11.5–15.5)
WBC: 4.9 10*3/uL (ref 4.0–10.5)
nRBC: 0 % (ref 0.0–0.2)

## 2021-09-27 LAB — RESP PANEL BY RT-PCR (FLU A&B, COVID) ARPGX2
Influenza A by PCR: NEGATIVE
Influenza B by PCR: NEGATIVE
SARS Coronavirus 2 by RT PCR: POSITIVE — AB

## 2021-09-27 LAB — BASIC METABOLIC PANEL
Anion gap: 9 (ref 5–15)
BUN: 12 mg/dL (ref 8–23)
CO2: 23 mmol/L (ref 22–32)
Calcium: 7.9 mg/dL — ABNORMAL LOW (ref 8.9–10.3)
Chloride: 102 mmol/L (ref 98–111)
Creatinine, Ser: 1.44 mg/dL — ABNORMAL HIGH (ref 0.61–1.24)
GFR, Estimated: 51 mL/min — ABNORMAL LOW (ref >60)
Glucose, Bld: 98 mg/dL (ref 70–99)
Potassium: 3.3 mmol/L — ABNORMAL LOW (ref 3.5–5.1)
Sodium: 134 mmol/L — ABNORMAL LOW (ref 135–145)

## 2021-09-27 MED ORDER — NIRMATRELVIR/RITONAVIR (PAXLOVID) TABLET (RENAL DOSING)
2.0000 | ORAL_TABLET | Freq: Two times a day (BID) | ORAL | Status: DC
  Administered 2021-09-27: 2 via ORAL
  Filled 2021-09-27: qty 20

## 2021-09-27 NOTE — ED Notes (Signed)
Date and time results received: 09/27/21 0139  Test: COVID Critical Value: POSITIVE  Name of Provider Notified: Betsey Holiday, MD

## 2021-09-27 NOTE — ED Provider Notes (Signed)
[facility] EMERGENCY DEPARTMENT Provider Note   CSN: [id number] Arrival date & time: 09/26/21  2306     History  Chief Complaint  Patient presents with   Cough    Darius Smith is a 76 y.o. male.  Presents to the emergency department for evaluation of positive COVID test at home.  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  He became sick 2 nights ago with a cough and congestion.  He had an outpatient chest x-ray that showed bronchitis and was started on antibiotics.  A COVID test at that time was negative.  Wife reports that she received a call tonight that he was exposed to COVID 2 days before he became sick.  She repeated his test tonight and it was positive.      Home Medications Prior to Admission medications   Medication Sig Start Date End Date Taking? Authorizing Provider  acetaminophen (TYLENOL) 500 MG tablet Take 1,000 mg by mouth every 6 (six) hours as needed for moderate pain or headache.    [provider]  amiodarone (PACERONE) 200 MG tablet Take 1 tablet (200 mg total) by mouth daily. 02/23/21   Fenton, Clint R, PA  amLODipine (NORVASC) 10 MG tablet Take 1 tablet (10 mg total) by mouth daily. 12/10/20   Arnoldo Lenis, MD  amoxicillin-clavulanate (AUGMENTIN) 875-125 MG tablet Take 1 tablet by mouth 2 (two) times daily. 09/25/21   [name], MD  apixaban (ELIQUIS) 5 MG TABS tablet TAKE ONE TABLET BY MOUTH 2 TIMES A DAY. 06/15/21   Arnoldo Lenis, MD  benzonatate (TESSALON) 100 MG capsule Take 1 capsule (100 mg total) by mouth 2 (two) times daily as needed for cough. 09/25/21   [name], MD  blood glucose meter kit and supplies KIT Inject 1 each into the skin 4 (four) times daily -  before meals and at bedtime. Dispense based on patient and insurance preference. Use up to four times daily as directed. 06/02/21   Barb Merino, MD  cetirizine (ZYRTEC) 10 MG tablet Take 1 tablet (10 mg total) by mouth daily as  needed for allergies. 01/01/20   Maryruth Hancock, MD  Continuous Blood Gluc Receiver (FREESTYLE LIBRE 2 READER) DEVI Use to monitor blood glucose continuously 06/09/21   [provider]  Continuous Blood Gluc Sensor (FREESTYLE LIBRE 2 SENSOR) MISC Apply 1 each topically every 14 (fourteen) days. 06/09/21   [provider]  furosemide (LASIX) 20 MG tablet Take 1 tablet (20 mg total) by mouth every other day. (MAY TAKE DAILY AS NEEDED FOR INCREASED SWELLING) 04/21/21   Arnoldo Lenis, MD  hydrALAZINE (APRESOLINE) 100 MG tablet Take 1 tablet (100 mg total) by mouth 2 (two) times daily. 06/10/21   Arnoldo Lenis, MD  insulin lispro (HUMALOG KWIKPEN) 100 UNIT/ML KwikPen Inject 1-10 Units into the skin 4 (four) times daily -  before meals and at bedtime. Blood sugars 70-120: 1 unit 121-150: 2 units 151-200: 3 units 201-250: 4 units 251-300: 5 units 301-350: 6 units 351-400: 9 units More than 400, 10 units and call your doctor 06/02/21   Barb Merino, MD  Insulin Pen Needle 29G X 12MM MISC 1 each by Does not apply route 4 (four) times daily -  before meals and at bedtime. 07/09/21   Fayrene Helper, MD  isosorbide mononitrate (IMDUR) 30 MG 24 hr tablet TAKE 1/2 TABLET BY MOUTH ONCE DAILY. 11/10/20   Arnoldo Lenis, MD  Lancets [facility]  DELICA PLUS [id number]) MISC 1 each by Other route 4 (four) times daily. 06/02/21   [provider]  lenalidomide (REVLIMID) 10 MG capsule Take 10 mg by mouth. Take 10 mg on days 1-21 of a 28 day cycle. 07/13/21   [provider]  metoprolol succinate (TOPROL XL) 25 MG 24 hr tablet Take 0.5 tablets (12.5 mg total) by mouth daily. 07/09/21   Arnoldo Lenis, MD  omeprazole (PRILOSEC) 20 MG capsule Take 1 capsule (20 mg total) by mouth daily as needed (reflux). 06/15/21   [name], MD  [facility] ULTRA test strip 1 each by Other route 4 (four) times daily. 07/09/21   Fayrene Helper, MD  Potassium Chloride Crys ER (KLOR-CON M20  PO) Take 1 tablet by mouth daily.    [provider]  VENTOLIN HFA 108 (90 Base) MCG/ACT inhaler Inhale 2 puffs into the lungs every 6 (six) hours as needed for wheezing or shortness of breath. 07/11/20   Fayrene Helper, MD      Allergies    Flomax [tamsulosin hcl] and Heparin    Review of Systems   Review of Systems  Physical Exam Updated Vital Signs BP 121/73    Pulse (!) 52    Temp 98.1 F (36.7 C) (Oral)    Resp 18    Ht 6' (1.829 m)    Wt 82 kg    SpO2 97%    BMI 24.52 kg/m  Physical Exam Vitals and nursing note reviewed.  Constitutional:      General: He is not in acute distress.    Appearance: Normal appearance. He is well-developed.  HENT:     Head: Normocephalic and atraumatic.     Right Ear: Hearing normal.     Left Ear: Hearing normal.     Nose: Nose normal.  Eyes:     Conjunctiva/sclera: Conjunctivae normal.     Pupils: Pupils are equal, round, and reactive to light.  Cardiovascular:     Rate and Rhythm: Regular rhythm.     Heart sounds: S1 normal and S2 normal. No murmur heard.   No friction rub. No gallop.  Pulmonary:     Effort: Pulmonary effort is normal. No respiratory distress.     Breath sounds: Normal breath sounds.  Chest:     Chest wall: No tenderness.  Abdominal:     General: Bowel sounds are normal.     Palpations: Abdomen is soft.     Tenderness: There is no abdominal tenderness. There is no guarding or rebound. Negative signs include Murphy's sign and McBurney's sign.     Hernia: No hernia is present.  Musculoskeletal:        General: Normal range of motion.     Cervical back: Normal range of motion and neck supple.  Skin:    General: Skin is warm and dry.     Findings: No rash.  Neurological:     Mental Status: He is alert and oriented to person, place, and time.     GCS: GCS eye subscore is 4. GCS verbal subscore is 5. GCS motor subscore is 6.     Cranial Nerves: No cranial nerve deficit.     Sensory: No sensory deficit.      Coordination: Coordination normal.  Psychiatric:        Speech: Speech normal.        Behavior: Behavior normal.        Thought Content: Thought content normal.    ED  Results / Procedures / Treatments   Labs (all labs ordered are listed, but only abnormal results are displayed) Labs Reviewed  RESP PANEL BY RT-PCR (FLU A&B, COVID) ARPGX2 - Abnormal; Notable for the following components:      Result Value   SARS Coronavirus 2 by RT PCR POSITIVE (*)    All other components within normal limits  CBC WITH DIFFERENTIAL/PLATELET - Abnormal; Notable for the following components:   RBC 4.10 (*)    Hemoglobin 11.7 (*)    HCT 37.0 (*)    Platelets 112 (*)    All other components within normal limits  BASIC METABOLIC PANEL - Abnormal; Notable for the following components:   Sodium 134 (*)    Potassium 3.3 (*)    Creatinine, Ser 1.44 (*)    Calcium 7.9 (*)    GFR, Estimated 51 (*)    All other components within normal limits    EKG None  Radiology DG Chest 2 View  Result Date: 09/25/2021 CLINICAL DATA:  Cough. EXAM: CHEST - 2 VIEW COMPARISON:  June 01, 2021 FINDINGS: Mild, diffuse, chronic appearing increased interstitial lung markings are seen. Mild bilateral infrahilar atelectasis is noted. There is no evidence of a pleural effusion or pneumothorax. The heart size and mediastinal contours are within normal limits. Radiopaque surgical clips are seen along the lateral aspect of the mid left chest wall. Radiopaque surgical clips are seen within the right upper quadrant. The visualized skeletal structures are unremarkable. IMPRESSION: Chronic appearing increased interstitial lung markings with mild bilateral infrahilar atelectasis. Electronically Signed   By: Virgina Norfolk M.D.   On: 09/25/2021 21:39   DG Chest Port 1 View  Result Date: 09/27/2021 CLINICAL DATA:  Cough.  Positive home COVID test. EXAM: PORTABLE CHEST 1 VIEW COMPARISON:  Radiograph 2 days ago.  PET CT 05/14/2021  reviewed FINDINGS: Unchanged cardiomegaly. Stable mediastinal contours. Minor patchy opacity in the left mid lung. Chronic interstitial coarsening. No pulmonary edema, pleural effusion, or pneumothorax. Surgical clips in the left axilla. IMPRESSION: 1. Minor patchy opacity in the left mid lung, may represent atelectasis or pneumonia in the setting of COVID-19. 2. Stable cardiomegaly. Electronically Signed   By: Keith Rake M.D.   On: 09/27/2021 00:28    Procedures Procedures    Medications Ordered in ED Medications  nirmatrelvir/ritonavir EUA (renal dosing) (PAXLOVID) 2 tablet (2 tablets Oral Given 09/27/21 0309)    ED Course/ Medical Decision Making/ A&P                           Medical Decision Making  Patient with history of hypertension, GERD, atrial fibrillation, congestive heart failure, chronic kidney disease, lymphoma presents to the emergency department with concerns over URI symptoms and positive COVID test.  Patient has had cough and congestion without shortness of breath for 2 days.  He had a positive COVID test at home tonight.  His doctor has already called in an antibiotic for his cough.  Chest x-ray shows changes that are likely viral pneumonia.  COVID is positive.  He is not neutropenic or febrile.  He appears well, no hypoxia.  He has had multiple vaccinations.  He is still high risk secondary to age and comorbidities.  We will start Paxlovid but does not require hospitalization.        Final Clinical Impression(s) / ED Diagnoses Final diagnoses:  [id number]    Rx / DC Orders ED Discharge Orders  None         Orpah Greek, MD 09/27/21 [phone]

## 2021-10-02 ENCOUNTER — Other Ambulatory Visit: Payer: Self-pay | Admitting: Internal Medicine

## 2021-10-02 ENCOUNTER — Other Ambulatory Visit: Payer: Self-pay | Admitting: Physician Assistant

## 2021-10-02 ENCOUNTER — Telehealth: Payer: Self-pay

## 2021-10-02 NOTE — Telephone Encounter (Signed)
Patient also had covid 01.06.2023 asking still test today for positive if finished his medicine. Call back # [phone]

## 2021-10-02 NOTE — Telephone Encounter (Signed)
Pt notified that as long as she is symptom free and no fever after 5 days she is free to come off quarantine

## 2021-10-06 ENCOUNTER — Encounter: Payer: Self-pay | Admitting: Internal Medicine

## 2021-10-06 ENCOUNTER — Other Ambulatory Visit: Payer: Self-pay | Admitting: Internal Medicine

## 2021-10-06 DIAGNOSIS — J209 Acute bronchitis, unspecified: Secondary | ICD-10-CM

## 2021-10-06 MED ORDER — METHYLPREDNISOLONE 4 MG PO TBPK: ORAL_TABLET | ORAL | 0 refills | Status: DC

## 2021-10-07 ENCOUNTER — Other Ambulatory Visit: Payer: Self-pay | Admitting: *Deleted

## 2021-10-07 ENCOUNTER — Encounter: Payer: Self-pay | Admitting: Cardiology

## 2021-10-07 MED ORDER — POTASSIUM CHLORIDE CRYS ER 20 MEQ PO TBCR: 20.0000 meq | EXTENDED_RELEASE_TABLET | Freq: Every day | ORAL | 6 refills | Status: DC

## 2021-10-19 ENCOUNTER — Other Ambulatory Visit: Payer: Self-pay | Admitting: Internal Medicine

## 2021-10-19 DIAGNOSIS — E1136 Type 2 diabetes mellitus with diabetic cataract: Secondary | ICD-10-CM | POA: Diagnosis not present

## 2021-10-19 DIAGNOSIS — H25813 Combined forms of age-related cataract, bilateral: Secondary | ICD-10-CM | POA: Diagnosis not present

## 2021-10-19 DIAGNOSIS — H52203 Unspecified astigmatism, bilateral: Secondary | ICD-10-CM | POA: Diagnosis not present

## 2021-10-19 DIAGNOSIS — H5203 Hypermetropia, bilateral: Secondary | ICD-10-CM | POA: Diagnosis not present

## 2021-10-19 LAB — HM DIABETES EYE EXAM

## 2021-10-20 ENCOUNTER — Encounter: Payer: Self-pay | Admitting: *Deleted

## 2021-10-21 DIAGNOSIS — I130 Hypertensive heart and chronic kidney disease with heart failure and stage 1 through stage 4 chronic kidney disease, or unspecified chronic kidney disease: Secondary | ICD-10-CM | POA: Diagnosis not present

## 2021-10-21 DIAGNOSIS — I509 Heart failure, unspecified: Secondary | ICD-10-CM | POA: Diagnosis not present

## 2021-10-21 DIAGNOSIS — Z803 Family history of malignant neoplasm of breast: Secondary | ICD-10-CM | POA: Diagnosis not present

## 2021-10-21 DIAGNOSIS — Z7952 Long term (current) use of systemic steroids: Secondary | ICD-10-CM | POA: Diagnosis not present

## 2021-10-21 DIAGNOSIS — C8510 Unspecified B-cell lymphoma, unspecified site: Secondary | ICD-10-CM | POA: Diagnosis not present

## 2021-10-21 DIAGNOSIS — Z79899 Other long term (current) drug therapy: Secondary | ICD-10-CM | POA: Diagnosis not present

## 2021-10-21 DIAGNOSIS — I4891 Unspecified atrial fibrillation: Secondary | ICD-10-CM | POA: Diagnosis not present

## 2021-10-21 DIAGNOSIS — C8250 Diffuse follicle center lymphoma, unspecified site: Secondary | ICD-10-CM | POA: Diagnosis not present

## 2021-10-21 DIAGNOSIS — Z5111 Encounter for antineoplastic chemotherapy: Secondary | ICD-10-CM | POA: Diagnosis not present

## 2021-10-21 DIAGNOSIS — Z801 Family history of malignant neoplasm of trachea, bronchus and lung: Secondary | ICD-10-CM | POA: Diagnosis not present

## 2021-10-21 DIAGNOSIS — N183 Chronic kidney disease, stage 3 unspecified: Secondary | ICD-10-CM | POA: Diagnosis not present

## 2021-10-27 ENCOUNTER — Ambulatory Visit (INDEPENDENT_AMBULATORY_CARE_PROVIDER_SITE_OTHER): Payer: Medicare Other | Admitting: *Deleted

## 2021-10-27 ENCOUNTER — Other Ambulatory Visit: Payer: Self-pay | Admitting: Internal Medicine

## 2021-10-27 DIAGNOSIS — E119 Type 2 diabetes mellitus without complications: Secondary | ICD-10-CM

## 2021-10-27 DIAGNOSIS — C8258 Diffuse follicle center lymphoma, lymph nodes of multiple sites: Secondary | ICD-10-CM

## 2021-10-27 DIAGNOSIS — I5022 Chronic systolic (congestive) heart failure: Secondary | ICD-10-CM

## 2021-10-27 MED ORDER — ONETOUCH ULTRA VI STRP: 1.0000 | ORAL_STRIP | Freq: Four times a day (QID) | 3 refills | Status: DC

## 2021-10-27 NOTE — Progress Notes (Signed)
Results discussed with patient's wife per notes

## 2021-10-27 NOTE — Chronic Care Management (AMB) (Signed)
Chronic Care Management   CCM RN Visit Note  10/27/2021 Name: Darius Smith MRN: [id number] DOB: 03-25-1946  Subjective: Darius Smith is a 76 y.o. year old male who is a primary care patient of [name], MD. The care management team was consulted for assistance with disease management and care coordination needs.    Engaged with patient by telephone for follow up visit in response to provider referral for case management and/or care coordination services.   Consent to Services:  The patient was given information about Chronic Care Management services, agreed to services, and gave verbal consent prior to initiation of services.  Please see initial visit note for detailed documentation.   Patient agreed to services and verbal consent obtained.   Assessment: Review of patient past medical history, allergies, medications, health status, including review of consultants reports, laboratory and other test data, was performed as part of comprehensive evaluation and provision of chronic care management services.   SDOH (Social Determinants of Health) assessments and interventions performed:    CCM Care Plan  Allergies  Allergen Reactions   Flomax [Tamsulosin Hcl] Nausea Only    Dizziness  Pt is on flomax   Heparin Other (See Comments)    Tingling in face and shortness of breath    Outpatient Encounter Medications as of 10/27/2021  Medication Sig Note   acetaminophen (TYLENOL) 500 MG tablet Take 1,000 mg by mouth every 6 (six) hours as needed for moderate pain or headache.    amiodarone (PACERONE) 200 MG tablet Take 1 tablet (200 mg total) by mouth daily.    amLODipine (NORVASC) 10 MG tablet Take 1 tablet (10 mg total) by mouth daily.    apixaban (ELIQUIS) 5 MG TABS tablet TAKE ONE TABLET BY MOUTH 2 TIMES A DAY.    blood glucose meter kit and supplies KIT Inject 1 each into the skin 4 (four) times daily -  before meals and at bedtime. Dispense based on patient and insurance preference.  Use up to four times daily as directed.    cetirizine (ZYRTEC) 10 MG tablet Take 1 tablet (10 mg total) by mouth daily as needed for allergies.    Continuous Blood Gluc Receiver (FREESTYLE LIBRE 2 READER) DEVI Use to monitor blood glucose continuously    Continuous Blood Gluc Sensor (FREESTYLE LIBRE 2 SENSOR) MISC USE TO MONITOR BLOOD SUGAR DAILY. (CHANGE EVERY 2WEEKS)    furosemide (LASIX) 20 MG tablet Take 1 tablet (20 mg total) by mouth every other day. (MAY TAKE DAILY AS NEEDED FOR INCREASED SWELLING)    hydrALAZINE (APRESOLINE) 100 MG tablet Take 1 tablet (100 mg total) by mouth 2 (two) times daily.    insulin lispro (HUMALOG KWIKPEN) 100 UNIT/ML KwikPen Inject 1-10 Units into the skin 4 (four) times daily -  before meals and at bedtime. Blood sugars 70-120: 1 unit 121-150: 2 units 151-200: 3 units 201-250: 4 units 251-300: 5 units 301-350: 6 units 351-400: 9 units More than 400, 10 units and call your doctor 07/16/2021: Patient only using if blood glucose >180 per instructions from Dr. Moshe Cipro   Insulin Pen Needle 29G X 12MM MISC 1 each by Does not apply route 4 (four) times daily -  before meals and at bedtime.    isosorbide mononitrate (IMDUR) 30 MG 24 hr tablet TAKE 1/2 TABLET BY MOUTH ONCE DAILY.    Lancets (ONETOUCH DELICA PLUS FOYDXA12I) MISC 1 each by Other route 4 (four) times daily.    lenalidomide (REVLIMID) 10 MG capsule  Take 10 mg by mouth. Take 10 mg on days 1-21 of a 28 day cycle.    metoprolol succinate (TOPROL XL) 25 MG 24 hr tablet Take 0.5 tablets (12.5 mg total) by mouth daily.    omeprazole (PRILOSEC) 20 MG capsule Take 1 capsule (20 mg total) by mouth daily as needed (reflux).    ONETOUCH ULTRA test strip 1 each by Other route 4 (four) times daily.    potassium chloride SA (KLOR-CON M20) 20 MEQ tablet Take 1 tablet (20 mEq total) by mouth daily.    VENTOLIN HFA 108 (90 Base) MCG/ACT inhaler Inhale 2 puffs into the lungs every 6 (six) hours as needed for wheezing or shortness  of breath.    amoxicillin-clavulanate (AUGMENTIN) 875-125 MG tablet Take 1 tablet by mouth 2 (two) times daily. (Patient not taking: Reported on 10/27/2021)    benzonatate (TESSALON) 100 MG capsule Take 1 capsule (100 mg total) by mouth 2 (two) times daily as needed for cough.    methylPREDNISolone (MEDROL DOSEPAK) 4 MG TBPK tablet Take as package instructions.    No facility-administered encounter medications on file as of 10/27/2021.    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   Atrial fibrillation ([place]) 03/20/2020   Essential hypertension 08/08/2014   GERD (gastroesophageal reflux disease) 08/08/2014    Conditions to be addressed/monitored:CHF and Cancer  Care Plan : RN Care Manager plan of care  Updates made by Kassie Mends, RN since 10/27/2021 12:00 AM     Problem: No plan of care established for management of chronic disease states (CHF, cancer, A-fib, HTN)   Priority: High     Long-Range Goal: Development of plan of care for chronic disease management (CHF, cancer, A-fib, HTN)   Start Date: 08/04/2021  Expected End Date: 03/09/2022  Priority: High  Note:   Current Barriers:  Knowledge Deficits related to plan of care for management of CHF and Cancer (follicular lymphoma)  Patient lives with wife [name] and she provides oversight for all of patient's care, they have adult children.  [name] reports pt requires assistance with bathing, walking, cooking, etc, has needed DME in the home and uses walker and cane for ambulation.   Patient sees oncologist at [facility] and had MRI to further assess plan of care for cancer, started chemotherapy infusions and had 4 treatments total in October and November and will restart 09/30/21 and continues Revlimid oral med 21 days on and 7 days off, per spouse, pt then had brain MRI and full body PET scan on 08/2021 and states there is "improvement in different parts of his body". Update- pt started chemotherapy again last Wednesday and will be on an every 8 week schedule, due to have brain MRI and full body PET scan on 12/02/21. Pt is eating well, has all medications and taking as prescribed, denies pain and nausea. Patient does try to walk through the house to stay active, is sleeping well, did receive flu vaccine.  Wife is very supportive and very involved with patient's care. Patient does weigh daily  and record, weight today 173.8 pounds.  Patient now has [employer] (when receiving steroids with chemotherapy increases blood sugar) spouse reports fasting readings 86-103 ranges, evening readings 114-139. Patient went to ED 09/26/21 with Covid and is overall better now except for a feeling of "water and popping in ears and can't hardly hear anything"  Spouse reports PA at cancer center [facility] assessed ears and saw buildup of wax and patient used Milta Deiters Med drops but this did not help, spouse ask if pt should see ENT or report to primary care provider. Patient is weighing daily with weight today 173.8 pounds RNCM Clinical Goal(s):  Patient/ spouse will verbalize understanding of disease states CHF, Cancer as evidenced by pt/ spouse report, review of EHR,  through collaboration with RN Care manager, provider, and care team.  Patient will take all medications as prescribed as evidenced by pt/ spouse report, review of EHR and collaboration with CCM pharmacist.  Interventions: 1:1 collaboration with primary care provider regarding development and update of comprehensive plan of care as evidenced by  provider attestation and co-signature Inter-disciplinary care team collaboration (see longitudinal plan of care) Evaluation of current treatment plan related to  self management and patient's adherence to plan as established by provider  Heart Failure Interventions: Discussed importance of daily weight and advised patient to weigh and record daily Discussed the importance of keeping all appointments with provider Reviewed heart failure action plan and importance of calling doctor early on for change in health status, symptoms with emphasis on yellow zone Reviewed low sodium diet and food choices Reviewed importance of taking medications as prescribed  Oncology:  (Status: New goal.) Assessment of understanding of oncology diagnosis:  Assessed patient understanding of cancer diagnosis and recommended treatment plan and Reviewed upcoming provider appointments and treatment appointments  Reinforced infection precautions and importance of staying well, handwashing, wearing a mask as needed, avoiding sick persons Reinforced energy conservation- alternating activity with rest Assessed with spouse that patient does have palliative care currently- nurse continues to call Reviewed upcoming scheduled appointments Reviewed drinking adequate fluids and eating healthy diet Pain assessment completed In basket sent to primary care provider reporting pt having difficulty hearing and feels as if water in his ear, spouse would like follow up on what to do, reported pt needs refill sent to [facility] in [place] for One Touch Ultra TST strips  Patient Goals/Self-Care Activities: Patient will self administer medications as prescribed as evidenced by self report/primary caregiver report  Patient will attend all scheduled provider appointments as evidenced by clinician review of documented attendance to scheduled appointments and patient/caregiver report Patient will call provider office for new  concerns or questions as evidenced by review of documented incoming telephone call notes and patient report Continue to weigh daily and record Call your doctor for 3 pound weight gain overnight or 5 pound weight gain in one week Follow low sodium diet- read food labels, avoid fast food Practice good handwashing and wear a mask as needed and avoid sick persons Alternate activity with rest Continue to work with palliative care and call them for questions or concerns Call RN care manager if you have questions at [phone] Follow up with doctor about issues with hearing/ wax buildup, this has been reported Check with your pharmacy to see if prescription ready for One Touch Ultra strips      Plan:Telephone follow up appointment with care management team member scheduled for:  12/15/21  Jacqlyn Larsen [facility], BSN RN Case Manager [facility] [phone]

## 2021-10-27 NOTE — Patient Instructions (Signed)
Visit Information  Thank you for taking time to visit with me today. Please don't hesitate to contact me if I can be of assistance to you before our next scheduled telephone appointment.  Following are the goals we discussed today:  Patient will self administer medications as prescribed as evidenced by self report/primary caregiver report  Patient will attend all scheduled provider appointments as evidenced by clinician review of documented attendance to scheduled appointments and patient/caregiver report Patient will call provider office for new concerns or questions as evidenced by review of documented incoming telephone call notes and patient report Continue to weigh daily and record Call your doctor for 3 pound weight gain overnight or 5 pound weight gain in one week Follow low sodium diet- read food labels, avoid fast food Practice good handwashing and wear a mask as needed and avoid sick persons Alternate activity with rest Continue to work with palliative care and call them for questions or concerns Call RN care manager if you have questions at [phone] Follow up with doctor about issues with hearing/ wax buildup, this has been reported Check with your pharmacy to see if prescription ready for One Touch Ultra strips  Our next appointment is by telephone on 12/15/21 at 215 pm  Please call the care guide team at [phone] if you need to cancel or reschedule your appointment.   If you are experiencing a Mental Health or [place] or need someone to talk to, please call the Suicide and Crisis Lifeline: 988 call the [place] National Suicide Prevention Lifeline: 7860706016 or TTY: 267-549-9372 TTY (854) 020-5358) to talk to a trained counselor call 1-800-273-TALK (toll free, 24 hour hotline) go to [facility] [address] [place] [phone]) call 911   Patient verbalizes understanding of instructions and care plan provided  today and agrees to view in [place]. Active MyChart status confirmed with patient.    Jacqlyn Larsen [facility], BSN RN Case Manager [place] [facility] [phone]

## 2021-10-28 ENCOUNTER — Ambulatory Visit: Payer: Self-pay | Admitting: *Deleted

## 2021-10-28 ENCOUNTER — Encounter: Payer: Self-pay | Admitting: *Deleted

## 2021-10-28 DIAGNOSIS — C8258 Diffuse follicle center lymphoma, lymph nodes of multiple sites: Secondary | ICD-10-CM

## 2021-10-28 DIAGNOSIS — I5022 Chronic systolic (congestive) heart failure: Secondary | ICD-10-CM

## 2021-10-28 NOTE — Patient Instructions (Signed)
Visit Information  Thank you for taking time to visit with me today. Please don't hesitate to contact me if I can be of assistance to you before our next scheduled telephone appointment.  Following are the goals we discussed today:  Call RN care manager if you have questions at [phone] Follow up with doctor about issues with hearing/ wax buildup, this has been reported Try Debrox ear drops and if you continue have issues, please schedule a visit with your doctor  Our next appointment is telephone outreach on 12/15/21 at 215 pm  Please call the care guide team at [phone] if you need to cancel or reschedule your appointment.   If you are experiencing a Mental Health or [place] or need someone to talk to, please call the [place] National Suicide Prevention Lifeline: 579-428-3745 or TTY: 870-445-0706 TTY 847-410-5096) to talk to a trained counselor call 1-800-273-TALK (toll free, 24 hour hotline) go to [facility] [address] [place] [phone]) call 911   Patient verbalizes understanding of instructions and care plan provided today and agrees to view in [place]. Active MyChart status confirmed with patient.    Jacqlyn Larsen [facility], BSN RN Case Manager [place] [facility] [phone]

## 2021-10-28 NOTE — Chronic Care Management (AMB) (Signed)
Chronic Care Management   CCM RN Visit Note  10/28/2021 Name: Darius Smith MRN: [id number] DOB: [birth year]  Subjective: Darius Smith is a 76 y.o. year old male who is a primary care patient of [name], MD. The care management team was consulted for assistance with disease management and care coordination needs.    Engaged with patient by telephone for follow up visit in response to provider referral for case management and/or care coordination services.   Consent to Services:  The patient was given the following information about Chronic Care Management services today, agreed to services, and gave verbal consent: 1. CCM service includes personalized support from designated clinical staff supervised by the primary care provider, including individualized plan of care and coordination with other care providers 2. 24/7 contact phone numbers for assistance for urgent and routine care needs. 3. Service will only be billed when office clinical staff spend 20 minutes or more in a month to coordinate care. 4. Only one practitioner may furnish and bill the service in a calendar month. 5.The patient may stop CCM services at any time (effective at the end of the month) by phone call to the office staff. 6. The patient will be responsible for cost sharing (co-pay) of up to 20% of the service fee (after annual deductible is met). Patient agreed to services and consent obtained.  Patient agreed to services and verbal consent obtained.   Assessment: Review of patient past medical history, allergies, medications, health status, including review of consultants reports, laboratory and other test data, was performed as part of comprehensive evaluation and provision of chronic care management services.   SDOH (Social Determinants of Health) assessments and interventions performed:    CCM Care Plan  Allergies  Allergen Reactions   Flomax [Tamsulosin Hcl] Nausea Only    Dizziness  Pt is on flomax    Heparin Other (See Comments)    Tingling in face and shortness of breath    Outpatient Encounter Medications as of 10/28/2021  Medication Sig Note   acetaminophen (TYLENOL) 500 MG tablet Take 1,000 mg by mouth every 6 (six) hours as needed for moderate pain or headache.    amiodarone (PACERONE) 200 MG tablet Take 1 tablet (200 mg total) by mouth daily.    amLODipine (NORVASC) 10 MG tablet Take 1 tablet (10 mg total) by mouth daily.    amoxicillin-clavulanate (AUGMENTIN) 875-125 MG tablet Take 1 tablet by mouth 2 (two) times daily. (Patient not taking: Reported on 10/27/2021)    apixaban (ELIQUIS) 5 MG TABS tablet TAKE ONE TABLET BY MOUTH 2 TIMES A DAY.    benzonatate (TESSALON) 100 MG capsule Take 1 capsule (100 mg total) by mouth 2 (two) times daily as needed for cough.    blood glucose meter kit and supplies KIT Inject 1 each into the skin 4 (four) times daily -  before meals and at bedtime. Dispense based on patient and insurance preference. Use up to four times daily as directed.    cetirizine (ZYRTEC) 10 MG tablet Take 1 tablet (10 mg total) by mouth daily as needed for allergies.    Continuous Blood Gluc Receiver (FREESTYLE LIBRE 2 READER) DEVI Use to monitor blood glucose continuously    Continuous Blood Gluc Sensor (FREESTYLE LIBRE 2 SENSOR) MISC USE TO MONITOR BLOOD SUGAR DAILY. (CHANGE EVERY 2WEEKS)    furosemide (LASIX) 20 MG tablet Take 1 tablet (20 mg total) by mouth every other day. (MAY TAKE DAILY AS NEEDED FOR INCREASED SWELLING)  glucose blood (ONETOUCH ULTRA) test strip 1 each by Other route 4 (four) times daily.    hydrALAZINE (APRESOLINE) 100 MG tablet Take 1 tablet (100 mg total) by mouth 2 (two) times daily.    insulin lispro (HUMALOG KWIKPEN) 100 UNIT/ML KwikPen Inject 1-10 Units into the skin 4 (four) times daily -  before meals and at bedtime. Blood sugars 70-120: 1 unit 121-150: 2 units 151-200: 3 units 201-250: 4 units 251-300: 5 units 301-350: 6 units 351-400: 9 units  More than 400, 10 units and call your doctor 07/16/2021: Patient only using if blood glucose >180 per instructions from Dr. Moshe Cipro   Insulin Pen Needle 29G X 12MM MISC 1 each by Does not apply route 4 (four) times daily -  before meals and at bedtime.    isosorbide mononitrate (IMDUR) 30 MG 24 hr tablet TAKE 1/2 TABLET BY MOUTH ONCE DAILY.    Lancets (ONETOUCH DELICA PLUS [id number]) MISC 1 each by Other route 4 (four) times daily.    lenalidomide (REVLIMID) 10 MG capsule Take 10 mg by mouth. Take 10 mg on days 1-21 of a 28 day cycle.    methylPREDNISolone (MEDROL DOSEPAK) 4 MG TBPK tablet Take as package instructions.    metoprolol succinate (TOPROL XL) 25 MG 24 hr tablet Take 0.5 tablets (12.5 mg total) by mouth daily.    omeprazole (PRILOSEC) 20 MG capsule Take 1 capsule (20 mg total) by mouth daily as needed (reflux).    potassium chloride SA (KLOR-CON M20) 20 MEQ tablet Take 1 tablet (20 mEq total) by mouth daily.    VENTOLIN HFA 108 (90 Base) MCG/ACT inhaler Inhale 2 puffs into the lungs every 6 (six) hours as needed for wheezing or shortness of breath.    No facility-administered encounter medications on file as of 10/28/2021.    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   Atrial fibrillation ([place]) 03/20/2020   Essential hypertension 08/08/2014   GERD (gastroesophageal reflux disease) 08/08/2014    Conditions to be addressed/monitored:CHF and Cancer  Care Plan : RN Care Manager plan of care  Updates made by Kassie Mends, RN since  10/28/2021 12:00 AM     Problem: No plan of care established for management of chronic disease states (CHF, cancer, A-fib, HTN)   Priority: High     Long-Range Goal: Development of plan of care for chronic disease management (CHF, cancer, A-fib, HTN)   Start Date: 08/04/2021  Expected End Date: 03/09/2022  Priority: High  Note:   Current Barriers:  Knowledge Deficits related to plan of care for management of CHF and Cancer (follicular lymphoma)  Patient lives with wife [name] and she provides oversight for all of patient's care, they have adult children.  [name] reports pt requires assistance with bathing, walking, cooking, etc, has needed DME in the home and uses walker and cane for ambulation.  Patient sees oncologist at [facility] and had MRI to further assess plan of care for cancer, started chemotherapy infusions and had 4 treatments total in October and November and will  restart 09/30/21 and continues Revlimid oral med 21 days on and 7 days off, per spouse, pt then had brain MRI and full body PET scan on 08/2021 and states there is "improvement in different parts of his body". Update- pt started chemotherapy again last Wednesday and will be on an every 8 week schedule, due to have brain MRI and full body PET scan on 12/02/21. Pt is eating well, has all medications and taking as prescribed, denies pain and nausea. Patient does try to walk through the house to stay active, is sleeping well, did receive flu vaccine.  Wife is very supportive and very involved with patient's care. Patient does weigh daily and record, weight today 173.8 pounds.  Patient now has [employer] (when receiving steroids with chemotherapy increases blood sugar) spouse reports fasting readings 86-103 ranges, evening readings 114-139. Patient went to ED 09/26/21 with Covid and is overall better now except for a feeling of "water and popping in ears and can't hardly hear anything"  Spouse reports PA at cancer center at [facility]  assessed ears and saw buildup of wax and patient used Milta Deiters Med drops but this did not help, spouse ask if pt should see ENT or report to primary care provider. Patient is weighing daily with weight today 173.8 pounds 10/28/21- spoke with patient's spouse to give update/ instructions regarding patient's issues with hearing/ water in the ears/ wax buildup RNCM Clinical Goal(s):  Patient/ spouse will verbalize understanding of disease states CHF, Cancer as evidenced by pt/ spouse report, review of EHR,  through collaboration with RN Care manager, provider, and care team.  Patient will take all medications as prescribed as evidenced by pt/ spouse report, review of EHR and collaboration with CCM pharmacist.  Interventions: 1:1 collaboration with primary care provider regarding development and update of comprehensive plan of care as evidenced by provider attestation and co-signature Inter-disciplinary care team collaboration (see longitudinal plan of care) Evaluation of current treatment plan related to  self management and patient's adherence to plan as established by provider  Heart Failure Interventions: Discussed importance of daily weight and advised patient to weigh and record daily Discussed the importance of keeping all appointments with provider Reviewed heart failure action plan and importance of calling doctor early on for change in health status, symptoms with emphasis on yellow zone Reviewed low sodium diet and food choices Reviewed importance of taking medications as prescribed  Oncology:  (Status: New goal.) Assessment of understanding of oncology diagnosis:  Assessed patient understanding of cancer diagnosis and recommended treatment plan and Reviewed upcoming provider appointments and treatment appointments  Reinforced infection precautions and importance of staying well, handwashing, wearing a mask as needed, avoiding sick persons Reinforced energy conservation- alternating activity  with rest Assessed with spouse that patient does have palliative care currently- nurse continues to call Reviewed upcoming scheduled appointments Reviewed drinking adequate fluids and eating healthy diet Pain assessment completed Instructed spouse - per MD instructions- pt can try Debrox ear drops and if this continues to be a persistent concern, please see your doctor, test strips refilled (spouse has already picked up)  Patient Goals/Self-Care Activities: Patient will self administer medications as prescribed as evidenced by self report/primary caregiver report  Patient will attend all scheduled provider appointments as evidenced by clinician review of documented attendance to scheduled appointments and patient/caregiver report Patient will call provider office for new concerns or questions as evidenced by review of documented incoming telephone call notes and patient report Continue to weigh daily and record  Call your doctor for 3 pound weight gain overnight or 5 pound weight gain in one week Follow low sodium diet- read food labels, avoid fast food Practice good handwashing and wear a mask as needed and avoid sick persons Alternate activity with rest Continue to work with palliative care and call them for questions or concerns Call RN care manager if you have questions at [phone] Follow up with doctor about issues with hearing/ wax buildup, this has been reported Try Debrox ear drops and if you continue have issues, please schedule a visit with your doctor       Plan:Telephone follow up appointment with care management team member scheduled for:  12/15/21  Jacqlyn Larsen [facility], BSN RN Case Manager [facility] [phone]

## 2021-11-02 ENCOUNTER — Encounter: Payer: Self-pay | Admitting: Internal Medicine

## 2021-11-04 ENCOUNTER — Other Ambulatory Visit: Payer: Self-pay

## 2021-11-04 ENCOUNTER — Ambulatory Visit (INDEPENDENT_AMBULATORY_CARE_PROVIDER_SITE_OTHER): Payer: Medicare Other | Admitting: *Deleted

## 2021-11-04 DIAGNOSIS — Z23 Encounter for immunization: Secondary | ICD-10-CM | POA: Diagnosis not present

## 2021-11-10 ENCOUNTER — Encounter: Payer: Self-pay | Admitting: Cardiology

## 2021-11-10 ENCOUNTER — Ambulatory Visit: Payer: Medicare Other | Admitting: Cardiology

## 2021-11-10 VITALS — BP 116/64 | HR 56 | Ht 72.0 in | Wt 180.8 lb

## 2021-11-10 DIAGNOSIS — I10 Essential (primary) hypertension: Secondary | ICD-10-CM

## 2021-11-10 DIAGNOSIS — I5022 Chronic systolic (congestive) heart failure: Secondary | ICD-10-CM | POA: Diagnosis not present

## 2021-11-10 DIAGNOSIS — I4891 Unspecified atrial fibrillation: Secondary | ICD-10-CM | POA: Diagnosis not present

## 2021-11-10 NOTE — Progress Notes (Signed)
Clinical Summary Mr. Kettles is a 76 y.o.male seen today for follow up of the following medical problems.    1. Chronic systolic HF - 12/5623 echo LVEF 20-25%. New diagnosis of systolic HF at that time.  - recent admission 06/2020 with volume overload. BNP  2000s, CXR with edema.  - diuresis was limited by elevation in Cr - with elevation in Cr we stopped his entresto, started hydral/imdur.   - systolic dysfunction thought to be tachy mediated as significant issues with afib with RVR at the same time. - have not pursued ischemic testing as of yet, repeat echo few months after afib controlled and pending renal function     - 11/2020 echo LVEF 50%  06/2021 echo LVEF 50-55% [place]  - toprol 25 mg was stopped during recent admission to [facility], had some low HRs. Last visit we restarted torpol 12.5 mg daily  - no recent edema. Home weights mid 170s.  - compliant with meds     2. Persistent afib - dynamaps do not accuratelty measure his HRs, best measured by EKG>  - issues with elevated HRs recently including during recent admission - difficulty controlling with beta blocker alone, avoiding CCB due to systolic dysfunction, renal function and HF limit antiarrhythmic options. During admission we started amiodarone - failed attempt at [place] 07/16/20, plan to continue oral load and retry in 3 weeks.    - on amio, DCCVs on 12/2 and 09/10/20, last 09/10/20 conversion was succesful. - has been maintaining SR at follow ups.  - no palpitations - home HRs 50s, no specific symptoms. We have kept on low dose toprol for cardiac benefits.  - no bleeding on eliquis.      3. Stage IV lymphoma - from onc notes just monitoring at this time, in absence of symptoms no indciation for therapy.  - I did hear back from oncology, they report his lymphoma is not curable and treatment only helps symptoms. Prognosis can be a few years to a few decades, and they do not see a contraindciation to proceeding with  cath in the future if needed   - on chemo at [facility] - remains on chemo, imaging showing some benefits of chemo per family   4. HTN - he is compliant with meds     5. Leg weakness - admit 06/2021 to [facility] with LE weakness and visual changes, mechanical falls at home - imaging showed metastatic lesions throughout of the spine. Prior imaging had shown brain lesions.  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 COVID - +infection Jan 2023 - managed at home on paxlovid   Wife is former Set designer at [employer]. Very good at keeping track of his weights, vitals etc.    Past Medical History:  Diagnosis Date   Allergic rhinitis 07/02/2019   Arthritis    both hands   Atrial fibrillation ([place]) 10/28/2017   Dr. Harl Bowie   Atrial fibrillation with RVR ([place]) 07/14/2020   Blood in urine    occ   Bronchitis    Cancer ([place])    Basal cell   Cardiomegaly 10/28/2017   CHF (congestive heart failure) ([place])  Complication of anesthesia    Mr. Karstens developed a. fib in recovery after cysto procedure 10/2017 was transferred to [facility] and underwent successful cardioversion   Diabetes [facility])    Diverticulosis of sigmoid colon 08/2017   Noted on colonoscopy   Dysrhythmia    External hemorrhoids 08/2017   GERD (gastroesophageal reflux disease)    History of colon polyps 08/2017   History of kidney stones    History of rectal bleeding    History of right inguinal hernia    Hypertension    Lung cancer ([place]) 06/25/2020   Lymphoma ([place])    Nephrolithiasis 07/02/2019   Rectal bleeding 09/02/2017    Added automatically from request for surgery [id number]   Shortness of breath    Stage 3 chronic kidney disease (HCC)      Allergies  Allergen Reactions   Flomax [Tamsulosin Hcl] Nausea Only    Dizziness  Pt is on flomax   Heparin Other (See Comments)    Tingling in face and shortness of breath     Current Outpatient Medications  Medication Sig Dispense Refill   acetaminophen (TYLENOL) 500 MG tablet Take 1,000 mg by mouth every 6 (six) hours as needed for moderate pain or headache.     amiodarone (PACERONE) 200 MG tablet Take 1 tablet (200 mg total) by mouth daily. 60 tablet 6   amLODipine (NORVASC) 10 MG tablet Take 1 tablet (10 mg total) by mouth daily. 90 tablet 3   amoxicillin-clavulanate (AUGMENTIN) 875-125 MG tablet Take 1 tablet by mouth 2 (two) times daily. (Patient not taking: Reported on 10/27/2021) 14 tablet 0   apixaban (ELIQUIS) 5 MG TABS tablet TAKE ONE TABLET BY MOUTH 2 TIMES A DAY. 60 tablet 5   benzonatate (TESSALON) 100 MG capsule Take 1 capsule (100 mg total) by mouth 2 (two) times daily as needed for cough. 20 capsule 0   blood glucose meter kit and supplies KIT Inject 1 each into the skin 4 (four) times daily -  before meals and at bedtime. Dispense based on patient and insurance preference. Use up to four times daily as directed. 1 each 0   cetirizine (ZYRTEC) 10 MG tablet Take 1 tablet (10 mg total) by mouth daily as needed for allergies. 90 tablet 2   Continuous Blood Gluc Receiver (FREESTYLE LIBRE 2 READER) DEVI Use to monitor blood glucose continuously     Continuous Blood Gluc Sensor (FREESTYLE LIBRE 2 SENSOR) MISC USE TO MONITOR BLOOD SUGAR DAILY. (CHANGE EVERY 2WEEKS) 2 each 2   furosemide (LASIX) 20 MG tablet Take 1 tablet (20 mg total) by mouth every other day. (MAY TAKE DAILY AS NEEDED FOR INCREASED SWELLING)     glucose blood (ONETOUCH ULTRA) test strip 1 each by Other route 4 (four) times daily. 100 each 3   hydrALAZINE (APRESOLINE) 100 MG tablet Take 1  tablet (100 mg total) by mouth 2 (two) times daily. 180 tablet 3   insulin lispro (HUMALOG KWIKPEN) 100 UNIT/ML KwikPen Inject 1-10 Units into the skin 4 (four) times daily -  before meals and at bedtime. Blood sugars 70-120: 1 unit 121-150: 2 units 151-200: 3 units 201-250: 4 units 251-300: 5 units 301-350: 6 units 351-400: 9 units More than 400, 10 units and call your doctor 15 mL 11   Insulin Pen Needle 29G X 12MM MISC 1 each by Does not apply route 4 (four) times daily -  before meals and at bedtime. 100 each 0   isosorbide mononitrate (IMDUR) 30 MG  24 hr tablet TAKE 1/2 TABLET BY MOUTH ONCE DAILY. 90 tablet 3   Lancets (ONETOUCH DELICA PLUS [id number]) MISC 1 each by Other route 4 (four) times daily.     lenalidomide (REVLIMID) 10 MG capsule Take 10 mg by mouth. Take 10 mg on days 1-21 of a 28 day cycle.     methylPREDNISolone (MEDROL DOSEPAK) 4 MG TBPK tablet Take as package instructions. 1 each 0   metoprolol succinate (TOPROL XL) 25 MG 24 hr tablet Take 0.5 tablets (12.5 mg total) by mouth daily. 45 tablet 3   omeprazole (PRILOSEC) 20 MG capsule Take 1 capsule (20 mg total) by mouth daily as needed (reflux). 90 capsule 1   potassium chloride SA (KLOR-CON M20) 20 MEQ tablet Take 1 tablet (20 mEq total) by mouth daily. 30 tablet 6   VENTOLIN HFA 108 (90 Base) MCG/ACT inhaler Inhale 2 puffs into the lungs every 6 (six) hours as needed for wheezing or shortness of breath. 18 g 3   No current facility-administered medications for this visit.     Past Surgical History:  Procedure Laterality Date   AXILLARY LYMPH NODE BIOPSY Left 06/05/2021   Procedure: AXILLARY LYMPH NODE BIOPSY;  Surgeon: Virl Cagey, MD;  Location: [facility] ORS;  Service: General;  Laterality: Left;   CARDIOVERSION     10/2017   CARDIOVERSION N/A 07/16/2020   Procedure: CARDIOVERSION;  Surgeon: Arnoldo Lenis, MD;  Location: [facility] ENDO SUITE;  Service: Endoscopy;  Laterality: N/A;   CARDIOVERSION N/A 08/21/2020    Procedure: CARDIOVERSION;  Surgeon: Satira Sark, MD;  Location: [facility] ORS;  Service: Cardiovascular;  Laterality: N/A;   CARDIOVERSION N/A 09/10/2020   Procedure: CARDIOVERSION;  Surgeon: Josue Hector, MD;  Location: [facility] ENDOSCOPY;  Service: Cardiovascular;  Laterality: N/A;   CHOLECYSTECTOMY     CLEFT LIP REPAIR     several from childhood til 76 years old   [place]     several from childhood til 76 years old   COLONOSCOPY  04/28/2011   Procedure: COLONOSCOPY;  Surgeon: Rogene Houston, MD;  Location: [facility];  Service: Endoscopy;  Laterality: N/A;   COLONOSCOPY N/A 07/18/2014   Procedure: COLONOSCOPY;  Surgeon: Rogene Houston, MD;  Location: [facility];  Service: Endoscopy;  Laterality: N/A;  830   COLONOSCOPY N/A 09/09/2017   Procedure: COLONOSCOPY;  Surgeon: Rogene Houston, MD;  Location: [facility];  Service: Endoscopy;  Laterality: N/A;  955   CYSTOSCOPY/URETEROSCOPY/HOLMIUM LASER/STENT PLACEMENT Bilateral 12/06/2017   Procedure: CYSTOSCOPY/RETROGRADE/URETEROSCOPY/HOLMIUM LASER/STENT EXCHANGE;  Surgeon: Festus Aloe, MD;  Location: [facility] ORS;  Service: Urology;  Laterality: Bilateral;  ONLY NEEDS 60 MIN   HERNIA REPAIR     LYMPH NODE BIOPSY Left    Under left arm   POLYPECTOMY  09/09/2017   Procedure: POLYPECTOMY;  Surgeon: Rogene Houston, MD;  Location: [facility];  Service: Endoscopy;;  colon   vocal cord surgery       Allergies  Allergen Reactions   Flomax [Tamsulosin Hcl] Nausea Only    Dizziness  Pt is on flomax   Heparin Other (See Comments)    Tingling in face and shortness of breath      Family History  Problem Relation Age of Onset   Breast cancer Mother    Prostate cancer Father    COPD Sister      Social History Mr. Stidham reports that he has never smoked. He has never used smokeless tobacco. Mr. Kriesel  reports that he does not currently use alcohol after a past usage of about 7.0 standard drinks per week.   Review  of Systems CONSTITUTIONAL: No weight loss, fever, chills, weakness or fatigue.  HEENT: Eyes: No visual loss, blurred vision, double vision or yellow sclerae.No hearing loss, sneezing, congestion, runny nose or sore throat.  SKIN: No rash or itching.  CARDIOVASCULAR: per hpi RESPIRATORY: No shortness of breath, cough or sputum.  GASTROINTESTINAL: No anorexia, nausea, vomiting or diarrhea. No abdominal pain or blood.  GENITOURINARY: No burning on urination, no polyuria NEUROLOGICAL: No headache, dizziness, syncope, paralysis, ataxia, numbness or tingling in the extremities. No change in bowel or bladder control.  MUSCULOSKELETAL: No muscle, back pain, joint pain or stiffness.  LYMPHATICS: No enlarged nodes. No history of splenectomy.  PSYCHIATRIC: No history of depression or anxiety.  ENDOCRINOLOGIC: No reports of sweating, cold or heat intolerance. No polyuria or polydipsia.  Marland Kitchen   Physical Examination Today's Vitals   11/10/21 1057  BP: 116/64  Pulse: (!) 56  SpO2: 98%  Weight: 180 lb 12.8 oz (82 kg)  Height: 6' (1.829 m)   Body mass index is 24.52 kg/m.  Gen: resting comfortably, no acute distress HEENT: no scleral icterus, pupils equal round and reactive, no palptable cervical adenopathy,  CVL: RRR, no mrg, no jvd Resp: Clear to auscultation bilaterally GI: abdomen is soft, non-tender, non-distended, normal bowel sounds, no hepatosplenomegaly MSK: extremities are warm, no edema.  Skin: warm, no rash Neuro:  no focal deficits Psych: appropriate affect   Diagnostic Studies  11/2020 echo IMPRESSIONS     1. Left ventricular ejection fraction, by estimation, is 50%. The left  ventricle has mildly decreased function. The left ventricle has no  regional wall motion abnormalities. The left ventricular internal cavity  size was mildly dilated. Left ventricular  diastolic parameters are consistent with Grade I diastolic dysfunction  (impaired relaxation). The average left  ventricular global longitudinal  strain is -18.2 %. The global longitudinal strain is normal.   2. Right ventricular systolic function is normal. The right ventricular  size is normal.   3. Left atrial size was severely dilated.   4. Right atrial size was mildly dilated.   5. The mitral valve is normal in structure. No evidence of mitral valve  regurgitation. No evidence of mitral stenosis.   6. The aortic valve was not well visualized. There is mild calcification  of the aortic valve. There is mild thickening of the aortic valve. Aortic  valve regurgitation is mild. Mild aortic valve stenosis.   7. The inferior vena cava is normal in size with greater than 50%  respiratory variability, suggesting right atrial pressure of 3 mmHg.   Comparison(s): Echocardiogram done 06/12/20 showed an EF of 20-25%.   06/2021 echo [facility] SUMMARY  The left ventricular size is normal.  Mild left ventricular hypertrophy  Left ventricular systolic function is low normal.  LV ejection fraction = 50-55%.  The right ventricle is normal in size and function.  There is aortic valve sclerosis.  There is mild aortic regurgitation.  No significant stenosis seen  Estimated right ventricular systolic pressure is 27 mmHg.  The inferior vena cava was not visualized during the exam.  There is no pericardial effusion.  T  There is no comparison study available.      Assessment and Plan  1. Chronic systolic HF - possibly tachy mediated given issues with afib with RVR at time of diagnosis - entresto stopped during recent admission with  [facility].  - from last echo LVEF has normalized.  - no symptoms, continue current meds        2. Afib - doing well, we will continue current meds   3. HTN - bp is at goal, continue current meds      Arnoldo Lenis, M.D.

## 2021-11-10 NOTE — Patient Instructions (Signed)

## 2021-11-11 ENCOUNTER — Other Ambulatory Visit: Payer: Self-pay | Admitting: Cardiology

## 2021-11-17 DIAGNOSIS — C8258 Diffuse follicle center lymphoma, lymph nodes of multiple sites: Secondary | ICD-10-CM | POA: Diagnosis not present

## 2021-11-17 DIAGNOSIS — I5022 Chronic systolic (congestive) heart failure: Secondary | ICD-10-CM | POA: Diagnosis not present

## 2021-11-19 ENCOUNTER — Other Ambulatory Visit: Payer: Self-pay | Admitting: *Deleted

## 2021-11-19 ENCOUNTER — Other Ambulatory Visit: Payer: Self-pay

## 2021-11-19 ENCOUNTER — Encounter: Payer: Self-pay | Admitting: Nurse Practitioner

## 2021-11-19 ENCOUNTER — Other Ambulatory Visit: Payer: Medicare Other | Admitting: Nurse Practitioner

## 2021-11-19 ENCOUNTER — Encounter: Payer: Self-pay | Admitting: Internal Medicine

## 2021-11-19 DIAGNOSIS — Z515 Encounter for palliative care: Secondary | ICD-10-CM

## 2021-11-19 DIAGNOSIS — R0602 Shortness of breath: Secondary | ICD-10-CM | POA: Diagnosis not present

## 2021-11-19 DIAGNOSIS — C7931 Secondary malignant neoplasm of brain: Secondary | ICD-10-CM

## 2021-11-19 DIAGNOSIS — R0981 Nasal congestion: Secondary | ICD-10-CM

## 2021-11-19 DIAGNOSIS — R630 Anorexia: Secondary | ICD-10-CM | POA: Diagnosis not present

## 2021-11-19 NOTE — Progress Notes (Signed)
? ? ?Manufacturing engineer ?Community Palliative Care Consult Note ?Telephone: [phone]  ?Fax: [phone]  ? ? ?Date of encounter: 11/19/21 ?6:06 PM ?PATIENT NAME: Darius [place] ?[place] [address]   ?[id number] (home)  ?DOB: 1946-07-17 ?MRN: [id number] ?PRIMARY CARE PROVIDER:    ?[name], MD,  ?[address] ?[place] [address] ?[phone] ? ?RESPONSIBLE PARTY:    ?Contact Information   ? ? Name Relation Home Work Mobile  ? Darius Smith, Darius Smith Spouse [phone]  [phone]  ? ?  ? ?Due to the COVID-19 crisis, this visit was done via telemedicine from my office and it was initiated and consent by this patient and or family. ? ?I connected with Darius Smith with  Darius Smith OR PROXY on 11/19/21 by telephone as video not available enabled telemedicine application and verified that I am speaking with the correct person using two identifiers. ?  ?I discussed the limitations of evaluation and management by telemedicine. The patient expressed understanding and agreed to proceed. Palliative Care was asked to follow this patient by consultation request of  [name], MD to address advance care planning and complex medical decision making. This is a follow up visit.                                  ?ASSESSMENT AND PLAN / RECOMMENDATIONS: ?Symptom Management/Plan: ?1. Advance Care Planning; DNR in vynca ?  ?2. Shortness of breath secondary to CHF, stable, continue to monitor weights; edema; continue with cardiology; stable echo repeated from 20 to 25% to 50%.  ?  ?3. 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 Monitor for side effects, manage symptoms currently asymptomatic; discussed nutrition ?09/02/2022 wegiht 180 lbs ?11/10/2021 weight 180 lbs ? BMI 24.41 ?4. Palliative care encounter; Palliative care encounter; Palliative medicine team will continue to support patient, patient's family, and medical team.  Visit consisted of counseling and education dealing with the complex and emotionally intense issues of symptom management and palliative care in the setting of serious and potentially life-threatening illness ? ?Follow up Palliative Care Visit: Palliative care will continue to follow for complex medical decision making, advance care planning, and clarification of goals. Return 8 weeks or prn. ? ?I spent 42 minutes providing this consultation. More than 50% of the time in this consultation was spent in counseling and care coordination. ?PPS: 50% ? ?Chief Complaint: Follow up palliative consult for complex medical decision making ? ?HISTORY OF PRESENT ILLNESS:  Darius Smith is a 76 y.o. year old male  with multiple medical problems including Cardiomegaly with ef 20 to 25%, atrial fibrillation, lymphoma, hypertension, chronic systolic congestive heart failure, gerd, chronic kidney disease, history of kidney stone, arthritis, cholecystectomy, history of cardioversion. Last Oncology visit with 10/21/2021 with Annice Needy, PA treatment with rituximab-lenalidomide on 07/08/2021; completed 3 cycles of lenalidomide at 10 mg daily for 21/28 days and 4 weekly doses of rituximab with C1, now every 8 weeks. Last 5 days of C3 revlimid were held due to covid. Symptoms improved significantly. Plan to resume therapy with C4. He is due for Rituxan today; C5 will begin 3/1. Scans as scheduled on 3/15 which includes PET/CT and MRI brain as well as visit with Dr. Shaaron Adler, due for Rituxan on 12/16/21.  11/10/2021 Last Cardiology visit with Dr Harl Bowie for CHF, with last echo LVEF as normalized; afib stable.  I called Darius Smith for telemedicine telephonic as video not available for [facility] visit. We talked about purpose of visit. Darius Smith in agreement. We talked about how Darius Smith has been feeling. We talked about recent hospitalizations, lymphoma with metastasis and continuing  chemotherapy without side effects. Darius Smith endorses continue to  receive oral chemotherapy. We talked about realistic expectations taking one day at a time. Currently Mr and Darius Smith wishes to continue treatment until quality of life is impaired. Goal is quality of life. We talked about further plan, MD appointments coming up. Reviewed medical decisions. We talked about Darius Smith functional abilities with continued weakness, trying to ambulate. We talked about resting periods. We talked about ambulating with walker. We talked about no recent falls. We talked about adl's. We talked about importance of comfort. We talked about role pc in poc. Darius. Smith in agreement. Questions answered. Therapeutic listening, emotional support provided.  .  ? ?History obtained from review of EMR, discussion with Darius Smith with Darius Smith.  ?I reviewed available labs, medications, imaging, studies and related documents from the EMR.  Records reviewed and summarized above.  ? ?ROS ?10 point system reviewed with Darius Smith with Mr Smith all negative except HPI ? ?Physical Exam: ?deferred ?Thank you for the opportunity to participate in the care of Darius Smith.  The palliative care team will continue to follow. Please call our office at [phone] if we can be of additional assistance.  ? ?Dawnisha Marquina Z Darius Weltz, NP  ? ? ?

## 2021-12-02 DIAGNOSIS — C8290 Follicular lymphoma, unspecified, unspecified site: Secondary | ICD-10-CM | POA: Diagnosis not present

## 2021-12-02 DIAGNOSIS — Z888 Allergy status to other drugs, medicaments and biological substances status: Secondary | ICD-10-CM | POA: Diagnosis not present

## 2021-12-02 DIAGNOSIS — C8589 Other specified types of non-Hodgkin lymphoma, extranodal and solid organ sites: Secondary | ICD-10-CM | POA: Diagnosis not present

## 2021-12-02 DIAGNOSIS — C8339 Diffuse large B-cell lymphoma, extranodal and solid organ sites: Secondary | ICD-10-CM | POA: Diagnosis not present

## 2021-12-02 DIAGNOSIS — Z7961 Long term (current) use of immunomodulator: Secondary | ICD-10-CM | POA: Diagnosis not present

## 2021-12-02 DIAGNOSIS — Z79899 Other long term (current) drug therapy: Secondary | ICD-10-CM | POA: Diagnosis not present

## 2021-12-02 DIAGNOSIS — R944 Abnormal results of kidney function studies: Secondary | ICD-10-CM | POA: Diagnosis not present

## 2021-12-02 DIAGNOSIS — Z880 Allergy status to penicillin: Secondary | ICD-10-CM | POA: Diagnosis not present

## 2021-12-02 DIAGNOSIS — C8510 Unspecified B-cell lymphoma, unspecified site: Secondary | ICD-10-CM | POA: Diagnosis not present

## 2021-12-03 ENCOUNTER — Encounter: Payer: Self-pay | Admitting: Internal Medicine

## 2021-12-07 DIAGNOSIS — H906 Mixed conductive and sensorineural hearing loss, bilateral: Secondary | ICD-10-CM | POA: Diagnosis not present

## 2021-12-07 DIAGNOSIS — Z8616 Personal history of COVID-19: Secondary | ICD-10-CM | POA: Diagnosis not present

## 2021-12-10 ENCOUNTER — Ambulatory Visit (INDEPENDENT_AMBULATORY_CARE_PROVIDER_SITE_OTHER): Payer: Medicare Other | Admitting: Pharmacist

## 2021-12-10 DIAGNOSIS — C7931 Secondary malignant neoplasm of brain: Secondary | ICD-10-CM

## 2021-12-10 DIAGNOSIS — N1832 Chronic kidney disease, stage 3b: Secondary | ICD-10-CM

## 2021-12-10 DIAGNOSIS — E119 Type 2 diabetes mellitus without complications: Secondary | ICD-10-CM

## 2021-12-10 DIAGNOSIS — I10 Essential (primary) hypertension: Secondary | ICD-10-CM

## 2021-12-10 DIAGNOSIS — I4891 Unspecified atrial fibrillation: Secondary | ICD-10-CM

## 2021-12-10 DIAGNOSIS — I5022 Chronic systolic (congestive) heart failure: Secondary | ICD-10-CM

## 2021-12-10 NOTE — Chronic Care Management (AMB) (Signed)
? ? ?Chronic Care Management ?Pharmacy Note ? ?12/10/2021 ?Name:  Darius Smith MRN:  [id number] DOB:  Jan 22, 1946 ? ?Summary: ?Steroid Induced Hyperglycemia ?Patient is intermittently receiving dexamethasone due to cancer treatment ?Controlled; Most recent A1c at goal of <7% per ADA guidelines ?Current medications: insulin lispro (Humalog) sliding scale insulin 1-10 units four times daily (with meals and at bedtime) ?Patient reports only using insulin if blood glucose >180 per instructions from Dr. Moshe Cipro and this has worked well. Patient only needs to use insulin for 1-2 days after dexamethasone infusion. ?Patient denies recent hypoglycemia ?Uses Freestyle Libre to monitor blood glucose. Patient had question today regarding why low alert alarms seem to go off at night while he is asleep but upon checking finger stick blood glucose, it is not a true hypoglycemic event. Discussed displacement of interstitial fluid and how this may explain the phenomenon that he is experiencing.  ?Continue sliding scale insulin if blood glucose >180. ?Continue to use Freestyle Libre to monitor blood glucose a few times per day. Check finger stick blood glucose if symptoms do not match sensory glucose reading.  ? ?Heart failure with improved ejection fraction ?Current treatment: metoprolol succinate 12.5 mg by mouth once daily, hydralazine 100 mg by mouth twice daily, isosorbide mononitrate 15 by mouth daily, and furosemide 20 mg by mouth daily as needed + potassium repletion 20 mEq once daily  ?Most recent echocardiogram: LVEF 50% (11/26/20) which was improved from LVEF 20-25% (06/12/20) ? ?Atrial Fibrillation ?Continue metoprolol succinate 12.5 mg by mouth once daily and amiodarone 200 mg by mouth once daily ?Continue apixaban (Eliquis) 5 mg by mouth twice daily  ? 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  ?Currently on Revlimid ?Continue current  management and follow-up with oncology provider ? ?Subjective: ?Darius Smith is an 76 y.o. year old male who is a primary patient of [name], MD.  The CCM team was consulted for assistance with disease management and care coordination needs.   ? ?Engaged with patient and wife [name]) by telephone for follow up visit in response to provider referral for pharmacy case management and/or care coordination services.  ? ?Consent to Services:  ?The patient was given information about Chronic Care Management services, agreed to services, and gave verbal consent prior to initiation of services.  Please see initial visit note for detailed documentation.  ? ?Patient Care Team: ?[name], MD as PCP - General (Internal Medicine) ?Arnoldo Lenis, MD as PCP - Cardiology (Cardiology) ?Brien Mates, RN as Oncology Nurse Navigator (Oncology) ?Kassie Mends, RN as [place] Management ?Beryle Lathe, [facility] (Pharmacist) ? ?Objective: ? ?Lab Results  ?Component Value Date  ? CREATININE 1.44 (H) 09/27/2021  ? CREATININE 1.65 (H) 08/10/2021  ? CREATININE 1.43 (H) 06/10/2021  ? ? ?Lab Results  ?Component Value Date  ? HGBA1C 6.5 (H) 06/01/2021  ? ?Last diabetic Eye exam:  ?Lab Results  ?Component Value Date/Time  ? HMDIABEYEEXA No Retinopathy 10/19/2021 12:00 AM  ?  ?Last diabetic Foot exam: No results found for: HMDIABFOOTEX  ? ?   ?Component Value Date/Time  ? CHOL 177 08/10/2021 0833  ? TRIG 108 08/10/2021 0833  ? HDL 50 08/10/2021 0833  ? CHOLHDL 2.9 07/31/2020 1131  ? [place] 107 (H) 08/10/2021 0833  ? ? ? ?  Latest Ref Rng & Units 08/10/2021  ?  8:33 AM 06/10/2021  ?  1:27 PM  06/02/2021  ?  4:42 AM  ?Hepatic Function  ?Total Protein 6.0 - 8.5 g/dL 5.9   6.1   5.5    ?Albumin 3.7 - 4.7 g/dL 3.8   2.9   2.7    ?AST 0 - 40 IU/L '11   23   19    ' ?ALT 0 - 44 IU/L 17   36   28    ?Alk Phosphatase 44 - 121 IU/L 67   50   40    ?Total Bilirubin 0.0 - 1.2 mg/dL 0.4   0.7   1.2    ? ? ?Lab  Results  ?Component Value Date/Time  ? TSH 1.441 06/18/2021 09:27 AM  ? TSH 1.156 08/01/2020 04:08 AM  ? ? ? ?  Latest Ref Rng & Units 09/27/2021  ? 12:57 AM 08/10/2021  ?  8:33 AM 06/10/2021  ?  1:27 PM  ?CBC  ?WBC 4.0 - 10.5 K/uL 4.9   7.8   14.4    ?Hemoglobin 13.0 - 17.0 g/dL 11.7   12.4   12.9    ?Hematocrit 39.0 - 52.0 % 37.0   38.3   37.4    ?Platelets 150 - 400 K/uL 112   261   167    ? ? ?No results found for: VD25OH ? ?Clinical ASCVD: No  ?The 10-year ASCVD risk score (Arnett DK, et al., 2019) is: 41.8% ?  Values used to calculate the score: ?    Age: 60 years ?    Sex: Male ?    Is Non-Hispanic African American: No ?    Diabetic: Yes ?    Tobacco smoker: No ?    Systolic Blood Pressure: 924 mmHg ?    Is BP treated: Yes ?    HDL Cholesterol: 50 mg/dL ?    Total Cholesterol: 177 mg/dL   ? ? ? ?Social History  ? ?Tobacco Use  ?Smoking Status Never  ?Smokeless Tobacco Never  ? ?BP Readings from Last 3 Encounters:  ?11/10/21 116/64  ?09/27/21 121/73  ?09/16/21 124/62  ? ?Pulse Readings from Last 3 Encounters:  ?11/10/21 (!) 56  ?09/27/21 (!) 52  ?09/16/21 (!) 53  ? ?Wt Readings from Last 3 Encounters:  ?11/10/21 180 lb 12.8 oz (82 kg)  ?09/26/21 180 lb 12.4 oz (82 kg)  ?09/16/21 182 lb 1.3 oz (82.6 kg)  ? ? ?Assessment: Review of patient past medical history, allergies, medications, health status, including review of consultants reports, laboratory and other test data, was performed as part of comprehensive evaluation and provision of chronic care management services.  ? ?SDOH:  (Social Determinants of Health) assessments and interventions performed:  ? ? ?CCM Care Plan ? ?Allergies  ?Allergen Reactions  ? Flomax [Tamsulosin Hcl] Nausea Only  ?  Dizziness  Pt is on flomax  ? Heparin Other (See Comments)  ?  Tingling in face and shortness of breath  ? ? ?Medications Reviewed Today   ? ? Reviewed by Beryle Lathe, [facility] (Pharmacist) on 12/10/21 at 1026  Med List Status: <None>  ? ?Medication Order Taking?  Sig Documenting Provider Last Dose Status Informant  ?acetaminophen (TYLENOL) 500 MG tablet [id number] Yes Take 1,000 mg by mouth every 6 (six) hours as needed for moderate pain or headache. [provider] Taking Active Spouse/Significant Other  ?amiodarone (PACERONE) 200 MG tablet [id number] Yes Take 1 tablet (200 mg total) by mouth daily. Fenton, Chapin R, PA Taking Active Spouse/Significant Other  ?amLODipine (NORVASC) 10 MG tablet [id number]  Yes TAKE 1 TABLET BY MOUTH ONCE DAILY. Arnoldo Lenis, MD Taking Active   ?apixaban (ELIQUIS) 5 MG TABS tablet [id number] Yes TAKE ONE TABLET BY MOUTH 2 TIMES A DAY. Arnoldo Lenis, MD Taking Active   ?blood glucose meter kit and supplies KIT [id number] Yes Inject 1 each into the skin 4 (four) times daily -  before meals and at bedtime. Dispense based on patient and insurance preference. Use up to four times daily as directed. Barb Merino, MD Taking Active   ?cetirizine (ZYRTEC) 10 MG tablet [id number] Yes Take 1 tablet (10 mg total) by mouth daily as needed for allergies. Corum, Rex Kras, MD Taking Active   ?Continuous Blood Gluc Receiver (FREESTYLE LIBRE 2 READER) DEVI [id number] Yes Use to monitor blood glucose continuously [provider] Taking Active   ?Continuous Blood Gluc Sensor (FREESTYLE LIBRE 2 SENSOR) MISC [id number] Yes USE TO MONITOR BLOOD SUGAR DAILY. (CHANGE EVERY 2WEEKS) [name], MD Taking Active   ?furosemide (LASIX) 20 MG tablet [id number] Yes Take 1 tablet (20 mg total) by mouth every other day. (MAY TAKE DAILY AS NEEDED FOR INCREASED SWELLING) Branch, Alphonse Guild, MD Taking Active Spouse/Significant Other  ?glucose blood (ONETOUCH ULTRA) test strip [id number] Yes 1 each by Other route 4 (four) times daily. [name], MD Taking Active   ?hydrALAZINE (APRESOLINE) 100 MG tablet [id number] Yes Take 1 tablet (100 mg total) by mouth 2 (two) times daily. Arnoldo Lenis, MD Taking Active   ?insulin lispro (HUMALOG KWIKPEN) 100  UNIT/ML KwikPen [id number] Yes Inject 1-10 Units into the skin 4 (four) times daily -  before meals and at bedtime. Blood sugars 70-120: 1 unit 121-150: 2 units 151-200: 3 units 201-250: 4 units 251-300: 5 units 30

## 2021-12-10 NOTE — Patient Instructions (Signed)
Darius Smith, ? ?It was great to talk to you today! ? ?Please call me with any questions or concerns. ? ?Visit Information ? ?Following are the goals we discussed today:  ? Goals Addressed   ? ?  ?  ?  ?  ? This Visit's Progress  ?  Medication Management     ?  Patient Goals/Self-Care Activities ?Over the next 90 days, patient will:  ?Take medications as prescribed ?Check blood sugar continuously with continuous glucose monitor, document, and provide at future appointments ?Check blood pressure at least once daily, document, and provide at future appointments ?Weigh daily, and contact provider if weight gain of more than 3 lbs in 1 day or more than 5 lbs in 1 week ? ? ?  ? ?  ?  ? ?Follow-up plan: Next PCP appointment scheduled for: 03/17/22 ? ?Patient verbalizes understanding of instructions and care plan provided today and agrees to view in [place]. Active MyChart status confirmed with patient.   ? ?Please call the care guide team at [phone] if you need to cancel or reschedule your appointment.  ? ?[name], PharmD, BCACP, CPP ?Clinical Pharmacist Practitioner ?[place] ?[phone]  ?

## 2021-12-14 ENCOUNTER — Other Ambulatory Visit: Payer: Self-pay | Admitting: Internal Medicine

## 2021-12-15 ENCOUNTER — Ambulatory Visit: Payer: Medicare Other | Admitting: *Deleted

## 2021-12-15 DIAGNOSIS — C8258 Diffuse follicle center lymphoma, lymph nodes of multiple sites: Secondary | ICD-10-CM

## 2021-12-15 DIAGNOSIS — I5022 Chronic systolic (congestive) heart failure: Secondary | ICD-10-CM

## 2021-12-15 NOTE — Chronic Care Management (AMB) (Signed)
?Chronic Care Management  ? ?CCM RN Visit Note ? ?12/15/2021 ?Name: Darius Smith MRN: [id number] DOB: 1946/02/10 ? ?Subjective: ?Darius Smith is a 76 y.o. year old male who is a primary care patient of [name], MD. The care management team was consulted for assistance with disease management and care coordination needs.   ? ?Engaged with patient by telephone for follow up visit in response to provider referral for case management and/or care coordination services.  ? ?Consent to Services:  ?The patient was given information about Chronic Care Management services, agreed to services, and gave verbal consent prior to initiation of services.  Please see initial visit note for detailed documentation.  ? ?Patient agreed to services and verbal consent obtained.  ? ?Assessment: Review of patient past medical history, allergies, medications, health status, including review of consultants reports, laboratory and other test data, was performed as part of comprehensive evaluation and provision of chronic care management services.  ? ?SDOH (Social Determinants of Health) assessments and interventions performed:   ? ?CCM Care Plan ? ?Allergies  ?Allergen Reactions  ? Flomax [Tamsulosin Hcl] Nausea Only  ?  Dizziness  Pt is on flomax  ? Heparin Other (See Comments)  ?  Tingling in face and shortness of breath  ? ? ?Outpatient Encounter Medications as of 12/15/2021  ?Medication Sig Note  ? acetaminophen (TYLENOL) 500 MG tablet Take 1,000 mg by mouth every 6 (six) hours as needed for moderate pain or headache.   ? amiodarone (PACERONE) 200 MG tablet Take 1 tablet (200 mg total) by mouth daily.   ? amLODipine (NORVASC) 10 MG tablet TAKE 1 TABLET BY MOUTH ONCE DAILY.   ? apixaban (ELIQUIS) 5 MG TABS tablet TAKE ONE TABLET BY MOUTH 2 TIMES A DAY.   ? blood glucose meter kit and supplies KIT Inject 1 each into the skin 4 (four) times daily -  before meals and at bedtime. Dispense based on patient and insurance preference. Use up  to four times daily as directed.   ? cetirizine (ZYRTEC) 10 MG tablet Take 1 tablet (10 mg total) by mouth daily as needed for allergies.   ? Continuous Blood Gluc Receiver (FREESTYLE LIBRE 2 READER) DEVI Use to monitor blood glucose continuously   ? Continuous Blood Gluc Sensor (FREESTYLE LIBRE 2 SENSOR) MISC USE TO MONITOR BLOOD SUGAR DAILY. (CHANGE EVERY 2WEEKS)   ? furosemide (LASIX) 20 MG tablet Take 1 tablet (20 mg total) by mouth every other day. (MAY TAKE DAILY AS NEEDED FOR INCREASED SWELLING)   ? glucose blood (ONETOUCH ULTRA) test strip 1 each by Other route 4 (four) times daily.   ? hydrALAZINE (APRESOLINE) 100 MG tablet Take 1 tablet (100 mg total) by mouth 2 (two) times daily.   ? insulin lispro (HUMALOG KWIKPEN) 100 UNIT/ML KwikPen Inject 1-10 Units into the skin 4 (four) times daily -  before meals and at bedtime. Blood sugars 70-120: 1 unit 121-150: 2 units 151-200: 3 units 201-250: 4 units 251-300: 5 units 301-350: 6 units 351-400: 9 units More than 400, 10 units and call your doctor 07/16/2021: Patient only using if blood glucose >180 per instructions from Dr. Moshe Cipro  ? Insulin Pen Needle 29G X 12MM MISC 1 each by Does not apply route 4 (four) times daily -  before meals and at bedtime.   ? isosorbide mononitrate (IMDUR) 30 MG 24 hr tablet TAKE 1/2 TABLET BY MOUTH ONCE DAILY.   ? Lancets (ONETOUCH DELICA PLUS [id number]) MISC 1  each by Other route 4 (four) times daily.   ? lenalidomide (REVLIMID) 10 MG capsule Take 10 mg by mouth. Take 10 mg on days 1-21 of a 28 day cycle.   ? metoprolol succinate (TOPROL XL) 25 MG 24 hr tablet Take 0.5 tablets (12.5 mg total) by mouth daily.   ? omeprazole (PRILOSEC) 20 MG capsule Take 1 capsule (20 mg total) by mouth daily as needed (reflux).   ? potassium chloride SA (KLOR-CON M20) 20 MEQ tablet Take 1 tablet (20 mEq total) by mouth daily.   ? VENTOLIN HFA 108 (90 Base) MCG/ACT inhaler Inhale 2 puffs into the lungs every 6 (six) hours as needed for wheezing or  shortness of breath.   ? ?No facility-administered encounter medications on file as of 12/15/2021.  ? ? ?Patient Active Problem List  ? Diagnosis Date Noted  ? CHF (congestive heart failure) ([place]) 06/24/2021  ? Brain metastases ([place])   ? Hyperkalemia 06/01/2021  ? Secondary hypercoagulable state ([place]) 08/27/2020  ? Palliative care by specialist   ? DNR (do not resuscitate) discussion   ? Encounter for support and coordination of transition of care 07/31/2020  ? Stage 3b chronic kidney disease ([place]) 07/31/2020  ? Chronic systolic HF (heart failure) ([place]) 07/14/2020  ? Follicular lymphoma ([place]) 06/11/2020  ? Atrial fibrillation ([place]) 03/20/2020  ? Essential hypertension 08/08/2014  ? GERD (gastroesophageal reflux disease) 08/08/2014  ? ? ?Conditions to be addressed/monitored:CHF and Cancer ? ?Care Plan : RN Care Manager plan of care  ?Updates made by Darius Mends, RN since 12/15/2021 12:00 AM  ?  ? ?Problem: No plan of care established for management of chronic disease states (CHF, cancer, A-fib, HTN)   ?Priority: High  ?  ? ?Long-Range Goal: Development of plan of care for chronic disease management (CHF, cancer, A-fib, HTN)   ?Start Date: 08/04/2021  ?Expected End Date: 06/13/2022  ?Priority: High  ?Note:   ?Current Barriers:  ?Knowledge Deficits related to plan of care for management of CHF and Cancer (follicular lymphoma)  ?Patient lives with wife Darius Smith and she provides oversight for all of patient's care, they have adult children.  Darius Smith reports pt requires assistance with bathing, walking, cooking, etc, has needed DME in the home and uses walker and cane for ambulation.  Patient sees oncologist at [facility] and had MRI to further assess plan of care for cancer, started chemotherapy infusions and had 4 treatments total in October and November and will restart 09/30/21 and continues Revlimid oral med 21 days on and 7 days off, per spouse, pt then had brain MRI and full body PET scan on 08/2021 and states there is  "improvement in different parts of his body". Update- pt had MRI and PET scan 12/02/21 and cancer is in remission per spouse, will be continuing q 8 weeks and MRI/ PET scan scheduled for q 12 weeks for 18 months.  Pt is eating well, has all medications and taking as prescribed, denies pain and nausea. Patient does try to walk through the house to stay active, is sleeping well, did receive flu vaccine.  Wife is very supportive and very involved with patient's care. Patient does weigh daily and record, weight today 175 pounds.  ?Patient now has [employer] (when receiving steroids with chemotherapy increases blood sugar) spouse reports fasting readings 81-93 ranges. ?Patient went to ED 09/26/21 with Covid and is overall better now except for a feeling of "water and popping in ears and can't hardly hear anything"  Spouse  reports PA at cancer center at [facility] assessed ears and saw buildup of wax and patient used Milta Deiters Med drops but this did not help, spouse ask if pt should see ENT or report to primary care provider. ?Per spouse, Debrox drops did not help with hearing and feeling of water in the ears, pt saw audiologist at [facility] and had hearing tests completed and hearing loss confirmed and referred to the "medical side for hearing issues and may have tubes placed in ear because there is fluid in the ear", left ear is affected ?RNCM Clinical Goal(s):  ?Patient/ spouse will verbalize understanding of disease states CHF, Cancer as evidenced by pt/ spouse report, review of EHR,  through collaboration with RN Care manager, provider, and care team.  ?Patient will take all medications as prescribed as evidenced by pt/ spouse report, review of EHR and collaboration with CCM pharmacist. ? ?Interventions: ?1:1 collaboration with primary care provider regarding development and update of comprehensive plan of care as evidenced by provider attestation and co-signature ?Inter-disciplinary care team collaboration (see longitudinal  plan of care) ?Evaluation of current treatment plan related to  self management and patient's adherence to plan as established by provider ? ?Heart Failure Interventions: ?Discussed importance of daily

## 2021-12-15 NOTE — Patient Instructions (Signed)
Visit Information ? ?Thank you for taking time to visit with me today. Please don't hesitate to contact me if I can be of assistance to you before our next scheduled telephone appointment. ? ?Following are the goals we discussed today:  ?Patient will self administer medications as prescribed as evidenced by self report/primary caregiver report  ?Patient will attend all scheduled provider appointments as evidenced by clinician review of documented attendance to scheduled appointments and patient/caregiver report ?Patient will call provider office for new concerns or questions as evidenced by review of documented incoming telephone call notes and patient report ?Continue to weigh daily and record ?Call your doctor for 3 pound weight gain overnight or 5 pound weight gain in one week ?Follow low sodium diet- read food labels, limit fast food ?Practice good handwashing and wear a mask as needed and avoid sick persons ?Alternate activity with rest, walk when you can ?Continue to work with palliative care and call them for questions or concerns ?Call RN care manager if you have questions at [phone] ?Continue to follow up with doctor about issues with hearing/ fluid in the ears ? ?Our next appointment is by telephone on 02/16/22 at 130 pm ? ?Please call the care guide team at [phone] if you need to cancel or reschedule your appointment.  ? ?If you are experiencing a Mental Health or [place] or need someone to talk to, please call the Suicide and Crisis Lifeline: 988 ?call the [place] National Suicide Prevention Lifeline: 707-033-5836 or TTY: (437) 348-7365 TTY 978-830-6174) to talk to a trained counselor ?call 1-800-273-TALK (toll free, 24 hour hotline) ?go to [facility] [address], [place] [phone]) ?call 911  ? ?Patient verbalizes understanding of instructions and care plan provided today and agrees to view in [place]. Active MyChart status confirmed  with patient.   ? ?Jacqlyn Larsen RNC, BSN ?RN Case Manager ?[place] ?[phone] ? ?

## 2021-12-16 DIAGNOSIS — Z5112 Encounter for antineoplastic immunotherapy: Secondary | ICD-10-CM | POA: Diagnosis not present

## 2021-12-16 DIAGNOSIS — C8510 Unspecified B-cell lymphoma, unspecified site: Secondary | ICD-10-CM | POA: Diagnosis not present

## 2021-12-16 DIAGNOSIS — C8290 Follicular lymphoma, unspecified, unspecified site: Secondary | ICD-10-CM | POA: Diagnosis not present

## 2021-12-16 NOTE — Progress Notes (Signed)
? ? ?Primary Care Physician: [name], MD ?Primary Cardiologist: Dr Harl Bowie ?Primary Electrophysiologist: none ?Referring Physician: Ermalinda Barrios ? ? ?Darius Smith is a 76 y.o. male with a history of chronic systolic CHF, HTN, DM, stage IV lymphoma, and persistent atrial fibrillation who presents for follow up in the [facility].  The patient was initially diagnosed with atrial fibrillation 2019 in the setting of a urology procedure. He was found to be back in afib on follow up 06/23/20 with a new cardiomyopathy, EF 20-25%. Patient is on Eliquis for a CHADS2VASC score of 3. He was admitted 07/14/20 for afib RVR with acute CHF. DCCV on 07/16/20 was unsuccessful and he was loaded on amiodarone. He underwent repeat DCCV on 08/21/20 which was initially successful but was back in afib on follow up12/6/21. Patient is mostly unaware of his afib with only mild palpitations. He denies any alcohol use. He has snored all his life 2/2 h/o cleft palate, no witnessed apnea or daytime somnolence. Patient is s/p DCCV on 09/10/20.  ? ?On follow up today, patient reports that he has done well since his last visit. He remains in SR. His cancer is now in remission. He is working on increasing his physical activity. No bleeding issues on anticoagulation.  ? ?Today, he denies symptoms of palpitations, chest pain, shortness of breath, orthopnea, PND, lower extremity edema, presyncope, syncope, daytime somnolence, bleeding, or neurologic sequela. The patient is tolerating medications without difficulties and is otherwise without complaint today.  ? ? ?Atrial Fibrillation Risk Factors: ? ?he does not have symptoms or diagnosis of sleep apnea. ?he does not have a history of rheumatic fever. ?he does not have a history of alcohol use. ?The patient does not have a history of early familial atrial fibrillation or other arrhythmias. ? ?he has a BMI of Body mass index is 24.71 kg/m?Marland KitchenMarland Kitchen ?Filed Weights  ? 12/17/21  0923  ?Weight: 82.6 kg  ? ? ? ? ?Family History  ?Problem Relation Age of Onset  ? Breast cancer Mother   ? Prostate cancer Father   ? COPD Sister   ? ? ? ?Atrial Fibrillation Management history: ? ?Previous antiarrhythmic drugs: amiodarone ?Previous cardioversions: 07/16/20, 08/21/20, 09/10/20 ?Previous ablations: none ?CHADS2VASC score: 3 ?Anticoagulation history: Eliquis ? ? ?Past Medical History:  ?Diagnosis Date  ? Allergic rhinitis 07/02/2019  ? Arthritis   ? both hands  ? Atrial fibrillation ([place]) 10/28/2017  ? Dr. Harl Bowie  ? Atrial fibrillation with RVR ([place]) 07/14/2020  ? Blood in urine   ? occ  ? Bronchitis   ? Cancer [facility])   ? Basal cell  ? Cardiomegaly 10/28/2017  ? CHF (congestive heart failure) ([place])   ? Complication of anesthesia   ? Mr. Calzadilla developed a. fib in recovery after cysto procedure 10/2017 was transferred to [facility] and underwent successful cardioversion  ? Diabetes ([place])   ? Diverticulosis of sigmoid colon 08/2017  ? Noted on colonoscopy  ? Dysrhythmia   ? External hemorrhoids 08/2017  ? GERD (gastroesophageal reflux disease)   ? History of colon polyps 08/2017  ? History of kidney stones   ? History of rectal bleeding   ? History of right inguinal hernia   ? Hypertension   ? Lung cancer ([place]) 06/25/2020  ? Lymphoma ([place])   ? Nephrolithiasis 07/02/2019  ? Rectal bleeding 09/02/2017  ? Added automatically from request for surgery [phone]  ? Shortness of breath   ? Stage 3 chronic kidney disease ([place])   ? ?  Past Surgical History:  ?Procedure Laterality Date  ? AXILLARY LYMPH NODE BIOPSY Left 06/05/2021  ? Procedure: AXILLARY LYMPH NODE BIOPSY;  Surgeon: Virl Cagey, MD;  Location: [facility] ORS;  Service: General;  Laterality: Left;  ? CARDIOVERSION    ? 10/2017  ? CARDIOVERSION N/A 07/16/2020  ? Procedure: CARDIOVERSION;  Surgeon: Arnoldo Lenis, MD;  Location: [facility] ENDO SUITE;  Service: Endoscopy;  Laterality: N/A;  ? CARDIOVERSION N/A 08/21/2020  ? Procedure: CARDIOVERSION;  Surgeon: Satira Sark, MD;  Location: [facility] ORS;  Service: Cardiovascular;  Laterality: N/A;  ? CARDIOVERSION N/A 09/10/2020  ? Procedure: CARDIOVERSION;  Surgeon: Josue Hector, MD;  Location: [facility] ENDOSCOPY;  Service: Cardiovascular;  Laterality: N/A;  ? CHOLECYSTECTOMY    ? CLEFT LIP REPAIR    ? several from childhood til 76 years old  ? CLEFT PALATE REPAIR    ? several from childhood til 76 years old  ? COLONOSCOPY  04/28/2011  ? Procedure: COLONOSCOPY;  Surgeon: Rogene Houston, MD;  Location: [facility] END[facility] SU[facility];  Service: Endoscopy;  Laterality: N/A;  ? COLONOSCOPY N/A 07/18/2014  ? Procedure: COLONOSCOPY;  Surgeon: Rogene Houston, MD;  Location: [facility] END[facility] SU[facility];  Service: Endoscopy;  Laterality: N/A;  830  ? COLONOSCOPY N/A 09/09/2017  ? Procedure: COLONOSCOPY;  Surgeon: Rogene Houston, MD;  Location: [facility] END[facility] SU[facility];  Service: Endoscopy;  Laterality: N/A;  955  ? CYSTOSCOPY/URETEROSCOPY/HOLMIUM LASER/STENT PLACEMENT Bilateral 12/06/2017  ? Procedure: FTDDUKGURK/YHCWCBJSEG/BTDVVOHYWVPX/TGGYIRS LASER/STENT EXCHANGE;  Surgeon: Festus Aloe, MD;  Location: [facility] ORS;  Service: Urology;  Laterality: Bilateral;  ONLY NEEDS 60 MIN  ? HERNIA REPAIR    ? LYMPH NODE BIOPSY Left   ? Under left arm  ? POLYPECTOMY  09/09/2017  ? Procedure: POLYPECTOMY;  Surgeon: Rogene Houston, MD;  Location: [facility] END[facility] SU[facility];  Service: Endoscopy;;  colon  ? vocal cord surgery    ? ? ?Current Outpatient Medications  ?Medication Sig Dispense Refill  ? acetaminophen (TYLENOL) 500 MG tablet Take 1,000 mg by mouth every 6 (six) hours as needed for moderate pain or headache.    ? amiodarone (PACERONE) 200 MG tablet Take 1 tablet (200 mg total) by mouth daily. 60 tablet 6  ? amLODipine (NORVASC) 10 MG tablet TAKE 1 TABLET BY MOUTH ONCE DAILY. 90 tablet 3  ? apixaban (ELIQUIS) 5 MG TABS tablet TAKE ONE TABLET BY MOUTH 2 TIMES A DAY. 60 tablet 5  ? blood glucose meter kit and supplies KIT Inject 1 each into the skin 4 (four) times daily -  before meals and at  bedtime. Dispense based on patient and insurance preference. Use up to four times daily as directed. 1 each 0  ? cetirizine (ZYRTEC) 10 MG tablet Take 1 tablet (10 mg total) by mouth daily as needed for allergies. 90 tablet 2  ? Continuous Blood Gluc Receiver (FREESTYLE LIBRE 2 READER) DEVI Use to monitor blood glucose continuously    ? Continuous Blood Gluc Sensor (FREESTYLE LIBRE 2 SENSOR) MISC USE TO MONITOR BLOOD SUGAR DAILY. (CHANGE EVERY 2WEEKS) 1 each 3  ? furosemide (LASIX) 20 MG tablet Take 1 tablet (20 mg total) by mouth every other day. (MAY TAKE DAILY AS NEEDED FOR INCREASED SWELLING)    ? glucose blood (ONETOUCH ULTRA) test strip 1 each by Other route 4 (four) times daily. 100 each 3  ? hydrALAZINE (APRESOLINE) 100 MG tablet Take 1 tablet (100 mg total) by mouth 2 (two) times daily. 180 tablet 3  ? insulin  lispro (HUMALOG KWIKPEN) 100 UNIT/ML KwikPen Inject 1-10 Units into the skin 4 (four) times daily -  before meals and at bedtime. Blood sugars 70-120: 1 unit 121-150: 2 units 151-200: 3 units 201-250: 4 units 251-300: 5 units 301-350: 6 units 351-400: 9 units More than 400, 10 units and call your doctor 15 mL 11  ? Insulin Pen Needle 29G X 12MM MISC 1 each by Does not apply route 4 (four) times daily -  before meals and at bedtime. 100 each 0  ? isosorbide mononitrate (IMDUR) 30 MG 24 hr tablet TAKE 1/2 TABLET BY MOUTH ONCE DAILY. 45 tablet 3  ? Lancets (ONETOUCH DELICA PLUS PXTGGY69S) MISC 1 each by Other route 4 (four) times daily.    ? lenalidomide (REVLIMID) 10 MG capsule Take 10 mg by mouth. Take 10 mg on days 1-21 of a 28 day cycle.    ? metoprolol succinate (TOPROL XL) 25 MG 24 hr tablet Take 0.5 tablets (12.5 mg total) by mouth daily. 45 tablet 3  ? omeprazole (PRILOSEC) 20 MG capsule Take 1 capsule (20 mg total) by mouth daily as needed (reflux). 90 capsule 1  ? potassium chloride SA (KLOR-CON M20) 20 MEQ tablet Take 1 tablet (20 mEq total) by mouth daily. 30 tablet 6  ? VENTOLIN HFA 108 (90  Base) MCG/ACT inhaler Inhale 2 puffs into the lungs every 6 (six) hours as needed for wheezing or shortness of breath. 18 g 3  ? ?No current facility-administered medications for this encounter.  ? ? ?Allerg

## 2021-12-17 ENCOUNTER — Ambulatory Visit (HOSPITAL_COMMUNITY)
Admission: RE | Admit: 2021-12-17 | Discharge: 2021-12-17 | Disposition: A | Discharge status: Home / Self Care | Payer: Medicare Other | Source: Ambulatory Visit | Attending: Physician Assistant | Admitting: Physician Assistant

## 2021-12-17 ENCOUNTER — Encounter (HOSPITAL_COMMUNITY): Payer: Self-pay | Admitting: Physician Assistant

## 2021-12-17 VITALS — BP 100/66 | HR 45 | Ht 72.0 in | Wt 182.2 lb

## 2021-12-17 DIAGNOSIS — E1122 Type 2 diabetes mellitus with diabetic chronic kidney disease: Secondary | ICD-10-CM | POA: Diagnosis not present

## 2021-12-17 DIAGNOSIS — I429 Cardiomyopathy, unspecified: Secondary | ICD-10-CM | POA: Diagnosis not present

## 2021-12-17 DIAGNOSIS — R9431 Abnormal electrocardiogram [ECG] [EKG]: Secondary | ICD-10-CM | POA: Insufficient documentation

## 2021-12-17 DIAGNOSIS — I5022 Chronic systolic (congestive) heart failure: Secondary | ICD-10-CM | POA: Insufficient documentation

## 2021-12-17 DIAGNOSIS — Z7901 Long term (current) use of anticoagulants: Secondary | ICD-10-CM | POA: Diagnosis not present

## 2021-12-17 DIAGNOSIS — Z79899 Other long term (current) drug therapy: Secondary | ICD-10-CM | POA: Diagnosis not present

## 2021-12-17 DIAGNOSIS — R001 Bradycardia, unspecified: Secondary | ICD-10-CM | POA: Insufficient documentation

## 2021-12-17 DIAGNOSIS — I130 Hypertensive heart and chronic kidney disease with heart failure and stage 1 through stage 4 chronic kidney disease, or unspecified chronic kidney disease: Secondary | ICD-10-CM | POA: Insufficient documentation

## 2021-12-17 DIAGNOSIS — N183 Chronic kidney disease, stage 3 unspecified: Secondary | ICD-10-CM | POA: Diagnosis not present

## 2021-12-17 DIAGNOSIS — D6869 Other thrombophilia: Secondary | ICD-10-CM

## 2021-12-17 DIAGNOSIS — Z8572 Personal history of non-Hodgkin lymphomas: Secondary | ICD-10-CM | POA: Insufficient documentation

## 2021-12-17 DIAGNOSIS — Z87730 Personal history of (corrected) cleft lip and palate: Secondary | ICD-10-CM | POA: Insufficient documentation

## 2021-12-17 DIAGNOSIS — I4819 Other persistent atrial fibrillation: Secondary | ICD-10-CM | POA: Diagnosis not present

## 2021-12-18 DIAGNOSIS — I5022 Chronic systolic (congestive) heart failure: Secondary | ICD-10-CM | POA: Diagnosis not present

## 2021-12-18 DIAGNOSIS — E119 Type 2 diabetes mellitus without complications: Secondary | ICD-10-CM | POA: Diagnosis not present

## 2021-12-18 DIAGNOSIS — I4891 Unspecified atrial fibrillation: Secondary | ICD-10-CM | POA: Diagnosis not present

## 2021-12-18 DIAGNOSIS — I10 Essential (primary) hypertension: Secondary | ICD-10-CM | POA: Diagnosis not present

## 2021-12-19 ENCOUNTER — Other Ambulatory Visit: Payer: Self-pay | Admitting: Cardiology

## 2021-12-21 NOTE — Telephone Encounter (Signed)
Prescription refill request for Eliquis received. ?Indication: Atrial Fib ?Last office visit: 12/17/21  Louretta Shorten PA ?Scr: 1.69 on 12/16/21 ?Age: 76 ?Weight: 82.6kg ? ?Based on above findings Eliquis 5mg  twice daily is the appropriate dose.  Refill approved. ? ?

## 2021-12-24 IMAGING — MR MR HEAD WO/W CM
14 of 16 series · 40 of 48 positions shown · IV contrast (Gadavist)
Comparison: None.
COMPARISON: None.

Addendum:
CLINICAL DATA: Hematologic malignancy, surveillance

EXAM:
MRI HEAD WITHOUT AND WITH CONTRAST
TECHNIQUE: Multiplanar, multiecho pulse sequences of the brain and surrounding
structures were obtained without and with intravenous contrast.
CONTRAST:  8.5mL GADAVIST GADOBUTROL 1 MMOL/ML IV SOLN

[Series 5: DWI · axial · 3.0mm · 0.88mm/px · z∈[-77,+61]mm · 6 of 96 slices shown (1 of 4)]
[im 1/96]
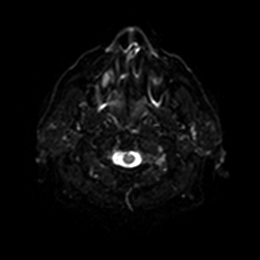
[im 20/96]
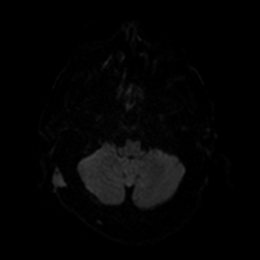
[im 39/96]
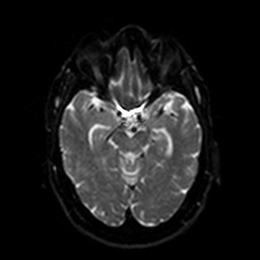
[im 58/96]
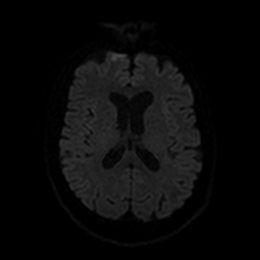
[im 77/96]
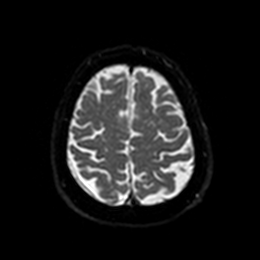
[im 96/96]
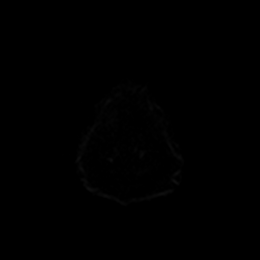

[Series 6: DWI · axial · 3.0mm · 0.88mm/px · z∈[-77,+61]mm · 3 of 47 slices shown (2 of 4)]
[im 1/47]
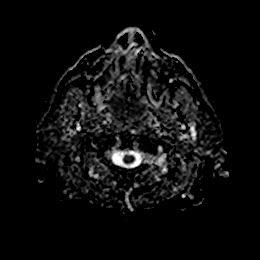
[im 24/47]
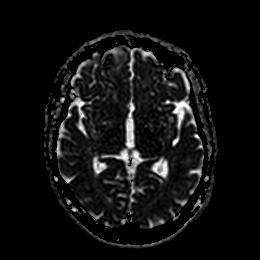
[im 47/47]
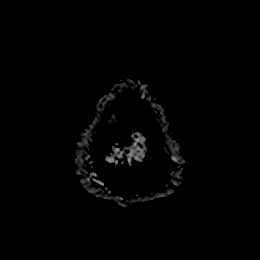

[Series 7: DWI · coronal · 4.0mm · 0.88mm/px · 4 of 64 slices shown (3 of 4)]
[im 1/64]
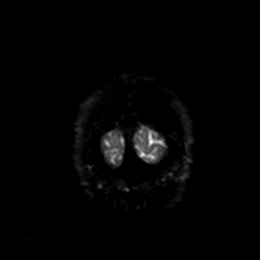
[im 22/64]
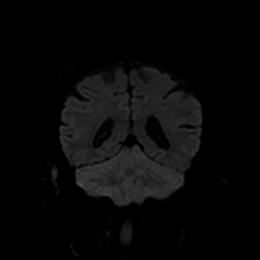
[im 43/64]
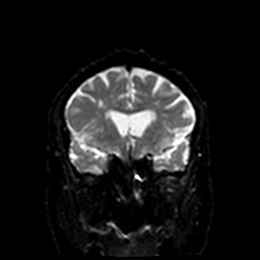
[im 64/64]
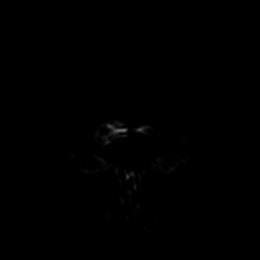

[Series 8: DWI · coronal · 4.0mm · 0.88mm/px · 2 of 32 slices shown (4 of 4)]
[im 1/32]
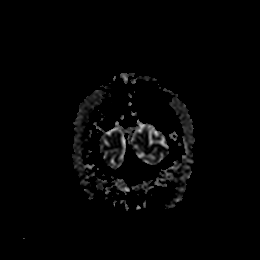
[im 32/32]
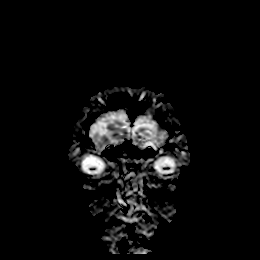

[Series 9: T1 · sagittal · 5.0mm · 0.75mm/px · 1 of 23 slices shown]
[im 1/23]
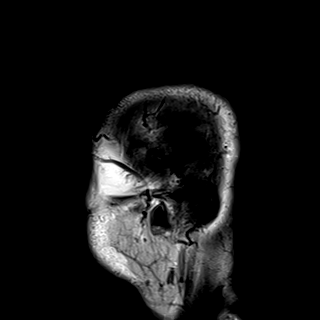

[Series 10: T2 · axial · 5.0mm · 0.72mm/px · z∈[-77,+64]mm · 2 of 25 slices shown]
[im 1/25]
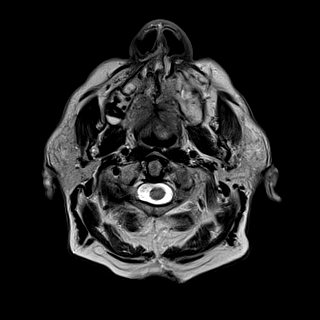
[im 25/25]
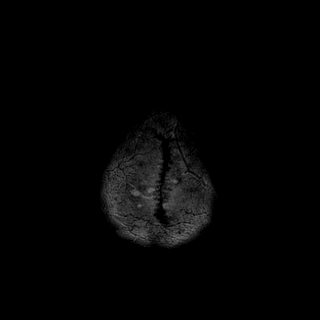

[Series 11: FLAIR · axial · 5.0mm · 0.45mm/px · z∈[-76,+65]mm · 2 of 25 slices shown]
[im 1/25]
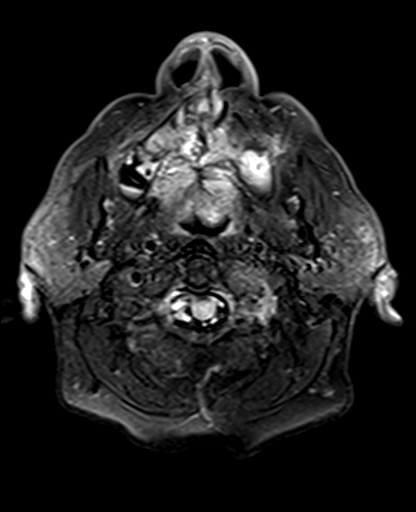
[im 25/25]
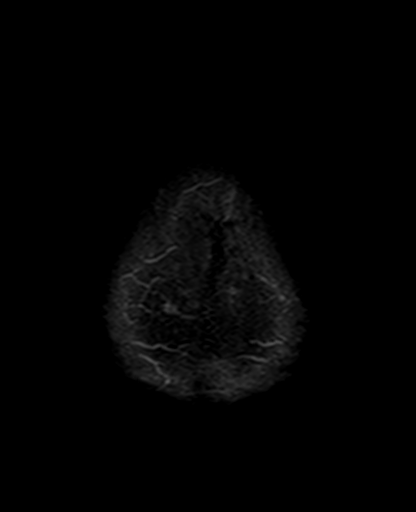

[Series 12: mag_images · axial · 3.0mm · 0.90mm/px · z∈[-92,+81]mm · 4 of 60 slices shown]
[im 1/60]
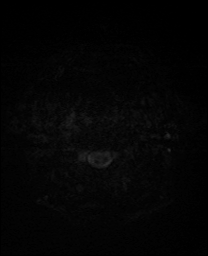
[im 20/60]
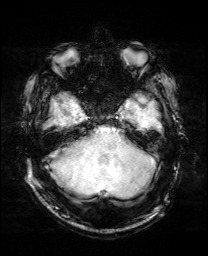
[im 40/60]
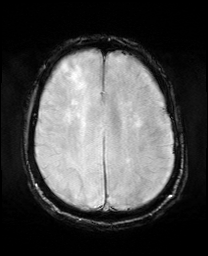
[im 60/60]
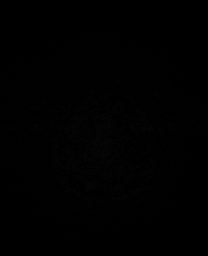

[Series 13: pha_images · axial · 3.0mm · 0.90mm/px · z∈[-87,+78]mm · 4 of 56 slices shown]
[im 1/56]
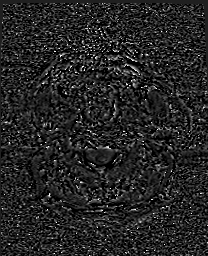
[im 19/56]
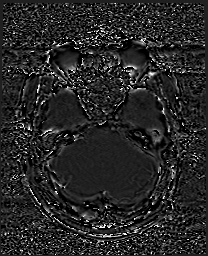
[im 37/56]
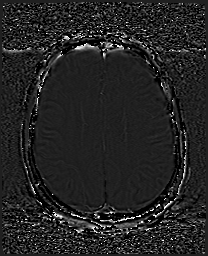
[im 56/56]
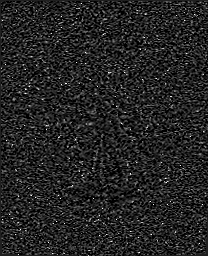

[Series 14: swi_images · axial · 3.0mm · 0.90mm/px · z∈[-92,+81]mm · 4 of 60 slices shown]
[im 1/60]
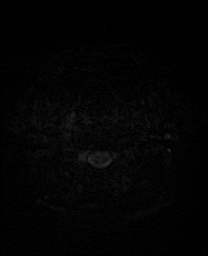
[im 20/60]
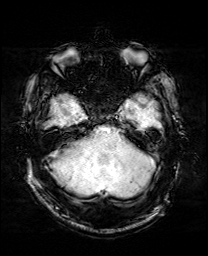
[im 40/60]
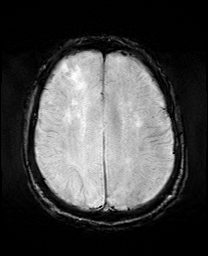
[im 60/60]
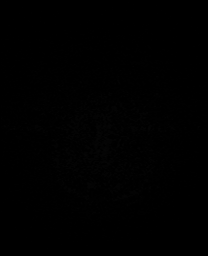

[Series 15: mip_images(sw) · axial · 24.0mm · 0.90mm/px · z∈[-82,+71]mm · 3 of 53 slices shown]
[im 1/53]
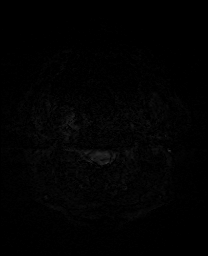
[im 27/53]
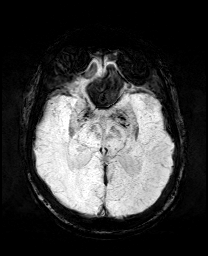
[im 53/53]
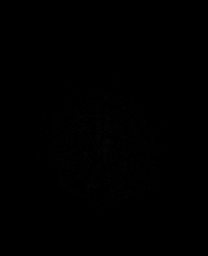

[Series 17: T2 post-contrast · coronal · 5.0mm · 0.72mm/px · 2 of 28 slices shown]
[im 1/28]
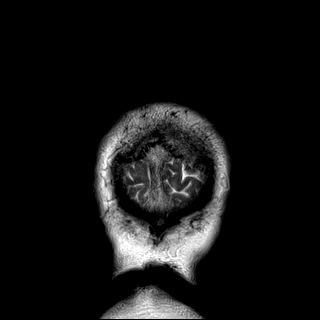
[im 28/28]
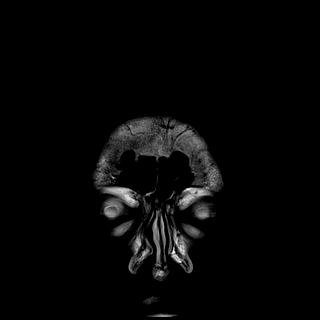

[Series 19: T1 post-contrast · coronal · 5.0mm · 0.34mm/px · 2 of 28 slices shown (1 of 2)]
[im 1/28]
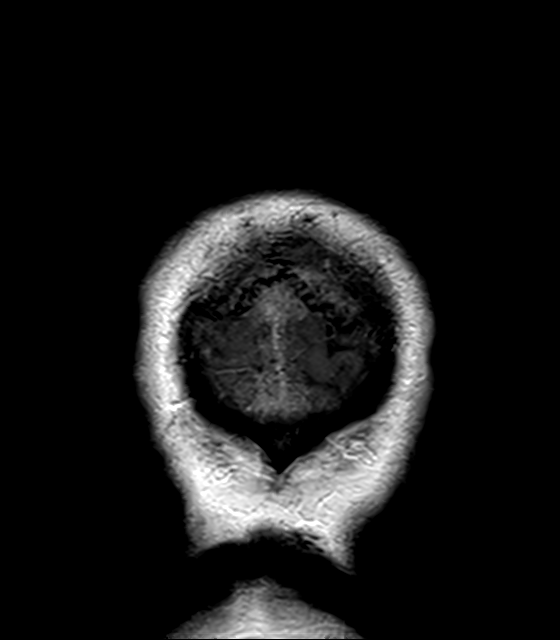
[im 28/28]
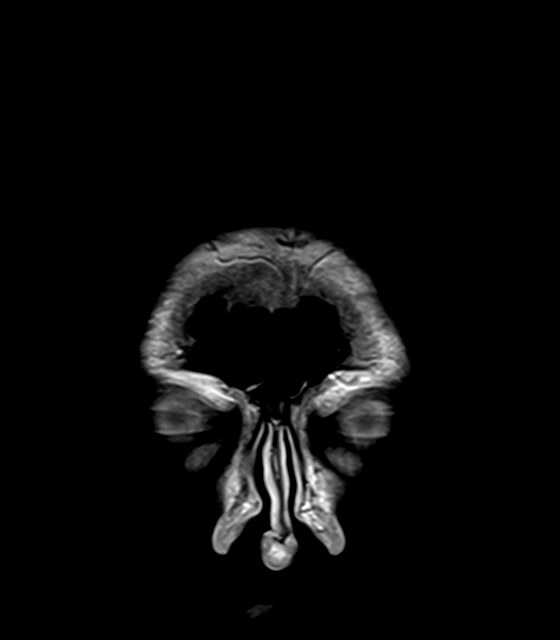

[Series 20: T1 post-contrast · sagittal · 5.0mm · 0.72mm/px · 1 of 23 slices shown (2 of 2)]
[im 1/23]
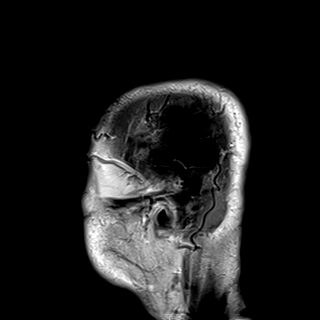

[40 of 48 positions shown; findings below may reference images not displayed]

FINDINGS: Brain: Several (approximately 5) enhancing lesions are identified
likely involving brain parenchyma. Larger posterior fossa lesion
measures 1.2 cm and is located within the midline cerebellar vermis
along the roof of the fourth ventricle. Largest supratentorial
lesion involves the anterior right frontal lobe and measures 1.1 cm.
There is associated edema without significant mass effect. Minimal
diffusion hyperintensity associated with right frontal lesions.

No hemorrhage. There is no hydrocephalus or extra-axial fluid
collection. Additional patchy foci of T2 hyperintensity in the
supratentorial white matter are nonspecific but may reflect chronic
microvascular ischemic changes.

Vascular: Major vessel flow voids at the skull base are preserved.

Skull and upper cervical spine: Normal marrow signal is preserved.

Sinuses/Orbits: Paranasal sinus mucosal thickening. Polypoid soft
tissue in posterior left nasal cavity extending into the
nasopharynx. Orbits are unremarkable.

Other: Right posterior auricular soft tissue likely reflects
adenopathy. Sella is unremarkable. Mastoid air cells are clear.
IMPRESSION: Several small enhancing lesions with edema but no significant mass
effect. Given history, secondary lymphoma is suspected. Likely
involving brain parenchyma but given peripheral location,
leptomeningeal involvement is not excluded.

These results will be called to the ordering clinician or
representative by the Radiologist Assistant, and communication
documented in the PACS or [REDACTED].

ADDENDUM:
Not included in the impression, there is right posterior auricular
soft tissue likely reflecting adenopathy.

*** End of Addendum ***
FINDINGS: Brain: Several (approximately 5) enhancing lesions are identified
likely involving brain parenchyma. Larger posterior fossa lesion
measures 1.2 cm and is located within the midline cerebellar vermis
along the roof of the fourth ventricle. Largest supratentorial
lesion involves the anterior right frontal lobe and measures 1.1 cm.
There is associated edema without significant mass effect. Minimal
diffusion hyperintensity associated with right frontal lesions.

No hemorrhage. There is no hydrocephalus or extra-axial fluid
collection. Additional patchy foci of T2 hyperintensity in the
supratentorial white matter are nonspecific but may reflect chronic
microvascular ischemic changes.

Vascular: Major vessel flow voids at the skull base are preserved.

Skull and upper cervical spine: Normal marrow signal is preserved.

Sinuses/Orbits: Paranasal sinus mucosal thickening. Polypoid soft
tissue in posterior left nasal cavity extending into the
nasopharynx. Orbits are unremarkable.

Other: Right posterior auricular soft tissue likely reflects
adenopathy. Sella is unremarkable. Mastoid air cells are clear.
IMPRESSION: Several small enhancing lesions with edema but no significant mass
effect. Given history, secondary lymphoma is suspected. Likely
involving brain parenchyma but given peripheral location,
leptomeningeal involvement is not excluded.

These results will be called to the ordering clinician or
representative by the Radiologist Assistant, and communication
documented in the PACS or [REDACTED].

## 2021-12-31 DIAGNOSIS — H6121 Impacted cerumen, right ear: Secondary | ICD-10-CM | POA: Diagnosis not present

## 2021-12-31 DIAGNOSIS — H906 Mixed conductive and sensorineural hearing loss, bilateral: Secondary | ICD-10-CM | POA: Diagnosis not present

## 2021-12-31 DIAGNOSIS — Z8616 Personal history of COVID-19: Secondary | ICD-10-CM | POA: Diagnosis not present

## 2021-12-31 DIAGNOSIS — H938X2 Other specified disorders of left ear: Secondary | ICD-10-CM | POA: Diagnosis not present

## 2021-12-31 DIAGNOSIS — Z87730 Personal history of (corrected) cleft lip and palate: Secondary | ICD-10-CM | POA: Diagnosis not present

## 2022-01-14 DIAGNOSIS — H906 Mixed conductive and sensorineural hearing loss, bilateral: Secondary | ICD-10-CM | POA: Diagnosis not present

## 2022-01-14 DIAGNOSIS — Z87730 Personal history of (corrected) cleft lip and palate: Secondary | ICD-10-CM | POA: Diagnosis not present

## 2022-01-14 DIAGNOSIS — H6121 Impacted cerumen, right ear: Secondary | ICD-10-CM | POA: Diagnosis not present

## 2022-01-14 DIAGNOSIS — H9193 Unspecified hearing loss, bilateral: Secondary | ICD-10-CM | POA: Diagnosis not present

## 2022-01-18 ENCOUNTER — Other Ambulatory Visit: Payer: Self-pay | Admitting: Cardiology

## 2022-01-21 ENCOUNTER — Other Ambulatory Visit: Payer: Self-pay | Admitting: *Deleted

## 2022-01-21 ENCOUNTER — Encounter: Payer: Self-pay | Admitting: Cardiology

## 2022-01-21 MED ORDER — FUROSEMIDE 20 MG PO TABS: 20.0000 mg | ORAL_TABLET | ORAL | Status: DC

## 2022-01-28 ENCOUNTER — Encounter: Payer: Self-pay | Admitting: Nurse Practitioner

## 2022-01-28 ENCOUNTER — Other Ambulatory Visit: Payer: Medicare Other | Admitting: Nurse Practitioner

## 2022-01-28 DIAGNOSIS — R0602 Shortness of breath: Secondary | ICD-10-CM

## 2022-01-28 DIAGNOSIS — R630 Anorexia: Secondary | ICD-10-CM | POA: Diagnosis not present

## 2022-01-28 DIAGNOSIS — Z515 Encounter for palliative care: Secondary | ICD-10-CM

## 2022-01-28 NOTE — Progress Notes (Signed)
? ? ?Manufacturing engineer ?Community Palliative Care Consult Note ?Telephone: [phone]  ?Fax: [phone]  ? ? ?Date of encounter: 01/28/22 ?2:05 PM ?PATIENT NAME: Darius Smith ?Darius Smith [id number]   ?[phone] (home)  ?DOB: [birth year] ?MRN: [id number] ?PRIMARY CARE PROVIDER:    ?Darius Spar, MD,  ?[address] ?[place] [address] ?[phone] ? ?RESPONSIBLE PARTY:    ?Contact Information   ? ? Name Relation Home Work Mobile  ? Darius Smith Spouse [phone]  [phone]  ? ?  ? ?Due to the COVID-19 crisis, this visit was done via telemedicine from my office and it was initiated and consent by this patient and or family. ? ?I connected with Darius Smith with Darius Smith OR PROXY on 01/28/22 by a telephone as video not available enabled telemedicine application and verified that I am speaking with the correct person using two identifiers. ?  ?I discussed the limitations of evaluation and management by telemedicine. The patient expressed understanding and agreed to proceed.  Palliative Care was asked to follow this patient by consultation request of  Darius Spar, MD to address advance care planning and complex medical decision making. This is a follow up visit.                                  ?ASSESSMENT AND PLAN / RECOMMENDATIONS:  ?Symptom Management/Plan: ?1. Advance Care Planning; DNR in vynca ?  ?2. Shortness of breath secondary to CHF, stable, continue to monitor weights; edema; continue with cardiology; stable echo repeated from 20 to 25% to 50%.  ?  ?3. Anorexia improved, secondary to lymphoma with brain mets currently in remission, continued chemotherapy  with diagnostic testing. Monitor for side effects, manage symptoms currently asymptomatic; discussed nutrition ?Last weight 12/16/2021 181 lbs with BMI 24.54 ? ?4. Palliative care encounter; Palliative care encounter; Palliative medicine team will continue to support patient, patient's family, and medical  team. Visit consisted of counseling and education dealing with the complex and emotionally intense issues of symptom management and palliative care in the setting of serious and potentially life-threatening illness ? ?Follow up Palliative Care Visit: Palliative care will continue to follow for complex medical decision making, advance care planning, and clarification of goals. Return 8 weeks or prn. ? ?I spent 37 minutes providing this consultation. More than 50% of the time in this consultation was spent in counseling and care coordination. ?PPS: 50% ? ?Chief Complaint: Follow up palliative consult for complex medical decision making ? ?HISTORY OF PRESENT ILLNESS:  NYGEL PROKOP is a 76 y.o. year old male  with multiple medical problems including Cardiomegaly with ef 20 to 25%, atrial fibrillation, lymphoma, hypertension, chronic systolic congestive heart failure, gerd, chronic kidney disease, history of kidney stone, arthritis, cholecystectomy, history of cardioversion. I called Darius Smith for telemedicine telephonic as video not available for [facility] visit. We talked about purpose of visit. Darius Smith in agreement. We talked about how Darius Smith has been feeling. Darius Smith endorses Mr Smith is doing fantastic, just got back on the tractor 2 weeks ago and has been working every day. Darius Smith endorses continue to receive oral chemotherapy with recent scans showing remission. We talked about ros, symptoms currently asymptomatic. No concerns or problems. Currently Mr and Darius Smith wishes to continue treatment until quality of life is impaired. Goal is quality of life. We talked about further plan, MD appointments  coming up. Reviewed medical decisions. We talked about importance of quality of life We talked about role pc in poc. Darius Smith in agreement. Questions answered. Therapeutic listening, emotional support provided. ? ?History obtained from review of EMR, discussion with Darius and  Darius Smith.  ?I reviewed available labs,  medications, imaging, studies and related documents from the EMR.  Records reviewed and summarized above.  ? ?ROS ?10 point system reviewed all negative except HPI ? ?Physical Exam: ?deferred ? ?Thank you for the opportunity to participate in the care of Darius Smith.  The palliative care team will continue to follow. Please call our office at [phone] if we can be of additional assistance.  ? ?Darius Bernet Z Maytal Mijangos, NP  ?   ?

## 2022-02-01 DIAGNOSIS — Z23 Encounter for immunization: Secondary | ICD-10-CM | POA: Diagnosis not present

## 2022-02-10 DIAGNOSIS — Z803 Family history of malignant neoplasm of breast: Secondary | ICD-10-CM | POA: Diagnosis not present

## 2022-02-10 DIAGNOSIS — Z8249 Family history of ischemic heart disease and other diseases of the circulatory system: Secondary | ICD-10-CM | POA: Diagnosis not present

## 2022-02-10 DIAGNOSIS — C8589 Other specified types of non-Hodgkin lymphoma, extranodal and solid organ sites: Secondary | ICD-10-CM | POA: Diagnosis not present

## 2022-02-10 DIAGNOSIS — N183 Chronic kidney disease, stage 3 unspecified: Secondary | ICD-10-CM | POA: Diagnosis not present

## 2022-02-10 DIAGNOSIS — Z880 Allergy status to penicillin: Secondary | ICD-10-CM | POA: Diagnosis not present

## 2022-02-10 DIAGNOSIS — Z7901 Long term (current) use of anticoagulants: Secondary | ICD-10-CM | POA: Diagnosis not present

## 2022-02-10 DIAGNOSIS — Z888 Allergy status to other drugs, medicaments and biological substances status: Secondary | ICD-10-CM | POA: Diagnosis not present

## 2022-02-10 DIAGNOSIS — Z9049 Acquired absence of other specified parts of digestive tract: Secondary | ICD-10-CM | POA: Diagnosis not present

## 2022-02-10 DIAGNOSIS — Z833 Family history of diabetes mellitus: Secondary | ICD-10-CM | POA: Diagnosis not present

## 2022-02-10 DIAGNOSIS — Z823 Family history of stroke: Secondary | ICD-10-CM | POA: Diagnosis not present

## 2022-02-10 DIAGNOSIS — Z79899 Other long term (current) drug therapy: Secondary | ICD-10-CM | POA: Diagnosis not present

## 2022-02-10 DIAGNOSIS — I130 Hypertensive heart and chronic kidney disease with heart failure and stage 1 through stage 4 chronic kidney disease, or unspecified chronic kidney disease: Secondary | ICD-10-CM | POA: Diagnosis not present

## 2022-02-10 DIAGNOSIS — C8510 Unspecified B-cell lymphoma, unspecified site: Secondary | ICD-10-CM | POA: Diagnosis not present

## 2022-02-10 DIAGNOSIS — Z85828 Personal history of other malignant neoplasm of skin: Secondary | ICD-10-CM | POA: Diagnosis not present

## 2022-02-10 DIAGNOSIS — Z801 Family history of malignant neoplasm of trachea, bronchus and lung: Secondary | ICD-10-CM | POA: Diagnosis not present

## 2022-02-10 DIAGNOSIS — Z7961 Long term (current) use of immunomodulator: Secondary | ICD-10-CM | POA: Diagnosis not present

## 2022-02-10 DIAGNOSIS — I509 Heart failure, unspecified: Secondary | ICD-10-CM | POA: Diagnosis not present

## 2022-02-10 DIAGNOSIS — C8219 Follicular lymphoma grade II, extranodal and solid organ sites: Secondary | ICD-10-CM | POA: Diagnosis not present

## 2022-02-16 ENCOUNTER — Ambulatory Visit (INDEPENDENT_AMBULATORY_CARE_PROVIDER_SITE_OTHER): Payer: Medicare Other | Admitting: *Deleted

## 2022-02-16 DIAGNOSIS — I5022 Chronic systolic (congestive) heart failure: Secondary | ICD-10-CM

## 2022-02-16 DIAGNOSIS — E119 Type 2 diabetes mellitus without complications: Secondary | ICD-10-CM

## 2022-02-16 DIAGNOSIS — C8258 Diffuse follicle center lymphoma, lymph nodes of multiple sites: Secondary | ICD-10-CM

## 2022-02-16 NOTE — Chronic Care Management (AMB) (Signed)
Chronic Care Management   CCM RN Visit Note  02/16/2022 Name: ROPER TOLSON MRN: [id number] DOB: 1946-03-29  Subjective: KETRICK MATNEY is a 76 y.o. year old male who is a primary care patient of [name], MD. The care management team was consulted for assistance with disease management and care coordination needs.    Engaged with patient by telephone for follow up visit in response to provider referral for case management and/or care coordination services.   Consent to Services:  The patient was given information about Chronic Care Management services, agreed to services, and gave verbal consent prior to initiation of services.  Please see initial visit note for detailed documentation.   Patient agreed to services and verbal consent obtained.   Assessment: Review of patient past medical history, allergies, medications, health status, including review of consultants reports, laboratory and other test data, was performed as part of comprehensive evaluation and provision of chronic care management services.   SDOH (Social Determinants of Health) assessments and interventions performed:    CCM Care Plan  Allergies  Allergen Reactions   Flomax [Tamsulosin Hcl] Nausea Only    Dizziness  Pt is on flomax   Heparin Other (See Comments)    Tingling in face and shortness of breath    Outpatient Encounter Medications as of 02/16/2022  Medication Sig Note   acetaminophen (TYLENOL) 500 MG tablet Take 1,000 mg by mouth every 6 (six) hours as needed for moderate pain or headache.    amiodarone (PACERONE) 200 MG tablet Take 1 tablet (200 mg total) by mouth daily.    amLODipine (NORVASC) 10 MG tablet TAKE 1 TABLET BY MOUTH ONCE DAILY.    apixaban (ELIQUIS) 5 MG TABS tablet TAKE ONE TABLET BY MOUTH 2 TIMES A DAY.    blood glucose meter kit and supplies KIT Inject 1 each into the skin 4 (four) times daily -  before meals and at bedtime. Dispense based on patient and insurance preference. Use up  to four times daily as directed.    cetirizine (ZYRTEC) 10 MG tablet Take 1 tablet (10 mg total) by mouth daily as needed for allergies.    Continuous Blood Gluc Receiver (FREESTYLE LIBRE 2 READER) DEVI Use to monitor blood glucose continuously    Continuous Blood Gluc Sensor (FREESTYLE LIBRE 2 SENSOR) MISC USE TO MONITOR BLOOD SUGAR DAILY. (CHANGE EVERY 2WEEKS)    furosemide (LASIX) 20 MG tablet Take 1 tablet (20 mg total) by mouth every other day. (May take an additional tab as needed.)    glucose blood (ONETOUCH ULTRA) test strip 1 each by Other route 4 (four) times daily.    hydrALAZINE (APRESOLINE) 100 MG tablet Take 1 tablet (100 mg total) by mouth 2 (two) times daily.    insulin lispro (HUMALOG KWIKPEN) 100 UNIT/ML KwikPen Inject 1-10 Units into the skin 4 (four) times daily -  before meals and at bedtime. Blood sugars 70-120: 1 unit 121-150: 2 units 151-200: 3 units 201-250: 4 units 251-300: 5 units 301-350: 6 units 351-400: 9 units More than 400, 10 units and call your doctor 07/16/2021: Patient only using if blood glucose >180 per instructions from Dr. Moshe Cipro   Insulin Pen Needle 29G X 12MM MISC 1 each by Does not apply route 4 (four) times daily -  before meals and at bedtime.    isosorbide mononitrate (IMDUR) 30 MG 24 hr tablet TAKE 1/2 TABLET BY MOUTH ONCE DAILY.    Lancets (ONETOUCH DELICA PLUS MBEMLJ44B) MISC 1 each  by Other route 4 (four) times daily.    lenalidomide (REVLIMID) 10 MG capsule Take 10 mg by mouth. Take 10 mg on days 1-21 of a 28 day cycle.    metoprolol succinate (TOPROL XL) 25 MG 24 hr tablet Take 0.5 tablets (12.5 mg total) by mouth daily.    omeprazole (PRILOSEC) 20 MG capsule Take 1 capsule (20 mg total) by mouth daily as needed (reflux).    potassium chloride SA (KLOR-CON M20) 20 MEQ tablet Take 1 tablet (20 mEq total) by mouth daily.    VENTOLIN HFA 108 (90 Base) MCG/ACT inhaler Inhale 2 puffs into the lungs every 6 (six) hours as needed for wheezing or shortness  of breath.    No facility-administered encounter medications on file as of 02/16/2022.    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   Atrial fibrillation ([place]) 03/20/2020   Essential hypertension 08/08/2014   GERD (gastroesophageal reflux disease) 08/08/2014    Conditions to be addressed/monitored:CHF, DMII, and Cancer  Care Plan : RN Care Manager plan of care  Updates made by Kassie Mends, RN since 02/16/2022 12:00 AM     Problem: No plan of care established for management of chronic disease states (CHF, cancer, A-fib, HTN)   Priority: High     Long-Range Goal: Development of plan of care for chronic disease management (CHF, cancer, A-fib, HTN)   Start Date: 08/04/2021  Expected End Date: 06/13/2022  Priority: High  Note:   Current Barriers:  Knowledge Deficits related to plan of care for management of CHF and Cancer (follicular lymphoma)  Patient lives with wife [name] and she provides oversight for all of patient's care, they have adult children.  [name] reports pt requires assistance with bathing, walking, cooking, etc, has needed DME in the home and uses walker and cane for ambulation.  Patient sees oncologist at [facility] and had MRI to further assess plan of care for cancer, started chemotherapy infusions and had 4 treatments total in October and November and will restart 09/30/21 and continues Revlimid oral med 21 days on and 7 days off, per spouse, pt then had brain MRI and full body PET scan on 08/2021 and states there is "improvement  in different parts of his body". Update- pt had MRI and PET scan 12/02/21 and cancer is in remission per spouse, will be continuing q 8 weeks and MRI/ PET scan scheduled for q 12 weeks for 18 months.  Pt is eating well, has all medications and taking as prescribed, denies pain and nausea. Patient does try to walk through the house to stay active, is sleeping well, did receive flu vaccine.  Wife is very supportive and very involved with patient's care. Patient does weigh daily and record, weight ranges 176-178 pounds.  Patient now has [employer] (when receiving steroids with chemotherapy increases blood sugar) spouse reports fasting readings 78-94 ranges, random ranges 90-100. Patient went to ED 09/26/21 with Covid and is overall better now except for a feeling of "water and popping in ears and can't hardly hear anything"  Spouse reports PA at cancer center at [facility] assessed ears and saw buildup of wax and patient used Milta Deiters Med drops but this did not help, spouse ask if pt should see ENT or report to primary care provider. Per spouse, Debrox drops did not help with hearing and feeling of water in the ears, pt saw audiologist at [facility] and had hearing tests completed and hearing loss confirmed and referred to the "medical side for hearing issues and may have tubes placed in ear because there is fluid in the ear", left ear is affected. 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  Palliative care continues outreach with most recent outreach 01/28/22. RNCM Clinical Goal(s):  Patient/ spouse will verbalize understanding of disease states CHF, Cancer as evidenced by pt/ spouse report, review of EHR,  through  collaboration with RN Care manager, provider, and care team.  Patient will take all medications as prescribed as evidenced by pt/ spouse report, review of EHR and collaboration with CCM pharmacist.  Interventions: 1:1 collaboration with primary care provider regarding development and update of comprehensive plan of care as evidenced by provider attestation and co-signature Inter-disciplinary care team collaboration (see longitudinal plan of care) Evaluation of current treatment plan related to  self management and patient's adherence to plan as established by provider  Heart Failure Interventions: Discussed importance of daily weight and advised patient to weigh and record daily Discussed the importance of keeping all appointments with provider Reviewed heart failure action plan and importance of calling doctor early on for change in health status, symptoms with emphasis on yellow zone Reviewed low sodium diet and food choices Reinforced importance of taking medications as prescribed  Oncology:  (Status: New goal.) Assessment of understanding of oncology diagnosis:  Assessed patient understanding of cancer diagnosis and recommended treatment plan and Reviewed upcoming provider appointments and treatment appointments  Reviewed infection precautions and importance of staying well, handwashing, wearing a mask as needed, avoiding sick persons Reinforced energy conservation- alternating activity with rest Assessed with spouse that patient does have palliative care currently- nurse continues to call and last outreach 01/28/22 Reviewed upcoming scheduled appointments Reviewed drinking adequate fluids and eating healthy diet Pain assessment completed Encouraged to continue follow up with ENT for any further issues with your ears  Diabetes Interventions:  (Status:  New goal. and Goal on track:  Yes.) Long Term Goal Assessed patient's understanding of A1c goal: <7% Provided education to patient  about basic DM disease process Reviewed medications with patient and discussed importance of medication adherence Counseled on importance of regular laboratory monitoring as prescribed Provided patient with written educational materials related to hypo and hyperglycemia and importance of correct treatment Review of patient status, including review of consultants reports, relevant laboratory and other test results, and medications completed Reviewed carbohydrate modified diet Lab Results  Component Value Date   HGBA1C 6.5 (H) 06/01/2021     Patient Goals/Self-Care Activities: Take medications as prescribed   Attend all scheduled provider appointments Call provider office for new concerns or questions  do ankle pumps when sitting keep legs up while sitting watch for swelling in feet, ankles and legs every day eat more whole grains, fruits  and vegetables, lean meats and healthy fats check feet daily for cuts, sores or redness enter blood sugar readings and medication or insulin into daily log take the blood sugar log to all doctor visits take the blood sugar meter to all doctor visits fill half of plate with vegetables read food labels for fat, fiber, carbohydrates and portion size keep feet up while sitting wash and dry feet carefully every day wear comfortable, cotton socks Continue to weigh daily and record Call your doctor for 3 pound weight gain overnight or 5 pound weight gain in one week Continue to monitor blood sugar and record, take log to doctor's visit Follow carbohydrate modified diet Follow low sodium diet- read food labels, limit fast food Practice good handwashing and wear a mask as needed and avoid sick persons Alternate activity with rest, walk when you can Continue to work with palliative care and call them for questions or concerns Call RN care manager if you have questions at [phone] Continue to follow up with ENT doctor about issues with hearing/ fluid in  the ears Look over education via My Chart- hypoglycemia        Plan:Telephone follow up appointment with care management team member scheduled for:  05/11/22  Jacqlyn Larsen [facility], BSN RN Case Manager [facility] [phone]

## 2022-02-16 NOTE — Patient Instructions (Signed)
Visit Information  Thank you for taking time to visit with me today. Please don't hesitate to contact me if I can be of assistance to you before our next scheduled telephone appointment.  Following are the goals we discussed today:  Take medications as prescribed   Attend all scheduled provider appointments Call provider office for new concerns or questions  do ankle pumps when sitting keep legs up while sitting watch for swelling in feet, ankles and legs every day eat more whole grains, fruits and vegetables, lean meats and healthy fats check feet daily for cuts, sores or redness enter blood sugar readings and medication or insulin into daily log take the blood sugar log to all doctor visits take the blood sugar meter to all doctor visits fill half of plate with vegetables read food labels for fat, fiber, carbohydrates and portion size keep feet up while sitting wash and dry feet carefully every day wear comfortable, cotton socks Continue to weigh daily and record Call your doctor for 3 pound weight gain overnight or 5 pound weight gain in one week Continue to monitor blood sugar and record, take log to doctor's visit Follow carbohydrate modified diet Follow low sodium diet- read food labels, limit fast food Practice good handwashing and wear a mask as needed and avoid sick persons Alternate activity with rest, walk when you can Continue to work with palliative care and call them for questions or concerns Call RN care manager if you have questions at [phone] Continue to follow up with ENT doctor about issues with hearing/ fluid in the ears Look over education via My Chart- hypoglycemia  Our next appointment is by telephone on 05/11/22 at 9 am  Hypoglycemia Hypoglycemia occurs when the level of sugar (glucose) in the blood is too low. Hypoglycemia can happen in people who have or do not have diabetes. It can develop quickly, and it can be a medical emergency. For most people,  a blood glucose level below 70 mg/dL (3.9 mmol/L) is considered hypoglycemia. Glucose is a type of sugar that provides the body's main source of energy. Certain hormones (insulin and glucagon) control the level of glucose in the blood. Insulin lowers blood glucose, and glucagon raises blood glucose. Hypoglycemia can result from having too much insulin in the bloodstream, or from not eating enough food that contains glucose. You may also have reactive hypoglycemia, which happens within 4 hours after eating a meal. What are the causes? Hypoglycemia occurs most often in people who have diabetes and may be caused by: Diabetes medicine. Not eating enough, or not eating often enough. Increased physical activity. Drinking alcohol on an empty stomach. If you do not have diabetes, hypoglycemia may be caused by: A tumor in the pancreas. Not eating enough, or not eating for long periods at a time (fasting). A severe infection or illness. Problems after having bariatric surgery. Organ failure, such as kidney or liver failure. Certain medicines. What increases the risk? Hypoglycemia is more likely to develop in people who: Have diabetes and take medicines to lower blood glucose. Abuse alcohol. Have a severe illness. What are the signs or symptoms? Symptoms vary depending on whether the condition is mild, moderate, or severe. Mild hypoglycemia Hunger. Sweating and feeling clammy. Dizziness or feeling light-headed. Sleepiness or restless sleep. Nausea. Increased heart rate. Headache. Blurry vision. Mood changes, such as irritability or anxiety. Tingling or numbness around the mouth, lips, or tongue. Moderate hypoglycemia Confusion and poor judgment. Behavior changes. Weakness. Irregular heartbeat. A change in  coordination. Severe hypoglycemia Severe hypoglycemia is a medical emergency. It can cause: Fainting. Seizures. Loss of consciousness (coma). Death. How is this  diagnosed? Hypoglycemia is diagnosed with a blood test to measure your blood glucose level. This blood test is done while you are having symptoms. Your health care provider may also do a physical exam and review your medical history. How is this treated? This condition can be treated by immediately eating or drinking something that contains sugar with 15 grams of fast-acting carbohydrate, such as: 4 oz (120 mL) of fruit juice. 4 oz (120 mL) of regular soda (not diet soda). Several pieces of hard candy. Check food labels to find out how many pieces to eat for 15 grams. 1 Tbsp (15 mL) of sugar or honey. 4 glucose tablets. 1 tube of glucose gel. Treating hypoglycemia if you have diabetes If you are alert and able to swallow safely, follow the 15:15 rule: Take 15 grams of a fast-acting carbohydrate. Talk with your health care provider about how much you should take. Options for getting 15 grams of fast-acting carbohydrate include: Glucose tablets (take 4 tablets). Several pieces of hard candy. Check food labels to find out how many pieces to eat for 15 grams. 4 oz (120 mL) of fruit juice. 4 oz (120 mL) of regular soda (not diet soda). 1 Tbsp (15 mL) of sugar or honey. 1 tube of glucose gel. Check your blood glucose 15 minutes after you take the carbohydrate. If the repeat blood glucose level is still at or below 70 mg/dL (3.9 mmol/L), take 15 grams of a carbohydrate again. If your blood glucose level does not increase above 70 mg/dL (3.9 mmol/L) after 3 tries, seek emergency medical care. After your blood glucose level returns to normal, eat a meal or a snack within 1 hour.  Treating severe hypoglycemia Severe hypoglycemia is when your blood glucose level is below 54 mg/dL (3 mmol/L). Severe hypoglycemia is a medical emergency. Get medical help right away. If you have severe hypoglycemia and you cannot eat or drink, you will need to be given glucagon. A family member or close friend should  learn how to check your blood glucose and how to give you glucagon. Ask your health care provider if you need to have an emergency glucagon kit available. Severe hypoglycemia may need to be treated in a hospital. The treatment may include getting glucose through an IV. You may also need treatment for the cause of your hypoglycemia. Follow these instructions at home:  General instructions Take over-the-counter and prescription medicines only as told by your health care provider. Monitor your blood glucose as told by your health care provider. If you drink alcohol: Limit how much you have to: 0-1 drink a day for women who are not pregnant. 0-2 drinks a day for men. Know how much alcohol is in your drink. In the [place] one drink equals one 12 oz bottle of beer (355 mL), one 5 oz glass of wine (148 mL), or one 1 oz glass of hard liquor (44 mL). Be sure to eat food along with drinking alcohol. Be aware that alcohol is absorbed quickly and may have lingering effects that may result in hypoglycemia later. Be sure to do ongoing glucose monitoring. Keep all follow-up visits. This is important. If you have diabetes: Always have a fast-acting carbohydrate (15 grams) option with you to treat low blood glucose. Follow your diabetes management plan as directed by your health care provider. Make sure you: Know the symptoms  of hypoglycemia. It is important to treat it right away to prevent it from becoming severe. Check your blood glucose as often as told. Always check before and after exercise. Always check your blood glucose before you drive a motorized vehicle. Take your medicines as told. Follow your meal plan. Eat on time, and do not skip meals. Share your diabetes management plan with people in your workplace, school, and household. Carry a medical alert card or wear medical alert jewelry. Where to find more information American Diabetes Association: www.diabetes.org Contact a health care provider  if: You have problems keeping your blood glucose in your target range. You have frequent episodes of hypoglycemia. Get help right away if: You continue to have hypoglycemia symptoms after eating or drinking something that contains 15 grams of fast-acting carbohydrate, and you cannot get your blood glucose above 70 mg/dL (3.9 mmol/L) while following the 15:15 rule. Your blood glucose is below 54 mg/dL (3 mmol/L). You have a seizure. You faint. These symptoms may represent a serious problem that is an emergency. Do not wait to see if the symptoms will go away. Get medical help right away. Call your local emergency services (911 in the [place] Do not drive yourself to the hospital. Summary Hypoglycemia occurs when the level of sugar (glucose) in the blood is too low. Hypoglycemia can happen in people who have or do not have diabetes. It can develop quickly, and it can be a medical emergency. Make sure you know the symptoms of hypoglycemia and how to treat it. Always have a fast-acting carbohydrate option with you to treat low blood sugar. This information is not intended to replace advice given to you by your health care provider. Make sure you discuss any questions you have with your health care provider. Document Revised: 08/07/2020 Document Reviewed: 08/07/2020 Elsevier Patient Education  [place].   Please call the care guide team at [phone] if you need to cancel or reschedule your appointment.   If you are experiencing a Mental Health or [place] or need someone to talk to, please call the Suicide and Crisis Lifeline: 988 call the [place] National Suicide Prevention Lifeline: 951-204-9200 or TTY: 959 305 0720 TTY 440-745-9553) to talk to a trained counselor call 1-800-273-TALK (toll free, 24 hour hotline) go to [facility] [address] [place] [phone]) call the [place]: [phone] call  911   Patient verbalizes understanding of instructions and care plan provided today and agrees to view in [place]. Active MyChart status and patient understanding of how to access instructions and care plan via MyChart confirmed with patient.     Jacqlyn Larsen [facility], BSN RN Case Manager [facility] [phone]

## 2022-02-17 DIAGNOSIS — I5020 Unspecified systolic (congestive) heart failure: Secondary | ICD-10-CM | POA: Diagnosis not present

## 2022-02-17 DIAGNOSIS — Z794 Long term (current) use of insulin: Secondary | ICD-10-CM

## 2022-02-17 DIAGNOSIS — Z85118 Personal history of other malignant neoplasm of bronchus and lung: Secondary | ICD-10-CM

## 2022-02-17 DIAGNOSIS — E1159 Type 2 diabetes mellitus with other circulatory complications: Secondary | ICD-10-CM

## 2022-02-19 ENCOUNTER — Other Ambulatory Visit: Payer: Self-pay | Admitting: Cardiology

## 2022-03-03 DIAGNOSIS — C8589 Other specified types of non-Hodgkin lymphoma, extranodal and solid organ sites: Secondary | ICD-10-CM | POA: Diagnosis not present

## 2022-03-05 ENCOUNTER — Other Ambulatory Visit (HOSPITAL_COMMUNITY): Payer: Self-pay | Admitting: Physician Assistant

## 2022-03-05 ENCOUNTER — Encounter: Payer: Self-pay | Admitting: Cardiology

## 2022-03-08 ENCOUNTER — Other Ambulatory Visit: Payer: Self-pay | Admitting: *Deleted

## 2022-03-08 NOTE — Telephone Encounter (Signed)
50s is ok, 40s is getting low and certaintly 86L is worrisome. I would stop his toprol and update [place] on heart rates in 1 week    Zandra Abts MD

## 2022-03-17 ENCOUNTER — Ambulatory Visit (INDEPENDENT_AMBULATORY_CARE_PROVIDER_SITE_OTHER): Payer: Medicare Other | Admitting: Internal Medicine

## 2022-03-17 ENCOUNTER — Encounter: Payer: Self-pay | Admitting: Internal Medicine

## 2022-03-17 VITALS — BP 114/78 | HR 67 | Ht 72.0 in | Wt 184.0 lb

## 2022-03-17 DIAGNOSIS — Z0001 Encounter for general adult medical examination with abnormal findings: Secondary | ICD-10-CM | POA: Diagnosis not present

## 2022-03-17 DIAGNOSIS — R739 Hyperglycemia, unspecified: Secondary | ICD-10-CM

## 2022-03-17 DIAGNOSIS — C8258 Diffuse follicle center lymphoma, lymph nodes of multiple sites: Secondary | ICD-10-CM | POA: Diagnosis not present

## 2022-03-17 DIAGNOSIS — Z125 Encounter for screening for malignant neoplasm of prostate: Secondary | ICD-10-CM | POA: Insufficient documentation

## 2022-03-17 DIAGNOSIS — H906 Mixed conductive and sensorineural hearing loss, bilateral: Secondary | ICD-10-CM | POA: Insufficient documentation

## 2022-03-17 DIAGNOSIS — I10 Essential (primary) hypertension: Secondary | ICD-10-CM | POA: Diagnosis not present

## 2022-03-17 DIAGNOSIS — I4891 Unspecified atrial fibrillation: Secondary | ICD-10-CM

## 2022-03-17 DIAGNOSIS — N1832 Chronic kidney disease, stage 3b: Secondary | ICD-10-CM

## 2022-03-17 NOTE — Assessment & Plan Note (Signed)
Physical exam as documented. Fasting blood tests ordered - recent CBC and CMP reviewed.

## 2022-03-17 NOTE — Assessment & Plan Note (Signed)
On Amiodarone and Eliquis Metoprolol discontinued recently due to bradycardia Followed by Cardiology

## 2022-03-17 NOTE — Assessment & Plan Note (Signed)
Had CNS lymphoma, followed by Heme/Onc. At [facility] - on lenalidomide and Rituximab, responding well Was given Dexamethasone, which decreased size of CNS lesions. Last Oncology visit note reviewed - last MRI brain and PET scan reviewed

## 2022-03-17 NOTE — Assessment & Plan Note (Signed)
Ordered PSA after discussing its limitations for prostate cancer screening, including false positive results leading additional investigations.

## 2022-03-17 NOTE — Assessment & Plan Note (Signed)
Recent decrease in GFR could be due to multiple new oncology medications Advised to maintain adequate hydration Avoid other nephrotoxic agents like NSAIDs

## 2022-03-17 NOTE — Progress Notes (Signed)
Established Patient Office Visit  Subjective:  Patient ID: Darius Smith, male    DOB: 03-12-1946  Age: 76 y.o. MRN: [id number]  CC:  Chief Complaint  Patient presents with   Annual Exam    Pt following up and here for CPE    HPI Darius Smith is a 76 y.o. male with past medical history of HTN, atrial fibrillation, HFrEF, GERD, CKD stage IIIb, follicular lymphoma with CNS lesions who presents for annual physical.  HTN and A Fib.: BP is well-controlled. Takes medications regularly. Patient denies headache, dizziness, chest pain, dyspnea or palpitations.   Follicular lymphoma: He is followed by heme-onc at [facility].  He is on chemotherapy currently and is responding well.  He denies any dizziness currently.  Denies any nausea, vomiting, fatigue, abdominal pain or recent change in weight or appetite.   Stage IIIb CKD: Last BMP reviewed.  He agrees to increase fluid intake for now.  Denies any dysuria or hematuria currently.  Past Medical History:  Diagnosis Date   Allergic rhinitis 07/02/2019   Arthritis    both hands   Atrial fibrillation ([place]) 10/28/2017   Dr. Harl Bowie   Atrial fibrillation with RVR ([place]) 07/14/2020   Blood in urine    occ   Bronchitis    Cancer ([place])    Basal cell   Cardiomegaly 10/28/2017   CHF (congestive heart failure) ([place])    Complication of anesthesia    Mr. Mcmanus developed a. fib in recovery after cysto procedure 10/2017 was transferred to [facility] and underwent successful cardioversion   Diabetes [facility])    Diverticulosis of sigmoid colon 08/2017   Noted on colonoscopy   Dysrhythmia    External hemorrhoids 08/2017   GERD (gastroesophageal reflux disease)    History of colon polyps 08/2017   History of kidney stones    History of rectal bleeding    History of right inguinal hernia    Hypertension    Lung cancer ([place]) 06/25/2020   Lymphoma ([place])    Nephrolithiasis 07/02/2019   Rectal bleeding 09/02/2017   Added automatically from request for  surgery [id number]   Shortness of breath    Stage 3 chronic kidney disease [facility])     Past Surgical History:  Procedure Laterality Date   AXILLARY LYMPH NODE BIOPSY Left 06/05/2021   Procedure: AXILLARY LYMPH NODE BIOPSY;  Surgeon: Virl Cagey, MD;  Location: [facility] ORS;  Service: General;  Laterality: Left;   CARDIOVERSION     10/2017   CARDIOVERSION N/A 07/16/2020   Procedure: CARDIOVERSION;  Surgeon: Arnoldo Lenis, MD;  Location: [facility] ENDO SUITE;  Service: Endoscopy;  Laterality: N/A;   CARDIOVERSION N/A 08/21/2020   Procedure: CARDIOVERSION;  Surgeon: Satira Sark, MD;  Location: [facility] ORS;  Service: Cardiovascular;  Laterality: N/A;   CARDIOVERSION N/A 09/10/2020   Procedure: CARDIOVERSION;  Surgeon: Josue Hector, MD;  Location: [facility] ENDOSCOPY;  Service: Cardiovascular;  Laterality: N/A;   CHOLECYSTECTOMY     CLEFT LIP REPAIR     several from childhood til 76 years old   [place]     several from childhood til 76 years old   COLONOSCOPY  04/28/2011   Procedure: COLONOSCOPY;  Surgeon: Rogene Houston, MD;  Location: [facility];  Service: Endoscopy;  Laterality: N/A;   COLONOSCOPY N/A 07/18/2014   Procedure: COLONOSCOPY;  Surgeon: Rogene Houston, MD;  Location: [facility] END[facility];  Service: Endoscopy;  Laterality: N/A;  830   COLONOSCOPY N/A  09/09/2017   Procedure: COLONOSCOPY;  Surgeon: Rogene Houston, MD;  Location: [facility];  Service: Endoscopy;  Laterality: N/A;  955   CYSTOSCOPY/URETEROSCOPY/HOLMIUM LASER/STENT PLACEMENT Bilateral 12/06/2017   Procedure: CYSTOSCOPY/RETROGRADE/URETEROSCOPY/HOLMIUM LASER/STENT EXCHANGE;  Surgeon: Festus Aloe, MD;  Location: [facility] ORS;  Service: Urology;  Laterality: Bilateral;  ONLY NEEDS 60 MIN   HERNIA REPAIR     LYMPH NODE BIOPSY Left    Under left arm   POLYPECTOMY  09/09/2017   Procedure: POLYPECTOMY;  Surgeon: Rogene Houston, MD;  Location: [facility] END[facility];  Service: Endoscopy;;  colon   vocal cord surgery       Family History  Problem Relation Age of Onset   Breast cancer Mother    Prostate cancer Father    COPD Sister     Social History   Socioeconomic History   Marital status: Married    Spouse name: Not on file   Number of children: 2   Years of education: Not on file   Highest education level: Not on file  Occupational History   Occupation: retired  Tobacco Use   Smoking status: Never   Smokeless tobacco: Never   Tobacco comments:    Never smoke 11/3021  Vaping Use   Vaping Use: Never used  Substance and Sexual Activity   Alcohol use: Not Currently    Alcohol/week: 7.0 standard drinks of alcohol    Types: 7 Cans of beer per week   Drug use: No   Sexual activity: Not on file  Other Topics Concern   Not on file  Social History Narrative   Lives with wife [name] of 26 years April 15, 2020      2 children, 2 grandchildren   Dog: Gardner Candle      Enjoys: being outdoors       Diet: eats all food groups    Caffeine: drink coffee in the morning, some tea, diet dr pepper   Water: 3-4 cups daily         Wears seat belt    Does not use phone while driving   [place] put up not loaded          Social Determinants of Health   Financial Resource Strain: Low Risk  (03/09/2021)   Overall Financial Resource Strain (CARDIA)    Difficulty of Paying Living Expenses: Not hard at all  Food Insecurity: No Food Insecurity (08/04/2021)   Hunger Vital Sign    Worried About Running Out of Food in the Last Year: Never true    [place] in the Last Year: Never true  Transportation Needs: No Transportation Needs (08/04/2021)   PRAPARE - Hydrologist (Medical): No    Lack of Transportation (Non-Medical): No  Physical Activity: Sufficiently Active (03/09/2021)   Exercise Vital Sign    Days of Exercise per Week: 7 days    Minutes of Exercise per Session: 50 min  Stress: No Stress Concern Present (03/09/2021)   [place]    Feeling of Stress : Not at all  Social Connections: Moderately Integrated (03/09/2021)   Social Connection and Isolation Panel [NHANES]    Frequency of Communication with Friends and Family: More than three times a week    Frequency of Social Gatherings with Friends and Family: More than three times a week    Attends Religious Services: More than 4 times per year  Active Member of Clubs or Organizations: No    Attends Archivist Meetings: Never    Marital Status: Married  Human resources officer Violence: Not At Risk (03/09/2021)   Humiliation, Afraid, Rape, and Kick questionnaire    Fear of Current or Ex-Partner: No    Emotionally Abused: No    Physically Abused: No    Sexually Abused: No    Outpatient Medications Prior to Visit  Medication Sig Dispense Refill   acetaminophen (TYLENOL) 500 MG tablet Take 1,000 mg by mouth every 6 (six) hours as needed for moderate pain or headache.     amiodarone (PACERONE) 200 MG tablet TAKE 1 TABLET BY MOUTH ONCE A DAY. 30 tablet 9   amLODipine (NORVASC) 10 MG tablet TAKE 1 TABLET BY MOUTH ONCE DAILY. 90 tablet 3   apixaban (ELIQUIS) 5 MG TABS tablet TAKE ONE TABLET BY MOUTH 2 TIMES A DAY. 60 tablet 11   blood glucose meter kit and supplies KIT Inject 1 each into the skin 4 (four) times daily -  before meals and at bedtime. Dispense based on patient and insurance preference. Use up to four times daily as directed. 1 each 0   cetirizine (ZYRTEC) 10 MG tablet Take 1 tablet (10 mg total) by mouth daily as needed for allergies. 90 tablet 2   Continuous Blood Gluc Receiver (FREESTYLE LIBRE 2 READER) DEVI Use to monitor blood glucose continuously     Continuous Blood Gluc Sensor (FREESTYLE LIBRE 2 SENSOR) MISC USE TO MONITOR BLOOD SUGAR DAILY. (CHANGE EVERY 2WEEKS) 1 each 3   furosemide (LASIX) 20 MG tablet Take 1 tablet (20 mg total) by mouth every other day. (May take an additional tab as  needed.)     glucose blood (ONETOUCH ULTRA) test strip 1 each by Other route 4 (four) times daily. 100 each 3   hydrALAZINE (APRESOLINE) 100 MG tablet TAKE (1) TABLET BY MOUTH (3) TIMES DAILY. 270 tablet 1   insulin lispro (HUMALOG KWIKPEN) 100 UNIT/ML KwikPen Inject 1-10 Units into the skin 4 (four) times daily -  before meals and at bedtime. Blood sugars 70-120: 1 unit 121-150: 2 units 151-200: 3 units 201-250: 4 units 251-300: 5 units 301-350: 6 units 351-400: 9 units More than 400, 10 units and call your doctor 15 mL 11   Insulin Pen Needle 29G X 12MM MISC 1 each by Does not apply route 4 (four) times daily -  before meals and at bedtime. 100 each 0   isosorbide mononitrate (IMDUR) 30 MG 24 hr tablet TAKE 1/2 TABLET BY MOUTH ONCE DAILY. 45 tablet 3   Lancets (ONETOUCH DELICA PLUS [id number]) MISC 1 each by Other route 4 (four) times daily.     lenalidomide (REVLIMID) 10 MG capsule Take 10 mg by mouth. Take 10 mg on days 1-21 of a 28 day cycle.     omeprazole (PRILOSEC) 20 MG capsule Take 1 capsule (20 mg total) by mouth daily as needed (reflux). 90 capsule 1   potassium chloride SA (KLOR-CON M20) 20 MEQ tablet Take 1 tablet (20 mEq total) by mouth daily. 30 tablet 6   VENTOLIN HFA 108 (90 Base) MCG/ACT inhaler Inhale 2 puffs into the lungs every 6 (six) hours as needed for wheezing or shortness of breath. 18 g 3   No facility-administered medications prior to visit.    Allergies  Allergen Reactions   Flomax [Tamsulosin Hcl] Nausea Only    Dizziness  Pt is on flomax   Heparin Other (See Comments)  Tingling in face and shortness of breath    ROS Review of Systems  Constitutional:  Negative for chills and fever.  HENT:  Negative for congestion and sore throat.   Eyes:  Negative for pain and discharge.  Respiratory:  Negative for cough and shortness of breath.   Cardiovascular:  Negative for chest pain and palpitations.  Gastrointestinal:  Negative for diarrhea, nausea and vomiting.   Endocrine: Negative for polydipsia and polyuria.  Genitourinary:  Negative for dysuria and hematuria.  Musculoskeletal:  Negative for neck pain and neck stiffness.  Skin:  Negative for rash.  Neurological:  Negative for dizziness, weakness, numbness and headaches.  Psychiatric/Behavioral:  Negative for agitation and behavioral problems.       Objective:    Physical Exam Constitutional:      General: He is not in acute distress.    Appearance: He is not diaphoretic.  HENT:     Head: Normocephalic and atraumatic.     Nose: No congestion.     Mouth/Throat:     Pharynx: No oropharyngeal exudate.  Eyes:     General: No scleral icterus.    Extraocular Movements: Extraocular movements intact.  Cardiovascular:     Rate and Rhythm: Normal rate and regular rhythm.     Heart sounds: Normal heart sounds. No murmur heard. Pulmonary:     Breath sounds: Normal breath sounds. No wheezing or rales.  Abdominal:     Palpations: Abdomen is soft.     Tenderness: There is no abdominal tenderness.  Musculoskeletal:     Cervical back: Neck supple. No tenderness.  Skin:    General: Skin is dry.     Findings: No rash.  Neurological:     General: No focal deficit present.     Mental Status: He is alert and oriented to person, place, and time.     Cranial Nerves: No cranial nerve deficit.     Sensory: No sensory deficit.     Motor: No weakness.  Psychiatric:        Mood and Affect: Mood normal.        Behavior: Behavior normal.     BP 114/78   Pulse 67   Ht 6' (1.829 m)   Wt 184 lb (83.5 kg)   SpO2 98%   BMI 24.95 kg/m  Wt Readings from Last 3 Encounters:  03/17/22 184 lb (83.5 kg)  12/17/21 182 lb 3.2 oz (82.6 kg)  11/10/21 180 lb 12.8 oz (82 kg)    Lab Results  Component Value Date   TSH 1.441 06/18/2021   Lab Results  Component Value Date   WBC 4.9 09/27/2021   HGB 11.7 (L) 09/27/2021   HCT 37.0 (L) 09/27/2021   MCV 90.2 09/27/2021   PLT 112 (L) 09/27/2021   Lab  Results  Component Value Date   NA 134 (L) 09/27/2021   K 3.3 (L) 09/27/2021   CO2 23 09/27/2021   GLUCOSE 98 09/27/2021   BUN 12 09/27/2021   CREATININE 1.44 (H) 09/27/2021   BILITOT 0.4 08/10/2021   ALKPHOS 67 08/10/2021   AST 11 08/10/2021   ALT 17 08/10/2021   PROT 5.9 (L) 08/10/2021   ALBUMIN 3.8 08/10/2021   CALCIUM 7.9 (L) 09/27/2021   ANIONGAP 9 09/27/2021   EGFR 43 (L) 08/10/2021   Lab Results  Component Value Date   CHOL 177 08/10/2021   Lab Results  Component Value Date   HDL 50 08/10/2021   Lab Results  Component Value Date  [place] 107 (H) 08/10/2021   Lab Results  Component Value Date   TRIG 108 08/10/2021   Lab Results  Component Value Date   CHOLHDL 2.9 07/31/2020   Lab Results  Component Value Date   HGBA1C 6.5 (H) 06/01/2021      Assessment & Plan:   Problem List Items Addressed This Visit       Cardiovascular and Mediastinum   Essential hypertension   Atrial fibrillation (HCC)    On Amiodarone and Eliquis Metoprolol discontinued recently due to bradycardia Followed by Cardiology      Relevant Orders   TSH + free T4     Genitourinary   Stage 3b chronic kidney disease ([place])    Recent decrease in GFR could be due to multiple new oncology medications Advised to maintain adequate hydration Avoid other nephrotoxic agents like NSAIDs        Other   Follicular lymphoma ([place])    Had CNS lymphoma, followed by Heme/Onc. At [facility] - on lenalidomide and Rituximab, responding well Was given Dexamethasone, which decreased size of CNS lesions. Last Oncology visit note reviewed - last MRI brain and PET scan reviewed      Encounter for general adult medical examination with abnormal findings - Primary    Physical exam as documented. Fasting blood tests ordered - recent CBC and CMP reviewed.      Relevant Orders   Lipid Profile   TSH + free T4   Prostate cancer screening    Ordered PSA after discussing its limitations for  prostate cancer screening, including false positive results leading additional investigations.      Relevant Orders   PSA   Steroid-induced hyperglycemia    Had hyperglycemia due to steroid, needed insulin for short-term while on steroids Check HbA1C      Relevant Orders   Hemoglobin A1c    No orders of the defined types were placed in this encounter.   Follow-up: Return in about 6 months (around 09/16/2022) for Annual physical.    [name], MD

## 2022-03-17 NOTE — Patient Instructions (Signed)
Please continue taking medications as prescribed.  Please continue to follow low salt diet and ambulate as tolerated. 

## 2022-03-17 NOTE — Assessment & Plan Note (Signed)
Had hyperglycemia due to steroid, needed insulin for short-term while on steroids Check HbA1C

## 2022-03-18 ENCOUNTER — Other Ambulatory Visit: Payer: Self-pay | Admitting: *Deleted

## 2022-03-18 DIAGNOSIS — I10 Essential (primary) hypertension: Secondary | ICD-10-CM

## 2022-03-18 NOTE — Progress Notes (Signed)
r 

## 2022-03-19 NOTE — Telephone Encounter (Signed)
Heart rates low at times but not sustained,  if asymptomatic meaning no severe lightheadedness or dizzienss would be ok for now.   Zandra Abts MD

## 2022-03-22 ENCOUNTER — Ambulatory Visit (INDEPENDENT_AMBULATORY_CARE_PROVIDER_SITE_OTHER): Payer: Medicare Other

## 2022-03-22 DIAGNOSIS — Z0000 Encounter for general adult medical examination without abnormal findings: Secondary | ICD-10-CM

## 2022-03-22 NOTE — Progress Notes (Signed)
I connected with  [name] on 03/22/22 by a audio enabled telemedicine application and verified that I am speaking with the correct person using two identifiers.  Patient Location: Home  Provider Location: Office/Clinic  I discussed the limitations of evaluation and management by telemedicine. The patient expressed understanding and agreed to proceed.  Subjective:   TREVIS EDEN is a 76 y.o. male who presents for Medicare Annual/Subsequent preventive examination.  Review of Systems     Cardiac Risk Factors include: hypertension;male gender;advanced age (>38mn, >>17women)     Objective:    There were no vitals filed for this visit. There is no height or weight on file to calculate BMI.     03/22/2022   10:43 AM 09/26/2021   11:39 PM 08/04/2021   11:27 AM 06/03/2021    6:00 PM 06/01/2021    6:39 PM 06/01/2021    3:14 PM 03/09/2021    1:17 PM  Advanced Directives  Does Patient Have a Medical Advance Directive? Yes Yes Yes Yes No Yes Yes  Type of AScientist, physiologicalof [place] will Healthcare Power of [place] will  Does patient want to make changes to medical advance directive?   No - Patient declined No - Patient declined  No - Patient declined No - Patient declined  Copy of HGuyin Chart?   No - copy requested    Yes - validated most recent copy scanned in chart (See row information)  Would patient like information on creating a medical advance directive?    No - Patient declined No - Patient declined      Current Medications (verified) Outpatient Encounter Medications as of 03/22/2022  Medication Sig   acetaminophen (TYLENOL) 500 MG tablet Take 1,000 mg by mouth every 6 (six) hours as needed for moderate pain or headache.   amiodarone (PACERONE) 200 MG tablet TAKE 1 TABLET BY MOUTH ONCE A DAY.   amLODipine (NORVASC) 10 MG tablet TAKE 1 TABLET BY MOUTH ONCE DAILY.   apixaban (ELIQUIS) 5 MG  TABS tablet TAKE ONE TABLET BY MOUTH 2 TIMES A DAY.   blood glucose meter kit and supplies KIT Inject 1 each into the skin 4 (four) times daily -  before meals and at bedtime. Dispense based on patient and insurance preference. Use up to four times daily as directed.   cetirizine (ZYRTEC) 10 MG tablet Take 1 tablet (10 mg total) by mouth daily as needed for allergies.   Continuous Blood Gluc Receiver (FREESTYLE LIBRE 2 READER) DEVI Use to monitor blood glucose continuously   Continuous Blood Gluc Sensor (FREESTYLE LIBRE 2 SENSOR) MISC USE TO MONITOR BLOOD SUGAR DAILY. (CHANGE EVERY 2WEEKS)   furosemide (LASIX) 20 MG tablet Take 1 tablet (20 mg total) by mouth every other day. (May take an additional tab as needed.)   glucose blood (ONETOUCH ULTRA) test strip 1 each by Other route 4 (four) times daily.   hydrALAZINE (APRESOLINE) 100 MG tablet TAKE (1) TABLET BY MOUTH (3) TIMES DAILY.   insulin lispro (HUMALOG KWIKPEN) 100 UNIT/ML KwikPen Inject 1-10 Units into the skin 4 (four) times daily -  before meals and at bedtime. Blood sugars 70-120: 1 unit 121-150: 2 units 151-200: 3 units 201-250: 4 units 251-300: 5 units 301-350: 6 units 351-400: 9 units More than 400, 10 units and call your doctor   Insulin Pen Needle 29G X 12MM MISC 1 each by Does not apply  route 4 (four) times daily -  before meals and at bedtime.   isosorbide mononitrate (IMDUR) 30 MG 24 hr tablet TAKE 1/2 TABLET BY MOUTH ONCE DAILY.   Lancets (ONETOUCH DELICA PLUS EGBTDV76H) MISC 1 each by Other route 4 (four) times daily.   lenalidomide (REVLIMID) 10 MG capsule Take 10 mg by mouth. Take 10 mg on days 1-21 of a 28 day cycle.   omeprazole (PRILOSEC) 20 MG capsule Take 1 capsule (20 mg total) by mouth daily as needed (reflux).   potassium chloride SA (KLOR-CON M20) 20 MEQ tablet Take 1 tablet (20 mEq total) by mouth daily.   VENTOLIN HFA 108 (90 Base) MCG/ACT inhaler Inhale 2 puffs into the lungs every 6 (six) hours as needed for wheezing  or shortness of breath.   No facility-administered encounter medications on file as of 03/22/2022.    Allergies (verified) Flomax [tamsulosin hcl] and Heparin   History: Past Medical History:  Diagnosis Date   Allergic rhinitis 07/02/2019   Allergy 2015   Arthritis    both hands   Atrial fibrillation ([place]) 10/28/2017   Dr. Harl Bowie   Atrial fibrillation with RVR ([place]) 07/14/2020   Blood in urine    occ   Bronchitis    Cancer ([place])    Basal cell   Cardiomegaly 10/28/2017   CHF (congestive heart failure) (HCC)    Clotting disorder ([place])    Complication of anesthesia    Mr. Choyce developed a. fib in recovery after cysto procedure 10/2017 was transferred to [facility] and underwent successful cardioversion   Diabetes [facility])    Diverticulosis of sigmoid colon 08/2017   Noted on colonoscopy   Dysrhythmia    External hemorrhoids 08/2017   GERD (gastroesophageal reflux disease)    History of colon polyps 08/2017   History of kidney stones    History of rectal bleeding    History of right inguinal hernia    Hypertension    Lung cancer ([place]) 06/25/2020   Lymphoma ([place])    Nephrolithiasis 07/02/2019   Rectal bleeding 09/02/2017   Added automatically from request for surgery [id number]   Shortness of breath    Stage 3 chronic kidney disease [facility])    Past Surgical History:  Procedure Laterality Date   AXILLARY LYMPH NODE BIOPSY Left 06/05/2021   Procedure: AXILLARY LYMPH NODE BIOPSY;  Surgeon: Virl Cagey, MD;  Location: [facility]S;  Service: General;  Laterality: Left;   CARDIOVERSION     10/2017   CARDIOVERSION N/A 07/16/2020   Procedure: CARDIOVERSION;  Surgeon: Arnoldo Lenis, MD;  Location: [facility] END[facility] SU[facility];  Service: Endoscopy;  Laterality: N/A;   CARDIOVERSION N/A 08/21/2020   Procedure: CARDIOVERSION;  Surgeon: Satira Sark, MD;  Location: [facility];  Service: Cardiovascular;  Laterality: N/A;   CARDIOVERSION N/A 09/10/2020   Procedure: CARDIOVERSION;  Surgeon: Josue Hector, MD;  Location: [facility] ENDOSCOPY;  Service: Cardiovascular;  Laterality: N/A;   CHOLECYSTECTOMY     CLEFT LIP REPAIR     several from childhood til 75 years old   [place]     several from childhood til 76 years old   COLONOSCOPY  04/28/2011   Procedure: COLONOSCOPY;  Surgeon: Rogene Houston, MD;  Location: [facility];  Service: Endoscopy;  Laterality: N/A;   COLONOSCOPY N/A 07/18/2014   Procedure: COLONOSCOPY;  Surgeon: Rogene Houston, MD;  Location: [facility];  Service: Endoscopy;  Laterality: N/A;  830   COLONOSCOPY N/A 09/09/2017  Procedure: COLONOSCOPY;  Surgeon: Rogene Houston, MD;  Location: [facility];  Service: Endoscopy;  Laterality: N/A;  955   COSMETIC SURGERY  1940's &1950's   CYSTOSCOPY/URETEROSCOPY/HOLMIUM LASER/STENT PLACEMENT Bilateral 12/06/2017   Procedure: CYSTOSCOPY/RETROGRADE/URETEROSCOPY/HOLMIUM LASER/STENT EXCHANGE;  Surgeon: Festus Aloe, MD;  Location: [facility]S;  Service: Urology;  Laterality: Bilateral;  ONLY NEEDS 60 MIN   HERNIA REPAIR     LYMPH NODE BIOPSY Left    Under left arm   POLYPECTOMY  09/09/2017   Procedure: POLYPECTOMY;  Surgeon: Rogene Houston, MD;  Location: [facility];  Service: Endoscopy;;  colon   vocal cord surgery     Family History  Problem Relation Age of Onset   Breast cancer Mother    Cancer Mother    Prostate cancer Father    Cancer Father    COPD Sister    Birth defects Son    Social History   Socioeconomic History   Marital status: Married    Spouse name: Not on file   Number of children: 2   Years of education: Not on file   Highest education level: Not on file  Occupational History   Occupation: retired  Tobacco Use   Smoking status: Never   Smokeless tobacco: Never   Tobacco comments:    Never smoke 11/3021  Vaping Use   Vaping Use: Never used  Substance and Sexual Activity   Alcohol use: Not Currently   Drug use: No   Sexual activity: Not Currently  Other Topics Concern    Not on file  Social History Narrative   Lives with wife [name] of 61 years April 15, 2020      2 children, 2 grandchildren   Dog: Gardner Candle      Enjoys: being outdoors       Diet: eats all food groups    Caffeine: drink coffee in the morning, some tea, diet dr pepper   Water: 3-4 cups daily         Wears seat belt    Does not use phone while driving   [place] put up not loaded          Social Determinants of Health   Financial Resource Strain: [place]  (03/18/2022)   Overall Financial Resource Strain (CARDIA)    Difficulty of Paying Living Expenses: Not hard at all  Food Insecurity: No Food Insecurity (03/18/2022)   Hunger Vital Sign    Worried About Running Out of Food in the Last Year: Never true    [place] in the Last Year: Never true  Transportation Needs: No Transportation Needs (03/18/2022)   PRAPARE - Hydrologist (Medical): No    Lack of Transportation (Non-Medical): No  Physical Activity: Insufficiently Active (03/18/2022)   Exercise Vital Sign    Days of Exercise per Week: 6 days    Minutes of Exercise per Session: 20 min  Stress: No Stress Concern Present (03/18/2022)   [place]    Feeling of Stress : Not at all  Social Connections: Unknown (03/18/2022)   Social Connection and Isolation Panel [NHANES]    Frequency of Communication with Friends and Family: More than three times a week    Frequency of Social Gatherings with Friends and Family: Twice a week    Attends Religious Services: Not on Diplomatic Services operational officer of Clubs or Organizations: Yes  Attends Archivist Meetings: 1 to 4 times per year    Marital Status: Married    Tobacco Counseling Counseling given: Not Answered Tobacco comments: Never smoke 11/3021   Clinical Intake:           Diabetes: No  How often do you need to have someone help you when you  read instructions, pamphlets, or other written materials from your doctor or pharmacy?: 1 - Never  [place] Needed?: No      Activities of Daily Living    03/18/2022    2:00 PM 03/07/2022    7:11 PM  In your present state of health, do you have any difficulty performing the following activities:  Hearing? _0 Vision? 0 0   0   0  Difficulty concentrating or making decisions? 0 0   0   0  Walking or climbing stairs? _1 Dressing or bathing? 0 0   0   0  Doing errands, shopping? _2 Preparing Food and eating ? N N   N   N  Using the Toilet? N N   N   N  In the past six months, have you accidently leaked urine? N N   N   N  Do you have problems with loss of bowel control? N N   N   N  Managing your Medications? N N   N   N  Managing your Finances? Chaney Born  Housekeeping or managing your Housekeeping? Carloyn Manner   N    Patient Care Team: [name], MD as PCP - General (Internal Medicine) Harl Bowie Alphonse Guild, MD as PCP - Cardiology (Cardiology) Brien Mates, RN as Oncology Nurse Navigator (Oncology) Kassie Mends, RN as [place] Management Beryle Lathe, [facility] (Inactive) (Pharmacist)  Indicate any recent Medical Services you may have received from other than [facility] providers in the past year (date may be approximate).     Assessment:   This is a routine wellness examination for [place].  Hearing/Vision screen No results found.  Dietary issues and exercise activities discussed:     Goals Addressed   None    Depression Screen    03/17/2022   11:02 AM 09/16/2021   11:19 AM 08/04/2021   11:23 AM 07/02/2021   11:32 AM 04/16/2021    8:10 AM 03/09/2021    1:19 PM 02/18/2021    3:07 PM  PHQ 2/9 Scores  PHQ - 2 Score 0 0 0 0 0 0 0  PHQ- 9 Score    0       Fall Risk    03/18/2022    2:00 PM 03/17/2022   11:02 AM 03/07/2022    7:11 PM 09/16/2021   11:19 AM 08/04/2021    11:24 AM  Fall Risk   Falls in the past year? 1 0 _3 0 1  Number falls in past yr: 1 0 _4 0 1  Injury with Fall? 0 0 0   0   0 0 0  Risk for fall due to :  No Fall Risks  No Fall Risks   Follow up  Falls evaluation completed  Falls evaluation completed     [place]  PERTAINING TO THE HOME:  Any stairs in or around the home? No  If so, are there any without handrails? No  Home free of loose throw rugs in walkways, pet beds, electrical cords, etc? Yes  Adequate lighting in your home to reduce risk of falls? Yes   ASSISTIVE DEVICES UTILIZED TO PREVENT FALLS:  Life alert? No  Use of a cane, walker or w/c? Yes  Grab bars in the bathroom? Yes  Shower chair or bench in shower? Yes  Elevated toilet seat or a handicapped toilet? Yes   Cognitive Function:        03/22/2022   10:43 AM 01/01/2020   10:29 AM  6CIT Screen  What Year? 0 points 0 points  What month? 0 points 0 points  What time? 0 points 0 points  Count back from 20 0 points 0 points  Months in reverse 2 points 0 points  Repeat phrase 0 points 0 points  Total Score 2 points 0 points    Immunizations Immunization History  Administered Date(s) Administered   Fluad Quad(high Dose 65+) 06/04/2021   Influenza Nasal 06/23/2020   Influenza, High Dose Seasonal PF 05/30/2019, 06/04/2021   Influenza-Unspecified 06/21/2017, 07/03/2018, 05/30/2019, 06/23/2020   PFIZER Comirnaty(Gray Top)Covid-19 Tri-Sucrose Vaccine 02/01/2022   PFIZER(Purple Top)SARS-COV-2 Vaccination 11/11/2019, 12/05/2019, 07/03/2020, 07/03/2020   Pneumococcal Conjugate-13 11/04/2021   Pneumococcal Polysaccharide-23 06/04/2019   Tdap 04/18/2014   Zoster Recombinat (Shingrix) 11/24/2021, 03/08/2022   Zoster, Live 06/26/2013    TDAP status: Up to date  Flu Vaccine status: Up to date  Pneumococcal vaccine status: Up to date  Covid-19 vaccine status: Completed vaccines  Qualifies for Shingles Vaccine? Yes   Zostavax  completed No   Shingrix Completed?: Yes  Screening Tests Health Maintenance  Topic Date Due   COLONOSCOPY (Pts 45-30yr Insurance coverage will need to be confirmed)  09/09/2020   COVID-19 Vaccine (6 - Booster for Pfizer series) 03/29/2022   INFLUENZA VACCINE  04/20/2022   TETANUS/TDAP  04/18/2024   Pneumonia Vaccine 76 Years old  Completed   Hepatitis C Screening  Completed   Zoster Vaccines- Shingrix  Completed   HPV VACCINES  Aged Out    Health Maintenance  Health Maintenance Due  Topic Date Due   COLONOSCOPY (Pts 45-487yrInsurance coverage will need to be confirmed)  09/09/2020    Colorectal cancer screening: Referral to GI placed pt declines at this time . Pt aware the office will call re: appt.  Lung Cancer Screening: (Low Dose CT Chest recommended if Age 791-80ears, 30 pack-year currently smoking OR have quit w/in 15years.) does not qualify.   Lung Cancer Screening Referral: na  Additional Screening:  Hepatitis C Screening: does not qualify; Completed na  Vision Screening: Recommended annual ophthalmology exams for early detection of glaucoma and other disorders of the eye. Is the patient up to date with their annual eye exam?  Yes  Who is the provider or what is the name of the office in which the patient attends annual eye exams?  If pt is not established with a provider, would they like to be referred to a provider to establish care? No .   Dental Screening: Recommended annual dental exams for proper oral hygiene  Community Resource Referral / Chronic Care Management: CRR required this visit?  No   CCM required this visit?  No      Plan:     I have personally reviewed and noted the following in the patient's chart:   Medical  and social history Use of alcohol, tobacco or illicit drugs  Current medications and supplements including opioid prescriptions. Patient is not currently taking opioid prescriptions. Functional ability and status Nutritional  status Physical activity Advanced directives List of other physicians Hospitalizations, surgeries, and ER visits in previous 12 months Vitals Screenings to include cognitive, depression, and falls Referrals and appointments  In addition, I have reviewed and discussed with patient certain preventive protocols, quality metrics, and best practice recommendations. A written personalized care plan for preventive services as well as general preventive health recommendations were provided to patient.     Eual Fines, LPN   3/0/1237   Nurse Notes: Schedule your next wellness visit for 1 year at checkout

## 2022-03-22 NOTE — Patient Instructions (Addendum)
  Darius Smith , Thank you for taking time to come for your Medicare Wellness Visit. I appreciate your ongoing commitment to your health goals. Please review the following plan we discussed and let me know if I can assist you in the future.    The only thing you are overdue for is colonoscopy but you wish to wait until current treatments are finished and that is fine.   These are the goals we discussed:  Goals      Medication Management     Patient Goals/Self-Care Activities Over the next 90 days, patient will:  Take medications as prescribed Check blood sugar continuously with continuous glucose monitor, document, and provide at future appointments Check blood pressure at least once daily, document, and provide at future appointments Weigh daily, and contact provider if weight gain of more than 3 lbs in 1 day or more than 5 lbs in 1 week       Patient Stated     I would like to continue to walk 2.5- 5 miles per day      Prevent falls        This is a list of the screening recommended for you and due dates:  Health Maintenance  Topic Date Due   Colon Cancer Screening  09/09/2020   COVID-19 Vaccine (6 - Booster for Pfizer series) 03/29/2022   Flu Shot  04/20/2022   Tetanus Vaccine  04/18/2024   Pneumonia Vaccine  Completed   Hepatitis C Screening: USPSTF Recommendation to screen - Ages 18-79 yo.  Completed   Zoster (Shingles) Vaccine  Completed   HPV Vaccine  Aged Out

## 2022-04-06 DIAGNOSIS — D485 Neoplasm of uncertain behavior of skin: Secondary | ICD-10-CM | POA: Diagnosis not present

## 2022-04-06 DIAGNOSIS — L570 Actinic keratosis: Secondary | ICD-10-CM | POA: Diagnosis not present

## 2022-04-06 DIAGNOSIS — L820 Inflamed seborrheic keratosis: Secondary | ICD-10-CM | POA: Diagnosis not present

## 2022-04-07 DIAGNOSIS — C8510 Unspecified B-cell lymphoma, unspecified site: Secondary | ICD-10-CM | POA: Diagnosis not present

## 2022-04-07 DIAGNOSIS — I4891 Unspecified atrial fibrillation: Secondary | ICD-10-CM | POA: Diagnosis not present

## 2022-04-07 DIAGNOSIS — Z7901 Long term (current) use of anticoagulants: Secondary | ICD-10-CM | POA: Diagnosis not present

## 2022-04-07 DIAGNOSIS — N183 Chronic kidney disease, stage 3 unspecified: Secondary | ICD-10-CM | POA: Diagnosis not present

## 2022-04-07 DIAGNOSIS — Z7952 Long term (current) use of systemic steroids: Secondary | ICD-10-CM | POA: Diagnosis not present

## 2022-04-07 DIAGNOSIS — C8219 Follicular lymphoma grade II, extranodal and solid organ sites: Secondary | ICD-10-CM | POA: Diagnosis not present

## 2022-04-07 DIAGNOSIS — I130 Hypertensive heart and chronic kidney disease with heart failure and stage 1 through stage 4 chronic kidney disease, or unspecified chronic kidney disease: Secondary | ICD-10-CM | POA: Diagnosis not present

## 2022-04-07 DIAGNOSIS — Z7961 Long term (current) use of immunomodulator: Secondary | ICD-10-CM | POA: Diagnosis not present

## 2022-04-07 DIAGNOSIS — Z95828 Presence of other vascular implants and grafts: Secondary | ICD-10-CM | POA: Diagnosis not present

## 2022-04-07 DIAGNOSIS — I509 Heart failure, unspecified: Secondary | ICD-10-CM | POA: Diagnosis not present

## 2022-04-07 DIAGNOSIS — Z85828 Personal history of other malignant neoplasm of skin: Secondary | ICD-10-CM | POA: Diagnosis not present

## 2022-04-07 DIAGNOSIS — Z801 Family history of malignant neoplasm of trachea, bronchus and lung: Secondary | ICD-10-CM | POA: Diagnosis not present

## 2022-04-07 DIAGNOSIS — Z803 Family history of malignant neoplasm of breast: Secondary | ICD-10-CM | POA: Diagnosis not present

## 2022-04-07 DIAGNOSIS — Z8042 Family history of malignant neoplasm of prostate: Secondary | ICD-10-CM | POA: Diagnosis not present

## 2022-04-10 ENCOUNTER — Encounter: Payer: Self-pay | Admitting: Internal Medicine

## 2022-04-14 DIAGNOSIS — R739 Hyperglycemia, unspecified: Secondary | ICD-10-CM | POA: Diagnosis not present

## 2022-04-14 DIAGNOSIS — T380X5A Adverse effect of glucocorticoids and synthetic analogues, initial encounter: Secondary | ICD-10-CM | POA: Diagnosis not present

## 2022-04-14 DIAGNOSIS — I4891 Unspecified atrial fibrillation: Secondary | ICD-10-CM | POA: Diagnosis not present

## 2022-04-14 DIAGNOSIS — Z0001 Encounter for general adult medical examination with abnormal findings: Secondary | ICD-10-CM | POA: Diagnosis not present

## 2022-04-15 LAB — LIPID PANEL
Chol/HDL Ratio: 2.6 ratio (ref 0.0–5.0)
Cholesterol, Total: 120 mg/dL (ref 100–199)
HDL: 46 mg/dL (ref >39)
LDL Chol Calc (NIH): 60 mg/dL (ref 0–99)
Triglycerides: 68 mg/dL (ref 0–149)
VLDL Cholesterol Cal: 14 mg/dL (ref 5–40)

## 2022-04-15 LAB — TSH+FREE T4
Free T4: 1.71 ng/dL (ref 0.82–1.77)
TSH: 0.759 u[IU]/mL (ref 0.450–4.500)

## 2022-04-15 LAB — HEMOGLOBIN A1C
Est. average glucose Bld gHb Est-mCnc: 97 mg/dL
Hgb A1c MFr Bld: 5 % (ref 4.8–5.6)

## 2022-04-15 LAB — PSA: Prostate Specific Ag, Serum: 3.1 ng/mL (ref 0.0–4.0)

## 2022-05-04 ENCOUNTER — Other Ambulatory Visit: Payer: Medicare Other | Admitting: Nurse Practitioner

## 2022-05-04 ENCOUNTER — Encounter: Payer: Self-pay | Admitting: Nurse Practitioner

## 2022-05-04 DIAGNOSIS — Z515 Encounter for palliative care: Secondary | ICD-10-CM

## 2022-05-04 DIAGNOSIS — R630 Anorexia: Secondary | ICD-10-CM

## 2022-05-04 DIAGNOSIS — R0602 Shortness of breath: Secondary | ICD-10-CM | POA: Diagnosis not present

## 2022-05-04 NOTE — Progress Notes (Signed)
[facility] Consult Note Telephone: [phone]  Fax: [phone]    Date of encounter: 05/04/22 2:55 PM PATIENT NAME: Darius Smith   [phone] (home)  DOB: 01/25/1946 MRN: [id number] PRIMARY CARE PROVIDER:    [name], MD,  [address] [place] [address] [phone]  REFERRING PROVIDER:   [name], MD [address] [place],  [place] [address] [phone]  RESPONSIBLE PARTY:    Contact Information     Name Relation Home Work Mobile   Darius, Smith [phone]  [phone]      [phone]   [phone]         Due to the COVID-19 crisis, this visit was done via telemedicine from my office and it was initiated and consent by this patient and or family.   I connected with Darius Smith with Darius Smith [facility] on 01/28/22 by a telephone as video not available enabled telemedicine application and verified that I am speaking with the correct person using two identifiers.   I discussed the limitations of evaluation and management by telemedicine. The patient expressed understanding and agreed to proceed.  Palliative Care was asked to follow this patient by consultation request of  [name], MD to address advance care planning and complex medical decision making. This is a follow up visit.                                  ASSESSMENT AND PLAN / RECOMMENDATIONS:  Symptom Management/Plan: 1. Advance Care Planning; DNR in vynca   2. Shortness of breath secondary to CHF, stable, continue to monitor weights; edema; continue with cardiology; stable echo repeated from 20 to 25% to 50%.    3. Anorexia improved, secondary to lymphoma with brain mets currently in remission, continued chemotherapy  with diagnostic testing. Monitor for side effects, manage symptoms currently asymptomatic; discussed nutrition 12/16/2021 weight 181 lbs with BMI 24.54 04/07/2022 weight 180  lbs with BMI 24.53 4. Palliative care encounter; Palliative care encounter; Palliative medicine team will continue to support patient, patient's family, and medical team. Visit consisted of counseling and education dealing with the complex and emotionally intense issues of symptom management and palliative care in the setting of serious and potentially life-threatening illness   Follow up Palliative Care Visit: Palliative care will continue to follow for complex medical decision making, advance care planning, and clarification of goals. Return 8 weeks or prn.   I spent 18 minutes providing this consultation. More than 50% of the time in this consultation was spent in counseling and care coordination. PPS: 50%   Chief Complaint: Follow up palliative consult for complex medical decision making   HISTORY OF PRESENT ILLNESS:  Darius Smith is a 76 y.o. year old male  with multiple medical problems including Cardiomegaly with ef 20 to 25%, atrial fibrillation, lymphoma, hypertension, chronic systolic congestive heart failure, gerd, chronic kidney disease, history of kidney stone, arthritis, cholecystectomy, history of cardioversion. I called Darius Smith for telemedicine telephonic as video not available for [facility] visit. We talked about purpose of visit. Ms. Smith in agreement. We talked about how Darius Smith has been feeling. Darius Smith endorses Darius Smith doing well. We talked about recent appointments with Oncology, Cardiology with changes. We talked about ongoing treatments with diagnostic testing. We talked about ros, appetite, no functional or cognitive  decline. We talked about further plan, MD appointments coming up. Medical goals reviewed. We talked about importance of quality of life. Darius Sleeth talked about a beach trip they recently took, having a room overlooking the ocean where Darius Millirons was able to enjoy it. We reviewed weights, bp's, medications. We talked about role pc in poc. Darius. [place]oux in agreement.  Questions answered. Therapeutic listening, emotional support provided.   History obtained from review of EMR, discussion with Darius and  Darius. [place].  I reviewed available labs, medications, imaging, studies and related documents from the EMR.  Records reviewed and summarized above.    ROS 10 point system reviewed all negative except HPI   Physical Exam: deferred  Thank you for the opportunity to participate in the care of Darius. [place]t.  The palliative care team will continue to follow. Please call our office at [phone] if we can be of additional assistance.   Devaunte Gasparini [name], NP

## 2022-05-07 ENCOUNTER — Ambulatory Visit (INDEPENDENT_AMBULATORY_CARE_PROVIDER_SITE_OTHER): Payer: Medicare Other | Admitting: *Deleted

## 2022-05-07 ENCOUNTER — Other Ambulatory Visit: Payer: Self-pay | Admitting: Cardiology

## 2022-05-07 DIAGNOSIS — E119 Type 2 diabetes mellitus without complications: Secondary | ICD-10-CM

## 2022-05-07 DIAGNOSIS — I5022 Chronic systolic (congestive) heart failure: Secondary | ICD-10-CM

## 2022-05-07 NOTE — Patient Instructions (Signed)
Visit Information  Thank you for taking time to visit with me today. Please don't hesitate to contact me if I can be of assistance to you before our next scheduled telephone appointment.  Following are the goals we discussed today:  Take medications as prescribed   Attend all scheduled provider appointments Call pharmacy for medication refills 3-7 days in advance of running out of medications Call provider office for new concerns or questions  do ankle pumps when sitting keep legs up while sitting watch for swelling in feet, ankles and legs every day eat more whole grains, fruits and vegetables, lean meats and healthy fats check feet daily for cuts, sores or redness enter blood sugar readings and medication or insulin into daily log take the blood sugar log to all doctor visits take the blood sugar meter to all doctor visits fill half of plate with vegetables read food labels for fat, fiber, carbohydrates and portion size keep feet up while sitting wash and dry feet carefully every day wear comfortable, cotton socks Continue to weigh daily and record Call your doctor for 3 pound weight gain overnight or 5 pound weight gain in one week Continue to monitor blood sugar and record, take log to doctor's visit Follow carbohydrate modified diet Follow low sodium diet- read food labels, limit fast food Practice good handwashing and wear a mask as needed and avoid sick persons Alternate activity with rest, walk when you can Continue to work with palliative care and call them for questions or concerns Continue to follow up with ENT doctor about any further issues with hearing Case closure today  please talk with your doctor for any future care management needs  Please call the care guide team at [phone] if you need to cancel or reschedule your appointment.   If you are experiencing a Mental Health or [place] or need someone to talk to, please call the Suicide and Crisis  Lifeline: 988 call the [place] National Suicide Prevention Lifeline: 417-556-7760 or TTY: 240-169-3130 TTY 934-507-8168) to talk to a trained counselor call 1-800-273-TALK (toll free, 24 hour hotline) go to [facility] [address], [place] [phone]) call 911   Patient verbalizes understanding of instructions and care plan provided today and agrees to view in [place]. Active MyChart status and patient understanding of how to access instructions and care plan via MyChart confirmed with patient.     Jacqlyn Larsen [facility], BSN RN Case Manager [facility] [phone]

## 2022-05-07 NOTE — Chronic Care Management (AMB) (Signed)
Chronic Care Management   CCM RN Visit Note  05/07/2022 Name: Darius Smith MRN: [id number] DOB: 01-15-1946  Subjective: Darius Smith is a 76 y.o. year old male who is a primary care patient of Darius Spar, MD. The care management team was consulted for assistance with disease management and care coordination needs.    Engaged with patient by telephone for follow up visit in response to provider referral for case management and/or care coordination services.   Consent to Services:  The patient was given information about Chronic Care Management services, agreed to services, and gave verbal consent prior to initiation of services.  Please see initial visit note for detailed documentation.   Patient agreed to services and verbal consent obtained.   Assessment: Review of patient past medical history, allergies, medications, health status, including review of consultants reports, laboratory and other test data, was performed as part of comprehensive evaluation and provision of chronic care management services.   SDOH (Social Determinants of Health) assessments and interventions performed:    CCM Care Plan  Allergies  Allergen Reactions   Flomax [Tamsulosin Hcl] Nausea Only    Dizziness  Pt is on flomax   Heparin Other (See Comments)    Tingling in face and shortness of breath    Outpatient Encounter Medications as of 05/07/2022  Medication Sig Note   acetaminophen (TYLENOL) 500 MG tablet Take 1,000 mg by mouth every 6 (six) hours as needed for moderate pain or headache.    amiodarone (PACERONE) 200 MG tablet TAKE 1 TABLET BY MOUTH ONCE A DAY.    amLODipine (NORVASC) 10 MG tablet TAKE 1 TABLET BY MOUTH ONCE DAILY.    apixaban (ELIQUIS) 5 MG TABS tablet TAKE ONE TABLET BY MOUTH 2 TIMES A DAY.    blood glucose meter kit and supplies KIT Inject 1 each into the skin 4 (four) times daily -  before meals and at bedtime. Dispense based on patient and insurance preference. Use up to four  times daily as directed.    cetirizine (ZYRTEC) 10 MG tablet Take 1 tablet (10 mg total) by mouth daily as needed for allergies.    Continuous Blood Gluc Receiver (FREESTYLE LIBRE 2 READER) DEVI Use to monitor blood glucose continuously    Continuous Blood Gluc Sensor (FREESTYLE LIBRE 2 SENSOR) MISC USE TO MONITOR BLOOD SUGAR DAILY. (CHANGE EVERY 2WEEKS)    furosemide (LASIX) 20 MG tablet Take 1 tablet (20 mg total) by mouth every other day. (May take an additional tab as needed.)    glucose blood (ONETOUCH ULTRA) test strip 1 each by Other route 4 (four) times daily.    hydrALAZINE (APRESOLINE) 100 MG tablet TAKE (1) TABLET BY MOUTH (3) TIMES DAILY.    insulin lispro (HUMALOG KWIKPEN) 100 UNIT/ML KwikPen Inject 1-10 Units into the skin 4 (four) times daily -  before meals and at bedtime. Blood sugars 70-120: 1 unit 121-150: 2 units 151-200: 3 units 201-250: 4 units 251-300: 5 units 301-350: 6 units 351-400: 9 units More than 400, 10 units and call your doctor 07/16/2021: Patient only using if blood glucose >180 per instructions from Dr. Moshe Cipro   Insulin Pen Needle 29G X 12MM MISC 1 each by Does not apply route 4 (four) times daily -  before meals and at bedtime.    isosorbide mononitrate (IMDUR) 30 MG 24 hr tablet TAKE 1/2 TABLET BY MOUTH ONCE DAILY.    Lancets (ONETOUCH DELICA PLUS MWNUUV25D) MISC 1 each by Other route 4 (four)  times daily.    lenalidomide (REVLIMID) 10 MG capsule Take 10 mg by mouth. Take 10 mg on days 1-21 of a 28 day cycle.    omeprazole (PRILOSEC) 20 MG capsule Take 1 capsule (20 mg total) by mouth daily as needed (reflux).    potassium chloride SA (KLOR-CON M) 20 MEQ tablet TAKE ONE TABLET BY MOUTH ONCE DAILY.    VENTOLIN HFA 108 (90 Base) MCG/ACT inhaler Inhale 2 puffs into the lungs every 6 (six) hours as needed for wheezing or shortness of breath.    No facility-administered encounter medications on file as of 05/07/2022.    Patient Active Problem List   Diagnosis Date  Noted   Mixed conductive and sensorineural hearing loss of both ears 03/17/2022   Encounter for general adult medical examination with abnormal findings 03/17/2022   Prostate cancer screening 03/17/2022   Steroid-induced hyperglycemia 03/17/2022   CHF (congestive heart failure) ([place]) 06/24/2021   Brain metastases    Hyperkalemia 06/01/2021   Secondary hypercoagulable state ([place]) 08/27/2020   DNR (do not resuscitate) discussion    Stage 3b chronic kidney disease ([place]) 26/41/5830   Chronic systolic HF (heart failure) ([place]) 94/03/6807   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   Atrial fibrillation ([place]) 03/20/2020   Essential hypertension 08/08/2014   GERD (gastroesophageal reflux disease) 08/08/2014    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  Care Plan : RN Care Manager plan of care  Updates made by Kassie Mends, RN since 05/07/2022 12:00 AM  Completed 05/07/2022   Problem: No plan of care established for management of chronic disease states (CHF, cancer, A-fib, HTN) Resolved 05/07/2022  Priority: High     Long-Range Goal: Development of plan of care for chronic disease management (CHF, cancer, A-fib, HTN) Completed 05/07/2022  Start Date: 08/04/2021  Expected End Date: 06/13/2022  Priority: High  Note:   Current Barriers:  Knowledge Deficits related to plan of care for management of CHF and Cancer (follicular lymphoma)  Patient lives with wife [name] and she provides oversight for all of patient's care, they have adult children.  [name] reports pt requires assistance with bathing, walking, cooking, etc, has needed DME in the home and uses walker and cane for ambulation.  Patient sees oncologist at [facility] and had MRI to further assess plan of care for cancer, started chemotherapy infusions and had 4 treatments total in October and November and will restart 09/30/21 and continues Revlimid oral med 21 days on and 7 days off, per spouse, pt then had brain MRI and full body PET scan  on 08/2021 and states there is "improvement in different parts of his body". Update- pt had MRI and PET scan 12/02/21 and cancer is in remission per spouse, will be continuing q 8 weeks and MRI/ PET scan scheduled for q 12 weeks for 18 months.  Pt is eating well, has all medications and taking as prescribed, denies pain and nausea. Patient does try to walk through the house to stay active, is sleeping well, did receive flu vaccine.  Wife is very supportive and very involved with patient's care. Patient does weigh daily and record, weight ranges 176-178 pounds.  Patient now has [employer] (when receiving steroids with chemotherapy increases blood sugar) spouse reports fasting readings 78-94 ranges, random ranges 90-100. Patient went to ED 09/26/21 with Covid and is overall better now except for a feeling of "water and popping in ears and can't hardly hear anything"  Spouse reports PA at cancer center at [facility] assessed  ears and saw buildup of wax and patient used Milta Deiters Med drops but this did not help, spouse ask if pt should see ENT or report to primary care provider. Per spouse, Debrox drops did not help with hearing and feeling of water in the ears, pt saw audiologist at [facility] and had hearing tests completed and hearing loss confirmed and referred to the "medical side for hearing issues and may have tubes placed in ear because there is fluid in the ear", left ear is affected. 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  Palliative care continues outreach with most recent outreach 01/28/22. RNCM Clinical Goal(s):  Patient/ spouse will verbalize understanding of disease states CHF, Cancer as evidenced  by pt/ spouse report, review of EHR,  through collaboration with RN Care manager, provider, and care team.  Patient will take all medications as prescribed as evidenced by pt/ spouse report, review of EHR and collaboration with CCM pharmacist. 05/07/22- spoke with spouse Dabney Schanz who reports pt is "doing very well, mowed the yard this morning",  continues weighing daily with weight today 175 pounds, checks CBG several times per week with fasting ranges 90-94, continues every 8 week chemotherapy schedule.  Pt to have brain MRI and full body PET scan, lab work and another chemotherapy treatment and a conference with several doctors (regarding plan of care) on 06/02/22. Palliative care continues to follow up, spouse states found that patient's ear bones did not develop correctly and would need surgery to correct, pt declined and spouse states " hearing aides will not help this problem" no new concerns reported today.  Interventions: 1:1 collaboration with primary care provider regarding development and update of comprehensive plan of care as evidenced by provider attestation and co-signature Inter-disciplinary care team collaboration (see longitudinal plan of care) Evaluation of current treatment plan related to  self management and patient's adherence to plan as established by provider  Heart Failure Interventions: Discussed importance of daily weight and advised patient to weigh and record daily Discussed the importance of keeping all appointments with provider Reinforced heart failure action plan and importance of calling doctor early on for change in health status, symptoms with emphasis on yellow zone Reinforced low sodium diet and food choices Reinforced importance of taking medications as prescribed Plan of care reviewed with patient's wife including case closure  Oncology:  (Status: New goal.) Assessment of understanding of oncology diagnosis:  Assessed patient understanding of cancer  diagnosis and recommended treatment plan and Reviewed upcoming provider appointments and treatment appointments  Reinforced infection precautions and importance of staying well, handwashing, wearing a mask as needed, avoiding sick persons Reinforced energy conservation- alternating activity with rest Assessed with spouse that patient does have palliative care currently- nurse continues to call every few months Reviewed upcoming scheduled appointments Reinforced drinking adequate fluids and eating healthy diet Pain assessment completed Encouraged to continue follow up with ENT for any further issues with your ears  Diabetes Interventions:  (Status:  New goal. and Goal on track:  Yes.) Long Term Goal Assessed patient's understanding of A1c goal: <7% Reviewed medications with patient and discussed importance of medication adherence Counseled on importance of regular laboratory monitoring  as prescribed Provided patient with written educational materials related to hypo and hyperglycemia and importance of correct treatment Review of patient status, including review of consultants reports, relevant laboratory and other test results, and medications completed Reviewed carbohydrate modified diet Lab Results  Component Value Date   HGBA1C 6.5 (H) 06/01/2021     Patient Goals/Self-Care Activities: Take medications as prescribed   Attend all scheduled provider appointments Call pharmacy for medication refills 3-7 days in advance of running out of medications Call provider office for new concerns or questions  do ankle pumps when sitting keep legs up while sitting watch for swelling in feet, ankles and legs every day eat more whole grains, fruits and vegetables, lean meats and healthy fats check feet daily for cuts, sores or redness enter blood sugar readings and medication or insulin into daily log take the blood sugar log to all doctor visits take the blood sugar meter to all doctor  visits fill half of plate with vegetables read food labels for fat, fiber, carbohydrates and portion size keep feet up while sitting wash and dry feet carefully every day wear comfortable, cotton socks Continue to weigh daily and record Call your doctor for 3 pound weight gain overnight or 5 pound weight gain in one week Continue to monitor blood sugar and record, take log to doctor's visit Follow carbohydrate modified diet Follow low sodium diet- read food labels, limit fast food Practice good handwashing and wear a mask as needed and avoid sick persons Alternate activity with rest, walk when you can Continue to work with palliative care and call them for questions or concerns Continue to follow up with ENT doctor about any further issues with hearing Case closure today  please talk with your doctor for any future care management needs         Plan:No further follow up required: case closure  Jacqlyn Larsen [facility], BSN RN Case Manager [facility] [phone]

## 2022-05-11 ENCOUNTER — Telehealth: Payer: Medicare Other

## 2022-05-18 ENCOUNTER — Encounter: Payer: Self-pay | Admitting: Cardiology

## 2022-05-18 ENCOUNTER — Ambulatory Visit: Payer: Medicare Other | Attending: Cardiology | Admitting: Cardiology

## 2022-05-18 VITALS — BP 108/56 | HR 52 | Ht 72.0 in | Wt 180.6 lb

## 2022-05-18 DIAGNOSIS — I10 Essential (primary) hypertension: Secondary | ICD-10-CM

## 2022-05-18 DIAGNOSIS — D6869 Other thrombophilia: Secondary | ICD-10-CM | POA: Diagnosis not present

## 2022-05-18 DIAGNOSIS — I5022 Chronic systolic (congestive) heart failure: Secondary | ICD-10-CM

## 2022-05-18 DIAGNOSIS — I4891 Unspecified atrial fibrillation: Secondary | ICD-10-CM

## 2022-05-18 NOTE — Progress Notes (Signed)
Clinical Summary Darius Smith is a 76 y.o.male seen today for follow up of the following medical problems.    1. Chronic systolic HF - 0/9604 echo LVEF 20-25%. New diagnosis of systolic HF at that time.  - recent admission 06/2020 with volume overload. BNP  2000s, CXR with edema.  - diuresis was limited by elevation in Cr - with elevation in Cr we stopped his entresto, started hydral/imdur.   - systolic dysfunction thought to be tachy mediated as significant issues with afib with RVR at the same time. - have not pursued ischemic testing as of yet, repeat echo few months after afib controlled and pending renal function     - 11/2020 echo LVEF 50%  06/2021 echo LVEF 50-55% [place]     - no SOB/DOE, no LE edema. - home weights stable 173-175 lbs and stable, home weights different from ours - compliant with meds. Toprol caused bradycardia, we retried even low dose and rates to 40s.  - walks up to 1.5 miles per day, tolerates well.    2. Persistent afib - dynamaps do not accuratelty measure his HRs, best measured by EKG>  - issues with elevated HRs recently including during recent admission - difficulty controlling with beta blocker alone, avoiding CCB due to systolic dysfunction, renal function and HF limit antiarrhythmic options. During admission we started amiodarone - failed attempt at [place] 07/16/20, plan to continue oral load and retry in 3 weeks.    - on amio, DCCVs on 12/2 and 09/10/20, last 09/10/20 conversion was succesful. - has been maintaining SR at follow ups.   -no recent symptoms - off toprol again due to low HRs after retrying low dose     3. Stage IV lymphoma - from onc notes just monitoring at this time, in absence of symptoms no indciation for therapy.  - I did hear back from oncology, they report his lymphoma is not curable and treatment only helps symptoms. Prognosis can be a few years to a few decades, and they do not see a contraindciation to proceeding  with cath in the future if needed   - on chemo at [facility] - He has had a great treatment response with most recent PET CT and MRI brain on 03/03/22 showing a complete response.    4. HTN - compliant with meds     5. 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 Prior imaging had shown brain lesions.  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 COVID - +infection Jan 2023 - managed at home on paxlovid   Wife is former Set designer at [employer]. Very good at keeping track of his weights, vitals etc.    Past Medical History:  Diagnosis Date   Allergic rhinitis 07/02/2019   Allergy 2015   Arthritis    both hands   Atrial fibrillation ([place]) 10/28/2017   Dr. Harl Bowie   Atrial fibrillation with RVR ([place]) 07/14/2020   Blood in urine    occ   Bronchitis    Cancer ([place])  Basal cell   Cardiomegaly 10/28/2017   CHF (congestive heart failure) (HCC)    Clotting disorder ([place])    Complication of anesthesia    Mr. Fandino developed a. fib in recovery after cysto procedure 10/2017 was transferred to [facility] and underwent successful cardioversion   Diabetes [facility])    Diverticulosis of sigmoid colon 08/2017   Noted on colonoscopy   Dysrhythmia    External hemorrhoids 08/2017   GERD (gastroesophageal reflux disease)    History of colon polyps 08/2017   History of kidney stones    History of rectal bleeding    History of right inguinal hernia    Hypertension    Lung cancer  ([place]) 06/25/2020   Lymphoma ([place])    Nephrolithiasis 07/02/2019   Rectal bleeding 09/02/2017   Added automatically from request for surgery [phone]   Shortness of breath    Stage 3 chronic kidney disease (HCC)      Allergies  Allergen Reactions   Flomax [Tamsulosin Hcl] Nausea Only    Dizziness  Pt is on flomax   Heparin Other (See Comments)    Tingling in face and shortness of breath     Current Outpatient Medications  Medication Sig Dispense Refill   acetaminophen (TYLENOL) 500 MG tablet Take 1,000 mg by mouth every 6 (six) hours as needed for moderate pain or headache.     amiodarone (PACERONE) 200 MG tablet TAKE 1 TABLET BY MOUTH ONCE A DAY. 30 tablet 9   amLODipine (NORVASC) 10 MG tablet TAKE 1 TABLET BY MOUTH ONCE DAILY. 90 tablet 3   apixaban (ELIQUIS) 5 MG TABS tablet TAKE ONE TABLET BY MOUTH 2 TIMES A DAY. 60 tablet 11   blood glucose meter kit and supplies KIT Inject 1 each into the skin 4 (four) times daily -  before meals and at bedtime. Dispense based on patient and insurance preference. Use up to four times daily as directed. 1 each 0   cetirizine (ZYRTEC) 10 MG tablet Take 1 tablet (10 mg total) by mouth daily as needed for allergies. 90 tablet 2   Continuous Blood Gluc Receiver (FREESTYLE LIBRE 2 READER) DEVI Use to monitor blood glucose continuously     Continuous Blood Gluc Sensor (FREESTYLE LIBRE 2 SENSOR) MISC USE TO MONITOR BLOOD SUGAR DAILY. (CHANGE EVERY 2WEEKS) 1 each 3   furosemide (LASIX) 20 MG tablet Take 1 tablet (20 mg total) by mouth every other day. (May take an additional tab as needed.)     glucose blood (ONETOUCH ULTRA) test strip 1 each by Other route 4 (four) times daily. 100 each 3   hydrALAZINE (APRESOLINE) 100 MG tablet TAKE (1) TABLET BY MOUTH (3) TIMES DAILY. 270 tablet 1   insulin lispro (HUMALOG KWIKPEN) 100 UNIT/ML KwikPen Inject 1-10 Units into the skin 4 (four) times daily -  before meals and at bedtime. Blood sugars 70-120: 1 unit 121-150:  2 units 151-200: 3 units 201-250: 4 units 251-300: 5 units 301-350: 6 units 351-400: 9 units More than 400, 10 units and call your doctor 15 mL 11   Insulin Pen Needle 29G X 12MM MISC 1 each by Does not apply route 4 (four) times daily -  before meals and at bedtime. 100 each 0   isosorbide mononitrate (IMDUR) 30 MG 24 hr tablet TAKE 1/2 TABLET BY MOUTH ONCE DAILY. 45 tablet 3   Lancets (ONETOUCH DELICA PLUS [id number]) MISC 1 each by Other route 4 (four) times daily.     lenalidomide (REVLIMID) 10  MG capsule Take 10 mg by mouth. Take 10 mg on days 1-21 of a 28 day cycle.     omeprazole (PRILOSEC) 20 MG capsule Take 1 capsule (20 mg total) by mouth daily as needed (reflux). 90 capsule 1   potassium chloride SA (KLOR-CON M) 20 MEQ tablet TAKE ONE TABLET BY MOUTH ONCE DAILY. 30 tablet 3   VENTOLIN HFA 108 (90 Base) MCG/ACT inhaler Inhale 2 puffs into the lungs every 6 (six) hours as needed for wheezing or shortness of breath. 18 g 3   No current facility-administered medications for this visit.     Past Surgical History:  Procedure Laterality Date   AXILLARY LYMPH NODE BIOPSY Left 06/05/2021   Procedure: AXILLARY LYMPH NODE BIOPSY;  Surgeon: Virl Cagey, MD;  Location: [facility];  Service: General;  Laterality: Left;   CARDIOVERSION     10/2017   CARDIOVERSION N/A 07/16/2020   Procedure: CARDIOVERSION;  Surgeon: Arnoldo Lenis, MD;  Location: [facility];  Service: Endoscopy;  Laterality: N/A;   CARDIOVERSION N/A 08/21/2020   Procedure: CARDIOVERSION;  Surgeon: Satira Sark, MD;  Location: [facility];  Service: Cardiovascular;  Laterality: N/A;   CARDIOVERSION N/A 09/10/2020   Procedure: CARDIOVERSION;  Surgeon: Josue Hector, MD;  Location: [facility] ENDOSCOPY;  Service: Cardiovascular;  Laterality: N/A;   CHOLECYSTECTOMY     CLEFT LIP REPAIR     several from childhood til 76 years old   [place]     several from childhood til 76 years old   COLONOSCOPY  04/28/2011    Procedure: COLONOSCOPY;  Surgeon: Rogene Houston, MD;  Location: [facility];  Service: Endoscopy;  Laterality: N/A;   COLONOSCOPY N/A 07/18/2014   Procedure: COLONOSCOPY;  Surgeon: Rogene Houston, MD;  Location: [facility];  Service: Endoscopy;  Laterality: N/A;  830   COLONOSCOPY N/A 09/09/2017   Procedure: COLONOSCOPY;  Surgeon: Rogene Houston, MD;  Location: [facility];  Service: Endoscopy;  Laterality: N/A;  955   COSMETIC SURGERY  1940's &1950's   CYSTOSCOPY/URETEROSCOPY/HOLMIUM LASER/STENT PLACEMENT Bilateral 12/06/2017   Procedure: CYSTOSCOPY/RETROGRADE/URETEROSCOPY/HOLMIUM LASER/STENT EXCHANGE;  Surgeon: Festus Aloe, MD;  Location: [facility] ORS;  Service: Urology;  Laterality: Bilateral;  ONLY NEEDS 60 MIN   HERNIA REPAIR     LYMPH NODE BIOPSY Left    Under left arm   POLYPECTOMY  09/09/2017   Procedure: POLYPECTOMY;  Surgeon: Rogene Houston, MD;  Location: [facility];  Service: Endoscopy;;  colon   vocal cord surgery       Allergies  Allergen Reactions   Flomax [Tamsulosin Hcl] Nausea Only    Dizziness  Pt is on flomax   Heparin Other (See Comments)    Tingling in face and shortness of breath      Family History  Problem Relation Age of Onset   Breast cancer Mother    Cancer Mother    Prostate cancer Father    Cancer Father    COPD Sister    Birth defects Son      Social History Mr. Nuttall reports that he has never smoked. He has never used smokeless tobacco. Mr. Hessling reports that he does not currently use alcohol.   Review of Systems CONSTITUTIONAL: No weight loss, fever, chills, weakness or fatigue.  HEENT: Eyes: No visual loss, blurred vision, double vision or yellow sclerae.No hearing loss, sneezing, congestion, runny nose or sore throat.  SKIN: No rash or itching.  CARDIOVASCULAR: per hpi RESPIRATORY:  No shortness of breath, cough or sputum.  GASTROINTESTINAL: No anorexia, nausea, vomiting or diarrhea. No abdominal pain or blood.   GENITOURINARY: No burning on urination, no polyuria NEUROLOGICAL: No headache, dizziness, syncope, paralysis, ataxia, numbness or tingling in the extremities. No change in bowel or bladder control.  MUSCULOSKELETAL: No muscle, back pain, joint pain or stiffness.  LYMPHATICS: No enlarged nodes. No history of splenectomy.  PSYCHIATRIC: No history of depression or anxiety.  ENDOCRINOLOGIC: No reports of sweating, cold or heat intolerance. No polyuria or polydipsia.  Marland Kitchen   Physical Examination Today's Vitals   05/18/22 1338  BP: (!) 108/56  Pulse: (!) 52  SpO2: 98%  Weight: 180 lb 9.6 oz (81.9 kg)  Height: 6' (1.829 m)   Body mass index is 24.49 kg/m.  Gen: resting comfortably, no acute distress HEENT: no scleral icterus, pupils equal round and reactive, no palptable cervical adenopathy,  CV: RRR, n m/r/ gno jvd Resp: Clear to auscultation bilaterally GI: abdomen is soft, non-tender, non-distended, normal bowel sounds, no hepatosplenomegaly MSK: extremities are warm, no edema.  Skin: warm, no rash Neuro:  no focal deficits Psych: appropriate affect   Diagnostic Studies  11/2020 echo IMPRESSIONS     1. Left ventricular ejection fraction, by estimation, is 50%. The left  ventricle has mildly decreased function. The left ventricle has no  regional wall motion abnormalities. The left ventricular internal cavity  size was mildly dilated. Left ventricular  diastolic parameters are consistent with Grade I diastolic dysfunction  (impaired relaxation). The average left ventricular global longitudinal  strain is -18.2 %. The global longitudinal strain is normal.   2. Right ventricular systolic function is normal. The right ventricular  size is normal.   3. Left atrial size was severely dilated.   4. Right atrial size was mildly dilated.   5. The mitral valve is normal in structure. No evidence of mitral valve  regurgitation. No evidence of mitral stenosis.   6. The aortic valve  was not well visualized. There is mild calcification  of the aortic valve. There is mild thickening of the aortic valve. Aortic  valve regurgitation is mild. Mild aortic valve stenosis.   7. The inferior vena cava is normal in size with greater than 50%  respiratory variability, suggesting right atrial pressure of 3 mmHg.   Comparison(s): Echocardiogram done 06/12/20 showed an EF of 20-25%.   06/2021 echo [facility] SUMMARY  The left ventricular size is normal.  Mild left ventricular hypertrophy  Left ventricular systolic function is low normal.  LV ejection fraction = 50-55%.  The right ventricle is normal in size and function.  There is aortic valve sclerosis.  There is mild aortic regurgitation.  No significant stenosis seen  Estimated right ventricular systolic pressure is 27 mmHg.  The inferior vena cava was not visualized during the exam.  There is no pericardial effusion.  T  There is no comparison study available.    Assessment and Plan  1. Chronic systolic HF - possibly tachy mediated given issues with afib with RVR at time of diagnosis - entresto stopped during recent admission with AKI. Off beta blocker due to bradycardia, recent retrial with recurrent rates in 40s - from last echo LVEF has normalized.  -no symptoms, continue current meds         2. Afib/acquired thrombophilia - no symptoms, continue current meds including eliquis for stroke prevention   3. HTN - at goal, continue current meds      Arnoldo Lenis,  M.D.,

## 2022-05-18 NOTE — Patient Instructions (Signed)

## 2022-05-20 DIAGNOSIS — I509 Heart failure, unspecified: Secondary | ICD-10-CM | POA: Diagnosis not present

## 2022-05-20 DIAGNOSIS — Z794 Long term (current) use of insulin: Secondary | ICD-10-CM

## 2022-05-20 DIAGNOSIS — E1159 Type 2 diabetes mellitus with other circulatory complications: Secondary | ICD-10-CM

## 2022-05-21 ENCOUNTER — Encounter: Payer: Self-pay | Admitting: Cardiology

## 2022-05-25 NOTE — Telephone Encounter (Signed)
Wife sent a Mychart message needing to verify the directions for hydralazine. Informed the wife that per instructions from last office visit, he should take this medication three times a day. Wife wants to also verify with Dr Harl Bowie. Please advise

## 2022-05-31 ENCOUNTER — Telehealth: Payer: Self-pay | Admitting: Cardiology

## 2022-05-31 MED ORDER — HYDRALAZINE HCL 100 MG PO TABS: 100.0000 mg | ORAL_TABLET | Freq: Two times a day (BID) | ORAL | 1 refills | Status: DC

## 2022-05-31 NOTE — Telephone Encounter (Signed)
Pt c/o medication issue:  1. Name of Medication: hydrALAZINE (APRESOLINE) 100 MG tablet  2. How are you currently taking this medication (dosage and times per day)? 2 x daily   3. Are you having a reaction (difficulty breathing--STAT)? No  4. What is your medication issue? Patients wife states that the dosage instructions changed back to 3 times daily, but she has only been giving pt med 2 x daily and would like clarification on this. Please advise.

## 2022-05-31 NOTE — Telephone Encounter (Signed)
Will route to provider for clarification of medication. Past OV notes indicate medication to be taken BID.  Please advise.

## 2022-05-31 NOTE — Telephone Encounter (Signed)
BID dosing please for hydralazine  Zandra Abts MD

## 2022-05-31 NOTE — Telephone Encounter (Signed)
Spoke to pt's wife and verbalized correct dosage for Hydralazine. Pt's wife had no further questions or concerns at this time.

## 2022-06-02 DIAGNOSIS — Z801 Family history of malignant neoplasm of trachea, bronchus and lung: Secondary | ICD-10-CM | POA: Diagnosis not present

## 2022-06-02 DIAGNOSIS — Z7961 Long term (current) use of immunomodulator: Secondary | ICD-10-CM | POA: Diagnosis not present

## 2022-06-02 DIAGNOSIS — C8589 Other specified types of non-Hodgkin lymphoma, extranodal and solid organ sites: Secondary | ICD-10-CM | POA: Diagnosis not present

## 2022-06-02 DIAGNOSIS — Z803 Family history of malignant neoplasm of breast: Secondary | ICD-10-CM | POA: Diagnosis not present

## 2022-06-02 DIAGNOSIS — I509 Heart failure, unspecified: Secondary | ICD-10-CM | POA: Diagnosis not present

## 2022-06-02 DIAGNOSIS — Z833 Family history of diabetes mellitus: Secondary | ICD-10-CM | POA: Diagnosis not present

## 2022-06-02 DIAGNOSIS — Z79899 Other long term (current) drug therapy: Secondary | ICD-10-CM | POA: Diagnosis not present

## 2022-06-02 DIAGNOSIS — I4891 Unspecified atrial fibrillation: Secondary | ICD-10-CM | POA: Diagnosis not present

## 2022-06-02 DIAGNOSIS — Z85828 Personal history of other malignant neoplasm of skin: Secondary | ICD-10-CM | POA: Diagnosis not present

## 2022-06-02 DIAGNOSIS — Z9889 Other specified postprocedural states: Secondary | ICD-10-CM | POA: Diagnosis not present

## 2022-06-02 DIAGNOSIS — Z9049 Acquired absence of other specified parts of digestive tract: Secondary | ICD-10-CM | POA: Diagnosis not present

## 2022-06-02 DIAGNOSIS — N183 Chronic kidney disease, stage 3 unspecified: Secondary | ICD-10-CM | POA: Diagnosis not present

## 2022-06-02 DIAGNOSIS — I130 Hypertensive heart and chronic kidney disease with heart failure and stage 1 through stage 4 chronic kidney disease, or unspecified chronic kidney disease: Secondary | ICD-10-CM | POA: Diagnosis not present

## 2022-06-02 DIAGNOSIS — Z8249 Family history of ischemic heart disease and other diseases of the circulatory system: Secondary | ICD-10-CM | POA: Diagnosis not present

## 2022-06-09 ENCOUNTER — Ambulatory Visit (INDEPENDENT_AMBULATORY_CARE_PROVIDER_SITE_OTHER): Payer: Medicare Other

## 2022-06-09 ENCOUNTER — Encounter (HOSPITAL_COMMUNITY): Payer: Self-pay | Admitting: *Deleted

## 2022-06-09 ENCOUNTER — Emergency Department (HOSPITAL_COMMUNITY): Payer: Medicare Other

## 2022-06-09 ENCOUNTER — Other Ambulatory Visit: Payer: Self-pay

## 2022-06-09 ENCOUNTER — Inpatient Hospital Stay (HOSPITAL_COMMUNITY)
Admission: EM | Admit: 2022-06-09 | Discharge: 2022-06-14 | DRG: 481 | Disposition: A | Discharge status: Skilled Nursing Facility | Payer: Medicare Other | Attending: Internal Medicine | Admitting: Internal Medicine

## 2022-06-09 DIAGNOSIS — Z825 Family history of asthma and other chronic lower respiratory diseases: Secondary | ICD-10-CM

## 2022-06-09 DIAGNOSIS — Z66 Do not resuscitate: Secondary | ICD-10-CM | POA: Diagnosis present

## 2022-06-09 DIAGNOSIS — Z7901 Long term (current) use of anticoagulants: Secondary | ICD-10-CM | POA: Diagnosis not present

## 2022-06-09 DIAGNOSIS — Z8572 Personal history of non-Hodgkin lymphomas: Secondary | ICD-10-CM

## 2022-06-09 DIAGNOSIS — Z87442 Personal history of urinary calculi: Secondary | ICD-10-CM | POA: Diagnosis not present

## 2022-06-09 DIAGNOSIS — D631 Anemia in chronic kidney disease: Secondary | ICD-10-CM | POA: Diagnosis present

## 2022-06-09 DIAGNOSIS — I482 Chronic atrial fibrillation, unspecified: Secondary | ICD-10-CM

## 2022-06-09 DIAGNOSIS — C8590 Non-Hodgkin lymphoma, unspecified, unspecified site: Secondary | ICD-10-CM

## 2022-06-09 DIAGNOSIS — Z85828 Personal history of other malignant neoplasm of skin: Secondary | ICD-10-CM | POA: Diagnosis not present

## 2022-06-09 DIAGNOSIS — I480 Paroxysmal atrial fibrillation: Secondary | ICD-10-CM | POA: Diagnosis not present

## 2022-06-09 DIAGNOSIS — Z23 Encounter for immunization: Secondary | ICD-10-CM | POA: Diagnosis not present

## 2022-06-09 DIAGNOSIS — E1122 Type 2 diabetes mellitus with diabetic chronic kidney disease: Secondary | ICD-10-CM | POA: Diagnosis present

## 2022-06-09 DIAGNOSIS — D696 Thrombocytopenia, unspecified: Secondary | ICD-10-CM | POA: Diagnosis not present

## 2022-06-09 DIAGNOSIS — J449 Chronic obstructive pulmonary disease, unspecified: Secondary | ICD-10-CM | POA: Diagnosis present

## 2022-06-09 DIAGNOSIS — N1832 Chronic kidney disease, stage 3b: Secondary | ICD-10-CM | POA: Diagnosis not present

## 2022-06-09 DIAGNOSIS — Z20822 Contact with and (suspected) exposure to covid-19: Secondary | ICD-10-CM | POA: Diagnosis present

## 2022-06-09 DIAGNOSIS — Z85118 Personal history of other malignant neoplasm of bronchus and lung: Secondary | ICD-10-CM

## 2022-06-09 DIAGNOSIS — I152 Hypertension secondary to endocrine disorders: Secondary | ICD-10-CM | POA: Diagnosis not present

## 2022-06-09 DIAGNOSIS — I4891 Unspecified atrial fibrillation: Secondary | ICD-10-CM | POA: Diagnosis present

## 2022-06-09 DIAGNOSIS — Z9049 Acquired absence of other specified parts of digestive tract: Secondary | ICD-10-CM

## 2022-06-09 DIAGNOSIS — E876 Hypokalemia: Secondary | ICD-10-CM | POA: Diagnosis present

## 2022-06-09 DIAGNOSIS — E119 Type 2 diabetes mellitus without complications: Secondary | ICD-10-CM

## 2022-06-09 DIAGNOSIS — Z8719 Personal history of other diseases of the digestive system: Secondary | ICD-10-CM | POA: Diagnosis not present

## 2022-06-09 DIAGNOSIS — Z515 Encounter for palliative care: Secondary | ICD-10-CM | POA: Diagnosis not present

## 2022-06-09 DIAGNOSIS — M6281 Muscle weakness (generalized): Secondary | ICD-10-CM | POA: Diagnosis not present

## 2022-06-09 DIAGNOSIS — S72142A Displaced intertrochanteric fracture of left femur, initial encounter for closed fracture: Principal | ICD-10-CM | POA: Diagnosis present

## 2022-06-09 DIAGNOSIS — D509 Iron deficiency anemia, unspecified: Secondary | ICD-10-CM | POA: Diagnosis not present

## 2022-06-09 DIAGNOSIS — Z794 Long term (current) use of insulin: Secondary | ICD-10-CM

## 2022-06-09 DIAGNOSIS — R278 Other lack of coordination: Secondary | ICD-10-CM | POA: Diagnosis not present

## 2022-06-09 DIAGNOSIS — Z803 Family history of malignant neoplasm of breast: Secondary | ICD-10-CM

## 2022-06-09 DIAGNOSIS — Z7189 Other specified counseling: Secondary | ICD-10-CM | POA: Diagnosis not present

## 2022-06-09 DIAGNOSIS — Z8042 Family history of malignant neoplasm of prostate: Secondary | ICD-10-CM

## 2022-06-09 DIAGNOSIS — I10 Essential (primary) hypertension: Secondary | ICD-10-CM | POA: Diagnosis not present

## 2022-06-09 DIAGNOSIS — W010XXA Fall on same level from slipping, tripping and stumbling without subsequent striking against object, initial encounter: Secondary | ICD-10-CM | POA: Diagnosis present

## 2022-06-09 DIAGNOSIS — N183 Chronic kidney disease, stage 3 unspecified: Secondary | ICD-10-CM

## 2022-06-09 DIAGNOSIS — I130 Hypertensive heart and chronic kidney disease with heart failure and stage 1 through stage 4 chronic kidney disease, or unspecified chronic kidney disease: Secondary | ICD-10-CM | POA: Diagnosis not present

## 2022-06-09 DIAGNOSIS — S72142D Displaced intertrochanteric fracture of left femur, subsequent encounter for closed fracture with routine healing: Secondary | ICD-10-CM | POA: Diagnosis not present

## 2022-06-09 DIAGNOSIS — W19XXXA Unspecified fall, initial encounter: Secondary | ICD-10-CM | POA: Diagnosis not present

## 2022-06-09 DIAGNOSIS — Z79899 Other long term (current) drug therapy: Secondary | ICD-10-CM

## 2022-06-09 DIAGNOSIS — R262 Difficulty in walking, not elsewhere classified: Secondary | ICD-10-CM | POA: Diagnosis not present

## 2022-06-09 DIAGNOSIS — Y92007 Garden or yard of unspecified non-institutional (private) residence as the place of occurrence of the external cause: Secondary | ICD-10-CM | POA: Diagnosis not present

## 2022-06-09 DIAGNOSIS — I5042 Chronic combined systolic (congestive) and diastolic (congestive) heart failure: Secondary | ICD-10-CM | POA: Diagnosis not present

## 2022-06-09 DIAGNOSIS — E1159 Type 2 diabetes mellitus with other circulatory complications: Secondary | ICD-10-CM | POA: Diagnosis not present

## 2022-06-09 DIAGNOSIS — D649 Anemia, unspecified: Secondary | ICD-10-CM | POA: Diagnosis not present

## 2022-06-09 DIAGNOSIS — Z888 Allergy status to other drugs, medicaments and biological substances status: Secondary | ICD-10-CM

## 2022-06-09 DIAGNOSIS — M25552 Pain in left hip: Secondary | ICD-10-CM | POA: Diagnosis not present

## 2022-06-09 DIAGNOSIS — K219 Gastro-esophageal reflux disease without esophagitis: Secondary | ICD-10-CM | POA: Diagnosis present

## 2022-06-09 LAB — CBC WITH DIFFERENTIAL/PLATELET
Abs Immature Granulocytes: 0.03 10*3/uL (ref 0.00–0.07)
Basophils Absolute: 0.1 10*3/uL (ref 0.0–0.1)
Basophils Relative: 1 %
Eosinophils Absolute: 0.2 10*3/uL (ref 0.0–0.5)
Eosinophils Relative: 3 %
HCT: 34.4 % — ABNORMAL LOW (ref 39.0–52.0)
Hemoglobin: 11.9 g/dL — ABNORMAL LOW (ref 13.0–17.0)
Immature Granulocytes: 1 %
Lymphocytes Relative: 18 %
Lymphs Abs: 1 10*3/uL (ref 0.7–4.0)
MCH: 32 pg (ref 26.0–34.0)
MCHC: 34.6 g/dL (ref 30.0–36.0)
MCV: 92.5 fL (ref 80.0–100.0)
Monocytes Absolute: 0.3 10*3/uL (ref 0.1–1.0)
Monocytes Relative: 5 %
Neutro Abs: 4.3 10*3/uL (ref 1.7–7.7)
Neutrophils Relative %: 72 %
Platelets: 148 10*3/uL — ABNORMAL LOW (ref 150–400)
RBC: 3.72 MIL/uL — ABNORMAL LOW (ref 4.22–5.81)
RDW: 15.6 % — ABNORMAL HIGH (ref 11.5–15.5)
WBC: 5.9 10*3/uL (ref 4.0–10.5)
nRBC: 0 % (ref 0.0–0.2)

## 2022-06-09 LAB — BASIC METABOLIC PANEL
Anion gap: 10 (ref 5–15)
BUN: 19 mg/dL (ref 8–23)
CO2: 19 mmol/L — ABNORMAL LOW (ref 22–32)
Calcium: 8.4 mg/dL — ABNORMAL LOW (ref 8.9–10.3)
Chloride: 109 mmol/L (ref 98–111)
Creatinine, Ser: 1.79 mg/dL — ABNORMAL HIGH (ref 0.61–1.24)
GFR, Estimated: 39 mL/min — ABNORMAL LOW (ref >60)
Glucose, Bld: 107 mg/dL — ABNORMAL HIGH (ref 70–99)
Potassium: 3.4 mmol/L — ABNORMAL LOW (ref 3.5–5.1)
Sodium: 138 mmol/L (ref 135–145)

## 2022-06-09 MED ORDER — CEFAZOLIN SODIUM-DEXTROSE 2-4 GM/100ML-% IV SOLN
2.0000 g | INTRAVENOUS | Status: AC
  Administered 2022-06-11: 2 g via INTRAVENOUS
  Filled 2022-06-09: qty 100

## 2022-06-09 MED ORDER — TRANEXAMIC ACID-NACL 1000-0.7 MG/100ML-% IV SOLN
1000.0000 mg | INTRAVENOUS | Status: AC
  Administered 2022-06-11: 1000 mg via INTRAVENOUS
  Filled 2022-06-09: qty 100

## 2022-06-09 MED ORDER — ONDANSETRON HCL 4 MG/2ML IJ SOLN
4.0000 mg | Freq: Once | INTRAMUSCULAR | Status: AC
  Administered 2022-06-09: 4 mg via INTRAVENOUS
  Filled 2022-06-09: qty 2

## 2022-06-09 MED ORDER — HYDROMORPHONE HCL 1 MG/ML IJ SOLN
0.5000 mg | Freq: Once | INTRAMUSCULAR | Status: AC
  Administered 2022-06-09: 0.5 mg via INTRAVENOUS
  Filled 2022-06-09: qty 0.5

## 2022-06-09 NOTE — ED Provider Notes (Signed)
[facility] EMERGENCY DEPARTMENT Provider Note   CSN: [id number] Arrival date & time: 06/09/22  1703     History  Chief Complaint  Patient presents with   Lytle Michaels    CANUTO KINGSTON is a 76 y.o. male.  Patient states he fell and hurt his left hip.  Patient has a history of diabetes atrial fibs.  He took his Eliquis this morning.  The history is provided by the patient and medical records. No language interpreter was used.  Fall This is a new problem. The current episode started 12 to 24 hours ago. The problem occurs constantly. The problem has not changed since onset.Pertinent negatives include no chest pain, no abdominal pain and no headaches. Nothing aggravates the symptoms. Nothing relieves the symptoms. He has tried nothing for the symptoms.       Home Medications Prior to Admission medications   Medication Sig Start Date End Date Taking? Authorizing Provider  acetaminophen (TYLENOL) 500 MG tablet Take 1,000 mg by mouth every 6 (six) hours as needed for moderate pain or headache.   Yes [provider]  amiodarone (PACERONE) 200 MG tablet TAKE 1 TABLET BY MOUTH ONCE A DAY. 03/05/22  Yes Fenton, Clint R, PA  amLODipine (NORVASC) 10 MG tablet TAKE 1 TABLET BY MOUTH ONCE DAILY. 11/11/21  Yes Branch, Alphonse Guild, MD  apixaban (ELIQUIS) 5 MG TABS tablet TAKE ONE TABLET BY MOUTH 2 TIMES A DAY. 12/21/21  Yes Branch, Alphonse Guild, MD  cetirizine (ZYRTEC) 10 MG tablet Take 1 tablet (10 mg total) by mouth daily as needed for allergies. 01/01/20  Yes Corum, Rex Kras, MD  furosemide (LASIX) 20 MG tablet Take 1 tablet (20 mg total) by mouth every other day. (May take an additional tab as needed.) 01/21/22  Yes Branch, Alphonse Guild, MD  hydrALAZINE (APRESOLINE) 100 MG tablet Take 1 tablet (100 mg total) by mouth 2 (two) times daily. 05/31/22  Yes BranchAlphonse Guild, MD  isosorbide mononitrate (IMDUR) 30 MG 24 hr tablet TAKE 1/2 TABLET BY MOUTH ONCE DAILY. 11/11/21  Yes BranchAlphonse Guild, MD   lenalidomide (REVLIMID) 10 MG capsule Take 10 mg by mouth. Take 10 mg on days 1-21 of a 28 day cycle. 07/13/21  Yes [provider]  omeprazole (PRILOSEC) 20 MG capsule Take 1 capsule (20 mg total) by mouth daily as needed (reflux). 06/15/21  Yes [name], MD  potassium chloride SA (KLOR-CON M) 20 MEQ tablet TAKE ONE TABLET BY MOUTH ONCE DAILY. 05/07/22  Yes Branch, Alphonse Guild, MD  VENTOLIN HFA 108 [phone] Base) MCG/ACT inhaler Inhale 2 puffs into the lungs every 6 (six) hours as needed for wheezing or shortness of breath. 07/11/20  Yes Fayrene Helper, MD  blood glucose meter kit and supplies KIT Inject 1 each into the skin 4 (four) times daily -  before meals and at bedtime. Dispense based on patient and insurance preference. Use up to four times daily as directed. 06/02/21   Barb Merino, MD  Continuous Blood Gluc Receiver (FREESTYLE LIBRE 2 READER) DEVI Use to monitor blood glucose continuously 06/09/21   [provider]  Continuous Blood Gluc Sensor (FREESTYLE LIBRE 2 SENSOR) MISC USE TO MONITOR BLOOD SUGAR DAILY. (CHANGE EVERY 2WEEKS) 12/14/21   [name], MD  glucose blood (ONETOUCH ULTRA) test strip 1 each by Other route 4 (four) times daily. 10/27/21   [name], MD  insulin lispro (HUMALOG KWIKPEN) 100 UNIT/ML KwikPen Inject 1-10 Units into the skin 4 (  four) times daily -  before meals and at bedtime. Blood sugars 70-120: 1 unit 121-150: 2 units 151-200: 3 units 201-250: 4 units 251-300: 5 units 301-350: 6 units 351-400: 9 units More than 400, 10 units and call your doctor Patient not taking: Reported on 06/09/2022 06/02/21   Barb Merino, MD  Insulin Pen Needle 29G X 12MM MISC 1 each by Does not apply route 4 (four) times daily -  before meals and at bedtime. 07/09/21   Fayrene Helper, MD  Lancets (ONETOUCH DELICA PLUS [id number]) [place] 1 each by Other route 4 (four) times daily. 06/02/21   [provider]      Allergies    Flomax [tamsulosin  hcl] and Heparin    Review of Systems   Review of Systems  Constitutional:  Negative for appetite change and fatigue.  HENT:  Negative for congestion, ear discharge and sinus pressure.   Eyes:  Negative for discharge.  Respiratory:  Negative for cough.   Cardiovascular:  Negative for chest pain.  Gastrointestinal:  Negative for abdominal pain and diarrhea.  Genitourinary:  Negative for frequency and hematuria.  Musculoskeletal:  Negative for back pain.       Left hip pain  Skin:  Negative for rash.  Neurological:  Negative for seizures and headaches.  Psychiatric/Behavioral:  Negative for hallucinations.     Physical Exam Updated Vital Signs BP 133/72   Pulse 74   Temp 98.4 F (36.9 C) (Oral)   Resp 18   Ht 6' (1.829 m)   Wt 79.4 kg   SpO2 100%   BMI 23.73 kg/m  Physical Exam Vitals and nursing note reviewed.  Constitutional:      Appearance: He is well-developed.  HENT:     Head: Normocephalic.     Nose: Nose normal.  Eyes:     General: No scleral icterus.    Conjunctiva/sclera: Conjunctivae normal.  Neck:     Thyroid: No thyromegaly.  Cardiovascular:     Rate and Rhythm: Normal rate and regular rhythm.     Heart sounds: No murmur heard.    No friction rub. No gallop.  Pulmonary:     Breath sounds: No stridor. No wheezing or rales.  Chest:     Chest wall: No tenderness.  Abdominal:     General: There is no distension.     Tenderness: There is no abdominal tenderness. There is no rebound.  Musculoskeletal:     Cervical back: Neck supple.     Comments: Tenderness left hip  Lymphadenopathy:     Cervical: No cervical adenopathy.  Skin:    Findings: No erythema or rash.  Neurological:     Mental Status: He is alert and oriented to person, place, and time.     Motor: No abnormal muscle tone.     Coordination: Coordination normal.  Psychiatric:        Behavior: Behavior normal.     ED Results / Procedures / Treatments   Labs (all labs ordered are  listed, but only abnormal results are displayed) Labs Reviewed  CBC WITH DIFFERENTIAL/PLATELET - Abnormal; Notable for the following components:      Result Value   RBC 3.72 (*)    Hemoglobin 11.9 (*)    HCT 34.4 (*)    RDW 15.6 (*)    Platelets 148 (*)    All other components within normal limits  BASIC METABOLIC PANEL - Abnormal; Notable for the following components:   Potassium 3.4 (*)  CO2 19 (*)    Glucose, Bld 107 (*)    Creatinine, Ser 1.79 (*)    Calcium 8.4 (*)    GFR, Estimated 39 (*)    All other components within normal limits    EKG None  Radiology DG Hip Unilat W or Wo Pelvis 2-3 Views Left  Result Date: 06/09/2022 CLINICAL DATA:  Recent fall with left hip pain, initial encounter EXAM: DG HIP (WITH OR WITHOUT PELVIS) 3V LEFT COMPARISON:  None Available. FINDINGS: Comminuted left intratrochanteric fracture is noted with mild impaction at the fracture site. No dislocation is noted. The pelvic ring appears intact. No other focal abnormality is noted. IMPRESSION: Comminuted left intratrochanteric fracture. Electronically Signed   By: Inez Catalina M.D.   On: 06/09/2022 18:59    Procedures Procedures    Medications Ordered in ED Medications  HYDROmorphone (DILAUDID) injection 0.5 mg (0.5 mg Intravenous Given 06/09/22 2047)  ondansetron (ZOFRAN) injection 4 mg (4 mg Intravenous Given 06/09/22 2047)    ED Course/ Medical Decision Making/ A&P                           Medical Decision Making Amount and/or Complexity of Data Reviewed Labs: ordered. Radiology: ordered.  Risk Prescription drug management. Decision regarding hospitalization.  This patient presents to the ED for concern of fall, this involves an extensive number of treatment options, and is a complaint that carries with it a high risk of complications and morbidity.  The differential diagnosis includes fractured left hip   Co morbidities that complicate the patient evaluation  Patient has a  history of diabetes and atrial fibs   Additional history obtained:  Additional history obtained from relative External records from outside source obtained and reviewed including hospital records   Lab Tests:  I Ordered, and personally interpreted labs.  The pertinent results include: White count 5.9, hemoglobin 11.9 glucose 107   Imaging Studies ordered:  I ordered imaging studies including left hip I independently visualized and interpreted imaging which showed comminuted intertrochanteric fracture I agree with the radiologist interpretation   Cardiac Monitoring: / EKG:  The patient was maintained on a cardiac monitor.  I personally viewed and interpreted the cardiac monitored which showed an underlying rhythm of: Normal sinus rhythm   Consultations Obtained:  I requested consultation with the hospitalist and orthopedics,  and discussed lab and imaging findings as well as pertinent plan - they recommend: Admission and repair of fractured hip in 2 days   Problem List / ED Course / Critical interventions / Medication management  Fractured hip and atrial fibs I ordered medication including Dilaudid for pain Reevaluation of the patient after these medicines showed that the patient improved I have reviewed the patients home medicines and have made adjustments as needed   Social Determinants of Health:  None   Test / Admission - Considered:  None  Patient with a left hip fracture.  Since he took his Eliquis this morning and will not be able to do surgery until Friday.  He will be admitted to medicine with orthopedic consult        Final Clinical Impression(s) / ED Diagnoses Final diagnoses:  Closed displaced intertrochanteric fracture of left femur, initial encounter [facility])    Rx / DC Orders ED Discharge Orders     None         Milton Ferguson, MD 06/11/22 1244

## 2022-06-09 NOTE — ED Triage Notes (Signed)
Pt brought in by [facility] for c/o fall; pt states she was outside and missed a step causing him to fall on his left side; pt c/o left hip pain

## 2022-06-09 NOTE — H&P (Incomplete)
History and Physical    Patient: Darius Smith JKD:[id number] DOB: [birth year] DOA: 06/09/2022 DOS: the patient was seen and examined on 06/09/2022 PCP: [name], MD  Patient coming from: {Point_of_Origin:[address]777}  Chief Complaint:  Chief Complaint  Patient presents with   Fall   HPI: Darius Smith is a 76 y.o. male with medical history significant of ***  Review of Systems: {ROS_Text:26778} Past Medical History:  Diagnosis Date   Allergic rhinitis 07/02/2019   Allergy 2015   Arthritis    both hands   Atrial fibrillation ([place]) 10/28/2017   Dr. Harl Bowie   Atrial fibrillation with RVR ([place]) 07/14/2020   Blood in urine    occ   Bronchitis    Cancer ([place])    Basal cell   Cardiomegaly 10/28/2017   CHF (congestive heart failure) ([place])    Clotting disorder ([place])    Complication of anesthesia    Mr. Clinard developed a. fib in recovery after cysto procedure 10/2017 was transferred to [facility] and underwent successful cardioversion   Diabetes [facility])    Diverticulosis of sigmoid colon 08/2017   Noted on colonoscopy   Dysrhythmia    External hemorrhoids 08/2017   GERD (gastroesophageal reflux disease)    History of colon polyps 08/2017   History of kidney stones    History of rectal bleeding    History of right inguinal hernia    Hypertension    Lung cancer ([place]) 06/25/2020   Lymphoma ([place])    Nephrolithiasis 07/02/2019   Rectal bleeding 09/02/2017   Added automatically from request for surgery [id number]   Shortness of breath    Stage 3 chronic kidney disease [facility])    Past Surgical History:  Procedure Laterality Date   AXILLARY LYMPH NODE BIOPSY Left 06/05/2021   Procedure: AXILLARY LYMPH NODE BIOPSY;  Surgeon: Virl Cagey, MD;  Location: [facility] ORS;  Service: General;  Laterality: Left;   CARDIOVERSION     10/2017   CARDIOVERSION N/A 07/16/2020   Procedure: CARDIOVERSION;  Surgeon: Arnoldo Lenis, MD;  Location: [facility] ENDO SUITE;  Service: Endoscopy;  Laterality: N/A;    CARDIOVERSION N/A 08/21/2020   Procedure: CARDIOVERSION;  Surgeon: Satira Sark, MD;  Location: [facility] ORS;  Service: Cardiovascular;  Laterality: N/A;   CARDIOVERSION N/A 09/10/2020   Procedure: CARDIOVERSION;  Surgeon: Josue Hector, MD;  Location: [facility] ENDOSCOPY;  Service: Cardiovascular;  Laterality: N/A;   CHOLECYSTECTOMY     CLEFT LIP REPAIR     several from childhood til 76 years old   [place]     several from childhood til 76 years old   COLONOSCOPY  04/28/2011   Procedure: COLONOSCOPY;  Surgeon: Rogene Houston, MD;  Location[facility];  Service: Endoscopy;  Laterality: N/A;   COLONOSCOPY N/A 07/18/2014   Procedure: COLONOSCOPY;  Surgeon: Rogene Houston, MD;  Location[facility];  Service: Endoscopy;  Laterality: N/A;  830   COLONOSCOPY N/A 09/09/2017   Procedure: COLONOSCOPY;  Surgeon: Rogene Houston, MD;  Location: [facility] END[facility];  Service: Endoscopy;  Laterality: N/A;  955   COSMETIC SURGERY  1940's &1950's   CYSTOSCOPY/URETEROSCOPY/HOLMIUM LASER/STENT PLACEMENT Bilateral 12/06/2017   Procedure: CYSTOSCOPY/RETROGRADE/URETEROSCOPY/HOLMIUM LASER/STENT EXCHANGE;  Surgeon: Festus Aloe, MD;  Location: [facility] ORS;  Service: Urology;  Laterality: Bilateral;  ONLY NEEDS 60 MIN   HERNIA REPAIR     LYMPH NODE BIOPSY Left    Under left arm   POLYPECTOMY  09/09/2017   Procedure: POLYPECTOMY;  Surgeon:  Rogene Houston, MD;  Location[facility];  Service: Endoscopy;;  colon   vocal cord surgery     Social History:  reports that he has never smoked. He has never used smokeless tobacco. He reports that he does not currently use alcohol. He reports that he does not use drugs.  Allergies  Allergen Reactions   Flomax [Tamsulosin Hcl] Nausea Only    Dizziness  Pt is on flomax   Heparin Other (See Comments)    Tingling in face and shortness of breath    Family History  Problem Relation Age of Onset   Breast cancer Mother    Cancer Mother    Prostate  cancer Father    Cancer Father    COPD Sister    Birth defects Son     Prior to Admission medications   Medication Sig Start Date End Date Taking? Authorizing Provider  acetaminophen (TYLENOL) 500 MG tablet Take 1,000 mg by mouth every 6 (six) hours as needed for moderate pain or headache.   Yes [provider]  amiodarone (PACERONE) 200 MG tablet TAKE 1 TABLET BY MOUTH ONCE A DAY. 03/05/22  Yes Fenton, Clint R, PA  amLODipine (NORVASC) 10 MG tablet TAKE 1 TABLET BY MOUTH ONCE DAILY. 11/11/21  Yes Branch, Alphonse Guild, MD  apixaban (ELIQUIS) 5 MG TABS tablet TAKE ONE TABLET BY MOUTH 2 TIMES A DAY. 12/21/21  Yes Branch, Alphonse Guild, MD  cetirizine (ZYRTEC) 10 MG tablet Take 1 tablet (10 mg total) by mouth daily as needed for allergies. 01/01/20  Yes Corum, Rex Kras, MD  furosemide (LASIX) 20 MG tablet Take 1 tablet (20 mg total) by mouth every other day. (May take an additional tab as needed.) 01/21/22  Yes Branch, Alphonse Guild, MD  hydrALAZINE (APRESOLINE) 100 MG tablet Take 1 tablet (100 mg total) by mouth 2 (two) times daily. 05/31/22  Yes BranchAlphonse Guild, MD  isosorbide mononitrate (IMDUR) 30 MG 24 hr tablet TAKE 1/2 TABLET BY MOUTH ONCE DAILY. 11/11/21  Yes BranchAlphonse Guild, MD  lenalidomide (REVLIMID) 10 MG capsule Take 10 mg by mouth. Take 10 mg on days 1-21 of a 28 day cycle. 07/13/21  Yes [provider]  omeprazole (PRILOSEC) 20 MG capsule Take 1 capsule (20 mg total) by mouth daily as needed (reflux). 06/15/21  Yes [name], MD  potassium chloride SA (KLOR-CON M) 20 MEQ tablet TAKE ONE TABLET BY MOUTH ONCE DAILY. 05/07/22  Yes Branch, Alphonse Guild, MD  VENTOLIN HFA 108 [phone] Base) MCG/ACT inhaler Inhale 2 puffs into the lungs every 6 (six) hours as needed for wheezing or shortness of breath. 07/11/20  Yes Fayrene Helper, MD  blood glucose meter kit and supplies KIT Inject 1 each into the skin 4 (four) times daily -  before meals and at bedtime. Dispense based on patient and  insurance preference. Use up to four times daily as directed. 06/02/21   Barb Merino, MD  Continuous Blood Gluc Receiver (FREESTYLE LIBRE 2 READER) DEVI Use to monitor blood glucose continuously 06/09/21   [provider]  Continuous Blood Gluc Sensor (FREESTYLE LIBRE 2 SENSOR) MISC USE TO MONITOR BLOOD SUGAR DAILY. (CHANGE EVERY 2WEEKS) 12/14/21   [name], MD  glucose blood (ONETOUCH ULTRA) test strip 1 each by Other route 4 (four) times daily. 10/27/21   [name], MD  insulin lispro (HUMALOG KWIKPEN) 100 UNIT/ML KwikPen Inject 1-10 Units into the skin 4 (four) times daily -  before  meals and at bedtime. Blood sugars 70-120: 1 unit 121-150: 2 units 151-200: 3 units 201-250: 4 units 251-300: 5 units 301-350: 6 units 351-400: 9 units More than 400, 10 units and call your doctor Patient not taking: Reported on 06/09/2022 06/02/21   Barb Merino, MD  Insulin Pen Needle 29G X 12MM MISC 1 each by Does not apply route 4 (four) times daily -  before meals and at bedtime. 07/09/21   Fayrene Helper, MD  Lancets (ONETOUCH DELICA PLUS [id number]) [place] 1 each by Other route 4 (four) times daily. 06/02/21   [provider]    Physical Exam: Vitals:   06/09/22 1930 06/09/22 2000 06/09/22 2030 06/09/22 2059  BP: 128/75 125/75 133/72   Pulse: 76 77 73 74  Resp: (!) 25 (!) 28 (!) 29 18  Temp:      TempSrc:      SpO2: 100% 100% 100% 100%  Weight:      Height:       *** Data Reviewed: {Tip this will not be part of the note when signed- Document your independent interpretation of telemetry tracing, EKG, lab, Radiology test or any other diagnostic tests. Add any new diagnostic test ordered today. (Optional):26781} {Results:26384}  Assessment and Plan: No notes have been filed under this hospital service. Service: Hospitalist     Advance Care Planning:   Code Status: Prior ***  Consults: ***  Family Communication: ***  Severity of  Illness: {Observation/Inpatient:21159}  Author: Bernadette Hoit, DO 06/09/2022 10:33 PM  For on call review [email or web address].

## 2022-06-10 ENCOUNTER — Encounter (HOSPITAL_COMMUNITY): Payer: Self-pay | Admitting: Internal Medicine

## 2022-06-10 DIAGNOSIS — Z515 Encounter for palliative care: Secondary | ICD-10-CM

## 2022-06-10 DIAGNOSIS — D649 Anemia, unspecified: Secondary | ICD-10-CM

## 2022-06-10 DIAGNOSIS — Z7189 Other specified counseling: Secondary | ICD-10-CM

## 2022-06-10 DIAGNOSIS — J449 Chronic obstructive pulmonary disease, unspecified: Secondary | ICD-10-CM

## 2022-06-10 DIAGNOSIS — S72142A Displaced intertrochanteric fracture of left femur, initial encounter for closed fracture: Secondary | ICD-10-CM | POA: Diagnosis not present

## 2022-06-10 DIAGNOSIS — D696 Thrombocytopenia, unspecified: Secondary | ICD-10-CM

## 2022-06-10 DIAGNOSIS — E119 Type 2 diabetes mellitus without complications: Secondary | ICD-10-CM

## 2022-06-10 DIAGNOSIS — N183 Chronic kidney disease, stage 3 unspecified: Secondary | ICD-10-CM

## 2022-06-10 DIAGNOSIS — E876 Hypokalemia: Secondary | ICD-10-CM

## 2022-06-10 HISTORY — DX: Chronic obstructive pulmonary disease, unspecified: J44.9

## 2022-06-10 HISTORY — DX: Type 2 diabetes mellitus without complications: E11.9

## 2022-06-10 HISTORY — DX: Anemia, unspecified: D64.9

## 2022-06-10 LAB — URINALYSIS, ROUTINE W REFLEX MICROSCOPIC
Bilirubin Urine: NEGATIVE
Glucose, UA: NEGATIVE mg/dL
Ketones, ur: NEGATIVE mg/dL
Leukocytes,Ua: NEGATIVE
Nitrite: NEGATIVE
Protein, ur: 30 mg/dL — AB
RBC / HPF: >50 RBC/hpf — ABNORMAL HIGH (ref 0–5)
Specific Gravity, Urine: 1.02 (ref 1.005–1.030)
pH: 5 (ref 5.0–8.0)

## 2022-06-10 LAB — COMPREHENSIVE METABOLIC PANEL
ALT: 15 U/L (ref 0–44)
AST: 13 U/L — ABNORMAL LOW (ref 15–41)
Albumin: 3.2 g/dL — ABNORMAL LOW (ref 3.5–5.0)
Alkaline Phosphatase: 57 U/L (ref 38–126)
Anion gap: 4 — ABNORMAL LOW (ref 5–15)
BUN: 21 mg/dL (ref 8–23)
CO2: 23 mmol/L (ref 22–32)
Calcium: 8.3 mg/dL — ABNORMAL LOW (ref 8.9–10.3)
Chloride: 112 mmol/L — ABNORMAL HIGH (ref 98–111)
Creatinine, Ser: 1.89 mg/dL — ABNORMAL HIGH (ref 0.61–1.24)
GFR, Estimated: 36 mL/min — ABNORMAL LOW (ref >60)
Glucose, Bld: 113 mg/dL — ABNORMAL HIGH (ref 70–99)
Potassium: 4.6 mmol/L (ref 3.5–5.1)
Sodium: 139 mmol/L (ref 135–145)
Total Bilirubin: 1.3 mg/dL — ABNORMAL HIGH (ref 0.3–1.2)
Total Protein: 5.7 g/dL — ABNORMAL LOW (ref 6.5–8.1)

## 2022-06-10 LAB — CBC
HCT: 32.3 % — ABNORMAL LOW (ref 39.0–52.0)
Hemoglobin: 10.6 g/dL — ABNORMAL LOW (ref 13.0–17.0)
MCH: 32 pg (ref 26.0–34.0)
MCHC: 32.8 g/dL (ref 30.0–36.0)
MCV: 97.6 fL (ref 80.0–100.0)
Platelets: 121 10*3/uL — ABNORMAL LOW (ref 150–400)
RBC: 3.31 MIL/uL — ABNORMAL LOW (ref 4.22–5.81)
RDW: 15.6 % — ABNORMAL HIGH (ref 11.5–15.5)
WBC: 6.3 10*3/uL (ref 4.0–10.5)
nRBC: 0 % (ref 0.0–0.2)

## 2022-06-10 LAB — GLUCOSE, CAPILLARY
Glucose-Capillary: 107 mg/dL — ABNORMAL HIGH (ref 70–99)
Glucose-Capillary: 114 mg/dL — ABNORMAL HIGH (ref 70–99)
Glucose-Capillary: 107 mg/dL — ABNORMAL HIGH (ref 70–99)
Glucose-Capillary: 103 mg/dL — ABNORMAL HIGH (ref 70–99)

## 2022-06-10 LAB — PHOSPHORUS: Phosphorus: 4.6 mg/dL (ref 2.5–4.6)

## 2022-06-10 LAB — SURGICAL PCR SCREEN
MRSA, PCR: NEGATIVE
Staphylococcus aureus: NEGATIVE

## 2022-06-10 LAB — MAGNESIUM: Magnesium: 1.9 mg/dL (ref 1.7–2.4)

## 2022-06-10 MED ORDER — PANTOPRAZOLE SODIUM 40 MG PO TBEC
40.0000 mg | DELAYED_RELEASE_TABLET | Freq: Every day | ORAL | Status: DC
  Administered 2022-06-10 – 2022-06-14 (×5): 40 mg via ORAL
  Filled 2022-06-10 (×5): qty 1

## 2022-06-10 MED ORDER — AMIODARONE HCL 200 MG PO TABS
200.0000 mg | ORAL_TABLET | Freq: Every day | ORAL | Status: DC
  Administered 2022-06-10 – 2022-06-14 (×4): 200 mg via ORAL
  Filled 2022-06-10 (×5): qty 1

## 2022-06-10 MED ORDER — HYDROMORPHONE HCL 1 MG/ML IJ SOLN
0.5000 mg | INTRAMUSCULAR | Status: DC | PRN
  Administered 2022-06-10 – 2022-06-14 (×14): 0.5 mg via INTRAVENOUS
  Filled 2022-06-10 (×14): qty 0.5

## 2022-06-10 MED ORDER — LENALIDOMIDE 10 MG PO CAPS
10.0000 mg | ORAL_CAPSULE | Freq: Every day | ORAL | Status: DC
  Administered 2022-06-10 – 2022-06-13 (×4): 10 mg via ORAL

## 2022-06-10 MED ORDER — SENNOSIDES-DOCUSATE SODIUM 8.6-50 MG PO TABS
2.0000 | ORAL_TABLET | Freq: Two times a day (BID) | ORAL | Status: DC
  Administered 2022-06-10 – 2022-06-14 (×9): 2 via ORAL
  Filled 2022-06-10 (×9): qty 2

## 2022-06-10 MED ORDER — HYDRALAZINE HCL 25 MG PO TABS
100.0000 mg | ORAL_TABLET | Freq: Two times a day (BID) | ORAL | Status: DC
  Administered 2022-06-10 – 2022-06-12 (×5): 100 mg via ORAL
  Filled 2022-06-10 (×7): qty 4

## 2022-06-10 MED ORDER — ONDANSETRON HCL 4 MG PO TABS: 4.0000 mg | ORAL_TABLET | Freq: Four times a day (QID) | ORAL | Status: DC | PRN

## 2022-06-10 MED ORDER — POTASSIUM CHLORIDE CRYS ER 20 MEQ PO TBCR
20.0000 meq | EXTENDED_RELEASE_TABLET | Freq: Every day | ORAL | Status: DC
  Administered 2022-06-10: 20 meq via ORAL
  Filled 2022-06-10 (×2): qty 1

## 2022-06-10 MED ORDER — AMLODIPINE BESYLATE 5 MG PO TABS
10.0000 mg | ORAL_TABLET | Freq: Every day | ORAL | Status: DC
  Administered 2022-06-10 – 2022-06-11 (×2): 10 mg via ORAL
  Filled 2022-06-10 (×4): qty 2

## 2022-06-10 MED ORDER — ALBUTEROL SULFATE (2.5 MG/3ML) 0.083% IN NEBU: 3.0000 mL | INHALATION_SOLUTION | Freq: Four times a day (QID) | RESPIRATORY_TRACT | Status: DC | PRN

## 2022-06-10 MED ORDER — ONDANSETRON HCL 4 MG/2ML IJ SOLN: 4.0000 mg | Freq: Four times a day (QID) | INTRAMUSCULAR | Status: DC | PRN

## 2022-06-10 MED ORDER — FUROSEMIDE 20 MG PO TABS
20.0000 mg | ORAL_TABLET | ORAL | Status: DC
  Administered 2022-06-10 – 2022-06-13 (×2): 20 mg via ORAL
  Filled 2022-06-10 (×4): qty 1

## 2022-06-10 MED ORDER — INSULIN ASPART 100 UNIT/ML IJ SOLN
0.0000 [IU] | Freq: Three times a day (TID) | INTRAMUSCULAR | Status: DC
  Administered 2022-06-11 – 2022-06-14 (×6): 1 [IU] via SUBCUTANEOUS

## 2022-06-10 NOTE — Consult Note (Signed)
ORTHOPAEDIC CONSULTATION  REQUESTING PHYSICIAN: Heath Lark D, DO  ASSESSMENT AND PLAN: 76 y.o. male with the following: Left Hip Unstable Intertrochanteric femur fracture  This patient requires inpatient admission to the hospitalist, to include preoperative clearance and perioperative medical management  Patient sustained an unstable left intertrochanteric femur fracture.  I recommended operative fixation, in order to restore form and function.  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  Reports from recent PET scan are available, and there is no increased uptake within the left hip or femur.  I do not think that this is a pathologic injury.  He is on Eliquis for A-fib, with his last dose 06/09/2022 at approximately 8:30 AM.  As such, we will plan to wait 48 hours following his most recent dose, which will delay surgery until 06/11/2022.  - Weight Bearing Status/Activity: NWB Left lower extremity  - Additional recommended labs/tests: Preop Labs: CBC, BMP, PT/INR, Chest XR, and EKG  -VTE Prophylaxis: Please hold prior to OR; to resume POD#1 at the discretion of the primary team  - Pain control: Recommend PO pain medications PRN; judicious use of narcotics  - Follow-up plan: F/u 10-14 days postop  -Procedures: Plan for OR once patient has been medically optimized  Plan for Left Hip Cephalomedullary nail  Plan to proceed with surgery 06/11/2022, please make patient n.p.o. after midnight.  Risks and benefits of surgery, including, but not limited to infection, bleeding, persistent pain, damage to surrounding structures, need for further surgery, malunion, nonunion and more severe complications associated with anesthesia were discussed.  All questions have been answered and they have elected to proceed with surgery.       Chief Complaint: Left hip pain  HPI: JEROMEY KRUER is a 76 y.o. male who presented to the ED for evaluation after sustaining a mechanical fall.   Past medical history as listed below.  Currently, he is using a cane to assist with ambulation.  He is gradually increasing his level of activity.  Yesterday, he states that he fell after tripping on something in his yard.  He had immediate pain in his left hip.  He was unable to move.  He presented to the emergency department via EMS.  He was noted to have a fracture of the left hip.  This pain is controlled with pain medications.  It is not painful if he does not move his left hip.  Small movements cause him severe discomfort.  No additional injuries are noted.  Past Medical History:  Diagnosis Date   Allergic rhinitis 07/02/2019   Allergy 2015   Arthritis    both hands   Atrial fibrillation ([place]) 10/28/2017   Dr. Harl Bowie   Atrial fibrillation with RVR ([place]) 07/14/2020   Blood in urine    occ   Bronchitis    Cancer ([place])    Basal cell   Cardiomegaly 10/28/2017   CHF (congestive heart failure) ([facility])    Clotting disorder ([place])    Complication of anesthesia    Mr. Gerads developed a. fib in recovery after cysto procedure 10/2017 was transferred to [facility] and underwent successful cardioversion   COPD (chronic obstructive pulmonary disease) ([place]) 06/10/2022   Diabetes ([place])    Diverticulosis of sigmoid colon 08/2017   Noted on colonoscopy   Dysrhythmia    External hemorrhoids 08/2017   GERD (gastroesophageal reflux disease)    History of colon polyps 08/2017   History of kidney stones    History of rectal bleeding  History of right inguinal hernia    Hypertension    Lung cancer ([place]) 06/25/2020   Lymphoma ([place])    Nephrolithiasis 07/02/2019   Normocytic anemia 06/10/2022   Rectal bleeding 09/02/2017   Added automatically from request for surgery [id number]   Shortness of breath    Stage 3 chronic kidney disease ([place])    Type 2 diabetes mellitus ([place]) 06/10/2022   Past Surgical History:  Procedure Laterality Date   AXILLARY LYMPH NODE BIOPSY Left 06/05/2021   Procedure: AXILLARY LYMPH  NODE BIOPSY;  Surgeon: Virl Cagey, MD;  Location: [facility] ORS;  Service: General;  Laterality: Left;   CARDIOVERSION     10/2017   CARDIOVERSION N/A 07/16/2020   Procedure: CARDIOVERSION;  Surgeon: Arnoldo Lenis, MD;  Location[facility];  Service: Endoscopy;  Laterality: N/A;   CARDIOVERSION N/A 08/21/2020   Procedure: CARDIOVERSION;  Surgeon: Satira Sark, MD;  Location: [facility] ORS;  Service: Cardiovascular;  Laterality: N/A;   CARDIOVERSION N/A 09/10/2020   Procedure: CARDIOVERSION;  Surgeon: Josue Hector, MD;  Location: [facility] ENDOSCOPY;  Service: Cardiovascular;  Laterality: N/A;   CHOLECYSTECTOMY     CLEFT LIP REPAIR     several from childhood til 76 years old   [place]     several from childhood til 76 years old   COLONOSCOPY  04/28/2011   Procedure: COLONOSCOPY;  Surgeon: Rogene Houston, MD;  [facility];  Service: Endoscopy;  Laterality: N/A;   COLONOSCOPY N/A 07/18/2014   Procedure: COLONOSCOPY;  Surgeon: Rogene Houston, MD;  [facility];  Service: Endoscopy;  Laterality: N/A;  830   COLONOSCOPY N/A 09/09/2017   Procedure: COLONOSCOPY;  Surgeon: Rogene Houston, MD;  Location: [facility];  Service: Endoscopy;  Laterality: N/A;  955   COSMETIC SURGERY  1940's &1950's   CYSTOSCOPY/URETEROSCOPY/HOLMIUM LASER/STENT PLACEMENT Bilateral 12/06/2017   Procedure: CYSTOSCOPY/RETROGRADE/URETEROSCOPY/HOLMIUM LASER/STENT EXCHANGE;  Surgeon: Festus Aloe, MD;  [facility];  Service: Urology;  Laterality: Bilateral;  ONLY NEEDS 60 MIN   HERNIA REPAIR     LYMPH NODE BIOPSY Left    Under left arm   POLYPECTOMY  09/09/2017   Procedure: POLYPECTOMY;  Surgeon: Rogene Houston, MD;  Location[facility];  Service: Endoscopy;;  colon   vocal cord surgery     Social History   Socioeconomic History   Marital status: Married    Spouse name: Not on file   Number of children: 2   Years of education: Not on file   Highest  education level: Not on file  Occupational History   Occupation: retired  Tobacco Use   Smoking status: Never   Smokeless tobacco: Never   Tobacco comments:    Never smoke 11/3021  Vaping Use   Vaping Use: Never used  Substance and Sexual Activity   Alcohol use: Not Currently   Drug use: No   Sexual activity: Not Currently  Other Topics Concern   Not on file  Social History Narrative   Lives with wife [name] of 55 years April 15, 2020      2 children, 2 grandchildren   Dog: Gardner Candle      Enjoys: being outdoors       Diet: eats all food groups    Caffeine: drink coffee in the morning, some tea, diet dr pepper   Water: 3-4 cups daily         Wears seat belt  Does not use phone while driving   [place] put up not loaded          Social Determinants of Health   Financial Resource Strain: Low Risk  (03/18/2022)   Overall Financial Resource Strain (CARDIA)    Difficulty of Paying Living Expenses: Not hard at all  Food Insecurity: No Food Insecurity (06/09/2022)   Hunger Vital Sign    Worried About Running Out of Food in the Last Year: Never true    Ran Out of Food in the Last Year: Never true  Transportation Needs: No Transportation Needs (06/09/2022)   PRAPARE - Hydrologist (Medical): No    Lack of Transportation (Non-Medical): No  Physical Activity: Insufficiently Active (03/18/2022)   Exercise Vital Sign    Days of Exercise per Week: 6 days    Minutes of Exercise per Session: 20 min  Stress: No Stress Concern Present (03/18/2022)   [place]    Feeling of Stress : Not at all  Social Connections: Unknown (03/18/2022)   Social Connection and Isolation Panel [NHANES]    Frequency of Communication with Friends and Family: More than three times a week    Frequency of Social Gatherings with Friends and Family: Twice a week    Attends Religious Services:  Not on Advertising copywriter or Organizations: Yes    Attends Archivist Meetings: 1 to 4 times per year    Marital Status: Married   Family History  Problem Relation Age of Onset   Breast cancer Mother    Cancer Mother    Prostate cancer Father    Cancer Father    COPD Sister    Birth defects Son    Allergies  Allergen Reactions   Flomax [Tamsulosin Hcl] Nausea Only    Dizziness  Pt is on flomax   Heparin Other (See Comments)    Tingling in face and shortness of breath   Prior to Admission medications   Medication Sig Start Date End Date Taking? Authorizing Provider  acetaminophen (TYLENOL) 500 MG tablet Take 1,000 mg by mouth every 6 (six) hours as needed for moderate pain or headache.   Yes [provider]  amiodarone (PACERONE) 200 MG tablet TAKE 1 TABLET BY MOUTH ONCE A DAY. 03/05/22  Yes Fenton, Clint R, PA  amLODipine (NORVASC) 10 MG tablet TAKE 1 TABLET BY MOUTH ONCE DAILY. 11/11/21  Yes Branch, Alphonse Guild, MD  apixaban (ELIQUIS) 5 MG TABS tablet TAKE ONE TABLET BY MOUTH 2 TIMES A DAY. 12/21/21  Yes Branch, Alphonse Guild, MD  cetirizine (ZYRTEC) 10 MG tablet Take 1 tablet (10 mg total) by mouth daily as needed for allergies. 01/01/20  Yes Corum, Rex Kras, MD  furosemide (LASIX) 20 MG tablet Take 1 tablet (20 mg total) by mouth every other day. (May take an additional tab as needed.) 01/21/22  Yes Branch, Alphonse Guild, MD  hydrALAZINE (APRESOLINE) 100 MG tablet Take 1 tablet (100 mg total) by mouth 2 (two) times daily. 05/31/22  Yes BranchAlphonse Guild, MD  isosorbide mononitrate (IMDUR) 30 MG 24 hr tablet TAKE 1/2 TABLET BY MOUTH ONCE DAILY. 11/11/21  Yes BranchAlphonse Guild, MD  lenalidomide (REVLIMID) 10 MG capsule Take 10 mg by mouth. Take 10 mg on days 1-21 of a 28 day cycle. 07/13/21  Yes [provider]  omeprazole (PRILOSEC) 20 MG capsule Take 1 capsule (  20 mg total) by mouth daily as needed (reflux). 06/15/21  Yes [name], MD  potassium  chloride SA (KLOR-CON M) 20 MEQ tablet TAKE ONE TABLET BY MOUTH ONCE DAILY. 05/07/22  Yes Branch, Alphonse Guild, MD  VENTOLIN HFA 108 [phone] Base) MCG/ACT inhaler Inhale 2 puffs into the lungs every 6 (six) hours as needed for wheezing or shortness of breath. 07/11/20  Yes Fayrene Helper, MD  blood glucose meter kit and supplies KIT Inject 1 each into the skin 4 (four) times daily -  before meals and at bedtime. Dispense based on patient and insurance preference. Use up to four times daily as directed. 06/02/21   Barb Merino, MD  Continuous Blood Gluc Receiver (FREESTYLE LIBRE 2 READER) DEVI Use to monitor blood glucose continuously 06/09/21   [provider]  Continuous Blood Gluc Sensor (FREESTYLE LIBRE 2 SENSOR) MISC USE TO MONITOR BLOOD SUGAR DAILY. (CHANGE EVERY 2WEEKS) 12/14/21   [name], MD  glucose blood (ONETOUCH ULTRA) test strip 1 each by Other route 4 (four) times daily. 10/27/21   [name], MD  insulin lispro (HUMALOG KWIKPEN) 100 UNIT/ML KwikPen Inject 1-10 Units into the skin 4 (four) times daily -  before meals and at bedtime. Blood sugars 70-120: 1 unit 121-150: 2 units 151-200: 3 units 201-250: 4 units 251-300: 5 units 301-350: 6 units 351-400: 9 units More than 400, 10 units and call your doctor Patient not taking: Reported on 06/09/2022 06/02/21   Barb Merino, MD  Insulin Pen Needle 29G X 12MM MISC 1 each by Does not apply route 4 (four) times daily -  before meals and at bedtime. 07/09/21   Fayrene Helper, MD  Lancets (ONETOUCH DELICA PLUS [id number]) [place] 1 each by Other route 4 (four) times daily. 06/02/21   [provider]   DG Hip Unilat W or Wo Pelvis 2-3 Views Left  Result Date: 06/09/2022 CLINICAL DATA:  Recent fall with left hip pain, initial encounter EXAM: DG HIP (WITH OR WITHOUT PELVIS) 3V LEFT COMPARISON:  None Available. FINDINGS: Comminuted left intratrochanteric fracture is noted with mild impaction at the fracture site. No dislocation  is noted. The pelvic ring appears intact. No other focal abnormality is noted. IMPRESSION: Comminuted left intratrochanteric fracture. Electronically Signed   By: Inez Catalina M.D.   On: 06/09/2022 18:59     Family History Reviewed and non-contributory, no pertinent history of problems with bleeding or anesthesia    Review of Systems No fevers or chills No numbness or tingling No chest pain No shortness of breath No bowel or bladder dysfunction No GI distress No headaches    OBJECTIVE  Vitals:Patient Vitals for the past 8 hrs:  BP Temp Temp src Pulse Resp SpO2  06/10/22 0950 108/61 98.6 F (37 C) Oral 60 16 100 %  06/10/22 0627 -- 98.2 F (36.8 C) Oral 78 18 100 %  06/10/22 0600 (!) 148/78 -- -- -- -- --  06/10/22 0311 121/70 98.6 F (37 C) Oral 60 17 100 %   General: Alert, no acute distress Cardiovascular: Extremities are warm Respiratory: No cyanosis, no use of accessory musculature Skin: No lesions in the area of chief complaint  Neurologic: Sensation intact distally  Psychiatric: Patient is competent for consent with normal mood and affect Lymphatic: No swelling obvious and reported other than the area involved in the exam below Extremities  LLE: Extremity held in a fixed position.  ROM deferred due to known fracture.  Sensation is  intact distally in the sural, saphenous, DP, SP, and plantar nerve distribution. 2+ DP pulse.  Toes are WWP.  Active motion intact in the TA/EHL/GS. RLE: Sensation is intact distally in the sural, saphenous, DP, SP, and plantar nerve distribution. 2+ DP pulse.  Toes are WWP.  Active motion intact in the TA/EHL/GS. Tolerates gentle ROM of the hip.  No pain with axial loading.     Test Results Imaging XR of the Left hip demonstrates an unstable Intertrochanteric femur fracture.  Labs cbc Recent Labs    06/09/22 1855 06/10/22 0913  WBC 5.9 6.3  HGB 11.9* 10.6*  HCT 34.4* 32.3*  PLT 148* 121*    Recent Labs    06/09/22 1855  06/10/22 0913  NA 138 139  K 3.4* 4.6  CL 109 112*  CO2 19* 23  GLUCOSE 107* 113*  BUN 19 21  CREATININE 1.79* 1.89*  CALCIUM 8.4* 8.3*

## 2022-06-10 NOTE — Consult Note (Signed)
Consultation Note Date: 06/10/2022   Patient Name: Darius Smith  DOB: 03-01-1946  MRN: [id number]  Age / Sex: 76 y.o., male  PCP: [name], MD Referring Physician: Rodena Goldmann, DO  Reason for Consultation: Establishing goals of care  HPI/Patient Profile: 76 y.o. male  with past medical history of non-Hodgkin's lymphoma in remission, CHF, basal cell cancer, CKD 3, DM 2, A-fib on Eliquis, HTN, GERD, admitted on 06/09/2022 with left hip fracture.   Clinical Assessment and Goals of Care: I have reviewed medical records including EPIC notes, labs and imaging, received report from RN, assessed the patient.  Mr. Portal is resting quietly in bed.  He appears relatively comfortable.  He is alert and oriented, able to make his basic needs known.  His wife, [name], and soon-to-be daughter-in-law are present at bedside.  We meet at bedside to discuss diagnosis prognosis, GOC, EOL wishes, disposition and options. I introduced Palliative Medicine as specialized medical care for people living with serious illness. It focuses on providing relief from the symptoms and stress of a serious illness. The goal is to improve quality of life for both the patient and the family.  We focused on their current illness.  We talk about Mr. Carton acute hip fracture and the plan for surgical repair tomorrow.  We talk about pain management which seems adequate at this point.  We also talk about bowel regimen.  Mr. Fedewa does not usually need assistance with BMs.  He is last BM was yesterday.  Bowel regimen added due to use of narcotics, immobility, anticipation of surgery.  The natural disease trajectory and expectations at EOL were discussed.  Advanced directives, concepts specific to code status, were considered and discussed.  Syndrome wife endorse DNR status.  Orders adjusted.  I shared that DNR is suspended during surgical procedures,  but will be reinstated afterwards.  Discussed the importance of continued conversation with family and the medical providers regarding overall plan of care and treatment options, ensuring decisions are within the context of the patient's values and GOCs.  Questions and concerns were addressed.  The patient and family were encouraged to call with questions or concerns.  PMT will continue to support holistically.  Conference with attending, bedside nursing staff, transition of care team related to patient condition, needs, goals of care, disposition.   HCPOA HCPOA -wife, Winston Misner, is healthcare surrogate.    SUMMARY OF RECOMMENDATIONS   At this point continue to treat the treatable but no CPR or intubation Scheduled for left hip repair 9/22 Anticipate need for short-term rehab, [facility] provider of choice   Code Status/Advance Care Planning: DNR  Symptom Management:  Bowel regimen added Pain management adequate  Palliative Prophylaxis:  Bowel Regimen, Frequent Pain Assessment, Oral Care, and Turn Reposition  Additional Recommendations (Limitations, Scope, Preferences): Continue to treat the treatable but no CPR or intubation  Psycho-social/Spiritual:  Desire for further Chaplaincy support:no Additional Recommendations: Caregiving  Support/Resources  Prognosis:  Unable to determine, based on outcomes.  1 year or  more would not be surprising based on good functional status prior to fall.  Discharge Planning:  Anticipate need for STR then home.        Primary Diagnoses: Present on Admission:  Closed comminuted intertrochanteric fracture of left femur, initial encounter ([place])  Atrial fibrillation ([place])  Essential hypertension  GERD (gastroesophageal reflux disease)  Stage 3b chronic kidney disease ([place])   I have reviewed the medical record, interviewed the patient and family, and examined the patient. The following aspects are pertinent.  Past Medical History:  Diagnosis  Date   Allergic rhinitis 07/02/2019   Allergy 2015   Arthritis    both hands   Atrial fibrillation ([place]) 10/28/2017   Dr. Harl Bowie   Atrial fibrillation with RVR ([place]) 07/14/2020   Blood in urine    occ   Bronchitis    Cancer ([place])    Basal cell   Cardiomegaly 10/28/2017   CHF (congestive heart failure) (HCC)    Clotting disorder ([place])    Complication of anesthesia    Mr. Guerreiro developed a. fib in recovery after cysto procedure 10/2017 was transferred to [facility] and underwent successful cardioversion   COPD (chronic obstructive pulmonary disease) ([place]) 06/10/2022   Diabetes ([place])    Diverticulosis of sigmoid colon 08/2017   Noted on colonoscopy   Dysrhythmia    External hemorrhoids 08/2017   GERD (gastroesophageal reflux disease)    History of colon polyps 08/2017   History of kidney stones    History of rectal bleeding    History of right inguinal hernia    Hypertension    Lung cancer ([place]) 06/25/2020   Lymphoma ([place])    Nephrolithiasis 07/02/2019   Normocytic anemia 06/10/2022   Rectal bleeding 09/02/2017   Added automatically from request for surgery [id number]   Shortness of breath    Stage 3 chronic kidney disease ([place])    Type 2 diabetes mellitus ([place]) 06/10/2022   Social History   Socioeconomic History   Marital status: Married    Spouse name: Not on file   Number of children: 2   Years of education: Not on file   Highest education level: Not on file  Occupational History   Occupation: retired  Tobacco Use   Smoking status: Never   Smokeless tobacco: Never   Tobacco comments:    Never smoke 11/3021  Vaping Use   Vaping Use: Never used  Substance and Sexual Activity   Alcohol use: Not Currently   Drug use: No   Sexual activity: Not Currently  Other Topics Concern   Not on file  Social History Narrative   Lives with wife [name] of 21 years April 15, 2020      2 children, 2 grandchildren   Dog: Gardner Candle      Enjoys: being outdoors       Diet: eats  all food groups    Caffeine: drink coffee in the morning, some tea, diet dr pepper   Water: 3-4 cups daily         Wears seat belt    Does not use phone while driving   [place] put up not loaded          Social Determinants of Health   Financial Resource Strain: [place]  (03/18/2022)   Overall Financial Resource Strain (CARDIA)    Difficulty of Paying Living Expenses: Not hard at all  Food Insecurity: No Food Insecurity (06/09/2022)   Hunger Vital Sign  Worried About Charity fundraiser in the Last Year: Never true    [place] in the Last Year: Never true  Transportation Needs: No Transportation Needs (06/09/2022)   PRAPARE - Hydrologist (Medical): No    Lack of Transportation (Non-Medical): No  Physical Activity: Insufficiently Active (03/18/2022)   Exercise Vital Sign    Days of Exercise per Week: 6 days    Minutes of Exercise per Session: 20 min  Stress: No Stress Concern Present (03/18/2022)   [place]    Feeling of Stress : Not at all  Social Connections: Unknown (03/18/2022)   Social Connection and Isolation Panel [NHANES]    Frequency of Communication with Friends and Family: More than three times a week    Frequency of Social Gatherings with Friends and Family: Twice a week    Attends Religious Services: Not on Advertising copywriter or Organizations: Yes    Attends Archivist Meetings: 1 to 4 times per year    Marital Status: Married   Family History  Problem Relation Age of Onset   Breast cancer Mother    Cancer Mother    Prostate cancer Father    Cancer Father    COPD Sister    Birth defects Son    Scheduled Meds:  amiodarone  200 mg Oral Daily   amLODipine  10 mg Oral Daily   furosemide  20 mg Oral QODAY   hydrALAZINE  100 mg Oral BID   insulin aspart  0-9 Units Subcutaneous TID WC   lenalidomide  10 mg Oral QHS    pantoprazole  40 mg Oral Daily   Continuous Infusions:  [START ON 06/11/2022]  ceFAZolin (ANCEF) IV     [START ON 06/11/2022] tranexamic acid     PRN Meds:.albuterol, HYDROmorphone (DILAUDID) injection, ondansetron **OR** ondansetron (ZOFRAN) IV Medications Prior to Admission:  Prior to Admission medications   Medication Sig Start Date End Date Taking? Authorizing Provider  acetaminophen (TYLENOL) 500 MG tablet Take 1,000 mg by mouth every 6 (six) hours as needed for moderate pain or headache.   Yes [provider]  amiodarone (PACERONE) 200 MG tablet TAKE 1 TABLET BY MOUTH ONCE A Darius. 03/05/22  Yes Fenton, Clint R, PA  amLODipine (NORVASC) 10 MG tablet TAKE 1 TABLET BY MOUTH ONCE DAILY. 11/11/21  Yes Branch, Alphonse Guild, MD  apixaban (ELIQUIS) 5 MG TABS tablet TAKE ONE TABLET BY MOUTH 2 TIMES A Darius. 12/21/21  Yes Branch, Alphonse Guild, MD  cetirizine (ZYRTEC) 10 MG tablet Take 1 tablet (10 mg total) by mouth daily as needed for allergies. 01/01/20  Yes Corum, Rex Kras, MD  furosemide (LASIX) 20 MG tablet Take 1 tablet (20 mg total) by mouth every other Darius. (May take an additional tab as needed.) 01/21/22  Yes Branch, Alphonse Guild, MD  hydrALAZINE (APRESOLINE) 100 MG tablet Take 1 tablet (100 mg total) by mouth 2 (two) times daily. 05/31/22  Yes BranchAlphonse Guild, MD  isosorbide mononitrate (IMDUR) 30 MG 24 hr tablet TAKE 1/2 TABLET BY MOUTH ONCE DAILY. 11/11/21  Yes BranchAlphonse Guild, MD  lenalidomide (REVLIMID) 10 MG capsule Take 10 mg by mouth. Take 10 mg on days 1-21 of a 28 Darius cycle. 07/13/21  Yes [provider]  omeprazole (PRILOSEC) 20 MG capsule Take 1 capsule (20 mg total) by mouth daily as needed (reflux). 06/15/21  Yes  [name], MD  potassium chloride SA (KLOR-CON M) 20 MEQ tablet TAKE ONE TABLET BY MOUTH ONCE DAILY. 05/07/22  Yes Branch, Alphonse Guild, MD  VENTOLIN HFA 108 [phone] Base) MCG/ACT inhaler Inhale 2 puffs into the lungs every 6 (six) hours as needed for wheezing or  shortness of breath. 07/11/20  Yes Fayrene Helper, MD  blood glucose meter kit and supplies KIT Inject 1 each into the skin 4 (four) times daily -  before meals and at bedtime. Dispense based on patient and insurance preference. Use up to four times daily as directed. 06/02/21   Barb Merino, MD  Continuous Blood Gluc Receiver (FREESTYLE LIBRE 2 READER) DEVI Use to monitor blood glucose continuously 06/09/21   [provider]  Continuous Blood Gluc Sensor (FREESTYLE LIBRE 2 SENSOR) MISC USE TO MONITOR BLOOD SUGAR DAILY. (CHANGE EVERY 2WEEKS) 12/14/21   [name], MD  glucose blood (ONETOUCH ULTRA) test strip 1 each by Other route 4 (four) times daily. 10/27/21   [name], MD  insulin lispro (HUMALOG KWIKPEN) 100 UNIT/ML KwikPen Inject 1-10 Units into the skin 4 (four) times daily -  before meals and at bedtime. Blood sugars 70-120: 1 unit 121-150: 2 units 151-200: 3 units 201-250: 4 units 251-300: 5 units 301-350: 6 units 351-400: 9 units More than 400, 10 units and call your doctor Patient not taking: Reported on 06/09/2022 06/02/21   Barb Merino, MD  Insulin Pen Needle 29G X 12MM MISC 1 each by Does not apply route 4 (four) times daily -  before meals and at bedtime. 07/09/21   Fayrene Helper, MD  Lancets (ONETOUCH DELICA PLUS [id number]) [place] 1 each by Other route 4 (four) times daily. 06/02/21   [provider]   Allergies  Allergen Reactions   Flomax [Tamsulosin Hcl] Nausea Only    Dizziness  Pt is on flomax   Heparin Other (See Comments)    Tingling in face and shortness of breath   Review of Systems  Unable to perform ROS: Acuity of condition    Physical Exam Vitals and nursing note reviewed.  Constitutional:      General: He is not in acute distress.    Appearance: He is not ill-appearing.  HENT:     Mouth/Throat:     Mouth: Mucous membranes are moist.  Cardiovascular:     Rate and Rhythm: Normal rate.  Pulmonary:     Effort: Pulmonary  effort is normal. No respiratory distress.  Skin:    General: Skin is warm and dry.  Neurological:     Mental Status: He is alert and oriented to person, place, and time.     Vital Signs: BP 113/75   Pulse 60   Temp 98.6 F (37 C) (Oral)   Resp 16   Ht 6' (1.829 m)   Wt 81.3 kg   SpO2 100%   BMI 24.31 kg/m  Pain Scale: 0-10   Pain Score: 3    SpO2: SpO2: 100 % O2 Device:SpO2: 100 % O2 Flow Rate: .O2 Flow Rate (L/min): 4 L/min  IO: Intake/output summary:  Intake/Output Summary (Last 24 hours) at 06/10/2022 1336 Last data filed at 06/10/2022 0900 Gross per 24 hour  Intake 480 ml  Output 200 ml  Net 280 ml    LBM: Last BM Date : 06/09/22 Baseline Weight: Weight: 79.4 kg Most recent weight: Weight: 81.3 kg     Palliative Assessment/Data:   Administrator, Civil Service Row Most  Recent Value  Intake Tab   Referral Department Hospitalist  Unit at Time of Referral Cardiac/Telemetry Unit  Palliative Care Primary Diagnosis Trauma  Date Notified 06/10/22  Palliative Care Type Return patient Palliative Care  Reason for referral Clarify Goals of Care  Date of Admission 06/09/22  Date first seen by Palliative Care 06/10/22  # of days Palliative referral response time 0 Darius(s)  # of days IP prior to Palliative referral 1  Clinical Assessment   Palliative Performance Scale Score 20%  Pain Max last 24 hours Not able to report  Pain Min Last 24 hours Not able to report  Dyspnea Max Last 24 Hours Not able to report  Dyspnea Min Last 24 hours Not able to report  Psychosocial & Spiritual Assessment   Palliative Care Outcomes        Time In: 0830 Time Out: 0925 Time Total: 55 minutes  Greater than 50%  of this time was spent counseling and coordinating care related to the above assessment and plan.  Signed by: Drue Novel, NP   Please contact Palliative Medicine Team phone at [phone] for questions and concerns.  For individual provider: See Shea Evans

## 2022-06-10 NOTE — Progress Notes (Signed)
PROGRESS NOTE    Darius Smith  XLK:[id number] DOB: [birth year] DOA: 06/09/2022 PCP: [name], MD   Brief Narrative:    Darius Smith is a 76 y.o. male with medical history significant of non-Hodgkin's lymphoma in remission, T2DM, A-fib on Eliquis, hypertension, GERD who presents to the emergency department via EMS after sustaining a fall at home.  He was noted to have a comminuted left intertrochanteric fracture and is awaiting operative intervention in a.m.  Home Eliquis has been held.  Assessment & Plan:   Principal Problem:   Closed comminuted intertrochanteric fracture of left femur, initial encounter ([place]) Active Problems:   Essential hypertension   GERD (gastroesophageal reflux disease)   Atrial fibrillation (HCC)   Non Hodgkin's lymphoma (HCC)   Chronic combined systolic and diastolic CHF (congestive heart failure) (HCC)   Stage 3b chronic kidney disease (HCC)   Hypokalemia   Thrombocytopenia (HCC)   Normocytic anemia   Type 2 diabetes mellitus (HCC)   COPD (chronic obstructive pulmonary disease) (HCC)  Assessment and Plan:   Comminuted left intratrochanteric fracture Left hip x-ray showed comminuted left intratrochanteric fracture Continue fall precaution and neurochecks Continue Dilaudid as needed Orthopedic surgeon already consulted and will see patient in the morning per ED physician Consider PT/OT eval and treat status post surgical repair    Chronic thrombocytopenia Platelets are 121, continue to monitor platelet levels   Normocytic anemia Hemoglobin at 11.9 (this was 11.7 on September 27, 2021) Continue to monitor H/H recommended labs   CKD stage IIIb BUN/creatinine 19/1.79 (creatinine is within baseline range) Renally adjust medications, avoid nephrotoxic agents/dehydration/hypotension   A-fib on Eliquis CHA2DS2-VASc score of at least 4 Continue amiodarone Eliquis will be held at this time and aspirin for possible surgical repair of the left  hip   T2DM Continue ISS and hypoglycemic protocol   Essential hypertension Continue amlodipine, hydralazine   GERD Continue Protonix   Non-Hodgkin's lymphoma Continue lenalidomide   Chronic combined systolic and diastolic CHF Echocardiogram done on 11/26/2020 showed LVEF of 50%.  LV has mildly decreased function.  G1 DD Continue home Lasix   COPD (not in acute exacerbation) Continue Ventolin   Goals of care Palliative care will be consulted and we shall await further recommendations    DVT prophylaxis: SCDs Code Status: Full Family Communication: Wife at bedside 9/21 Disposition Plan:  Status is: Inpatient Remains inpatient appropriate because: Need for IV medications  Consultants:  Orthopedics  Procedures:  None  Antimicrobials:  None   Subjective: Patient seen and evaluated today with no new acute complaints or concerns. No acute concerns or events noted overnight.  Objective: Vitals:   06/10/22 0600 06/10/22 0627 06/10/22 0950 06/10/22 1051  BP: (!) 148/78  108/61 113/75  Pulse:  78 60   Resp:  18 16   Temp:  98.2 F (36.8 C) 98.6 F (37 C)   TempSrc:  Oral Oral   SpO2:  100% 100%   Weight:      Height:        Intake/Output Summary (Last 24 hours) at 06/10/2022 1127 Last data filed at 06/10/2022 0900 Gross per 24 hour  Intake 480 ml  Output 200 ml  Net 280 ml   Filed Weights   06/09/22 1723 06/09/22 2337  Weight: 79.4 kg 81.3 kg    Examination:  General exam: Appears calm and comfortable  Respiratory system: Clear to auscultation. Respiratory effort normal. Cardiovascular system: S1 & S2 heard, RRR.  Gastrointestinal system: Abdomen is soft  Central nervous system: Alert and awake Extremities: No edema Skin: No significant lesions noted Psychiatry: Flat affect.    Data Reviewed: I have personally reviewed following labs and imaging studies  CBC: Recent Labs  Lab 06/09/22 1855 06/10/22 0913  WBC 5.9 6.3  NEUTROABS 4.3  --    HGB 11.9* 10.6*  HCT 34.4* 32.3*  MCV 92.5 97.6  PLT 148* 103*   Basic Metabolic Panel: Recent Labs  Lab 06/09/22 1855 06/10/22 0913  NA 138 139  K 3.4* 4.6  CL 109 112*  CO2 19* 23  GLUCOSE 107* 113*  BUN 19 21  CREATININE 1.79* 1.89*  CALCIUM 8.4* 8.3*  MG  --  1.9  PHOS  --  4.6   GFR: Estimated Creatinine Clearance: 36.5 mL/min (A) (by C-G formula based on SCr of 1.89 mg/dL (H)). Liver Function Tests: Recent Labs  Lab 06/10/22 0913  AST 13*  ALT 15  ALKPHOS 57  BILITOT 1.3*  PROT 5.7*  ALBUMIN 3.2*   No results for input(s): "LIPASE", "AMYLASE" in the last 168 hours. No results for input(s): "AMMONIA" in the last 168 hours. Coagulation Profile: No results for input(s): "INR", "PROTIME" in the last 168 hours. Cardiac Enzymes: No results for input(s): "CKTOTAL", "CKMB", "CKMBINDEX", "TROPONINI" in the last 168 hours. BNP (last 3 results) No results for input(s): "PROBNP" in the last 8760 hours. HbA1C: No results for input(s): "HGBA1C" in the last 72 hours. CBG: Recent Labs  Lab 06/10/22 0722 06/10/22 1109  GLUCAP 107* 114*   Lipid Profile: No results for input(s): "CHOL", "HDL", "LDLCALC", "TRIG", "CHOLHDL", "LDLDIRECT" in the last 72 hours. Thyroid Function Tests: No results for input(s): "TSH", "T4TOTAL", "FREET4", "T3FREE", "THYROIDAB" in the last 72 hours. Anemia Panel: No results for input(s): "VITAMINB12", "FOLATE", "FERRITIN", "TIBC", "IRON", "RETICCTPCT" in the last 72 hours. Sepsis Labs: No results for input(s): "PROCALCITON", "LATICACIDVEN" in the last 168 hours.  No results found for this or any previous visit (from the past 240 hour(s)).       Radiology Studies: DG Hip Unilat W or Wo Pelvis 2-3 Views Left  Result Date: 06/09/2022 CLINICAL DATA:  Recent fall with left hip pain, initial encounter EXAM: DG HIP (WITH OR WITHOUT PELVIS) 3V LEFT COMPARISON:  None Available. FINDINGS: Comminuted left intratrochanteric fracture is noted  with mild impaction at the fracture site. No dislocation is noted. The pelvic ring appears intact. No other focal abnormality is noted. IMPRESSION: Comminuted left intratrochanteric fracture. Electronically Signed   By: Inez Catalina M.D.   On: 06/09/2022 18:59        Scheduled Meds:  amiodarone  200 mg Oral Daily   amLODipine  10 mg Oral Daily   furosemide  20 mg Oral QODAY   hydrALAZINE  100 mg Oral BID   insulin aspart  0-9 Units Subcutaneous TID WC   lenalidomide  10 mg Oral QHS   pantoprazole  40 mg Oral Daily   potassium chloride SA  20 mEq Oral Daily   Continuous Infusions:  [START ON 06/11/2022]  ceFAZolin (ANCEF) IV     [START ON 06/11/2022] tranexamic acid       LOS: 1 day    Time spent: 35 minutes    Anny Sayler Darleen Crocker, DO [facility]  If 7PM-7AM, please contact night-coverage [email or web address] 06/10/2022, 11:27 AM

## 2022-06-10 NOTE — TOC Progression Note (Signed)
Transition of Care [facility]) - Progression Note    Patient Details  Name: Darius Smith MRN: [id number] Date of Birth: 1946-01-11  Transition of Care [facility]) CM/SW Contact  Salome Arnt, [place] Phone Number: 06/10/2022, 9:51 AM  Clinical Narrative:   Transition of Care [facility]) Screening Note   Patient Details  Name: Darius Smith Date of Birth: 01-11-1946   Transition of Care [facility]) CM/SW Contact:    Salome Arnt, [place] Phone Number: 06/10/2022, 9:51 AM  Pt admitted due to hip fracture. Surgery scheduled for tomorrow. TOC will follow up after PT evaluation.     Transition of Care Department [facility]) has reviewed patient and no TOC needs have been identified at this time. We will continue to monitor patient advancement through interdisciplinary progression rounds. If new patient transition needs arise, please place a TOC consult.         Barriers to Discharge: Continued Medical Work up  Expected Discharge Plan and Services                                                 Social Determinants of Health (SDOH) Interventions Housing Interventions: Intervention Not Indicated  Readmission Risk Interventions    08/04/2020    4:53 PM 08/01/2020    9:34 AM  Readmission Risk Prevention Plan  Transportation Screening Complete Complete  HRI or Home Care Consult Complete Complete  Social Work Consult for [place] Planning/Counseling Complete Complete  Palliative Care Screening Complete Not Applicable  Medication Review Press photographer) Complete Complete

## 2022-06-10 NOTE — Progress Notes (Signed)
EKG done and given to nurse 

## 2022-06-11 ENCOUNTER — Inpatient Hospital Stay (HOSPITAL_COMMUNITY): Payer: Medicare Other | Admitting: Certified Registered"

## 2022-06-11 ENCOUNTER — Encounter (HOSPITAL_COMMUNITY): Payer: Self-pay | Admitting: Internal Medicine

## 2022-06-11 ENCOUNTER — Encounter (HOSPITAL_COMMUNITY)
Admission: EM | Disposition: A | Discharge status: Skilled Nursing Facility | Payer: Self-pay | Source: Home / Self Care | Attending: Internal Medicine

## 2022-06-11 ENCOUNTER — Inpatient Hospital Stay (HOSPITAL_COMMUNITY): Payer: Medicare Other

## 2022-06-11 ENCOUNTER — Other Ambulatory Visit: Payer: Self-pay

## 2022-06-11 DIAGNOSIS — J449 Chronic obstructive pulmonary disease, unspecified: Secondary | ICD-10-CM

## 2022-06-11 DIAGNOSIS — S72142A Displaced intertrochanteric fracture of left femur, initial encounter for closed fracture: Secondary | ICD-10-CM

## 2022-06-11 DIAGNOSIS — I10 Essential (primary) hypertension: Secondary | ICD-10-CM

## 2022-06-11 HISTORY — PX: INTRAMEDULLARY (IM) NAIL INTERTROCHANTERIC: SHX5875

## 2022-06-11 LAB — CBC
HCT: 30.4 % — ABNORMAL LOW (ref 39.0–52.0)
Hemoglobin: 10.1 g/dL — ABNORMAL LOW (ref 13.0–17.0)
MCH: 31.9 pg (ref 26.0–34.0)
MCHC: 33.2 g/dL (ref 30.0–36.0)
MCV: 95.9 fL (ref 80.0–100.0)
Platelets: 114 10*3/uL — ABNORMAL LOW (ref 150–400)
RBC: 3.17 MIL/uL — ABNORMAL LOW (ref 4.22–5.81)
RDW: 15.1 % (ref 11.5–15.5)
WBC: 5.5 10*3/uL (ref 4.0–10.5)
nRBC: 0 % (ref 0.0–0.2)

## 2022-06-11 LAB — GLUCOSE, CAPILLARY
Glucose-Capillary: 106 mg/dL — ABNORMAL HIGH (ref 70–99)
Glucose-Capillary: 96 mg/dL (ref 70–99)
Glucose-Capillary: 118 mg/dL — ABNORMAL HIGH (ref 70–99)
Glucose-Capillary: 142 mg/dL — ABNORMAL HIGH (ref 70–99)
Glucose-Capillary: 194 mg/dL — ABNORMAL HIGH (ref 70–99)

## 2022-06-11 LAB — BASIC METABOLIC PANEL
Anion gap: 8 (ref 5–15)
BUN: 18 mg/dL (ref 8–23)
CO2: 24 mmol/L (ref 22–32)
Calcium: 8.2 mg/dL — ABNORMAL LOW (ref 8.9–10.3)
Chloride: 104 mmol/L (ref 98–111)
Creatinine, Ser: 1.64 mg/dL — ABNORMAL HIGH (ref 0.61–1.24)
GFR, Estimated: 43 mL/min — ABNORMAL LOW (ref >60)
Glucose, Bld: 100 mg/dL — ABNORMAL HIGH (ref 70–99)
Potassium: 3.8 mmol/L (ref 3.5–5.1)
Sodium: 136 mmol/L (ref 135–145)

## 2022-06-11 LAB — MAGNESIUM: Magnesium: 1.9 mg/dL (ref 1.7–2.4)

## 2022-06-11 SURGERY — FIXATION, FRACTURE, INTERTROCHANTERIC, WITH INTRAMEDULLARY ROD
Anesthesia: General | Site: Hip | Laterality: Left

## 2022-06-11 MED ORDER — LIDOCAINE HCL (PF) 2 % IJ SOLN
INTRAMUSCULAR | Status: AC
  Filled 2022-06-11: qty 5

## 2022-06-11 MED ORDER — OXYCODONE HCL 5 MG/5ML PO SOLN: 5.0000 mg | Freq: Once | ORAL | Status: DC | PRN

## 2022-06-11 MED ORDER — LIDOCAINE 2% (20 MG/ML) 5 ML SYRINGE
INTRAMUSCULAR | Status: DC | PRN
  Administered 2022-06-11: 80 mg via INTRAVENOUS

## 2022-06-11 MED ORDER — ONDANSETRON HCL 4 MG/2ML IJ SOLN: 4.0000 mg | Freq: Once | INTRAMUSCULAR | Status: DC | PRN

## 2022-06-11 MED ORDER — PROPOFOL 10 MG/ML IV BOLUS
INTRAVENOUS | Status: AC
  Filled 2022-06-11: qty 20

## 2022-06-11 MED ORDER — FENTANYL CITRATE (PF) 100 MCG/2ML IJ SOLN
INTRAMUSCULAR | Status: DC | PRN
  Administered 2022-06-11 (×2): 25 ug via INTRAVENOUS

## 2022-06-11 MED ORDER — CHLORHEXIDINE GLUCONATE 0.12 % MT SOLN
15.0000 mL | Freq: Once | OROMUCOSAL | Status: AC
  Administered 2022-06-11: 15 mL via OROMUCOSAL

## 2022-06-11 MED ORDER — DEXAMETHASONE SODIUM PHOSPHATE 10 MG/ML IJ SOLN
INTRAMUSCULAR | Status: DC | PRN
  Administered 2022-06-11: 10 mg via INTRAVENOUS

## 2022-06-11 MED ORDER — BUPIVACAINE HCL (PF) 0.5 % IJ SOLN
INTRAMUSCULAR | Status: DC | PRN
  Administered 2022-06-11: 30 mL

## 2022-06-11 MED ORDER — LACTATED RINGERS IV SOLN: INTRAVENOUS | Status: DC

## 2022-06-11 MED ORDER — FENTANYL CITRATE PF 50 MCG/ML IJ SOSY: 25.0000 ug | PREFILLED_SYRINGE | INTRAMUSCULAR | Status: DC | PRN

## 2022-06-11 MED ORDER — SODIUM CHLORIDE 0.9 % IR SOLN
Status: DC | PRN
  Administered 2022-06-11: 1000 mL

## 2022-06-11 MED ORDER — BUPIVACAINE HCL (PF) 0.5 % IJ SOLN
INTRAMUSCULAR | Status: AC
  Filled 2022-06-11: qty 30

## 2022-06-11 MED ORDER — FENTANYL CITRATE (PF) 100 MCG/2ML IJ SOLN
INTRAMUSCULAR | Status: AC
  Filled 2022-06-11: qty 2

## 2022-06-11 MED ORDER — PROPOFOL 10 MG/ML IV BOLUS
INTRAVENOUS | Status: DC | PRN
  Administered 2022-06-11: 150 mg via INTRAVENOUS

## 2022-06-11 MED ORDER — DEXAMETHASONE SODIUM PHOSPHATE 10 MG/ML IJ SOLN
INTRAMUSCULAR | Status: AC
  Filled 2022-06-11: qty 1

## 2022-06-11 MED ORDER — ONDANSETRON HCL 4 MG/2ML IJ SOLN
INTRAMUSCULAR | Status: DC | PRN
  Administered 2022-06-11: 4 mg via INTRAVENOUS

## 2022-06-11 MED ORDER — OXYCODONE HCL 5 MG PO TABS: 5.0000 mg | ORAL_TABLET | Freq: Once | ORAL | Status: DC | PRN

## 2022-06-11 MED ORDER — ONDANSETRON HCL 4 MG/2ML IJ SOLN
INTRAMUSCULAR | Status: AC
  Filled 2022-06-11: qty 2

## 2022-06-11 MED ORDER — CHLORHEXIDINE GLUCONATE 0.12 % MT SOLN
OROMUCOSAL | Status: AC
  Filled 2022-06-11: qty 15

## 2022-06-11 MED ORDER — ORAL CARE MOUTH RINSE: 15.0000 mL | Freq: Once | OROMUCOSAL | Status: AC

## 2022-06-11 SURGICAL SUPPLY — 53 items
APL PRP STRL LF DISP 70% ISPRP (MISCELLANEOUS) ×1
BIT DRILL 4.0X280 (BIT) IMPLANT
BLADE SURG SZ10 CARB STEEL (BLADE) ×1 IMPLANT
BNDG GAUZE ELAST 4 BULKY (GAUZE/BANDAGES/DRESSINGS) ×2 IMPLANT
BRUSH SCRUB EZ W/ULTRADEX 3%PC (MISCELLANEOUS) IMPLANT
CHLORAPREP W/TINT 26 (MISCELLANEOUS) ×1 IMPLANT
CLOTH BEACON ORANGE TIMEOUT ST (SAFETY) ×2 IMPLANT
COVER LIGHT HANDLE STERIS (MISCELLANEOUS) ×4 IMPLANT
COVER PERINEAL POST (MISCELLANEOUS) ×1 IMPLANT
DRAPE STERI IOBAN 125X83 (DRAPES) ×2 IMPLANT
DRSG MEPILEX SACRM 8.7X9.8 (GAUZE/BANDAGES/DRESSINGS) ×2 IMPLANT
DRSG PAD ABDOMINAL 8X10 ST (GAUZE/BANDAGES/DRESSINGS) ×1 IMPLANT
DRSG TEGADERM 4X4.75 (GAUZE/BANDAGES/DRESSINGS) ×8 IMPLANT
ELECT REM PT RETURN 9FT ADLT (ELECTROSURGICAL) ×1
ELECTRODE REM PT RTRN 9FT ADLT (ELECTROSURGICAL) ×2 IMPLANT
GAUZE SPONGE 4X4 12PLY STRL (GAUZE/BANDAGES/DRESSINGS) ×2 IMPLANT
GLOVE BIO SURGEON STRL SZ7 (GLOVE) IMPLANT
GLOVE BIO SURGEON STRL SZ8 (GLOVE) ×4 IMPLANT
GLOVE BIOGEL PI IND STRL 7.0 (GLOVE) ×2 IMPLANT
GLOVE BIOGEL PI IND STRL 8 (GLOVE) IMPLANT
GLOVE SURG SS PI 7.0 STRL IVOR (GLOVE) IMPLANT
GOWN STRL REUS W/ TWL XL LVL3 (GOWN DISPOSABLE) ×2 IMPLANT
GOWN STRL REUS W/TWL LRG LVL3 (GOWN DISPOSABLE) ×2 IMPLANT
GOWN STRL REUS W/TWL XL LVL3 (GOWN DISPOSABLE) ×1
GUIDEWIRE BALL NOSE 3.0X900 (WIRE) ×1
GUIDEWIRE ORTH 900X3XBALL NOSE (WIRE) IMPLANT
INST SET MAJOR BONE (KITS) ×2 IMPLANT
KIT BLADEGUARD II DBL (SET/KITS/TRAYS/PACK) ×1 IMPLANT
KIT TURNOVER CYSTO (KITS) ×2 IMPLANT
MANIFOLD NEPTUNE II (INSTRUMENTS) ×1 IMPLANT
MARKER SKIN DUAL TIP RULER LAB (MISCELLANEOUS) ×2 IMPLANT
NAIL LEFT 11X125X39ES (Nail) IMPLANT
NDL HYPO 21X1.5 SAFETY (NEEDLE) ×2 IMPLANT
NEEDLE HYPO 21X1.5 SAFETY (NEEDLE) ×1 IMPLANT
NS IRRIG 1000ML POUR BTL (IV SOLUTION) ×2 IMPLANT
PACK BASIC III (CUSTOM PROCEDURE TRAY) ×1
PACK SRG BSC III STRL LF ECLPS (CUSTOM PROCEDURE TRAY) ×2 IMPLANT
PAD ARMBOARD 7.5X6 YLW CONV (MISCELLANEOUS) ×1 IMPLANT
PENCIL SMOKE EVACUATOR COATED (MISCELLANEOUS) ×2 IMPLANT
PIN GUIDE THRD AR 3.2X330 (PIN) IMPLANT
SCREW LAG 10.5X110 (Screw) IMPLANT
SCREW LOCK CORT 5.0X40 (Screw) IMPLANT
SET BASIN LINEN APH (SET/KITS/TRAYS/PACK) ×2 IMPLANT
SPONGE T-LAP 18X18 ~~LOC~~+RFID (SPONGE) ×4 IMPLANT
STRIP CLOSURE SKIN 1/2X4 (GAUZE/BANDAGES/DRESSINGS) IMPLANT
SUT MNCRL AB 4-0 PS2 18 (SUTURE) IMPLANT
SUT MON AB 2-0 CT1 36 (SUTURE) ×1 IMPLANT
SUT VIC AB 0 CT1 27 (SUTURE) ×1
SUT VIC AB 0 CT1 27XBRD ANTBC (SUTURE) ×2 IMPLANT
SYR 30ML LL (SYRINGE) ×1 IMPLANT
SYR BULB IRRIG 60ML STRL (SYRINGE) ×4 IMPLANT
TOOL ACTIVATION (INSTRUMENTS) IMPLANT
YANKAUER SUCT BULB TIP 10FT TU (MISCELLANEOUS) ×1 IMPLANT

## 2022-06-11 NOTE — Op Note (Signed)
Orthopaedic Surgery Operative Note (CSN: [id number])  Darius Smith  [birth year] Date of Surgery: 06/11/2022   Diagnoses:  Left intertrochanteric femur fracture  Procedure: Cephalomedullary nail for Left intertrochanteric femur fracture   Operative Finding Successful completion of the planned procedure.  Placement of 390 mm x 11 mm x 125 degress cephalomedullary nail    Post-Op Diagnosis: Same Surgeons:Primary: Mordecai Rasmussen, MD Assistants:  None Location: [facility] OR ROOM 4 Anesthesia: General with local anesthesia Antibiotics: Ancef 2 g Tourniquet time: N/A Estimated Blood Loss: 734 cc Complications: None Specimens: None  Implants: Implant Name Type Inv. Item Serial No. Manufacturer Lot No. LRB No. Used Action  NAIL LEFT [id number] - [id number] Nail NAIL LEFT [id number]  [employer] STERILE ON SET Left 1 Implanted  SCREW LAG 10.5X110 - [id number] Screw SCREW LAG 10.5X110  [employer] STERILE ON SET Left 1 Implanted  SCREW LOCK CORT 5.0X40 - [id number] Screw SCREW LOCK CORT 5.0X40  [employer] STERILE ON SET Left 1 Implanted    Indications for Surgery:   Darius Smith is a 76 y.o. male who had a mechanical fall and sustained a Left intertrochanteric femur fracture.  I recommended operative fixation to restore stability and allow the patient to ambulate immediately postop.  Benefits and risks of operative and nonoperative management were discussed prior to surgery with the patient and informed consent form was completed.  Specific risks including infection, need for additional surgery, persistent pain, bleeding, malunion, nonunion and more severe complications associated with anesthesia.  The patient elected to proceed and surgical consent was obtained.    Procedure:   The patient was identified properly. Informed consent was obtained and the surgical site was marked. The patient was taken to the OR where general anesthesia was induced.   The patient was placed supine on a fracture  table and appropriate reduction was obtained and visualized on fluoroscopy prior to the beginning of the procedure.  Timeout was performed before the beginning of the case.  Ancef 2 g dosing was confirmed prior to making incision.  The patient received TXA prior to the start of surgery.   We made an incision proximal to the greater trochanter and dissected down through the fascia.  We then carefully placed a guidepin, localizing under fluoroscopy.  Once satisfied with the starting point, the entry reamer was used to gain entry into the intramedullary canal.  A ball tip guidewire was then introduced and passed to an appropriate level at the physeal scar of the distal femur and measurement was obtained proximally using fluoroscopy.  We selected the appropriate length of nail, as noted above.  Entry reamer was used needed but the nail was unreamed.  At this point we placed our nail localizing under fluoroscopy, and confirmed that it was at the appropriate level.  Next we used the outrigger device to pass a wire into the femoral neck, and then the cephalomedullary lag screw.  The screw was locked proximally to avoid over collapse.  We then turned our attention to the distal interlocking screw.  Once again, we used the outrigger device to place a single interlocking screw in the midshaft area.  The outrigger device was removed and final fluoroscopic images were obtained.  The wounds were thoroughly irrigated closed in a multilayer fashion with 0 vicryl, 2-0 monocryl and 4-0 monocryl.  Sterile dressings were placed.  The patient was awoken from general anesthesia and taken to the PACU in stable condition without complication.     Post-operative plan:  Weightbearing: The patient will be WBAT on the operative extremity.   DVT prophylaxis per primary team, no orthopedic contraindications.  Recommend 81 mg Aspirin BID, unless patient cannot tolerate or was previously on anticoagulation.  Prefer to start Ppx  POD#1 Pain control with PRN pain medication preferring oral medicines.   Dressing can be reinforced as needed, will change on POD#2/3 if needed.  Patient does not need to remain hospitalized for dressing change Follow up plan: approximately 2 weeks postop for incision check and XR.  If the patient will be returning to a nursing facility, sutures can be trimmed around this time and a follow up appointment can be scheduled for 6 weeks after surgery. XR at next visit:  please obtain [facility] pelvis, and 2 views of the Left femur

## 2022-06-11 NOTE — Care Management Important Message (Signed)
Important Message  Patient Details  Name: Darius Smith MRN: [id number] Date of Birth: Aug 07, 1946   Medicare Important Message Given:  Yes     Tommy Medal 06/11/2022, 11:30 AM

## 2022-06-11 NOTE — Progress Notes (Signed)
PROGRESS NOTE    ANWAR SAKATA  MVE:[id number] DOB: 1946/04/21 DOA: 06/09/2022 PCP: [name], MD   Brief Narrative:    Darius Smith is a 76 y.o. male with medical history significant of non-Hodgkin's lymphoma in remission, T2DM, A-fib on Eliquis, hypertension, GERD who presents to the emergency department via EMS after sustaining a fall at home.  He was noted to have a comminuted left intertrochanteric fracture and is awaiting operative intervention in a.m.  Home Eliquis has been held.  Assessment & Plan:   Principal Problem:   Closed comminuted intertrochanteric fracture of left femur, initial encounter ([place]) Active Problems:   Essential hypertension   GERD (gastroesophageal reflux disease)   Atrial fibrillation (HCC)   Non Hodgkin's lymphoma (HCC)   Chronic combined systolic and diastolic CHF (congestive heart failure) (HCC)   Stage 3b chronic kidney disease (HCC)   Hypokalemia   Thrombocytopenia (HCC)   Normocytic anemia   Type 2 diabetes mellitus (HCC)   COPD (chronic obstructive pulmonary disease) (HCC)  Assessment and Plan:  Comminuted left intratrochanteric fracture Left hip x-ray showed comminuted left intratrochanteric fracture Continue fall precaution and neurochecks Continue Dilaudid as needed Surgery per orthopedics today, patient has been n.p.o. after midnight Consider PT/OT eval and treat status post surgical repair     Chronic thrombocytopenia Platelets are 114, continue to monitor platelet levels   Normocytic anemia Hemoglobin at 10.1 (this was 11.7 on September 27, 2021) Continue to monitor H/H recommended labs   CKD stage IIIb BUN/creatinine 19/1.79 (creatinine is within baseline range) Renally adjust medications, avoid nephrotoxic agents/dehydration/hypotension   A-fib on Eliquis CHA2DS2-VASc score of at least 4 Continue amiodarone Eliquis will be held at this time and aspirin for possible surgical repair of the left hip   T2DM Continue  ISS and hypoglycemic protocol   Essential hypertension Continue amlodipine, hydralazine   GERD Continue Protonix   Non-Hodgkin's lymphoma Continue lenalidomide   Chronic combined systolic and diastolic CHF Echocardiogram done on 11/26/2020 showed LVEF of 50%.  LV has mildly decreased function.  G1 DD Continue home Lasix   COPD (not in acute exacerbation) Continue Ventolin   Goals of care Appreciate palliative consultation and patient is now DNR     DVT prophylaxis: SCDs Code Status: DNR Family Communication: Wife at bedside 9/22 Disposition Plan:  Status is: Inpatient Remains inpatient appropriate because: Need for IV medications   Consultants:  Orthopedics   Procedures:  None   Antimicrobials:  None    Subjective: Patient seen and evaluated today with no new acute complaints or concerns. No acute concerns or events noted overnight.  Patient has been n.p.o. since midnight for surgery this AM.  Objective: Vitals:   06/10/22 1300 06/10/22 2140 06/11/22 0459 06/11/22 0847  BP: 110/66 124/78 111/64 114/64  Pulse: 60 65 (!) 58 60  Resp: 20 18 18    Temp: 98.4 F (36.9 C) 98.5 F (36.9 C) 97.6 F (36.4 C) 99 F (37.2 C)  TempSrc: Oral  Oral Oral  SpO2: 100% 99% 98% 100%  Weight:      Height:        Intake/Output Summary (Last 24 hours) at 06/11/2022 0922 Last data filed at 06/11/2022 0500 Gross per 24 hour  Intake 480 ml  Output 400 ml  Net 80 ml   Filed Weights   06/09/22 1723 06/09/22 2337  Weight: 79.4 kg 81.3 kg    Examination:  General exam: Appears calm and comfortable  Respiratory system: Clear to auscultation. Respiratory  effort normal.  4 L nasal cannula Cardiovascular system: S1 & S2 heard, RRR.  Gastrointestinal system: Abdomen is soft Central nervous system: Alert and awake Extremities: No edema Skin: No significant lesions noted Psychiatry: Flat affect.    Data Reviewed: I have personally reviewed following labs and imaging  studies  CBC: Recent Labs  Lab 06/09/22 1855 06/10/22 0913 06/11/22 0347  WBC 5.9 6.3 5.5  NEUTROABS 4.3  --   --   HGB 11.9* 10.6* 10.1*  HCT 34.4* 32.3* 30.4*  MCV 92.5 97.6 95.9  PLT 148* 121* 970*   Basic Metabolic Panel: Recent Labs  Lab 06/09/22 1855 06/10/22 0913 06/11/22 0347  NA 138 139 136  K 3.4* 4.6 3.8  CL 109 112* 104  CO2 19* 23 24  GLUCOSE 107* 113* 100*  BUN 19 21 18   CREATININE 1.79* 1.89* 1.64*  CALCIUM 8.4* 8.3* 8.2*  MG  --  1.9 1.9  PHOS  --  4.6  --    GFR: Estimated Creatinine Clearance: 42.1 mL/min (A) (by C-G formula based on SCr of 1.64 mg/dL (H)). Liver Function Tests: Recent Labs  Lab 06/10/22 0913  AST 13*  ALT 15  ALKPHOS 57  BILITOT 1.3*  PROT 5.7*  ALBUMIN 3.2*   No results for input(s): "LIPASE", "AMYLASE" in the last 168 hours. No results for input(s): "AMMONIA" in the last 168 hours. Coagulation Profile: No results for input(s): "INR", "PROTIME" in the last 168 hours. Cardiac Enzymes: No results for input(s): "CKTOTAL", "CKMB", "CKMBINDEX", "TROPONINI" in the last 168 hours. BNP (last 3 results) No results for input(s): "PROBNP" in the last 8760 hours. HbA1C: No results for input(s): "HGBA1C" in the last 72 hours. CBG: Recent Labs  Lab 06/10/22 0722 06/10/22 1109 06/10/22 1617 06/10/22 2135 06/11/22 0737  GLUCAP 107* 114* 107* 103* 106*   Lipid Profile: No results for input(s): "CHOL", "HDL", "LDLCALC", "TRIG", "CHOLHDL", "LDLDIRECT" in the last 72 hours. Thyroid Function Tests: No results for input(s): "TSH", "T4TOTAL", "FREET4", "T3FREE", "THYROIDAB" in the last 72 hours. Anemia Panel: No results for input(s): "VITAMINB12", "FOLATE", "FERRITIN", "TIBC", "IRON", "RETICCTPCT" in the last 72 hours. Sepsis Labs: No results for input(s): "PROCALCITON", "LATICACIDVEN" in the last 168 hours.  Recent Results (from the past 240 hour(s))  Surgical PCR screen     Status: None   Collection Time: 06/10/22  8:31 PM    Specimen: Nasal Mucosa; Nasal Swab  Result Value Ref Range Status   MRSA, PCR NEGATIVE NEGATIVE Final   Staphylococcus aureus NEGATIVE NEGATIVE Final    Comment: (NOTE) The Xpert SA Assay (FDA approved for NASAL specimens in patients 3 years of age and older), is one component of a comprehensive surveillance program. It is not intended to diagnose infection nor to guide or monitor treatment. Performed at [facility], [address] [place], [place] [address]          Radiology Studies: DG Hip Malvin Johns or Wo Pelvis 2-3 Views Left  Result Date: 06/09/2022 CLINICAL DATA:  Recent fall with left hip pain, initial encounter EXAM: DG HIP (WITH OR WITHOUT PELVIS) 3V LEFT COMPARISON:  None Available. FINDINGS: Comminuted left intratrochanteric fracture is noted with mild impaction at the fracture site. No dislocation is noted. The pelvic ring appears intact. No other focal abnormality is noted. IMPRESSION: Comminuted left intratrochanteric fracture. Electronically Signed   By: Inez Catalina M.D.   On: 06/09/2022 18:59        Scheduled Meds:  amiodarone  200 mg Oral Daily  amLODipine  10 mg Oral Daily   furosemide  20 mg Oral QODAY   hydrALAZINE  100 mg Oral BID   insulin aspart  0-9 Units Subcutaneous TID WC   lenalidomide  10 mg Oral QHS   pantoprazole  40 mg Oral Daily   senna-docusate  2 tablet Oral BID   Continuous Infusions:   ceFAZolin (ANCEF) IV     tranexamic acid       LOS: 2 days    Time spent: 35 minutes    Dugan Vanhoesen Darleen Crocker, DO [facility]  If 7PM-7AM, please contact night-coverage [email or web address] 06/11/2022, 9:22 AM

## 2022-06-11 NOTE — Transfer of Care (Signed)
Immediate Anesthesia Transfer of Care Note  Patient: Darius Smith  Procedure(s) Performed: INTRAMEDULLARY (IM) NAIL INTERTROCHANTERIC (Left: Hip)  Patient Location: PACU  Anesthesia Type:General  Level of Consciousness: awake, alert , oriented and patient cooperative  Airway & Oxygen Therapy: Patient Spontanous Breathing and Patient connected to nasal cannula oxygen  Post-op Assessment: Report given to RN, Post -op Vital signs reviewed and stable and Patient moving all extremities  Post vital signs: Reviewed and stable  Last Vitals:  Vitals Value Taken Time  BP 124/70 06/11/22 1418  Temp    Pulse 74 06/11/22 1427  Resp 15 06/11/22 1427  SpO2 94 % 06/11/22 1427  Vitals shown include unvalidated device data.  Last Pain:  Vitals:   06/11/22 1154  TempSrc: Oral  PainSc: 0-No pain      Patients Stated Pain Goal: 0 (04/59/97 7414)  Complications: No notable events documented.

## 2022-06-11 NOTE — Plan of Care (Signed)
  Problem: Education: Goal: Knowledge of General Education information will improve Description: Including pain rating scale, medication(s)/side effects and non-pharmacologic comfort measures Outcome: Progressing   Problem: Health Behavior/Discharge Planning: Goal: Ability to manage health-related needs will improve Outcome: Progressing   Problem: Clinical Measurements: Goal: Ability to maintain clinical measurements within normal limits will improve Outcome: Progressing Goal: Will remain free from infection Outcome: Progressing Goal: Diagnostic test results will improve Outcome: Progressing Goal: Cardiovascular complication will be avoided Outcome: Progressing   Problem: Activity: Goal: Risk for activity intolerance will decrease Outcome: Progressing   Problem: Nutrition: Goal: Adequate nutrition will be maintained Outcome: Progressing   Problem: Coping: Goal: Level of anxiety will decrease Outcome: Progressing   Problem: Elimination: Goal: Will not experience complications related to bowel motility Outcome: Progressing Goal: Will not experience complications related to urinary retention Outcome: Progressing   Problem: Pain Managment: Goal: General experience of comfort will improve Outcome: Progressing   Problem: Safety: Goal: Ability to remain free from injury will improve Outcome: Progressing   Problem: Skin Integrity: Goal: Risk for impaired skin integrity will decrease Outcome: Progressing   Problem: Education: Goal: Ability to describe self-care measures that may prevent or decrease complications (Diabetes Survival Skills Education) will improve Outcome: Progressing Goal: Individualized Educational Video(s) Outcome: Progressing   Problem: Coping: Goal: Ability to adjust to condition or change in health will improve Outcome: Progressing   Problem: Fluid Volume: Goal: Ability to maintain a balanced intake and output will improve Outcome: Progressing    Problem: Health Behavior/Discharge Planning: Goal: Ability to identify and utilize available resources and services will improve Outcome: Progressing Goal: Ability to manage health-related needs will improve Outcome: Progressing   Problem: Skin Integrity: Goal: Risk for impaired skin integrity will decrease Outcome: Progressing   Problem: Tissue Perfusion: Goal: Adequacy of tissue perfusion will improve Outcome: Progressing   Problem: Education: Goal: Ability to describe self-care measures that may prevent or decrease complications (Diabetes Survival Skills Education) will improve Outcome: Progressing   Problem: Skin Integrity: Goal: Risk for impaired skin integrity will decrease Outcome: Progressing

## 2022-06-11 NOTE — Anesthesia Procedure Notes (Signed)
Procedure Name: LMA Insertion Date/Time: 06/11/2022 12:49 PM  Performed by: Myna Bright, CRNAPre-anesthesia Checklist: Patient identified, Emergency Drugs available, Suction available and Patient being monitored Patient Re-evaluated:Patient Re-evaluated prior to induction Oxygen Delivery Method: Circle system utilized Preoxygenation: Pre-oxygenation with 100% oxygen Induction Type: IV induction Ventilation: Mask ventilation without difficulty LMA: LMA inserted LMA Size: 4.0 Number of attempts: 1 Placement Confirmation: positive ETCO2 and breath sounds checked- equal and bilateral Tube secured with: Tape Dental Injury: Teeth and Oropharynx as per pre-operative assessment

## 2022-06-11 NOTE — Anesthesia Preprocedure Evaluation (Signed)
Anesthesia Evaluation  Patient identified by MRN, date of birth, ID band Patient awake    Reviewed: Allergy & Precautions, H&P , NPO status , Patient's Chart, lab work & pertinent test results, reviewed documented beta blocker date and time   Airway Mallampati: II  TM Distance: >3 FB Neck ROM: full    Dental no notable dental hx.    Pulmonary shortness of breath, COPD,    Pulmonary exam normal breath sounds clear to auscultation       Cardiovascular Exercise Tolerance: Good hypertension, + dysrhythmias Atrial Fibrillation  Rhythm:regular Rate:Normal     Neuro/Psych negative neurological ROS  negative psych ROS   GI/Hepatic Neg liver ROS, GERD  Medicated,  Endo/Other  negative endocrine ROSdiabetes, Type 2  Renal/GU Renal disease  negative genitourinary   Musculoskeletal   Abdominal   Peds  Hematology  (+) Blood dyscrasia, anemia ,   Anesthesia Other Findings   Reproductive/Obstetrics negative OB ROS                             Anesthesia Physical Anesthesia Plan  ASA: 3 and emergent  Anesthesia Plan: General and General LMA   Post-op Pain Management:    Induction:   PONV Risk Score and Plan: Ondansetron  Airway Management Planned:   Additional Equipment:   Intra-op Plan:   Post-operative Plan:   Informed Consent: I have reviewed the patients History and Physical, chart, labs and discussed the procedure including the risks, benefits and alternatives for the proposed anesthesia with the patient or authorized representative who has indicated his/her understanding and acceptance.     Dental Advisory Given  Plan Discussed with: CRNA  Anesthesia Plan Comments:         Anesthesia Quick Evaluation

## 2022-06-11 NOTE — Progress Notes (Signed)
ORTHOPAEDIC PROGRESS NOTE  Scheduled for Procedure(s): Cephalomedullary nail for left intertrochanteric femur fracture   DOS: 06/11/2022  SUBJECTIVE: No issues over night.  He has pain in his left leg when he tries to move the leg.  Nothing to eat or drink since midnight  OBJECTIVE: PE:  Alert and oriented, no acute distress  Swelling of left thigh No bruising Hip is held in external rotation Toes are warm and well perfused Sensation intact to dorsum of the left foot Active motion in TA/EHL  Vitals:   06/10/22 2140 06/11/22 0459  BP: 124/78 111/64  Pulse: 65 (!) 58  Resp: 18 18  Temp: 98.5 F (36.9 C) 97.6 F (36.4 C)  SpO2: 99% 98%      Latest Ref Rng & Units 06/11/2022    3:47 AM 06/10/2022    9:13 AM 06/09/2022    6:55 PM  CBC  WBC 4.0 - 10.5 K/uL 5.5  6.3  5.9   Hemoglobin 13.0 - 17.0 g/dL 10.1  10.6  11.9   Hematocrit 39.0 - 52.0 % 30.4  32.3  34.4   Platelets 150 - 400 K/uL 114  121  148      ASSESSMENT: Darius Smith is a 76 y.o. male stable, ready for surgery.  NPO since midnight.  PLAN: Weightbearing: NWB LLE Insicional and dressing care: Reinforce dressings as needed; none currently Orthopedic device(s): None VTE prophylaxis: None currently, please hold until POD#1 Pain control: PRN medications, judicious use of narcotics Follow - up plan: 2 weeks postop   Contact information:     Aunica Dauphinee A. Amedeo Kinsman, MD [place] [address] [place],  [place]  [address] Phone: [phone] Fax: [phone]

## 2022-06-12 DIAGNOSIS — S72142A Displaced intertrochanteric fracture of left femur, initial encounter for closed fracture: Secondary | ICD-10-CM | POA: Diagnosis not present

## 2022-06-12 LAB — GLUCOSE, CAPILLARY
Glucose-Capillary: 131 mg/dL — ABNORMAL HIGH (ref 70–99)
Glucose-Capillary: 144 mg/dL — ABNORMAL HIGH (ref 70–99)
Glucose-Capillary: 115 mg/dL — ABNORMAL HIGH (ref 70–99)
Glucose-Capillary: 105 mg/dL — ABNORMAL HIGH (ref 70–99)

## 2022-06-12 LAB — BASIC METABOLIC PANEL
Anion gap: 7 (ref 5–15)
BUN: 25 mg/dL — ABNORMAL HIGH (ref 8–23)
CO2: 23 mmol/L (ref 22–32)
Calcium: 8.3 mg/dL — ABNORMAL LOW (ref 8.9–10.3)
Chloride: 106 mmol/L (ref 98–111)
Creatinine, Ser: 1.76 mg/dL — ABNORMAL HIGH (ref 0.61–1.24)
GFR, Estimated: 40 mL/min — ABNORMAL LOW (ref >60)
Glucose, Bld: 148 mg/dL — ABNORMAL HIGH (ref 70–99)
Potassium: 4.1 mmol/L (ref 3.5–5.1)
Sodium: 136 mmol/L (ref 135–145)

## 2022-06-12 LAB — MAGNESIUM: Magnesium: 2 mg/dL (ref 1.7–2.4)

## 2022-06-12 LAB — CBC
HCT: 26.6 % — ABNORMAL LOW (ref 39.0–52.0)
Hemoglobin: 8.8 g/dL — ABNORMAL LOW (ref 13.0–17.0)
MCH: 31.5 pg (ref 26.0–34.0)
MCHC: 33.1 g/dL (ref 30.0–36.0)
MCV: 95.3 fL (ref 80.0–100.0)
Platelets: 97 10*3/uL — ABNORMAL LOW (ref 150–400)
RBC: 2.79 MIL/uL — ABNORMAL LOW (ref 4.22–5.81)
RDW: 14.8 % (ref 11.5–15.5)
WBC: 8.1 10*3/uL (ref 4.0–10.5)
nRBC: 0 % (ref 0.0–0.2)

## 2022-06-12 MED ORDER — APIXABAN 5 MG PO TABS
5.0000 mg | ORAL_TABLET | Freq: Two times a day (BID) | ORAL | Status: DC
  Administered 2022-06-12 – 2022-06-14 (×4): 5 mg via ORAL
  Filled 2022-06-12 (×4): qty 1

## 2022-06-12 NOTE — Evaluation (Signed)
Physical Therapy Evaluation Patient Details Name: Darius Smith MRN: [id number] DOB: 23-Nov-1945 Today's Date: 06/12/2022  History of Present Illness  Darius Smith is a 76 y.o. male with medical history significant of non-Hodgkin's lymphoma in remission, T2DM, A-fib on Eliquis, hypertension, GERD who presents to the emergency department via EMS after sustaining a fall at home.  Patient states that he misstepped while going into his garage and fell landing on his left side with difficulty in being able to get up due to left hip pain.  EMS was activated and patient was taken to the ED for further evaluation and management.  He denies chest pain, shortness of breath, fever, chills, nausea, vomiting, abdominal pain.   Clinical Impression   Patient presents supine in bed with wife and visitors present. He is awake, alert and cooperative. Patient reports no pain at rest but 10/10 pain with attempt to perform supine to sit transfer with mod/ max A for LLE placement. Patient able to tolerate leg to edge of bed, but does not tolerate bending it to obtain upright sitting position at EOB. Patient declines further attempts to progress transfer ability at this time. Assisted patient with return to supine lying in center of bed. Educated patient on ,and performed, supine LE strengthening and mobility exercises as noted in flow sheet, for decreased pain and improved LLE mobility. Patient and wife verbalized understanding. Patient left in upright (HOB elevated) position in bed, LLE elevated on pillow, phone and call bell in reach and wife present at bedside. Patient will benefit from continued physical therapy in hospital and recommended venue below to increase strength, balance, endurance for safe ADLs and gait.         Recommendations for follow up therapy are one component of a multi-disciplinary discharge planning process, led by the attending physician.  Recommendations may be updated based on patient status,  additional functional criteria and insurance authorization.  Follow Up Recommendations Skilled nursing-short term rehab (<3 hours/day) Can patient physically be transported by private vehicle: No    Assistance Recommended at Discharge Frequent or constant Supervision/Assistance  Patient can return home with the following  A lot of help with bathing/dressing/bathroom;A lot of help with walking and/or transfers    Equipment Recommendations None recommended by PT  Recommendations for Other Services       Functional Status Assessment Patient has had a recent decline in their functional status and demonstrates the ability to make significant improvements in function in a reasonable and predictable amount of time.     Precautions / Restrictions Restrictions Weight Bearing Restrictions: Yes LLE Weight Bearing: Weight bearing as tolerated      Mobility  Bed Mobility Overal bed mobility: Needs Assistance Bed Mobility: Sit to Supine, Supine to Sit     Supine to sit: Mod assist Sit to supine: Mod assist   General bed mobility comments: Assist with LLE placement    Transfers                        Ambulation/Gait                  Stairs            Wheelchair Mobility    Modified Rankin (Stroke Patients Only)       Balance Overall balance assessment: Needs assistance   Sitting balance-Leahy Scale: Poor Sitting balance - Comments: Unable to sit at EOB due to increased LT hip pain  Pertinent Vitals/Pain Pain Assessment Pain Assessment: No/denies pain    Home Living Family/patient expects to be discharged to:: Private residence Living Arrangements: Spouse/significant other Available Help at Discharge: Family;Available 24 hours/day Type of Home: House Home Access: Ramped entrance       Home Layout: One level Home Equipment: BSC/3in1;Cane - single point;Transport chair;Shower Electrical engineer (2 wheels)      Prior Function Prior Level of Function : Needs assist       Physical Assist : ADLs (physical)       ADLs Comments: Wife assists with mobility and ADLs PRN     Hand Dominance        Extremity/Trunk Assessment   Upper Extremity Assessment Upper Extremity Assessment: Overall WFL for tasks assessed    Lower Extremity Assessment Lower Extremity Assessment: LLE deficits/detail LLE Deficits / Details: Increased pain with movement, decreased quad activation, decreased hip and knee mobility LLE: Unable to fully assess due to pain    Cervical / Trunk Assessment Cervical / Trunk Assessment: Normal  Communication   Communication: No difficulties  Cognition Arousal/Alertness: Awake/alert Behavior During Therapy: WFL for tasks assessed/performed Overall Cognitive Status: Within Functional Limits for tasks assessed                                          General Comments      Exercises General Exercises - Lower Extremity Ankle Circles/Pumps: Both, 10 reps Quad Sets: Left, 5 reps Heel Slides: Left, 5 reps (partial reps with assist to tolerance)   Assessment/Plan    PT Assessment Patient needs continued PT services  PT Problem List Decreased strength;Decreased mobility;Decreased range of motion;Decreased activity tolerance;Decreased balance;Pain       PT Treatment Interventions DME instruction;Gait training;Stair training;Balance training;Therapeutic exercise;Manual techniques;Therapeutic activities;Patient/family education;Functional mobility training;Neuromuscular re-education    PT Goals (Current goals can be found in the Care Plan section)  Acute Rehab PT Goals Patient Stated Goal: Return home PT Goal Formulation: With patient/family Time For Goal Achievement: 06/26/22 Potential to Achieve Goals: Good    Frequency Min 5X/week     Co-evaluation               AM-PAC PT "6 Clicks" Mobility  Outcome Measure Help  needed turning from your back to your side while in a flat bed without using bedrails?: A Lot Help needed moving from lying on your back to sitting on the side of a flat bed without using bedrails?: A Lot Help needed moving to and from a bed to a chair (including a wheelchair)?: Total Help needed standing up from a chair using your arms (e.g., wheelchair or bedside chair)?: Total Help needed to walk in hospital room?: Total Help needed climbing 3-5 steps with a railing? : Total 6 Click Score: 8    End of Session   Activity Tolerance: Patient limited by pain Patient left: in bed;with call bell/phone within reach;with family/visitor present Nurse Communication: Mobility status PT Visit Diagnosis: Other abnormalities of gait and mobility (R26.89);Unsteadiness on feet (R26.81);Difficulty in walking, not elsewhere classified (R26.2);Muscle weakness (generalized) (M62.81);Pain Pain - Right/Left: Left Pain - part of body: Leg    Time: 6283-1517 PT Time Calculation (min) (ACUTE ONLY): 24 min   Charges:   PT Evaluation $PT Eval Low Complexity: 1 Low PT Treatments $Therapeutic Exercise: 8-22 mins      2:25 PM, 06/12/22 Josue Hector PT DPT  Physical  Therapist with [facility]  [phone]

## 2022-06-12 NOTE — Progress Notes (Signed)
PROGRESS NOTE    Darius Smith  HTD:[id number] DOB: 08-Apr-1946 DOA: 06/09/2022 PCP: [name], MD   Brief Narrative:    [name] is a 76 y.o. male with medical history significant of non-Hodgkin's lymphoma in remission, T2DM, A-fib on Eliquis, hypertension, GERD who presents to the emergency department via EMS after sustaining a fall at home.  He was noted to have a comminuted left intertrochanteric fracture and is status post IM nail to left hip on 9/22.  Home Eliquis resumed 9/23 and PT/OT evaluation pending.  Assessment & Plan:   Principal Problem:   Closed comminuted intertrochanteric fracture of left femur, initial encounter ([place]) Active Problems:   Essential hypertension   GERD (gastroesophageal reflux disease)   Atrial fibrillation (HCC)   Non Hodgkin's lymphoma (HCC)   Chronic combined systolic and diastolic CHF (congestive heart failure) (HCC)   Stage 3b chronic kidney disease (HCC)   Hypokalemia   Thrombocytopenia (HCC)   Normocytic anemia   Type 2 diabetes mellitus (HCC)   COPD (chronic obstructive pulmonary disease) (HCC)  Assessment and Plan:   Comminuted left intratrochanteric fracture status post IM nail 9/22 Left hip x-ray showed comminuted left intratrochanteric fracture Continue fall precaution and neurochecks Continue Dilaudid as needed IM nail 9/22 PT/OT evaluation pending     Chronic thrombocytopenia Platelets are 97, continue to monitor platelet levels   Normocytic anemia Hemoglobin at 8, but with no overt bleeding (this was 11.7 on September 27, 2021) Continue to monitor H/H on labs especially with Eliquis resumed   CKD stage IIIb BUN/creatinine 19/1.79 (creatinine is within baseline range) Renally adjust medications, avoid nephrotoxic agents/dehydration/hypotension   A-fib on Eliquis CHA2DS2-VASc score of at least 4 Continue amiodarone Eliquis resumed 9/23   T2DM Continue ISS and hypoglycemic protocol   Essential  hypertension Continue amlodipine, hydralazine   GERD Continue Protonix   Non-Hodgkin's lymphoma Continue lenalidomide   Chronic combined systolic and diastolic CHF Echocardiogram done on 11/26/2020 showed LVEF of 50%.  LV has mildly decreased function.  G1 DD Continue home Lasix   COPD (not in acute exacerbation) Continue Ventolin   Goals of care Appreciate palliative consultation and patient is now DNR     DVT prophylaxis: Eliquis Code Status: DNR Family Communication: Wife at bedside 9/22 Disposition Plan:  Status is: Inpatient Remains inpatient appropriate because: Need for IV medications   Consultants:  Orthopedics   Procedures:  None   Antimicrobials:  None   Subjective: Patient seen and evaluated today with no new acute complaints or concerns. No acute concerns or events noted overnight.  He is wanting to have his Foley catheter removed.  Objective: Vitals:   06/11/22 1509 06/11/22 2046 06/12/22 0024 06/12/22 0415  BP: 126/73 (!) 113/55 (!) 99/56 104/70  Pulse: 78 64 73 65  Resp: 15 19 17 16   Temp: 98.1 F (36.7 C) 99.2 F (37.3 C) 98.5 F (36.9 C) 98.4 F (36.9 C)  TempSrc: Oral Oral Oral Oral  SpO2: 100% 99% 95% 96%  Weight:      Height:        Intake/Output Summary (Last 24 hours) at 06/12/2022 1051 Last data filed at 06/12/2022 0609 Gross per 24 hour  Intake 990 ml  Output 1100 ml  Net -110 ml   Filed Weights   06/09/22 1723 06/09/22 2337  Weight: 79.4 kg 81.3 kg    Examination:  General exam: Appears calm and comfortable  Respiratory system: Clear to auscultation. Respiratory effort normal. Cardiovascular system: S1 &  S2 heard, RRR.  Gastrointestinal system: Abdomen is soft Central nervous system: Alert and awake Extremities: No edema Skin: No significant lesions noted Psychiatry: Flat affect. Foley with clear, yellow urine output    Data Reviewed: I have personally reviewed following labs and imaging studies  CBC: Recent  Labs  Lab 06/09/22 1855 06/10/22 0913 06/11/22 0347 06/12/22 0624  WBC 5.9 6.3 5.5 8.1  NEUTROABS 4.3  --   --   --   HGB 11.9* 10.6* 10.1* 8.8*  HCT 34.4* 32.3* 30.4* 26.6*  MCV 92.5 97.6 95.9 95.3  PLT 148* 121* 114* 97*   Basic Metabolic Panel: Recent Labs  Lab 06/09/22 1855 06/10/22 0913 06/11/22 0347 06/12/22 0624  NA 138 139 136 136  K 3.4* 4.6 3.8 4.1  CL 109 112* 104 106  CO2 19* 23 24 23   GLUCOSE 107* 113* 100* 148*  BUN 19 21 18  25*  CREATININE 1.79* 1.89* 1.64* 1.76*  CALCIUM 8.4* 8.3* 8.2* 8.3*  MG  --  1.9 1.9 2.0  PHOS  --  4.6  --   --    GFR: Estimated Creatinine Clearance: 39.2 mL/min (A) (by C-G formula based on SCr of 1.76 mg/dL (H)). Liver Function Tests: Recent Labs  Lab 06/10/22 0913  AST 13*  ALT 15  ALKPHOS 57  BILITOT 1.3*  PROT 5.7*  ALBUMIN 3.2*   No results for input(s): "LIPASE", "AMYLASE" in the last 168 hours. No results for input(s): "AMMONIA" in the last 168 hours. Coagulation Profile: No results for input(s): "INR", "PROTIME" in the last 168 hours. Cardiac Enzymes: No results for input(s): "CKTOTAL", "CKMB", "CKMBINDEX", "TROPONINI" in the last 168 hours. BNP (last 3 results) No results for input(s): "PROBNP" in the last 8760 hours. HbA1C: No results for input(s): "HGBA1C" in the last 72 hours. CBG: Recent Labs  Lab 06/11/22 1117 06/11/22 1428 06/11/22 1709 06/11/22 2041 06/12/22 0733  GLUCAP 96 118* 142* 194* 131*   Lipid Profile: No results for input(s): "CHOL", "HDL", "LDLCALC", "TRIG", "CHOLHDL", "LDLDIRECT" in the last 72 hours. Thyroid Function Tests: No results for input(s): "TSH", "T4TOTAL", "FREET4", "T3FREE", "THYROIDAB" in the last 72 hours. Anemia Panel: No results for input(s): "VITAMINB12", "FOLATE", "FERRITIN", "TIBC", "IRON", "RETICCTPCT" in the last 72 hours. Sepsis Labs: No results for input(s): "PROCALCITON", "LATICACIDVEN" in the last 168 hours.  Recent Results (from the past 240 hour(s))   Surgical PCR screen     Status: None   Collection Time: 06/10/22  8:31 PM   Specimen: Nasal Mucosa; Nasal Swab  Result Value Ref Range Status   MRSA, PCR NEGATIVE NEGATIVE Final   Staphylococcus aureus NEGATIVE NEGATIVE Final    Comment: (NOTE) The Xpert SA Assay (FDA approved for NASAL specimens in patients 82 years of age and older), is one component of a comprehensive surveillance program. It is not intended to diagnose infection nor to guide or monitor treatment. Performed at [facility], [address], [place], [place] [address]          Radiology Studies: DG HIP UNILAT WITH PELVIS 2-3 VIEWS LEFT  Result Date: 06/11/2022 CLINICAL DATA:  ORIF left femur EXAM: DG HIP (WITH OR WITHOUT PELVIS) 2-3V LEFT COMPARISON:  06/09/2022 FINDINGS: Intraoperative images during left proximal femur ORIF for an intertrochanteric fracture. Improved fracture alignment. Hardware is intact. No evidence of immediate complication. IMPRESSION: Intraoperative images during left proximal femur ORIF for an intertrochanteric fracture. Improved fracture alignment. No evidence of immediate complication. Electronically Signed   By: Ileene Patrick.D.  On: 06/11/2022 15:09   DG Pelvis Portable  Result Date: 06/11/2022 CLINICAL DATA:  Postop EXAM: LEFT FEMUR 2 VIEWS; PORTABLE PELVIS 1-2 VIEWS COMPARISON:  None Available. FINDINGS: Postoperative changes of cephalomedullary nailing for an intertrochanteric left femur fracture. Improved fracture alignment. Expected soft tissue changes. Intact hardware without evidence of immediate complication. IMPRESSION: Postoperative changes of left proximal femur ORIF. Improved fracture alignment. No evidence of immediate complication. Electronically Signed   By: Maurine Simmering Darius.D.   On: 06/11/2022 15:08   DG FEMUR MIN 2 VIEWS LEFT  Result Date: 06/11/2022 CLINICAL DATA:  Postop EXAM: LEFT FEMUR 2 VIEWS; PORTABLE PELVIS 1-2 VIEWS COMPARISON:  None Available. FINDINGS: Postoperative  changes of cephalomedullary nailing for an intertrochanteric left femur fracture. Improved fracture alignment. Expected soft tissue changes. Intact hardware without evidence of immediate complication. IMPRESSION: Postoperative changes of left proximal femur ORIF. Improved fracture alignment. No evidence of immediate complication. Electronically Signed   By: Maurine Simmering Darius.D.   On: 06/11/2022 15:08   DG C-Arm 1-60 Min-No Report  Result Date: 06/11/2022 Fluoroscopy was utilized by the requesting physician.  No radiographic interpretation.        Scheduled Meds:  amiodarone  200 mg Oral Daily   amLODipine  10 mg Oral Daily   apixaban  5 mg Oral BID   furosemide  20 mg Oral QODAY   hydrALAZINE  100 mg Oral BID   insulin aspart  0-9 Units Subcutaneous TID WC   lenalidomide  10 mg Oral QHS   pantoprazole  40 mg Oral Daily   senna-docusate  2 tablet Oral BID     LOS: 3 days    Time spent: 35 minutes    Inocente Krach Darleen Crocker, DO [facility]  If 7PM-7AM, please contact night-coverage [email or web address] 06/12/2022, 10:51 AM

## 2022-06-12 NOTE — Plan of Care (Signed)
  Problem: Acute Rehab PT Goals(only PT should resolve) Goal: Pt Will Go Supine/Side To Sit Flowsheets (Taken 06/12/2022 1427) Pt will go Supine/Side to Sit: with modified independence Goal: Patient Will Transfer Sit To/From Stand Flowsheets (Taken 06/12/2022 1427) Patient will transfer sit to/from stand: with min guard assist Goal: Pt Will Transfer Bed To Chair/Chair To Bed Flowsheets (Taken 06/12/2022 1427) Pt will Transfer Bed to Chair/Chair to Bed: min guard assist Goal: Pt Will Ambulate Flowsheets (Taken 06/12/2022 1427) Pt will Ambulate:  50 feet  with min guard assist  with rolling walker Goal: Pt/caregiver will Perform Home Exercise Program Flowsheets (Taken 06/12/2022 1427) Pt/caregiver will Perform Home Exercise Program:  For increased ROM  For increased strengthening  For improved balance  With Supervision, verbal cues required/provided  2:28 PM, 06/12/22 Josue Hector PT DPT  Physical Therapist with [facility]  [phone]

## 2022-06-12 NOTE — Progress Notes (Signed)
Removed foley catheter per MD orders. No issues. Urinated 15cc at 1200.

## 2022-06-12 NOTE — Progress Notes (Signed)
ORTHOPAEDIC PROGRESS NOTE  S/P:   Cephalomedullary nail for left intertrochanteric femur fracture   DOS: 06/11/2022  SUBJECTIVE: No issues overnight.  Pain has improved.  He has not worked with physical therapy yet.  OBJECTIVE: PE:  Alert and oriented, no acute distress Comfortable, lying in bed this morning  Mild diffuse swelling about the hip. No bruising Lateral hip incisions are clean, dry and intact Toes are warm and well perfused Sensation intact to dorsum of the left foot Active motion in TA/EHL  Vitals:   06/12/22 0024 06/12/22 0415  BP: (!) 99/56 104/70  Pulse: 73 65  Resp: 17 16  Temp: 98.5 F (36.9 C) 98.4 F (36.9 C)  SpO2: 95% 96%      Latest Ref Rng & Units 06/12/2022    6:24 AM 06/11/2022    3:47 AM 06/10/2022    9:13 AM  CBC  WBC 4.0 - 10.5 K/uL 8.1  5.5  6.3   Hemoglobin 13.0 - 17.0 g/dL 8.8  10.1  10.6   Hematocrit 39.0 - 52.0 % 26.6  30.4  32.3   Platelets 150 - 400 K/uL 97  114  121      ASSESSMENT: Darius Smith is a 76 y.o. male stable POD#1.  Waiting to work with physical therapy.   PLAN: Weightbearing: WBAT LLE Insicional and dressing care: Reinforce dressings as needed Orthopedic device(s): None VTE prophylaxis: resume Eliquis starting today Pain control: PRN medications, judicious use of narcotics Follow - up plan: 2 weeks postop.  If he is discharged to a nursing facility, sutures can be trimmed at 2 weeks, and I will see him at 6 weeks following surgery.  If he has any issues in a time, I am happy to see him in clinic.   Contact information:     Jossie Smoot A. Amedeo Kinsman, MD [place] [address] [place],  [place]  [address] Phone: [phone] Fax: [phone]

## 2022-06-12 NOTE — Anesthesia Postprocedure Evaluation (Signed)
Anesthesia Post Note  Patient: Darius Smith  Procedure(s) Performed: INTRAMEDULLARY (IM) NAIL INTERTROCHANTERIC (Left: Hip)  Patient location during evaluation: Phase II Anesthesia Type: General Level of consciousness: awake Pain management: pain level controlled Vital Signs Assessment: post-procedure vital signs reviewed and stable Respiratory status: spontaneous breathing and respiratory function stable Cardiovascular status: blood pressure returned to baseline and stable Postop Assessment: no headache and no apparent nausea or vomiting Anesthetic complications: no Comments: Late entry   No notable events documented.   Last Vitals:  Vitals:   06/12/22 0024 06/12/22 0415  BP: (!) 99/56 104/70  Pulse: 73 65  Resp: 17 16  Temp: 36.9 C 36.9 C  SpO2: 95% 96%    Last Pain:  Vitals:   06/12/22 0819  TempSrc:   PainSc: 0-No pain                 Louann Sjogren

## 2022-06-13 DIAGNOSIS — S72142A Displaced intertrochanteric fracture of left femur, initial encounter for closed fracture: Secondary | ICD-10-CM | POA: Diagnosis not present

## 2022-06-13 LAB — GLUCOSE, CAPILLARY
Glucose-Capillary: 108 mg/dL — ABNORMAL HIGH (ref 70–99)
Glucose-Capillary: 120 mg/dL — ABNORMAL HIGH (ref 70–99)
Glucose-Capillary: 107 mg/dL — ABNORMAL HIGH (ref 70–99)
Glucose-Capillary: 126 mg/dL — ABNORMAL HIGH (ref 70–99)

## 2022-06-13 LAB — MAGNESIUM: Magnesium: 1.9 mg/dL (ref 1.7–2.4)

## 2022-06-13 LAB — CBC
HCT: 24.7 % — ABNORMAL LOW (ref 39.0–52.0)
Hemoglobin: 8.3 g/dL — ABNORMAL LOW (ref 13.0–17.0)
MCH: 32 pg (ref 26.0–34.0)
MCHC: 33.6 g/dL (ref 30.0–36.0)
MCV: 95.4 fL (ref 80.0–100.0)
Platelets: 98 10*3/uL — ABNORMAL LOW (ref 150–400)
RBC: 2.59 MIL/uL — ABNORMAL LOW (ref 4.22–5.81)
RDW: 15.1 % (ref 11.5–15.5)
WBC: 8.7 10*3/uL (ref 4.0–10.5)
nRBC: 0 % (ref 0.0–0.2)

## 2022-06-13 LAB — BASIC METABOLIC PANEL
Anion gap: 9 (ref 5–15)
BUN: 28 mg/dL — ABNORMAL HIGH (ref 8–23)
CO2: 24 mmol/L (ref 22–32)
Calcium: 8.2 mg/dL — ABNORMAL LOW (ref 8.9–10.3)
Chloride: 102 mmol/L (ref 98–111)
Creatinine, Ser: 1.58 mg/dL — ABNORMAL HIGH (ref 0.61–1.24)
GFR, Estimated: 45 mL/min — ABNORMAL LOW (ref >60)
Glucose, Bld: 107 mg/dL — ABNORMAL HIGH (ref 70–99)
Potassium: 3.9 mmol/L (ref 3.5–5.1)
Sodium: 135 mmol/L (ref 135–145)

## 2022-06-13 NOTE — Progress Notes (Signed)
TOC CSW spoke with pt and pts wife, Darius Smith [phone].  They are in agreeance to pt going to SNF.  They have also rendered permission to send out bed requests and made aware of [email or web address].  Darius Smith, MSW, LCSW-A Pronouns:  She/Her/Hers [facility]itions of Care Clinical Social Worker Direct Number:  [phone] [email or web address]

## 2022-06-13 NOTE — [place] FL2 (Signed)
[place] MEDICAID FL2 LEVEL OF CARE SCREENING TOOL     IDENTIFICATION  Patient Name: Darius Smith Birthdate: Feb 22, 1946 Sex: male Admission Date (Current Location): 06/09/2022  [facility] and [place] Number:  [employer] and Address:  [place] [address] [place]      Provider Number: [id number]  Attending Physician Name and Address:  Rodena Goldmann, DO  Relative Name and Phone Number:  Johntay Doolen [phone]    Current Level of Care: Hospital Recommended Level of Care: [place] Prior Approval Number:    Date Approved/Denied:   PASRR Number: [id number]  Discharge Plan: SNF    Current Diagnoses: Patient Active Problem List   Diagnosis Date Noted   Hypokalemia 06/10/2022   Thrombocytopenia ([place]) 06/10/2022   Normocytic anemia 06/10/2022   Type 2 diabetes mellitus ([place]) 06/10/2022   COPD (chronic obstructive pulmonary disease) ([place]) 06/10/2022   Closed comminuted intertrochanteric fracture of left femur, initial encounter ([place]) 06/09/2022   Mixed conductive and sensorineural hearing loss of both ears 03/17/2022   Encounter for general adult medical examination with abnormal findings 03/17/2022   Prostate cancer screening 03/17/2022   Steroid-induced hyperglycemia 03/17/2022   CHF (congestive heart failure) ([place]) 06/24/2021   Brain metastases    Hyperkalemia 06/01/2021   Secondary hypercoagulable state ([place]) 08/27/2020   DNR (do not resuscitate) discussion    Stage 3b chronic kidney disease ([place]) 07/31/2020   Chronic combined systolic and diastolic CHF (congestive heart failure) ([place]) 07/14/2020   Non Hodgkin's lymphoma ([place]) 06/11/2020   Atrial fibrillation ([place]) 03/20/2020   Essential hypertension 08/08/2014   GERD (gastroesophageal reflux disease) 08/08/2014    Orientation RESPIRATION BLADDER Height & Weight     Self, Time, Place, Situation  Normal Continent Weight: 179 lb 3.7 oz (81.3 kg) Height:   6' (182.9 cm)  BEHAVIORAL SYMPTOMS/MOOD NEUROLOGICAL BOWEL NUTRITION STATUS      Continent Diet (Heart Healthy and Carb Modified)  AMBULATORY STATUS COMMUNICATION OF NEEDS Skin   Limited Assist Verbally Surgical wounds (Clean and dry left hip)                       Personal Care Assistance Level of Assistance  Bathing, Feeding, Dressing Bathing Assistance: Limited assistance Feeding assistance: Independent Dressing Assistance: Limited assistance     Functional Limitations Info  Sight, Hearing, Speech Sight Info: Adequate Hearing Info: Adequate Speech Info: Adequate    SPECIAL CARE FACTORS FREQUENCY  PT (By licensed PT), OT (By licensed OT)     PT Frequency: 5 x a week OT Frequency: 5 x a week            Contractures Contractures Info: Not present    Additional Factors Info  Allergies, Code Status Code Status Info: DNR Allergies Info: Flomax and Heparub           Current Medications (06/13/2022):  This is the current hospital active medication list Current Facility-Administered Medications  Medication Dose Route Frequency Provider Last Rate Last Admin   albuterol (PROVENTIL) (2.5 MG/3ML) 0.083% nebulizer solution 3 mL  3 mL Nebulization Q6H PRN Adefeso, Oladapo, DO       amiodarone (PACERONE) tablet 200 mg  200 mg Oral Daily Adefeso, Oladapo, DO   200 mg at 06/13/22 0840   apixaban (ELIQUIS) tablet 5 mg  5 mg Oral BID Manuella Ghazi, Pratik D, DO   5 mg at 06/13/22 0839   HYDROmorphone (DILAUDID) injection 0.5 mg  0.5  mg Intravenous Q4H PRN Adefeso, Oladapo, DO   0.5 mg at 06/13/22 1131   insulin aspart (novoLOG) injection 0-9 Units  0-9 Units Subcutaneous TID WC Adefeso, Oladapo, DO   1 Units at 06/12/22 1217   lenalidomide (REVLIMID) capsule 10 mg  10 mg Oral QHS Adefeso, Oladapo, DO   10 mg at 06/12/22 2210   ondansetron (ZOFRAN) tablet 4 mg  4 mg Oral Q6H PRN Adefeso, Oladapo, DO       Or   ondansetron (ZOFRAN) injection 4 mg  4 mg Intravenous Q6H PRN Adefeso,  Oladapo, DO       pantoprazole (PROTONIX) EC tablet 40 mg  40 mg Oral Daily Adefeso, Oladapo, DO   40 mg at 06/13/22 0840   senna-docusate (Senokot-S) tablet 2 tablet  2 tablet Oral BID Quinn Axe A, NP   2 tablet at 06/13/22 4008     Discharge Medications: Please see discharge summary for a list of discharge medications.  Relevant Imaging Results:  Relevant Lab Results:   Additional Information SSN:  [id number]  HT:  6'     WT:  179 lbs      BMI:  24.31 kg/m2  Iwao Shamblin C Tarpley-Carter, LCSWA

## 2022-06-13 NOTE — Progress Notes (Signed)
TOC CSW currently posted to hub and awaiting bed offers.  Asley Baskerville Tarpley-Carter, MSW, LCSW-A Pronouns:  She/Her/Hers [facility] Clinical Social Worker Direct Number:  [phone] [email or web address]

## 2022-06-13 NOTE — Progress Notes (Signed)
PROGRESS NOTE    Darius Smith  ALP:[id number] DOB: [birth year] DOA: 06/09/2022 PCP: [name], MD   Brief Narrative:  Darius Smith is a 76 y.o. male with medical history significant of non-Hodgkin's lymphoma in remission, T2DM, A-fib on Eliquis, hypertension, GERD who presents to the emergency department via EMS after sustaining a fall at home.  He was noted to have a comminuted left intertrochanteric fracture and is status post IM nail to left hip on 9/22.  Home Eliquis resumed 9/23 and PT/OT evaluation with recommendation for SNF noted.   Assessment & Plan:   Principal Problem:   Closed comminuted intertrochanteric fracture of left femur, initial encounter ([place]) Active Problems:   Essential hypertension   GERD (gastroesophageal reflux disease)   Atrial fibrillation (HCC)   Non Hodgkin's lymphoma (HCC)   Chronic combined systolic and diastolic CHF (congestive heart failure) (HCC)   Stage 3b chronic kidney disease (HCC)   Hypokalemia   Thrombocytopenia (HCC)   Normocytic anemia   Type 2 diabetes mellitus (HCC)   COPD (chronic obstructive pulmonary disease) (HCC)  Assessment and Plan:   Comminuted left intratrochanteric fracture status post IM nail 9/22 Left hip x-ray showed comminuted left intratrochanteric fracture Continue fall precaution and neurochecks Continue Dilaudid as needed IM nail 9/22 PT/OT evaluation with recommendation for SNF Foley catheter removed 9/23 and patient weaned off oxygen     Chronic thrombocytopenia Platelets are 98, continue to monitor platelet levels   Normocytic anemia Hemoglobin at 8, but with no overt bleeding (this was 11.7 on September 27, 2021) Continue to monitor H/H on labs especially with Eliquis resumed   CKD stage IIIb BUN/creatinine 19/1.79 (creatinine is within baseline range) Renally adjust medications, avoid nephrotoxic agents/dehydration/hypotension   A-fib on Eliquis CHA2DS2-VASc score of at least 4 Continue  amiodarone Eliquis resumed 9/23   T2DM Continue ISS and hypoglycemic protocol   Essential hypertension Continue amlodipine, hydralazine   GERD Continue Protonix   Non-Hodgkin's lymphoma Continue lenalidomide   Chronic combined systolic and diastolic CHF Echocardiogram done on 11/26/2020 showed LVEF of 50%.  LV has mildly decreased function.  G1 DD Continue home Lasix   COPD (not in acute exacerbation) Continue Ventolin   Goals of care Appreciate palliative consultation and patient is now DNR     DVT prophylaxis: Eliquis Code Status: DNR Family Communication: Wife at bedside 9/22 Disposition Plan:  Status is: Inpatient Remains inpatient appropriate because: Need for IV medications   Consultants:  Orthopedics   Procedures:  None   Antimicrobials:  None   Subjective: Patient seen and evaluated today with no new acute complaints or concerns. No acute concerns or events noted overnight.  He denies any significant pain.  Objective: Vitals:   06/12/22 1238 06/12/22 2023 06/13/22 0445 06/13/22 0829  BP: (!) 110/55 113/65 (!) 114/59 (!) 118/97  Pulse: 61 64 84 78  Resp: 20 15 16 18   Temp:  98.7 F (37.1 C) 99.6 F (37.6 C) 99.2 F (37.3 C)  TempSrc:   Oral Oral  SpO2: 98% 94% 95% 99%  Weight:      Height:        Intake/Output Summary (Last 24 hours) at 06/13/2022 1015 Last data filed at 06/13/2022 0837 Gross per 24 hour  Intake --  Output 1025 ml  Net -1025 ml   Filed Weights   06/09/22 1723 06/09/22 2337  Weight: 79.4 kg 81.3 kg    Examination:  General exam: Appears calm and comfortable  Respiratory system: Clear to  auscultation. Respiratory effort normal. Cardiovascular system: S1 & S2 heard, RRR.  Gastrointestinal system: Abdomen is soft Central nervous system: Alert and awake Extremities: No edema Skin: No significant lesions noted Psychiatry: Flat affect.    Data Reviewed: I have personally reviewed following labs and imaging  studies  CBC: Recent Labs  Lab 06/09/22 1855 06/10/22 0913 06/11/22 0347 06/12/22 0624 06/13/22 0534  WBC 5.9 6.3 5.5 8.1 8.7  NEUTROABS 4.3  --   --   --   --   HGB 11.9* 10.6* 10.1* 8.8* 8.3*  HCT 34.4* 32.3* 30.4* 26.6* 24.7*  MCV 92.5 97.6 95.9 95.3 95.4  PLT 148* 121* 114* 97* 98*   Basic Metabolic Panel: Recent Labs  Lab 06/09/22 1855 06/10/22 0913 06/11/22 0347 06/12/22 0624 06/13/22 0534  NA 138 139 136 136 135  K 3.4* 4.6 3.8 4.1 3.9  CL 109 112* 104 106 102  CO2 19* 23 24 23 24   GLUCOSE 107* 113* 100* 148* 107*  BUN 19 21 18  25* 28*  CREATININE 1.79* 1.89* 1.64* 1.76* 1.58*  CALCIUM 8.4* 8.3* 8.2* 8.3* 8.2*  MG  --  1.9 1.9 2.0 1.9  PHOS  --  4.6  --   --   --    GFR: Estimated Creatinine Clearance: 43.7 mL/min (A) (by C-G formula based on SCr of 1.58 mg/dL (H)). Liver Function Tests: Recent Labs  Lab 06/10/22 0913  AST 13*  ALT 15  ALKPHOS 57  BILITOT 1.3*  PROT 5.7*  ALBUMIN 3.2*   No results for input(s): "LIPASE", "AMYLASE" in the last 168 hours. No results for input(s): "AMMONIA" in the last 168 hours. Coagulation Profile: No results for input(s): "INR", "PROTIME" in the last 168 hours. Cardiac Enzymes: No results for input(s): "CKTOTAL", "CKMB", "CKMBINDEX", "TROPONINI" in the last 168 hours. BNP (last 3 results) No results for input(s): "PROBNP" in the last 8760 hours. HbA1C: No results for input(s): "HGBA1C" in the last 72 hours. CBG: Recent Labs  Lab 06/12/22 0733 06/12/22 1128 06/12/22 1644 06/12/22 2204 06/13/22 0832  GLUCAP 131* 144* 115* 105* 108*   Lipid Profile: No results for input(s): "CHOL", "HDL", "LDLCALC", "TRIG", "CHOLHDL", "LDLDIRECT" in the last 72 hours. Thyroid Function Tests: No results for input(s): "TSH", "T4TOTAL", "FREET4", "T3FREE", "THYROIDAB" in the last 72 hours. Anemia Panel: No results for input(s): "VITAMINB12", "FOLATE", "FERRITIN", "TIBC", "IRON", "RETICCTPCT" in the last 72 hours. Sepsis  Labs: No results for input(s): "PROCALCITON", "LATICACIDVEN" in the last 168 hours.  Recent Results (from the past 240 hour(s))  Surgical PCR screen     Status: None   Collection Time: 06/10/22  8:31 PM   Specimen: Nasal Mucosa; Nasal Swab  Result Value Ref Range Status   MRSA, PCR NEGATIVE NEGATIVE Final   Staphylococcus aureus NEGATIVE NEGATIVE Final    Comment: (NOTE) The Xpert SA Assay (FDA approved for NASAL specimens in patients 30 years of age and older), is one component of a comprehensive surveillance program. It is not intended to diagnose infection nor to guide or monitor treatment. Performed at [facility], [address] [place], [place] [address]          Radiology Studies: DG HIP UNILAT WITH PELVIS 2-3 VIEWS LEFT  Result Date: 06/11/2022 CLINICAL DATA:  ORIF left femur EXAM: DG HIP (WITH OR WITHOUT PELVIS) 2-3V LEFT COMPARISON:  06/09/2022 FINDINGS: Intraoperative images during left proximal femur ORIF for an intertrochanteric fracture. Improved fracture alignment. Hardware is intact. No evidence of immediate complication. IMPRESSION: Intraoperative images during  left proximal femur ORIF for an intertrochanteric fracture. Improved fracture alignment. No evidence of immediate complication. Electronically Signed   By: Maurine Simmering M.D.   On: 06/11/2022 15:09   DG Pelvis Portable  Result Date: 06/11/2022 CLINICAL DATA:  Postop EXAM: LEFT FEMUR 2 VIEWS; PORTABLE PELVIS 1-2 VIEWS COMPARISON:  None Available. FINDINGS: Postoperative changes of cephalomedullary nailing for an intertrochanteric left femur fracture. Improved fracture alignment. Expected soft tissue changes. Intact hardware without evidence of immediate complication. IMPRESSION: Postoperative changes of left proximal femur ORIF. Improved fracture alignment. No evidence of immediate complication. Electronically Signed   By: Maurine Simmering M.D.   On: 06/11/2022 15:08   DG FEMUR MIN 2 VIEWS LEFT  Result Date:  06/11/2022 CLINICAL DATA:  Postop EXAM: LEFT FEMUR 2 VIEWS; PORTABLE PELVIS 1-2 VIEWS COMPARISON:  None Available. FINDINGS: Postoperative changes of cephalomedullary nailing for an intertrochanteric left femur fracture. Improved fracture alignment. Expected soft tissue changes. Intact hardware without evidence of immediate complication. IMPRESSION: Postoperative changes of left proximal femur ORIF. Improved fracture alignment. No evidence of immediate complication. Electronically Signed   By: Maurine Simmering M.D.   On: 06/11/2022 15:08   DG C-Arm 1-60 Min-No Report  Result Date: 06/11/2022 Fluoroscopy was utilized by the requesting physician.  No radiographic interpretation.        Scheduled Meds:  amiodarone  200 mg Oral Daily   amLODipine  10 mg Oral Daily   apixaban  5 mg Oral BID   furosemide  20 mg Oral QODAY   hydrALAZINE  100 mg Oral BID   insulin aspart  0-9 Units Subcutaneous TID WC   lenalidomide  10 mg Oral QHS   pantoprazole  40 mg Oral Daily   senna-docusate  2 tablet Oral BID     LOS: 4 days    Time spent: 35 minutes    Lynnelle Mesmer Darleen Crocker, DO [facility]  If 7PM-7AM, please contact night-coverage [email or web address] 06/13/2022, 10:15 AM

## 2022-06-13 NOTE — Progress Notes (Signed)
TOC CSW obtained PASSAR #  [id number]  Eileen Kangas Tarpley-Carter, MSW, LCSW-A Pronouns:  She/Her/Hers [facility] Clinical Social Worker Direct Number:  [phone] [email or web address]

## 2022-06-13 NOTE — Progress Notes (Signed)
Concerted 2493 to AFIB with RVR  HR running 110-120s per telemetry. MD informed.

## 2022-06-14 DIAGNOSIS — I5042 Chronic combined systolic (congestive) and diastolic (congestive) heart failure: Secondary | ICD-10-CM | POA: Diagnosis not present

## 2022-06-14 DIAGNOSIS — D62 Acute posthemorrhagic anemia: Secondary | ICD-10-CM | POA: Diagnosis not present

## 2022-06-14 DIAGNOSIS — Z794 Long term (current) use of insulin: Secondary | ICD-10-CM | POA: Diagnosis not present

## 2022-06-14 DIAGNOSIS — E1122 Type 2 diabetes mellitus with diabetic chronic kidney disease: Secondary | ICD-10-CM | POA: Diagnosis not present

## 2022-06-14 DIAGNOSIS — Z515 Encounter for palliative care: Secondary | ICD-10-CM | POA: Diagnosis not present

## 2022-06-14 DIAGNOSIS — S72142D Displaced intertrochanteric fracture of left femur, subsequent encounter for closed fracture with routine healing: Secondary | ICD-10-CM | POA: Diagnosis not present

## 2022-06-14 DIAGNOSIS — N390 Urinary tract infection, site not specified: Secondary | ICD-10-CM | POA: Diagnosis present

## 2022-06-14 DIAGNOSIS — Z1611 Resistance to penicillins: Secondary | ICD-10-CM | POA: Diagnosis present

## 2022-06-14 DIAGNOSIS — I480 Paroxysmal atrial fibrillation: Secondary | ICD-10-CM | POA: Diagnosis not present

## 2022-06-14 DIAGNOSIS — N170 Acute kidney failure with tubular necrosis: Secondary | ICD-10-CM | POA: Diagnosis not present

## 2022-06-14 DIAGNOSIS — E876 Hypokalemia: Secondary | ICD-10-CM | POA: Diagnosis not present

## 2022-06-14 DIAGNOSIS — R6521 Severe sepsis with septic shock: Secondary | ICD-10-CM | POA: Diagnosis not present

## 2022-06-14 DIAGNOSIS — D61818 Other pancytopenia: Secondary | ICD-10-CM | POA: Diagnosis not present

## 2022-06-14 DIAGNOSIS — M6281 Muscle weakness (generalized): Secondary | ICD-10-CM | POA: Diagnosis not present

## 2022-06-14 DIAGNOSIS — Z66 Do not resuscitate: Secondary | ICD-10-CM | POA: Diagnosis not present

## 2022-06-14 DIAGNOSIS — J449 Chronic obstructive pulmonary disease, unspecified: Secondary | ICD-10-CM | POA: Diagnosis not present

## 2022-06-14 DIAGNOSIS — M96840 Postprocedural hematoma of a musculoskeletal structure following a musculoskeletal system procedure: Secondary | ICD-10-CM | POA: Diagnosis present

## 2022-06-14 DIAGNOSIS — S72142A Displaced intertrochanteric fracture of left femur, initial encounter for closed fracture: Secondary | ICD-10-CM | POA: Diagnosis not present

## 2022-06-14 DIAGNOSIS — M25552 Pain in left hip: Secondary | ICD-10-CM | POA: Diagnosis not present

## 2022-06-14 DIAGNOSIS — R262 Difficulty in walking, not elsewhere classified: Secondary | ICD-10-CM | POA: Diagnosis not present

## 2022-06-14 DIAGNOSIS — W19XXXA Unspecified fall, initial encounter: Secondary | ICD-10-CM | POA: Diagnosis present

## 2022-06-14 DIAGNOSIS — K219 Gastro-esophageal reflux disease without esophagitis: Secondary | ICD-10-CM | POA: Diagnosis not present

## 2022-06-14 DIAGNOSIS — E871 Hypo-osmolality and hyponatremia: Secondary | ICD-10-CM | POA: Diagnosis not present

## 2022-06-14 DIAGNOSIS — N1832 Chronic kidney disease, stage 3b: Secondary | ICD-10-CM | POA: Diagnosis not present

## 2022-06-14 DIAGNOSIS — I9589 Other hypotension: Secondary | ICD-10-CM | POA: Diagnosis not present

## 2022-06-14 DIAGNOSIS — C8258 Diffuse follicle center lymphoma, lymph nodes of multiple sites: Secondary | ICD-10-CM | POA: Diagnosis not present

## 2022-06-14 DIAGNOSIS — A419 Sepsis, unspecified organism: Secondary | ICD-10-CM | POA: Diagnosis not present

## 2022-06-14 DIAGNOSIS — D61810 Antineoplastic chemotherapy induced pancytopenia: Secondary | ICD-10-CM | POA: Diagnosis not present

## 2022-06-14 DIAGNOSIS — C8190 Hodgkin lymphoma, unspecified, unspecified site: Secondary | ICD-10-CM | POA: Diagnosis not present

## 2022-06-14 DIAGNOSIS — I130 Hypertensive heart and chronic kidney disease with heart failure and stage 1 through stage 4 chronic kidney disease, or unspecified chronic kidney disease: Secondary | ICD-10-CM | POA: Diagnosis not present

## 2022-06-14 DIAGNOSIS — R278 Other lack of coordination: Secondary | ICD-10-CM | POA: Diagnosis not present

## 2022-06-14 DIAGNOSIS — D6832 Hemorrhagic disorder due to extrinsic circulating anticoagulants: Secondary | ICD-10-CM | POA: Diagnosis present

## 2022-06-14 DIAGNOSIS — E1159 Type 2 diabetes mellitus with other circulatory complications: Secondary | ICD-10-CM | POA: Diagnosis not present

## 2022-06-14 DIAGNOSIS — E861 Hypovolemia: Secondary | ICD-10-CM | POA: Diagnosis not present

## 2022-06-14 DIAGNOSIS — Z20822 Contact with and (suspected) exposure to covid-19: Secondary | ICD-10-CM | POA: Diagnosis not present

## 2022-06-14 DIAGNOSIS — B9620 Unspecified Escherichia coli [E. coli] as the cause of diseases classified elsewhere: Secondary | ICD-10-CM | POA: Diagnosis not present

## 2022-06-14 DIAGNOSIS — S72142G Displaced intertrochanteric fracture of left femur, subsequent encounter for closed fracture with delayed healing: Secondary | ICD-10-CM | POA: Diagnosis not present

## 2022-06-14 DIAGNOSIS — I10 Essential (primary) hypertension: Secondary | ICD-10-CM | POA: Diagnosis not present

## 2022-06-14 DIAGNOSIS — A4151 Sepsis due to Escherichia coli [E. coli]: Secondary | ICD-10-CM | POA: Diagnosis not present

## 2022-06-14 DIAGNOSIS — I959 Hypotension, unspecified: Secondary | ICD-10-CM | POA: Diagnosis not present

## 2022-06-14 DIAGNOSIS — N179 Acute kidney failure, unspecified: Secondary | ICD-10-CM | POA: Diagnosis not present

## 2022-06-14 DIAGNOSIS — R55 Syncope and collapse: Secondary | ICD-10-CM | POA: Diagnosis not present

## 2022-06-14 DIAGNOSIS — C3490 Malignant neoplasm of unspecified part of unspecified bronchus or lung: Secondary | ICD-10-CM | POA: Diagnosis not present

## 2022-06-14 DIAGNOSIS — D509 Iron deficiency anemia, unspecified: Secondary | ICD-10-CM | POA: Diagnosis not present

## 2022-06-14 DIAGNOSIS — I152 Hypertension secondary to endocrine disorders: Secondary | ICD-10-CM | POA: Diagnosis not present

## 2022-06-14 DIAGNOSIS — Y838 Other surgical procedures as the cause of abnormal reaction of the patient, or of later complication, without mention of misadventure at the time of the procedure: Secondary | ICD-10-CM | POA: Diagnosis present

## 2022-06-14 DIAGNOSIS — I482 Chronic atrial fibrillation, unspecified: Secondary | ICD-10-CM | POA: Diagnosis not present

## 2022-06-14 DIAGNOSIS — I4821 Permanent atrial fibrillation: Secondary | ICD-10-CM | POA: Diagnosis present

## 2022-06-14 DIAGNOSIS — Z7189 Other specified counseling: Secondary | ICD-10-CM | POA: Diagnosis not present

## 2022-06-14 DIAGNOSIS — D649 Anemia, unspecified: Secondary | ICD-10-CM | POA: Diagnosis not present

## 2022-06-14 DIAGNOSIS — D696 Thrombocytopenia, unspecified: Secondary | ICD-10-CM | POA: Diagnosis not present

## 2022-06-14 DIAGNOSIS — C8590 Non-Hodgkin lymphoma, unspecified, unspecified site: Secondary | ICD-10-CM | POA: Diagnosis not present

## 2022-06-14 DIAGNOSIS — C7931 Secondary malignant neoplasm of brain: Secondary | ICD-10-CM | POA: Diagnosis not present

## 2022-06-14 LAB — BASIC METABOLIC PANEL
Anion gap: 7 (ref 5–15)
BUN: 28 mg/dL — ABNORMAL HIGH (ref 8–23)
CO2: 26 mmol/L (ref 22–32)
Calcium: 8.2 mg/dL — ABNORMAL LOW (ref 8.9–10.3)
Chloride: 102 mmol/L (ref 98–111)
Creatinine, Ser: 1.72 mg/dL — ABNORMAL HIGH (ref 0.61–1.24)
GFR, Estimated: 41 mL/min — ABNORMAL LOW (ref >60)
Glucose, Bld: 127 mg/dL — ABNORMAL HIGH (ref 70–99)
Potassium: 4 mmol/L (ref 3.5–5.1)
Sodium: 135 mmol/L (ref 135–145)

## 2022-06-14 LAB — CBC
HCT: 24.3 % — ABNORMAL LOW (ref 39.0–52.0)
Hemoglobin: 8.1 g/dL — ABNORMAL LOW (ref 13.0–17.0)
MCH: 31.8 pg (ref 26.0–34.0)
MCHC: 33.3 g/dL (ref 30.0–36.0)
MCV: 95.3 fL (ref 80.0–100.0)
Platelets: 76 10*3/uL — ABNORMAL LOW (ref 150–400)
RBC: 2.55 MIL/uL — ABNORMAL LOW (ref 4.22–5.81)
RDW: 14.6 % (ref 11.5–15.5)
WBC: 8.7 10*3/uL (ref 4.0–10.5)
nRBC: 0 % (ref 0.0–0.2)

## 2022-06-14 LAB — MAGNESIUM: Magnesium: 1.9 mg/dL (ref 1.7–2.4)

## 2022-06-14 LAB — GLUCOSE, CAPILLARY
Glucose-Capillary: 124 mg/dL — ABNORMAL HIGH (ref 70–99)
Glucose-Capillary: 191 mg/dL — ABNORMAL HIGH (ref 70–99)
Glucose-Capillary: 135 mg/dL — ABNORMAL HIGH (ref 70–99)
Glucose-Capillary: 121 mg/dL — ABNORMAL HIGH (ref 70–99)

## 2022-06-14 LAB — SARS CORONAVIRUS 2 BY RT PCR: SARS Coronavirus 2 by RT PCR: NEGATIVE

## 2022-06-14 MED ORDER — ORAL CARE MOUTH RINSE: 15.0000 mL | OROMUCOSAL | Status: DC | PRN

## 2022-06-14 MED ORDER — MILK AND MOLASSES ENEMA: 1.0000 | Freq: Once | RECTAL | Status: DC

## 2022-06-14 MED ORDER — BISACODYL 10 MG RE SUPP
10.0000 mg | Freq: Every day | RECTAL | Status: DC | PRN
  Administered 2022-06-14: 10 mg via RECTAL
  Filled 2022-06-14: qty 1

## 2022-06-14 MED ORDER — OXYCODONE HCL 5 MG PO TABS: 5.0000 mg | ORAL_TABLET | Freq: Three times a day (TID) | ORAL | 0 refills | Status: DC | PRN

## 2022-06-14 NOTE — Evaluation (Signed)
Occupational Therapy Evaluation Patient Details Name: Darius Smith MRN: [id number] DOB: [birth year] Today's Date: 06/14/2022   History of Present Illness 76 y.o. M admitted on 06/09/22 due to a fall at home. Pt was found to have a comminuted left intertrochanteric fracture and is status post IM nail to left hip on 9/22. PMH significant for non-Hodgkin's lymphoma in remission, T2DM, A-fib on Eliquis, hypertension, GERD.   Clinical Impression   Pt admitted for concerns listed above. PTA pt reported that he was fairly independent with all ADL's and functional mobility, however wife states that she assists him from time to time when he needs help. At this time, pt is very limited by pain and was unable to tolerate coming to a full standing due to pain. OT assisted pt with leg exercises in the chair and set up him for lunch. Recommending SNF to maximize independence and safety. OT will follow acutely.       Recommendations for follow up therapy are one component of a multi-disciplinary discharge planning process, led by the attending physician.  Recommendations may be updated based on patient status, additional functional criteria and insurance authorization.   Follow Up Recommendations  Skilled nursing-short term rehab (<3 hours/day)    Assistance Recommended at Discharge Frequent or constant Supervision/Assistance  Patient can return home with the following Two people to help with walking and/or transfers;Two people to help with bathing/dressing/bathroom;Help with stairs or ramp for entrance;Assist for transportation;Direct supervision/assist for medications management;Assistance with cooking/housework    Functional Status Assessment  Patient has had a recent decline in their functional status and demonstrates the ability to make significant improvements in function in a reasonable and predictable amount of time.  Equipment Recommendations  None recommended by OT    Recommendations for Other  Services       Precautions / Restrictions Precautions Precautions: Fall Restrictions Weight Bearing Restrictions: Yes LLE Weight Bearing: Weight bearing as tolerated      Mobility Bed Mobility               General bed mobility comments: Up in recliner    Transfers Overall transfer level: Needs assistance Equipment used: Rolling walker (2 wheels) Transfers: Sit to/from Stand Sit to Stand: Max assist           General transfer comment: Unable to tolerate coming to stand from chair without +2 assist      Balance Overall balance assessment: Needs assistance Sitting-balance support: Feet supported, Bilateral upper extremity supported Sitting balance-Leahy Scale: Poor Sitting balance - Comments: Poor pain tolerance                                   ADL either performed or assessed with clinical judgement   ADL Overall ADL's : Needs assistance/impaired Eating/Feeding: Set up;Sitting   Grooming: Set up;Sitting   Upper Body Bathing: Minimal assistance;Sitting   Lower Body Bathing: Maximal assistance;+2 for safety/equipment;Sitting/lateral leans;Sit to/from stand   Upper Body Dressing : Minimal assistance;Sitting   Lower Body Dressing: Maximal assistance;+2 for safety/equipment;Sitting/lateral leans;Sit to/from stand   Toilet Transfer: Maximal assistance;+2 for safety/equipment;Stand-pivot   Toileting- Clothing Manipulation and Hygiene: Maximal assistance;+2 for safety/equipment;Sitting/lateral lean;Sit to/from stand         General ADL Comments: Pt very limited by pain this session, requiring up to max A +2 for mobilizing and attempting to stand.     Vision Baseline Vision/History: 1 Wears glasses Ability to See in  Adequate Light: 0 Adequate Patient Visual Report: No change from baseline Vision Assessment?: No apparent visual deficits     Perception     Praxis      Pertinent Vitals/Pain Pain Assessment Pain Assessment:  Faces Faces Pain Scale: Hurts even more Pain Location: L hip Pain Descriptors / Indicators: Aching, Discomfort, Grimacing, Guarding Pain Intervention(s): Limited activity within patient's tolerance, Monitored during session, Repositioned     Hand Dominance Right   Extremity/Trunk Assessment Upper Extremity Assessment Upper Extremity Assessment: Generalized weakness   Lower Extremity Assessment Lower Extremity Assessment: Defer to PT evaluation   Cervical / Trunk Assessment Cervical / Trunk Assessment: Kyphotic   Communication Communication Communication: No difficulties   Cognition Arousal/Alertness: Awake/alert Behavior During Therapy: WFL for tasks assessed/performed Overall Cognitive Status: Impaired/Different from baseline Area of Impairment: Attention, Following commands, Safety/judgement, Problem solving                   Current Attention Level: Sustained   Following Commands: Follows one step commands with increased time Safety/Judgement: Decreased awareness of safety   Problem Solving: Slow processing, Decreased initiation General Comments: Requiring verbal cues to initate and continue tasks, attention span is limited     General Comments  VSS on RA    Exercises Exercises: General Lower Extremity General Exercises - Lower Extremity Ankle Circles/Pumps: Both, 10 reps, Seated Quad Sets: Both, 10 reps, Seated   Shoulder Instructions      Home Living Family/patient expects to be discharged to:: Private residence Living Arrangements: Spouse/significant other Available Help at Discharge: Family;Available 24 hours/day Type of Home: House Home Access: Ramped entrance     Home Layout: One level     Bathroom Shower/Tub: Occupational psychologist: Handicapped height Bathroom Accessibility: Yes How Accessible: Accessible via walker Home Equipment: BSC/3in1;Cane - single point;Transport chair;Shower Land (2 wheels)           Prior Functioning/Environment Prior Level of Function : Needs assist             Mobility Comments: Uses a RW and wife assists as needed ADLs Comments: Wife assists with all ADL's as needed        OT Problem List: Decreased strength;Decreased range of motion;Decreased activity tolerance;Impaired balance (sitting and/or standing);Decreased coordination;Decreased cognition;Decreased safety awareness;Pain      OT Treatment/Interventions: Self-care/ADL training;Therapeutic exercise;Energy conservation;DME and/or AE instruction;Therapeutic activities;Cognitive remediation/compensation;Patient/family education;Balance training    OT Goals(Current goals can be found in the care plan section) Acute Rehab OT Goals Patient Stated Goal: To lessen pain OT Goal Formulation: With patient Time For Goal Achievement: 06/28/22 Potential to Achieve Goals: Good ADL Goals Pt Will Perform Lower Body Bathing: with mod assist;with adaptive equipment;sitting/lateral leans;sit to/from stand Pt Will Perform Lower Body Dressing: with mod assist;with adaptive equipment;sitting/lateral leans;sit to/from stand Pt Will Transfer to Toilet: with mod assist;stand pivot transfer Pt Will Perform Toileting - Clothing Manipulation and hygiene: with mod assist;with adaptive equipment;sitting/lateral leans;sit to/from stand  OT Frequency: Min 1X/week    Co-evaluation              AM-PAC OT "6 Clicks" Daily Activity     Outcome Measure Help from another person eating meals?: A Little Help from another person taking care of personal grooming?: A Little Help from another person toileting, which includes using toliet, bedpan, or urinal?: A Lot Help from another person bathing (including washing, rinsing, drying)?: A Lot Help from another person to put on and taking off regular upper body clothing?:  A Lot Help from another person to put on and taking off regular lower body clothing?: A Lot 6 Click Score: 14   End  of Session Equipment Utilized During Treatment: Gait belt;Rolling walker (2 wheels) Nurse Communication: Mobility status  Activity Tolerance: Patient limited by pain Patient left: with call bell/phone within reach;in chair  OT Visit Diagnosis: Unsteadiness on feet (R26.81);Other abnormalities of gait and mobility (R26.89);Muscle weakness (generalized) (M62.81)                Time: 5643-3295 OT Time Calculation (min): 12 min Charges:  OT General Charges $OT Visit: 1 Visit OT Evaluation $OT Eval Moderate Complexity: [place], OTR/L [facility] Rehab Department  Lulia Schriner Isabelle Course 06/14/2022, 12:35 PM

## 2022-06-14 NOTE — Progress Notes (Signed)
ORTHOPAEDIC PROGRESS NOTE  S/P:   Cephalomedullary nail for left intertrochanteric femur fracture   DOS: 06/11/2022  SUBJECTIVE: He states he is doing better today.  No problems overnight.  He worked well with physical therapy this morning.  Discharge order has been placed, and is planning to be discharged to a rehab facility.  OBJECTIVE: PE:  Alert and oriented, no acute distress Comfortable, sitting up in bedside chair  Mild swelling about the hip. No bruising Lateral hip dressings are clean, dry and intact Toes are warm and well perfused Sensation intact to dorsum of the left foot Active motion in TA/EHL  Vitals:   06/13/22 2101 06/14/22 0445  BP: (!) 119/55 118/63  Pulse: 63 77  Resp: 18 18  Temp: 98.5 F (36.9 C) 98.4 F (36.9 C)  SpO2: 99% 99%      Latest Ref Rng & Units 06/14/2022    6:47 AM 06/13/2022    5:34 AM 06/12/2022    6:24 AM  CBC  WBC 4.0 - 10.5 K/uL 8.7  8.7  8.1   Hemoglobin 13.0 - 17.0 g/dL 8.1  8.3  8.8   Hematocrit 39.0 - 52.0 % 24.3  24.7  26.6   Platelets 150 - 400 K/uL 76  98  97      ASSESSMENT: Darius Smith is a 76 y.o. male stable following left hip surgery.  DC to rehab facility today.   PLAN: Weightbearing: WBAT LLE Insicional and dressing care: Reinforce dressings as needed Orthopedic device(s): None VTE prophylaxis: resume Eliquis following surgery Pain control: PRN medications, judicious use of narcotics Follow - up plan: 2 weeks postop.  If he is discharged to a nursing facility, sutures can be trimmed at 2 weeks, and I will see him at 6 weeks following surgery.  If he has any issues in a time, I am happy to see him in clinic.   Contact information:     Oma Alpert A. Amedeo Kinsman, MD [place] [address] [place],  [place]  [address] Phone: [phone] Fax: [phone]

## 2022-06-14 NOTE — TOC Transition Note (Signed)
Transition of Care [facility]) - CM/SW Discharge Note   Patient Details  Name: Darius Smith MRN: [id number] Date of Birth: 06-Apr-1946  Transition of Care [facility]) CM/SW Contact:  Salome Arnt, LCSW Phone Number: 06/14/2022, 3:15 PM   Clinical Narrative:  Pt d/c today. Bed offers provided and pt/wife choose [facility]. Authorization received. D/C summary sent to SNF. RN given number to call report. COVID negative today. Will transfer to [facility] with staff.      Final next level of care: [place] Barriers to Discharge: Barriers Resolved   Patient Goals and CMS Choice     Choice offered to / list presented to : Patient  Discharge Placement              Patient chooses bed at: [facility] Patient to be transferred to facility by: RN Name of family member notified: wife Patient and family notified of of transfer: 06/14/22  Discharge Plan and Services                                     Social Determinants of Health (SDOH) Interventions Housing Interventions: Intervention Not Indicated   Readmission Risk Interventions    08/04/2020    4:53 PM 08/01/2020    9:34 AM  Readmission Risk Prevention Plan  Transportation Screening Complete Complete  HRI or Home Care Consult Complete Complete  Social Work Consult for [place] Planning/Counseling Complete Complete  Palliative Care Screening Complete Not Applicable  Medication Review Press photographer) Complete Complete

## 2022-06-14 NOTE — Plan of Care (Signed)
  Problem: Education: Goal: Knowledge of General Education information will improve Description: Including pain rating scale, medication(s)/side effects and non-pharmacologic comfort measures Outcome: Completed/Met   Problem: Health Behavior/Discharge Planning: Goal: Ability to manage health-related needs will improve Outcome: Completed/Met   Problem: Clinical Measurements: Goal: Ability to maintain clinical measurements within normal limits will improve Outcome: Completed/Met Goal: Will remain free from infection Outcome: Completed/Met Goal: Diagnostic test results will improve Outcome: Completed/Met Goal: Cardiovascular complication will be avoided Outcome: Completed/Met   Problem: Activity: Goal: Risk for activity intolerance will decrease Outcome: Completed/Met   Problem: Nutrition: Goal: Adequate nutrition will be maintained Outcome: Completed/Met   Problem: Coping: Goal: Level of anxiety will decrease Outcome: Completed/Met   Problem: Elimination: Goal: Will not experience complications related to bowel motility Outcome: Completed/Met Goal: Will not experience complications related to urinary retention Outcome: Completed/Met   Problem: Pain Managment: Goal: General experience of comfort will improve Outcome: Completed/Met   Problem: Safety: Goal: Ability to remain free from injury will improve Outcome: Completed/Met   Problem: Skin Integrity: Goal: Risk for impaired skin integrity will decrease Outcome: Completed/Met   Problem: Education: Goal: Ability to describe self-care measures that may prevent or decrease complications (Diabetes Survival Skills Education) will improve Outcome: Completed/Met Goal: Individualized Educational Video(s) Outcome: Completed/Met   Problem: Coping: Goal: Ability to adjust to condition or change in health will improve Outcome: Completed/Met   Problem: Fluid Volume: Goal: Ability to maintain a balanced intake and output  will improve Outcome: Completed/Met   Problem: Health Behavior/Discharge Planning: Goal: Ability to identify and utilize available resources and services will improve Outcome: Completed/Met Goal: Ability to manage health-related needs will improve Outcome: Completed/Met   Problem: Skin Integrity: Goal: Risk for impaired skin integrity will decrease Outcome: Completed/Met   Problem: Tissue Perfusion: Goal: Adequacy of tissue perfusion will improve Outcome: Completed/Met   Problem: Education: Goal: Ability to describe self-care measures that may prevent or decrease complications (Diabetes Survival Skills Education) will improve Outcome: Completed/Met   Problem: Skin Integrity: Goal: Risk for impaired skin integrity will decrease Outcome: Completed/Met

## 2022-06-14 NOTE — Care Management Important Message (Signed)
Important Message  Patient Details  Name: Darius Smith MRN: [id number] Date of Birth: 04/20/1946   Medicare Important Message Given:  Yes (spoke with wife [name] at [phone] to review letter, no additonal copy needed)     Tommy Medal 06/14/2022, 11:30 AM

## 2022-06-14 NOTE — Progress Notes (Signed)
Palliative: [name] is sitting up in the [place] chair in his room.  He greets me, making and somewhat keeping eye contact.  Overall, he is stable from his hip repair.  He is alert and oriented, able to make his basic needs known.  His wife is present at bedside.  We talk about his mobility, and I encouraged him to continue to move.  I encouraged him to find a balance with pain medications in order to keep moving.  We also talk about bowel regimen.  He has been taking Senna-S 2 tabs twice daily but has not had a BM since arrival.  He is agreeable to suppository/enema as needed.  Nursing staff updated.  We talk about discharge to rehab.  I encourage Darius Smith and his wife to participate in rehab is much as possible.  Conference with attending, bedside nursing staff, transition of care team related to patient condition, needs, goals of care, disposition.  Plan: At this point continue to treat the treatable but no CPR or intubation.  Short-term rehab at [facility].  Ultimate goal is to return home.  70 minutes Darius Axe, NP Palliative medicine team Team phone [phone] Greater than 50% of this time was spent counseling and coordinating care related to the above assessment and plan.

## 2022-06-14 NOTE — Discharge Summary (Signed)
Physician Discharge Summary  [name]:[id number] DOB: 1945/11/20 DOA: 06/09/2022  PCP: [name], MD  Admit date: 06/09/2022  Discharge date: 06/14/2022  Admitted From:Home  Disposition:  SNF  Recommendations for Outpatient Follow-up:  Follow up with PCP in 1-2 weeks Follow-up with Dr. Amedeo Kinsman in 2 weeks for staple removal as recommended Continue on Eliquis for anticoagulation Pain medication prescribed as noted below Continue other home medications as prior  Home Health: None  Equipment/Devices: None  Discharge Condition:Stable  CODE STATUS: DNR  Diet recommendation: Heart Healthy  Brief/Interim Summary: Darius Smith is a 76 y.o. male with medical history significant of non-Hodgkin's lymphoma in remission, T2DM, A-fib on Eliquis, hypertension, GERD who presents to the emergency department via EMS after sustaining a fall at home.  He was noted to have a comminuted left intertrochanteric fracture and is status post IM nail to left hip on 9/22.  Home Eliquis resumed 9/23 and PT/OT evaluation with recommendation for SNF noted.  He is now in stable condition for discharge with no other acute events or concerns noted during the course of this admission.  Discharge Diagnoses:  Principal Problem:   Closed comminuted intertrochanteric fracture of left femur, initial encounter ([place]) Active Problems:   Essential hypertension   GERD (gastroesophageal reflux disease)   Atrial fibrillation (HCC)   Non Hodgkin's lymphoma (HCC)   Chronic combined systolic and diastolic CHF (congestive heart failure) (HCC)   Stage 3b chronic kidney disease (HCC)   Hypokalemia   Thrombocytopenia (HCC)   Normocytic anemia   Type 2 diabetes mellitus (HCC)   COPD (chronic obstructive pulmonary disease) ([place])  Principal discharge diagnosis: Comminuted left intertrochanteric fracture status post IM nail 9/22  Discharge Instructions  Discharge Instructions     Ambulatory referral to  Orthopedic Surgery   Complete by: As directed    Diet - low sodium heart healthy   Complete by: As directed    If the dressing is still on your incision site when you go home, remove it on the third day after your surgery date. Remove dressing if it begins to fall off, or if it is dirty or damaged before the third day.   Complete by: As directed    Increase activity slowly   Complete by: As directed       Allergies as of 06/14/2022       Reactions   Flomax [tamsulosin Hcl] Nausea Only   Dizziness  Pt is on flomax   Heparin Other (See Comments)   Tingling in face and shortness of breath        Medication List     TAKE these medications    acetaminophen 500 MG tablet Commonly known as: TYLENOL Take 1,000 mg by mouth every 6 (six) hours as needed for moderate pain or headache.   amiodarone 200 MG tablet Commonly known as: PACERONE TAKE 1 TABLET BY MOUTH ONCE A DAY.   amLODipine 10 MG tablet Commonly known as: NORVASC TAKE 1 TABLET BY MOUTH ONCE DAILY.   blood glucose meter kit and supplies Kit Inject 1 each into the skin 4 (four) times daily -  before meals and at bedtime. Dispense based on patient and insurance preference. Use up to four times daily as directed.   cetirizine 10 MG tablet Commonly known as: ZYRTEC Take 1 tablet (10 mg total) by mouth daily as needed for allergies.   Eliquis 5 MG Tabs tablet Generic drug: apixaban TAKE ONE TABLET BY MOUTH 2 TIMES A DAY.  FreeStyle Libre 2 Reader [employer] Use to monitor blood glucose continuously   [employer] 2 Sensor Misc USE TO MONITOR BLOOD SUGAR DAILY. (CHANGE EVERY 2WEEKS)   furosemide 20 MG tablet Commonly known as: LASIX Take 1 tablet (20 mg total) by mouth every other day. (May take an additional tab as needed.)   hydrALAZINE 100 MG tablet Commonly known as: APRESOLINE Take 1 tablet (100 mg total) by mouth 2 (two) times daily.   insulin lispro 100 UNIT/ML KwikPen Commonly known as: HumaLOG  KwikPen Inject 1-10 Units into the skin 4 (four) times daily -  before meals and at bedtime. Blood sugars 70-120: 1 unit 121-150: 2 units 151-200: 3 units 201-250: 4 units 251-300: 5 units 301-350: 6 units 351-400: 9 units More than 400, 10 units and call your doctor   Insulin Pen Needle 29G X 12MM Misc 1 each by Does not apply route 4 (four) times daily -  before meals and at bedtime.   isosorbide mononitrate 30 MG 24 hr tablet Commonly known as: IMDUR TAKE 1/2 TABLET BY MOUTH ONCE DAILY.   lenalidomide 10 MG capsule Commonly known as: REVLIMID Take 10 mg by mouth. Take 10 mg on days 1-21 of a 28 day cycle.   omeprazole 20 MG capsule Commonly known as: PRILOSEC Take 1 capsule (20 mg total) by mouth daily as needed (reflux).   OneTouch Delica Plus [id number] Misc 1 each by Other route 4 (four) times daily.   OneTouch Ultra test strip Generic drug: glucose blood 1 each by Other route 4 (four) times daily.   oxyCODONE 5 MG immediate release tablet Commonly known as: Roxicodone Take 1 tablet (5 mg total) by mouth every 8 (eight) hours as needed for breakthrough pain.   potassium chloride SA 20 MEQ tablet Commonly known as: KLOR-CON M TAKE ONE TABLET BY MOUTH ONCE DAILY.   Ventolin HFA 108 (90 Base) MCG/ACT inhaler Generic drug: albuterol Inhale 2 puffs into the lungs every 6 (six) hours as needed for wheezing or shortness of breath.               Discharge Care Instructions  (From admission, onward)           Start     Ordered   06/14/22 0000  If the dressing is still on your incision site when you go home, remove it on the third day after your surgery date. Remove dressing if it begins to fall off, or if it is dirty or damaged before the third day.        06/14/22 1046            Contact information for follow-up providers     [name], MD. Schedule an appointment as soon as possible for a visit in 1 week(s).   Specialty: Internal Medicine Contact  information: [address] [place] [address] [phone]         Mordecai Rasmussen, MD. Go in 2 week(s).   Specialties: Orthopedic Surgery, Sports Medicine Contact information: [place]. [address] [place] [address] [phone]              Contact information for after-discharge care     [facility] .   Service: Skilled Nursing Contact information: [address] [place] [address] [phone]                    Allergies  Allergen Reactions  Flomax [Tamsulosin Hcl] Nausea Only    Dizziness  Pt is on flomax   Heparin Other (See Comments)    Tingling in face and shortness of breath    Consultations: Orthopedics   Procedures/Studies: DG HIP UNILAT WITH PELVIS 2-3 VIEWS LEFT  Result Date: 06/11/2022 CLINICAL DATA:  ORIF left femur EXAM: DG HIP (WITH OR WITHOUT PELVIS) 2-3V LEFT COMPARISON:  06/09/2022 FINDINGS: Intraoperative images during left proximal femur ORIF for an intertrochanteric fracture. Improved fracture alignment. Hardware is intact. No evidence of immediate complication. IMPRESSION: Intraoperative images during left proximal femur ORIF for an intertrochanteric fracture. Improved fracture alignment. No evidence of immediate complication. Electronically Signed   By: Maurine Simmering M.D.   On: 06/11/2022 15:09   DG Pelvis Portable  Result Date: 06/11/2022 CLINICAL DATA:  Postop EXAM: LEFT FEMUR 2 VIEWS; PORTABLE PELVIS 1-2 VIEWS COMPARISON:  None Available. FINDINGS: Postoperative changes of cephalomedullary nailing for an intertrochanteric left femur fracture. Improved fracture alignment. Expected soft tissue changes. Intact hardware without evidence of immediate complication. IMPRESSION: Postoperative changes of left proximal femur ORIF. Improved fracture alignment. No evidence of immediate complication. Electronically Signed   By: Maurine Simmering M.D.   On: 06/11/2022 15:08   DG FEMUR  MIN 2 VIEWS LEFT  Result Date: 06/11/2022 CLINICAL DATA:  Postop EXAM: LEFT FEMUR 2 VIEWS; PORTABLE PELVIS 1-2 VIEWS COMPARISON:  None Available. FINDINGS: Postoperative changes of cephalomedullary nailing for an intertrochanteric left femur fracture. Improved fracture alignment. Expected soft tissue changes. Intact hardware without evidence of immediate complication. IMPRESSION: Postoperative changes of left proximal femur ORIF. Improved fracture alignment. No evidence of immediate complication. Electronically Signed   By: Maurine Simmering M.D.   On: 06/11/2022 15:08   DG C-Arm 1-60 Min-No Report  Result Date: 06/11/2022 Fluoroscopy was utilized by the requesting physician.  No radiographic interpretation.   DG Hip Unilat W or Wo Pelvis 2-3 Views Left  Result Date: 06/09/2022 CLINICAL DATA:  Recent fall with left hip pain, initial encounter EXAM: DG HIP (WITH OR WITHOUT PELVIS) 3V LEFT COMPARISON:  None Available. FINDINGS: Comminuted left intratrochanteric fracture is noted with mild impaction at the fracture site. No dislocation is noted. The pelvic ring appears intact. No other focal abnormality is noted. IMPRESSION: Comminuted left intratrochanteric fracture. Electronically Signed   By: Inez Catalina M.D.   On: 06/09/2022 18:59     Discharge Exam: Vitals:   06/13/22 2101 06/14/22 0445  BP: (!) 119/55 118/63  Pulse: 63 77  Resp: 18 18  Temp: 98.5 F (36.9 C) 98.4 F (36.9 C)  SpO2: 99% 99%   Vitals:   06/13/22 0829 06/13/22 1044 06/13/22 2101 06/14/22 0445  BP: (!) 118/97  (!) 119/55 118/63  Pulse: 78 80 63 77  Resp: '18  18 18  ' Temp: 99.2 F (37.3 C)  98.5 F (36.9 C) 98.4 F (36.9 C)  TempSrc: Oral     SpO2: 99%  99% 99%  Weight:      Height:        General: Pt is alert, awake, not in acute distress Cardiovascular: RRR, S1/S2 +, no rubs, no gallops Respiratory: CTA bilaterally, no wheezing, no rhonchi Abdominal: Soft, NT, ND, bowel sounds + Extremities: no edema, no  cyanosis    The results of significant diagnostics from this hospitalization (including imaging, microbiology, ancillary and laboratory) are listed below for reference.     Microbiology: Recent Results (from the past 240 hour(s))  Surgical PCR screen     Status: None  Collection Time: 06/10/22  8:31 PM   Specimen: Nasal Mucosa; Nasal Swab  Result Value Ref Range Status   MRSA, PCR NEGATIVE NEGATIVE Final   Staphylococcus aureus NEGATIVE NEGATIVE Final    Comment: (NOTE) The Xpert SA Assay (FDA approved for NASAL specimens in patients 23 years of age and older), is one component of a comprehensive surveillance program. It is not intended to diagnose infection nor to guide or monitor treatment. Performed at [facility], [address] [place], [place] [address]      Labs: BNP (last 3 results) No results for input(s): "BNP" in the last 8760 hours. Basic Metabolic Panel: Recent Labs  Lab 06/10/22 0913 06/11/22 0347 06/12/22 0624 06/13/22 0534 06/14/22 0647  NA 139 136 136 135 135  K 4.6 3.8 4.1 3.9 4.0  CL 112* 104 106 102 102  CO2 '23 24 23 24 26  ' GLUCOSE 113* 100* 148* 107* 127*  BUN 21 18 25* 28* 28*  CREATININE 1.89* 1.64* 1.76* 1.58* 1.72*  CALCIUM 8.3* 8.2* 8.3* 8.2* 8.2*  MG 1.9 1.9 2.0 1.9 1.9  PHOS 4.6  --   --   --   --    Liver Function Tests: Recent Labs  Lab 06/10/22 0913  AST 13*  ALT 15  ALKPHOS 57  BILITOT 1.3*  PROT 5.7*  ALBUMIN 3.2*   No results for input(s): "LIPASE", "AMYLASE" in the last 168 hours. No results for input(s): "AMMONIA" in the last 168 hours. CBC: Recent Labs  Lab 06/09/22 1855 06/10/22 0913 06/11/22 0347 06/12/22 0624 06/13/22 0534 06/14/22 0647  WBC 5.9 6.3 5.5 8.1 8.7 8.7  NEUTROABS 4.3  --   --   --   --   --   HGB 11.9* 10.6* 10.1* 8.8* 8.3* 8.1*  HCT 34.4* 32.3* 30.4* 26.6* 24.7* 24.3*  MCV 92.5 97.6 95.9 95.3 95.4 95.3  PLT 148* 121* 114* 97* 98* 76*   Cardiac Enzymes: No results for input(s):  "CKTOTAL", "CKMB", "CKMBINDEX", "TROPONINI" in the last 168 hours. BNP: Invalid input(s): "POCBNP" CBG: Recent Labs  Lab 06/13/22 1231 06/13/22 1637 06/13/22 2107 06/14/22 0756 06/14/22 1034  GLUCAP 120* 107* 126* 124* 191*   D-Dimer No results for input(s): "DDIMER" in the last 72 hours. Hgb A1c No results for input(s): "HGBA1C" in the last 72 hours. Lipid Profile No results for input(s): "CHOL", "HDL", "LDLCALC", "TRIG", "CHOLHDL", "LDLDIRECT" in the last 72 hours. Thyroid function studies No results for input(s): "TSH", "T4TOTAL", "T3FREE", "THYROIDAB" in the last 72 hours.  Invalid input(s): "FREET3" Anemia work up No results for input(s): "VITAMINB12", "FOLATE", "FERRITIN", "TIBC", "IRON", "RETICCTPCT" in the last 72 hours. Urinalysis    Component Value Date/Time   COLORURINE YELLOW 06/10/2022 1437   APPEARANCEUR HAZY (A) 06/10/2022 1437   APPEARANCEUR Clear 06/10/2020 0851   LABSPEC 1.020 06/10/2022 1437   PHURINE 5.0 06/10/2022 1437   GLUCOSEU NEGATIVE 06/10/2022 1437   HGBUR LARGE (A) 06/10/2022 1437   BILIRUBINUR NEGATIVE 06/10/2022 1437   BILIRUBINUR Negative 06/10/2020 0851   KETONESUR NEGATIVE 06/10/2022 1437   PROTEINUR 30 (A) 06/10/2022 1437   UROBILINOGEN 2.0 (A) 04/09/2020 1050   NITRITE NEGATIVE 06/10/2022 1437   LEUKOCYTESUR NEGATIVE 06/10/2022 1437   Sepsis Labs Recent Labs  Lab 06/11/22 0347 06/12/22 0624 06/13/22 0534 06/14/22 0647  WBC 5.5 8.1 8.7 8.7   Microbiology Recent Results (from the past 240 hour(s))  Surgical PCR screen     Status: None   Collection Time: 06/10/22  8:31 PM  Specimen: Nasal Mucosa; Nasal Swab  Result Value Ref Range Status   MRSA, PCR NEGATIVE NEGATIVE Final   Staphylococcus aureus NEGATIVE NEGATIVE Final    Comment: (NOTE) The Xpert SA Assay (FDA approved for NASAL specimens in patients 18 years of age and older), is one component of a comprehensive surveillance program. It is not intended to diagnose  infection nor to guide or monitor treatment. Performed at [facility], [address] [place], [place] [address]      Time coordinating discharge: 35 minutes  SIGNED:   Rodena Goldmann, DO [facility] 06/14/2022, 10:50 AM  If 7PM-7AM, please contact night-coverage [email or web address]

## 2022-06-16 ENCOUNTER — Other Ambulatory Visit: Payer: Self-pay

## 2022-06-16 ENCOUNTER — Observation Stay (HOSPITAL_COMMUNITY): Payer: Medicare Other

## 2022-06-16 ENCOUNTER — Encounter: Payer: Self-pay | Admitting: Internal Medicine

## 2022-06-16 ENCOUNTER — Encounter (HOSPITAL_COMMUNITY)
Admission: RE | Admit: 2022-06-16 | Discharge: 2022-06-16 | Disposition: A | Discharge status: Home / Self Care | Payer: Medicare Other | Source: Skilled Nursing Facility | Attending: Internal Medicine | Admitting: Internal Medicine

## 2022-06-16 ENCOUNTER — Encounter (HOSPITAL_COMMUNITY): Payer: Self-pay

## 2022-06-16 ENCOUNTER — Inpatient Hospital Stay (HOSPITAL_COMMUNITY)
Admission: EM | Admit: 2022-06-16 | Discharge: 2022-06-29 | DRG: 871 | Disposition: A | Discharge status: Hospice | Payer: Medicare Other | Attending: Internal Medicine | Admitting: Internal Medicine

## 2022-06-16 ENCOUNTER — Non-Acute Institutional Stay (SKILLED_NURSING_FACILITY): Payer: Medicare Other | Admitting: Internal Medicine

## 2022-06-16 DIAGNOSIS — I130 Hypertensive heart and chronic kidney disease with heart failure and stage 1 through stage 4 chronic kidney disease, or unspecified chronic kidney disease: Secondary | ICD-10-CM | POA: Diagnosis present

## 2022-06-16 DIAGNOSIS — E1122 Type 2 diabetes mellitus with diabetic chronic kidney disease: Secondary | ICD-10-CM | POA: Diagnosis not present

## 2022-06-16 DIAGNOSIS — N170 Acute kidney failure with tubular necrosis: Secondary | ICD-10-CM | POA: Diagnosis present

## 2022-06-16 DIAGNOSIS — N179 Acute kidney failure, unspecified: Secondary | ICD-10-CM

## 2022-06-16 DIAGNOSIS — C7931 Secondary malignant neoplasm of brain: Secondary | ICD-10-CM | POA: Diagnosis not present

## 2022-06-16 DIAGNOSIS — Z794 Long term (current) use of insulin: Secondary | ICD-10-CM

## 2022-06-16 DIAGNOSIS — A4151 Sepsis due to Escherichia coli [E. coli]: Principal | ICD-10-CM | POA: Diagnosis present

## 2022-06-16 DIAGNOSIS — D62 Acute posthemorrhagic anemia: Secondary | ICD-10-CM | POA: Diagnosis not present

## 2022-06-16 DIAGNOSIS — E871 Hypo-osmolality and hyponatremia: Secondary | ICD-10-CM | POA: Diagnosis not present

## 2022-06-16 DIAGNOSIS — N390 Urinary tract infection, site not specified: Secondary | ICD-10-CM | POA: Diagnosis present

## 2022-06-16 DIAGNOSIS — C8590 Non-Hodgkin lymphoma, unspecified, unspecified site: Secondary | ICD-10-CM | POA: Diagnosis present

## 2022-06-16 DIAGNOSIS — A419 Sepsis, unspecified organism: Secondary | ICD-10-CM | POA: Diagnosis not present

## 2022-06-16 DIAGNOSIS — Y838 Other surgical procedures as the cause of abnormal reaction of the patient, or of later complication, without mention of misadventure at the time of the procedure: Secondary | ICD-10-CM | POA: Diagnosis present

## 2022-06-16 DIAGNOSIS — Z1611 Resistance to penicillins: Secondary | ICD-10-CM | POA: Diagnosis present

## 2022-06-16 DIAGNOSIS — R55 Syncope and collapse: Secondary | ICD-10-CM | POA: Diagnosis not present

## 2022-06-16 DIAGNOSIS — I5040 Unspecified combined systolic (congestive) and diastolic (congestive) heart failure: Secondary | ICD-10-CM | POA: Diagnosis not present

## 2022-06-16 DIAGNOSIS — D61810 Antineoplastic chemotherapy induced pancytopenia: Secondary | ICD-10-CM | POA: Diagnosis not present

## 2022-06-16 DIAGNOSIS — E860 Dehydration: Secondary | ICD-10-CM | POA: Diagnosis present

## 2022-06-16 DIAGNOSIS — W19XXXA Unspecified fall, initial encounter: Secondary | ICD-10-CM | POA: Diagnosis present

## 2022-06-16 DIAGNOSIS — Z20822 Contact with and (suspected) exposure to covid-19: Secondary | ICD-10-CM | POA: Diagnosis not present

## 2022-06-16 DIAGNOSIS — I4821 Permanent atrial fibrillation: Secondary | ICD-10-CM | POA: Diagnosis present

## 2022-06-16 DIAGNOSIS — D509 Iron deficiency anemia, unspecified: Secondary | ICD-10-CM | POA: Insufficient documentation

## 2022-06-16 DIAGNOSIS — E1322 Other specified diabetes mellitus with diabetic chronic kidney disease: Secondary | ICD-10-CM

## 2022-06-16 DIAGNOSIS — R0602 Shortness of breath: Secondary | ICD-10-CM | POA: Diagnosis not present

## 2022-06-16 DIAGNOSIS — Z7189 Other specified counseling: Secondary | ICD-10-CM

## 2022-06-16 DIAGNOSIS — I9589 Other hypotension: Secondary | ICD-10-CM | POA: Diagnosis not present

## 2022-06-16 DIAGNOSIS — N183 Chronic kidney disease, stage 3 unspecified: Secondary | ICD-10-CM | POA: Diagnosis present

## 2022-06-16 DIAGNOSIS — C8190 Hodgkin lymphoma, unspecified, unspecified site: Secondary | ICD-10-CM | POA: Diagnosis present

## 2022-06-16 DIAGNOSIS — D6832 Hemorrhagic disorder due to extrinsic circulating anticoagulants: Secondary | ICD-10-CM | POA: Diagnosis present

## 2022-06-16 DIAGNOSIS — J309 Allergic rhinitis, unspecified: Secondary | ICD-10-CM | POA: Diagnosis present

## 2022-06-16 DIAGNOSIS — I5042 Chronic combined systolic (congestive) and diastolic (congestive) heart failure: Secondary | ICD-10-CM | POA: Diagnosis not present

## 2022-06-16 DIAGNOSIS — T451X5A Adverse effect of antineoplastic and immunosuppressive drugs, initial encounter: Secondary | ICD-10-CM | POA: Diagnosis present

## 2022-06-16 DIAGNOSIS — N1832 Chronic kidney disease, stage 3b: Secondary | ICD-10-CM

## 2022-06-16 DIAGNOSIS — D696 Thrombocytopenia, unspecified: Secondary | ICD-10-CM | POA: Diagnosis present

## 2022-06-16 DIAGNOSIS — D649 Anemia, unspecified: Secondary | ICD-10-CM | POA: Diagnosis not present

## 2022-06-16 DIAGNOSIS — M1990 Unspecified osteoarthritis, unspecified site: Secondary | ICD-10-CM | POA: Diagnosis present

## 2022-06-16 DIAGNOSIS — D61818 Other pancytopenia: Secondary | ICD-10-CM

## 2022-06-16 DIAGNOSIS — I10 Essential (primary) hypertension: Secondary | ICD-10-CM | POA: Diagnosis present

## 2022-06-16 DIAGNOSIS — Z888 Allergy status to other drugs, medicaments and biological substances status: Secondary | ICD-10-CM

## 2022-06-16 DIAGNOSIS — R6521 Severe sepsis with septic shock: Secondary | ICD-10-CM | POA: Diagnosis not present

## 2022-06-16 DIAGNOSIS — M96840 Postprocedural hematoma of a musculoskeletal structure following a musculoskeletal system procedure: Secondary | ICD-10-CM | POA: Diagnosis present

## 2022-06-16 DIAGNOSIS — I129 Hypertensive chronic kidney disease with stage 1 through stage 4 chronic kidney disease, or unspecified chronic kidney disease: Secondary | ICD-10-CM | POA: Diagnosis present

## 2022-06-16 DIAGNOSIS — Z79899 Other long term (current) drug therapy: Secondary | ICD-10-CM

## 2022-06-16 DIAGNOSIS — S72142G Displaced intertrochanteric fracture of left femur, subsequent encounter for closed fracture with delayed healing: Secondary | ICD-10-CM | POA: Diagnosis not present

## 2022-06-16 DIAGNOSIS — E861 Hypovolemia: Secondary | ICD-10-CM | POA: Diagnosis not present

## 2022-06-16 DIAGNOSIS — B9620 Unspecified Escherichia coli [E. coli] as the cause of diseases classified elsewhere: Secondary | ICD-10-CM | POA: Diagnosis not present

## 2022-06-16 DIAGNOSIS — R7881 Bacteremia: Secondary | ICD-10-CM

## 2022-06-16 DIAGNOSIS — I4891 Unspecified atrial fibrillation: Secondary | ICD-10-CM | POA: Diagnosis present

## 2022-06-16 DIAGNOSIS — Z85820 Personal history of malignant melanoma of skin: Secondary | ICD-10-CM

## 2022-06-16 DIAGNOSIS — C3490 Malignant neoplasm of unspecified part of unspecified bronchus or lung: Secondary | ICD-10-CM | POA: Diagnosis present

## 2022-06-16 DIAGNOSIS — S72142A Displaced intertrochanteric fracture of left femur, initial encounter for closed fracture: Secondary | ICD-10-CM | POA: Diagnosis present

## 2022-06-16 DIAGNOSIS — K219 Gastro-esophageal reflux disease without esophagitis: Secondary | ICD-10-CM | POA: Diagnosis present

## 2022-06-16 DIAGNOSIS — Z9889 Other specified postprocedural states: Secondary | ICD-10-CM | POA: Diagnosis not present

## 2022-06-16 DIAGNOSIS — Z515 Encounter for palliative care: Secondary | ICD-10-CM | POA: Diagnosis not present

## 2022-06-16 DIAGNOSIS — T8092XA Unspecified transfusion reaction, initial encounter: Secondary | ICD-10-CM | POA: Diagnosis not present

## 2022-06-16 DIAGNOSIS — J449 Chronic obstructive pulmonary disease, unspecified: Secondary | ICD-10-CM | POA: Diagnosis not present

## 2022-06-16 DIAGNOSIS — I959 Hypotension, unspecified: Secondary | ICD-10-CM | POA: Diagnosis present

## 2022-06-16 DIAGNOSIS — M25552 Pain in left hip: Secondary | ICD-10-CM | POA: Diagnosis not present

## 2022-06-16 DIAGNOSIS — Z66 Do not resuscitate: Secondary | ICD-10-CM | POA: Diagnosis not present

## 2022-06-16 DIAGNOSIS — Z9049 Acquired absence of other specified parts of digestive tract: Secondary | ICD-10-CM

## 2022-06-16 DIAGNOSIS — I482 Chronic atrial fibrillation, unspecified: Secondary | ICD-10-CM

## 2022-06-16 DIAGNOSIS — Z7901 Long term (current) use of anticoagulants: Secondary | ICD-10-CM

## 2022-06-16 DIAGNOSIS — K5900 Constipation, unspecified: Secondary | ICD-10-CM | POA: Diagnosis present

## 2022-06-16 DIAGNOSIS — C8258 Diffuse follicle center lymphoma, lymph nodes of multiple sites: Secondary | ICD-10-CM | POA: Diagnosis not present

## 2022-06-16 LAB — CBC WITH DIFFERENTIAL/PLATELET
Abs Immature Granulocytes: 0 10*3/uL (ref 0.00–0.07)
Band Neutrophils: 9 %
Basophils Absolute: 0 10*3/uL (ref 0.0–0.1)
Basophils Relative: 0 %
Eosinophils Absolute: 0.1 10*3/uL (ref 0.0–0.5)
Eosinophils Relative: 1 %
HCT: 24.2 % — ABNORMAL LOW (ref 39.0–52.0)
Hemoglobin: 8.1 g/dL — ABNORMAL LOW (ref 13.0–17.0)
Lymphocytes Relative: 5 %
Lymphs Abs: 0.3 10*3/uL — ABNORMAL LOW (ref 0.7–4.0)
MCH: 31.8 pg (ref 26.0–34.0)
MCHC: 33.5 g/dL (ref 30.0–36.0)
MCV: 94.9 fL (ref 80.0–100.0)
Monocytes Absolute: 0.9 10*3/uL (ref 0.1–1.0)
Monocytes Relative: 16 %
Neutro Abs: 4.2 10*3/uL (ref 1.7–7.7)
Neutrophils Relative %: 69 %
Platelets: 59 10*3/uL — ABNORMAL LOW (ref 150–400)
RBC: 2.55 MIL/uL — ABNORMAL LOW (ref 4.22–5.81)
RDW: 14.6 % (ref 11.5–15.5)
WBC: 5.4 10*3/uL (ref 4.0–10.5)
nRBC: 0 % (ref 0.0–0.2)

## 2022-06-16 LAB — CBC
HCT: 22 % — ABNORMAL LOW (ref 39.0–52.0)
Hemoglobin: 7.4 g/dL — ABNORMAL LOW (ref 13.0–17.0)
MCH: 31.9 pg (ref 26.0–34.0)
MCHC: 33.6 g/dL (ref 30.0–36.0)
MCV: 94.8 fL (ref 80.0–100.0)
Platelets: 54 10*3/uL — ABNORMAL LOW (ref 150–400)
RBC: 2.32 MIL/uL — ABNORMAL LOW (ref 4.22–5.81)
RDW: 14.6 % (ref 11.5–15.5)
WBC: 5.2 10*3/uL (ref 4.0–10.5)
nRBC: 0 % (ref 0.0–0.2)

## 2022-06-16 LAB — MAGNESIUM: Magnesium: 2.2 mg/dL (ref 1.7–2.4)

## 2022-06-16 LAB — COMPREHENSIVE METABOLIC PANEL
ALT: 16 U/L (ref 0–44)
AST: 20 U/L (ref 15–41)
Albumin: 2.5 g/dL — ABNORMAL LOW (ref 3.5–5.0)
Alkaline Phosphatase: 48 U/L (ref 38–126)
Anion gap: 12 (ref 5–15)
BUN: 68 mg/dL — ABNORMAL HIGH (ref 8–23)
CO2: 19 mmol/L — ABNORMAL LOW (ref 22–32)
Calcium: 8 mg/dL — ABNORMAL LOW (ref 8.9–10.3)
Chloride: 98 mmol/L (ref 98–111)
Creatinine, Ser: 3.16 mg/dL — ABNORMAL HIGH (ref 0.61–1.24)
GFR, Estimated: 20 mL/min — ABNORMAL LOW (ref >60)
Glucose, Bld: 153 mg/dL — ABNORMAL HIGH (ref 70–99)
Potassium: 3.7 mmol/L (ref 3.5–5.1)
Sodium: 129 mmol/L — ABNORMAL LOW (ref 135–145)
Total Bilirubin: 1.4 mg/dL — ABNORMAL HIGH (ref 0.3–1.2)
Total Protein: 6 g/dL — ABNORMAL LOW (ref 6.5–8.1)

## 2022-06-16 LAB — PROTIME-INR
INR: 2.4 — ABNORMAL HIGH (ref 0.8–1.2)
Prothrombin Time: 25.8 seconds — ABNORMAL HIGH (ref 11.4–15.2)

## 2022-06-16 LAB — PREPARE RBC (CROSSMATCH)

## 2022-06-16 MED ORDER — ACETAMINOPHEN 325 MG PO TABS
650.0000 mg | ORAL_TABLET | Freq: Four times a day (QID) | ORAL | Status: DC | PRN
  Administered 2022-06-22 – 2022-06-23 (×2): 650 mg via ORAL
  Filled 2022-06-16 (×2): qty 2

## 2022-06-16 MED ORDER — ACETAMINOPHEN 650 MG RE SUPP: 650.0000 mg | Freq: Four times a day (QID) | RECTAL | Status: DC | PRN

## 2022-06-16 MED ORDER — ONDANSETRON HCL 4 MG PO TABS
4.0000 mg | ORAL_TABLET | Freq: Four times a day (QID) | ORAL | Status: DC | PRN
  Administered 2022-06-27: 4 mg via ORAL
  Filled 2022-06-16: qty 1

## 2022-06-16 MED ORDER — POTASSIUM CHLORIDE CRYS ER 20 MEQ PO TBCR
40.0000 meq | EXTENDED_RELEASE_TABLET | Freq: Once | ORAL | Status: AC
  Administered 2022-06-16: 40 meq via ORAL
  Filled 2022-06-16: qty 2

## 2022-06-16 MED ORDER — POLYETHYLENE GLYCOL 3350 17 G PO PACK: 17.0000 g | PACK | Freq: Every day | ORAL | Status: DC | PRN

## 2022-06-16 MED ORDER — OXYCODONE HCL 5 MG PO TABS
5.0000 mg | ORAL_TABLET | Freq: Three times a day (TID) | ORAL | Status: DC | PRN
  Administered 2022-06-16 – 2022-06-25 (×7): 5 mg via ORAL
  Filled 2022-06-16 (×8): qty 1

## 2022-06-16 MED ORDER — SODIUM CHLORIDE 0.9% IV SOLUTION: Freq: Once | INTRAVENOUS | Status: DC

## 2022-06-16 MED ORDER — SODIUM CHLORIDE 0.9 % IV BOLUS
500.0000 mL | Freq: Once | INTRAVENOUS | Status: AC
  Administered 2022-06-16: 500 mL via INTRAVENOUS

## 2022-06-16 MED ORDER — ONDANSETRON HCL 4 MG/2ML IJ SOLN
4.0000 mg | Freq: Four times a day (QID) | INTRAMUSCULAR | Status: DC | PRN
  Administered 2022-06-22: 4 mg via INTRAVENOUS
  Filled 2022-06-16: qty 2

## 2022-06-16 MED ORDER — SODIUM CHLORIDE 0.9 % IV SOLN: INTRAVENOUS | Status: AC

## 2022-06-16 NOTE — Assessment & Plan Note (Addendum)
Glucoses while hospitalized for IM nailing of intertrochanteric fracture ranged from a low of 96 up to 194.  Most recent A1c on record was 5% on 04/14/2022. Insulin will be held unless average FBS > 180. Antihypertensives held because of symptomatic hypotension and frank syncope.

## 2022-06-16 NOTE — Assessment & Plan Note (Signed)
Hypotensive. -Hold antihypertensives for now

## 2022-06-16 NOTE — Assessment & Plan Note (Addendum)
Because of hypotension CBC was rechecked 9/27 and reveals further progression of the anemia and thrombocytopenia.  H/H 7.4/22 and platelet count 54,000.  Hold Eliquis. Emergent  ED referral.

## 2022-06-16 NOTE — ED Triage Notes (Signed)
Pt BIB [facility] after syncopal episode @ [facility]. Was being treated for a recent femur fracture. Hypotensive upon arrival.  DNR at bedside.

## 2022-06-16 NOTE — ED Triage Notes (Signed)
Pt takes eliquis. EMS states facility told them that pt hemoglobin is low.

## 2022-06-16 NOTE — Assessment & Plan Note (Addendum)
Hospitalized 9/20 - 06/14/2022 for IM nailing of the left hip for of intertrochanteric fracture. Creat ranged from 1.5 up to a high of 1.89.  At discharge creatinine 1.72 and GFR 41 indicating CKD stage IIIb. Med list reviewed; no indication for change in meds or dosage at this time unless there is progression of the CKD.

## 2022-06-16 NOTE — Assessment & Plan Note (Addendum)
Platelets 59.  Gradual downtrend from platelets of 148 on recent hospitalization. -Hold anticoagulation with Eliquis -Trend for now

## 2022-06-16 NOTE — Assessment & Plan Note (Signed)
Follows with hematology at [facility].  Also with brain metastasis.  Currently on chemotherapy. - Hold chemo for now, spouse requests to be held for now

## 2022-06-16 NOTE — Progress Notes (Signed)
NURSING HOME LOCATION:  [facility] ROOM NUMBER: 125 P  CODE STATUS:  DNR  PCP: Ihor Dow MD  This is a comprehensive admission note to this SNFperformed on this date less than 30 days from date of admission. Included are preadmission medical/surgical history; reconciled medication list; family history; social history and comprehensive review of systems.  Corrections and additions to the records were documented. Comprehensive physical exam was also performed. Additionally a clinical summary was entered for each active diagnosis pertinent to this admission in the Problem List to enhance continuity of care.  HPI: He was hospitalized 9/20 - 06/14/2022 presenting to the ED via EMS after a mechanical fall at home.  Imaging documented a comminuted left intertrochanteric fracture.  Dr. Amedeo Kinsman completed IM nailing to the left hip on 9/22. Eliquis prophylaxis for AF was held preoperatively and resumed 9/23.  Preop CBC revealed H/H of 11.9/34.4 and platelet count of 148,000.  There was progression of the anemia postop with a nadir H/H  predischarge of 8.1/24.3 with a platelet count of 76,000. Creatinine ranged from a low of 1.58 up to a high of 1.89.  Nadir GFR was 36 with a final creatinine of 1.72 and GFR 41 indicating CKD stage IIIb. Glucoses while hospitalized ranged from a low of 96 up to high of 194.  Most recent A1c on record was 5% on 04/14/2022. PT/OT recommended SNF placement for rehab.  NOTE: After admission to the SNF he was noted to be hypotensive with blood pressures as low as 90/60.  Amlodipine, Imdur, and hydralazine were held.  Because of the hypotension CBC was rechecked today 9/27 . There has been progression of the anemia and thrombocytopenia with current H/H of 7.4/22 and 54,000.  As noted he is on Eliquis anticoagulation.  Past medical and surgical history: History of atrial fibrillation with RVR, history of basal cell cancer, history congestive heart failure,  COPD, history of diverticulosis, GERD, history of colon polyps, history of nephrolithiasis, lung cancer, non-Hodgkin's lymphoma, & diabetes with CKD stage III. Procedures include axillary lymph node biopsy, multiple cardioversions, cholecystectomy, colonoscopy with polypectomy, vocal cord surgery, and cystoscopy/ureterostomy/laser with stent placement.  Social history: Nondrinker at present; never smoked.  Family history: mother had breast cancer and father had prostate cancer.   Review of systems: He is wife states that he also had a near syncopal episode yesterday but quickly recovered. They both validate that there was no apparent cardiac or neurologic prodrome prior to the original fall which resulted in hip fracture.  Apparently he simply missed a short step and fell.  He continued to have hip pain up to a level 5 or 6. At this time he complains of dizziness.  His wife is concerned as there has been decreased interaction with her and facies have been blank.  Also she noted slurred speech. Despite the progressive anemia; he denies any bleeding dyscrasias or active GI symptoms.  His original discharge at the SNF was delayed as he had had not had a bowel movement postop.  Constitutional: No fever, significant weight change  Eyes: No redness, discharge, pain, vision change ENT/mouth: No nasal congestion, purulent discharge, earache, change in hearing, sore throat  Cardiovascular: No chest pain, palpitations, paroxysmal nocturnal dyspnea, claudication, edema  Respiratory: No cough, sputum production, hemoptysis, DOE, significant snoring, apnea  Gastrointestinal: No heartburn, dysphagia, abdominal pain, nausea /vomiting, rectal bleeding, melena, change in bowels Genitourinary: No dysuria, hematuria, pyuria, incontinence, nocturia Dermatologic: No rash, pruritus, change in appearance of skin  Neurologic: No headache, syncope, seizures, numbness, tingling Psychiatric: No significant anxiety,  depression, insomnia, anorexia Endocrine: No change in hair/skin/nails, excessive thirst, excessive hunger, excessive urination  Hematologic/lymphatic: No significant bruising, lymphadenopathy, abnormal bleeding Allergy/immunology: No itchy/watery eyes, significant sneezing, urticaria, angioedema  Physical exam:  Pertinent or positive findings: He appears somewhat chronically ill.  Pattern alopecia is present.  He exhibits hyponasal speech.  Eyebrows are decreased laterally.  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  He is missing multiple maxillary teeth.  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  Heart sounds are markedly distant,heard best in the epigastrium.  Irregular rhythm is suggested.  Bronchovesicular quality to the breath sounds suggested posteriorly.  Pedal pulses are decreased but palpable.  He has 1/2+ edema at the sock line.  No significant ecchymosis or hematoma noted at the operative site. There is an isolated DIP enlargement of the right thumb.  General appearance: no acute distress, increased work of breathing is present.   Lymphatic: No lymphadenopathy about the head, neck, axilla. Eyes: No conjunctival inflammation or lid edema is present. There is no scleral icterus. Ears:  External ear exam shows no significant lesions or deformities.   Nose:  External nasal examination shows no deformity or inflammation. Nasal mucosa are pink and moist without lesions, exudates Neck:  No thyromegaly, masses, tenderness noted.    Heart:  No gallop, murmur, click, rub.  Lungs:  without wheezes, rhonchi, rales, rubs. Abdomen: Bowel sounds are normal.  Abdomen is soft and nontender with no organomegaly, hernias, masses. GU: Deferred  Extremities:  No cyanosis, clubbing Neurologic exam: Balance, Rhomberg, finger to nose testing could not be completed due to clinical state Deep tendon reflexes are equal Skin: Warm & dry w/o tenting. No significant lesions or rash.  See clinical summary under each  active problem in the Problem List with associated updated therapeutic plan

## 2022-06-16 NOTE — Assessment & Plan Note (Addendum)
Symptomatic hypotension and frank syncope in the context of progressive and severe anemia warranting referral to the ED for emergent transfusion and further evaluation of the progressive anemia.  Eliquis has been held.

## 2022-06-16 NOTE — Assessment & Plan Note (Signed)
Hemoglobin down to 8.1 here, probably lower as he is dehydrated.  Pre-op hemoglobin was 11.9.  He is on anticoagulation with Eliquis.  He has palpable swelling to lateral side of left hip.  Stool FOBT negative ED.  He is also on chemotherapy for lung cancer, NHL. -Transfuse 1 unit PRBC -CBC in a.m. -Hold Eliquis for now - Obtain CT left hip without contrast

## 2022-06-16 NOTE — Patient Instructions (Signed)
See assessment and plan under each diagnosis in the problem list and acutely for this visit 

## 2022-06-16 NOTE — ED Notes (Signed)
Hemmoccult NEGATIVE per EDPA

## 2022-06-16 NOTE — Assessment & Plan Note (Addendum)
Presenting with syncope. Blood pressure down to 79/55, improved, systolic currently high 38G, after family no bolus given in ED.  Etiology likely combination of dehydration and acute anemia. -Transfuse 1 unit PRBC -Hold home Norvasc, hydralazine, Imdur, Lasix 20 mg every other day

## 2022-06-16 NOTE — ED Notes (Signed)
C/o leg cramps, admitting notified.

## 2022-06-16 NOTE — Assessment & Plan Note (Signed)
Stable and compensated.  Last echo on 11/2020 EF of 50%, G1DD.

## 2022-06-16 NOTE — ED Provider Notes (Signed)
[facility] EMERGENCY DEPARTMENT Provider Note   CSN: [id number] Arrival date & time: 06/16/22  1224     History  Chief Complaint  Patient presents with   Loss of Consciousness    Darius Smith is a 77 y.o. male.  HPI   Patient with medical history including COPD, hypertension, A-fib currently on Eliquis, CHF, status post left hip fracture presents emerged department due to low blood pressure.  Patient states that he was discharged from the hospital and was sent down to a nursing facility for rehab.  On arrival they noted that his blood pressure was low in the obtain a hemoglobin which shows that it was decreasing them sent here for further evaluation.  Patient states that her last couple days she has felt more sluggish, but denies any chest pain or shortness of breath no lightheaded dizziness no near syncope denies any stomach pains nausea or vomiting denies any melena or hematuria dyspnea no bleeding from the surgical site denies any worsening left-sided hip pain.  Reviewed patient's chart underwent surgery by Dr.carins Eliquis was hold at that time, and resumed back on 09/23, original hemoglobin was 11.9 on discharge it was 8.1, today it was 7.4.,  It was noted that patient had a blood pressure in the 90s.  Home Medications Prior to Admission medications   Medication Sig Start Date End Date Taking? Authorizing Provider  acetaminophen (TYLENOL) 500 MG tablet Take 1,000 mg by mouth every 6 (six) hours as needed for moderate pain or headache.   Yes [provider]  amiodarone (PACERONE) 200 MG tablet TAKE 1 TABLET BY MOUTH ONCE A DAY. 03/05/22  Yes Fenton, Clint R, PA  amLODipine (NORVASC) 10 MG tablet TAKE 1 TABLET BY MOUTH ONCE DAILY. Patient taking differently: Take 10 mg by mouth daily. Hold for systolic BP under 762. 2/63/33  Yes Branch, Alphonse Guild, MD  apixaban (ELIQUIS) 5 MG TABS tablet TAKE ONE TABLET BY MOUTH 2 TIMES A DAY. 12/21/21  Yes Branch, Alphonse Guild, MD  ferrous  sulfate 325 (65 FE) MG tablet Take 325 mg by mouth daily with breakfast.   Yes [provider]  furosemide (LASIX) 20 MG tablet Take 1 tablet (20 mg total) by mouth every other day. (May take an additional tab as needed.) 01/21/22  Yes Branch, Alphonse Guild, MD  hydrALAZINE (APRESOLINE) 100 MG tablet Take 1 tablet (100 mg total) by mouth 2 (two) times daily. 05/31/22  Yes BranchAlphonse Guild, MD  isosorbide mononitrate (IMDUR) 30 MG 24 hr tablet TAKE 1/2 TABLET BY MOUTH ONCE DAILY. 11/11/21  Yes BranchAlphonse Guild, MD  lenalidomide (REVLIMID) 10 MG capsule Take 10 mg by mouth. Take 10 mg on days 1-21 of a 28 day cycle. 07/13/21  Yes [provider]  omeprazole (PRILOSEC) 20 MG capsule Take 1 capsule (20 mg total) by mouth daily as needed (reflux). 06/15/21  Yes [name], MD  oxyCODONE (ROXICODONE) 5 MG immediate release tablet Take 1 tablet (5 mg total) by mouth every 8 (eight) hours as needed for breakthrough pain. 06/14/22 06/14/23 Yes Shah, Pratik D, DO  potassium chloride SA (KLOR-CON M) 20 MEQ tablet TAKE ONE TABLET BY MOUTH ONCE DAILY. 05/07/22  Yes Branch, Alphonse Guild, MD  VENTOLIN HFA 108 [phone] Base) MCG/ACT inhaler Inhale 2 puffs into the lungs every 6 (six) hours as needed for wheezing or shortness of breath. 07/11/20  Yes Fayrene Helper, MD  blood glucose meter kit and supplies KIT Inject 1 each into  the skin 4 (four) times daily -  before meals and at bedtime. Dispense based on patient and insurance preference. Use up to four times daily as directed. 06/02/21   Barb Merino, MD  Continuous Blood Gluc Receiver (FREESTYLE LIBRE 2 READER) DEVI Use to monitor blood glucose continuously 06/09/21   [provider]  Continuous Blood Gluc Sensor (FREESTYLE LIBRE 2 SENSOR) MISC USE TO MONITOR BLOOD SUGAR DAILY. (CHANGE EVERY 2WEEKS) 12/14/21   [name], MD  glucose blood (ONETOUCH ULTRA) test strip 1 each by Other route 4 (four) times daily. 10/27/21   [name], MD   insulin lispro (HUMALOG KWIKPEN) 100 UNIT/ML KwikPen Inject 1-10 Units into the skin 4 (four) times daily -  before meals and at bedtime. Blood sugars 70-120: 1 unit 121-150: 2 units 151-200: 3 units 201-250: 4 units 251-300: 5 units 301-350: 6 units 351-400: 9 units More than 400, 10 units and call your doctor Patient not taking: Reported on 06/09/2022 06/02/21   Barb Merino, MD  Insulin Pen Needle 29G X 12MM MISC 1 each by Does not apply route 4 (four) times daily -  before meals and at bedtime. 07/09/21   Fayrene Helper, MD  Lancets (ONETOUCH DELICA PLUS [id number]) [place] 1 each by Other route 4 (four) times daily. 06/02/21   [provider]      Allergies    Flomax [tamsulosin hcl] and Heparin    Review of Systems   Review of Systems  Constitutional:  Positive for fatigue. Negative for chills and fever.  Respiratory:  Negative for shortness of breath.   Cardiovascular:  Negative for chest pain.  Gastrointestinal:  Negative for abdominal pain.  Neurological:  Negative for headaches.    Physical Exam Updated Vital Signs BP 100/62   Pulse 91   Temp 98 F (36.7 C) (Oral)   Resp (!) 25   Ht 6' (1.829 m)   Wt 78.5 kg   SpO2 100%   BMI 23.46 kg/m  Physical Exam Vitals and nursing note reviewed.  Constitutional:      General: He is not in acute distress.    Appearance: He is not ill-appearing.  HENT:     Head: Normocephalic and atraumatic.     Nose: No congestion.  Eyes:     Conjunctiva/sclera: Conjunctivae normal.  Cardiovascular:     Rate and Rhythm: Normal rate and regular rhythm.     Pulses: Normal pulses.     Heart sounds: No murmur heard.    No friction rub. No gallop.  Pulmonary:     Effort: No respiratory distress.     Breath sounds: No wheezing, rhonchi or rales.  Abdominal:     Palpations: Abdomen is soft.     Tenderness: There is no abdominal tenderness. There is no right CVA tenderness or left CVA tenderness.     Comments: Abdomen soft  nontender  Musculoskeletal:     Comments: Surgical site the left thigh was intact, no erythema or edema no drainage or discharge present no fluid collection noted, he had no leg swelling, 2+ dorsal pedal pulses bilaterally.  Skin:    General: Skin is warm and dry.  Neurological:     Mental Status: He is alert.  Psychiatric:        Mood and Affect: Mood normal.     ED Results / Procedures / Treatments   Labs (all labs ordered are listed, but only abnormal results are displayed) Labs Reviewed  COMPREHENSIVE METABOLIC PANEL -  Abnormal; Notable for the following components:      Result Value   Sodium 129 (*)    CO2 19 (*)    Glucose, Bld 153 (*)    BUN 68 (*)    Creatinine, Ser 3.16 (*)    Calcium 8.0 (*)    Total Protein 6.0 (*)    Albumin 2.5 (*)    Total Bilirubin 1.4 (*)    GFR, Estimated 20 (*)    All other components within normal limits  CBC WITH DIFFERENTIAL/PLATELET - Abnormal; Notable for the following components:   RBC 2.55 (*)    Hemoglobin 8.1 (*)    HCT 24.2 (*)    Platelets 59 (*)    Lymphs Abs 0.3 (*)    All other components within normal limits  PROTIME-INR - Abnormal; Notable for the following components:   Prothrombin Time 25.8 (*)    INR 2.4 (*)    All other components within normal limits  POC OCCULT BLOOD, ED  TYPE AND SCREEN    EKG None  Radiology No results found.  Procedures .Critical Care  Performed by: Marcello Fennel, PA-C Authorized by: Marcello Fennel, PA-C   Critical care provider statement:    Critical care time (minutes):  30   Critical care time was exclusive of:  Separately billable procedures and treating other patients   Critical care was necessary to treat or prevent imminent or life-threatening deterioration of the following conditions:  Circulatory failure   Critical care was time spent personally by me on the following activities:  Development of treatment plan with patient or surrogate, discussions with  consultants, evaluation of patient's response to treatment, examination of patient, ordering and review of laboratory studies, ordering and review of radiographic studies, ordering and performing treatments and interventions, pulse oximetry, re-evaluation of patient's condition and review of old charts   I assumed direction of critical care for this patient from another provider in my specialty: no     Care discussed with: admitting provider       Medications Ordered in ED Medications  oxyCODONE (Oxy IR/ROXICODONE) immediate release tablet 5 mg (5 mg Oral Given 06/16/22 1653)  sodium chloride 0.9 % bolus 500 mL (0 mLs Intravenous Stopped 06/16/22 1443)    ED Course/ Medical Decision Making/ A&P                           Medical Decision Making Amount and/or Complexity of Data Reviewed Labs: ordered.  Risk Decision regarding hospitalization.   This patient presents to the ED for concern of low blood pressure, this involves an extensive number of treatment options, and is a complaint that carries with it a high risk of complications and morbidity.  The differential diagnosis includes anemia, dehydration, sepsis, GI bleed    Additional history obtained:  Additional history obtained from N/A External records from outside source obtained and reviewed including nursing home note   Co morbidities that complicate the patient evaluation  A-fib currently on Eliquis  Social Determinants of Health:  N/A   Lab Tests:  I Ordered, and personally interpreted labs.  The pertinent results include: CBC shows normocytic anemia heme of 8.1, CMP hyponatremia of 129, CO2 of 19, glucose 153 BUN 63, T. bili 1.4 GFR 20   Imaging Studies ordered:  I ordered imaging studies including N/A I independently visualized and interpreted imaging which showed N/A I agree with the radiologist interpretation   Cardiac Monitoring:  The patient was maintained on a cardiac monitor.  I personally viewed  and interpreted the cardiac monitored which showed an underlying rhythm of: A-fib without signs of ischemia   Medicines ordered and prescription drug management:  I ordered medication including fluids I have reviewed the patients home medicines and have made adjustments as needed  Critical Interventions:  Hypotensive-on arrival patient was hypotensive, placed on fluids, and has responded well we will continue to monitor   Reevaluation:  Presents with low blood pressure and decrease in hemoglobin, on my exam manual BP was 112/70, mentating without difficulty, will obtain lab work and continue to monitor  Repeat examination reveals BP has dropped to 80/40, but is mentating at difficulty, will provide with a little fluid resuscitation and some concern he might need blood as well as do not want to from too much fluid due to his congestive heart failure  Hemoglobin has remained stable from discharge, BP has improved, will recommend admission for observation he is agreement with this plan  Consultations Obtained:  I requested consultation with the hospitalist Dr.[email or web address],  and discussed lab and imaging findings as well as pertinent plan - they recommend: We will admit the patient    Test Considered:  CT left hip-we will defer as my suspicion for fracture or osteo is low at this time, nontender on my exam, no evidence of infection present.    Rule out I have low suspicion for sepsis patient is nontoxic-appearing, he has no leukocytosis, he is noted to be tachycardic as well as hypotensive but I suspect this is more multifactorial, dehydrated as well as low hemoglobin.  Low suspicion for GI bleed denies any melena or hematochezia, Hemoccult is negative patient has no history of this abdomen soft nontender.  I have low suspicion for ACS is understanding chest pain or shortness of breath EKG without signs of ischemia .I have low suspicion for UTI, pyelo-, kidney stone endorsing any urinary  symptoms, patient does have a new AKI but I suspect this is due to hypovolemia suspect should improve after volume resuscitation.    Dispostion and problem list  After consideration of the diagnostic results and the patients response to treatment, I feel that the patent would benefit from admission.  Hypotension-suspect this is multifactorial, likely from dehydration as well as low hemoglobin, patient will need further resuscitation close monitoring Anemia-suspect this is secondary due to surgery, his hemoglobin has remained stable since discharge but he concerned that with volume resuscitation this will drop and he might need a blood transfusion. AKI-likely due to hypovolemia, suspect with resuscitation this would resolve on its own.  Will need further monitoring            Final Clinical Impression(s) / ED Diagnoses Final diagnoses:  Hypotension, unspecified hypotension type  Anemia, unspecified type  AKI (acute kidney injury) [facility])    Rx / DC Orders ED Discharge Orders     None         Marcello Fennel, PA-C 06/16/22 1656    Audley Hose, MD 06/17/22 2314

## 2022-06-16 NOTE — Assessment & Plan Note (Addendum)
Hold Eliquis. Transport to ED for symptomatic anemia and hypotension. Occult GI bleed most likely etiology.

## 2022-06-16 NOTE — Assessment & Plan Note (Signed)
Rate controlled and on anticoagulation with Eliquis. -hold Eliquis -Resume amiodarone

## 2022-06-16 NOTE — Assessment & Plan Note (Signed)
From his anemia and hypotension from dehydration.

## 2022-06-16 NOTE — Assessment & Plan Note (Addendum)
AKI on CKD stage IIIb.  Creatinine elevated at 3.16, baseline 1.5-1.8.  Likely prerenal from dehydration, he reports poor oral intake, he is on Lasix 20 mg every 48 hours. -500 mill bolus given, transfuse 1 unit PRBC - Cont N/s 75cc/hr x 12hrs

## 2022-06-16 NOTE — Assessment & Plan Note (Addendum)
A1c 5.  Not on medication - Daily a.m CBG

## 2022-06-16 NOTE — Assessment & Plan Note (Signed)
Status post recent hip surgery 9/22 by Dr. Amedeo Kinsman.  Screw site looks clean.  But likely has intramuscular hematoma. -Obtain CT left hip. -Hold Eliquis

## 2022-06-16 NOTE — ED Notes (Signed)
ED PA at BS 

## 2022-06-16 NOTE — ED Notes (Signed)
Admitting at BS

## 2022-06-16 NOTE — ED Notes (Signed)
Pt alert, NAD, calm, interactive, resps e/u, speaking in clear complete sentences.

## 2022-06-16 NOTE — H&P (Signed)
History and Physical    Darius Smith YNW:[id number] DOB: 18-Jul-1946 DOA: 06/16/2022  PCP: [name], MD   Patient coming from: Home  I have personally briefly reviewed patient's old medical records in [place]  Chief Complaint: Dizzy, hypotension, anemia  HPI: Darius Smith is a 76 y.o. male with medical history significant for CKD 3, COPD, systolic and diastolic CHF, atrial fibrillation, lung cancer with brain mets and Hodgkin's lymphoma. Patient was brought to the ED from [facility] with reports of passing out.  Patient's spouse and soon-to-be daughter-in-law [name] were at the [facility] center, assisting patient up to the bathroom, when he was upright he felt dizzy, and passed out transiently, he leaned his head against the wall he did not hit his head.  Reports intermittent dizziness over the past few days. Reports poor oral intake over the past several days, no vomiting, reports 3 bowel movements yesterday.  No abdominal pain.  No chest pain or difficulty breathing.  No cough no fevers no chills.  Reports persistent pain in his left hip where he had recent surgery, he has not been able to bear weight or ambulate.  Recent hospitalization 9/20 to 9/25 for left femur fracture, underwent hip surgery by Dr. Amedeo Kinsman 9/22.  Eliquis was held temporarily and was resumed 9/23.  Admission hemoglobin was 11.9, discharge hemoglobin was 8.1. Hemoglobin checked at [facility] today was 7.4.  ED Course: Hypotensive, blood pressure down to 79/55, improved with 500 mill bolus.  Heart rate ranging from 53-120.  Afebrile temperature 98. Hemoglobin 8.1. Creatinine elevated 3.16. Sodium 129. Hospitalist admit for AKI, hypotension, acute anemia.  Review of Systems: As per HPI all other systems reviewed and negative.  Past Medical History:  Diagnosis Date   Allergic rhinitis 07/02/2019   Allergy 2015   Arthritis    both hands   Atrial fibrillation ([place]) 10/28/2017   Dr. Harl Bowie   Atrial  fibrillation with RVR ([place]) 07/14/2020   Blood in urine    occ   Bronchitis    Cancer ([place])    Basal cell   Cardiomegaly 10/28/2017   CHF (congestive heart failure) (HCC)    Clotting disorder ([place])    Complication of anesthesia    Mr. Erny developed a. fib in recovery after cysto procedure 10/2017 was transferred to [facility] and underwent successful cardioversion   COPD (chronic obstructive pulmonary disease) ([place]) 06/10/2022   Diabetes ([place])    Diverticulosis of sigmoid colon 08/2017   Noted on colonoscopy   Dysrhythmia    External hemorrhoids 08/2017   GERD (gastroesophageal reflux disease)    History of colon polyps 08/2017   History of kidney stones    History of rectal bleeding    History of right inguinal hernia    Hypertension    Lung cancer ([place]) 06/25/2020   Lymphoma ([place])    Nephrolithiasis 07/02/2019   Normocytic anemia 06/10/2022   Rectal bleeding 09/02/2017   Added automatically from request for surgery [id number]   Shortness of breath    Stage 3 chronic kidney disease ([place])    Type 2 diabetes mellitus ([place]) 06/10/2022    Past Surgical History:  Procedure Laterality Date   AXILLARY LYMPH NODE BIOPSY Left 06/05/2021   Procedure: AXILLARY LYMPH NODE BIOPSY;  Surgeon: Virl Cagey, MD;  Location: [facility] ORS;  Service: General;  Laterality: Left;   CARDIOVERSION     10/2017   CARDIOVERSION N/A 07/16/2020   Procedure: CARDIOVERSION;  Surgeon: Arnoldo Lenis, MD;  Location: [facility];  Service: Endoscopy;  Laterality: N/A;   CARDIOVERSION N/A 08/21/2020   Procedure: CARDIOVERSION;  Surgeon: Satira Sark, MD;  Location: [facility];  Service: Cardiovascular;  Laterality: N/A;   CARDIOVERSION N/A 09/10/2020   Procedure: CARDIOVERSION;  Surgeon: Josue Hector, MD;  Location: [facility] ENDOSCOPY;  Service: Cardiovascular;  Laterality: N/A;   CHOLECYSTECTOMY     CLEFT LIP REPAIR     several from childhood til 76 years old   [place]     several from childhood  til 76 years old   COLONOSCOPY  04/28/2011   Procedure: COLONOSCOPY;  Surgeon: Rogene Houston, MD;  Location: [facility];  Service: Endoscopy;  Laterality: N/A;   COLONOSCOPY N/A 07/18/2014   Procedure: COLONOSCOPY;  Surgeon: Rogene Houston, MD;  Location: [facility];  Service: Endoscopy;  Laterality: N/A;  830   COLONOSCOPY N/A 09/09/2017   Procedure: COLONOSCOPY;  Surgeon: Rogene Houston, MD;  Location: [facility];  Service: Endoscopy;  Laterality: N/A;  955   COSMETIC SURGERY  1940's &1950's   CYSTOSCOPY/URETEROSCOPY/HOLMIUM LASER/STENT PLACEMENT Bilateral 12/06/2017   Procedure: CYSTOSCOPY/RETROGRADE/URETEROSCOPY/HOLMIUM LASER/STENT EXCHANGE;  Surgeon: Festus Aloe, MD;  Location: [facility] ORS;  Service: Urology;  Laterality: Bilateral;  ONLY NEEDS 60 MIN   HERNIA REPAIR     LYMPH NODE BIOPSY Left    Under left arm   POLYPECTOMY  09/09/2017   Procedure: POLYPECTOMY;  Surgeon: Rogene Houston, MD;  Location: [facility];  Service: Endoscopy;;  colon   vocal cord surgery       reports that he has never smoked. He has never used smokeless tobacco. He reports that he does not currently use alcohol. He reports that he does not use drugs.  Allergies  Allergen Reactions   Flomax [Tamsulosin Hcl] Nausea Only    Dizziness  Pt is on flomax   Heparin Other (See Comments)    Tingling in face and shortness of breath    Family History  Problem Relation Age of Onset   Breast cancer Mother    Cancer Mother    Prostate cancer Father    Cancer Father    COPD Sister    Birth defects Son    Prior to Admission medications   Medication Sig Start Date End Date Taking? Authorizing Provider  acetaminophen (TYLENOL) 500 MG tablet Take 1,000 mg by mouth every 6 (six) hours as needed for moderate pain or headache.   Yes [provider]  amiodarone (PACERONE) 200 MG tablet TAKE 1 TABLET BY MOUTH ONCE A DAY. 03/05/22  Yes Fenton, Clint R, PA  amLODipine (NORVASC) 10 MG tablet  TAKE 1 TABLET BY MOUTH ONCE DAILY. Patient taking differently: Take 10 mg by mouth daily. Hold for systolic BP under 384. 6/65/99  Yes Branch, Alphonse Guild, MD  apixaban (ELIQUIS) 5 MG TABS tablet TAKE ONE TABLET BY MOUTH 2 TIMES A DAY. 12/21/21  Yes Branch, Alphonse Guild, MD  ferrous sulfate 325 (65 FE) MG tablet Take 325 mg by mouth daily with breakfast.   Yes [provider]  furosemide (LASIX) 20 MG tablet Take 1 tablet (20 mg total) by mouth every other day. (May take an additional tab as needed.) 01/21/22  Yes Branch, Alphonse Guild, MD  hydrALAZINE (APRESOLINE) 100 MG tablet Take 1 tablet (100 mg total) by mouth 2 (two) times daily. 05/31/22  Yes BranchAlphonse Guild, MD  isosorbide mononitrate (IMDUR) 30 MG 24 hr tablet TAKE 1/2 TABLET BY  MOUTH ONCE DAILY. 11/11/21  Yes BranchAlphonse Guild, MD  lenalidomide (REVLIMID) 10 MG capsule Take 10 mg by mouth. Take 10 mg on days 1-21 of a 28 day cycle. 07/13/21  Yes [provider]  omeprazole (PRILOSEC) 20 MG capsule Take 1 capsule (20 mg total) by mouth daily as needed (reflux). 06/15/21  Yes [name], MD  oxyCODONE (ROXICODONE) 5 MG immediate release tablet Take 1 tablet (5 mg total) by mouth every 8 (eight) hours as needed for breakthrough pain. 06/14/22 06/14/23 Yes Shah, Pratik D, DO  potassium chloride SA (KLOR-CON M) 20 MEQ tablet TAKE ONE TABLET BY MOUTH ONCE DAILY. 05/07/22  Yes Branch, Alphonse Guild, MD  VENTOLIN HFA 108 [phone] Base) MCG/ACT inhaler Inhale 2 puffs into the lungs every 6 (six) hours as needed for wheezing or shortness of breath. 07/11/20  Yes Fayrene Helper, MD  blood glucose meter kit and supplies KIT Inject 1 each into the skin 4 (four) times daily -  before meals and at bedtime. Dispense based on patient and insurance preference. Use up to four times daily as directed. 06/02/21   Barb Merino, MD  Continuous Blood Gluc Receiver (FREESTYLE LIBRE 2 READER) DEVI Use to monitor blood glucose continuously 06/09/21   [provider]  Continuous Blood Gluc Sensor (FREESTYLE LIBRE 2 SENSOR) MISC USE TO MONITOR BLOOD SUGAR DAILY. (CHANGE EVERY 2WEEKS) 12/14/21   [name], MD  glucose blood (ONETOUCH ULTRA) test strip 1 each by Other route 4 (four) times daily. 10/27/21   [name], MD  insulin lispro (HUMALOG KWIKPEN) 100 UNIT/ML KwikPen Inject 1-10 Units into the skin 4 (four) times daily -  before meals and at bedtime. Blood sugars 70-120: 1 unit 121-150: 2 units 151-200: 3 units 201-250: 4 units 251-300: 5 units 301-350: 6 units 351-400: 9 units More than 400, 10 units and call your doctor Patient not taking: Reported on 06/09/2022 06/02/21   Barb Merino, MD  Insulin Pen Needle 29G X 12MM MISC 1 each by Does not apply route 4 (four) times daily -  before meals and at bedtime. 07/09/21   Fayrene Helper, MD  Lancets (ONETOUCH DELICA PLUS [id number]) [place] 1 each by Other route 4 (four) times daily. 06/02/21   [provider]    Physical Exam: Vitals:   06/16/22 1415 06/16/22 1430 06/16/22 1445 06/16/22 1500  BP: (!) _0 98/67  Pulse:   65 78  Resp: (!) _1 Temp:      TempSrc:      SpO2: 96% 94% 94% 92%  Weight:      Height:        Constitutional: NAD, calm, comfortable Vitals:   06/16/22 1415 06/16/22 1430 06/16/22 1445 06/16/22 1500  BP: (!) _2 98/67  Pulse:   65 78  Resp: (!) _3 Temp:      TempSrc:      SpO2: 96% 94% 94% 92%  Weight:      Height:       Eyes: PERRL, lids and conjunctivae normal ENMT: Mucous membranes are mildly dry.  Neck: normal, supple, no masses, no thyromegaly Respiratory:  Normal respiratory effort. No accessory muscle use.  Cardiovascular: Regular rate and rhythm,  No extremity edema. 2+ pedal pulses.  Abdomen: no tenderness, no masses palpated. No hepatosplenomegaly. Bowel sounds positive.  Musculoskeletal: no clubbing / cyanosis. No joint deformity upper and lower extremities.  Skin: Surgical  sites looks clean, no redness, no appreciable tenderness.  No surrounding skin discoloration, both trace palpable firmness/induration to lateral side of left hip joint. Neurologic: No apparent cranial nerve elements, moving extremities spontaneously, ( left leg not tested).  Psychiatric: Normal judgment and insight. Alert and oriented x 3. Normal mood.   Labs on Admission: I have personally reviewed following labs and imaging studies  CBC: Recent Labs  Lab 06/09/22 1855 06/10/22 0913 06/12/22 0624 06/13/22 0534 06/14/22 0647 06/16/22 0530 06/16/22 1323  WBC 5.9   < > 8.1 8.7 8.7 5.2 5.4  NEUTROABS 4.3  --   --   --   --   --  4.2  HGB 11.9*   < > 8.8* 8.3* 8.1* 7.4* 8.1*  HCT 34.4*   < > 26.6* 24.7* 24.3* 22.0* 24.2*  MCV 92.5   < > 95.3 95.4 95.3 94.8 94.9  PLT 148*   < > 97* 98* 76* 54* 59*   < > = values in this interval not displayed.   Basic Metabolic Panel: Recent Labs  Lab 06/10/22 0913 06/11/22 0347 06/12/22 0624 06/13/22 0534 06/14/22 0647 06/16/22 1323  NA 139 136 136 135 135 129*  K 4.6 3.8 4.1 3.9 4.0 3.7  CL 112* 104 106 102 102 98  CO2 _0 19*  GLUCOSE 113* 100* 148* 107* 127* 153*  BUN 21 18 25* 28* 28* 68*  CREATININE 1.89* 1.64* 1.76* 1.58* 1.72* 3.16*  CALCIUM 8.3* 8.2* 8.3* 8.2* 8.2* 8.0*  MG 1.9 1.9 2.0 1.9 1.9  --   PHOS 4.6  --   --   --   --   --    GFR: Estimated Creatinine Clearance: 21.8 mL/min (A) (by C-G formula based on SCr of 3.16 mg/dL (H)). Liver Function Tests: Recent Labs  Lab 06/10/22 0913 06/16/22 1323  AST 13* 20  ALT 15 16  ALKPHOS 57 48  BILITOT 1.3* 1.4*  PROT 5.7* 6.0*  ALBUMIN 3.2* 2.5*    Coagulation Profile: Recent Labs  Lab 06/16/22 1323  INR 2.4*   CBG: Recent Labs  Lab 06/13/22 2107 06/14/22 0756 06/14/22 1034 06/14/22 1245 06/14/22 1626  GLUCAP 126* 124* 191* 135* 121*   EKG: Independently reviewed.   Assessment/Plan Principal Problem:   Hypotension Active Problems:   AKI (acute  kidney injury) ([place])   Syncope   Closed comminuted intertrochanteric fracture of left femur (HCC)   Acute anemia   Essential hypertension   Atrial fibrillation (HCC)   Non Hodgkin's lymphoma (HCC)   Chronic combined systolic and diastolic CHF (congestive heart failure) (HCC)   Stage 3b chronic kidney disease ([place])   DNR (do not resuscitate) discussion   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[place])   Thrombocytopenia ([place])  Assessment and Plan: * Hypotension Presenting with syncope. Blood pressure down to 79/55, improved, systolic currently high 16X, after family no bolus given in ED.  Etiology likely combination of dehydration and acute anemia. -Transfuse 1 unit PRBC -Hold home Norvasc, hydralazine, Imdur, Lasix 20 mg every other day  AKI (acute kidney injury) ([place]) AKI on CKD stage IIIb.  Creatinine elevated at 3.16, baseline 1.5-1.8.  Likely prerenal from dehydration, he reports poor oral intake, he is on Lasix 20 mg every 48 hours. -500 mill bolus given, transfuse 1 unit PRBC - Cont N/s 75cc/hr x 12hrs    Syncope From his anemia and hypotension from dehydration.   Acute anemia Hemoglobin down to 8.1 here, probably lower as he is  dehydrated.  Pre-op hemoglobin was 11.9.  He is on anticoagulation with Eliquis.  He has palpable swelling to lateral side of left hip.  Stool FOBT negative ED.  He is also on chemotherapy for lung cancer, NHL. -Transfuse 1 unit PRBC -CBC in a.m. -Hold Eliquis for now - Obtain CT left hip without contrast  Closed comminuted intertrochanteric fracture of left femur (HCC) Status post recent hip surgery 9/22 by Dr. Amedeo Kinsman.  Screw site looks clean.  But likely has intramuscular hematoma. -Obtain CT left hip. -Hold Eliquis  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 (HCC) A1c 5.  Not on medication - Daily a.m CBG  Thrombocytopenia (HCC) Platelets 59.  Gradual downtrend from platelets of 148 on recent hospitalization. -Hold anticoagulation with  Eliquis -Trend for now  Chronic combined systolic and diastolic CHF (congestive heart failure) (HCC)  Stable and compensated.  Last echo on 11/2020 EF of 50%, G1DD.  Non Hodgkin's lymphoma ([place]) Follows with hematology at [facility]  Also with brain metastasis.  Currently on chemotherapy. - Hold chemo for now, spouse requests to be held for now  Atrial fibrillation ([place]) Rate controlled and on anticoagulation with Eliquis. -hold Eliquis -Resume amiodarone  Essential hypertension Hypotensive. -Hold antihypertensives for now   DVT prophylaxis: SCDS Code Status: DNR-universal DNR form present at bedside, confirmed with spouse and future daughter-in-law Pam at bedside. Family Communication: spouse and future daughter-in-law Pam at bedside-at bedside. Disposition Plan: ~ 2 DAYS Consults called: None Admission status: Obs tele    Author: Bethena Roys, MD 06/16/2022 6:09 PM  For on call review [email or web address].

## 2022-06-16 NOTE — ED Notes (Addendum)
gave pt a more appropriate cuff. BP improved. BP is soft, but not low at this time. Will monitor. EDPA aware. Pt denies sx, including dizziness or sob. Pending labs results.  Pt alert, NAD, calm, interactive, resps e/u, speaking clearly.

## 2022-06-17 ENCOUNTER — Encounter (HOSPITAL_COMMUNITY): Payer: Self-pay | Admitting: Internal Medicine

## 2022-06-17 DIAGNOSIS — E1122 Type 2 diabetes mellitus with diabetic chronic kidney disease: Secondary | ICD-10-CM | POA: Diagnosis present

## 2022-06-17 DIAGNOSIS — Y838 Other surgical procedures as the cause of abnormal reaction of the patient, or of later complication, without mention of misadventure at the time of the procedure: Secondary | ICD-10-CM | POA: Diagnosis present

## 2022-06-17 DIAGNOSIS — C8258 Diffuse follicle center lymphoma, lymph nodes of multiple sites: Secondary | ICD-10-CM | POA: Diagnosis not present

## 2022-06-17 DIAGNOSIS — D61810 Antineoplastic chemotherapy induced pancytopenia: Secondary | ICD-10-CM | POA: Diagnosis present

## 2022-06-17 DIAGNOSIS — E861 Hypovolemia: Secondary | ICD-10-CM | POA: Diagnosis not present

## 2022-06-17 DIAGNOSIS — J449 Chronic obstructive pulmonary disease, unspecified: Secondary | ICD-10-CM | POA: Diagnosis present

## 2022-06-17 DIAGNOSIS — Z7189 Other specified counseling: Secondary | ICD-10-CM | POA: Diagnosis not present

## 2022-06-17 DIAGNOSIS — A4151 Sepsis due to Escherichia coli [E. coli]: Secondary | ICD-10-CM | POA: Diagnosis present

## 2022-06-17 DIAGNOSIS — R6521 Severe sepsis with septic shock: Secondary | ICD-10-CM | POA: Diagnosis not present

## 2022-06-17 DIAGNOSIS — N179 Acute kidney failure, unspecified: Secondary | ICD-10-CM | POA: Diagnosis present

## 2022-06-17 DIAGNOSIS — N170 Acute kidney failure with tubular necrosis: Secondary | ICD-10-CM | POA: Diagnosis present

## 2022-06-17 DIAGNOSIS — I5042 Chronic combined systolic (congestive) and diastolic (congestive) heart failure: Secondary | ICD-10-CM | POA: Diagnosis present

## 2022-06-17 DIAGNOSIS — D649 Anemia, unspecified: Secondary | ICD-10-CM | POA: Diagnosis not present

## 2022-06-17 DIAGNOSIS — W19XXXA Unspecified fall, initial encounter: Secondary | ICD-10-CM | POA: Diagnosis present

## 2022-06-17 DIAGNOSIS — N390 Urinary tract infection, site not specified: Secondary | ICD-10-CM | POA: Diagnosis present

## 2022-06-17 DIAGNOSIS — Z20822 Contact with and (suspected) exposure to covid-19: Secondary | ICD-10-CM | POA: Diagnosis present

## 2022-06-17 DIAGNOSIS — E871 Hypo-osmolality and hyponatremia: Secondary | ICD-10-CM | POA: Diagnosis present

## 2022-06-17 DIAGNOSIS — Z794 Long term (current) use of insulin: Secondary | ICD-10-CM | POA: Diagnosis not present

## 2022-06-17 DIAGNOSIS — Z515 Encounter for palliative care: Secondary | ICD-10-CM | POA: Diagnosis not present

## 2022-06-17 DIAGNOSIS — D6832 Hemorrhagic disorder due to extrinsic circulating anticoagulants: Secondary | ICD-10-CM | POA: Diagnosis present

## 2022-06-17 DIAGNOSIS — S72142A Displaced intertrochanteric fracture of left femur, initial encounter for closed fracture: Secondary | ICD-10-CM | POA: Diagnosis present

## 2022-06-17 DIAGNOSIS — A419 Sepsis, unspecified organism: Secondary | ICD-10-CM | POA: Diagnosis not present

## 2022-06-17 DIAGNOSIS — I130 Hypertensive heart and chronic kidney disease with heart failure and stage 1 through stage 4 chronic kidney disease, or unspecified chronic kidney disease: Secondary | ICD-10-CM | POA: Diagnosis present

## 2022-06-17 DIAGNOSIS — Z66 Do not resuscitate: Secondary | ICD-10-CM | POA: Diagnosis present

## 2022-06-17 DIAGNOSIS — M96840 Postprocedural hematoma of a musculoskeletal structure following a musculoskeletal system procedure: Secondary | ICD-10-CM | POA: Diagnosis present

## 2022-06-17 DIAGNOSIS — C3490 Malignant neoplasm of unspecified part of unspecified bronchus or lung: Secondary | ICD-10-CM | POA: Diagnosis present

## 2022-06-17 DIAGNOSIS — C8190 Hodgkin lymphoma, unspecified, unspecified site: Secondary | ICD-10-CM | POA: Diagnosis present

## 2022-06-17 DIAGNOSIS — D62 Acute posthemorrhagic anemia: Secondary | ICD-10-CM | POA: Diagnosis present

## 2022-06-17 DIAGNOSIS — N1832 Chronic kidney disease, stage 3b: Secondary | ICD-10-CM | POA: Diagnosis present

## 2022-06-17 DIAGNOSIS — C7931 Secondary malignant neoplasm of brain: Secondary | ICD-10-CM | POA: Diagnosis present

## 2022-06-17 DIAGNOSIS — I9589 Other hypotension: Secondary | ICD-10-CM | POA: Diagnosis not present

## 2022-06-17 DIAGNOSIS — I4821 Permanent atrial fibrillation: Secondary | ICD-10-CM | POA: Diagnosis present

## 2022-06-17 DIAGNOSIS — I482 Chronic atrial fibrillation, unspecified: Secondary | ICD-10-CM | POA: Diagnosis not present

## 2022-06-17 DIAGNOSIS — Z1611 Resistance to penicillins: Secondary | ICD-10-CM | POA: Diagnosis present

## 2022-06-17 LAB — TYPE AND SCREEN
ABO/RH(D): O POS
Antibody Screen: NEGATIVE
Unit division: 0

## 2022-06-17 LAB — BASIC METABOLIC PANEL
Anion gap: 6 (ref 5–15)
BUN: 63 mg/dL — ABNORMAL HIGH (ref 8–23)
CO2: 19 mmol/L — ABNORMAL LOW (ref 22–32)
Calcium: 7.5 mg/dL — ABNORMAL LOW (ref 8.9–10.3)
Chloride: 107 mmol/L (ref 98–111)
Creatinine, Ser: 2.49 mg/dL — ABNORMAL HIGH (ref 0.61–1.24)
GFR, Estimated: 26 mL/min — ABNORMAL LOW (ref >60)
Glucose, Bld: 109 mg/dL — ABNORMAL HIGH (ref 70–99)
Potassium: 3.8 mmol/L (ref 3.5–5.1)
Sodium: 132 mmol/L — ABNORMAL LOW (ref 135–145)

## 2022-06-17 LAB — CBC
HCT: 25.4 % — ABNORMAL LOW (ref 39.0–52.0)
Hemoglobin: 8.5 g/dL — ABNORMAL LOW (ref 13.0–17.0)
MCH: 31.7 pg (ref 26.0–34.0)
MCHC: 33.5 g/dL (ref 30.0–36.0)
MCV: 94.8 fL (ref 80.0–100.0)
Platelets: 50 10*3/uL — ABNORMAL LOW (ref 150–400)
RBC: 2.68 MIL/uL — ABNORMAL LOW (ref 4.22–5.81)
RDW: 14.8 % (ref 11.5–15.5)
WBC: 3.4 10*3/uL — ABNORMAL LOW (ref 4.0–10.5)
nRBC: 0 % (ref 0.0–0.2)

## 2022-06-17 LAB — GLUCOSE, CAPILLARY
Glucose-Capillary: 109 mg/dL — ABNORMAL HIGH (ref 70–99)
Glucose-Capillary: 122 mg/dL — ABNORMAL HIGH (ref 70–99)
Glucose-Capillary: 134 mg/dL — ABNORMAL HIGH (ref 70–99)

## 2022-06-17 LAB — BPAM RBC
Blood Product Expiration Date: 202310312359
ISSUE DATE / TIME: 202309271905
Unit Type and Rh: 5100

## 2022-06-17 MED ORDER — ALBUTEROL SULFATE HFA 108 (90 BASE) MCG/ACT IN AERS: 2.0000 | INHALATION_SPRAY | Freq: Four times a day (QID) | RESPIRATORY_TRACT | Status: DC | PRN

## 2022-06-17 MED ORDER — FERROUS SULFATE 325 (65 FE) MG PO TABS
325.0000 mg | ORAL_TABLET | Freq: Every day | ORAL | Status: DC
  Administered 2022-06-18 – 2022-06-29 (×12): 325 mg via ORAL
  Filled 2022-06-17 (×12): qty 1

## 2022-06-17 MED ORDER — SODIUM CHLORIDE 0.9 % IV SOLN: INTRAVENOUS | Status: AC

## 2022-06-17 MED ORDER — AMIODARONE HCL 200 MG PO TABS
200.0000 mg | ORAL_TABLET | Freq: Every day | ORAL | Status: DC
  Administered 2022-06-17 – 2022-06-29 (×13): 200 mg via ORAL
  Filled 2022-06-17 (×13): qty 1

## 2022-06-17 MED ORDER — ALBUTEROL SULFATE (2.5 MG/3ML) 0.083% IN NEBU
2.5000 mg | INHALATION_SOLUTION | Freq: Four times a day (QID) | RESPIRATORY_TRACT | Status: DC | PRN
  Administered 2022-06-23: 2.5 mg via RESPIRATORY_TRACT
  Filled 2022-06-17: qty 3

## 2022-06-17 NOTE — Progress Notes (Signed)
PROGRESS NOTE  Darius Smith  MWU:[id number] DOB: April 09, 1946 DOA: 06/16/2022 PCP: [name], MD   Brief Narrative:  Patient is a 76 year old male with history of CKD stage 3b, COPD, combined systolic/diastolic CHF, A-fib, lung cancer with brain mets, Hodgkin's lymphoma who was brought to the emergency department from pain center after he passed out.  As per the report, he felt dizzy, passed out transiently.  He was reporting intermittent dizziness over the last few days, poor oral intake.  Also reported persistent pain on the left hip where  surgery was done.  He was recently hospitalized here and was discharged on 9/25 after being managed for left femur fracture status post ORIF by Dr. Amedeo Kinsman.  On presentation, he was hypotensive, given IV fluids.  Hemoglobin was 8.1.  Creatinine was 3.1, sodium of 129.  Patient was admitted for the management of acute kidney injury on CKD, hypotension, anemia.   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  Hypotensive on presentation.  Blood pressure still soft.  Likely from dehydration, anemia.  Home antihypertensives: Norvasc, hydralazine, Imdur, Lasix on hold.  Continue gentle IV fluids  AKI on CKD stage IIIb: Baseline creatinine ranges from 1.5-1.8.  Presented with creatinine in the range of 3.1.  Currently kidney function improving with gentle IV fluid.  Lasix on hold.  Acute  blood loss anemia/chronic normocytic anemia/thrombocytopenia: Preop hemoglobin was 11.9, hemoglobin was 8.1 on presentation.  Given a unit of blood transfusion.  FOBT negative.  He was on Eliquis.  This is most likely from local bleeding at the surgical site.  He had a palpable swelling on the lateral side of the left hip near surgical area.  FOBT negative. His chronic anemia is most likely secondary to chemotherapy for NHL.Monitor platelet level  Chronic combined systolic/diastolic CHF: Not volume overloaded.  Last echo on 3/22 showed EF of 72%, grade 1 diastolic dysfunction.  He was given gentle IV fluids  Permanent A-fib: Currently rate is controlled.  Antihypertensives on hold.  Eliquis on hold.  Continue amiodarone  Hypertension: Currently blood pressure soft.  Antihypertensives normal  Hyponatremia: Improving  History of non-Hodgkin's lymphoma: Follows with oncology at [facility].  History of mets to brain.  Was on chemo.  Diabetes type 2: Recent A1c of 5.  Not on medication.  Monitor blood sugars  Recent left hip fracture: History of comminuted intertrochanteric fracture of left femur.  Status post ORIF on 9/22.  Wound looks clean but was suspected to have intramuscular hematoma.CT hip showed slight finding of hematoma but negative for large hematoma or bleeding          DVT prophylaxis:SCDs Start: 06/16/22 1737     Code Status: DNR  Family Communication: None at bedside  Patient status:Obs  Patient is from :SnF  Anticipated discharge to:SNF  Estimated DC date:1-2 days   Consultants: None  Procedures:None  Antimicrobials:  Anti-infectives (From admission, onward)  None       Subjective: Patient seen and examined at bedside today.  Hemodynamically stable.  Blood soft but he is asymptomatic, feels better.  Denies any shortness of breath or cough or increased left hip pain.  Alert and oriented.  Objective: Vitals:   06/16/22 2221 06/16/22 2221 06/17/22  0157 06/17/22 0357  BP: (!) 83/63 (!) 83/63 (!) 88/55 92/64  Pulse: 96 96 85 99  Resp: 18 18 16 20   Temp: 98.1 F (36.7 C) 98.1 F (36.7 C) 98.6 F (37 C) 98.3 F (36.8 C)  TempSrc: Oral Oral Oral   SpO2:  99% 98% 97%  Weight:      Height:        Intake/Output Summary (Last 24 hours) at 06/17/2022 1113 Last data filed at 06/17/2022 0900 Gross per 24 hour  Intake 2005.89 ml  Output 1200 ml  Net 805.89 ml   Filed Weights   06/16/22 1253 06/16/22 1645  Weight: 78.5 kg 80.3 kg    Examination:  General exam: Overall comfortable, not in distress, appears deconditioned, weak HEENT: PERRL Respiratory system:  no wheezes or crackles  Cardiovascular system: S1 & S2 heard, RRR.  Gastrointestinal system: Abdomen is nondistended, soft and nontender. Central nervous system: Alert and oriented Extremities: No edema, no clubbing ,no cyanosis, tenderness on the left hip, clean surgical wound Skin: No rashes, no ulcers,no icterus     Data Reviewed: I have personally reviewed following labs and imaging studies  CBC: Recent Labs  Lab 06/13/22 0534 06/14/22 0647 06/16/22 0530 06/16/22 1323 06/17/22 0430  WBC 8.7 8.7 5.2 5.4 3.4*  NEUTROABS  --   --   --  4.2  --   HGB 8.3* 8.1* 7.4* 8.1* 8.5*  HCT 24.7* 24.3* 22.0* 24.2* 25.4*  MCV 95.4 95.3 94.8 94.9 94.8  PLT 98* 76* 54* 59* 50*   Basic Metabolic Panel: Recent Labs  Lab 06/11/22 0347 06/12/22 0624 06/13/22 0534 06/14/22 0647 06/16/22 1323 06/17/22 0430  NA 136 136 135 135 129* 132*  K 3.8 4.1 3.9 4.0 3.7 3.8  CL 104 106 102 102 98 107  CO2 24 23 24 26  19* 19*  GLUCOSE 100* 148* 107* 127* 153* 109*  BUN 18 25* 28* 28* 68* 63*  CREATININE 1.64* 1.76* 1.58* 1.72* 3.16* 2.49*  CALCIUM 8.2* 8.3* 8.2* 8.2* 8.0* 7.5*  MG 1.9 2.0 1.9 1.9 2.2  --      Recent Results (from the past 240 hour(s))  Surgical PCR screen     Status: None   Collection Time: 06/10/22  8:31 PM   Specimen: Nasal Mucosa; Nasal Swab  Result  Value Ref Range Status   MRSA, PCR NEGATIVE NEGATIVE Final   Staphylococcus aureus NEGATIVE NEGATIVE Final    Comment: (NOTE) The Xpert SA Assay (FDA approved for NASAL specimens in patients 22 years of age and older), is one component of a comprehensive surveillance program. It is not intended to diagnose infection nor to guide or monitor treatment. Performed at [facility], [address], [place], [place] [address]   SARS Coronavirus 2 by RT PCR (hospital order, performed in [facility] hospital lab) *cepheid single result test* Anterior Nasal Swab     Status: None   Collection Time: 06/14/22 10:42 AM   Specimen: Anterior Nasal Swab  Result Value Ref Range Status   SARS Coronavirus 2 by RT PCR NEGATIVE NEGATIVE Final    Comment: (NOTE) SARS-CoV-2 target nucleic acids are NOT DETECTED.  The SARS-CoV-2 RNA  is generally detectable in upper and lower respiratory specimens during the acute phase of infection. The lowest concentration of SARS-CoV-2 viral copies this assay can detect is 250 copies / mL. A negative result does not preclude SARS-CoV-2 infection and should not be used as the sole basis for treatment or other patient management decisions.  A negative result may occur with improper specimen collection / handling, submission of specimen other than nasopharyngeal swab, presence of viral mutation(s) within the areas targeted by this assay, and inadequate number of viral copies (<250 copies / mL). A negative result must be combined with clinical observations, patient history, and epidemiological information.  Fact Sheet for Patients:   https://www.patel.info/  Fact Sheet for Healthcare Providers: https://hall.com/  This test is not yet approved or  cleared by the [place] FDA and has been authorized for detection and/or diagnosis of SARS-CoV-2 by FDA under an Emergency Use Authorization (EUA).  This EUA will remain in effect  (meaning this test can be used) for the duration of the COVID-19 declaration under Section 564(b)(1) of the Act, 21 U.S.C. section 360bbb-3(b)(1), unless the authorization is terminated or revoked sooner.  Performed at [facility], [address] [place], [place] [address]      Radiology Studies: CT HIP LEFT WO CONTRAST  Result Date: 06/16/2022 CLINICAL DATA:  Persistent left hip pain. Recent hip fracture and internal fixation. EXAM: CT OF THE LEFT HIP WITHOUT CONTRAST TECHNIQUE: Multidetector CT imaging of the left hip was performed according to the standard protocol. Multiplanar CT image reconstructions were also generated. RADIATION DOSE REDUCTION: This exam was performed according to the departmental dose-optimization program which includes automated exposure control, adjustment of the mA and/or kV according to patient size and/or use of iterative reconstruction technique. COMPARISON:  Radiographs 06/11/2022 FINDINGS: Intramedullary gamma nail in the femur with a dynamic hip screw transfixing the complex comminuted intertrochanteric fracture. The position and alignment appear good and stable. I do not see any complicating features associated with the hardware. No acetabular fracture. The left pubic bones are intact. The pubic symphysis and left SI joint are intact. No definite sacral fractures. Evidence of a remote/chronic tear of the gluteus minimus muscle which demonstrates fatty atrophy and calcifications. I do not see a large intramuscular hematoma or subcutaneous hematoma. Expected inflammation in the muscles surrounding the hip and some scattered fluid/hematoma. No significant left-sided intrapelvic abnormalities. IMPRESSION: 1. Intramedullary gamma nail in the femur with a dynamic hip screw transfixing the complex comminuted intertrochanteric fracture. The position and alignment appear good and stable. No complicating features associated with the hardware. 2. Expected inflammation in the  muscles surrounding the hip and some scattered fluid/hematoma. No large intramuscular hematoma or subcutaneous hematoma. 3. Remote/chronic tear of the gluteus minimus muscle which demonstrates fatty atrophy and calcifications. Electronically Signed   By: Marijo Sanes M.D.   On: 06/16/2022 18:11    Scheduled Meds: Continuous Infusions:   LOS: 0 days   Shelly Coss, MD [facility] P9/28/2023, 11:13 AM

## 2022-06-17 NOTE — NC FL2 (Signed)
[place] MEDICAID FL2 LEVEL OF CARE SCREENING TOOL     IDENTIFICATION  Patient Name: Darius Smith Birthdate: 27-Dec-1945 Sex: male Admission Date (Current Location): 06/16/2022  [facility] and [place] Number:  [employer] and Address:  [place] [address], [place]      Provider Number: [id number]  Attending Physician Name and Address:  Shelly Coss, MD  Relative Name and Phone Number:  Kerrion, Kemppainen (Spouse)   [phone]    Current Level of Care: Hospital Recommended Level of Care: [place] Prior Approval Number:    Date Approved/Denied:   PASRR Number: [id number]  Discharge Plan: SNF    Current Diagnoses: Patient Active Problem List   Diagnosis Date Noted   Pancytopenia ([place]) 06/16/2022   Symptomatic hypotension 06/16/2022   Hypotension 06/16/2022   Syncope 06/16/2022   AKI (acute kidney injury) ([place]) 06/16/2022   Acute anemia 06/16/2022   Hypokalemia 06/10/2022   Thrombocytopenia ([place]) 06/10/2022   Normocytic anemia 06/10/2022   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 ([place]) 06/10/2022   COPD (chronic obstructive pulmonary disease) ([place]) 06/10/2022   Closed comminuted intertrochanteric fracture of left femur ([place]) 06/09/2022   Mixed conductive and sensorineural hearing loss of both ears 03/17/2022   Encounter for general adult medical examination with abnormal findings 03/17/2022   Prostate cancer screening 03/17/2022   Steroid-induced hyperglycemia 03/17/2022   CHF (congestive heart failure) ([place]) 06/24/2021   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[place])    Hyperkalemia 06/01/2021   Secondary hypercoagulable state ([place]) 08/27/2020   DNR (do not resuscitate) discussion    Stage 3b chronic kidney disease ([place]) 07/31/2020   Chronic combined systolic and diastolic CHF (congestive heart failure) ([place]) 07/14/2020   Non Hodgkin's lymphoma ([place]) 06/11/2020   Atrial fibrillation ([place]) 03/20/2020    Essential hypertension 08/08/2014   GERD (gastroesophageal reflux disease) 08/08/2014    Orientation RESPIRATION BLADDER Height & Weight     Self, Time, Situation, Place  Normal Continent Weight: 177 lb 0.4 oz (80.3 kg) Height:  6' (182.9 cm)  BEHAVIORAL SYMPTOMS/MOOD NEUROLOGICAL BOWEL NUTRITION STATUS      Continent Diet (regular)  AMBULATORY STATUS COMMUNICATION OF NEEDS Skin   Limited Assist Verbally Surgical wounds (left hip)                       Personal Care Assistance Level of Assistance  Bathing, Feeding, Dressing Bathing Assistance: Limited assistance Feeding assistance: Independent Dressing Assistance: Limited assistance     Functional Limitations Info  Sight, Hearing, Speech Sight Info: Adequate Hearing Info: Adequate      SPECIAL CARE FACTORS FREQUENCY  PT (By licensed PT)     PT Frequency: 5x/week              Contractures Contractures Info: Not present    Additional Factors Info  Code Status, Allergies Code Status Info: DNR Allergies Info: Flomax, Heparin           Current Medications (06/17/2022):  This is the current hospital active medication list Current Facility-Administered Medications  Medication Dose Route Frequency Provider Last Rate Last Admin   0.9 %  sodium chloride infusion   Intravenous Continuous Adhikari, Amrit, MD       acetaminophen (TYLENOL) tablet 650 mg  650 mg Oral Q6H PRN Emokpae, Ejiroghene E, MD       Or   acetaminophen (TYLENOL) suppository 650 mg  650 mg Rectal Q6H PRN Emokpae, Leanne Chang, MD  albuterol (PROVENTIL) (2.5 MG/3ML) 0.083% nebulizer solution 2.5 mg  2.5 mg Nebulization Q6H PRN Shelly Coss, MD       amiodarone (PACERONE) tablet 200 mg  200 mg Oral Daily Adhikari, Amrit, MD   200 mg at 06/17/22 1148   [START ON 06/18/2022] ferrous sulfate tablet 325 mg  325 mg Oral Q breakfast Adhikari, Amrit, MD       ondansetron (ZOFRAN) tablet 4 mg  4 mg Oral Q6H PRN Emokpae, Ejiroghene E, MD       Or    ondansetron (ZOFRAN) injection 4 mg  4 mg Intravenous Q6H PRN Emokpae, Ejiroghene E, MD       oxyCODONE (Oxy IR/ROXICODONE) immediate release tablet 5 mg  5 mg Oral Q8H PRN Emokpae, Ejiroghene E, MD   5 mg at 06/17/22 1034   polyethylene glycol (MIRALAX / GLYCOLAX) packet 17 g  17 g Oral Daily PRN Emokpae, Ejiroghene E, MD         Discharge Medications: Please see discharge summary for a list of discharge medications.  Relevant Imaging Results:  Relevant Lab Results:   Additional Information SSN:  [id number]  [name], LCSW

## 2022-06-17 NOTE — TOC Initial Note (Signed)
Transition of Care [facility]) - Initial/Assessment Note    Patient Details  Name: Darius Smith MRN: [id number] Date of Birth: 1946/04/06  [facility] CM/SW Contact:    [name], LCSW Phone Number: 06/17/2022, 1:19 PM  Clinical Narrative:                 Patient admitted to [facility] for short term rehab on Monday, 06/14/22. Patient will need insurance auth to return to facility. PT eval orders are in.   Expected Discharge Plan: Skilled Nursing Facility Barriers to Discharge: Continued Medical Work up   Patient Goals and CMS Choice Patient states their goals for this hospitalization and ongoing recovery are:: patient is in short term rehab      Expected Discharge Plan and Services Expected Discharge Plan: [place]                                              Prior Living Arrangements/Services                       Activities of Daily Living Home Assistive Devices/Equipment: Wheelchair, Eyeglasses, Radio producer (specify quad or straight), Walker (specify type) ADL Screening (condition at time of admission) Patient's cognitive ability adequate to safely complete daily activities?: Yes Is the patient deaf or have difficulty hearing?: No Does the patient have difficulty seeing, even when wearing glasses/contacts?: No Does the patient have difficulty concentrating, remembering, or making decisions?: Yes Patient able to express need for assistance with ADLs?: Yes Does the patient have difficulty dressing or bathing?: Yes Independently performs ADLs?: No Communication: Independent Dressing (OT): Needs assistance Is this a change from baseline?: Pre-admission baseline Grooming: Independent Feeding: Independent Bathing: Needs assistance Is this a change from baseline?: Pre-admission baseline Toileting: Needs assistance Is this a change from baseline?: Pre-admission baseline In/Out Bed: Needs assistance Is this a change from baseline?:  Pre-admission baseline Walks in Home: Needs assistance Is this a change from baseline?: Pre-admission baseline Does the patient have difficulty walking or climbing stairs?: Yes Weakness of Legs: Left Weakness of Arms/Hands: None  Permission Sought/Granted                  Emotional Assessment              Admission diagnosis:  AKI (acute kidney injury) ([place]) [N17.9] Hypotension [I95.9] Hypotension, unspecified hypotension type [I95.9] Anemia, unspecified type [D64.9] Syncope [R55] Patient Active Problem List   Diagnosis Date Noted   Pancytopenia ([place]) 06/16/2022   Symptomatic hypotension 06/16/2022   Hypotension 06/16/2022   Syncope 06/16/2022   AKI (acute kidney injury) ([place]) 06/16/2022   Acute anemia 06/16/2022   Hypokalemia 06/10/2022   Thrombocytopenia ([place]) 06/10/2022   Normocytic anemia 06/10/2022   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 ([place]) 06/10/2022   COPD (chronic obstructive pulmonary disease) ([place]) 06/10/2022   Closed comminuted intertrochanteric fracture of left femur ([place]) 06/09/2022   Mixed conductive and sensorineural hearing loss of both ears 03/17/2022   Encounter for general adult medical examination with abnormal findings 03/17/2022   Prostate cancer screening 03/17/2022   Steroid-induced hyperglycemia 03/17/2022   CHF (congestive heart failure) ([place]) 06/24/2021   Malignant neoplasm metastatic to brain [facility])    Hyperkalemia 06/01/2021   Secondary hypercoagulable state ([place]) 08/27/2020   DNR (do not resuscitate) discussion    Stage  3b chronic kidney disease ([place]) 07/31/2020   Chronic combined systolic and diastolic CHF (congestive heart failure) ([place]) 07/14/2020   Non Hodgkin's lymphoma ([place]) 06/11/2020   Atrial fibrillation ([place]) 03/20/2020   Essential hypertension 08/08/2014   GERD (gastroesophageal reflux disease) 08/08/2014   PCP:  [name], MD Pharmacy:   [facility] [place] [address] [place] [address] Phone: [phone] Fax: [phone]     Social Determinants of Health (SDOH) Interventions Housing Interventions: Intervention Not Indicated  Readmission Risk Interventions    08/04/2020    4:53 PM 08/01/2020    9:34 AM  Readmission Risk Prevention Plan  Transportation Screening Complete Complete  HRI or Home Care Consult Complete Complete  Social Work Consult for [place] Planning/Counseling Complete Complete  Palliative Care Screening Complete Not Applicable  Medication Review Press photographer) Complete Complete

## 2022-06-18 DIAGNOSIS — D649 Anemia, unspecified: Secondary | ICD-10-CM | POA: Diagnosis not present

## 2022-06-18 DIAGNOSIS — E861 Hypovolemia: Secondary | ICD-10-CM | POA: Diagnosis not present

## 2022-06-18 DIAGNOSIS — C8258 Diffuse follicle center lymphoma, lymph nodes of multiple sites: Secondary | ICD-10-CM | POA: Diagnosis not present

## 2022-06-18 DIAGNOSIS — I9589 Other hypotension: Secondary | ICD-10-CM | POA: Diagnosis not present

## 2022-06-18 DIAGNOSIS — N179 Acute kidney failure, unspecified: Secondary | ICD-10-CM | POA: Diagnosis not present

## 2022-06-18 LAB — CBC
HCT: 25.9 % — ABNORMAL LOW (ref 39.0–52.0)
Hemoglobin: 8.5 g/dL — ABNORMAL LOW (ref 13.0–17.0)
MCH: 31.6 pg (ref 26.0–34.0)
MCHC: 32.8 g/dL (ref 30.0–36.0)
MCV: 96.3 fL (ref 80.0–100.0)
Platelets: 42 10*3/uL — ABNORMAL LOW (ref 150–400)
RBC: 2.69 MIL/uL — ABNORMAL LOW (ref 4.22–5.81)
RDW: 14.8 % (ref 11.5–15.5)
WBC: 2.9 10*3/uL — ABNORMAL LOW (ref 4.0–10.5)
nRBC: 0 % (ref 0.0–0.2)

## 2022-06-18 LAB — FOLATE: Folate: 8.8 ng/mL (ref >5.9)

## 2022-06-18 LAB — BASIC METABOLIC PANEL
Anion gap: 7 (ref 5–15)
BUN: 41 mg/dL — ABNORMAL HIGH (ref 8–23)
CO2: 20 mmol/L — ABNORMAL LOW (ref 22–32)
Calcium: 7.7 mg/dL — ABNORMAL LOW (ref 8.9–10.3)
Chloride: 111 mmol/L (ref 98–111)
Creatinine, Ser: 1.92 mg/dL — ABNORMAL HIGH (ref 0.61–1.24)
GFR, Estimated: 36 mL/min — ABNORMAL LOW (ref >60)
Glucose, Bld: 115 mg/dL — ABNORMAL HIGH (ref 70–99)
Potassium: 3.7 mmol/L (ref 3.5–5.1)
Sodium: 138 mmol/L (ref 135–145)

## 2022-06-18 LAB — RETICULOCYTES
Immature Retic Fract: 16.3 % — ABNORMAL HIGH (ref 2.3–15.9)
RBC.: 2.43 MIL/uL — ABNORMAL LOW (ref 4.22–5.81)
Retic Count, Absolute: 18.7 10*3/uL — ABNORMAL LOW (ref 19.0–186.0)
Retic Ct Pct: 0.8 % (ref 0.4–3.1)

## 2022-06-18 LAB — SARS CORONAVIRUS 2 BY RT PCR: SARS Coronavirus 2 by RT PCR: NEGATIVE

## 2022-06-18 LAB — LACTATE DEHYDROGENASE: LDH: 101 U/L (ref 98–192)

## 2022-06-18 LAB — VITAMIN B12: Vitamin B-12: 212 pg/mL (ref 180–914)

## 2022-06-18 LAB — GLUCOSE, CAPILLARY: Glucose-Capillary: 112 mg/dL — ABNORMAL HIGH (ref 70–99)

## 2022-06-18 MED ORDER — METOPROLOL TARTRATE 25 MG PO TABS
12.5000 mg | ORAL_TABLET | Freq: Two times a day (BID) | ORAL | Status: DC
  Administered 2022-06-18 – 2022-06-20 (×5): 12.5 mg via ORAL
  Filled 2022-06-18 (×5): qty 1

## 2022-06-18 NOTE — Evaluation (Signed)
Physical Therapy Evaluation Patient Details Name: Darius Smith MRN: [id number] DOB: 14-Jun-1946 Today's Date: 06/18/2022  History of Present Illness  Darius Smith is a 76 y.o. male with medical history significant for CKD 3, COPD, systolic and diastolic CHF, atrial fibrillation, lung cancer with brain mets and Hodgkin's lymphoma.  Patient was brought to the ED from [facility] with reports of passing out.  Patient's spouse and soon-to-be daughter-in-law [name] were at the [facility] center, assisting patient up to the bathroom, when he was upright he felt dizzy, and passed out transiently, he leaned his head against the wall he did not hit his head.  Reports intermittent dizziness over the past few days.  Reports poor oral intake over the past several days, no vomiting, reports 3 bowel movements yesterday.  No abdominal pain.  No chest pain or difficulty breathing.  No cough no fevers no chills.  Reports persistent pain in his left hip where he had recent surgery, he has not been able to bear weight or ambulate   Clinical Impression  Patient limited for functional mobility as stated below secondary to BLE weakness, fatigue and L hip pain. Patient requires min assist to transition to seated EOB. He demonstrates fair balance but mostly tends to lean off L hip and uses UE support. Patient performs seated exercises requiring assist for first reps of LAQ at bedside. Attempted to transfer to standing several times with RW and assist but patient unable due to LLE pain and weakness along with general fatigue. He was assisted back to supine at end of session. Patient will benefit from continued physical therapy in hospital and recommended venue below to increase strength, balance, endurance for safe ADLs and gait.        Recommendations for follow up therapy are one component of a multi-disciplinary discharge planning process, led by the attending physician.  Recommendations may be updated based on patient status,  additional functional criteria and insurance authorization.  Follow Up Recommendations Skilled nursing-short term rehab (<3 hours/day) Can patient physically be transported by private vehicle: No    Assistance Recommended at Discharge Frequent or constant Supervision/Assistance  Patient can return home with the following  A lot of help with bathing/dressing/bathroom;A lot of help with walking and/or transfers    Equipment Recommendations None recommended by PT  Recommendations for Other Services       Functional Status Assessment Patient has had a recent decline in their functional status and demonstrates the ability to make significant improvements in function in a reasonable and predictable amount of time.     Precautions / Restrictions Precautions Precautions: Fall Restrictions Weight Bearing Restrictions: Yes LLE Weight Bearing: Weight bearing as tolerated      Mobility  Bed Mobility Overal bed mobility: Needs Assistance Bed Mobility: Sit to Supine, Supine to Sit     Supine to sit: Min assist Sit to supine: Min assist        Transfers Overall transfer level: Needs assistance Equipment used: Rolling walker (2 wheels) Transfers: Sit to/from Stand Sit to Stand: Max assist           General transfer comment: Unable to tolerate coming to standing with RW and assist due to LLE pain and weakness, quick fatigue with several attempts    Ambulation/Gait                  Stairs            Wheelchair Mobility    Modified Rankin (Stroke Patients  Only)       Balance Overall balance assessment: Needs assistance Sitting-balance support: Feet supported, Bilateral upper extremity supported Sitting balance-Leahy Scale: Fair Sitting balance - Comments: seated EOB                                     Pertinent Vitals/Pain Pain Assessment Faces Pain Scale: Hurts even more Pain Location: L hip Pain Descriptors / Indicators: Aching,  Discomfort, Grimacing, Guarding Pain Intervention(s): Limited activity within patient's tolerance, Monitored during session, Repositioned    Home Living Family/patient expects to be discharged to:: Private residence Living Arrangements: Spouse/significant other Available Help at Discharge: Family;Available 24 hours/day Type of Home: House Home Access: Ramped entrance       Home Layout: One level Home Equipment: BSC/3in1;Cane - single point;Transport chair;Shower Land (2 wheels) Additional Comments: Was admitted from SNF rehab for hip and is planning on returning for rehab    Prior Function Prior Level of Function : Needs assist             Mobility Comments: Uses a RW and wife assists as needed ADLs Comments: Wife assists with all ADL's as needed     Hand Dominance   Dominant Hand: Right    Extremity/Trunk Assessment   Upper Extremity Assessment Upper Extremity Assessment: Defer to OT evaluation    Lower Extremity Assessment Lower Extremity Assessment: Generalized weakness;LLE deficits/detail LLE Deficits / Details: Increased pain with movement, decreased quad activation, decreased hip and knee mobility LLE: Unable to fully assess due to pain    Cervical / Trunk Assessment Cervical / Trunk Assessment: Kyphotic  Communication   Communication: No difficulties  Cognition Arousal/Alertness: Awake/alert Behavior During Therapy: WFL for tasks assessed/performed Overall Cognitive Status: Within Functional Limits for tasks assessed                                          General Comments      Exercises General Exercises - Lower Extremity Ankle Circles/Pumps: Both, 10 reps, Seated Long Arc Quad: AROM, AAROM, Left, 10 reps, Seated   Assessment/Plan    PT Assessment Patient needs continued PT services  PT Problem List Decreased strength;Decreased mobility;Decreased range of motion;Decreased activity tolerance;Decreased  balance;Pain       PT Treatment Interventions DME instruction;Gait training;Stair training;Balance training;Therapeutic exercise;Manual techniques;Therapeutic activities;Patient/family education;Functional mobility training;Neuromuscular re-education    PT Goals (Current goals can be found in the Care Plan section)  Acute Rehab PT Goals Patient Stated Goal: Return home PT Goal Formulation: With patient/family Time For Goal Achievement: 07/02/22 Potential to Achieve Goals: Good    Frequency Min 3X/week     Co-evaluation               AM-PAC PT "6 Clicks" Mobility  Outcome Measure Help needed turning from your back to your side while in a flat bed without using bedrails?: A Little Help needed moving from lying on your back to sitting on the side of a flat bed without using bedrails?: A Lot Help needed moving to and from a bed to a chair (including a wheelchair)?: A Lot Help needed standing up from a chair using your arms (e.g., wheelchair or bedside chair)?: A Lot Help needed to walk in hospital room?: A Lot Help needed climbing 3-5 steps with a railing? : A Lot  6 Click Score: 13    End of Session Equipment Utilized During Treatment: Gait belt Activity Tolerance: Patient limited by pain;Patient limited by fatigue Patient left: in bed;with call bell/phone within reach Nurse Communication: Mobility status PT Visit Diagnosis: Other abnormalities of gait and mobility (R26.89);Unsteadiness on feet (R26.81);Difficulty in walking, not elsewhere classified (R26.2);Muscle weakness (generalized) (M62.81);Pain Pain - Right/Left: Left Pain - part of body: Hip    Time: 1833-5825 PT Time Calculation (min) (ACUTE ONLY): 18 min   Charges:   PT Evaluation $PT Eval Low Complexity: 1 Low PT Treatments $Therapeutic Exercise: 8-22 mins        12:23 PM, 06/18/22 Mearl Latin PT, DPT Physical Therapist at [facility]

## 2022-06-18 NOTE — Consult Note (Signed)
[facility] Consultation Oncology  Name: Darius Smith      MRN: [id number]    Location: O130/Q657-84  Date: 06/18/2022 Time:3:39 PM   REFERRING PHYSICIAN: Dr. Tawanna Solo  REASON FOR CONSULT: Thrombocytopenia and leukopenia   DIAGNOSIS: Myelosuppression from Revlimid in the setting of worsening renal function  HISTORY OF PRESENT ILLNESS: Darius Smith is a 76 year old very pleasant white male known to me.  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  He has gotten a very good response with the treatment.  However he was admitted to the hospital on 06/09/2022 after a fall and sustained left femur intertrochanteric fracture.  On 06/11/2022 he underwent cephalomedullary nail for left intertrochanteric femur fracture.  He was sent to rehab facility on 06/14/2022.  He was taking Revlimid, last dose on 06/15/2022 per wife.  He came back to the ER on 06/16/2022 with weakness and dizziness.  He was found to be hypotensive and was given IV fluids.  He was also found to have acute renal insufficiency with creatinine of 3.16.  On hydration creatinine today has improved to 1.92.  However his white count and platelet count continued to drop.  He does not have any bleeding issues.  He is lying in the bed and wife at bedside.  He reports having dizziness when he is tired to stand up or sit in the chair.  PAST MEDICAL HISTORY:   Past Medical History:  Diagnosis Date   Allergic rhinitis 07/02/2019   Allergy 2015   Arthritis    both hands   Atrial fibrillation ([place]) 10/28/2017   Dr. Harl Bowie   Atrial fibrillation with RVR ([place]) 07/14/2020   Blood in urine    occ   Bronchitis    Cancer ([place])    Basal cell   Cardiomegaly 10/28/2017   CHF (congestive heart failure) (HCC)    Clotting disorder ([place])    Complication of anesthesia    Darius Smith developed a. fib in recovery after cysto procedure 10/2017 was  transferred to [facility] and underwent successful cardioversion   COPD (chronic obstructive pulmonary disease) ([place]) 06/10/2022   Diabetes ([place])    Diverticulosis of sigmoid colon 08/2017   Noted on colonoscopy   Dysrhythmia    External hemorrhoids 08/2017   GERD (gastroesophageal reflux disease)    History of colon polyps 08/2017   History of kidney stones    History of rectal bleeding    History of right inguinal hernia    Hypertension    Lung cancer ([place]) 06/25/2020   Lymphoma ([place])    Nephrolithiasis 07/02/2019   Normocytic anemia 06/10/2022   Rectal bleeding 09/02/2017   Added automatically from request for surgery [id number]   Shortness of breath    Stage 3 chronic kidney disease ([place])    Type 2 diabetes mellitus ([place]) 06/10/2022    ALLERGIES: Allergies  Allergen Reactions   Flomax [Tamsulosin Hcl] Nausea Only    Dizziness  Pt is on flomax   Heparin Other (See Comments)    Tingling in face and shortness of breath      MEDICATIONS: I have reviewed the patient's current medications.     PAST SURGICAL HISTORY Past Surgical History:  Procedure Laterality Date   AXILLARY LYMPH NODE BIOPSY Left 06/05/2021   Procedure: AXILLARY LYMPH NODE BIOPSY;  Surgeon: Virl Cagey, MD;  Location: [facility] ORS;  Service: General;  Laterality:  Left;   CARDIOVERSION     10/2017   CARDIOVERSION N/A 07/16/2020   Procedure: CARDIOVERSION;  Surgeon: Arnoldo Lenis, MD;  Location: [facility] ENDO SUITE;  Service: Endoscopy;  Laterality: N/A;   CARDIOVERSION N/A 08/21/2020   Procedure: CARDIOVERSION;  Surgeon: Satira Sark, MD;  Location: [facility] ORS;  Service: Cardiovascular;  Laterality: N/A;   CARDIOVERSION N/A 09/10/2020   Procedure: CARDIOVERSION;  Surgeon: Josue Hector, MD;  Location: [facility] ENDOSCOPY;  Service: Cardiovascular;  Laterality: N/A;   CHOLECYSTECTOMY     CLEFT LIP REPAIR     several from childhood til 76 years old   [place]     several from childhood til 76 years old    COLONOSCOPY  04/28/2011   Procedure: COLONOSCOPY;  Surgeon: Rogene Houston, MD;  Location: [facility];  Service: Endoscopy;  Laterality: N/A;   COLONOSCOPY N/A 07/18/2014   Procedure: COLONOSCOPY;  Surgeon: Rogene Houston, MD;  Location: [facility];  Service: Endoscopy;  Laterality: N/A;  830   COLONOSCOPY N/A 09/09/2017   Procedure: COLONOSCOPY;  Surgeon: Rogene Houston, MD;  Location: [facility] END[facility];  Service: Endoscopy;  Laterality: N/A;  955   COSMETIC SURGERY  1940's &1950's   CYSTOSCOPY/URETEROSCOPY/HOLMIUM LASER/STENT PLACEMENT Bilateral 12/06/2017   Procedure: CYSTOSCOPY/RETROGRADE/URETEROSCOPY/HOLMIUM LASER/STENT EXCHANGE;  Surgeon: Festus Aloe, MD;  Location: [facility] ORS;  Service: Urology;  Laterality: Bilateral;  ONLY NEEDS 60 MIN   HERNIA REPAIR     LYMPH NODE BIOPSY Left    Under left arm   POLYPECTOMY  09/09/2017   Procedure: POLYPECTOMY;  Surgeon: Rogene Houston, MD;  Location: [facility];  Service: Endoscopy;;  colon   vocal cord surgery      FAMILY HISTORY: Family History  Problem Relation Age of Onset   Breast cancer Mother    Cancer Mother    Prostate cancer Father    Cancer Father    COPD Sister    Birth defects Son     SOCIAL HISTORY:  reports that he has never smoked. He has never used smokeless tobacco. He reports that he does not currently use alcohol. He reports that he does not use drugs.  PERFORMANCE STATUS: The patient's performance status is 2 - Symptomatic, <50% confined to bed  PHYSICAL EXAM: Most Recent Vital Signs: Blood pressure 121/77, pulse 100, temperature 98.5 F (36.9 C), temperature source Oral, resp. rate 16, height 6' (1.829 m), weight 177 lb 0.4 oz (80.3 kg), SpO2 98 %. BP 121/77 (BP Location: Right Arm)   Pulse 100   Temp 98.5 F (36.9 C) (Oral)   Resp 16   Ht 6' (1.829 m)   Wt 177 lb 0.4 oz (80.3 kg)   SpO2 98%   BMI 24.01 kg/m  General appearance: alert, cooperative, and appears stated age Head:  Normocephalic, without obvious abnormality, atraumatic  LABORATORY DATA:  Results for orders placed or performed during the hospital encounter of 06/16/22 (from the past 48 hour(s))  Prepare RBC (crossmatch)     Status: None   Collection Time: 06/16/22  5:34 PM  Result Value Ref Range   Order Confirmation      ORDER PROCESSED BY BLOOD BANK Performed at [facility], [address], [place], [place] [address]   Basic metabolic panel     Status: Abnormal   Collection Time: 06/17/22  4:30 AM  Result Value Ref Range   Sodium 132 (L) 135 - 145 mmol/L   Potassium 3.8 3.5 - 5.1 mmol/L  Chloride 107 98 - 111 mmol/L   CO2 19 (L) 22 - 32 mmol/L   Glucose, Bld 109 (H) 70 - 99 mg/dL    Comment: Glucose reference range applies only to samples taken after fasting for at least 8 hours.   BUN 63 (H) 8 - 23 mg/dL   Creatinine, Ser 2.49 (H) 0.61 - 1.24 mg/dL   Calcium 7.5 (L) 8.9 - 10.3 mg/dL   GFR, Estimated 26 (L) >60 mL/min    Comment: (NOTE) Calculated using the CKD-EPI Creatinine Equation (2021)    Anion gap 6 5 - 15    Comment: Performed at [facility], [address], [place], [place] [address]  CBC     Status: Abnormal   Collection Time: 06/17/22  4:30 AM  Result Value Ref Range   WBC 3.4 (L) 4.0 - 10.5 K/uL   RBC 2.68 (L) 4.22 - 5.81 MIL/uL   Hemoglobin 8.5 (L) 13.0 - 17.0 g/dL   HCT 25.4 (L) 39.0 - 52.0 %   MCV 94.8 80.0 - 100.0 fL   MCH 31.7 26.0 - 34.0 pg   MCHC 33.5 30.0 - 36.0 g/dL   RDW 14.8 11.5 - 15.5 %   Platelets 50 (L) 150 - 400 K/uL    Comment: SPECIMEN CHECKED FOR CLOTS Immature Platelet Fraction may be clinically indicated, consider ordering this additional test [id number] PLATELET COUNT CONFIRMED BY SMEAR    nRBC 0.0 0.0 - 0.2 %    Comment: Performed at [facility], [address] [place], [place] [address]  Glucose, capillary     Status: Abnormal   Collection Time: 06/17/22  7:50 AM  Result Value Ref Range   Glucose-Capillary 109 (H) 70 - 99 mg/dL     Comment: Glucose reference range applies only to samples taken after fasting for at least 8 hours.  Glucose, capillary     Status: Abnormal   Collection Time: 06/17/22 11:07 AM  Result Value Ref Range   Glucose-Capillary 122 (H) 70 - 99 mg/dL    Comment: Glucose reference range applies only to samples taken after fasting for at least 8 hours.  Glucose, capillary     Status: Abnormal   Collection Time: 06/17/22  3:57 PM  Result Value Ref Range   Glucose-Capillary 134 (H) 70 - 99 mg/dL    Comment: Glucose reference range applies only to samples taken after fasting for at least 8 hours.  Glucose, capillary     Status: Abnormal   Collection Time: 06/18/22  5:28 AM  Result Value Ref Range   Glucose-Capillary 112 (H) 70 - 99 mg/dL    Comment: Glucose reference range applies only to samples taken after fasting for at least 8 hours.  CBC     Status: Abnormal   Collection Time: 06/18/22  6:33 AM  Result Value Ref Range   WBC 2.9 (L) 4.0 - 10.5 K/uL   RBC 2.69 (L) 4.22 - 5.81 MIL/uL   Hemoglobin 8.5 (L) 13.0 - 17.0 g/dL   HCT 25.9 (L) 39.0 - 52.0 %   MCV 96.3 80.0 - 100.0 fL   MCH 31.6 26.0 - 34.0 pg   MCHC 32.8 30.0 - 36.0 g/dL   RDW 14.8 11.5 - 15.5 %   Platelets 42 (L) 150 - 400 K/uL    Comment: SPECIMEN CHECKED FOR CLOTS Immature Platelet Fraction may be clinically indicated, consider ordering this additional test [id number] CONSISTENT WITH PREVIOUS RESULT REPEATED TO VERIFY    nRBC 0.0 0.0 - 0.2 %  Comment: Performed at [facility], [address] [place], [place] [address]  Basic metabolic panel     Status: Abnormal   Collection Time: 06/18/22  6:33 AM  Result Value Ref Range   Sodium 138 135 - 145 mmol/L   Potassium 3.7 3.5 - 5.1 mmol/L   Chloride 111 98 - 111 mmol/L   CO2 20 (L) 22 - 32 mmol/L   Glucose, Bld 115 (H) 70 - 99 mg/dL    Comment: Glucose reference range applies only to samples taken after fasting for at least 8 hours.   BUN 41 (H) 8 - 23 mg/dL   Creatinine,  Ser 1.92 (H) 0.61 - 1.24 mg/dL   Calcium 7.7 (L) 8.9 - 10.3 mg/dL   GFR, Estimated 36 (L) >60 mL/min    Comment: (NOTE) Calculated using the CKD-EPI Creatinine Equation (2021)    Anion gap 7 5 - 15    Comment: Performed at [facility], [address], [place], [place] [address]      RADIOGRAPHY: CT HIP LEFT WO CONTRAST  Result Date: 06/16/2022 CLINICAL DATA:  Persistent left hip pain. Recent hip fracture and internal fixation. EXAM: CT OF THE LEFT HIP WITHOUT CONTRAST TECHNIQUE: Multidetector CT imaging of the left hip was performed according to the standard protocol. Multiplanar CT image reconstructions were also generated. RADIATION DOSE REDUCTION: This exam was performed according to the departmental dose-optimization program which includes automated exposure control, adjustment of the mA and/or kV according to patient size and/or use of iterative reconstruction technique. COMPARISON:  Radiographs 06/11/2022 FINDINGS: Intramedullary gamma nail in the femur with a dynamic hip screw transfixing the complex comminuted intertrochanteric fracture. The position and alignment appear good and stable. I do not see any complicating features associated with the hardware. No acetabular fracture. The left pubic bones are intact. The pubic symphysis and left SI joint are intact. No definite sacral fractures. Evidence of a remote/chronic tear of the gluteus minimus muscle which demonstrates fatty atrophy and calcifications. I do not see a large intramuscular hematoma or subcutaneous hematoma. Expected inflammation in the muscles surrounding the hip and some scattered fluid/hematoma. No significant left-sided intrapelvic abnormalities. IMPRESSION: 1. Intramedullary gamma nail in the femur with a dynamic hip screw transfixing the complex comminuted intertrochanteric fracture. The position and alignment appear good and stable. No complicating features associated with the hardware. 2. Expected inflammation in the  muscles surrounding the hip and some scattered fluid/hematoma. No large intramuscular hematoma or subcutaneous hematoma. 3. Remote/chronic tear of the gluteus minimus muscle which demonstrates fatty atrophy and calcifications. Electronically Signed   By: Marijo Sanes M.D.   On: 06/16/2022 18:11         ASSESSMENT:  1.  Follicular lymphoma with CNS involvement: - Revlimid/rituximab started on 07/08/2021.  He has gotten great response to treatment. - Last PET scan and MRI of the brain on 06/02/2022 showed complete response to therapy. - He is on lenalidomide 10 mg daily 21/28 days and rituximab every 8 weeks.  PLAN:  1.  Leukopenia and thrombocytopenia: - Last dose of Revlimid on 06/15/2022 per patient's wife. - He he did not receive heparin perioperatively.  Not on any antibiotics. - Most likely from myelosuppression from Revlimid in the setting of worsening renal function. - We will check for nutritional deficiencies.  Continue to hold Revlimid at this time.  We will check LDH, S49, folic acid and MMA. - Counts will likely improve in the next few days.  2.  Left hip comminuted intertrochanteric  fracture: - Status post ORIF on 06/11/2022.  3.  Hypotension/syncope: - Antihypertensives on hold.  Patient still has some dizziness when he stands up or sits up.  4.  AKI on CKD: - Creatinine is trending down towards his baseline from a high of 3.1.  Continue gentle hydration.  All questions were answered. The patient knows to call the clinic with any problems, questions or concerns. We can certainly see the patient much sooner if necessary.    Derek Jack

## 2022-06-18 NOTE — Plan of Care (Signed)
  Problem: Acute Rehab PT Goals(only PT should resolve) Goal: Pt Will Go Supine/Side To Sit Outcome: Progressing Flowsheets (Taken 06/18/2022 1225) Pt will go Supine/Side to Sit: with modified independence Goal: Pt Will Go Sit To Supine/Side Outcome: Progressing Flowsheets (Taken 06/18/2022 1225) Pt will go Sit to Supine/Side: with modified independence Goal: Patient Will Transfer Sit To/From Stand Outcome: Progressing Flowsheets (Taken 06/18/2022 1225) Patient will transfer sit to/from stand:  with min guard assist  with minimal assist Goal: Pt Will Transfer Bed To Chair/Chair To Bed Outcome: Progressing Flowsheets (Taken 06/18/2022 1225) Pt will Transfer Bed to Chair/Chair to Bed: with min assist Goal: Pt Will Ambulate Outcome: Progressing Flowsheets (Taken 06/18/2022 1225) Pt will Ambulate:  50 feet  with min guard assist  with minimal assist  with rolling walker Goal: Pt/caregiver will Perform Home Exercise Program Outcome: Progressing Flowsheets (Taken 06/18/2022 1225) Pt/caregiver will Perform Home Exercise Program:  For increased strengthening  For improved balance  With Supervision, verbal cues required/provided  For increased ROM  12:26 PM, 06/18/22 Mearl Latin PT, DPT Physical Therapist at [facility]

## 2022-06-18 NOTE — TOC Progression Note (Signed)
Transition of Care [facility]) - Progression Note    Patient Details  Name: Darius Smith MRN: [id number] Date of Birth: 05/20/1946  [facility] CM/SW Contact  [name], LCSW Phone Number: 06/18/2022, 12:35 PM  Clinical Narrative:    Patient expected to d/c back to [facility] over weekend. Spouse is agreeable to return to [facility]. Auth started. Attending notified of covid test needed to return and agreeable to place order.     Expected Discharge Plan: [place] Barriers to Discharge: Continued Medical Work up  Expected Discharge Plan and Services Expected Discharge Plan: [place]                                               Social Determinants of Health (SDOH) Interventions Housing Interventions: Intervention Not Indicated  Readmission Risk Interventions    08/04/2020    4:53 PM 08/01/2020    9:34 AM  Readmission Risk Prevention Plan  Transportation Screening Complete Complete  HRI or Home Care Consult Complete Complete  Social Work Consult for [place] Planning/Counseling Complete Complete  Palliative Care Screening Complete Not Applicable  Medication Review Press photographer) Complete Complete

## 2022-06-18 NOTE — Care Management Important Message (Signed)
Important Message  Patient Details  Name: Darius Smith MRN: [id number] Date of Birth: [birth year]   Medicare Important Message Given:  Yes     Tommy Medal 06/18/2022, 11:17 AM

## 2022-06-18 NOTE — Progress Notes (Addendum)
PROGRESS NOTE  Darius Smith  HQI:[id number] DOB: [birth year] DOA: 06/16/2022 PCP: [name], MD   Brief Narrative:  Patient is a 76 year old male with history of CKD stage 3b, COPD, combined systolic/diastolic CHF, A-fib, lung cancer with brain mets, Hodgkin's lymphoma who was brought to the emergency department from pain center after he passed out.  As per the report, he felt dizzy, passed out transiently.  He was reporting intermittent dizziness over the last few days, poor oral intake.  Also reported persistent pain on the left hip where  surgery was done.  He was recently hospitalized here and was discharged on 9/25 after being managed for left femur fracture status post ORIF by Dr. Amedeo Kinsman.  On presentation, he was hypotensive, given IV fluids.  Hemoglobin was 8.1.  Creatinine was 3.1, sodium of 129.  Patient was admitted for the management of acute kidney injury on CKD, hypotension, anemia.  Hospital course also remarkable for progressive thrombocytopenia.  Hemoglobin/oncology consulted today.   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  Hypotensive on presentation.  Blood pressure stable now.  Home antihypertensives: Norvasc, hydralazine, Imdur, Lasix on hold.  Continue gentle IV fluids  AKI on CKD stage IIIb: Baseline creatinine ranges from 1.5-1.8.  Presented with creatinine in the range of 3.1.  Currently  kidney function improving with gentle IV fluid.  Lasix on hold.  Can continue gentle IV fluids till tomorrow.  Acute blood loss anemia/pancytopenia: Preop hemoglobin was 11.9, hemoglobin was 8.1 on presentation.  Given a unit of blood transfusion.  FOBT negative.  He was on Eliquis.  This is most likely from local bleeding at the surgical site.  He had a palpable swelling on the lateral side of the left hip near surgical area.  FOBT negative. His chronic anemia is most likely secondary to chemotherapy for NHL.  Thrombocytopenia: Unclear reason.  Not on heparin or antibiotics.  Hematology/oncology consulted, Dr. Delton Coombes following.  Check CBC tomorrow  Chronic combined systolic/diastolic CHF: Not volume overloaded.  Last echo on 3/22 showed EF of 13%, grade 1 diastolic dysfunction.  He is being  given gentle IV fluids  Permanent A-fib:  Eliquis on hold.  Continue amiodarone.  Not on any rate controlling medication at baseline. noted to be in A-fib with RVR this morning with heart rate ranging from 100-120.  We will introduce low-dose metoprolol.  If blood pressure drops, will discontinue  Hypertension:   Antihypertensives on hold   History of non-Hodgkin's lymphoma: Follows with oncology at [facility].  History of mets to brain.  Was on chemo, Revlimid  Diabetes type 2: Recent A1c of 5.  Not on medication.  Monitor blood sugars  Recent left hip fracture: History of comminuted intertrochanteric fracture of left femur.  Status post ORIF on 9/22.  Wound looks clean but was suspected to have intramuscular hematoma.CT hip showed slight finding of hematoma but negative for large hematoma or bleeding  DVT prophylaxis:SCDs Start: 06/16/22 1737     Code Status: DNR  Family Communication: Family at bedside  Patient status:Inpatient  Patient is from :[facility]  Anticipated discharge to:SNF  Estimated DC date:1-2 days.  Needs to see improvement in the platelet level before  discharge   Consultants: None  Procedures:None  Antimicrobials:  Anti-infectives (From admission, onward)    None       Subjective: Patient seen and examined at the bedside today.  Hemodynamically stable.  Blood pressure better today.  He denies any New complaints.  Lying in bed, comfortable.  Objective: Vitals:   06/17/22 0357 06/17/22 1410 06/17/22 2203 06/18/22 0530  BP: 92/64 101/65 105/79 125/73  Pulse: 99 99 85 (!) 108  Resp: 20 20    Temp: 98.3 F (36.8 C) 98.4 F (36.9 C) 99.9 F (37.7 C) 99.2 F (37.3 C)  TempSrc:  Oral Oral Oral  SpO2: 97% 96% 94% 96%  Weight:      Height:        Intake/Output Summary (Last 24 hours) at 06/18/2022 1148 Last data filed at 06/18/2022 1100 Gross per 24 hour  Intake 688.66 ml  Output 2950 ml  Net -2261.34 ml   Filed Weights   06/16/22 1253 06/16/22 1645  Weight: 78.5 kg 80.3 kg    Examination: General exam: Overall comfortable, not in distress, deconditioned, chronically ill looking HEENT: PERRL Respiratory system:  no wheezes or crackles  Cardiovascular system: S1 & S2 heard, RRR.  Gastrointestinal system: Abdomen is nondistended, soft and nontender. Central nervous system: Alert and oriented Extremities: No edema, no clubbing ,no cyanosis, surgical wound  the left hip Skin: No rashes, no ulcers,no icterus      Data Reviewed: I have personally reviewed following labs and imaging studies  CBC: Recent Labs  Lab 06/14/22 0647 06/16/22 0530 06/16/22 1323 06/17/22 0430 06/18/22 0633  WBC 8.7 5.2 5.4 3.4* 2.9*  NEUTROABS  --   --  4.2  --   --   HGB 8.1* 7.4* 8.1* 8.5* 8.5*  HCT 24.3* 22.0* 24.2* 25.4* 25.9*  MCV 95.3 94.8 94.9 94.8 96.3  PLT 76* 54* 59* 50* 42*   Basic Metabolic Panel: Recent Labs  Lab 06/12/22 0624 06/13/22 0534 06/14/22 0647 06/16/22 1323 06/17/22 0430 06/18/22 0633  NA 136 135 135 129* 132* 138  K 4.1 3.9 4.0 3.7 3.8 3.7  CL 106 102 102 98 107 111  CO2 23 24 26  19* 19* 20*   GLUCOSE 148* 107* 127* 153* 109* 115*  BUN 25* 28* 28* 68* 63* 41*  CREATININE 1.76* 1.58* 1.72* 3.16* 2.49* 1.92*  CALCIUM 8.3* 8.2* 8.2* 8.0* 7.5* 7.7*  MG 2.0 1.9 1.9 2.2  --   --      Recent Results (from the past 240 hour(s))  Surgical PCR screen     Status: None   Collection Time: 06/10/22  8:31 PM   Specimen: Nasal Mucosa; Nasal Swab  Result Value Ref Range Status   MRSA, PCR NEGATIVE NEGATIVE Final   Staphylococcus aureus NEGATIVE NEGATIVE Final    Comment: (NOTE) The Xpert SA Assay (FDA approved for NASAL specimens in patients 52 years of age and older), is one component of a comprehensive surveillance program. It is not intended to diagnose infection nor to guide or monitor treatment. Performed at [facility], [address], [place], [place] [address]   SARS Coronavirus 2 by RT PCR (hospital order, performed in [facility] hospital lab) *cepheid single result test* Anterior Nasal Swab  Status: None   Collection Time: 06/14/22 10:42 AM   Specimen: Anterior Nasal Swab  Result Value Ref Range Status   SARS Coronavirus 2 by RT PCR NEGATIVE NEGATIVE Final    Comment: (NOTE) SARS-CoV-2 target nucleic acids are NOT DETECTED.  The SARS-CoV-2 RNA is generally detectable in upper and lower respiratory specimens during the acute phase of infection. The lowest concentration of SARS-CoV-2 viral copies this assay can detect is 250 copies / mL. A negative result does not preclude SARS-CoV-2 infection and should not be used as the sole basis for treatment or other patient management decisions.  A negative result may occur with improper specimen collection / handling, submission of specimen other than nasopharyngeal swab, presence of viral mutation(s) within the areas targeted by this assay, and inadequate number of viral copies (<250 copies / mL). A negative result must be combined with clinical observations, patient history, and epidemiological information.  Fact Sheet for  Patients:   https://www.patel.info/  Fact Sheet for Healthcare Providers: https://hall.com/  This test is not yet approved or  cleared by the [place] FDA and has been authorized for detection and/or diagnosis of SARS-CoV-2 by FDA under an Emergency Use Authorization (EUA).  This EUA will remain in effect (meaning this test can be used) for the duration of the COVID-19 declaration under Section 564(b)(1) of the Act, 21 U.S.C. section 360bbb-3(b)(1), unless the authorization is terminated or revoked sooner.  Performed at [facility], [address], [place], [place] [address]      Radiology Studies: CT HIP LEFT WO CONTRAST  Result Date: 06/16/2022 CLINICAL DATA:  Persistent left hip pain. Recent hip fracture and internal fixation. EXAM: CT OF THE LEFT HIP WITHOUT CONTRAST TECHNIQUE: Multidetector CT imaging of the left hip was performed according to the standard protocol. Multiplanar CT image reconstructions were also generated. RADIATION DOSE REDUCTION: This exam was performed according to the departmental dose-optimization program which includes automated exposure control, adjustment of the mA and/or kV according to patient size and/or use of iterative reconstruction technique. COMPARISON:  Radiographs 06/11/2022 FINDINGS: Intramedullary gamma nail in the femur with a dynamic hip screw transfixing the complex comminuted intertrochanteric fracture. The position and alignment appear good and stable. I do not see any complicating features associated with the hardware. No acetabular fracture. The left pubic bones are intact. The pubic symphysis and left SI joint are intact. No definite sacral fractures. Evidence of a remote/chronic tear of the gluteus minimus muscle which demonstrates fatty atrophy and calcifications. I do not see a large intramuscular hematoma or subcutaneous hematoma. Expected inflammation in the muscles surrounding the hip and some  scattered fluid/hematoma. No significant left-sided intrapelvic abnormalities. IMPRESSION: 1. Intramedullary gamma nail in the femur with a dynamic hip screw transfixing the complex comminuted intertrochanteric fracture. The position and alignment appear good and stable. No complicating features associated with the hardware. 2. Expected inflammation in the muscles surrounding the hip and some scattered fluid/hematoma. No large intramuscular hematoma or subcutaneous hematoma. 3. Remote/chronic tear of the gluteus minimus muscle which demonstrates fatty atrophy and calcifications. Electronically Signed   By: Marijo Sanes M.D.   On: 06/16/2022 18:11    Scheduled Meds:  amiodarone  200 mg Oral Daily   ferrous sulfate  325 mg Oral Q breakfast   Continuous Infusions:  sodium chloride 100 mL/hr at 06/18/22 0010     LOS: 1 day   Shelly Coss, MD [facility] P9/29/2023, 11:48 AM

## 2022-06-19 DIAGNOSIS — C8258 Diffuse follicle center lymphoma, lymph nodes of multiple sites: Secondary | ICD-10-CM

## 2022-06-19 DIAGNOSIS — I9589 Other hypotension: Secondary | ICD-10-CM | POA: Diagnosis not present

## 2022-06-19 DIAGNOSIS — I482 Chronic atrial fibrillation, unspecified: Secondary | ICD-10-CM | POA: Diagnosis not present

## 2022-06-19 DIAGNOSIS — D649 Anemia, unspecified: Secondary | ICD-10-CM | POA: Diagnosis not present

## 2022-06-19 DIAGNOSIS — S72142G Displaced intertrochanteric fracture of left femur, subsequent encounter for closed fracture with delayed healing: Secondary | ICD-10-CM

## 2022-06-19 DIAGNOSIS — N179 Acute kidney failure, unspecified: Secondary | ICD-10-CM | POA: Diagnosis not present

## 2022-06-19 LAB — GLUCOSE, CAPILLARY
Glucose-Capillary: 103 mg/dL — ABNORMAL HIGH (ref 70–99)
Glucose-Capillary: 118 mg/dL — ABNORMAL HIGH (ref 70–99)

## 2022-06-19 LAB — BASIC METABOLIC PANEL
Anion gap: 5 (ref 5–15)
BUN: 38 mg/dL — ABNORMAL HIGH (ref 8–23)
CO2: 21 mmol/L — ABNORMAL LOW (ref 22–32)
Calcium: 7.7 mg/dL — ABNORMAL LOW (ref 8.9–10.3)
Chloride: 111 mmol/L (ref 98–111)
Creatinine, Ser: 1.7 mg/dL — ABNORMAL HIGH (ref 0.61–1.24)
GFR, Estimated: 41 mL/min — ABNORMAL LOW (ref >60)
Glucose, Bld: 106 mg/dL — ABNORMAL HIGH (ref 70–99)
Potassium: 3.8 mmol/L (ref 3.5–5.1)
Sodium: 137 mmol/L (ref 135–145)

## 2022-06-19 LAB — CBC
HCT: 23.6 % — ABNORMAL LOW (ref 39.0–52.0)
Hemoglobin: 7.8 g/dL — ABNORMAL LOW (ref 13.0–17.0)
MCH: 31.8 pg (ref 26.0–34.0)
MCHC: 33.1 g/dL (ref 30.0–36.0)
MCV: 96.3 fL (ref 80.0–100.0)
Platelets: 37 10*3/uL — ABNORMAL LOW (ref 150–400)
RBC: 2.45 MIL/uL — ABNORMAL LOW (ref 4.22–5.81)
RDW: 14.6 % (ref 11.5–15.5)
WBC: 2.9 10*3/uL — ABNORMAL LOW (ref 4.0–10.5)
nRBC: 0 % (ref 0.0–0.2)

## 2022-06-19 MED ORDER — SODIUM CHLORIDE 0.9 % IV SOLN: INTRAVENOUS | Status: DC

## 2022-06-19 NOTE — Progress Notes (Signed)
PROGRESS NOTE    Darius Smith  NOM:[id number] DOB: 1946/06/04 DOA: 06/16/2022 PCP: [name], MD   Brief Narrative:  Patient is a 76 year old male with history of CKD stage 3b, COPD, combined systolic/diastolic CHF, A-fib, lung cancer with brain mets, Hodgkin's lymphoma who was brought to the emergency department from pain center after he passed out.  As per the report, he felt dizzy, passed out transiently.  He was reporting intermittent dizziness over the last few days, poor oral intake.  Also reported persistent pain on the left hip where  surgery was done.  He was recently hospitalized here and was discharged on 9/25 after being managed for left femur fracture status post ORIF by Dr. Amedeo Kinsman.  On presentation, he was hypotensive, given IV fluids.  Hemoglobin was 8.1.  Creatinine was 3.1, sodium of 129.  Patient was admitted for the management of acute kidney injury on CKD, hypotension, anemia.  Hospital course also remarkable for progressive thrombocytopenia.  Hemoglobin/oncology consulted.  Assessment & Plan:   Hypotension syncope:  - Blood pressure stable now.  Home antihypertensives: Norvasc, hydralazine, Imdur, Lasix on hold.  Continue gentle IV fluids   AKI on CKD stage IIIb: Baseline creatinine ranges from 1.5-1.8.  Presented with creatinine in the range of 3.1.  Currently kidney function improving with gentle IV fluid.  Lasix on hold.    Acute blood loss anemia pancytopenia: Preop hemoglobin was 11.9, hemoglobin was 8.1 on presentation. -Given a unit of blood transfusion.  FOBT negative.   -His chronic anemia is most likely secondary to chemotherapy for NHL. -We will continue to monitor hemoglobin closely and transfuse as needed   Thrombocytopenia: Unclear reason.  Not on heparin or antibiotics.  -Hematology/oncology consulted, Dr. Delton Coombes following-appreciate help -B12, folate, LDH: WNL, methylmalonic acid: Pending   Chronic combined systolic/diastolic CHF: Not volume  overloaded.  Last echo on 3/22 showed EF of 96%, grade 1 diastolic dysfunction.  He is being  given gentle IV fluids -Strict INO's and daily weight.  Monitor signs of fluid overload   Permanent A-fib:  Eliquis on hold.  Continue amiodarone.   -Not on any rate controlling medication at baseline. was noted to be in A-fib with RVR on 06/18/1929 and started on low-dose metoprolol.  If blood pressure drops, will discontinue   Hypertension: On metoprolol   History of non-Hodgkin's lymphoma with CNS involvement:  - Last PET scan and MRI of the brain on 06/02/2022 showed complete response to therapy.Followed by heme oncology outpatient  Diabetes type 2: Recent A1c of 5.  Not on medication.  Monitor blood sugars   Recent left hip fracture: History of comminuted intertrochanteric fracture of left femur.  Status post ORIF on 9/22.  Wound looks clean but was suspected to have intramuscular hematoma.CT hip showed slight finding of hematoma but negative for large hematoma or bleeding -Patient will be discharged back to nursing home   DVT prophylaxis: SCD Code Status: DNR Family Communication:  None present at bedside.  Plan of care discussed with patient in length and he verbalized understanding and agreed with it. Disposition Plan: To be determined  Consultants:  Heme oncology  Procedures:  None  Antimicrobials:  None  Status is: Inpatient   Subjective: Patient seen and examined.  Tells me that he is doing fine, he denies any new complaints.  No acute events overnight.  Remained afebrile.  He denies any bleeding from any site of body.  Objective: Vitals:   06/18/22 2045 06/18/22 2108 06/19/22 0509 06/19/22  0854  BP: (!) 113/56 118/84 114/74 120/86  Pulse: 98 (!) 106 100 100  Resp: 18  16   Temp: 99.8 F (37.7 C)  98.4 F (36.9 C)   TempSrc: Oral  Oral   SpO2: 98%  98% 99%  Weight:      Height:        Intake/Output Summary (Last 24 hours) at 06/19/2022 1342 Last data filed at  06/19/2022 0657 Gross per 24 hour  Intake 240 ml  Output 1300 ml  Net -1060 ml   Filed Weights   06/16/22 1253 06/16/22 1645  Weight: 78.5 kg 80.3 kg    Examination:  General exam: Appears calm and comfortable, on room air, communicating well, appears chronically ill and deconditioned Respiratory system: Clear to auscultation. Respiratory effort normal. Cardiovascular system: S1 & S2 heard, RRR. No JVD, murmurs, rubs, gallops or clicks. No pedal edema. Gastrointestinal system: Abdomen is nondistended, soft and nontender. No organomegaly or masses felt. Normal bowel sounds heard. Central nervous system: Alert and oriented. No focal neurological deficits. Extremities: dressing on left hip  skin: No rashes, lesions or ulcers Psychiatry: Judgement and insight appear normal. Mood & affect appropriate.    Data Reviewed: I have personally reviewed following labs and imaging studies  CBC: Recent Labs  Lab 06/16/22 0530 06/16/22 1323 06/17/22 0430 06/18/22 0633 06/19/22 0514  WBC 5.2 5.4 3.4* 2.9* 2.9*  NEUTROABS  --  4.2  --   --   --   HGB 7.4* 8.1* 8.5* 8.5* 7.8*  HCT 22.0* 24.2* 25.4* 25.9* 23.6*  MCV 94.8 94.9 94.8 96.3 96.3  PLT 54* 59* 50* 42* 37*   Basic Metabolic Panel: Recent Labs  Lab 06/13/22 0534 06/14/22 0647 06/16/22 1323 06/17/22 0430 06/18/22 0633 06/19/22 0514  NA 135 135 129* 132* 138 137  K 3.9 4.0 3.7 3.8 3.7 3.8  CL 102 102 98 107 111 111  CO2 24 26 19* 19* 20* 21*  GLUCOSE 107* 127* 153* 109* 115* 106*  BUN 28* 28* 68* 63* 41* 38*  CREATININE 1.58* 1.72* 3.16* 2.49* 1.92* 1.70*  CALCIUM 8.2* 8.2* 8.0* 7.5* 7.7* 7.7*  MG 1.9 1.9 2.2  --   --   --    GFR: Estimated Creatinine Clearance: 40.6 mL/min (A) (by C-G formula based on SCr of 1.7 mg/dL (H)). Liver Function Tests: Recent Labs  Lab 06/16/22 1323  AST 20  ALT 16  ALKPHOS 48  BILITOT 1.4*  PROT 6.0*  ALBUMIN 2.5*   No results for input(s): "LIPASE", "AMYLASE" in the last 168  hours. No results for input(s): "AMMONIA" in the last 168 hours. Coagulation Profile: Recent Labs  Lab 06/16/22 1323  INR 2.4*   Cardiac Enzymes: No results for input(s): "CKTOTAL", "CKMB", "CKMBINDEX", "TROPONINI" in the last 168 hours. BNP (last 3 results) No results for input(s): "PROBNP" in the last 8760 hours. HbA1C: No results for input(s): "HGBA1C" in the last 72 hours. CBG: Recent Labs  Lab 06/17/22 0750 06/17/22 1107 06/17/22 1557 06/18/22 0528 06/19/22 0516  GLUCAP 109* 122* 134* 112* 103*   Lipid Profile: No results for input(s): "CHOL", "HDL", "LDLCALC", "TRIG", "CHOLHDL", "LDLDIRECT" in the last 72 hours. Thyroid Function Tests: No results for input(s): "TSH", "T4TOTAL", "FREET4", "T3FREE", "THYROIDAB" in the last 72 hours. Anemia Panel: Recent Labs    06/18/22 1623  VITAMINB12 212  FOLATE 8.8  RETICCTPCT 0.8   Sepsis Labs: No results for input(s): "PROCALCITON", "LATICACIDVEN" in the last 168 hours.  Recent Results (from the  past 240 hour(s))  Surgical PCR screen     Status: None   Collection Time: 06/10/22  8:31 PM   Specimen: Nasal Mucosa; Nasal Swab  Result Value Ref Range Status   MRSA, PCR NEGATIVE NEGATIVE Final   Staphylococcus aureus NEGATIVE NEGATIVE Final    Comment: (NOTE) The Xpert SA Assay (FDA approved for NASAL specimens in patients 17 years of age and older), is one component of a comprehensive surveillance program. It is not intended to diagnose infection nor to guide or monitor treatment. Performed at [facility], [address], [place], [place] [address]   SARS Coronavirus 2 by RT PCR (hospital order, performed in [facility] hospital lab) *cepheid single result test* Anterior Nasal Swab     Status: None   Collection Time: 06/14/22 10:42 AM   Specimen: Anterior Nasal Swab  Result Value Ref Range Status   SARS Coronavirus 2 by RT PCR NEGATIVE NEGATIVE Final    Comment: (NOTE) SARS-CoV-2 target nucleic acids are NOT  DETECTED.  The SARS-CoV-2 RNA is generally detectable in upper and lower respiratory specimens during the acute phase of infection. The lowest concentration of SARS-CoV-2 viral copies this assay can detect is 250 copies / mL. A negative result does not preclude SARS-CoV-2 infection and should not be used as the sole basis for treatment or other patient management decisions.  A negative result may occur with improper specimen collection / handling, submission of specimen other than nasopharyngeal swab, presence of viral mutation(s) within the areas targeted by this assay, and inadequate number of viral copies (<250 copies / mL). A negative result must be combined with clinical observations, patient history, and epidemiological information.  Fact Sheet for Patients:   https://www.patel.info/  Fact Sheet for Healthcare Providers: https://hall.com/  This test is not yet approved or  cleared by the [place] FDA and has been authorized for detection and/or diagnosis of SARS-CoV-2 by FDA under an Emergency Use Authorization (EUA).  This EUA will remain in effect (meaning this test can be used) for the duration of the COVID-19 declaration under Section 564(b)(1) of the Act, 21 U.S.C. section 360bbb-3(b)(1), unless the authorization is terminated or revoked sooner.  Performed at [facility], [address], [place], [place] [address]   SARS Coronavirus 2 by RT PCR (hospital order, performed in [facility] hospital lab) *cepheid single result test* Anterior Nasal Swab     Status: None   Collection Time: 06/18/22  2:03 PM   Specimen: Anterior Nasal Swab  Result Value Ref Range Status   SARS Coronavirus 2 by RT PCR NEGATIVE NEGATIVE Final    Comment: (NOTE) SARS-CoV-2 target nucleic acids are NOT DETECTED.  The SARS-CoV-2 RNA is generally detectable in upper and lower respiratory specimens during the acute phase of infection. The  lowest concentration of SARS-CoV-2 viral copies this assay can detect is 250 copies / mL. A negative result does not preclude SARS-CoV-2 infection and should not be used as the sole basis for treatment or other patient management decisions.  A negative result may occur with improper specimen collection / handling, submission of specimen other than nasopharyngeal swab, presence of viral mutation(s) within the areas targeted by this assay, and inadequate number of viral copies (<250 copies / mL). A negative result must be combined with clinical observations, patient history, and epidemiological information.  Fact Sheet for Patients:   https://www.patel.info/  Fact Sheet for Healthcare Providers: https://hall.com/  This test is not yet approved or  cleared by the [place]  FDA and has been authorized for detection and/or diagnosis of SARS-CoV-2 by FDA under an Emergency Use Authorization (EUA).  This EUA will remain in effect (meaning this test can be used) for the duration of the COVID-19 declaration under Section 564(b)(1) of the Act, 21 U.S.C. section 360bbb-3(b)(1), unless the authorization is terminated or revoked sooner.  Performed at [facility], [address], [place], [place] [address]       Radiology Studies: No results found.  Scheduled Meds:  amiodarone  200 mg Oral Daily   ferrous sulfate  325 mg Oral Q breakfast   metoprolol tartrate  12.5 mg Oral BID   Continuous Infusions:   LOS: 2 days   Time spent: 35 minutes   Joyleen Haselton Loann Quill, MD [facility]  If 7PM-7AM, please contact night-coverage [email or web address] 06/19/2022, 1:42 PM

## 2022-06-20 DIAGNOSIS — N179 Acute kidney failure, unspecified: Secondary | ICD-10-CM | POA: Diagnosis not present

## 2022-06-20 DIAGNOSIS — S72142G Displaced intertrochanteric fracture of left femur, subsequent encounter for closed fracture with delayed healing: Secondary | ICD-10-CM

## 2022-06-20 DIAGNOSIS — I9589 Other hypotension: Secondary | ICD-10-CM | POA: Diagnosis not present

## 2022-06-20 DIAGNOSIS — I482 Chronic atrial fibrillation, unspecified: Secondary | ICD-10-CM | POA: Diagnosis not present

## 2022-06-20 DIAGNOSIS — D649 Anemia, unspecified: Secondary | ICD-10-CM | POA: Diagnosis not present

## 2022-06-20 LAB — URINALYSIS, ROUTINE W REFLEX MICROSCOPIC
Bilirubin Urine: NEGATIVE
Glucose, UA: NEGATIVE mg/dL
Ketones, ur: NEGATIVE mg/dL
Nitrite: NEGATIVE
Protein, ur: 30 mg/dL — AB
Specific Gravity, Urine: 1.014 (ref 1.005–1.030)
WBC, UA: >50 WBC/hpf — ABNORMAL HIGH (ref 0–5)
pH: 5 (ref 5.0–8.0)

## 2022-06-20 LAB — GLUCOSE, CAPILLARY: Glucose-Capillary: 112 mg/dL — ABNORMAL HIGH (ref 70–99)

## 2022-06-20 LAB — MAGNESIUM: Magnesium: 1.9 mg/dL (ref 1.7–2.4)

## 2022-06-20 LAB — CBC
HCT: 22.5 % — ABNORMAL LOW (ref 39.0–52.0)
Hemoglobin: 7.5 g/dL — ABNORMAL LOW (ref 13.0–17.0)
MCH: 32.3 pg (ref 26.0–34.0)
MCHC: 33.3 g/dL (ref 30.0–36.0)
MCV: 97 fL (ref 80.0–100.0)
Platelets: 43 10*3/uL — ABNORMAL LOW (ref 150–400)
RBC: 2.32 MIL/uL — ABNORMAL LOW (ref 4.22–5.81)
RDW: 14.7 % (ref 11.5–15.5)
WBC: 3.3 10*3/uL — ABNORMAL LOW (ref 4.0–10.5)
nRBC: 0 % (ref 0.0–0.2)

## 2022-06-20 LAB — BASIC METABOLIC PANEL
Anion gap: 6 (ref 5–15)
BUN: 43 mg/dL — ABNORMAL HIGH (ref 8–23)
CO2: 18 mmol/L — ABNORMAL LOW (ref 22–32)
Calcium: 7.1 mg/dL — ABNORMAL LOW (ref 8.9–10.3)
Chloride: 110 mmol/L (ref 98–111)
Creatinine, Ser: 1.71 mg/dL — ABNORMAL HIGH (ref 0.61–1.24)
GFR, Estimated: 41 mL/min — ABNORMAL LOW (ref >60)
Glucose, Bld: 107 mg/dL — ABNORMAL HIGH (ref 70–99)
Potassium: 3.7 mmol/L (ref 3.5–5.1)
Sodium: 134 mmol/L — ABNORMAL LOW (ref 135–145)

## 2022-06-20 NOTE — Progress Notes (Signed)
PROGRESS NOTE    Darius Smith  ZTI:[id number] DOB: 1945-10-22 DOA: 06/16/2022 PCP: [name], MD   Brief Narrative:  Patient is a 76 year old male with history of CKD stage 3b, COPD, combined systolic/diastolic CHF, A-fib, lung cancer with brain mets, Hodgkin's lymphoma who was brought to the emergency department from pain center after he passed out.  As per the report, he felt dizzy, passed out transiently.  He was reporting intermittent dizziness over the last few days, poor oral intake.  Also reported persistent pain on the left hip where  surgery was done.  He was recently hospitalized here and was discharged on 9/25 after being managed for left femur fracture status post ORIF by Dr. Amedeo Kinsman.  On presentation, he was hypotensive, given IV fluids.  Hemoglobin was 8.1.  Creatinine was 3.1, sodium of 129.  Patient was admitted for the management of acute kidney injury on CKD, hypotension, anemia.  Hospital course also remarkable for progressive thrombocytopenia.  Hemoglobin/oncology consulted.  Assessment & Plan:   Hypotension syncope:  - Blood pressure stable now however still soft.  Home antihypertensives: Norvasc, hydralazine, Imdur, Lasix on hold.  Continue gentle IV fluids   AKI on CKD stage IIIb: Baseline creatinine ranges from 1.5-1.8.  Presented with creatinine in the range of 3.1.  Currently kidney function improving with gentle IV fluid.  Lasix on hold.    Acute blood loss anemia pancytopenia: Preop hemoglobin was 11.9, hemoglobin was 8.1 on presentation. -Given a unit of blood transfusion.  FOBT negative.   -Has chronic anemia is most likely secondary to chemotherapy for NHL. -We will continue to monitor hemoglobin closely and transfuse as needed   Thrombocytopenia: Unclear reason.  Not on heparin or antibiotics.   -Hematology/oncology- Dr. Delton Coombes following-appreciate help -B12, folate, LDH: WNL, methylmalonic acid: Pending   Chronic combined systolic/diastolic CHF:  Not volume overloaded.  Last echo on 3/22 showed EF of 82%, grade 1 diastolic dysfunction.  He is being  given gentle IV fluids -Strict INO's and daily weight.  Monitor signs of fluid overload   Permanent A-fib:  Eliquis on hold.  Continue amiodarone.   -Not on any rate controlling medication at baseline. was noted to be in A-fib with RVR on 06/18/1929 and started on low-dose metoprolol-discontinued on 10/1 due to soft BP  history of non-Hodgkin's lymphoma with CNS involvement:  - Last PET scan and MRI of the brain on 06/02/2022 showed complete response to therapy.Followed by heme oncology outpatient  Diabetes type 2: Recent A1c of 5.  Not on medication.  Monitor blood sugars   Recent left hip fracture: History of comminuted intertrochanteric fracture of left femur.  Status post ORIF on 9/22.  Wound looks clean but was suspected to have intramuscular hematoma.CT hip showed slight finding of hematoma but negative for large hematoma or bleeding -Patient will be discharged back to nursing home  Dark colored urine: Hematuria? -he has condom cathter. Some Blood noted on the cathter tube and in the urine container. Will check UA and will cont. To monitor HB.   DVT prophylaxis: SCD Code Status: DNR Family Communication:  None present at bedside.  Plan of care discussed with patient in length and he verbalized understanding and agreed with it. Disposition Plan: To be determined  Consultants:  Heme oncology  Procedures:  None  Antimicrobials:  None  Status is: Inpatient   Subjective: Patient seen and examined.  Resting comfortably on the bed.  He denies any complaints.  No acute events overnight.  On exam: Some blood noted on the condom catheter site/tube and some in urine container.  RN notified.she is going to change the cathter, tube and container will monitor closely.  Objective: Vitals:   06/19/22 0854 06/19/22 1444 06/19/22 2059 06/20/22 0513  BP: 120/86 (!) 96/58 105/73 107/71   Pulse: 100 66 74 85  Resp:  20 20 20   Temp:  98.7 F (37.1 C) (!) 97.4 F (36.3 C) 98.3 F (36.8 C)  TempSrc:  Oral Oral Oral  SpO2: 99% 95% 99% (!) 88%  Weight:      Height:        Intake/Output Summary (Last 24 hours) at 06/20/2022 1025 Last data filed at 06/20/2022 0313 Gross per 24 hour  Intake 936.95 ml  Output --  Net 936.95 ml    Filed Weights   06/16/22 1253 06/16/22 1645  Weight: 78.5 kg 80.3 kg    Examination:  General exam: Appears calm and comfortable, on room air, communicating well, appears chronically ill and deconditioned Respiratory system: Clear to auscultation. Respiratory effort normal. Cardiovascular system: S1 & S2 heard, RRR. No JVD, murmurs, rubs, gallops or clicks. No pedal edema. Gastrointestinal system: Abdomen is nondistended, soft and nontender. No organomegaly or masses felt. Normal bowel sounds heard. Central nervous system: Alert and oriented. No focal neurological deficits. Genitourinary: Scrotal is slightly swollen. Bed and perineal area is wet with the urine.  No blood noted on the tip of the penis.  No discharge seen Extremities: dressing on left hip.  Nontender. Psychiatry: Judgement and insight appear normal. Mood & affect appropriate.    Data Reviewed: I have personally reviewed following labs and imaging studies  CBC: Recent Labs  Lab 06/16/22 1323 06/17/22 0430 06/18/22 0633 06/19/22 0514 06/20/22 0611  WBC 5.4 3.4* 2.9* 2.9* 3.3*  NEUTROABS 4.2  --   --   --   --   HGB 8.1* 8.5* 8.5* 7.8* 7.5*  HCT 24.2* 25.4* 25.9* 23.6* 22.5*  MCV 94.9 94.8 96.3 96.3 97.0  PLT 59* 50* 42* 37* 43*    Basic Metabolic Panel: Recent Labs  Lab 06/14/22 0647 06/16/22 1323 06/17/22 0430 06/18/22 0633 06/19/22 0514  NA 135 129* 132* 138 137  K 4.0 3.7 3.8 3.7 3.8  CL 102 98 107 111 111  CO2 26 19* 19* 20* 21*  GLUCOSE 127* 153* 109* 115* 106*  BUN 28* 68* 63* 41* 38*  CREATININE 1.72* 3.16* 2.49* 1.92* 1.70*  CALCIUM 8.2* 8.0*  7.5* 7.7* 7.7*  MG 1.9 2.2  --   --   --     GFR: Estimated Creatinine Clearance: 40.6 mL/min (A) (by C-G formula based on SCr of 1.7 mg/dL (H)). Liver Function Tests: Recent Labs  Lab 06/16/22 1323  AST 20  ALT 16  ALKPHOS 48  BILITOT 1.4*  PROT 6.0*  ALBUMIN 2.5*    No results for input(s): "LIPASE", "AMYLASE" in the last 168 hours. No results for input(s): "AMMONIA" in the last 168 hours. Coagulation Profile: Recent Labs  Lab 06/16/22 1323  INR 2.4*    Cardiac Enzymes: No results for input(s): "CKTOTAL", "CKMB", "CKMBINDEX", "TROPONINI" in the last 168 hours. BNP (last 3 results) No results for input(s): "PROBNP" in the last 8760 hours. HbA1C: No results for input(s): "HGBA1C" in the last 72 hours. CBG: Recent Labs  Lab 06/17/22 1557 06/18/22 0528 06/19/22 0516 06/19/22 2106 06/20/22 0515  GLUCAP 134* 112* 103* 118* 112*    Lipid Profile: No results for input(s): "CHOL", "HDL", "LDLCALC", "  TRIG", "CHOLHDL", "LDLDIRECT" in the last 72 hours. Thyroid Function Tests: No results for input(s): "TSH", "T4TOTAL", "FREET4", "T3FREE", "THYROIDAB" in the last 72 hours. Anemia Panel: Recent Labs    06/18/22 1623  VITAMINB12 212  FOLATE 8.8  RETICCTPCT 0.8    Sepsis Labs: No results for input(s): "PROCALCITON", "LATICACIDVEN" in the last 168 hours.  Recent Results (from the past 240 hour(s))  Surgical PCR screen     Status: None   Collection Time: 06/10/22  8:31 PM   Specimen: Nasal Mucosa; Nasal Swab  Result Value Ref Range Status   MRSA, PCR NEGATIVE NEGATIVE Final   Staphylococcus aureus NEGATIVE NEGATIVE Final    Comment: (NOTE) The Xpert SA Assay (FDA approved for NASAL specimens in patients 3 years of age and older), is one component of a comprehensive surveillance program. It is not intended to diagnose infection nor to guide or monitor treatment. Performed at [facility], [address], [place], [place] [address]   SARS Coronavirus 2 by RT  PCR (hospital order, performed in [facility] hospital lab) *cepheid single result test* Anterior Nasal Swab     Status: None   Collection Time: 06/14/22 10:42 AM   Specimen: Anterior Nasal Swab  Result Value Ref Range Status   SARS Coronavirus 2 by RT PCR NEGATIVE NEGATIVE Final    Comment: (NOTE) SARS-CoV-2 target nucleic acids are NOT DETECTED.  The SARS-CoV-2 RNA is generally detectable in upper and lower respiratory specimens during the acute phase of infection. The lowest concentration of SARS-CoV-2 viral copies this assay can detect is 250 copies / mL. A negative result does not preclude SARS-CoV-2 infection and should not be used as the sole basis for treatment or other patient management decisions.  A negative result may occur with improper specimen collection / handling, submission of specimen other than nasopharyngeal swab, presence of viral mutation(s) within the areas targeted by this assay, and inadequate number of viral copies (<250 copies / mL). A negative result must be combined with clinical observations, patient history, and epidemiological information.  Fact Sheet for Patients:   https://www.patel.info/  Fact Sheet for Healthcare Providers: https://hall.com/  This test is not yet approved or  cleared by the [place] FDA and has been authorized for detection and/or diagnosis of SARS-CoV-2 by FDA under an Emergency Use Authorization (EUA).  This EUA will remain in effect (meaning this test can be used) for the duration of the COVID-19 declaration under Section 564(b)(1) of the Act, 21 U.S.C. section 360bbb-3(b)(1), unless the authorization is terminated or revoked sooner.  Performed at [facility], [address], [place], [place] [address]   SARS Coronavirus 2 by RT PCR (hospital order, performed in [facility] hospital lab) *cepheid single result test* Anterior Nasal Swab     Status: None   Collection Time:  06/18/22  2:03 PM   Specimen: Anterior Nasal Swab  Result Value Ref Range Status   SARS Coronavirus 2 by RT PCR NEGATIVE NEGATIVE Final    Comment: (NOTE) SARS-CoV-2 target nucleic acids are NOT DETECTED.  The SARS-CoV-2 RNA is generally detectable in upper and lower respiratory specimens during the acute phase of infection. The lowest concentration of SARS-CoV-2 viral copies this assay can detect is 250 copies / mL. A negative result does not preclude SARS-CoV-2 infection and should not be used as the sole basis for treatment or other patient management decisions.  A negative result may occur with improper specimen collection / handling, submission of specimen other than nasopharyngeal swab,  presence of viral mutation(s) within the areas targeted by this assay, and inadequate number of viral copies (<250 copies / mL). A negative result must be combined with clinical observations, patient history, and epidemiological information.  Fact Sheet for Patients:   https://www.patel.info/  Fact Sheet for Healthcare Providers: https://hall.com/  This test is not yet approved or  cleared by the [place] FDA and has been authorized for detection and/or diagnosis of SARS-CoV-2 by FDA under an Emergency Use Authorization (EUA).  This EUA will remain in effect (meaning this test can be used) for the duration of the COVID-19 declaration under Section 564(b)(1) of the Act, 21 U.S.C. section 360bbb-3(b)(1), unless the authorization is terminated or revoked sooner.  Performed at [facility], [address], [place], [place] [address]       Radiology Studies: No results found.  Scheduled Meds:  amiodarone  200 mg Oral Daily   ferrous sulfate  325 mg Oral Q breakfast   metoprolol tartrate  12.5 mg Oral BID   Continuous Infusions:  sodium chloride 75 mL/hr at 06/20/22 0314     LOS: 3 days   Time spent: 35 minutes   Saleha Kalp Loann Quill,  MD [facility]  If 7PM-7AM, please contact night-coverage [email or web address] 06/20/2022, 10:25 AM

## 2022-06-21 ENCOUNTER — Encounter (HOSPITAL_COMMUNITY): Payer: Self-pay | Admitting: Orthopedic Surgery

## 2022-06-21 DIAGNOSIS — I9589 Other hypotension: Secondary | ICD-10-CM | POA: Diagnosis not present

## 2022-06-21 DIAGNOSIS — E861 Hypovolemia: Secondary | ICD-10-CM | POA: Diagnosis not present

## 2022-06-21 LAB — CBC
HCT: 22.9 % — ABNORMAL LOW (ref 39.0–52.0)
Hemoglobin: 7.7 g/dL — ABNORMAL LOW (ref 13.0–17.0)
MCH: 32.2 pg (ref 26.0–34.0)
MCHC: 33.6 g/dL (ref 30.0–36.0)
MCV: 95.8 fL (ref 80.0–100.0)
Platelets: 55 10*3/uL — ABNORMAL LOW (ref 150–400)
RBC: 2.39 MIL/uL — ABNORMAL LOW (ref 4.22–5.81)
RDW: 14.6 % (ref 11.5–15.5)
WBC: 3.4 10*3/uL — ABNORMAL LOW (ref 4.0–10.5)
nRBC: 0 % (ref 0.0–0.2)

## 2022-06-21 LAB — BASIC METABOLIC PANEL
Anion gap: 5 (ref 5–15)
BUN: 44 mg/dL — ABNORMAL HIGH (ref 8–23)
CO2: 18 mmol/L — ABNORMAL LOW (ref 22–32)
Calcium: 7.3 mg/dL — ABNORMAL LOW (ref 8.9–10.3)
Chloride: 112 mmol/L — ABNORMAL HIGH (ref 98–111)
Creatinine, Ser: 1.61 mg/dL — ABNORMAL HIGH (ref 0.61–1.24)
GFR, Estimated: 44 mL/min — ABNORMAL LOW (ref >60)
Glucose, Bld: 111 mg/dL — ABNORMAL HIGH (ref 70–99)
Potassium: 3.7 mmol/L (ref 3.5–5.1)
Sodium: 135 mmol/L (ref 135–145)

## 2022-06-21 LAB — URINALYSIS, MICROSCOPIC (REFLEX): WBC, UA: >50 WBC/hpf (ref 0–5)

## 2022-06-21 LAB — GLUCOSE, CAPILLARY: Glucose-Capillary: 112 mg/dL — ABNORMAL HIGH (ref 70–99)

## 2022-06-21 LAB — URINALYSIS, ROUTINE W REFLEX MICROSCOPIC
Bilirubin Urine: NEGATIVE
Glucose, UA: 100 mg/dL — AB
Ketones, ur: NEGATIVE mg/dL
Nitrite: NEGATIVE
Specific Gravity, Urine: 1.02 (ref 1.005–1.030)
pH: 6 (ref 5.0–8.0)

## 2022-06-21 LAB — SARS CORONAVIRUS 2 BY RT PCR: SARS Coronavirus 2 by RT PCR: NEGATIVE

## 2022-06-21 MED ORDER — POLYETHYLENE GLYCOL 3350 17 G PO PACK
17.0000 g | PACK | Freq: Every day | ORAL | Status: DC
  Administered 2022-06-21 – 2022-06-28 (×7): 17 g via ORAL
  Filled 2022-06-21 (×8): qty 1

## 2022-06-21 NOTE — Progress Notes (Signed)
PROGRESS NOTE Darius Smith  WCH:[id number] DOB: September 23, 1945 DOA: 06/16/2022 PCP: [name], MD   Brief Narrative/Hospital Course: 76 year old male with history of CKD stage 3b, COPD, combined systolic/diastolic CHF, A-fib, lung cancer with brain mets, Hodgkin's lymphoma who was brought to the emergency department from pain center after he passed out.  As per the report, he felt dizzy, passed out transiently.  He was reporting intermittent dizziness over the last few days, poor oral intake.  Also reported persistent pain on the left hip where  surgery was done.  He was recently hospitalized here and was discharged on 9/25 after being managed for left femur fracture status post ORIF by Dr. Amedeo Kinsman.  On presentation, he was hypotensive, given IV fluids.  Hemoglobin was 8.1.  Creatinine was 3.1, sodium of 129.  Patient was admitted for the management of acute kidney injury on CKD, hypotension, anemia.  Hospital course also remarkable for progressive thrombocytopenia.  Hemoglobin/oncology consulted. Patient was admitted for hypotension and syncope BP stabilized home antihypertensives held today with IV fluids, AKI on CKD stage IIIb improving from baseline creatinine of 1.5-1.8.  Creatinine improved to 1.6. Patient also anemia pancytopenia FOBT negative suspect anemia due to chemotherapy for NHL and anemia of chronic disease.  Hemoglobin overall stable, status post 1 unit PRBC transfusion.  B12 folate LDH normal discussed with Dr. Delton Coombes from heme-onc-unclear etiology of thrombocytopenia, platelet improved to 55, was as low as 37k. Other issues include recent left hip fracture/diabetes/NHL/permanent A-fib-for his Eliquis was held due to patient's anemia and thrombocytopenia, metoprolol discontinued due to soft BP on 10/1. Patient had dark-colored urine UA showed pyuria WBC more than 50 RBC 0-5, bacteria many, leukocytes moderate but at this time no fever no leukocytosis.    Subjective: Seen this am He  feels better overall ate well. He is saturating well on room air Voiding well, constipation for 3-4 days, passing gas.  He is wife is at the bedside.   Assessment and Plan: Principal Problem:   Hypotension Active Problems:   AKI (acute kidney injury) ([place])   Syncope   Closed comminuted intertrochanteric fracture of left femur (HCC)   Acute anemia   Essential hypertension   Atrial fibrillation (HCC)   Non Hodgkin's lymphoma (HCC)   Chronic combined systolic and diastolic CHF (congestive heart failure) (HCC)   Stage 3b chronic kidney disease ([place])   DNR (do not resuscitate) discussion   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[place])   Thrombocytopenia ([place])   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 ([place])  Hypotension Syncope: Patient presented with syncope in the setting of low blood pressure in 79/55- BP soft holding over 100, continue to hold antihypertensives.  Continue gentle IV hydration, oral intake stable.  Mobilize PT OT.  AKI on CKD stage IIIb baseline creatinine 1.5-1.8 on presentation 3.1 now nicely improved slowly wean down IV fluids as oral intake better, Lasix on hold.  Anemia normocytic likely multifactoria Acute blood loss anemia due to hematoma and any of chronic disease and chemotherapy for NHL: FOBT was negative.  S/P 1 unit PRBC.  Holding of 7 g.  Monitor  Recent Labs  Lab 06/17/22 0430 06/18/22 3536 06/19/22 0514 06/20/22 0611 06/21/22 0704  HGB 8.5* 8.5* 7.8* 7.5* 7.7*  HCT 25.4* 25.9* 23.6* 22.5* 22.9*   Thrombocytopenia/leukopenia unclear etiology B12 folate LDH normal. Hematology/oncology- Dr. Delton Coombes following-appreciate input.  MMA pending.  Monitor.  Trending up slowly. Recent Labs  Lab 06/17/22 0430 06/18/22 1443 06/19/22 1540 06/20/22 0867  06/21/22 0704  PLT 50* 42* 37* 43* 55*   Chronic combined systolic/diastolic CHF no obvious fluid overload last echo with EF 50% G1 DD in 2022.  On gentle IV fluids, weaning down.  Monitor  intake output. Net IO Since Admission: 113.82 mL [06/21/22 1305]   Permanent A-fib: Continue amiodarone.  Eliquis remains on hold due to patient's anemia.  Discussed with patient and his wife at the bedside who agree with holding for now will need to be reassessed by his cardiologist-followed by Dr. Harl Bowie.  A-fib with RVR on 9/29 and placed on low-dose metoprolol but discontinued due to soft BP 06/20/22  NHL w/ CNS involvement last PET scan and MRI of the brain on 06/02/2022 showed complete response to therapy.Followed by heme oncology outpatient   Type 2 diabetes A1c stable in 5,blood sugar control monitor Recent Labs  Lab 06/18/22 0528 06/19/22 0516 06/19/22 2106 06/20/22 0515 06/21/22 0358  GLUCAP 112* 103* 118* 112* 112*     History of comminuted intertrochanteric fracture of left femur ,s/p ORIF on 9/22.Wound looks clean but was suspected to have intramuscular hematoma.CT hip showed slight finding of hematoma but negative for large hematoma or bleeding.  Eliquis remains on hold.  Plan is for skilled nursing facility  Dark-colored urine:Patient had dark-colored urine UA showed pyuria WBC more than 50 RBC 0-5, bacteria many, leukocytes moderate but at this time no fever no leukocytosis.  Constipation no BM for 3 to 4 days start MiraLAX daily  DVT prophylaxis: SCDs Start: 06/16/22 1737 Code Status:   Code Status: DNR Family Communication: plan of care discussed with patient/wife at bedside. Patient status is: Inpatient because of ongoing monitoring of his blood counts Level of care: Telemetry   Dispo: The patient is from: snf            Anticipated disposition: snf in 1-2 days   Mobility Assessment (last 72 hours)     Mobility Assessment     Row Name 06/21/22 1130 06/18/22 2108         Does patient have an order for bedrest or is patient medically unstable No - Continue assessment No - Continue assessment      What is the highest level of mobility based on the progressive  mobility assessment? -- Level 2 ([place]) - Balance while sitting on edge of bed and cannot stand      Is the above level different from baseline mobility prior to current illness? -- Yes - Recommend PT order              Objective: Vitals last 24 hrs: Vitals:   06/20/22 1933 06/20/22 2324 06/21/22 0355 06/21/22 1258  BP: (!) 96/56 103/64 92/64 (!) 101/58  Pulse: 91 76 100 (!) 56  Resp: 17 18 19 20   Temp: (!) 97.5 F (36.4 C) 98.5 F (36.9 C) 98.4 F (36.9 C) 98.3 F (36.8 C)  TempSrc:    Oral  SpO2: 100% 99% 97% 94%  Weight:      Height:       Weight change:   Physical Examination: General exam: alert awake,older than stated age, weak appearing. HEENT:Oral mucosa moist, Ear/Nose WNL grossly, dentition normal. Respiratory system: bilaterally diminished BS, no use of accessory muscle Cardiovascular system: S1 & S2 +, No JVD. Gastrointestinal system: Abdomen soft,NT,ND, BS+ Nervous System:Alert, awake, moving extremities and grossly nonfocal Extremities: LE edema mild on left side with tenderness in the surgical site Skin: No rashes,no icterus. MSK: Normal muscle bulk,tone, power  Medications  reviewed:  Scheduled Meds:  amiodarone  200 mg Oral Daily   ferrous sulfate  325 mg Oral Q breakfast   polyethylene glycol  17 g Oral Daily   Continuous Infusions:  sodium chloride 75 mL/hr at 06/21/22 1214    Diet Order             Diet regular Room service appropriate? Yes; Fluid consistency: Thin  Diet effective now                  Intake/Output Summary (Last 24 hours) at 06/21/2022 1301 Last data filed at 06/21/2022 1214 Gross per 24 hour  Intake 2452.32 ml  Output 1000 ml  Net 1452.32 ml   Net IO Since Admission: 113.82 mL [06/21/22 1301]  Wt Readings from Last 3 Encounters:  06/16/22 80.3 kg  06/09/22 81.3 kg  05/18/22 81.9 kg     Unresulted Labs (From admission, onward)     Start     Ordered   06/22/22 5397  Basic metabolic panel  Daily at 5am,   R      Question:  Specimen collection method  Answer:  Unit=Unit collect   06/21/22 1012   06/22/22 0500  CBC  Daily at 5am,   R     Question:  Specimen collection method  Answer:  Unit=Unit collect   06/21/22 1012   06/21/22 1012  Urinalysis, Routine w reflex microscopic Urine, Clean Catch  ONCE - URGENT,   URGENT        06/21/22 1011   06/21/22 0907  SARS Coronavirus 2 by RT PCR (hospital order, performed in [place] hospital lab) *cepheid single result test* Anterior Nasal Swab  Once,   R        06/21/22 0907   06/18/22 1553  Methylmalonic acid, serum  Once,   R        06/18/22 1554          Data Reviewed: I have personally reviewed following labs and imaging studies CBC: Recent Labs  Lab 06/16/22 1323 06/17/22 0430 06/18/22 0633 06/19/22 0514 06/20/22 0611 06/21/22 0704  WBC 5.4 3.4* 2.9* 2.9* 3.3* 3.4*  NEUTROABS 4.2  --   --   --   --   --   HGB 8.1* 8.5* 8.5* 7.8* 7.5* 7.7*  HCT 24.2* 25.4* 25.9* 23.6* 22.5* 22.9*  MCV 94.9 94.8 96.3 96.3 97.0 95.8  PLT 59* 50* 42* 37* 43* 55*   Basic Metabolic Panel: Recent Labs  Lab 06/16/22 1323 06/17/22 0430 06/18/22 0633 06/19/22 0514 06/20/22 0611 06/21/22 0704  NA 129* 132* 138 137 134* 135  K 3.7 3.8 3.7 3.8 3.7 3.7  CL 98 107 111 111 110 112*  CO2 19* 19* 20* 21* 18* 18*  GLUCOSE 153* 109* 115* 106* 107* 111*  BUN 68* 63* 41* 38* 43* 44*  CREATININE 3.16* 2.49* 1.92* 1.70* 1.71* 1.61*  CALCIUM 8.0* 7.5* 7.7* 7.7* 7.1* 7.3*  MG 2.2  --   --   --  1.9  --    GFR: Estimated Creatinine Clearance: 42.8 mL/min (A) (by C-G formula based on SCr of 1.61 mg/dL (H)). Liver Function Tests: Recent Labs  Lab 06/16/22 1323  AST 20  ALT 16  ALKPHOS 48  BILITOT 1.4*  PROT 6.0*  ALBUMIN 2.5*   No results for input(s): "LIPASE", "AMYLASE" in the last 168 hours. No results for input(s): "AMMONIA" in the last 168 hours. Coagulation Profile: Recent Labs  Lab 06/16/22 1323  INR 2.4*  BNP (last 3 results) No results  for input(s): "PROBNP" in the last 8760 hours. HbA1C: No results for input(s): "HGBA1C" in the last 72 hours. CBG: Recent Labs  Lab 06/18/22 0528 06/19/22 0516 06/19/22 2106 06/20/22 0515 06/21/22 0358  GLUCAP 112* 103* 118* 112* 112*   Lipid Profile: No results for input(s): "CHOL", "HDL", "LDLCALC", "TRIG", "CHOLHDL", "LDLDIRECT" in the last 72 hours. Thyroid Function Tests: No results for input(s): "TSH", "T4TOTAL", "FREET4", "T3FREE", "THYROIDAB" in the last 72 hours. Sepsis Labs: No results for input(s): "PROCALCITON", "LATICACIDVEN" in the last 168 hours.  Recent Results (from the past 240 hour(s))  SARS Coronavirus 2 by RT PCR (hospital order, performed in [facility] hospital lab) *cepheid single result test* Anterior Nasal Swab     Status: None   Collection Time: 06/14/22 10:42 AM   Specimen: Anterior Nasal Swab  Result Value Ref Range Status   SARS Coronavirus 2 by RT PCR NEGATIVE NEGATIVE Final    Comment: (NOTE) SARS-CoV-2 target nucleic acids are NOT DETECTED.  The SARS-CoV-2 RNA is generally detectable in upper and lower respiratory specimens during the acute phase of infection. The lowest concentration of SARS-CoV-2 viral copies this assay can detect is 250 copies / mL. A negative result does not preclude SARS-CoV-2 infection and should not be used as the sole basis for treatment or other patient management decisions.  A negative result may occur with improper specimen collection / handling, submission of specimen other than nasopharyngeal swab, presence of viral mutation(s) within the areas targeted by this assay, and inadequate number of viral copies (<250 copies / mL). A negative result must be combined with clinical observations, patient history, and epidemiological information.  Fact Sheet for Patients:   https://www.patel.info/  Fact Sheet for Healthcare Providers: https://hall.com/  This test is not yet  approved or  cleared by the [place] FDA and has been authorized for detection and/or diagnosis of SARS-CoV-2 by FDA under an Emergency Use Authorization (EUA).  This EUA will remain in effect (meaning this test can be used) for the duration of the COVID-19 declaration under Section 564(b)(1) of the Act, 21 U.S.C. section 360bbb-3(b)(1), unless the authorization is terminated or revoked sooner.  Performed at [facility], [address], [place], [place] [address]   SARS Coronavirus 2 by RT PCR (hospital order, performed in [facility] hospital lab) *cepheid single result test* Anterior Nasal Swab     Status: None   Collection Time: 06/18/22  2:03 PM   Specimen: Anterior Nasal Swab  Result Value Ref Range Status   SARS Coronavirus 2 by RT PCR NEGATIVE NEGATIVE Final    Comment: (NOTE) SARS-CoV-2 target nucleic acids are NOT DETECTED.  The SARS-CoV-2 RNA is generally detectable in upper and lower respiratory specimens during the acute phase of infection. The lowest concentration of SARS-CoV-2 viral copies this assay can detect is 250 copies / mL. A negative result does not preclude SARS-CoV-2 infection and should not be used as the sole basis for treatment or other patient management decisions.  A negative result may occur with improper specimen collection / handling, submission of specimen other than nasopharyngeal swab, presence of viral mutation(s) within the areas targeted by this assay, and inadequate number of viral copies (<250 copies / mL). A negative result must be combined with clinical observations, patient history, and epidemiological information.  Fact Sheet for Patients:   https://www.patel.info/  Fact Sheet for Healthcare Providers: https://hall.com/  This test is not yet approved or  cleared by the [place]  States FDA and has been authorized for detection and/or diagnosis of SARS-CoV-2 by FDA under an Emergency Use  Authorization (EUA).  This EUA will remain in effect (meaning this test can be used) for the duration of the COVID-19 declaration under Section 564(b)(1) of the Act, 21 U.S.C. section 360bbb-3(b)(1), unless the authorization is terminated or revoked sooner.  Performed at [facility], [address], [place], [place] [address]     Antimicrobials: Anti-infectives (From admission, onward)    None      Culture/Microbiology    Component Value Date/Time   SDES  04/09/2020 1100    URINE, RANDOM Performed at [facility], [address] [place], [place] [address]    [facility]  04/09/2020 1100    NONE Performed at [facility], [address], [place], [place] [address]    CULT (A) 04/09/2020 1100    <10,000 COLONIES/mL INSIGNIFICANT GROWTH Performed at [place] [address] [place],  [address]    REPTSTATUS 04/10/2020 FINAL 04/09/2020 1100    Other culture-see note  Radiology Studies: No results found.   LOS: 4 days   Antonieta Pert, MD [facility]  06/21/2022, 1:01 PM

## 2022-06-21 NOTE — Hospital Course (Addendum)
76 year old male with history of CKD stage 3b, COPD, combined systolic/diastolic CHF, A-fib, lung cancer with brain mets, Hodgkin's lymphoma who was brought to the emergency department from pain center after he passed out.  As per the report, he felt dizzy, passed out transiently.  He was reporting intermittent dizziness over the last few days, poor oral intake.  Also reported persistent pain on the left hip where  surgery was done.  He was recently hospitalized here and was discharged on 9/25 after being managed for left femur fracture status post ORIF by Dr. Amedeo Kinsman.  On presentation, he was hypotensive, given IV fluids.  Hemoglobin was 8.1.  Creatinine was 3.1, sodium of 129.  Patient was admitted for the management of acute kidney injury on CKD, hypotension, anemia.  Hospital course also remarkable for progressive thrombocytopenia.  Hemoglobin/oncology consulted. Patient was admitted for hypotension and syncope BP stabilized home antihypertensives held today with IV fluids, AKI on CKD stage IIIb improving from baseline creatinine of 1.5-1.8.  Creatinine improved to 1.6. Patient also anemia pancytopenia FOBT negative suspect anemia due to chemotherapy for NHL and anemia of chronic disease.  Hemoglobin overall stable, status post 1 unit PRBC transfusion.  B12 folate LDH normal discussed with Dr. Delton Coombes from heme-onc-unclear etiology of thrombocytopenia, platelet improved to 55, was as low as 37k. Other issues include recent left hip fracture/diabetes/NHL/permanent A-fib-for his Eliquis was held due to patient's anemia and thrombocytopenia, metoprolol discontinued due to soft BP on 10/1. Patient had dark-colored urine UA showed pyuria WBC more than 50 RBC 0-5, bacteria many, leukocytes moderate but at this time no fever no leukocytosis.

## 2022-06-21 NOTE — TOC Progression Note (Signed)
Transition of Care [facility]) - Progression Note    Patient Details  Name: Darius Smith MRN: [id number] Date of Birth: 01-14-1946  Transition of Care [facility]) CM/SW Contact  Salome Arnt, [place] Phone Number: 06/21/2022, 12:37 PM  Clinical Narrative: SNF authorization received. MD notified pt will need new COVID test prior to return to [facility]. SNF updated not medically ready. TOC will continue to follow.       Expected Discharge Plan: [place] Barriers to Discharge: Continued Medical Work up  Expected Discharge Plan and Services Expected Discharge Plan: [place]                                               Social Determinants of Health (SDOH) Interventions Housing Interventions: Intervention Not Indicated  Readmission Risk Interventions    08/04/2020    4:53 PM 08/01/2020    9:34 AM  Readmission Risk Prevention Plan  Transportation Screening Complete Complete  HRI or Home Care Consult Complete Complete  Social Work Consult for [place] Planning/Counseling Complete Complete  Palliative Care Screening Complete Not Applicable  Medication Review [employer] photographer) Complete Complete

## 2022-06-22 ENCOUNTER — Inpatient Hospital Stay (HOSPITAL_COMMUNITY): Payer: Medicare Other

## 2022-06-22 ENCOUNTER — Encounter: Payer: Medicare Other | Admitting: Orthopedic Surgery

## 2022-06-22 ENCOUNTER — Telehealth: Payer: Self-pay | Admitting: Orthopedic Surgery

## 2022-06-22 DIAGNOSIS — I9589 Other hypotension: Secondary | ICD-10-CM | POA: Diagnosis not present

## 2022-06-22 DIAGNOSIS — S72142D Displaced intertrochanteric fracture of left femur, subsequent encounter for closed fracture with routine healing: Secondary | ICD-10-CM

## 2022-06-22 DIAGNOSIS — E861 Hypovolemia: Secondary | ICD-10-CM | POA: Diagnosis not present

## 2022-06-22 LAB — BASIC METABOLIC PANEL
Anion gap: 7 (ref 5–15)
BUN: 39 mg/dL — ABNORMAL HIGH (ref 8–23)
CO2: 19 mmol/L — ABNORMAL LOW (ref 22–32)
Calcium: 7.5 mg/dL — ABNORMAL LOW (ref 8.9–10.3)
Chloride: 113 mmol/L — ABNORMAL HIGH (ref 98–111)
Creatinine, Ser: 1.48 mg/dL — ABNORMAL HIGH (ref 0.61–1.24)
GFR, Estimated: 49 mL/min — ABNORMAL LOW (ref >60)
Glucose, Bld: 101 mg/dL — ABNORMAL HIGH (ref 70–99)
Potassium: 3.8 mmol/L (ref 3.5–5.1)
Sodium: 139 mmol/L (ref 135–145)

## 2022-06-22 LAB — URINALYSIS, COMPLETE (UACMP) WITH MICROSCOPIC
Bilirubin Urine: NEGATIVE
Glucose, UA: NEGATIVE mg/dL
Ketones, ur: NEGATIVE mg/dL
Nitrite: NEGATIVE
Protein, ur: 100 mg/dL — AB
Specific Gravity, Urine: 1.01 (ref 1.005–1.030)
WBC, UA: >50 WBC/hpf — ABNORMAL HIGH (ref 0–5)
pH: 5 (ref 5.0–8.0)

## 2022-06-22 LAB — CBC WITH DIFFERENTIAL/PLATELET
Abs Immature Granulocytes: 0 10*3/uL (ref 0.00–0.07)
Band Neutrophils: 15 %
Basophils Absolute: 0 10*3/uL (ref 0.0–0.1)
Basophils Relative: 2 %
Eosinophils Absolute: 0 10*3/uL (ref 0.0–0.5)
Eosinophils Relative: 0 %
HCT: 25.1 % — ABNORMAL LOW (ref 39.0–52.0)
Hemoglobin: 8.4 g/dL — ABNORMAL LOW (ref 13.0–17.0)
Lymphocytes Relative: 4 %
Lymphs Abs: 0.1 10*3/uL — ABNORMAL LOW (ref 0.7–4.0)
MCH: 31.5 pg (ref 26.0–34.0)
MCHC: 33.5 g/dL (ref 30.0–36.0)
MCV: 94 fL (ref 80.0–100.0)
Monocytes Absolute: 0.1 10*3/uL (ref 0.1–1.0)
Monocytes Relative: 3 %
Neutro Abs: 1.8 10*3/uL (ref 1.7–7.7)
Neutrophils Relative %: 76 %
Platelets: 35 10*3/uL — ABNORMAL LOW (ref 150–400)
RBC: 2.67 MIL/uL — ABNORMAL LOW (ref 4.22–5.81)
RDW: 15.2 % (ref 11.5–15.5)
WBC: 2 10*3/uL — ABNORMAL LOW (ref 4.0–10.5)
nRBC: 0 % (ref 0.0–0.2)

## 2022-06-22 LAB — COMPREHENSIVE METABOLIC PANEL
ALT: 57 U/L — ABNORMAL HIGH (ref 0–44)
AST: 47 U/L — ABNORMAL HIGH (ref 15–41)
Albumin: 1.9 g/dL — ABNORMAL LOW (ref 3.5–5.0)
Alkaline Phosphatase: 88 U/L (ref 38–126)
Anion gap: 8 (ref 5–15)
BUN: 42 mg/dL — ABNORMAL HIGH (ref 8–23)
CO2: 15 mmol/L — ABNORMAL LOW (ref 22–32)
Calcium: 7.5 mg/dL — ABNORMAL LOW (ref 8.9–10.3)
Chloride: 113 mmol/L — ABNORMAL HIGH (ref 98–111)
Creatinine, Ser: 1.91 mg/dL — ABNORMAL HIGH (ref 0.61–1.24)
GFR, Estimated: 36 mL/min — ABNORMAL LOW (ref >60)
Glucose, Bld: 112 mg/dL — ABNORMAL HIGH (ref 70–99)
Potassium: 4.2 mmol/L (ref 3.5–5.1)
Sodium: 136 mmol/L (ref 135–145)
Total Bilirubin: 3.2 mg/dL — ABNORMAL HIGH (ref 0.3–1.2)
Total Protein: 4.6 g/dL — ABNORMAL LOW (ref 6.5–8.1)

## 2022-06-22 LAB — CBC
HCT: 20.9 % — ABNORMAL LOW (ref 39.0–52.0)
Hemoglobin: 6.9 g/dL — CL (ref 13.0–17.0)
MCH: 31.8 pg (ref 26.0–34.0)
MCHC: 33 g/dL (ref 30.0–36.0)
MCV: 96.3 fL (ref 80.0–100.0)
Platelets: 61 10*3/uL — ABNORMAL LOW (ref 150–400)
RBC: 2.17 MIL/uL — ABNORMAL LOW (ref 4.22–5.81)
RDW: 14.6 % (ref 11.5–15.5)
WBC: 3.1 10*3/uL — ABNORMAL LOW (ref 4.0–10.5)
nRBC: 0 % (ref 0.0–0.2)

## 2022-06-22 LAB — METHYLMALONIC ACID, SERUM: Methylmalonic Acid, Quantitative: 540 nmol/L — ABNORMAL HIGH (ref 0–378)

## 2022-06-22 LAB — GLUCOSE, CAPILLARY: Glucose-Capillary: 95 mg/dL (ref 70–99)

## 2022-06-22 LAB — HEMOGLOBIN AND HEMATOCRIT, BLOOD
HCT: 20.7 % — ABNORMAL LOW (ref 39.0–52.0)
Hemoglobin: 6.8 g/dL — CL (ref 13.0–17.0)

## 2022-06-22 LAB — LACTIC ACID, PLASMA
Lactic Acid, Venous: 1.9 mmol/L (ref 0.5–1.9)
Lactic Acid, Venous: 1.8 mmol/L (ref 0.5–1.9)

## 2022-06-22 MED ORDER — METHYLPREDNISOLONE SODIUM SUCC 125 MG IJ SOLR
125.0000 mg | Freq: Once | INTRAMUSCULAR | Status: DC
  Filled 2022-06-22: qty 2

## 2022-06-22 MED ORDER — SODIUM CHLORIDE 0.9 % IV SOLN
2.0000 g | Freq: Two times a day (BID) | INTRAVENOUS | Status: DC
  Administered 2022-06-22 – 2022-06-23 (×2): 2 g via INTRAVENOUS
  Filled 2022-06-22 (×2): qty 12.5

## 2022-06-22 MED ORDER — DIPHENHYDRAMINE HCL 50 MG/ML IJ SOLN
25.0000 mg | Freq: Once | INTRAMUSCULAR | Status: AC
  Filled 2022-06-22: qty 1

## 2022-06-22 MED ORDER — SODIUM CHLORIDE 0.9 % IV BOLUS
500.0000 mL | Freq: Once | INTRAVENOUS | Status: AC
  Administered 2022-06-22: 500 mL via INTRAVENOUS

## 2022-06-22 MED ORDER — DIPHENHYDRAMINE HCL 50 MG/ML IJ SOLN
INTRAMUSCULAR | Status: AC
  Administered 2022-06-22: 25 mg via INTRAVENOUS
  Filled 2022-06-22: qty 1

## 2022-06-22 MED ORDER — ACETAMINOPHEN 500 MG PO TABS
500.0000 mg | ORAL_TABLET | Freq: Once | ORAL | Status: AC
  Administered 2022-06-22: 500 mg via ORAL
  Filled 2022-06-22: qty 1

## 2022-06-22 MED ORDER — SODIUM CHLORIDE 0.9% IV SOLUTION: Freq: Once | INTRAVENOUS | Status: AC

## 2022-06-22 MED ORDER — SODIUM BICARBONATE 650 MG PO TABS
650.0000 mg | ORAL_TABLET | Freq: Two times a day (BID) | ORAL | Status: DC
  Administered 2022-06-22 – 2022-06-27 (×10): 650 mg via ORAL
  Filled 2022-06-22 (×10): qty 1

## 2022-06-22 MED ORDER — METHYLPREDNISOLONE SODIUM SUCC 125 MG IJ SOLR
60.0000 mg | Freq: Once | INTRAMUSCULAR | Status: AC
  Administered 2022-06-22: 60 mg via INTRAVENOUS

## 2022-06-22 MED ORDER — FUROSEMIDE 10 MG/ML IJ SOLN
20.0000 mg | Freq: Once | INTRAMUSCULAR | Status: AC
  Administered 2022-06-22: 20 mg via INTRAVENOUS
  Filled 2022-06-22: qty 2

## 2022-06-22 MED ORDER — FUROSEMIDE 20 MG PO TABS
20.0000 mg | ORAL_TABLET | ORAL | Status: DC
  Administered 2022-06-22: 20 mg via ORAL
  Filled 2022-06-22: qty 1

## 2022-06-22 NOTE — Progress Notes (Signed)
Orthopaedic Postop Note  Assessment: Darius Smith is a 76 y.o. male s/p cephalomedullary nail for Left intertrochanteric femur fracture  DOS: 06/11/22  Plan: Radiographs of the left hip are stable. Issues with low hemoglobin at this point, most likely secondary to his chronic disease, treatment for non-Hodgkin's lymphoma.  This was discussed with the patient.  All questions were answered. Sutures trimmed, steri strips placed Continue with protective WBAT Continue with DVT prophylaxis for at least 6 weeks after surgery WBAT on the operative extremity Follow up in 4 weeks; call with any issues   Follow-up: No follow-ups on file. XR at next visit: AP pelvis and Left femur  Subjective:  Chief Complaint  Patient presents with   Loss of Consciousness    History of Present Illness: Darius Smith is a 76 y.o. male who is seen in the hospital following the above stated procedure.  He was discharged from the hospital following surgery, and was unfortunately having issues with his blood pressure, and demonstrated some dizziness, and did pass out once.  He continues to have multiple medical issues, and his hemoglobin has remained below 8.  He has received multiple doses of packed red blood cells.  His hip is improving.  Therapy is on hold due to his underlying issues.  No numbness or tingling.  Review of Systems: No fevers or chills No numbness or tingling No Chest Pain + shortness of breath + Dizziness + Loss of consciousness   Objective: BP (!) 120/53 (BP Location: Right Arm)   Pulse 86   Temp 98.2 F (36.8 C) (Oral)   Resp (!) 26   Ht 6' (1.829 m)   Wt 80.3 kg   SpO2 94%   BMI 24.01 kg/m      Latest Ref Rng & Units 06/22/2022    6:46 AM 06/22/2022    4:50 AM 06/21/2022    7:04 AM  CBC  WBC 4.0 - 10.5 K/uL  3.1  3.4   Hemoglobin 13.0 - 17.0 g/dL 6.8  6.9  7.7   Hematocrit 39.0 - 52.0 % 20.7  20.9  22.9   Platelets 150 - 400 K/uL  61  55       Physical  Exam:  Alert and oriented.  No acute distress.  Laying in the hospital bed.  Surgical incisions are healing well.  No surrounding erythema.  Some mild skin irritation around the incision.  Evidence of some serous drainage, without active drainage.  Active motion intact in the TA/EHL.  Toes are warm and well-perfused.   IMAGING: I personally ordered and reviewed the following images:  XR of the Left femur and AP pelvis demonstrates a well positioned cephalomedullary nail.   The intertrochanteric femur fracture remains in stable position.  There is no evidence of implant subsidence.  No acute fractures are noted.  Impression: Left intertrochanteric femur fracture in stable position without evidence of loosening or subsidence    Mordecai Rasmussen, MD 06/22/2022 2:40 PM

## 2022-06-22 NOTE — Progress Notes (Signed)
   06/22/22 1355  Vitals  Temp 99.8 F (37.7 C)  Temp Source Oral  BP (!) 145/104  MAP (mmHg) 117  BP Location Right Arm  BP Method Automatic  Patient Position (if appropriate) Lying  Pulse Rate (!) 107  Pulse Rate Source Dinamap  Resp (!) 28  Level of Consciousness  Level of Consciousness Alert  MEWS COLOR  MEWS Score Color Yellow  MEWS Score  MEWS Temp 0  MEWS Systolic 0  MEWS Pulse 1  MEWS RR 2  MEWS LOC 0  MEWS Score 3   Called to room by patient, arrived to find pt with hard, shaking chills and tachypneic, states feels a little SOB. Pt states, "I don't know what happened. I was doing fine, eating my muffin and all of a sudden I just felt so cold and hard to breathe." SaO2 93% on room air. HR 107-110/min, sinus rhythm. Chest with congestion and expiratory wheezes noted in upper chest, R>L. No cough. No rash. Blood transfusion stopped (only 17.5 ml left to infuse per pump), Normal Saline started. MD Ramesh notified via AMION of probably transfusion reaction. Pt then began vomiting undigested food, moderate amount. Pt able to clear airway without assistance. Zofran IV given for relief of nausea/vomiting. Pt's wife presents to room and updated on current condition and concern, states understanding. Repeat vital signs show continuing tachypnea 30-32/min , ST 112.

## 2022-06-22 NOTE — Progress Notes (Signed)
   06/22/22 1527  Assess: MEWS Score  Temp 100 F (37.8 C)  BP (!) 111/50  MAP (mmHg) 67  Pulse Rate 75  Resp (!) 37  Level of Consciousness Alert  SpO2 98 %  O2 Device Room Air  Assess: MEWS Score  MEWS Temp 0  MEWS Systolic 0  MEWS Pulse 0  MEWS RR 3  MEWS LOC 0  MEWS Score 3  MEWS Score Color Yellow  Assess: if the MEWS score is Yellow or Red  Were vital signs taken at a resting state? Yes  Focused Assessment Change from prior assessment (see assessment flowsheet)  MEWS guidelines implemented *See Row Information* Yes  Treat  MEWS Interventions Escalated (See documentation below)  Pain Scale 0-10  Pain Score 0  Escalate  MEWS: Escalate Yellow: discuss with charge nurse/RN and consider discussing with provider and RRT  Notify: Charge Nurse/RN  Name of Charge Nurse/RN Notified Emiliano Dyer, RN  Date Charge Nurse/RN Notified 06/22/22  Time Charge Nurse/RN Notified 1532  Assess: SIRS CRITERIA  SIRS Temperature  0  SIRS Pulse 0  SIRS Respirations  1  SIRS WBC 1  SIRS Score Sum  2   Pt is sweaty and has skin feels clammy. Respirations are 37. Pt is alert and oriented to person, time, place, and situation. Will continue to monitor pt.

## 2022-06-22 NOTE — Telephone Encounter (Signed)
Noted  

## 2022-06-22 NOTE — Significant Event (Signed)
Pt had transfusion reaction towards completion of blood transfusion- seen and examined No longer wheezing, has been tachypneic tacycardic and hypertensive- saw multiple times- cxr clear, given solumedrol lasix and benadryl. He feels improved and les anxious, calm and no longer shaky- discussed with Dr Delton Coombes- sent blod down for testing- repeat cbc.Will let him rest today-may need to try transfusion again in am if still low per Dr Delton Coombes.

## 2022-06-22 NOTE — Progress Notes (Signed)
   06/22/22 1445  Assess: MEWS Score  Temp 98.8 F (37.1 C)  BP 119/68  MAP (mmHg) 81  Pulse Rate 77  Resp (!) 28  Level of Consciousness Alert  SpO2 98 %  O2 Device Room Air  Assess: MEWS Score  MEWS Temp 0  MEWS Systolic 0  MEWS Pulse 0  MEWS RR 2  MEWS LOC 0  MEWS Score 2  MEWS Score Color Yellow  Assess: SIRS CRITERIA  SIRS Temperature  0  SIRS Pulse 0  SIRS Respirations  1  SIRS WBC 1  SIRS Score Sum  2

## 2022-06-22 NOTE — Telephone Encounter (Signed)
Patient's wife called to relay that patient is still in-patient at [facility]; states thought would be out by today to come to his appointment this afternoon - said wanted to let Dr Amedeo Kinsman know. States will have to call to reschedule.

## 2022-06-22 NOTE — Progress Notes (Signed)
Pharmacy Antibiotic Note  Darius Smith is a 76 y.o. male admitted on 06/16/2022 with UTI.  Pharmacy has been consulted for cefepime dosing.  Plan: Cefepime 2000 mg IV every 12 hours. Monitor labs, c/s, and patient improvement.  Height: 6' (182.9 cm) Weight: 80.3 kg (177 lb 0.4 oz) IBW/kg (Calculated) : 77.6  Temp (24hrs), Avg:99.2 F (37.3 C), Min:98.2 F (36.8 C), Max:100.8 F (38.2 C)  Recent Labs  Lab 06/19/22 0514 06/20/22 0611 06/21/22 0704 06/22/22 0450 06/22/22 1545  WBC 2.9* 3.3* 3.4* 3.1* 0.6*  CREATININE 1.70* 1.71* 1.61* 1.48* 1.91*    Estimated Creatinine Clearance: 36.1 mL/min (A) (by C-G formula based on SCr of 1.91 mg/dL (H)).    Allergies  Allergen Reactions   Flomax [Tamsulosin Hcl] Nausea Only    Dizziness  Pt is on flomax   Heparin Other (See Comments)    Tingling in face and shortness of breath    Antimicrobials this admission: Cefepime 10/3 >>  Microbiology results: 10/3 BCx: pending 10/3 UCx: pending   Thank you for allowing pharmacy to be a part of this patient's care.  Margot Ables, PharmD Clinical Pharmacist 06/22/2022 5:09 PM

## 2022-06-22 NOTE — Progress Notes (Signed)
   06/22/22 1430  Vitals  Temp 98.2 F (36.8 C)  Temp Source Oral  BP (!) 120/53  MAP (mmHg) 72  BP Location Right Arm  BP Method Automatic  Patient Position (if appropriate) Lying  Pulse Rate 86  Pulse Rate Source Dinamap  ECG Heart Rate 86  Resp (!) 26  MEWS COLOR  MEWS Score Color Yellow  Oxygen Therapy  SpO2 94 %  O2 Device Room Air  MEWS Score  MEWS Temp 0  MEWS Systolic 0  MEWS Pulse 0  MEWS RR 2  MEWS LOC 0  MEWS Score 2   Pt states feeling better, meds being administered per order. Pt does have some wandering conversation, thinks he is in the [facility] and states that he was up in the bathroom when he got sick. Pt reoriented to place, time and situation. Denies any SOB. Radiology in to obtain CXR.

## 2022-06-22 NOTE — Progress Notes (Addendum)
Date and time results received: 06/22/22 0713 (use smartphrase ".now" to insert current time)  Test: Hg Critical Value: 6.8  Name of Provider Notified: Maren Beach  See new orders

## 2022-06-22 NOTE — Progress Notes (Signed)
   06/22/22 1416  Vitals  BP (!) 144/79  MAP (mmHg) 92  BP Location Right Arm  BP Method Automatic  Patient Position (if appropriate) Lying  Pulse Rate (!) 104  Pulse Rate Source Monitor  MEWS COLOR  MEWS Score Color Yellow  Oxygen Therapy  SpO2 94 %  O2 Device Room Air  MEWS Score  MEWS Temp 0  MEWS Systolic 0  MEWS Pulse 1  MEWS RR 2  MEWS LOC 0  MEWS Score 3   MD Ramesh at bedside to evaluate pt. Pt alert, oriented, continues with shaking chills though not as bad as previous. Remains tachypneic at 26-28 resp/min but now denies any SOB, SaO2 95% on room air. Continues with exp wheeze right upper chest > left upper chest, no cough noted. Pt denies any c/o pain or nausea at this time. Orders received.

## 2022-06-22 NOTE — Progress Notes (Signed)
PROGRESS NOTE Darius Smith  OTL:[id number] DOB: 12/22/1945 DOA: 06/16/2022 PCP: [name], MD   Brief Narrative/Hospital Course: 76 year old male with history of CKD stage 3b, COPD, combined systolic/diastolic CHF, A-fib, lung cancer with brain mets, Hodgkin's lymphoma who was brought to the emergency department from pain center after he passed out.  As per the report, he felt dizzy, passed out transiently.  He was reporting intermittent dizziness over the last few days, poor oral intake.  Also reported persistent pain on the left hip where  surgery was done.  He was recently hospitalized here and was discharged on 9/25 after being managed for left femur fracture status post ORIF by Dr. Amedeo Kinsman.  On presentation, he was hypotensive, given IV fluids.  Hemoglobin was 8.1.  Creatinine was 3.1, sodium of 129.  Patient was admitted for the management of acute kidney injury on CKD, hypotension, anemia.  Hospital course also remarkable for progressive thrombocytopenia.  Hemoglobin/oncology consulted. Patient was admitted for hypotension and syncope BP stabilized home antihypertensives held today with IV fluids, AKI on CKD stage IIIb improving from baseline creatinine of 1.5-1.8.  Creatinine improved to 1.6. Patient also anemia pancytopenia FOBT negative suspect anemia due to chemotherapy for NHL and anemia of chronic disease.  Hemoglobin overall stable, status post 1 unit PRBC transfusion.  B12 folate LDH normal discussed with Dr. Delton Coombes from heme-onc-unclear etiology of thrombocytopenia, platelet improved to 55, was as low as 37k. Other issues include recent left hip fracture/diabetes/NHL/permanent A-fib-for his Eliquis was held due to patient's anemia and thrombocytopenia, metoprolol discontinued due to soft BP on 10/1. Patient had dark-colored urine UA showed pyuria WBC more than 50 RBC 0-5, bacteria many, leukocytes moderate but at this time no fever no leukocytosis.    Subjective: Seen and examined  this morning.  Alert awake no complaints no leg pain. He has bruise on the left inner thigh Patient's hemoglobin was 6.8 g at 4 am today> recheck done and is still low Overnight afebrile  Assessment and Plan: Principal Problem:   Hypotension Active Problems:   AKI (acute kidney injury) ([place])   Syncope   Closed comminuted intertrochanteric fracture of left femur (HCC)   Acute anemia   Essential hypertension   Atrial fibrillation (HCC)   Non Hodgkin's lymphoma (HCC)   Chronic combined systolic and diastolic CHF (congestive heart failure) (HCC)   Stage 3b chronic kidney disease ([place])   DNR (do not resuscitate) discussion   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[place])   Thrombocytopenia ([place])   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  Antihypertensives have been discontinued.  Off IV fluids.  Continue oral encouragement continue to mobilize  AKI on CKD stage IIIb baseline creatinine 1.5-1.8 on presentation 3.1 now nicely improved.  IV fluids discontinued encourage oral intake . Has significant lower leg edema resume Lasix.  Recent Labs  Lab 06/18/22 0633 06/19/22 0514 06/20/22 0611 06/21/22 0704 06/22/22 0450  BUN 41* 38* 43* 44* 39*  CREATININE 1.92* 1.70* 1.71* 1.61* 1.48*    Anemia normocytic likely multifactoria Acute blood loss anemia due to hematoma/from recent left hip fracture and surgery and also  anemia of chronic disease and chemotherapy for NHL: FOBT was negative.  S/P 1 unit PRBC on admission repeat H&H is still low 6.8 g will order 1 more unit transfusion and recheck.  He has bruise on his left thigh,  had a recent intramedullary nailing of the left femur. Recent Labs  Lab 06/19/22 0514 06/20/22 0611 06/21/22 0704 06/22/22 0450 06/22/22 0646  HGB 7.8* 7.5* 7.7* 6.9* 6.8*  HCT 23.6* 22.5* 22.9* 20.9* 20.7*    Thrombocytopenia/leukopenia unclear etiology B12 folate LDH normal. Hematology/oncology- Dr. Delton Coombes following-appreciate input.MMA pending.  Platelet count improving as below Recent Labs  Lab 06/18/22 0633 06/19/22 0514 06/20/22 0611 06/21/22 0704 06/22/22 0450  PLT 42* 37* 43* 55* 61*  Chronic combined systolic/diastolic CHF no obvious fluid overload last echo with EF 50% G1 DD in 2022.  Off ivf.Monitor intake output.  Can use prn Lasix if has shortness of breath or crackles.  Blood transfusion Net IO Since Admission: -1,036.18 mL [06/22/22 1039]   Permanent A-fib: Rate controlled on amiodarone.  Eliquis remains on hold due to patient's anemia.  Discussed with patient and his wife at the bedside who agree with holding for now will need to be reassessed. He is followed by cardiologist Dr. Harl Bowie as OP. He had  A-fib with RVR on 9/29 and placed on low-dose metoprolol but discontinued due to soft BP 06/20/22  NHL w/ CNS involvement last PET scan and MRI of the brain on 06/02/2022 showed complete response to therapy.Followed by heme oncology outpatient   Type 2 diabetes A1c stable in 5,blood sugar well controlled.  Continue to monitor Recent Labs  Lab 06/19/22 0516 06/19/22 2106 06/20/22 0515 06/21/22 0358 06/22/22 0456  GLUCAP 103* 118* 112* 112* 95  History of comminuted intertrochanteric fracture of left femur ,s/p ORIF on 9/22.Wound looks clean but was suspected to have intramuscular hematoma.CT hip showed slight finding of hematoma but negative for large hematoma or bleeding.  Eliquis remains on hold.  Plan is for skilled nursing facility  Dark-colored urine:Patient had dark-colored urine UA showed pyuria WBC more than 50 RBC 0-5, bacteria many, leukocytes moderate: Urine culture sent 10/2- follow  Constipation no BM for 3 to 4 days started MiraLAX daily 10/2  DVT prophylaxis: SCDs Start: 06/16/22 1737 Code Status:   Code Status: DNR Family Communication: plan of care  discussed with patient/wife at bedside. Patient status is: Inpatient because of ongoing monitoring of his blood counts Level of care: Telemetry   Dispo: The patient is from: snf            Anticipated disposition: snf in 1-2 days   Mobility Assessment (last 72 hours)     Mobility Assessment     Row Name 06/21/22 2000 06/21/22 1130         Does patient have an order for bedrest or is patient medically unstable No - Continue assessment No - Continue assessment      What is the highest level of mobility based on the progressive mobility assessment? Level 3 (Stands with assist) - Balance while standing  and cannot march in place --              Objective: Vitals last 24 hrs: Vitals:   06/21/22 1258 06/21/22 2100 06/22/22 0037 06/22/22 0605  BP: (!) 101/58 (!) 110/53 110/60 110/62  Pulse: (!) 56 (!) 58 69 66  Resp: 20 (!) 22 20 (!) 22  Temp: 98.3 F (36.8 C) 99.5 F (37.5 C) 98.8 F (37.1 C) 98.3 F (36.8 C)  TempSrc: Oral Oral Oral Oral  SpO2: 94% 98% 99% 98%  Weight:      Height:       Weight change:   Physical Examination: General exam: Aaox3, pleasant, comfortable, on room air  HEENT:Oral mucosa moist, Ear/Nose WNL grossly, dentition normal. Respiratory system: bilaterally diminished, no use of accessory muscle Cardiovascular system: S1 & S2 +, No JVD,. Gastrointestinal system: Abdomen soft,NT,ND,BS+ Nervous System:Alert, awake, moving extremities and grossly nonfocal Extremities: LE ankle edema + b/l, bruise on left inner thigh, left hip surgical site with dressing in place Skin: No rashes,no icterus. MSK: Normal muscle bulk,tone, power   Medications reviewed:  Scheduled Meds:  sodium chloride   Intravenous Once   amiodarone  200 mg Oral Daily   ferrous sulfate  325 mg Oral Q breakfast   polyethylene glycol  17 g Oral Daily   Continuous Infusions:    Diet Order             Diet regular Room service appropriate? Yes; Fluid consistency: Thin  Diet  effective now                  Intake/Output Summary (Last 24 hours) at 06/22/2022 1039 Last data filed at 06/22/2022 0437 Gross per 24 hour  Intake 2452.32 ml  Output 1150 ml  Net 1302.32 ml   Net IO Since Admission: -1,036.18 mL [06/22/22 1039]  Wt Readings from Last 3 Encounters:  06/16/22 80.3 kg  06/09/22 81.3 kg  05/18/22 81.9 kg     Unresulted Labs (From admission, onward)     Start     Ordered   06/22/22 0725  Prepare RBC (crossmatch)  (Adult Blood Administration - Red Blood Cells)  Once,   R       Question Answer Comment  # of Units 1 unit   Transfusion Indications Symptomatic Anemia   Number of Units to Keep Ahead NO units ahead   If emergent release call blood bank Not emergent release      06/22/22 0724   06/22/22 6803  Basic metabolic panel  Daily at 5am,   R     Question:  Specimen collection method  Answer:  Unit=Unit collect   06/21/22 1012   06/22/22 0500  CBC  Daily at 5am,   R     Question:  Specimen collection method  Answer:  Unit=Unit collect   06/21/22 1012   06/21/22 2031  Urine Culture  (Urine Culture)  Add-on,   AD       Question:  Indication  Answer:  Dysuria   06/21/22 2031   06/18/22 1553  Methylmalonic acid, serum  Once,   R        06/18/22 1554          Data Reviewed: I have personally reviewed following labs and imaging studies CBC: Recent Labs  Lab 06/16/22 1323 06/17/22 0430 06/18/22 0633 06/19/22 0514 06/20/22 0611 06/21/22 0704 06/22/22 0450 06/22/22 0646  WBC 5.4   < > 2.9* 2.9* 3.3* 3.4* 3.1*  --   NEUTROABS 4.2  --   --   --   --   --   --   --   HGB 8.1*   < > 8.5* 7.8* 7.5* 7.7* 6.9* 6.8*  HCT 24.2*   < > 25.9* 23.6* 22.5* 22.9* 20.9* 20.7*  MCV 94.9   < > 96.3 96.3 97.0 95.8 96.3  --   PLT 59*   < > 42* 37* 43* 55* 61*  --    < > = values in this interval not displayed.   Basic Metabolic Panel: Recent Labs  Lab 06/16/22 1323 06/17/22 0430 06/18/22 2122 06/19/22 0514 06/20/22 0611 06/21/22 0704  06/22/22 0450  NA 129*   < >  138 137 134* 135 139  K 3.7   < > 3.7 3.8 3.7 3.7 3.8  CL 98   < > 111 111 110 112* 113*  CO2 19*   < > 20* 21* 18* 18* 19*  GLUCOSE 153*   < > 115* 106* 107* 111* 101*  BUN 68*   < > 41* 38* 43* 44* 39*  CREATININE 3.16*   < > 1.92* 1.70* 1.71* 1.61* 1.48*  CALCIUM 8.0*   < > 7.7* 7.7* 7.1* 7.3* 7.5*  MG 2.2  --   --   --  1.9  --   --    < > = values in this interval not displayed.   GFR: Estimated Creatinine Clearance: 46.6 mL/min (A) (by C-G formula based on SCr of 1.48 mg/dL (H)). Liver Function Tests: Recent Labs  Lab 06/16/22 1323  AST 20  ALT 16  ALKPHOS 48  BILITOT 1.4*  PROT 6.0*  ALBUMIN 2.5*   No results for input(s): "LIPASE", "AMYLASE" in the last 168 hours. No results for input(s): "AMMONIA" in the last 168 hours. Coagulation Profile: Recent Labs  Lab 06/16/22 1323  INR 2.4*   BNP (last 3 results) No results for input(s): "PROBNP" in the last 8760 hours. HbA1C: No results for input(s): "HGBA1C" in the last 72 hours. CBG: Recent Labs  Lab 06/19/22 0516 06/19/22 2106 06/20/22 0515 06/21/22 0358 06/22/22 0456  GLUCAP 103* 118* 112* 112* 95   Lipid Profile: No results for input(s): "CHOL", "HDL", "LDLCALC", "TRIG", "CHOLHDL", "LDLDIRECT" in the last 72 hours. Thyroid Function Tests: No results for input(s): "TSH", "T4TOTAL", "FREET4", "T3FREE", "THYROIDAB" in the last 72 hours. Sepsis Labs: No results for input(s): "PROCALCITON", "LATICACIDVEN" in the last 168 hours.  Recent Results (from the past 240 hour(s))  SARS Coronavirus 2 by RT PCR (hospital order, performed in [facility] hospital lab) *cepheid single result test* Anterior Nasal Swab     Status: None   Collection Time: 06/14/22 10:42 AM   Specimen: Anterior Nasal Swab  Result Value Ref Range Status   SARS Coronavirus 2 by RT PCR NEGATIVE NEGATIVE Final    Comment: (NOTE) SARS-CoV-2 target nucleic acids are NOT DETECTED.  The SARS-CoV-2 RNA is generally  detectable in upper and lower respiratory specimens during the acute phase of infection. The lowest concentration of SARS-CoV-2 viral copies this assay can detect is 250 copies / mL. A negative result does not preclude SARS-CoV-2 infection and should not be used as the sole basis for treatment or other patient management decisions.  A negative result may occur with improper specimen collection / handling, submission of specimen other than nasopharyngeal swab, presence of viral mutation(s) within the areas targeted by this assay, and inadequate number of viral copies (<250 copies / mL). A negative result must be combined with clinical observations, patient history, and epidemiological information.  Fact Sheet for Patients:   https://www.patel.info/  Fact Sheet for Healthcare Providers: https://hall.com/  This test is not yet approved or  cleared by the [place] FDA and has been authorized for detection and/or diagnosis of SARS-CoV-2 by FDA under an Emergency Use Authorization (EUA).  This EUA will remain in effect (meaning this test can be used) for the duration of the COVID-19 declaration under Section 564(b)(1) of the Act, 21 U.S.C. section 360bbb-3(b)(1), unless the authorization is terminated or revoked sooner.  Performed at [facility], [address], [place], [place] [address]   SARS Coronavirus 2 by RT PCR (hospital order, performed in [facility] hospital lab) *cepheid single result test* Anterior Nasal Swab     Status: None   Collection Time: 06/18/22  2:03 PM   Specimen: Anterior Nasal Swab  Result Value Ref Range Status   SARS Coronavirus 2 by RT PCR NEGATIVE NEGATIVE Final    Comment: (NOTE) SARS-CoV-2 target nucleic acids are NOT DETECTED.  The SARS-CoV-2 RNA is generally detectable in upper and lower respiratory specimens during the acute phase of infection. The lowest concentration of SARS-CoV-2 viral copies this  assay can detect is 250 copies / mL. A negative result does not preclude SARS-CoV-2 infection and should not be used as the sole basis for treatment or other patient management decisions.  A negative result may occur with improper specimen collection / handling, submission of specimen other than nasopharyngeal swab, presence of viral mutation(s) within the areas targeted by this assay, and inadequate number of viral copies (<250 copies / mL). A negative result must be combined with clinical observations, patient history, and epidemiological information.  Fact Sheet for Patients:   https://www.patel.info/  Fact Sheet for Healthcare Providers: https://hall.com/  This test is not yet approved or  cleared by the [place] FDA and has been authorized for detection and/or diagnosis of SARS-CoV-2 by FDA under an Emergency Use Authorization (EUA).  This EUA will remain in effect (meaning this test can be used) for the duration of the COVID-19 declaration under Section 564(b)(1) of the Act, 21 U.S.C. section 360bbb-3(b)(1), unless the authorization is terminated or revoked sooner.  Performed at [facility], [address] [place], [place] [address]   SARS Coronavirus 2 by RT PCR (hospital order, performed in [facility] hospital lab) *cepheid single result test* Anterior Nasal Swab     Status: None   Collection Time: 06/21/22  9:07 AM   Specimen: Anterior Nasal Swab  Result Value Ref Range Status   SARS Coronavirus 2 by RT PCR NEGATIVE NEGATIVE Final    Comment: (NOTE) SARS-CoV-2 target nucleic acids are NOT DETECTED.  The SARS-CoV-2 RNA is generally detectable in upper and lower respiratory specimens during the acute phase of infection. The lowest concentration of SARS-CoV-2 viral copies this assay can detect is 250 copies / mL. A negative result does not preclude SARS-CoV-2 infection and should not be used as the sole basis for treatment  or other patient management decisions.  A negative result may occur with improper specimen collection / handling, submission of specimen other than nasopharyngeal swab, presence of viral mutation(s) within the areas targeted by this assay, and inadequate number of viral copies (<250 copies / mL). A negative result must be combined with clinical observations, patient history, and epidemiological information.  Fact Sheet for Patients:   https://www.patel.info/  Fact Sheet for Healthcare Providers: https://hall.com/  This test is not yet approved or  cleared by the [place] FDA and has been authorized for detection and/or diagnosis of SARS-CoV-2 by FDA under an Emergency Use Authorization (EUA).  This EUA will remain in effect (meaning this test can be used) for the duration of the COVID-19 declaration under Section 564(b)(1) of the Act, 21 U.S.C. section 360bbb-3(b)(1), unless the authorization is terminated or revoked sooner.  Performed at [facility], [address], [place], [place] [address]     Antimicrobials: Anti-infectives (From admission, onward)    None      Culture/Microbiology    Component Value Date/Time   SDES  04/09/2020 1100    URINE, RANDOM Performed at [facility], [address] [place],  [place] [address]    SPECREQUEST  04/09/2020 1100    NONE Performed at [facility], [address], [place], [place] [address]    CULT (A) 04/09/2020 1100    <10,000 COLONIES/mL INSIGNIFICANT GROWTH Performed at [facility] [address] [place], [place] [address]    REPTSTATUS 04/10/2020 FINAL 04/09/2020 1100    Other culture-see note  Radiology Studies: No results found.   LOS: 5 days   Antonieta Pert, MD [facility]  06/22/2022, 10:39 AM

## 2022-06-22 NOTE — Progress Notes (Signed)
Date and time results received: 06/22/22 1645 (use smartphrase ".now" to insert current time)  Test: WBC, PLATELET Critical Value: WBC 0.6, PLATELET 28  Name of Provider Notified: Maren Beach

## 2022-06-22 NOTE — Progress Notes (Signed)
   06/22/22 1556  Assess: MEWS Score  Temp (!) 100.8 F (38.2 C)  BP (!) 109/53  MAP (mmHg) 70  Pulse Rate 75  Resp (!) 32  Level of Consciousness Alert  SpO2 99 %  O2 Device Room Air  Assess: MEWS Score  MEWS Temp 1  MEWS Systolic 0  MEWS Pulse 0  MEWS RR 2  MEWS LOC 0  MEWS Score 3  MEWS Score Color Yellow  Treat  Pain Scale 0-10  Pain Score 0  Assess: SIRS CRITERIA  SIRS Temperature  0  SIRS Pulse 0  SIRS Respirations  1  SIRS WBC 1  SIRS Score Sum  2

## 2022-06-22 NOTE — TOC Progression Note (Signed)
Transition of Care [facility]) - Progression Note    Patient Details  Name: Darius Smith MRN: [id number] Date of Birth: 1945-11-18  [facility] of [facility] CM/SW Contact  Shade Flood, LCSW Phone Number: 06/22/2022, 12:06 PM  Clinical Narrative:     Per MD, pt not yet medically stable for dc. Updated Kerri at [facility]. TOC will follow.  Expected Discharge Plan: [place] Barriers to Discharge: Continued Medical Work up  Expected Discharge Plan and Services Expected Discharge Plan: [place]                                               Social Determinants of Health (SDOH) Interventions Housing Interventions: Intervention Not Indicated  Readmission Risk Interventions    08/04/2020    4:53 PM 08/01/2020    9:34 AM  Readmission Risk Prevention Plan  Transportation Screening Complete Complete  HRI or Home Care Consult Complete Complete  Social Work Consult for [place] Planning/Counseling Complete Complete  Palliative Care Screening Complete Not Applicable  Medication Review Press photographer) Complete Complete

## 2022-06-23 ENCOUNTER — Inpatient Hospital Stay (HOSPITAL_COMMUNITY): Payer: Medicare Other

## 2022-06-23 DIAGNOSIS — I959 Hypotension, unspecified: Secondary | ICD-10-CM

## 2022-06-23 DIAGNOSIS — I9589 Other hypotension: Secondary | ICD-10-CM | POA: Diagnosis not present

## 2022-06-23 DIAGNOSIS — E861 Hypovolemia: Secondary | ICD-10-CM | POA: Diagnosis not present

## 2022-06-23 LAB — BLOOD CULTURE ID PANEL (REFLEXED) - BCID2

## 2022-06-23 LAB — TYPE AND SCREEN
ABO/RH(D): O POS
Antibody Screen: NEGATIVE
Unit division: 0

## 2022-06-23 LAB — CBC
HCT: 24.6 % — ABNORMAL LOW (ref 39.0–52.0)
HCT: 28.5 % — ABNORMAL LOW (ref 39.0–52.0)
Hemoglobin: 8 g/dL — ABNORMAL LOW (ref 13.0–17.0)
Hemoglobin: 9.2 g/dL — ABNORMAL LOW (ref 13.0–17.0)
MCH: 31.4 pg (ref 26.0–34.0)
MCH: 31.7 pg (ref 26.0–34.0)
MCHC: 32.3 g/dL (ref 30.0–36.0)
MCHC: 32.5 g/dL (ref 30.0–36.0)
MCV: 96.5 fL (ref 80.0–100.0)
MCV: 98.3 fL (ref 80.0–100.0)
Platelets: 28 10*3/uL — CL (ref 150–400)
Platelets: 43 10*3/uL — ABNORMAL LOW (ref 150–400)
RBC: 2.55 MIL/uL — ABNORMAL LOW (ref 4.22–5.81)
RBC: 2.9 MIL/uL — ABNORMAL LOW (ref 4.22–5.81)
RDW: 15.2 % (ref 11.5–15.5)
RDW: 15.7 % — ABNORMAL HIGH (ref 11.5–15.5)
WBC: 0.6 10*3/uL — CL (ref 4.0–10.5)
WBC: 5.3 10*3/uL (ref 4.0–10.5)
nRBC: 0 % (ref 0.0–0.2)
nRBC: 0 % (ref 0.0–0.2)

## 2022-06-23 LAB — COMPREHENSIVE METABOLIC PANEL
ALT: 53 U/L — ABNORMAL HIGH (ref 0–44)
AST: 44 U/L — ABNORMAL HIGH (ref 15–41)
Albumin: 1.7 g/dL — ABNORMAL LOW (ref 3.5–5.0)
Alkaline Phosphatase: 87 U/L (ref 38–126)
Anion gap: 9 (ref 5–15)
BUN: 44 mg/dL — ABNORMAL HIGH (ref 8–23)
CO2: 18 mmol/L — ABNORMAL LOW (ref 22–32)
Calcium: 7.2 mg/dL — ABNORMAL LOW (ref 8.9–10.3)
Chloride: 112 mmol/L — ABNORMAL HIGH (ref 98–111)
Creatinine, Ser: 2.2 mg/dL — ABNORMAL HIGH (ref 0.61–1.24)
GFR, Estimated: 30 mL/min — ABNORMAL LOW (ref >60)
Glucose, Bld: 166 mg/dL — ABNORMAL HIGH (ref 70–99)
Potassium: 4.4 mmol/L (ref 3.5–5.1)
Sodium: 139 mmol/L (ref 135–145)
Total Bilirubin: 3.9 mg/dL — ABNORMAL HIGH (ref 0.3–1.2)
Total Protein: 4.1 g/dL — ABNORMAL LOW (ref 6.5–8.1)

## 2022-06-23 LAB — GLUCOSE, CAPILLARY
Glucose-Capillary: 161 mg/dL — ABNORMAL HIGH (ref 70–99)
Glucose-Capillary: 152 mg/dL — ABNORMAL HIGH (ref 70–99)

## 2022-06-23 LAB — BPAM RBC
Blood Product Expiration Date: 202311062359
ISSUE DATE / TIME: 202310031108
Unit Type and Rh: 5100

## 2022-06-23 LAB — CORTISOL-AM, BLOOD: Cortisol - AM: 47.5 ug/dL — ABNORMAL HIGH (ref 6.7–22.6)

## 2022-06-23 MED ORDER — SODIUM CHLORIDE 0.9 % IV BOLUS
500.0000 mL | Freq: Once | INTRAVENOUS | Status: AC
  Administered 2022-06-23: 500 mL via INTRAVENOUS

## 2022-06-23 MED ORDER — SODIUM CHLORIDE 0.9 % IV SOLN
250.0000 mL | INTRAVENOUS | Status: DC
  Administered 2022-06-23: 250 mL via INTRAVENOUS

## 2022-06-23 MED ORDER — SODIUM CHLORIDE 0.9 % IV SOLN
2.0000 g | INTRAVENOUS | Status: DC
  Administered 2022-06-23 – 2022-06-24 (×2): 2 g via INTRAVENOUS
  Filled 2022-06-23 (×2): qty 20

## 2022-06-23 MED ORDER — NOREPINEPHRINE 4 MG/250ML-% IV SOLN
2.0000 ug/min | INTRAVENOUS | Status: DC
  Administered 2022-06-24: 2 ug/min via INTRAVENOUS
  Filled 2022-06-23 (×2): qty 250

## 2022-06-23 MED ORDER — SODIUM CHLORIDE 0.9 % IV SOLN: INTRAVENOUS | Status: AC

## 2022-06-23 NOTE — Progress Notes (Signed)
   06/23/22 1630  Assess: MEWS Score  Temp 98.7 F (37.1 C)  BP (!) 78/44  MAP (mmHg) (!) 55  Pulse Rate 63  Resp 20  Level of Consciousness Alert  SpO2 95 %  O2 Device Room Air  Assess: MEWS Score  MEWS Temp 0  MEWS Systolic 2  MEWS Pulse 0  MEWS RR 0  MEWS LOC 0  MEWS Score 2  MEWS Score Color Yellow  Assess: if the MEWS score is Yellow or Red  Were vital signs taken at a resting state? No  Focused Assessment No change from prior assessment  Does the patient meet 2 or more of the SIRS criteria? No  MEWS guidelines implemented *See Row Information* No, previously yellow, continue vital signs every 4 hours  Notify: Charge Nurse/RN  Name of Charge Nurse/RN Audiological scientist  Date Charge Nurse/RN Notified 06/23/22  Time Charge Nurse/RN Notified 86  Notify: Provider  Provider Name/Title Dr. Jacinta Shoe  Date Provider Notified 06/23/22  Method of Notification  (secure chat)  Assess: SIRS CRITERIA  SIRS Temperature  0  SIRS Pulse 0  SIRS Respirations  0  SIRS WBC 1  SIRS Score Sum  1

## 2022-06-23 NOTE — Progress Notes (Addendum)
   06/23/22 2308  Vitals  Temp 97.7 F (36.5 C)  BP (!) 82/53  MAP (mmHg) (!) 63  BP Location Right Arm  BP Method Automatic  Patient Position (if appropriate) Lying  Pulse Rate (!) 54  Pulse Rate Source Monitor  Resp 16  MEWS COLOR  MEWS Score Color Green  Oxygen Therapy  SpO2 96 %  O2 Device Room Air  MEWS Score  MEWS Temp 0  MEWS Systolic 1  MEWS Pulse 0  MEWS RR 0  MEWS LOC 0  MEWS Score 1   Dr. Josephine Cables notified.

## 2022-06-23 NOTE — Plan of Care (Signed)
  Problem: Acute Rehab OT Goals (only OT should resolve) Goal: Pt. Will Perform Grooming Flowsheets (Taken 06/23/2022 0933) Pt Will Perform Grooming:  with min guard assist  standing Goal: Pt. Will Perform Lower Body Bathing Flowsheets (Taken 06/23/2022 0933) Pt Will Perform Lower Body Bathing:  with min guard assist  sitting/lateral leans  with adaptive equipment Goal: Pt. Will Perform Lower Body Dressing Flowsheets (Taken 06/23/2022 0933) Pt Will Perform Lower Body Dressing:  with min guard assist  sitting/lateral leans  with adaptive equipment Goal: Pt. Will Transfer To Toilet Flowsheets (Taken 06/23/2022 [phone]) Pt Will Transfer to Toilet:  with supervision  with min guard assist  stand pivot transfer Goal: Pt. Will Perform Toileting-Clothing Manipulation Flowsheets (Taken 06/23/2022 0933) Pt Will Perform Toileting - Clothing Manipulation and hygiene:  with min assist  sitting/lateral leans Goal: Pt/Caregiver Will Perform Home Exercise Program Flowsheets (Taken 06/23/2022 [phone]) Pt/caregiver will Perform Home Exercise Program:  Increased strength  Both right and left upper extremity  Independently  Rocklyn Mayberry OT, MOT

## 2022-06-23 NOTE — TOC Progression Note (Signed)
Transition of Care [facility]) - Progression Note    Patient Details  Name: Darius Smith MRN: [id number] Date of Birth: 01-Aug-1946  Transition of Care [facility]) CM/SW [place], [place] Phone Number: 06/23/2022, 1:44 PM  Clinical Narrative:    Pts insurance auth runs out today for SNF. TOC to restart auth tomorrow. TOC to follow.   Expected Discharge Plan: [place] Barriers to Discharge: Continued Medical Work up  Expected Discharge Plan and Services Expected Discharge Plan: [place]                                               Social Determinants of Health (SDOH) Interventions Housing Interventions: Intervention Not Indicated  Readmission Risk Interventions    08/04/2020    4:53 PM 08/01/2020    9:34 AM  Readmission Risk Prevention Plan  Transportation Screening Complete Complete  HRI or Home Care Consult Complete Complete  Social Work Consult for [place] Planning/Counseling Complete Complete  Palliative Care Screening Complete Not Applicable  Medication Review Press photographer) Complete Complete

## 2022-06-23 NOTE — Progress Notes (Signed)
Notified Dr. Jacinta Shoe via secure chat that patient's BP was low and that he had not voided all day

## 2022-06-23 NOTE — Progress Notes (Signed)
   06/23/22 2045  Vitals  Temp 97.8 F (36.6 C)  Temp Source Oral  BP (!) 78/49  MAP (mmHg) (!) 59  BP Method Automatic  Patient Position (if appropriate) Lying  Pulse Rate 61  Pulse Rate Source Monitor  Resp 20  MEWS COLOR  MEWS Score Color Yellow  Oxygen Therapy  SpO2 97 %  O2 Device Room Air  MEWS Score  MEWS Temp 0  MEWS Systolic 2  MEWS Pulse 0  MEWS RR 0  MEWS LOC 0  MEWS Score 2   Dr. Josephine Cables notified. New order for 562ml bolus over 2hrs

## 2022-06-23 NOTE — Progress Notes (Signed)
Date and time results received: 06/23/22 0634 (use smartphrase ".now" to insert current time)  Test: Blood cultures  Critical Value: Positive for gram negative rods   Name of Provider Notified:Dr.Ghosh  Orders Received? Or Actions Taken?:  NNO at this time.

## 2022-06-23 NOTE — Progress Notes (Signed)
   06/22/22 2219  Vitals  Temp 97.6 F (36.4 C)  Temp Source Oral  BP (!) 78/59  BP Location Right Arm  BP Method  (dinamap)  Patient Position (if appropriate) Lying  Pulse Rate 89  Pulse Rate Source Dinamap  Resp 14  MEWS COLOR  MEWS Score Color Yellow  Oxygen Therapy  SpO2 93 %  O2 Device Room Air  Pain Assessment  Pain Scale 0-10  Pain Score 0  MEWS Score  MEWS Temp 0  MEWS Systolic 2  MEWS Pulse 0  MEWS RR 0  MEWS LOC 0  MEWS Score 2  Provider Notification  Provider Name/Title Dr. Orlin Hilding  Date Provider Notified 06/22/22  Time Provider Notified 2232  Method of Notification Page  Notification Reason Other (Comment) (BP low)  Provider response See new orders  Date of Provider Response 06/22/22 (2234)

## 2022-06-23 NOTE — Plan of Care (Signed)
  Problem: Education: Goal: Knowledge of General Education information will improve Description Including pain rating scale, medication(s)/side effects and non-pharmacologic comfort measures Outcome: Progressing   Problem: Health Behavior/Discharge Planning: Goal: Ability to manage health-related needs will improve Outcome: Progressing   

## 2022-06-23 NOTE — Progress Notes (Addendum)
Removed to change  note.

## 2022-06-23 NOTE — Progress Notes (Signed)
   06/23/22 0343  Vitals  Temp 99.3 F (37.4 C)  BP (!) 144/68  MAP (mmHg) 90  BP Location Right Arm  BP Method Automatic  Patient Position (if appropriate) Lying  Pulse Rate (!) 104  Pulse Rate Source Monitor  Resp (!) 31  MEWS COLOR  MEWS Score Color Yellow  Oxygen Therapy  SpO2 100 %  O2 Device Room Air  MEWS Score  MEWS Temp 0  MEWS Systolic 0  MEWS Pulse 1  MEWS RR 2  MEWS LOC 0  MEWS Score 3   Dr. Orlin Hilding notified. New order for CXR. PRN breathing treatment ordered

## 2022-06-23 NOTE — Progress Notes (Signed)
RN called due to patient's BP at 78/49, IV fluid bolus 500 mL was given, but patient continued to be hypotensive with BP at 85/50, patient was transferred to ICU and was started on peripheral pressor (Levophed) to maintain a MAP of at least 65.

## 2022-06-23 NOTE — Progress Notes (Signed)
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  As per the report, he felt dizzy, passed out transiently.  He was reporting intermittent dizziness over the last few days, poor oral intake.   -He was recently hospitalized here and was discharged on 9/25 after being managed for left femur fracture status post ORIF by Dr. Amedeo Kinsman.  On presentation, he was hypotensive, given IV fluids.  Hemoglobin was 8.1.  Creatinine was 3.1, sodium of 129.   Patient was admitted for hypotension and syncope BP stabilized home antihypertensives held today with IV fluids, AKI on CKD stage IIIb improving from baseline creatinine of 1.5-1.8.  Creatinine improved to 1.6. Patient also anemia pancytopenia FOBT negative suspect anemia due to chemotherapy for NHL and anemia of chronic disease.  Hemoglobin overall stable, status post 1 unit PRBC transfusion -10/3-4: Blood cultures with GNR   Subjective: -Tired, had a transfusion reaction yesterday  Assessment and Plan:  Ecoli bacteremia Hypotension, syncope -Blood pressure is still soft, continue IV fluids today, continue IV cefepime -Suspect urinary source for bacteremia, urine culture growing E. coli as well -Sensitivities pending, repeat blood cultures  -Antihypertensives discontinued -History of brain mets, will check random cortisol level as well  AKI on CKD stage IIIb baseline creatinine 1.5-1.8 on presentation 3.1  -Improving, continue IV fluids 1 more day  Anemia normocytic likely multifactoria Acute blood loss anemia due to hematoma/from recent left hip fracture and surgery and also  anemia of chronic disease and chemotherapy for NHL: FOBT was negative.  S/P 2 unit of  PRBC this admission  he has bruise on his left thigh, had a recent intramedullary nailing of the left femur.  Thrombocytopenia/leukopenia likely secondary to lymphoma and rituximab, worsened by sepsis, bacteremia  B12 folate LDH normal. Hematology/oncology- Dr. Delton Coombes following-appreciate input.MMA pending.  Platelet count improving as below  Chronic combined systolic/diastolic  -last echo with EF 50% G1 DD in 2022.   -Hold Lasix today, continue IV fluids 1 more day  Permanent A-fib: Rate controlled on amiodarone.  Eliquis held this admission due to patient's anemia.  Dr.KC Discussed with patient and his wife at the bedside who agreed with holding for now will need to be reassessed. He is followed by cardiologist Dr. Harl Bowie as OP. He had  A-fib with RVR on 9/29 and placed on low-dose metoprolol but discontinued due to soft BP 06/20/22 -Hopefully resume anticoagulation soon  NHL w/ CNS involvement last PET scan and MRI of the brain on 06/02/2022 showed complete response to therapy.Followed by heme oncology outpatient   Type 2 diabetes A1c stable in 5,blood sugar well controlled.  Continue to monitor  History of comminuted intertrochanteric fracture of left femur ,s/p ORIF on 9/22.Wound looks clean but was suspected to have intramuscular hematoma.CT hip showed finding of small scattered hematoma but negative for large hematoma or bleeding.  Eliquis remains on hold.  Plan is for skilled nursing facility  Constipation no BM for 3 to 4 days started MiraLAX daily 10/2  DVT prophylaxis: SCDs Start: 06/16/22 1737 Code Status:   Code Status: DNR Family Communication: Discussed with patient's family at bedside Patient status is: Inpatient because of ongoing monitoring of his blood counts Level of care: Telemetry   Dispo: The patient is from:  snf            Anticipated disposition: snf in 2 to 3 days days   Mobility Assessment (last 72 hours)     Mobility Assessment     Row Name 06/23/22  0900 06/23/22 0758 06/22/22 2200 06/22/22 1200 06/21/22 2000   Does patient have an order for bedrest or is patient medically unstable -- No - Continue assessment No - Continue assessment No - Continue assessment No - Continue assessment   What is the highest level of mobility based on the progressive mobility assessment? Level 4 (Walks with assist in room) - Balance while marching in place and cannot step forward and back - Complete Level 3 (Stands with assist) - Balance while standing  and cannot march in place Level 3 (Stands with assist) - Balance while standing  and cannot march in place Level 3 (Stands with assist) - Balance while standing  and cannot march in place Level 3 (Stands with assist) - Balance while standing  and cannot march in place   Is the above level different from baseline mobility prior to current illness? -- -- Yes - Recommend PT order Yes - Recommend PT order --    [place] Name 06/21/22 1130           Does patient have an order for bedrest or is patient medically unstable No - Continue assessment               Objective: Vitals last 24 hrs: Vitals:   06/23/22 0339 06/23/22 0343 06/23/22 0514 06/23/22 0950  BP:  (!) 144/68 (!) 98/44 (!) 85/47  Pulse:  (!) 104 63   Resp:  (!) 31 (!) 27 20  Temp:  99.3 F (37.4 C) 99.9 F (37.7 C) (!) 97.4 F (36.3 C)  TempSrc:    Oral  SpO2: 96% 100% 96% 100%  Weight:      Height:       Weight change:   Physical Examination: Chronically ill elderly male sitting up in bed, AAO x2, no distress HEENT: No JVD CVS: S1-S2, regular rhythm Lungs: Poor air movement bilaterally Abdomen: Soft, nontender, bowel sounds present Extremities: Trace edema, left hip fracture site with sutures, no redness swelling or drainage  Skin: No rashes,no icterus. MSK: Normal muscle bulk,tone, power   Medications reviewed:  Scheduled Meds:  amiodarone  200 mg Oral Daily   ferrous sulfate  325 mg Oral Q breakfast   polyethylene glycol  17 g  Oral Daily   sodium bicarbonate  650 mg Oral BID   Continuous Infusions:  sodium chloride     ceFEPime (MAXIPIME) IV 2 g (06/23/22 4268)     Diet Order             Diet regular Room service appropriate? Yes; Fluid consistency: Thin  Diet effective now                  Intake/Output Summary (Last 24 hours) at 06/23/2022 1018 Last data filed at 06/23/2022 0515 Gross per 24 hour  Intake 782 ml  Output 650 ml  Net 132 ml   Net IO Since Admission: -904.18 mL [06/23/22 1018]  Wt Readings from Last 3 Encounters:  06/16/22 80.3 kg  06/09/22 81.3 kg  05/18/22 81.9 kg     Unresulted Labs (From admission, onward)     Start     Ordered   06/23/22 0500  Comprehensive metabolic panel  Daily at 5am,   R  Question:  Specimen collection method  Answer:  Lab=Lab collect   06/22/22 1636   06/22/22 1655  CBC with Differential/Platelet  ONCE - STAT,   STAT       Question:  Specimen collection method  Answer:  Lab=Lab collect   06/22/22 1654   06/22/22 0725  Prepare RBC (crossmatch)  (Adult Blood Administration - Red Blood Cells)  Once,   R       Question Answer Comment  # of Units 1 unit   Transfusion Indications Symptomatic Anemia   Number of Units to Keep Ahead NO units ahead   If emergent release call blood bank Not emergent release      06/22/22 0724   06/22/22 1610  Basic metabolic panel  Daily at 5am,   R     Question:  Specimen collection method  Answer:  Unit=Unit collect   06/21/22 1012   06/22/22 0500  CBC  Daily at 5am,   R     Question:  Specimen collection method  Answer:  Unit=Unit collect   06/21/22 1012          Data Reviewed: I have personally reviewed following labs and imaging studies CBC: Recent Labs  Lab 06/16/22 1323 06/17/22 0430 06/21/22 0704 06/22/22 0450 06/22/22 0646 06/22/22 1545 06/22/22 1857 06/23/22 0028  WBC 5.4   < > 3.4* 3.1*  --  0.6* 2.0* 5.3  NEUTROABS 4.2  --   --   --   --   --  1.8  --   HGB 8.1*   < > 7.7* 6.9* 6.8* 9.2*  8.4* 8.0*  HCT 24.2*   < > 22.9* 20.9* 20.7* 28.5* 25.1* 24.6*  MCV 94.9   < > 95.8 96.3  --  98.3 94.0 96.5  PLT 59*   < > 55* 61*  --  28* 35* 43*   < > = values in this interval not displayed.   Basic Metabolic Panel: Recent Labs  Lab 06/16/22 1323 06/17/22 0430 06/20/22 0611 06/21/22 0704 06/22/22 0450 06/22/22 1545 06/23/22 0028  NA 129*   < > 134* 135 139 136 139  K 3.7   < > 3.7 3.7 3.8 4.2 4.4  CL 98   < > 110 112* 113* 113* 112*  CO2 19*   < > 18* 18* 19* 15* 18*  GLUCOSE 153*   < > 107* 111* 101* 112* 166*  BUN 68*   < > 43* 44* 39* 42* 44*  CREATININE 3.16*   < > 1.71* 1.61* 1.48* 1.91* 2.20*  CALCIUM 8.0*   < > 7.1* 7.3* 7.5* 7.5* 7.2*  MG 2.2  --  1.9  --   --   --   --    < > = values in this interval not displayed.   GFR: Estimated Creatinine Clearance: 31.4 mL/min (A) (by C-G formula based on SCr of 2.2 mg/dL (H)). Liver Function Tests: Recent Labs  Lab 06/16/22 1323 06/22/22 1545 06/23/22 0028  AST 20 47* 44*  ALT 16 57* 53*  ALKPHOS 48 88 87  BILITOT 1.4* 3.2* 3.9*  PROT 6.0* 4.6* 4.1*  ALBUMIN 2.5* 1.9* 1.7*   No results for input(s): "LIPASE", "AMYLASE" in the last 168 hours. No results for input(s): "AMMONIA" in the last 168 hours. Coagulation Profile: Recent Labs  Lab 06/16/22 1323  INR 2.4*   BNP (last 3 results) No results for input(s): "PROBNP" in the last 8760 hours. HbA1C: No results for input(s): "HGBA1C" in the last  72 hours. CBG: Recent Labs  Lab 06/20/22 0515 06/21/22 0358 06/22/22 0456 06/23/22 0545 06/23/22 0718  GLUCAP 112* 112* 95 161* 152*   Lipid Profile: No results for input(s): "CHOL", "HDL", "LDLCALC", "TRIG", "CHOLHDL", "LDLDIRECT" in the last 72 hours. Thyroid Function Tests: No results for input(s): "TSH", "T4TOTAL", "FREET4", "T3FREE", "THYROIDAB" in the last 72 hours. Sepsis Labs: Recent Labs  Lab 06/22/22 1805 06/22/22 2005  LATICACIDVEN 1.9 1.8    Recent Results (from the past 240 hour(s))   SARS Coronavirus 2 by RT PCR (hospital order, performed in [facility] hospital lab) *cepheid single result test* Anterior Nasal Swab     Status: None   Collection Time: 06/14/22 10:42 AM   Specimen: Anterior Nasal Swab  Result Value Ref Range Status   SARS Coronavirus 2 by RT PCR NEGATIVE NEGATIVE Final    Comment: (NOTE) SARS-CoV-2 target nucleic acids are NOT DETECTED.  The SARS-CoV-2 RNA is generally detectable in upper and lower respiratory specimens during the acute phase of infection. The lowest concentration of SARS-CoV-2 viral copies this assay can detect is 250 copies / mL. A negative result does not preclude SARS-CoV-2 infection and should not be used as the sole basis for treatment or other patient management decisions.  A negative result may occur with improper specimen collection / handling, submission of specimen other than nasopharyngeal swab, presence of viral mutation(s) within the areas targeted by this assay, and inadequate number of viral copies (<250 copies / mL). A negative result must be combined with clinical observations, patient history, and epidemiological information.  Fact Sheet for Patients:   https://www.patel.info/  Fact Sheet for Healthcare Providers: https://hall.com/  This test is not yet approved or  cleared by the [place] FDA and has been authorized for detection and/or diagnosis of SARS-CoV-2 by FDA under an Emergency Use Authorization (EUA).  This EUA will remain in effect (meaning this test can be used) for the duration of the COVID-19 declaration under Section 564(b)(1) of the Act, 21 U.S.C. section 360bbb-3(b)(1), unless the authorization is terminated or revoked sooner.  Performed at [facility], [address] [place], [place] [address]   SARS Coronavirus 2 by RT PCR (hospital order, performed in [facility] hospital lab) *cepheid single result test* Anterior Nasal Swab     Status: None    Collection Time: 06/18/22  2:03 PM   Specimen: Anterior Nasal Swab  Result Value Ref Range Status   SARS Coronavirus 2 by RT PCR NEGATIVE NEGATIVE Final    Comment: (NOTE) SARS-CoV-2 target nucleic acids are NOT DETECTED.  The SARS-CoV-2 RNA is generally detectable in upper and lower respiratory specimens during the acute phase of infection. The lowest concentration of SARS-CoV-2 viral copies this assay can detect is 250 copies / mL. A negative result does not preclude SARS-CoV-2 infection and should not be used as the sole basis for treatment or other patient management decisions.  A negative result may occur with improper specimen collection / handling, submission of specimen other than nasopharyngeal swab, presence of viral mutation(s) within the areas targeted by this assay, and inadequate number of viral copies (<250 copies / mL). A negative result must be combined with clinical observations, patient history, and epidemiological information.  Fact Sheet for Patients:   https://www.patel.info/  Fact Sheet for Healthcare Providers: https://hall.com/  This test is not yet approved or  cleared by the [place] FDA and has been authorized for detection and/or diagnosis of SARS-CoV-2 by FDA under an Emergency Use Authorization (EUA).  This EUA will remain in effect (meaning this test can be used) for the duration of the COVID-19 declaration under Section 564(b)(1) of the Act, 21 U.S.C. section 360bbb-3(b)(1), unless the authorization is terminated or revoked sooner.  Performed at [facility], [address] [place], [place] [address]   SARS Coronavirus 2 by RT PCR (hospital order, performed in [facility] hospital lab) *cepheid single result test* Anterior Nasal Swab     Status: None   Collection Time: 06/21/22  9:07 AM   Specimen: Anterior Nasal Swab  Result Value Ref Range Status   SARS Coronavirus 2 by RT PCR NEGATIVE NEGATIVE  Final    Comment: (NOTE) SARS-CoV-2 target nucleic acids are NOT DETECTED.  The SARS-CoV-2 RNA is generally detectable in upper and lower respiratory specimens during the acute phase of infection. The lowest concentration of SARS-CoV-2 viral copies this assay can detect is 250 copies / mL. A negative result does not preclude SARS-CoV-2 infection and should not be used as the sole basis for treatment or other patient management decisions.  A negative result may occur with improper specimen collection / handling, submission of specimen other than nasopharyngeal swab, presence of viral mutation(s) within the areas targeted by this assay, and inadequate number of viral copies (<250 copies / mL). A negative result must be combined with clinical observations, patient history, and epidemiological information.  Fact Sheet for Patients:   https://www.patel.info/  Fact Sheet for Healthcare Providers: https://hall.com/  This test is not yet approved or  cleared by the [place] FDA and has been authorized for detection and/or diagnosis of SARS-CoV-2 by FDA under an Emergency Use Authorization (EUA).  This EUA will remain in effect (meaning this test can be used) for the duration of the COVID-19 declaration under Section 564(b)(1) of the Act, 21 U.S.C. section 360bbb-3(b)(1), unless the authorization is terminated or revoked sooner.  Performed at [facility], [address], [place], [place] [address]   Urine Culture     Status: Abnormal (Preliminary result)   Collection Time: 06/21/22  8:31 PM   Specimen: Urine, Clean Catch  Result Value Ref Range Status   Specimen Description   Final    URINE, CLEAN CATCH Performed at [facility], [address] [place], [place] [address]    Special Requests   Final    NONE Performed at [facility], [address], [place], [place] [address]    Culture (A)  Final    >=100,000 COLONIES/mL ESCHERICHIA  COLI SUSCEPTIBILITIES TO FOLLOW Performed at [facility] [address], [place], [place] [address]    Report Status PENDING  Incomplete  Culture, blood (Routine X 2) w Reflex to ID Panel     Status: None (Preliminary result)   Collection Time: 06/22/22  6:05 PM   Specimen: BLOOD RIGHT HAND  Result Value Ref Range Status   Specimen Description   Final    BLOOD RIGHT HAND Performed at [facility], [address] [place], [place] [address]    Special Requests   Final    BOTTLES DRAWN AEROBIC AND ANAEROBIC Blood Culture adequate volume Performed at [facility], [address], [place], [place] [address]    Culture  Setup Time   Final    GRAM NEGATIVE RODS Gram Stain Report Called to,Read Back By and Verified With: HEARP N. AT [id number] ON [id number] BY THOMPSON S. IN BOTH AEROBIC AND ANAEROBIC BOTTLES CRITICAL RESULT CALLED TO, READ BACK BY AND VERIFIED WITH: PHARMD TINA TALBOTT 06/23/22  0957 BY AB Performed at [facility], [place] [address], [place], [place] [address]    Culture GRAM NEGATIVE RODS  Final   Report Status PENDING  Incomplete  Culture, blood (Routine X 2) w Reflex to ID Panel     Status: None (Preliminary result)   Collection Time: 06/22/22  6:05 PM   Specimen: BLOOD RIGHT HAND  Result Value Ref Range Status   Specimen Description   Final    BLOOD RIGHT HAND Performed at [facility], [address] [place], [place] [address]    Special Requests   Final    BOTTLES DRAWN AEROBIC AND ANAEROBIC Blood Culture adequate volume Performed at [facility], [address], [place], [place] [address]    Culture  Setup Time   Final    GRAM NEGATIVE RODS Gram Stain Report Called to,Read Back By and Verified With: HEARP N. AT [id number] ON [id number] BY THOMPSON S. IN BOTH AEROBIC AND ANAEROBIC BOTTLES Performed at [facility], [address] [place], [place] [address]    Culture GRAM NEGATIVE RODS  Final   Report Status PENDING  Incomplete  Blood Culture ID Panel (Reflexed)      Status: Abnormal   Collection Time: 06/22/22  6:05 PM  Result Value Ref Range Status   Enterococcus faecalis NOT DETECTED NOT DETECTED Final   Enterococcus Faecium NOT DETECTED NOT DETECTED Final   Listeria monocytogenes NOT DETECTED NOT DETECTED Final   Staphylococcus species NOT DETECTED NOT DETECTED Final   Staphylococcus aureus (BCID) NOT DETECTED NOT DETECTED Final   Staphylococcus epidermidis NOT DETECTED NOT DETECTED Final   Staphylococcus lugdunensis NOT DETECTED NOT DETECTED Final   Streptococcus species NOT DETECTED NOT DETECTED Final   Streptococcus agalactiae NOT DETECTED NOT DETECTED Final   Streptococcus pneumoniae NOT DETECTED NOT DETECTED Final   Streptococcus pyogenes NOT DETECTED NOT DETECTED Final   A.calcoaceticus-baumannii NOT DETECTED NOT DETECTED Final   Bacteroides fragilis NOT DETECTED NOT DETECTED Final   Enterobacterales DETECTED (A) NOT DETECTED Final    Comment: Enterobacterales represent a large order of gram negative bacteria, not a single organism. CRITICAL RESULT CALLED TO, READ BACK BY AND VERIFIED WITH: PHARMD T. TALBOTT 06/23/22 @0957  BY AB    Enterobacter cloacae complex NOT DETECTED NOT DETECTED Final   Escherichia coli DETECTED (A) NOT DETECTED Final    Comment: CRITICAL RESULT CALLED TO, READ BACK BY AND VERIFIED WITH: PHARMD T. TALBOTT 06/23/22 @0957  BY AB     Klebsiella aerogenes NOT DETECTED NOT DETECTED Final   Klebsiella oxytoca NOT DETECTED NOT DETECTED Final   Klebsiella pneumoniae NOT DETECTED NOT DETECTED Final   Proteus species NOT DETECTED NOT DETECTED Final   Salmonella species NOT DETECTED NOT DETECTED Final   Serratia marcescens NOT DETECTED NOT DETECTED Final   Haemophilus influenzae NOT DETECTED NOT DETECTED Final   Neisseria meningitidis NOT DETECTED NOT DETECTED Final   Pseudomonas aeruginosa NOT DETECTED NOT DETECTED Final   Stenotrophomonas maltophilia NOT DETECTED NOT DETECTED Final   Candida albicans NOT DETECTED NOT  DETECTED Final   Candida auris NOT DETECTED NOT DETECTED Final   Candida glabrata NOT DETECTED NOT DETECTED Final   Candida krusei NOT DETECTED NOT DETECTED Final   Candida parapsilosis NOT DETECTED NOT DETECTED Final   Candida tropicalis NOT DETECTED NOT DETECTED Final   Cryptococcus neoformans/gattii NOT DETECTED NOT DETECTED Final   CTX-M ESBL NOT DETECTED NOT DETECTED Final   Carbapenem resistance IMP NOT DETECTED NOT DETECTED Final   Carbapenem resistance  KPC NOT DETECTED NOT DETECTED Final   Carbapenem resistance NDM NOT DETECTED NOT DETECTED Final   Carbapenem resist OXA 48 LIKE NOT DETECTED NOT DETECTED Final   Carbapenem resistance VIM NOT DETECTED NOT DETECTED Final    Comment: Performed at [facility], [place] [address] [place], [place] [address]    Antimicrobials: Anti-infectives (From admission, onward)    Start     Dose/Rate Route Frequency Ordered Stop   06/22/22 1800  ceFEPIme (MAXIPIME) 2 g in sodium chloride 0.9 % 100 mL IVPB        2 g 200 mL/hr over 30 Minutes Intravenous Every 12 hours 06/22/22 1708        Culture/Microbiology    Component Value Date/Time   SDES  06/22/2022 1805    BLOOD RIGHT HAND Performed at [facility], [address], [place], [place] [address]    SDES  06/22/2022 1805    BLOOD RIGHT HAND Performed at [facility], [address], [place], [place] [address]    [facility]  06/22/2022 1805    BOTTLES DRAWN AEROBIC AND ANAEROBIC Blood Culture adequate volume Performed at [facility], [address] [place], [place] [address]    [facility]  06/22/2022 1805    BOTTLES DRAWN AEROBIC AND ANAEROBIC Blood Culture adequate volume Performed at [facility], [address] [place], [place] [address]    Ruthine Dose NEGATIVE RODS 06/22/2022 1805   CULT GRAM NEGATIVE RODS 06/22/2022 1805   REPTSTATUS PENDING 06/22/2022 1805   REPTSTATUS PENDING 06/22/2022 1805    Other culture-see note  Radiology Studies: DG CHEST PORT 1  VIEW  Result Date: 06/23/2022 CLINICAL DATA:  Shortness of breath EXAM: PORTABLE CHEST 1 VIEW COMPARISON:  06/22/2022 FINDINGS: Moderate cardiomegaly is unchanged. Left axillary surgical clips. No focal airspace consolidation or pulmonary edema. IMPRESSION: Unchanged cardiomegaly. Electronically Signed   By: Ulyses Jarred M.D.   On: 06/23/2022 04:02   DG Chest Port 1 View  Result Date: 06/22/2022 CLINICAL DATA:  10026 Shortness of breath 10026 EXAM: PORTABLE CHEST 1 VIEW COMPARISON:  September 27, 2021 FINDINGS: The cardiomediastinal silhouette is unchanged in contour.LEFT axillary clips. No pleural effusion. No pneumothorax. Similar appearance of chronic interstitial and bronchial markings. No acute pleuroparenchymal abnormality. Visualized abdomen is unremarkable. IMPRESSION: No acute cardiopulmonary abnormality. Electronically Signed   By: Valentino Saxon M.D.   On: 06/22/2022 14:38   DG Pelvis Portable  Result Date: 06/22/2022 CLINICAL DATA:  Intertrochanteric fracture left femur. EXAM: LEFT FEMUR PORTABLE 2 VIEWS; PORTABLE PELVIS 1-2 VIEWS COMPARISON:  Left femur radiographs 06/11/2022 FINDINGS: Redemonstration of long cephalomedullary nail fixation for intertrochanteric left femoral fracture. There is again near anatomic alignment. Mild medial displacement of lesser trochanter is unchanged. Resolution of the prior postoperative subcutaneous air at the lateral pelvis, lateral proximal thigh, and lateral distal thigh. No perihardware lucency is seen to indicate hardware failure or loosening. Mild medial compartment of the knee a left wrist separate joint space narrowing. IMPRESSION: Status post left femoral intramedullary nail fixation without evidence of hardware failure. Electronically Signed   By: Yvonne Kendall M.D.   On: 06/22/2022 13:05   DG FEMUR PORT MIN 2 VIEWS LEFT  Result Date: 06/22/2022 CLINICAL DATA:  Intertrochanteric fracture left femur. EXAM: LEFT FEMUR PORTABLE 2 VIEWS; PORTABLE  PELVIS 1-2 VIEWS COMPARISON:  Left femur radiographs 06/11/2022 FINDINGS: Redemonstration of long cephalomedullary nail fixation for intertrochanteric left femoral fracture. There is again near anatomic alignment. Mild medial displacement of lesser trochanter is unchanged. Resolution of  the prior postoperative subcutaneous air at the lateral pelvis, lateral proximal thigh, and lateral distal thigh. No perihardware lucency is seen to indicate hardware failure or loosening. Mild medial compartment of the knee a left wrist separate joint space narrowing. IMPRESSION: Status post left femoral intramedullary nail fixation without evidence of hardware failure. Electronically Signed   By: Yvonne Kendall M.D.   On: 06/22/2022 13:05     LOS: 6 days   Domenic Polite, MD [facility]  06/23/2022, 10:18 AM

## 2022-06-23 NOTE — Progress Notes (Addendum)
Physical Therapy Treatment Patient Details Name: Darius Smith MRN: [id number] DOB: 23-Jul-1946 Today's Date: 06/23/2022   History of Present Illness Darius Smith is a 76 y.o. male with medical history significant for CKD 3, COPD, systolic and diastolic CHF, atrial fibrillation, lung cancer with brain mets and Hodgkin's lymphoma.  Patient was brought to the ED from [facility] with reports of passing out.  Patient's spouse and soon-to-be daughter-in-law [name] were at the [facility] center, assisting patient up to the bathroom, when he was upright he felt dizzy, and passed out transiently, he leaned his head against the wall he did not hit his head.  Reports intermittent dizziness over the past few days.  Reports poor oral intake over the past several days, no vomiting, reports 3 bowel movements yesterday.  No abdominal pain.  No chest pain or difficulty breathing.  No cough no fevers no chills.  Reports persistent pain in his left hip where he had recent surgery, he has not been able to bear weight or ambulate    PT Comments    Patient presents in chair (assisted by OT) and agreeable for therapy. Patient demonstrates increased endurance/distance for ambulation today, able to take a few steps forward/backward with mod assist and verbal cueing for hand placement on RW with fair/good carryover, limited mostly due to fatigue. Patient tolerated staying up in chair after therapy. Patient will benefit from continued skilled physical therapy in hospital and recommended venue below to increase strength, balance, endurance for safe ADLs and gait.    Recommendations for follow up therapy are one component of a multi-disciplinary discharge planning process, led by the attending physician.  Recommendations may be updated based on patient status, additional functional criteria and insurance authorization.  Follow Up Recommendations  Skilled nursing-short term rehab (<3 hours/day) Can patient physically be transported by  private vehicle: No   Assistance Recommended at Discharge Intermittent Supervision/Assistance  Patient can return home with the following A lot of help with walking and/or transfers;A lot of help with bathing/dressing/bathroom;Assistance with cooking/housework;Help with stairs or ramp for entrance   Equipment Recommendations  None recommended by PT    Recommendations for Other Services       Precautions / Restrictions Precautions Precautions: Fall Restrictions Weight Bearing Restrictions: Yes LLE Weight Bearing: Weight bearing as tolerated     Mobility  Bed Mobility                    Transfers Overall transfer level: Needs assistance Equipment used: Rolling walker (2 wheels) Transfers: Bed to chair/wheelchair/BSC, Sit to/from Stand Sit to Stand: Mod assist   Step pivot transfers: Mod assist       General transfer comment: requires mod assist for sit to stand/stand to sit with RW, labored movement, requires verbal cueing for hand placement on walker with fair/good carryover    Ambulation/Gait Ambulation/Gait assistance: Mod assist Gait Distance (Feet): 8 Feet Assistive device: Rolling walker (2 wheels)   Gait velocity: decreased     General Gait Details: Patient unsteady on feet, able to side step and take a few steps forward/backward, requires mod assist/RW   Stairs             Wheelchair Mobility    Modified Rankin (Stroke Patients Only)       Balance Overall balance assessment: Needs assistance Sitting-balance support: Feet supported, Bilateral upper extremity supported Sitting balance-Leahy Scale: Good Sitting balance - Comments: seated EOB   Standing balance support: Bilateral upper extremity supported, During functional  activity, Reliant on assistive device for balance Standing balance-Leahy Scale: Poor Standing balance comment: using RW                            Cognition Arousal/Alertness: Awake/alert Behavior  During Therapy: WFL for tasks assessed/performed Overall Cognitive Status: Within Functional Limits for tasks assessed                                          Exercises General Exercises - Lower Extremity Long Arc Quad: AROM, AAROM, Left, 10 reps, Seated Hip Flexion/Marching: AAROM, AROM, Left, 10 reps, Seated Toe Raises: Seated, AROM, Strengthening, Both, 10 reps Heel Raises: Seated, AROM, Strengthening, Both, 10 reps    General Comments        Pertinent Vitals/Pain Pain Assessment Pain Assessment: Faces (Pain only during initiating activity) Faces Pain Scale: Hurts little more Pain Location: L hip Pain Descriptors / Indicators: Sore, Grimacing, Guarding Pain Intervention(s): Limited activity within patient's tolerance, Monitored during session, Repositioned    Home Living Family/patient expects to be discharged to:: Private residence Living Arrangements: Spouse/significant other Available Help at Discharge: Family;Available 24 hours/day Type of Home: House Home Access: Ramped entrance       Home Layout: One level Home Equipment: BSC/3in1;Cane - single point;Transport chair;Shower Land (2 wheels) Additional Comments: per PT note.    Prior Function            PT Goals (current goals can now be found in the care plan section) Acute Rehab PT Goals Patient Stated Goal: return home after rehab PT Goal Formulation: With patient Time For Goal Achievement: 07/07/22 Potential to Achieve Goals: Good Progress towards PT goals: Progressing toward goals    Frequency    Min 3X/week      PT Plan Current plan remains appropriate    Co-evaluation              AM-PAC PT "6 Clicks" Mobility   Outcome Measure  Help needed turning from your back to your side while in a flat bed without using bedrails?: A Little Help needed moving from lying on your back to sitting on the side of a flat bed without using bedrails?: A Lot Help needed  moving to and from a bed to a chair (including a wheelchair)?: A Lot Help needed standing up from a chair using your arms (e.g., wheelchair or bedside chair)?: A Lot Help needed to walk in hospital room?: A Lot Help needed climbing 3-5 steps with a railing? : A Lot 6 Click Score: 13    End of Session   Activity Tolerance: Patient tolerated treatment well;Patient limited by fatigue Patient left: in chair;with call bell/phone within reach Nurse Communication: Mobility status PT Visit Diagnosis: Unsteadiness on feet (R26.81);Other abnormalities of gait and mobility (R26.89);Muscle weakness (generalized) (M62.81)     Time: 4680-3212 PT Time Calculation (min) (ACUTE ONLY): 21 min  Charges:  $Therapeutic Exercise: 8-22 mins $Therapeutic Activity: 8-22 mins                     Zigmund Gottron, SPT

## 2022-06-23 NOTE — Evaluation (Signed)
Occupational Therapy Evaluation Patient Details Name: Darius Smith MRN: [id number] DOB: 1946-06-09 Today's Date: 06/23/2022   History of Present Illness Darius Smith is a 76 y.o. male with medical history significant for CKD 3, COPD, systolic and diastolic CHF, atrial fibrillation, lung cancer with brain mets and Hodgkin's lymphoma.  Patient was brought to the ED from [facility] with reports of passing out.  Patient's spouse and soon-to-be daughter-in-law [name] were at the [facility] center, assisting patient up to the bathroom, when he was upright he felt dizzy, and passed out transiently, he leaned his head against the wall he did not hit his head.  Reports intermittent dizziness over the past few days.  Reports poor oral intake over the past several days, no vomiting, reports 3 bowel movements yesterday.  No abdominal pain.  No chest pain or difficulty breathing.  No cough no fevers no chills.  Reports persistent pain in his left hip where he had recent surgery, he has not been able to bear weight or ambulate   Clinical Impression   Pt agreeable to OT evaluation. Pt pleasant and following commands well. Min to mod A for bed mobility due to difficulty with L LE movement and to push and pull to sit. Mod A for standing and transfer to chair with RW. Pt is generally weak and unsteady on feet. Pt left in chair with call bell within reach, tray in front, and family member present. Pt will benefit from continued OT in the hospital and recommended venue below to increase strength, balance, and endurance for safe ADL's.         Recommendations for follow up therapy are one component of a multi-disciplinary discharge planning process, led by the attending physician.  Recommendations may be updated based on patient status, additional functional criteria and insurance authorization.   Follow Up Recommendations  Skilled nursing-short term rehab (<3 hours/day)    Assistance Recommended at Discharge Intermittent  Supervision/Assistance  Patient can return home with the following A lot of help with walking and/or transfers;A lot of help with bathing/dressing/bathroom;Assistance with cooking/housework;Assist for transportation;Help with stairs or ramp for entrance    Functional Status Assessment  Patient has had a recent decline in their functional status and demonstrates the ability to make significant improvements in function in a reasonable and predictable amount of time.  Equipment Recommendations  None recommended by OT    Recommendations for Other Services       Precautions / Restrictions Precautions Precautions: Fall Restrictions Weight Bearing Restrictions: Yes LLE Weight Bearing: Weight bearing as tolerated      Mobility Bed Mobility Overal bed mobility: Needs Assistance Bed Mobility: Supine to Sit     Supine to sit: Min assist, HOB elevated, Mod assist     General bed mobility comments: [place] dmovement. Assist to move L LE and hand held assist to pull to sitting.    Transfers Overall transfer level: Needs assistance Equipment used: Rolling walker (2 wheels) Transfers: Sit to/from Stand, Bed to chair/wheelchair/BSC Sit to Stand: Mod assist     Step pivot transfers: Mod assist     General transfer comment: Slow labored movement with RW      Balance Overall balance assessment: Needs assistance Sitting-balance support: Feet supported, Bilateral upper extremity supported Sitting balance-Leahy Scale: Good Sitting balance - Comments: seated EOB   Standing balance support: Bilateral upper extremity supported, During functional activity, Reliant on assistive device for balance Standing balance-Leahy Scale: Poor Standing balance comment: using RW  ADL either performed or assessed with clinical judgement   ADL Overall ADL's : Needs assistance/impaired     Grooming: Set up;Sitting   Upper Body Bathing: Set up;Sitting   Lower  Body Bathing: Moderate assistance;Maximal assistance;Sitting/lateral leans   Upper Body Dressing : Sitting;Set up   Lower Body Dressing: Moderate assistance;Maximal assistance;Sitting/lateral leans   Toilet Transfer: Moderate assistance;Stand-pivot;Rolling walker (2 wheels)   Toileting- Clothing Manipulation and Hygiene: Maximal assistance;Sitting/lateral lean;Sit to/from stand               Vision Baseline Vision/History: 1 Wears glasses Ability to See in Adequate Light: 0 Adequate Patient Visual Report: No change from baseline Vision Assessment?: No apparent visual deficits                Pertinent Vitals/Pain Pain Assessment Pain Assessment: No/denies pain     Hand Dominance Right   Extremity/Trunk Assessment Upper Extremity Assessment Upper Extremity Assessment: Generalized weakness   Lower Extremity Assessment Lower Extremity Assessment: Defer to PT evaluation   Cervical / Trunk Assessment Cervical / Trunk Assessment: Kyphotic   Communication Communication Communication: No difficulties   Cognition Arousal/Alertness: Awake/alert Behavior During Therapy: WFL for tasks assessed/performed Overall Cognitive Status: Within Functional Limits for tasks assessed                                                        Home Living Family/patient expects to be discharged to:: Private residence Living Arrangements: Spouse/significant other Available Help at Discharge: Family;Available 24 hours/day Type of Home: House Home Access: Ramped entrance     Home Layout: One level     Bathroom Shower/Tub: Occupational psychologist: Handicapped height Bathroom Accessibility: Yes How Accessible: Accessible via walker Home Equipment: BSC/3in1;Cane - single point;Transport chair;Shower Land (2 wheels)   Additional Comments: per PT note.      Prior Functioning/Environment Prior Level of Function : Needs assist        Physical Assist : ADLs (physical)   ADLs (physical): Dressing Mobility Comments: Uses a RW and wife assists as needed ADLs Comments: Assisted at SNF by wife and staff. Before SNF pt was independent with ADL's with wife assisting IADL's.        OT Problem List: Decreased strength;Decreased range of motion;Decreased activity tolerance;Impaired balance (sitting and/or standing);Decreased coordination;Decreased safety awareness      OT Treatment/Interventions: Self-care/ADL training;Therapeutic exercise;Therapeutic activities;Patient/family education;Balance training    OT Goals(Current goals can be found in the care plan section) Acute Rehab OT Goals Patient Stated Goal: open to return to rehab OT Goal Formulation: With patient Time For Goal Achievement: 07/07/22 Potential to Achieve Goals: Good  OT Frequency: Min 2X/week    Co-evaluation                               End of Session Equipment Utilized During Treatment: Rolling walker (2 wheels) Nurse Communication: Other (comment) (Notified pt in chair with supplemental O2 in place.)  Activity Tolerance: Patient tolerated treatment well Patient left: in chair;with call bell/phone within reach;with family/visitor present  OT Visit Diagnosis: Unsteadiness on feet (R26.81);Other abnormalities of gait and mobility (R26.89);Muscle weakness (generalized) (M62.81)                Time: 2025-4270 OT Time Calculation (  min): 21 min Charges:  OT General Charges $OT Visit: 1 Visit OT Evaluation $OT Eval Low Complexity: 1 Low  Mariajose Mow OT, MOT  Larey Seat 06/23/2022, 9:31 AM

## 2022-06-23 NOTE — Progress Notes (Signed)
Informed doctor about low BP. She asked me to take it manually. It was 88/55. I informed the doctor via secure chat.

## 2022-06-23 NOTE — Progress Notes (Signed)
   06/23/22 1804  Assess: MEWS Score  Temp 98.2 F (36.8 C)  BP (!) 75/45  MAP (mmHg) (!) 55  Pulse Rate 64  Resp (!) 23  Level of Consciousness Alert  SpO2 96 %  O2 Device Room Air  Assess: MEWS Score  MEWS Temp 0  MEWS Systolic 2  MEWS Pulse 0  MEWS RR 1  MEWS LOC 0  MEWS Score 3  MEWS Score Color Yellow  Assess: if the MEWS score is Yellow or Red  Were vital signs taken at a resting state? No  Focused Assessment No change from prior assessment  Does the patient meet 2 or more of the SIRS criteria? No  MEWS guidelines implemented *See Row Information* No, previously yellow, continue vital signs every 4 hours  Treat  Pain Scale 0-10  Pain Score 0  Notify: Provider  Provider Name/Title Dr. Jacinta Shoe  Date Provider Notified 06/23/22  Method of Notification  (secure chat)  Assess: SIRS CRITERIA  SIRS Temperature  0  SIRS Pulse 0  SIRS Respirations  1  SIRS WBC 1  SIRS Score Sum  2

## 2022-06-24 ENCOUNTER — Telehealth: Payer: Self-pay | Admitting: Internal Medicine

## 2022-06-24 ENCOUNTER — Inpatient Hospital Stay (HOSPITAL_COMMUNITY): Payer: Medicare Other

## 2022-06-24 DIAGNOSIS — R6521 Severe sepsis with septic shock: Secondary | ICD-10-CM

## 2022-06-24 DIAGNOSIS — A419 Sepsis, unspecified organism: Secondary | ICD-10-CM

## 2022-06-24 DIAGNOSIS — N179 Acute kidney failure, unspecified: Secondary | ICD-10-CM | POA: Diagnosis not present

## 2022-06-24 DIAGNOSIS — N390 Urinary tract infection, site not specified: Secondary | ICD-10-CM

## 2022-06-24 DIAGNOSIS — C8258 Diffuse follicle center lymphoma, lymph nodes of multiple sites: Secondary | ICD-10-CM

## 2022-06-24 DIAGNOSIS — E861 Hypovolemia: Secondary | ICD-10-CM | POA: Diagnosis not present

## 2022-06-24 DIAGNOSIS — I482 Chronic atrial fibrillation, unspecified: Secondary | ICD-10-CM | POA: Diagnosis not present

## 2022-06-24 DIAGNOSIS — B9620 Unspecified Escherichia coli [E. coli] as the cause of diseases classified elsewhere: Secondary | ICD-10-CM

## 2022-06-24 DIAGNOSIS — I9589 Other hypotension: Secondary | ICD-10-CM | POA: Diagnosis not present

## 2022-06-24 LAB — TRANSFUSION REACTION
DAT C3: NEGATIVE
Post RXN DAT IgG: NEGATIVE

## 2022-06-24 LAB — CBC
HCT: 22.7 % — ABNORMAL LOW (ref 39.0–52.0)
Hemoglobin: 7.4 g/dL — ABNORMAL LOW (ref 13.0–17.0)
MCH: 31.2 pg (ref 26.0–34.0)
MCHC: 32.6 g/dL (ref 30.0–36.0)
MCV: 95.8 fL (ref 80.0–100.0)
Platelets: 24 10*3/uL — CL (ref 150–400)
RBC: 2.37 MIL/uL — ABNORMAL LOW (ref 4.22–5.81)
RDW: 15.9 % — ABNORMAL HIGH (ref 11.5–15.5)
WBC: 11.6 10*3/uL — ABNORMAL HIGH (ref 4.0–10.5)
nRBC: 0 % (ref 0.0–0.2)

## 2022-06-24 LAB — COMPREHENSIVE METABOLIC PANEL
ALT: 38 U/L (ref 0–44)
AST: 24 U/L (ref 15–41)
Albumin: 1.6 g/dL — ABNORMAL LOW (ref 3.5–5.0)
Alkaline Phosphatase: 75 U/L (ref 38–126)
Anion gap: 11 (ref 5–15)
BUN: 63 mg/dL — ABNORMAL HIGH (ref 8–23)
CO2: 16 mmol/L — ABNORMAL LOW (ref 22–32)
Calcium: 7.2 mg/dL — ABNORMAL LOW (ref 8.9–10.3)
Chloride: 111 mmol/L (ref 98–111)
Creatinine, Ser: 3.2 mg/dL — ABNORMAL HIGH (ref 0.61–1.24)
GFR, Estimated: 19 mL/min — ABNORMAL LOW (ref >60)
Glucose, Bld: 126 mg/dL — ABNORMAL HIGH (ref 70–99)
Potassium: 4.2 mmol/L (ref 3.5–5.1)
Sodium: 138 mmol/L (ref 135–145)
Total Bilirubin: 2.1 mg/dL — ABNORMAL HIGH (ref 0.3–1.2)
Total Protein: 4.2 g/dL — ABNORMAL LOW (ref 6.5–8.1)

## 2022-06-24 LAB — URINE CULTURE: Culture: >=100000 — AB

## 2022-06-24 LAB — MRSA NEXT GEN BY PCR, NASAL: MRSA by PCR Next Gen: NOT DETECTED

## 2022-06-24 LAB — GLUCOSE, CAPILLARY: Glucose-Capillary: 100 mg/dL — ABNORMAL HIGH (ref 70–99)

## 2022-06-24 MED ORDER — CHLORHEXIDINE GLUCONATE CLOTH 2 % EX PADS
6.0000 | MEDICATED_PAD | Freq: Every day | CUTANEOUS | Status: DC
  Administered 2022-06-24 – 2022-06-25 (×2): 6 via TOPICAL

## 2022-06-24 NOTE — Progress Notes (Signed)
Pt was transferred to ICU, Room 7 around 2345 this shift per new order d/t hypotension. Pt's wife notified of situation and moving of pt, via telephone.

## 2022-06-24 NOTE — Consult Note (Signed)
Reason for Consult: AKI/CKD stage III Referring Physician: Broadus John, MD  Darius Smith is an 76 y.o. male has a PMH significant for COPD, HTN, DM, combined systolic and diastolic CHF, atrial fibrillation, metastatic Non-Hodgkins's lymphoma, follicular (treated with rituximab-lenalidomide with excellent response), and CKD stage IIIb who was admitted to [facility] on 9/20-9/25/23 with left intertrochanteric fracture after a fall.  He underwent IM nail to left hip on 9/22 and was discharged to SNF on 06/14/22 for rehab.  He was readmitted on 06/16/22 with hypotension and syncope.  He was treated with IVF's with some improvement, however he developed  pancytopenia, rigors, and urine and blood cultures + for E. Coli.  His hypotension worsened and was transferred to the ICU on 06/23/22 and started on levophed.  His UOP decreased and his Scr started to rise.  The trend in Scr is seen below.  We were consulted to further evaluate and manage his AKI/CKD stage IIIb.  He denies any N/V/D, shortness of breath, chest pain, dysuria, pyuria, hematuria, urgency, frequency, or retention.  Trend in Creatinine: Creatinine, Ser  Date/Time Value Ref Range Status  06/24/2022 03:03 AM 3.20 (H) 0.61 - 1.24 mg/dL Final  06/23/2022 12:28 AM 2.20 (H) 0.61 - 1.24 mg/dL Final  06/22/2022 03:45 PM 1.91 (H) 0.61 - 1.24 mg/dL Final  06/22/2022 04:50 AM 1.48 (H) 0.61 - 1.24 mg/dL Final  06/21/2022 07:04 AM 1.61 (H) 0.61 - 1.24 mg/dL Final  06/20/2022 06:11 AM 1.71 (H) 0.61 - 1.24 mg/dL Final  06/19/2022 05:14 AM 1.70 (H) 0.61 - 1.24 mg/dL Final  06/18/2022 06:33 AM 1.92 (H) 0.61 - 1.24 mg/dL Final  06/17/2022 04:30 AM 2.49 (H) 0.61 - 1.24 mg/dL Final  06/16/2022 01:23 PM 3.16 (H) 0.61 - 1.24 mg/dL Final  06/14/2022 06:47 AM 1.72 (H) 0.61 - 1.24 mg/dL Final  06/13/2022 05:34 AM 1.58 (H) 0.61 - 1.24 mg/dL Final  06/12/2022 06:24 AM 1.76 (H) 0.61 - 1.24 mg/dL Final  06/11/2022 03:47 AM 1.64 (H) 0.61 - 1.24 mg/dL Final  06/10/2022 09:13 AM  1.89 (H) 0.61 - 1.24 mg/dL Final  06/09/2022 06:55 PM 1.79 (H) 0.61 - 1.24 mg/dL Final  09/27/2021 12:57 AM 1.44 (H) 0.61 - 1.24 mg/dL Final  08/10/2021 08:33 AM 1.65 (H) 0.76 - 1.27 mg/dL Final  06/10/2021 01:27 PM 1.43 (H) 0.61 - 1.24 mg/dL Final  06/05/2021 05:38 AM 1.50 (H) 0.61 - 1.24 mg/dL Final  06/04/2021 05:10 AM 1.59 (H) 0.61 - 1.24 mg/dL Final  06/03/2021 03:25 PM 1.70 (H) 0.61 - 1.24 mg/dL Final  06/03/2021 10:26 AM 1.70 (H) 0.61 - 1.24 mg/dL Final  06/02/2021 04:42 AM 1.54 (H) 0.61 - 1.24 mg/dL Final  06/01/2021 07:14 PM 2.15 (H) 0.61 - 1.24 mg/dL Final  06/01/2021 03:03 PM 2.11 (H) 0.61 - 1.24 mg/dL Final  03/03/2021 09:36 AM 1.86 (H) 0.61 - 1.24 mg/dL Final  02/03/2021 08:52 AM 1.95 (H) 0.61 - 1.24 mg/dL Final  01/28/2021 01:31 PM 1.84 (H) 0.61 - 1.24 mg/dL Final  01/08/2021 08:57 AM 1.82 (H) 0.61 - 1.24 mg/dL Final  12/19/2020 08:58 AM 1.91 (H) 0.61 - 1.24 mg/dL Final  12/04/2020 09:33 AM 1.88 (H) 0.61 - 1.24 mg/dL Final  11/12/2020 12:12 PM 1.51 (H) 0.61 - 1.24 mg/dL Final  11/02/2020 09:19 PM 1.37 (H) 0.61 - 1.24 mg/dL Final  09/23/2020 02:29 PM 1.40 (H) 0.61 - 1.24 mg/dL Final  09/10/2020 08:07 AM 1.30 (H) 0.61 - 1.24 mg/dL Final  08/25/2020 02:21 PM 1.30 (H) 0.61 - 1.24 mg/dL Final  08/19/2020 04:26 PM 1.81 (H) 0.61 - 1.24 mg/dL Final  08/12/2020 12:20 PM 1.59 (H) 0.61 - 1.24 mg/dL Final  08/05/2020 08:18 AM 1.57 (H) 0.61 - 1.24 mg/dL Final  08/04/2020 06:21 AM 1.91 (H) 0.61 - 1.24 mg/dL Final  08/03/2020 06:48 AM 2.04 (H) 0.61 - 1.24 mg/dL Final  08/02/2020 06:29 AM 1.82 (H) 0.61 - 1.24 mg/dL Final  08/01/2020 04:08 AM 2.28 (H) 0.61 - 1.24 mg/dL Final  07/31/2020 10:15 PM 2.25 (H) 0.61 - 1.24 mg/dL Final  07/30/2020 10:04 AM 1.61 (H) 0.61 - 1.24 mg/dL Final  07/18/2020 05:40 AM 1.70 (H) 0.61 - 1.24 mg/dL Final  07/17/2020 04:40 AM 1.76 (H) 0.61 - 1.24 mg/dL Final  07/16/2020 04:41 AM 1.91 (H) 0.61 - 1.24 mg/dL Final  07/15/2020 03:43 AM 2.06 (H) 0.61 - 1.24  mg/dL Final  07/14/2020 10:31 AM 1.64 (H) 0.61 - 1.24 mg/dL Final  07/06/2020 10:13 AM 1.66 (H) 0.61 - 1.24 mg/dL Final    PMH:   Past Medical History:  Diagnosis Date   Allergic rhinitis 07/02/2019   Allergy 2015   Arthritis    both hands   Atrial fibrillation ([place]) 10/28/2017   Dr. Harl Bowie   Atrial fibrillation with RVR ([place]) 07/14/2020   Blood in urine    occ   Bronchitis    Cancer ([place])    Basal cell   Cardiomegaly 10/28/2017   CHF (congestive heart failure) (HCC)    Clotting disorder ([place])    Complication of anesthesia    Mr. Sharpley developed a. fib in recovery after cysto procedure 10/2017 was transferred to [facility] and underwent successful cardioversion   COPD (chronic obstructive pulmonary disease) ([place]) 06/10/2022   Diabetes ([place])    Diverticulosis of sigmoid colon 08/2017   Noted on colonoscopy   Dysrhythmia    External hemorrhoids 08/2017   GERD (gastroesophageal reflux disease)    History of colon polyps 08/2017   History of kidney stones    History of rectal bleeding    History of right inguinal hernia    Hypertension    Lung cancer ([place]) 06/25/2020   Lymphoma ([place])    Nephrolithiasis 07/02/2019   Normocytic anemia 06/10/2022   Rectal bleeding 09/02/2017   Added automatically from request for surgery [id number]   Shortness of breath    Stage 3 chronic kidney disease ([place])    Type 2 diabetes mellitus ([place]) 06/10/2022    PSH:   Past Surgical History:  Procedure Laterality Date   AXILLARY LYMPH NODE BIOPSY Left 06/05/2021   Procedure: AXILLARY LYMPH NODE BIOPSY;  Surgeon: Virl Cagey, MD;  Location: [facility] ORS;  Service: General;  Laterality: Left;   CARDIOVERSION     10/2017   CARDIOVERSION N/A 07/16/2020   Procedure: CARDIOVERSION;  Surgeon: Arnoldo Lenis, MD;  Location: [facility] ENDO SUITE;  Service: Endoscopy;  Laterality: N/A;   CARDIOVERSION N/A 08/21/2020   Procedure: CARDIOVERSION;  Surgeon: Satira Sark, MD;  Location: [facility] ORS;  Service:  Cardiovascular;  Laterality: N/A;   CARDIOVERSION N/A 09/10/2020   Procedure: CARDIOVERSION;  Surgeon: Josue Hector, MD;  Location: [facility] ENDOSCOPY;  Service: Cardiovascular;  Laterality: N/A;   CHOLECYSTECTOMY     CLEFT LIP REPAIR     several from childhood til 76 years old   [place]     several from childhood til 77 years old   COLONOSCOPY  04/28/2011   Procedure: COLONOSCOPY;  Surgeon: Rogene Houston, MD;  Location: [facility] SU[facility];  Service: Endoscopy;  Laterality: N/A;   COLONOSCOPY N/A 07/18/2014   Procedure: COLONOSCOPY;  Surgeon: Rogene Houston, MD;  Location: [facility];  Service: Endoscopy;  Laterality: N/A;  830   COLONOSCOPY N/A 09/09/2017   Procedure: COLONOSCOPY;  Surgeon: Rogene Houston, MD;  Location: [facility];  Service: Endoscopy;  Laterality: N/A;  955   COSMETIC SURGERY  1940's &1950's   CYSTOSCOPY/URETEROSCOPY/HOLMIUM LASER/STENT PLACEMENT Bilateral 12/06/2017   Procedure: CYSTOSCOPY/RETROGRADE/URETEROSCOPY/HOLMIUM LASER/STENT EXCHANGE;  Surgeon: Festus Aloe, MD;  Location: [facility] ORS;  Service: Urology;  Laterality: Bilateral;  ONLY NEEDS 60 MIN   HERNIA REPAIR     INTRAMEDULLARY (IM) NAIL INTERTROCHANTERIC Left 06/11/2022   Procedure: INTRAMEDULLARY (IM) NAIL INTERTROCHANTERIC;  Surgeon: Mordecai Rasmussen, MD;  Location: [facility] OR[facility];  Service: Orthopedics;  Laterality: Left;   LYMPH NODE BIOPSY Left    Under left arm   POLYPECTOMY  09/09/2017   Procedure: POLYPECTOMY;  Surgeon: Rogene Houston, MD;  Location: [facility];  Service: Endoscopy;;  colon   vocal cord surgery      Allergies:  Allergies  Allergen Reactions   Flomax [Tamsulosin Hcl] Nausea Only    Dizziness  Pt is on flomax   Heparin Other (See Comments)    Tingling in face and shortness of breath    Medications:   Prior to Admission medications   Medication Sig Start Date End Date Taking? Authorizing Provider  acetaminophen (TYLENOL) 500 MG tablet Take 1,000 mg by mouth every 6  (six) hours as needed for moderate pain or headache.   Yes [provider]  amiodarone (PACERONE) 200 MG tablet TAKE 1 TABLET BY MOUTH ONCE A DAY. 03/05/22  Yes Fenton, Clint R, PA  amLODipine (NORVASC) 10 MG tablet TAKE 1 TABLET BY MOUTH ONCE DAILY. Patient taking differently: Take 10 mg by mouth daily. Hold for systolic BP under 222. 9/79/89  Yes Branch, Alphonse Guild, MD  apixaban (ELIQUIS) 5 MG TABS tablet TAKE ONE TABLET BY MOUTH 2 TIMES A DAY. 12/21/21  Yes Branch, Alphonse Guild, MD  ferrous sulfate 325 (65 FE) MG tablet Take 325 mg by mouth daily with breakfast.   Yes [provider]  furosemide (LASIX) 20 MG tablet Take 1 tablet (20 mg total) by mouth every other day. (May take an additional tab as needed.) 01/21/22  Yes Branch, Alphonse Guild, MD  hydrALAZINE (APRESOLINE) 100 MG tablet Take 1 tablet (100 mg total) by mouth 2 (two) times daily. 05/31/22  Yes BranchAlphonse Guild, MD  isosorbide mononitrate (IMDUR) 30 MG 24 hr tablet TAKE 1/2 TABLET BY MOUTH ONCE DAILY. 11/11/21  Yes BranchAlphonse Guild, MD  lenalidomide (REVLIMID) 10 MG capsule Take 10 mg by mouth. Take 10 mg on days 1-21 of a 28 day cycle. 07/13/21  Yes [provider]  omeprazole (PRILOSEC) 20 MG capsule Take 1 capsule (20 mg total) by mouth daily as needed (reflux). 06/15/21  Yes [name], MD  oxyCODONE (ROXICODONE) 5 MG immediate release tablet Take 1 tablet (5 mg total) by mouth every 8 (eight) hours as needed for breakthrough pain. 06/14/22 06/14/23 Yes Shah, Pratik D, DO  potassium chloride SA (KLOR-CON M) 20 MEQ tablet TAKE ONE TABLET BY MOUTH ONCE DAILY. 05/07/22  Yes Branch, Alphonse Guild, MD  VENTOLIN HFA 108 [phone] Base) MCG/ACT inhaler Inhale 2 puffs into the lungs every 6 (six) hours as needed for wheezing or shortness of breath. 07/11/20  Yes Fayrene Helper, MD  blood glucose meter kit and supplies KIT Inject  1 each into the skin 4 (four) times daily -  before meals and at bedtime. Dispense based on  patient and insurance preference. Use up to four times daily as directed. 06/02/21   Barb Merino, MD  Continuous Blood Gluc Receiver (FREESTYLE LIBRE 2 READER) DEVI Use to monitor blood glucose continuously 06/09/21   [provider]  Continuous Blood Gluc Sensor (FREESTYLE LIBRE 2 SENSOR) MISC USE TO MONITOR BLOOD SUGAR DAILY. (CHANGE EVERY 2WEEKS) 12/14/21   [name], MD  glucose blood (ONETOUCH ULTRA) test strip 1 each by Other route 4 (four) times daily. 10/27/21   [name], MD  insulin lispro (HUMALOG KWIKPEN) 100 UNIT/ML KwikPen Inject 1-10 Units into the skin 4 (four) times daily -  before meals and at bedtime. Blood sugars 70-120: 1 unit 121-150: 2 units 151-200: 3 units 201-250: 4 units 251-300: 5 units 301-350: 6 units 351-400: 9 units More than 400, 10 units and call your doctor Patient not taking: Reported on 06/09/2022 06/02/21   Barb Merino, MD  Insulin Pen Needle 29G X 12MM MISC 1 each by Does not apply route 4 (four) times daily -  before meals and at bedtime. 07/09/21   Fayrene Helper, MD  Lancets (ONETOUCH DELICA PLUS [id number]) [place] 1 each by Other route 4 (four) times daily. 06/02/21   [provider]    Inpatient medications:  amiodarone  200 mg Oral Daily   Chlorhexidine Gluconate Cloth  6 each Topical Daily   ferrous sulfate  325 mg Oral Q breakfast   polyethylene glycol  17 g Oral Daily   sodium bicarbonate  650 mg Oral BID    Discontinued Meds:   Medications Discontinued During This Encounter  Medication Reason   cetirizine (ZYRTEC) 10 MG tablet Patient Preference   0.9 %  sodium chloride infusion (Manually program via Guardrails IV Fluids)    albuterol (VENTOLIN HFA) 108 (90 Base) MCG/ACT inhaler 2 puff Inpatient Standard   metoprolol tartrate (LOPRESSOR) tablet 12.5 mg    polyethylene glycol (MIRALAX / GLYCOLAX) packet 17 g    0.9 %  sodium chloride infusion    methylPREDNISolone sodium succinate (SOLU-MEDROL) 125 mg/2 mL  injection 125 mg    furosemide (LASIX) tablet 20 mg    ceFEPIme (MAXIPIME) 2 g in sodium chloride 0.9 % 100 mL IVPB     Social History:  reports that he has never smoked. He has never used smokeless tobacco. He reports that he does not currently use alcohol. He reports that he does not use drugs.  Family History:   Family History  Problem Relation Age of Onset   Breast cancer Mother    Cancer Mother    Prostate cancer Father    Cancer Father    COPD Sister    Birth defects Son     Pertinent items are noted in HPI. Weight change:   Intake/Output Summary (Last 24 hours) at 06/24/2022 1134 Last data filed at 06/24/2022 1014 Gross per 24 hour  Intake 3272.87 ml  Output 375 ml  Net 2897.87 ml   BP (!) 114/50   Pulse (!) 55   Temp 98 F (36.7 C) (Oral)   Resp 17   Ht 6' (1.829 m)   Wt 80.3 kg   SpO2 96%   BMI 24.01 kg/m  Vitals:   06/24/22 0800 06/24/22 0815 06/24/22 0900 06/24/22 1000  BP: (!) 115/54 118/62 (!) 112/49 (!) 114/50  Pulse: (!) 59 62 (!) 59 (!) 55  Resp:  20 19 (!) 22 17  Temp:      TempSrc:      SpO2: 99% 99% 98% 96%  Weight:      Height:         General appearance: cooperative, fatigued, and no distress Head: Normocephalic, without obvious abnormality, atraumatic Resp: clear to auscultation bilaterally Cardio: regular rate and rhythm, S1, S2 normal, no murmur, click, rub or gallop GI: soft, non-tender; bowel sounds normal; no masses,  no organomegaly Extremities: edema 1+ pitting edema of BLE, 2 + presacral edema  Labs: Basic Metabolic Panel: Recent Labs  Lab 06/19/22 0514 06/20/22 0611 06/21/22 0704 06/22/22 0450 06/22/22 1545 06/23/22 0028 06/24/22 0303  NA 137 134* 135 139 136 139 138  K 3.8 3.7 3.7 3.8 4.2 4.4 4.2  CL 111 110 112* 113* 113* 112* 111  CO2 21* 18* 18* 19* 15* 18* 16*  GLUCOSE 106* 107* 111* 101* 112* 166* 126*  BUN 38* 43* 44* 39* 42* 44* 63*  CREATININE 1.70* 1.71* 1.61* 1.48* 1.91* 2.20* 3.20*  ALBUMIN  --   --   --    --  1.9* 1.7* 1.6*  CALCIUM 7.7* 7.1* 7.3* 7.5* 7.5* 7.2* 7.2*   Liver Function Tests: Recent Labs  Lab 06/22/22 1545 06/23/22 0028 06/24/22 0303  AST 47* 44* 24  ALT 57* 53* 38  ALKPHOS 88 87 75  BILITOT 3.2* 3.9* 2.1*  PROT 4.6* 4.1* 4.2*  ALBUMIN 1.9* 1.7* 1.6*   No results for input(s): "LIPASE", "AMYLASE" in the last 168 hours. No results for input(s): "AMMONIA" in the last 168 hours. CBC: Recent Labs  Lab 06/22/22 1545 06/22/22 1857 06/23/22 0028 06/24/22 0303  WBC 0.6* 2.0* 5.3 11.6*  NEUTROABS  --  1.8  --   --   HGB 9.2* 8.4* 8.0* 7.4*  HCT 28.5* 25.1* 24.6* 22.7*  MCV 98.3 94.0 96.5 95.8  PLT 28* 35* 43* 24*   PT/INR: _0 (inr:5) Cardiac Enzymes: )No results for input(s): "CKTOTAL", "CKMB", "CKMBINDEX", "TROPONINI" in the last 168 hours. CBG: Recent Labs  Lab 06/21/22 0358 06/22/22 0456 06/23/22 0545 06/23/22 0718 06/24/22 0440  GLUCAP 112* 95 161* 152* 100*    Iron Studies: No results for input(s): "IRON", "TIBC", "TRANSFERRIN", "FERRITIN" in the last 168 hours.  Xrays/Other Studies: DG CHEST PORT 1 VIEW  Result Date: 06/23/2022 CLINICAL DATA:  Shortness of breath EXAM: PORTABLE CHEST 1 VIEW COMPARISON:  06/22/2022 FINDINGS: Moderate cardiomegaly is unchanged. Left axillary surgical clips. No focal airspace consolidation or pulmonary edema. IMPRESSION: Unchanged cardiomegaly. Electronically Signed   By: Ulyses Jarred M.D.   On: 06/23/2022 04:02   DG Chest Port 1 View  Result Date: 06/22/2022 CLINICAL DATA:  10026 Shortness of breath 10026 EXAM: PORTABLE CHEST 1 VIEW COMPARISON:  September 27, 2021 FINDINGS: The cardiomediastinal silhouette is unchanged in contour.LEFT axillary clips. No pleural effusion. No pneumothorax. Similar appearance of chronic interstitial and bronchial markings. No acute pleuroparenchymal abnormality. Visualized abdomen is unremarkable. IMPRESSION: No acute cardiopulmonary abnormality. Electronically Signed   By:  Valentino Saxon M.D.   On: 06/22/2022 14:38   DG Pelvis Portable  Result Date: 06/22/2022 CLINICAL DATA:  Intertrochanteric fracture left femur. EXAM: LEFT FEMUR PORTABLE 2 VIEWS; PORTABLE PELVIS 1-2 VIEWS COMPARISON:  Left femur radiographs 06/11/2022 FINDINGS: Redemonstration of long cephalomedullary nail fixation for intertrochanteric left femoral fracture. There is again near anatomic alignment. Mild medial displacement of lesser trochanter is unchanged. Resolution of the prior postoperative subcutaneous air at the lateral pelvis, lateral proximal thigh, and  lateral distal thigh. No perihardware lucency is seen to indicate hardware failure or loosening. Mild medial compartment of the knee a left wrist separate joint space narrowing. IMPRESSION: Status post left femoral intramedullary nail fixation without evidence of hardware failure. Electronically Signed   By: Yvonne Kendall M.D.   On: 06/22/2022 13:05   DG FEMUR PORT MIN 2 VIEWS LEFT  Result Date: 06/22/2022 CLINICAL DATA:  Intertrochanteric fracture left femur. EXAM: LEFT FEMUR PORTABLE 2 VIEWS; PORTABLE PELVIS 1-2 VIEWS COMPARISON:  Left femur radiographs 06/11/2022 FINDINGS: Redemonstration of long cephalomedullary nail fixation for intertrochanteric left femoral fracture. There is again near anatomic alignment. Mild medial displacement of lesser trochanter is unchanged. Resolution of the prior postoperative subcutaneous air at the lateral pelvis, lateral proximal thigh, and lateral distal thigh. No perihardware lucency is seen to indicate hardware failure or loosening. Mild medial compartment of the knee a left wrist separate joint space narrowing. IMPRESSION: Status post left femoral intramedullary nail fixation without evidence of hardware failure. Electronically Signed   By: Yvonne Kendall M.D.   On: 06/22/2022 13:05     Assessment/Plan:  AKI/CKD stage IIIb - presumably ischemic ATN in setting of E. Coli urosepsis and need for pressors.   UOP has dropped.  Will check renal US and continue to follow bladder scans.  Agree with holding diuretics for now given hypotension.  Discussed possible need for dialysis, however no urgent indication at this time. Avoid nephrotoxic medications including NSAIDs and iodinated intravenous contrast exposure unless the latter is absolutely indicated.  Preferred narcotic agents for pain control are hydromorphone, fentanyl, and methadone. Morphine should not be used.  Avoid Baclofen and avoid oral sodium phosphate and magnesium citrate based laxatives / bowel preps.  Continue strict Input and Output monitoring. Will monitor the patient closely with you and intervene or adjust therapy as indicated by changes in clinical status/labs   E. Coli Urosepsis - currently on levophed and ceftriaxone. Acute blood loss anemia - s/p hip surgery and hematoma.  Received 2 units PRBC's this admission. Pancytopenia - presumably due to chemotherapy but now with elevated WBC due to sepsis. Chronic combined systolic and diastolic CHF - has lower extremity edema.  Holding IVF's and diuretics for now and follow.  Permanent atrial fibrillation - rate controlled on amiodarone.  Was on Eliquis but held for surgery. DM - per primary S/p left femur fracture s/p IM nail.   Governor Rooks Robertine Kipper 06/24/2022, 11:34 AM

## 2022-06-24 NOTE — Plan of Care (Signed)
  Problem: Education: Goal: Knowledge of General Education information will improve Description: Including pain rating scale, medication(s)/side effects and non-pharmacologic comfort measures Outcome: Progressing   Problem: Pain Managment: Goal: General experience of comfort will improve Outcome: Progressing   

## 2022-06-24 NOTE — Progress Notes (Signed)
OT Cancellation Note  Patient Details Name: Darius Smith MRN: [id number] DOB: 06-20-1946   Cancelled Treatment:    Reason Eval/Treat Not Completed: Medical issues which prohibited therapy. Pt transferred to higher level of care and will need new OT consult to resume therapy when pt is medically stable. Thank you.   Sameka Bagent OT, MOT   Larey Seat 06/24/2022, 7:59 AM

## 2022-06-24 NOTE — Consult Note (Signed)
Virtual Visit via Video Note  I connected with [name] on @TODAY @ at  by a video enabled telemedicine application and verified that I am speaking with the correct person using two identifiers.  Location: Patient: Darius Smith ICU Bed 7 Provider: Elvina Sidle   I discussed the limitations of evaluation and management by telemedicine and the availability of in person appointments. The patient expressed understanding and agreed to proceed. \   Date of Admission:  06/16/2022          Reason for Consult: E coli bacteremia in patient present    Referring Provider: [facility] auto-consult and Domenic Polite, MD  Assessment:  E coli bacteremia due to complicated UTI with sepsis Syncope Pancytopenia NHL CKD COPD Recent left intertrochanteric fracture after a fall with IM to left hip Pancytopenia Acute on chronic renal failure Atrial fibrillation  Plan:  Continue cefriaxone IVF, pressors, supportive care F/u blood cultures and sensis   Principal Problem:   Hypotension Active Problems:   Essential hypertension   Atrial fibrillation (HCC)   Non Hodgkin's lymphoma (HCC)   Chronic combined systolic and diastolic CHF (congestive heart failure) (HCC)   Stage 3b chronic kidney disease (HCC)   DNR (do not resuscitate) discussion   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[place])   Closed comminuted intertrochanteric fracture of left femur (HCC)   Thrombocytopenia ([place])   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 ([place])   Syncope   AKI (acute kidney injury) ([place])   Acute anemia   Scheduled Meds:  amiodarone  200 mg Oral Daily   Chlorhexidine Gluconate Cloth  6 each Topical Daily   ferrous sulfate  325 mg Oral Q breakfast   polyethylene glycol  17 g Oral Daily   sodium bicarbonate  650 mg Oral BID   Continuous Infusions:  sodium chloride Stopped (06/24/22 0008)   cefTRIAXone (ROCEPHIN)  IV Stopped (06/23/22 1742)   norepinephrine (LEVOPHED) Adult infusion 2 mcg/min  (06/24/22 1430)   PRN Meds:.acetaminophen **OR** acetaminophen, albuterol, ondansetron **OR** ondansetron (ZOFRAN) IV, oxyCODONE  HPI: Darius Smith is a 76 y.o. male with multiple medical problems including combined systolic diastolic heart failure atrial fibrillation  non Hodgkin's lymphoma with brain metastases s/p first chemotherapy including rituximab who had been recent hospital [facility] after he had fallen and sustained a left femur fracture and was status post ORIF.  He was readmitted shortly after discharge with hypotension and syncope.  He was given fluid resuscitation and improved.  Blood pressure stabilized he did have acute kidney injury superimposed on his chronic kidney disease.  He also had pancytopenia.  He then became slightly febrile on 3 October and blood cultures were taken which have now grown E. coli from 2 out of 2 sites as well as from his urine.  He was initially on cefepime but now changed over to ceftriaxone.  Repeat blood cultures are without growth.  The patient unfortunately is hypotensive and on pressors.  I was consulted via the  auto consult mechanism for hospital onset bacteremias.  I actually do not think this is a hospital onset bacteremia but likely 1 that was brewing when he had his syncopal episode and is just been discovered when blood cultures were taken. He does not have an indwelling catheter.  I will follow-up tomorrow via video feed  I spent 82 minutes with the patient including than 50% of the time in face to face counseling of the patient  and his wife  re his E coli bacteremia from urinary source with septic shock  personally reviewing radiographs along with review of medical records in preparation for the visit and during the visit and in coordination of nis care.   Review of Systems: Review of Systems  Unable to perform ROS: Critical illness    Past Medical History:  Diagnosis Date   Allergic rhinitis 07/02/2019   Allergy 2015    Arthritis    both hands   Atrial fibrillation ([place]) 10/28/2017   Dr. Harl Bowie   Atrial fibrillation with RVR ([place]) 07/14/2020   Blood in urine    occ   Bronchitis    Cancer ([place])    Basal cell   Cardiomegaly 10/28/2017   CHF (congestive heart failure) (HCC)    Clotting disorder ([place])    Complication of anesthesia    Mr. Corbo developed a. fib in recovery after cysto procedure 10/2017 was transferred to [facility] and underwent successful cardioversion   COPD (chronic obstructive pulmonary disease) ([place]) 06/10/2022   Diabetes ([place])    Diverticulosis of sigmoid colon 08/2017   Noted on colonoscopy   Dysrhythmia    External hemorrhoids 08/2017   GERD (gastroesophageal reflux disease)    History of colon polyps 08/2017   History of kidney stones    History of rectal bleeding    History of right inguinal hernia    Hypertension    Lung cancer ([place]) 06/25/2020   Lymphoma ([place])    Nephrolithiasis 07/02/2019   Normocytic anemia 06/10/2022   Rectal bleeding 09/02/2017   Added automatically from request for surgery [phone]   Shortness of breath    Stage 3 chronic kidney disease ([place])    Type 2 diabetes mellitus ([place]) 06/10/2022    Social History   Tobacco Use   Smoking status: Never   Smokeless tobacco: Never   Tobacco comments:    Never smoke 11/3021  Vaping Use   Vaping Use: Never used  Substance Use Topics   Alcohol use: Not Currently   Drug use: No    Family History  Problem Relation Age of Onset   Breast cancer Mother    Cancer Mother    Prostate cancer Father    Cancer Father    COPD Sister    Birth defects Son    Allergies  Allergen Reactions   Flomax [Tamsulosin Hcl] Nausea Only    Dizziness  Pt is on flomax   Heparin Other (See Comments)    Tingling in face and shortness of breath    OBJECTIVE: Blood pressure 109/80, pulse 70, temperature 98 F (36.7 C), temperature source Oral, resp. rate 13, height 6' (1.829 m), weight 80.3 kg, SpO2 100 %.  Physical  Exam Vitals and nursing note reviewed.  HENT:     Head: Normocephalic and atraumatic.     Nose: Nose normal.  Cardiovascular:     Rate and Rhythm: Tachycardia present. Rhythm irregular.  Pulmonary:     Effort: No respiratory distress.     Breath sounds: No wheezing.  Abdominal:     General: There is no distension.  Skin:    Coloration: Skin is pale.  Neurological:     General: No focal deficit present.     Mental Status: He is alert and oriented to person, place, and time.  Psychiatric:        Attention and Perception: He is inattentive.     Comments: Wife at bedside answered most of the questions and did most of the talking  Lab Results Lab Results  Component Value Date   WBC 11.6 (H) 06/24/2022   HGB 7.4 (L) 06/24/2022   HCT 22.7 (L) 06/24/2022   MCV 95.8 06/24/2022   PLT 24 (LL) 06/24/2022    Lab Results  Component Value Date   CREATININE 3.20 (H) 06/24/2022   BUN 63 (H) 06/24/2022   NA 138 06/24/2022   K 4.2 06/24/2022   CL 111 06/24/2022   CO2 16 (L) 06/24/2022    Lab Results  Component Value Date   ALT 38 06/24/2022   AST 24 06/24/2022   ALKPHOS 75 06/24/2022   BILITOT 2.1 (H) 06/24/2022     Microbiology: Recent Results (from the past 240 hour(s))  SARS Coronavirus 2 by RT PCR (hospital order, performed in [facility] hospital lab) *cepheid single result test* Anterior Nasal Swab     Status: None   Collection Time: 06/18/22  2:03 PM   Specimen: Anterior Nasal Swab  Result Value Ref Range Status   SARS Coronavirus 2 by RT PCR NEGATIVE NEGATIVE Final    Comment: (NOTE) SARS-CoV-2 target nucleic acids are NOT DETECTED.  The SARS-CoV-2 RNA is generally detectable in upper and lower respiratory specimens during the acute phase of infection. The lowest concentration of SARS-CoV-2 viral copies this assay can detect is 250 copies / mL. A negative result does not preclude SARS-CoV-2 infection and should not be used as the sole basis for treatment or  other patient management decisions.  A negative result may occur with improper specimen collection / handling, submission of specimen other than nasopharyngeal swab, presence of viral mutation(s) within the areas targeted by this assay, and inadequate number of viral copies (<250 copies / mL). A negative result must be combined with clinical observations, patient history, and epidemiological information.  Fact Sheet for Patients:   https://www.patel.info/  Fact Sheet for Healthcare Providers: https://hall.com/  This test is not yet approved or  cleared by the [place] FDA and has been authorized for detection and/or diagnosis of SARS-CoV-2 by FDA under an Emergency Use Authorization (EUA).  This EUA will remain in effect (meaning this test can be used) for the duration of the COVID-19 declaration under Section 564(b)(1) of the Act, 21 U.S.C. section 360bbb-3(b)(1), unless the authorization is terminated or revoked sooner.  Performed at [facility], [address], [place], [place] [address]   SARS Coronavirus 2 by RT PCR (hospital order, performed in [facility] hospital lab) *cepheid single result test* Anterior Nasal Swab     Status: None   Collection Time: 06/21/22  9:07 AM   Specimen: Anterior Nasal Swab  Result Value Ref Range Status   SARS Coronavirus 2 by RT PCR NEGATIVE NEGATIVE Final    Comment: (NOTE) SARS-CoV-2 target nucleic acids are NOT DETECTED.  The SARS-CoV-2 RNA is generally detectable in upper and lower respiratory specimens during the acute phase of infection. The lowest concentration of SARS-CoV-2 viral copies this assay can detect is 250 copies / mL. A negative result does not preclude SARS-CoV-2 infection and should not be used as the sole basis for treatment or other patient management decisions.  A negative result may occur with improper specimen collection / handling, submission of specimen other than  nasopharyngeal swab, presence of viral mutation(s) within the areas targeted by this assay, and inadequate number of viral copies (<250 copies / mL). A negative result must be combined with clinical observations, patient history, and epidemiological information.  Fact Sheet for Patients:   https://www.patel.info/  Fact  Sheet for Healthcare Providers: https://hall.com/  This test is not yet approved or  cleared by the [place] and has been authorized for detection and/or diagnosis of SARS-CoV-2 by FDA under an Emergency Use Authorization (EUA).  This EUA will remain in effect (meaning this test can be used) for the duration of the COVID-19 declaration under Section 564(b)(1) of the Act, 21 U.S.C. section 360bbb-3(b)(1), unless the authorization is terminated or revoked sooner.  Performed at [facility], [address] [place], [place] [address]   Urine Culture     Status: Abnormal   Collection Time: 06/21/22  8:31 PM   Specimen: Urine, Clean Catch  Result Value Ref Range Status   Specimen Description   Final    URINE, CLEAN CATCH Performed at [facility], [address] [place], [place] [address]    Special Requests   Final    NONE Performed at [facility], [address] [place], [place] [address]    Culture >=100,000 COLONIES/mL ESCHERICHIA COLI (A)  Final   Report Status 06/24/2022 FINAL  Final   Organism ID, Bacteria ESCHERICHIA COLI (A)  Final      Susceptibility   Escherichia coli - MIC*    AMPICILLIN >=32 RESISTANT Resistant     CEFAZOLIN <=4 SENSITIVE Sensitive     CEFEPIME <=0.12 SENSITIVE Sensitive     CEFTRIAXONE <=0.25 SENSITIVE Sensitive     CIPROFLOXACIN <=0.25 SENSITIVE Sensitive     GENTAMICIN <=1 SENSITIVE Sensitive     IMIPENEM <=0.25 SENSITIVE Sensitive     NITROFURANTOIN <=16 SENSITIVE Sensitive     TRIMETH/SULFA <=20 SENSITIVE Sensitive     AMPICILLIN/SULBACTAM >=32 RESISTANT Resistant      PIP/TAZO <=4 SENSITIVE Sensitive     * >=100,000 COLONIES/mL ESCHERICHIA COLI  Culture, blood (Routine X 2) w Reflex to ID Panel     Status: Abnormal (Preliminary result)   Collection Time: 06/22/22  6:05 PM   Specimen: BLOOD RIGHT HAND  Result Value Ref Range Status   Specimen Description   Final    BLOOD RIGHT HAND Performed at [facility], [address] [place], [place] [address]    Special Requests   Final    BOTTLES DRAWN AEROBIC AND ANAEROBIC Blood Culture adequate volume Performed at [place] [place], [place] [address]    Culture  Setup Time   Final    GRAM NEGATIVE RODS Gram Stain Report Called to,Read Back By and Verified With: HEARP N. AT [id number] ON [id number] BY THOMPSON S. IN BOTH AEROBIC AND ANAEROBIC BOTTLES CRITICAL RESULT CALLED TO, READ BACK BY AND VERIFIED WITH: PHARMD TINA TALBOTT 06/23/22 0957 BY AB    Culture (A)  Final    ESCHERICHIA COLI SUSCEPTIBILITIES TO FOLLOW Performed at [facility], [place] [address] [place], [place] [address]    Report Status PENDING  Incomplete  Culture, blood (Routine X 2) w Reflex to ID Panel     Status: Abnormal (Preliminary result)   Collection Time: 06/22/22  6:05 PM   Specimen: BLOOD RIGHT HAND  Result Value Ref Range Status   Specimen Description   Final    BLOOD RIGHT HAND Performed at [facility], [address] [place], [place] [address]    Special Requests   Final    BOTTLES DRAWN AEROBIC AND ANAEROBIC Blood Culture adequate volume Performed at [facility], [address], [place], [place] [address]    Culture  Setup Time   Final    GRAM NEGATIVE  RODS Gram Stain Report Called to,Read Back By and Verified With: HEARP N. AT [id number] ON [id number] BY THOMPSON S. IN BOTH AEROBIC AND ANAEROBIC BOTTLES Performed at [facility], [address] [place], [place] [address]    Culture ESCHERICHIA COLI (A)  Final   Report Status PENDING  Incomplete  Blood Culture ID Panel (Reflexed)     Status: Abnormal    Collection Time: 06/22/22  6:05 PM  Result Value Ref Range Status   Enterococcus faecalis NOT DETECTED NOT DETECTED Final   Enterococcus Faecium NOT DETECTED NOT DETECTED Final   Listeria monocytogenes NOT DETECTED NOT DETECTED Final   Staphylococcus species NOT DETECTED NOT DETECTED Final   Staphylococcus aureus (BCID) NOT DETECTED NOT DETECTED Final   Staphylococcus epidermidis NOT DETECTED NOT DETECTED Final   Staphylococcus lugdunensis NOT DETECTED NOT DETECTED Final   Streptococcus species NOT DETECTED NOT DETECTED Final   Streptococcus agalactiae NOT DETECTED NOT DETECTED Final   Streptococcus pneumoniae NOT DETECTED NOT DETECTED Final   Streptococcus pyogenes NOT DETECTED NOT DETECTED Final   A.calcoaceticus-baumannii NOT DETECTED NOT DETECTED Final   Bacteroides fragilis NOT DETECTED NOT DETECTED Final   Enterobacterales DETECTED (A) NOT DETECTED Final    Comment: Enterobacterales represent a large order of gram negative bacteria, not a single organism. CRITICAL RESULT CALLED TO, READ BACK BY AND VERIFIED WITH: PHARMD T. TALBOTT 06/23/22 @0957  BY AB    Enterobacter cloacae complex NOT DETECTED NOT DETECTED Final   Escherichia coli DETECTED (A) NOT DETECTED Final    Comment: CRITICAL RESULT CALLED TO, READ BACK BY AND VERIFIED WITH: PHARMD T. TALBOTT 06/23/22 @0957  BY AB     Klebsiella aerogenes NOT DETECTED NOT DETECTED Final   Klebsiella oxytoca NOT DETECTED NOT DETECTED Final   Klebsiella pneumoniae NOT DETECTED NOT DETECTED Final   Proteus species NOT DETECTED NOT DETECTED Final   Salmonella species NOT DETECTED NOT DETECTED Final   Serratia marcescens NOT DETECTED NOT DETECTED Final   Haemophilus influenzae NOT DETECTED NOT DETECTED Final   Neisseria meningitidis NOT DETECTED NOT DETECTED Final   Pseudomonas aeruginosa NOT DETECTED NOT DETECTED Final   Stenotrophomonas maltophilia NOT DETECTED NOT DETECTED Final   Candida albicans NOT DETECTED NOT DETECTED Final    Candida auris NOT DETECTED NOT DETECTED Final   Candida glabrata NOT DETECTED NOT DETECTED Final   Candida krusei NOT DETECTED NOT DETECTED Final   Candida parapsilosis NOT DETECTED NOT DETECTED Final   Candida tropicalis NOT DETECTED NOT DETECTED Final   Cryptococcus neoformans/gattii NOT DETECTED NOT DETECTED Final   CTX-M ESBL NOT DETECTED NOT DETECTED Final   Carbapenem resistance IMP NOT DETECTED NOT DETECTED Final   Carbapenem resistance KPC NOT DETECTED NOT DETECTED Final   Carbapenem resistance NDM NOT DETECTED NOT DETECTED Final   Carbapenem resist OXA 48 LIKE NOT DETECTED NOT DETECTED Final   Carbapenem resistance VIM NOT DETECTED NOT DETECTED Final    Comment: Performed at [facility], [address] [place], [place] [address]  Culture, blood (Routine X 2) w Reflex to ID Panel     Status: None (Preliminary result)   Collection Time: 06/23/22 11:05 AM   Specimen: Right Antecubital; Blood  Result Value Ref Range Status   Specimen Description RIGHT ANTECUBITAL  Final   Special Requests   Final    BOTTLES DRAWN AEROBIC AND ANAEROBIC Blood Culture results may not be optimal due to an excessive volume of blood received in culture bottles   Culture   Final  NO GROWTH < 24 HOURS Performed at [facility], [address] [place], [place] [address]    Report Status PENDING  Incomplete  Culture, blood (Routine X 2) w Reflex to ID Panel     Status: None (Preliminary result)   Collection Time: 06/23/22 11:05 AM   Specimen: BLOOD RIGHT HAND  Result Value Ref Range Status   Specimen Description BLOOD RIGHT HAND  Final   Special Requests   Final    BOTTLES DRAWN AEROBIC AND ANAEROBIC Blood Culture adequate volume   Culture   Final    NO GROWTH < 24 HOURS Performed at [facility], [address], [place], [place] [address]    Report Status PENDING  Incomplete  MRSA Next Gen by PCR, Nasal     Status: None   Collection Time: 06/24/22 12:29 AM   Specimen: Nasal Mucosa; Nasal  Swab  Result Value Ref Range Status   MRSA by PCR Next Gen NOT DETECTED NOT DETECTED Final    Comment: (NOTE) The GeneXpert MRSA Assay (FDA approved for NASAL specimens only), is one component of a comprehensive MRSA colonization surveillance program. It is not intended to diagnose MRSA infection nor to guide or monitor treatment for MRSA infections. Test performance is not FDA approved in patients less than 84 years old. Performed at [facility], [address] [place], [place] [address]     Alcide Evener, [place] [facility] [phone] pager  06/24/2022, 3:41 PM

## 2022-06-24 NOTE — Telephone Encounter (Signed)
Spouse called in on patient behalf . FYI patient has been admitted to [facility]  Currently in ICU

## 2022-06-24 NOTE — Progress Notes (Signed)
PT Cancellation Note  Patient Details Name: Darius Smith MRN: [id number] DOB: [birth year]   Cancelled Treatment:    Reason Eval/Treat Not Completed: Medical issues which prohibited therapy.  Patient transferred to a higher level of care and will need new PT consult to resume therapy when patient is medically stable.  Thank you.   7:38 AM, 06/24/22 Lonell Grandchild, MPT Physical Therapist with [facility] 336 [phone] office [phone] mobile phone

## 2022-06-24 NOTE — Progress Notes (Signed)
Date and time results received: 06/24/22 0409  Test: Platelets Critical Value: 26  Name of Provider Notified: Adefeso DO

## 2022-06-24 NOTE — Progress Notes (Addendum)
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  He was reporting intermittent dizziness over the last few days, poor oral intake.   -He was recently hospitalized at [facility] and discharged on 9/25 after being managed for left femur fracture status post ORIF by Dr. Amedeo Kinsman.   -In ED, he was hypotensive, given IV fluids.  Hemoglobin was 8.1.  Creatinine was 3.1, sodium of 129.   Patient was admitted for hypotension and syncope, BP stabilized with IV fluids, AKI on CKD stage IIIb started improving Patient also anemia pancytopenia FOBT negative suspect anemia due to chemotherapy for NHL and anemia of chronic disease. status post 1 unit PRBC transfusion -10/3-4: Blood cultures with GNR -10/-4: BP running low again, given multiple fluid boluses -10//4 overnight started low dose Neosynephrine  -10/5: creatinine up to 3.2 with drop in urine output, concern for hemodynamically mediated ATN, nephrology consulted   Subjective: -Tired, had a transfusion reaction yesterday  Assessment and Plan:  Ecoli bacteremia, UTI Sepsis, poa Hypotension, syncope -Blood pressure was low on admission, and then normalized, dropped again yesterday-poorly responsive to fluid boluses, eventually transferred to the ICU and started on Neo-Synephrine via peripheral IV, very low dose 73mcg now -Continue IV ceftriaxone day 3 of ABX -Urine and blood cultures growing E. coli, repeat blood cultures pending -Random cortisol is normal  AKI on CKD stage IIIb baseline creatinine 1.5-1.8 on presentation 3.1  -Creatinine was improving, now has trended up to 3.2, likely secondary to ATN in the setting of sepsis, hypotension -Continue pressors  to maintain MAPs> 60 today, urine output poor -Bladder scan today -will request Nephrology input  Anemia normocytic likely multifactoria Acute blood loss anemia due to hematoma/from recent left hip fracture and surgery and also  anemia of chronic disease and chemotherapy for NHL: FOBT was negative.  S/P 2 unit of PRBC this admission  he has bruise on his left thigh, had a recent intramedullary nailing of the left femur. -Hemoglobin down to 7.4 this morning, likely dilutional, got multiple fluid boluses yesterday  Thrombocytopenia/leukopenia likely secondary to lymphoma and rituximab, worsened by sepsis, bacteremia  B12 folate LDH normal. Hematology/oncology- Dr. Delton Coombes following-appreciate input.MMA pending.  Platelet count was improving, low this morning likely from dilution as well, continue to trend  Chronic combined systolic/diastolic  -last echo with EF 50% G1 DD in 2022.   -Holding diuretics and IV fluids today  Permanent A-fib: Rate controlled on amiodarone.  Eliquis held this admission due to patient's anemia.  Dr.KC Discussed with patient and his wife at the bedside regarding holding anticoagulation for now will need to be reassessed -Metoprolol discontinued with hypotension, unsafe for anticoagulation with severe thrombocytopenia  NHL w/ CNS involvement last PET scan and MRI of the brain on 06/02/2022 showed complete response to therapy.Followed by heme oncology outpatient   Type 2 diabetes A1c stable in 5,blood sugar well controlled.  Continue to monitor  History of comminuted intertrochanteric fracture of left femur ,s/p ORIF on 9/22.Wound looks clean but was suspected to have intramuscular hematoma.CT hip showed finding of small scattered hematoma but negative for large hematoma or bleeding.  Eliquis remains on hold.  Plan is for skilled nursing facility  Constipation no BM for 3 to 4 days started MiraLAX daily  10/2  DVT prophylaxis: SCDs Start: 06/16/22 1737 Code  Status:   Code Status: DNR Family Communication: Discussed with patient and wife at bedside  Level of care: ICU   Dispo: The patient is from: snf            Anticipated disposition: snf in 2 to 3 days days   Mobility Assessment (last 72 hours)     Mobility Assessment     Row Name 06/23/22 2000 06/23/22 1203 06/23/22 0900 06/23/22 0758 06/22/22 2200   Does patient have an order for bedrest or is patient medically unstable No - Continue assessment -- -- No - Continue assessment No - Continue assessment   What is the highest level of mobility based on the progressive mobility assessment? Level 4 (Walks with assist in room) - Balance while marching in place and cannot step forward and back - Complete Level 4 (Walks with assist in room) - Balance while marching in place and cannot step forward and back - Complete Level 4 (Walks with assist in room) - Balance while marching in place and cannot step forward and back - Complete Level 3 (Stands with assist) - Balance while standing  and cannot march in place Level 3 (Stands with assist) - Balance while standing  and cannot march in place   Is the above level different from baseline mobility prior to current illness? Yes - Recommend PT order -- -- -- Yes - Recommend PT order    [place] Name 06/22/22 1200 06/21/22 2000 06/21/22 1130       Does patient have an order for bedrest or is patient medically unstable No - Continue assessment No - Continue assessment No - Continue assessment     What is the highest level of mobility based on the progressive mobility assessment? Level 3 (Stands with assist) - Balance while standing  and cannot march in place Level 3 (Stands with assist) - Balance while standing  and cannot march in place --     Is the above level different from baseline mobility prior to current illness? 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 MSK: Normal muscle bulk,tone, power   Medications reviewed:  Scheduled Meds:  amiodarone  200 mg Oral Daily   Chlorhexidine Gluconate Cloth  6 each Topical Daily   ferrous sulfate  325 mg Oral Q breakfast   polyethylene glycol  17 g Oral Daily   sodium bicarbonate  650 mg Oral BID   Continuous Infusions:  sodium chloride Stopped (06/24/22 0008)   cefTRIAXone (ROCEPHIN)  IV Stopped (06/23/22 1742)   norepinephrine (LEVOPHED) Adult infusion 2 mcg/min (06/24/22 1014)  Diet Order             Diet regular Room service appropriate? Yes; Fluid consistency: Thin  Diet effective now                  Intake/Output Summary (Last 24 hours) at 06/24/2022 1037 Last data filed at 06/24/2022 1014 Gross per 24 hour  Intake 3272.87 ml  Output 375 ml  Net 2897.87 ml   Net IO Since Admission: 2,233.69 mL [06/24/22 1037]  Wt Readings from Last 3 Encounters:  06/16/22 80.3 kg  06/09/22 81.3 kg  05/18/22 81.9 kg     Unresulted Labs (From admission, onward)     Start     Ordered   06/23/22 0500  Comprehensive metabolic panel  Daily at 5am,   R     Question:  Specimen collection method  Answer:  Lab=Lab collect   06/22/22 1636   06/22/22 1655  CBC with Differential/Platelet  ONCE - STAT,   STAT       Question:  Specimen collection method  Answer:  Lab=Lab collect   06/22/22  1654   06/22/22 0725  Prepare RBC (crossmatch)  (Adult Blood Administration - Red Blood Cells)  Once,   R       Question Answer Comment  # of Units 1 unit   Transfusion Indications Symptomatic Anemia   Number of Units to Keep Ahead NO units ahead   If emergent release call blood bank Not emergent release      06/22/22 0724   06/22/22 0500  CBC  Daily at 5am,   R     Question:  Specimen collection method  Answer:  Unit=Unit collect   06/21/22 1012   Pending  MRSA Next Gen by PCR, Nasal  Once,   R        Pending          Data Reviewed: I have personally reviewed following labs and imaging studies CBC: Recent Labs  Lab 06/22/22 0450 06/22/22 0646 06/22/22 1545 06/22/22 1857 06/23/22 0028 06/24/22 0303  WBC 3.1*  --  0.6* 2.0* 5.3 11.6*  NEUTROABS  --   --   --  1.8  --   --   HGB 6.9* 6.8* 9.2* 8.4* 8.0* 7.4*  HCT 20.9* 20.7* 28.5* 25.1* 24.6* 22.7*  MCV 96.3  --  98.3 94.0 96.5 95.8  PLT 61*  --  28* 35* 43* 24*   Basic Metabolic Panel: Recent Labs  Lab 06/20/22 0611 06/21/22 0704 06/22/22 0450 06/22/22 1545 06/23/22 0028 06/24/22 0303  NA 134* 135 139 136 139 138  K 3.7 3.7 3.8 4.2 4.4 4.2  CL 110 112* 113* 113* 112* 111  CO2 18* 18* 19* 15* 18* 16*  GLUCOSE 107* 111* 101* 112* 166* 126*  BUN 43* 44* 39* 42* 44* 63*  CREATININE 1.71* 1.61* 1.48* 1.91* 2.20* 3.20*  CALCIUM 7.1* 7.3* 7.5* 7.5* 7.2* 7.2*  MG 1.9  --   --   --   --   --    GFR: Estimated Creatinine Clearance: 21.6 mL/min (A) (by C-G formula based on SCr of 3.2 mg/dL (H)). Liver Function Tests: Recent Labs  Lab 06/22/22 1545 06/23/22 0028 06/24/22 0303  AST 47* 44* 24  ALT 57* 53* 38  ALKPHOS 88 87 75  BILITOT 3.2* 3.9* 2.1*  PROT 4.6* 4.1* 4.2*  ALBUMIN 1.9* 1.7* 1.6*   No results for input(s): "LIPASE", "AMYLASE" in the last 168 hours. No results for input(s): "AMMONIA"  in the last 168 hours. Coagulation Profile: No results for input(s): "INR", "PROTIME" in the last 168  hours.  BNP (last 3 results) No results for input(s): "PROBNP" in the last 8760 hours. HbA1C: No results for input(s): "HGBA1C" in the last 72 hours. CBG: Recent Labs  Lab 06/21/22 0358 06/22/22 0456 06/23/22 0545 06/23/22 0718 06/24/22 0440  GLUCAP 112* 95 161* 152* 100*   Lipid Profile: No results for input(s): "CHOL", "HDL", "LDLCALC", "TRIG", "CHOLHDL", "LDLDIRECT" in the last 72 hours. Thyroid Function Tests: No results for input(s): "TSH", "T4TOTAL", "FREET4", "T3FREE", "THYROIDAB" in the last 72 hours. Sepsis Labs: Recent Labs  Lab 06/22/22 1805 06/22/22 2005  LATICACIDVEN 1.9 1.8    Recent Results (from the past 240 hour(s))  SARS Coronavirus 2 by RT PCR (hospital order, performed in [facility] hospital lab) *cepheid single result test* Anterior Nasal Swab     Status: None   Collection Time: 06/14/22 10:42 AM   Specimen: Anterior Nasal Swab  Result Value Ref Range Status   SARS Coronavirus 2 by RT PCR NEGATIVE NEGATIVE Final    Comment: (NOTE) SARS-CoV-2 target nucleic acids are NOT DETECTED.  The SARS-CoV-2 RNA is generally detectable in upper and lower respiratory specimens during the acute phase of infection. The lowest concentration of SARS-CoV-2 viral copies this assay can detect is 250 copies / mL. A negative result does not preclude SARS-CoV-2 infection and should not be used as the sole basis for treatment or other patient management decisions.  A negative result may occur with improper specimen collection / handling, submission of specimen other than nasopharyngeal swab, presence of viral mutation(s) within the areas targeted by this assay, and inadequate number of viral copies (<250 copies / mL). A negative result must be combined with clinical observations, patient history, and epidemiological information.  Fact Sheet for Patients:   https://www.patel.info/  Fact Sheet for Healthcare  Providers: https://hall.com/  This test is not yet approved or  cleared by the [place] FDA and has been authorized for detection and/or diagnosis of SARS-CoV-2 by FDA under an Emergency Use Authorization (EUA).  This EUA will remain in effect (meaning this test can be used) for the duration of the COVID-19 declaration under Section 564(b)(1) of the Act, 21 U.S.C. section 360bbb-3(b)(1), unless the authorization is terminated or revoked sooner.  Performed at [facility], [address] [place], [place] [address]   SARS Coronavirus 2 by RT PCR (hospital order, performed in [facility] hospital lab) *cepheid single result test* Anterior Nasal Swab     Status: None   Collection Time: 06/18/22  2:03 PM   Specimen: Anterior Nasal Swab  Result Value Ref Range Status   SARS Coronavirus 2 by RT PCR NEGATIVE NEGATIVE Final    Comment: (NOTE) SARS-CoV-2 target nucleic acids are NOT DETECTED.  The SARS-CoV-2 RNA is generally detectable in upper and lower respiratory specimens during the acute phase of infection. The lowest concentration of SARS-CoV-2 viral copies this assay can detect is 250 copies / mL. A negative result does not preclude SARS-CoV-2 infection and should not be used as the sole basis for treatment or other patient management decisions.  A negative result may occur with improper specimen collection / handling, submission of specimen other than nasopharyngeal swab, presence of viral mutation(s) within the areas targeted by this assay, and inadequate number of viral copies (<250 copies / mL). A negative result must be combined with clinical observations, patient history, and epidemiological information.  Fact Sheet for Patients:  https://www.patel.info/  Fact Sheet for Healthcare Providers: https://hall.com/  This test is not yet approved or  cleared by the [place] FDA and has been authorized for  detection and/or diagnosis of SARS-CoV-2 by FDA under an Emergency Use Authorization (EUA).  This EUA will remain in effect (meaning this test can be used) for the duration of the COVID-19 declaration under Section 564(b)(1) of the Act, 21 U.S.C. section 360bbb-3(b)(1), unless the authorization is terminated or revoked sooner.  Performed at [facility], [address] [place], [place] [address]   SARS Coronavirus 2 by RT PCR (hospital order, performed in [facility] hospital lab) *cepheid single result test* Anterior Nasal Swab     Status: None   Collection Time: 06/21/22  9:07 AM   Specimen: Anterior Nasal Swab  Result Value Ref Range Status   SARS Coronavirus 2 by RT PCR NEGATIVE NEGATIVE Final    Comment: (NOTE) SARS-CoV-2 target nucleic acids are NOT DETECTED.  The SARS-CoV-2 RNA is generally detectable in upper and lower respiratory specimens during the acute phase of infection. The lowest concentration of SARS-CoV-2 viral copies this assay can detect is 250 copies / mL. A negative result does not preclude SARS-CoV-2 infection and should not be used as the sole basis for treatment or other patient management decisions.  A negative result may occur with improper specimen collection / handling, submission of specimen other than nasopharyngeal swab, presence of viral mutation(s) within the areas targeted by this assay, and inadequate number of viral copies (<250 copies / mL). A negative result must be combined with clinical observations, patient history, and epidemiological information.  Fact Sheet for Patients:   https://www.patel.info/  Fact Sheet for Healthcare Providers: https://hall.com/  This test is not yet approved or  cleared by the [place] FDA and has been authorized for detection and/or diagnosis of SARS-CoV-2 by FDA under an Emergency Use Authorization (EUA).  This EUA will remain in effect (meaning this test can be  used) for the duration of the COVID-19 declaration under Section 564(b)(1) of the Act, 21 U.S.C. section 360bbb-3(b)(1), unless the authorization is terminated or revoked sooner.  Performed at [facility], [address], [place], [place] [address]   Urine Culture     Status: Abnormal   Collection Time: 06/21/22  8:31 PM   Specimen: Urine, Clean Catch  Result Value Ref Range Status   Specimen Description   Final    URINE, CLEAN CATCH Performed at [facility], [address] [place], [place] [address]    Special Requests   Final    NONE Performed at [facility], [address], [place], [place] [address]    Culture >=100,000 COLONIES/mL ESCHERICHIA COLI (A)  Final   Report Status 06/24/2022 FINAL  Final   Organism ID, Bacteria ESCHERICHIA COLI (A)  Final      Susceptibility   Escherichia coli - MIC*    AMPICILLIN >=32 RESISTANT Resistant     CEFAZOLIN <=4 SENSITIVE Sensitive     CEFEPIME <=0.12 SENSITIVE Sensitive     CEFTRIAXONE <=0.25 SENSITIVE Sensitive     CIPROFLOXACIN <=0.25 SENSITIVE Sensitive     GENTAMICIN <=1 SENSITIVE Sensitive     IMIPENEM <=0.25 SENSITIVE Sensitive     NITROFURANTOIN <=16 SENSITIVE Sensitive     TRIMETH/SULFA <=20 SENSITIVE Sensitive     AMPICILLIN/SULBACTAM >=32 RESISTANT Resistant     PIP/TAZO <=4 SENSITIVE Sensitive     * >=100,000 COLONIES/mL ESCHERICHIA COLI  Culture, blood (Routine X 2) w Reflex to ID  Panel     Status: Abnormal (Preliminary result)   Collection Time: 06/22/22  6:05 PM   Specimen: BLOOD RIGHT HAND  Result Value Ref Range Status   Specimen Description   Final    BLOOD RIGHT HAND Performed at [facility], [address] [place], [place] [address]    Special Requests   Final    BOTTLES DRAWN AEROBIC AND ANAEROBIC Blood Culture adequate volume Performed at [facility], [address], [place], [place] [address]    Culture  Setup Time   Final    GRAM NEGATIVE RODS Gram Stain Report Called to,Read Back By and Verified  With: HEARP N. AT [id number] ON [id number] BY THOMPSON S. IN BOTH AEROBIC AND ANAEROBIC BOTTLES CRITICAL RESULT CALLED TO, READ BACK BY AND VERIFIED WITH: PHARMD TINA TALBOTT 06/23/22 0957 BY AB    Culture (A)  Final    ESCHERICHIA COLI SUSCEPTIBILITIES TO FOLLOW Performed at [facility], [place] [address] [place], [place] [address]    Report Status PENDING  Incomplete  Culture, blood (Routine X 2) w Reflex to ID Panel     Status: Abnormal (Preliminary result)   Collection Time: 06/22/22  6:05 PM   Specimen: BLOOD RIGHT HAND  Result Value Ref Range Status   Specimen Description   Final    BLOOD RIGHT HAND Performed at [facility], [address], [place], [place] [address]    Special Requests   Final    BOTTLES DRAWN AEROBIC AND ANAEROBIC Blood Culture adequate volume Performed at [facility], [address] [place], [place] [address]    Culture  Setup Time   Final    GRAM NEGATIVE RODS Gram Stain Report Called to,Read Back By and Verified With: HEARP N. AT [id number] ON [id number] BY THOMPSON S. IN BOTH AEROBIC AND ANAEROBIC BOTTLES Performed at [facility], [address] [place],  [address]    Culture ESCHERICHIA COLI (A)  Final   Report Status PENDING  Incomplete  Blood Culture ID Panel (Reflexed)     Status: Abnormal   Collection Time: 06/22/22  6:05 PM  Result Value Ref Range Status   Enterococcus faecalis NOT DETECTED NOT DETECTED Final   Enterococcus Faecium NOT DETECTED NOT DETECTED Final   Listeria monocytogenes NOT DETECTED NOT DETECTED Final   Staphylococcus species NOT DETECTED NOT DETECTED Final   Staphylococcus aureus (BCID) NOT DETECTED NOT DETECTED Final   Staphylococcus epidermidis NOT DETECTED NOT DETECTED Final   Staphylococcus lugdunensis NOT DETECTED NOT DETECTED Final   Streptococcus species NOT DETECTED NOT DETECTED Final   Streptococcus agalactiae NOT DETECTED NOT DETECTED Final   Streptococcus pneumoniae NOT DETECTED NOT DETECTED Final   Streptococcus  pyogenes NOT DETECTED NOT DETECTED Final   A.calcoaceticus-baumannii NOT DETECTED NOT DETECTED Final   Bacteroides fragilis NOT DETECTED NOT DETECTED Final   Enterobacterales DETECTED (A) NOT DETECTED Final    Comment: Enterobacterales represent a large order of gram negative bacteria, not a single organism. CRITICAL RESULT CALLED TO, READ BACK BY AND VERIFIED WITH: PHARMD T. TALBOTT 06/23/22 @0957  BY AB    Enterobacter cloacae complex NOT DETECTED NOT DETECTED Final   Escherichia coli DETECTED (A) NOT DETECTED Final    Comment: CRITICAL RESULT CALLED TO, READ BACK BY AND VERIFIED WITH: PHARMD T. TALBOTT 06/23/22 @0957  BY AB     Klebsiella aerogenes NOT DETECTED NOT DETECTED Final   Klebsiella oxytoca NOT DETECTED NOT DETECTED Final   Klebsiella pneumoniae NOT DETECTED NOT DETECTED Final   Proteus  species NOT DETECTED NOT DETECTED Final   Salmonella species NOT DETECTED NOT DETECTED Final   Serratia marcescens NOT DETECTED NOT DETECTED Final   Haemophilus influenzae NOT DETECTED NOT DETECTED Final   Neisseria meningitidis NOT DETECTED NOT DETECTED Final   Pseudomonas aeruginosa NOT DETECTED NOT DETECTED Final   Stenotrophomonas maltophilia NOT DETECTED NOT DETECTED Final   Candida albicans NOT DETECTED NOT DETECTED Final   Candida auris NOT DETECTED NOT DETECTED Final   Candida glabrata NOT DETECTED NOT DETECTED Final   Candida krusei NOT DETECTED NOT DETECTED Final   Candida parapsilosis NOT DETECTED NOT DETECTED Final   Candida tropicalis NOT DETECTED NOT DETECTED Final   Cryptococcus neoformans/gattii NOT DETECTED NOT DETECTED Final   CTX-M ESBL NOT DETECTED NOT DETECTED Final   Carbapenem resistance IMP NOT DETECTED NOT DETECTED Final   Carbapenem resistance KPC NOT DETECTED NOT DETECTED Final   Carbapenem resistance NDM NOT DETECTED NOT DETECTED Final   Carbapenem resist OXA 48 LIKE NOT DETECTED NOT DETECTED Final   Carbapenem resistance VIM NOT DETECTED NOT DETECTED Final     Comment: Performed at [facility], [address], [place], [place] [address]  Culture, blood (Routine X 2) w Reflex to ID Panel     Status: None (Preliminary result)   Collection Time: 06/23/22 11:05 AM   Specimen: Right Antecubital; Blood  Result Value Ref Range Status   Specimen Description RIGHT ANTECUBITAL  Final   Special Requests   Final    BOTTLES DRAWN AEROBIC AND ANAEROBIC Blood Culture results may not be optimal due to an excessive volume of blood received in culture bottles   Culture   Final    NO GROWTH < 24 HOURS Performed at [facility], [address], [place], [place] [address]    Report Status PENDING  Incomplete  Culture, blood (Routine X 2) w Reflex to ID Panel     Status: None (Preliminary result)   Collection Time: 06/23/22 11:05 AM   Specimen: BLOOD RIGHT HAND  Result Value Ref Range Status   Specimen Description BLOOD RIGHT HAND  Final   Special Requests   Final    BOTTLES DRAWN AEROBIC AND ANAEROBIC Blood Culture adequate volume   Culture   Final    NO GROWTH < 24 HOURS Performed at [facility], [address] [place], [place] [address]    Report Status PENDING  Incomplete  MRSA Next Gen by PCR, Nasal     Status: None   Collection Time: 06/24/22 12:29 AM   Specimen: Nasal Mucosa; Nasal Swab  Result Value Ref Range Status   MRSA by PCR Next Gen NOT DETECTED NOT DETECTED Final    Comment: (NOTE) The GeneXpert MRSA Assay (FDA approved for NASAL specimens only), is one component of a comprehensive MRSA colonization surveillance program. It is not intended to diagnose MRSA infection nor to guide or monitor treatment for MRSA infections. Test performance is not FDA approved in patients less than 52 years old. Performed at [facility], [address] [place],  [address]     Antimicrobials: Anti-infectives (From admission, onward)    Start     Dose/Rate Route Frequency Ordered Stop   06/23/22 1800  cefTRIAXone (ROCEPHIN) 2 g in sodium  chloride 0.9 % 100 mL IVPB        2 g 200 mL/hr over 30 Minutes Intravenous Every 24 hours 06/23/22 1326     06/22/22 1800  ceFEPIme (MAXIPIME) 2 g in sodium chloride 0.9 %  100 mL IVPB  Status:  Discontinued        2 g 200 mL/hr over 30 Minutes Intravenous Every 12 hours 06/22/22 1708 06/23/22 1325      Culture/Microbiology    Component Value Date/Time   SDES RIGHT ANTECUBITAL 06/23/2022 1105   SDES BLOOD RIGHT HAND 06/23/2022 1105   SPECREQUEST  06/23/2022 1105    BOTTLES DRAWN AEROBIC AND ANAEROBIC Blood Culture results may not be optimal due to an excessive volume of blood received in culture bottles   SPECREQUEST  06/23/2022 1105    BOTTLES DRAWN AEROBIC AND ANAEROBIC Blood Culture adequate volume   CULT  06/23/2022 1105    NO GROWTH < 24 HOURS Performed at [facility], [address] [place], [place] [place] [id number]    CULT  06/23/2022 1105    NO GROWTH < 24 HOURS Performed at [facility], [address], [place],  [address]    REPTSTATUS PENDING 06/23/2022 1105   REPTSTATUS PENDING 06/23/2022 1105    Other culture-see note  Radiology Studies: DG CHEST PORT 1 VIEW  Result Date: 06/23/2022 CLINICAL DATA:  Shortness of breath EXAM: PORTABLE CHEST 1 VIEW COMPARISON:  06/22/2022 FINDINGS: Moderate cardiomegaly is unchanged. Left axillary surgical clips. No focal airspace consolidation or pulmonary edema. IMPRESSION: Unchanged cardiomegaly. Electronically Signed   By: Ulyses Jarred M.D.   On: 06/23/2022 04:02   DG Chest Port 1 View  Result Date: 06/22/2022 CLINICAL DATA:  10026 Shortness of breath 10026 EXAM: PORTABLE CHEST 1 VIEW COMPARISON:  September 27, 2021 FINDINGS: The cardiomediastinal silhouette is unchanged in contour.LEFT axillary clips. No pleural effusion. No pneumothorax. Similar appearance of chronic interstitial and bronchial markings. No acute pleuroparenchymal abnormality. Visualized abdomen is unremarkable. IMPRESSION: No acute cardiopulmonary abnormality.  Electronically Signed   By: Valentino Saxon M.D.   On: 06/22/2022 14:38   DG Pelvis Portable  Result Date: 06/22/2022 CLINICAL DATA:  Intertrochanteric fracture left femur. EXAM: LEFT FEMUR PORTABLE 2 VIEWS; PORTABLE PELVIS 1-2 VIEWS COMPARISON:  Left femur radiographs 06/11/2022 FINDINGS: Redemonstration of long cephalomedullary nail fixation for intertrochanteric left femoral fracture. There is again near anatomic alignment. Mild medial displacement of lesser trochanter is unchanged. Resolution of the prior postoperative subcutaneous air at the lateral pelvis, lateral proximal thigh, and lateral distal thigh. No perihardware lucency is seen to indicate hardware failure or loosening. Mild medial compartment of the knee a left wrist separate joint space narrowing. IMPRESSION: Status post left femoral intramedullary nail fixation without evidence of hardware failure. Electronically Signed   By: Yvonne Kendall M.D.   On: 06/22/2022 13:05   DG FEMUR PORT MIN 2 VIEWS LEFT  Result Date: 06/22/2022 CLINICAL DATA:  Intertrochanteric fracture left femur. EXAM: LEFT FEMUR PORTABLE 2 VIEWS; PORTABLE PELVIS 1-2 VIEWS COMPARISON:  Left femur radiographs 06/11/2022 FINDINGS: Redemonstration of long cephalomedullary nail fixation for intertrochanteric left femoral fracture. There is again near anatomic alignment. Mild medial displacement of lesser trochanter is unchanged. Resolution of the prior postoperative subcutaneous air at the lateral pelvis, lateral proximal thigh, and lateral distal thigh. No perihardware lucency is seen to indicate hardware failure or loosening. Mild medial compartment of the knee a left wrist separate joint space narrowing. IMPRESSION: Status post left femoral intramedullary nail fixation without evidence of hardware failure. Electronically Signed   By: Yvonne Kendall M.D.   On: 06/22/2022 13:05     LOS: 7 days   Domenic Polite, MD [facility]  06/24/2022, 10:37 AM

## 2022-06-25 DIAGNOSIS — A419 Sepsis, unspecified organism: Secondary | ICD-10-CM

## 2022-06-25 DIAGNOSIS — N179 Acute kidney failure, unspecified: Secondary | ICD-10-CM | POA: Diagnosis not present

## 2022-06-25 DIAGNOSIS — I5042 Chronic combined systolic (congestive) and diastolic (congestive) heart failure: Secondary | ICD-10-CM | POA: Diagnosis not present

## 2022-06-25 DIAGNOSIS — N390 Urinary tract infection, site not specified: Secondary | ICD-10-CM

## 2022-06-25 DIAGNOSIS — E861 Hypovolemia: Secondary | ICD-10-CM | POA: Diagnosis not present

## 2022-06-25 DIAGNOSIS — I9589 Other hypotension: Secondary | ICD-10-CM | POA: Diagnosis not present

## 2022-06-25 DIAGNOSIS — R7881 Bacteremia: Secondary | ICD-10-CM

## 2022-06-25 DIAGNOSIS — I482 Chronic atrial fibrillation, unspecified: Secondary | ICD-10-CM | POA: Diagnosis not present

## 2022-06-25 LAB — CULTURE, BLOOD (ROUTINE X 2)
Special Requests: ADEQUATE
Special Requests: ADEQUATE

## 2022-06-25 LAB — COMPREHENSIVE METABOLIC PANEL
ALT: 29 U/L (ref 0–44)
AST: 18 U/L (ref 15–41)
Albumin: 1.6 g/dL — ABNORMAL LOW (ref 3.5–5.0)
Alkaline Phosphatase: 72 U/L (ref 38–126)
Anion gap: 8 (ref 5–15)
BUN: 77 mg/dL — ABNORMAL HIGH (ref 8–23)
CO2: 16 mmol/L — ABNORMAL LOW (ref 22–32)
Calcium: 7.4 mg/dL — ABNORMAL LOW (ref 8.9–10.3)
Chloride: 112 mmol/L — ABNORMAL HIGH (ref 98–111)
Creatinine, Ser: 3.36 mg/dL — ABNORMAL HIGH (ref 0.61–1.24)
GFR, Estimated: 18 mL/min — ABNORMAL LOW (ref >60)
Glucose, Bld: 97 mg/dL (ref 70–99)
Potassium: 4.3 mmol/L (ref 3.5–5.1)
Sodium: 136 mmol/L (ref 135–145)
Total Bilirubin: 1.3 mg/dL — ABNORMAL HIGH (ref 0.3–1.2)
Total Protein: 4.1 g/dL — ABNORMAL LOW (ref 6.5–8.1)

## 2022-06-25 LAB — CBC
HCT: 23.3 % — ABNORMAL LOW (ref 39.0–52.0)
Hemoglobin: 7.8 g/dL — ABNORMAL LOW (ref 13.0–17.0)
MCH: 31.1 pg (ref 26.0–34.0)
MCHC: 33.5 g/dL (ref 30.0–36.0)
MCV: 92.8 fL (ref 80.0–100.0)
Platelets: 12 10*3/uL — CL (ref 150–400)
RBC: 2.51 MIL/uL — ABNORMAL LOW (ref 4.22–5.81)
RDW: 15.9 % — ABNORMAL HIGH (ref 11.5–15.5)
WBC: 8.4 10*3/uL (ref 4.0–10.5)
nRBC: 0 % (ref 0.0–0.2)

## 2022-06-25 LAB — GLUCOSE, CAPILLARY: Glucose-Capillary: 89 mg/dL (ref 70–99)

## 2022-06-25 MED ORDER — SODIUM CHLORIDE 0.9% IV SOLUTION: Freq: Once | INTRAVENOUS | Status: AC

## 2022-06-25 MED ORDER — CEFAZOLIN SODIUM-DEXTROSE 2-4 GM/100ML-% IV SOLN
2.0000 g | Freq: Two times a day (BID) | INTRAVENOUS | Status: DC
  Administered 2022-06-25 – 2022-06-28 (×6): 2 g via INTRAVENOUS
  Filled 2022-06-25 (×6): qty 100

## 2022-06-25 NOTE — Care Management Important Message (Signed)
Important Message  Patient Details  Name: Darius Smith MRN: [id number] Date of Birth: 1946/08/10   Medicare Important Message Given:  Yes (spoke with spouse Braydan Marriott at [phone] to review letter, no additional copy needed)     Tommy Medal 06/25/2022, 3:40 PM

## 2022-06-25 NOTE — Progress Notes (Signed)
Pharmacy Antibiotic Note  Darius Smith is a 76 y.o. male admitted on 06/16/2022 with bacteremia.  Pharmacy has been consulted for cefazolin dosing.  Plan: Cefazolin 2000 mg IV every 12 hours. Monitor labs, c/s, and patient improvement.  Height: 6' (182.9 cm) Weight: 85.3 kg (188 lb 0.8 oz) IBW/kg (Calculated) : 77.6  Temp (24hrs), Avg:97.7 F (36.5 C), Min:97.5 F (36.4 C), Max:98 F (36.7 C)  Recent Labs  Lab 06/22/22 0450 06/22/22 1545 06/22/22 1805 06/22/22 1857 06/22/22 2005 06/23/22 0028 06/24/22 0303 06/25/22 0341  WBC 3.1* 0.6*  --  2.0*  --  5.3 11.6* 8.4  CREATININE 1.48* 1.91*  --   --   --  2.20* 3.20* 3.36*  LATICACIDVEN  --   --  1.9  --  1.8  --   --   --     Estimated Creatinine Clearance: 20.5 mL/min (A) (by C-G formula based on SCr of 3.36 mg/dL (H)).    Allergies  Allergen Reactions   Flomax [Tamsulosin Hcl] Nausea Only    Dizziness  Pt is on flomax   Heparin Other (See Comments)    Tingling in face and shortness of breath    Antimicrobials this admission: Cefepime 10/3 >>10/4 CTX 10/4 >> 10/5 Cefazolin 10/6 >>  Microbiology results: 10/4 Bcx: ngtd 10/3 BCx: e. Coli BCID: e. coli 10/3 UCx: >100k e. Coli MRSA PCR neg  Thank you for allowing pharmacy to be a part of this patient's care.  Ramond Craver 06/25/2022 3:29 PM

## 2022-06-25 NOTE — Progress Notes (Signed)
Report and hand-off called to [facility], LPN, on [id number].

## 2022-06-25 NOTE — Progress Notes (Addendum)
Physical Therapy Treatment Patient Details Name: Darius Smith MRN: [id number] DOB: Feb 01, 1946 Today's Date: 06/25/2022   History of Present Illness Darius Smith is a 76 y.o. male with medical history significant for CKD 3, COPD, systolic and diastolic CHF, atrial fibrillation, lung cancer with brain mets and Hodgkin's lymphoma.  Patient was brought to the ED from [facility] with reports of passing out.  Patient's spouse and soon-to-be daughter-in-law [name] were at the [facility] center, assisting patient up to the bathroom, when he was upright he felt dizzy, and passed out transiently, he leaned his head against the wall he did not hit his head.  Reports intermittent dizziness over the past few days.  Reports poor oral intake over the past several days, no vomiting, reports 3 bowel movements yesterday.  No abdominal pain.  No chest pain or difficulty breathing.  No cough no fevers no chills.  Reports persistent pain in his left hip where he had recent surgery, he has not been able to bear weight or ambulate    PT Comments    REASSESSMENT: Patient with labored movement for sitting up at bedside requiring min-mod assist for elevating trunk and moving L LE to EOB. Patient limited to a few side steps to transfer to chair requiring RW and mod assist due to weakness. Patient tolerated sitting up in chair after therapy. Patient will benefit from continued skilled physical therapy in hospital and recommended venue below to increase strength, balance, endurance for safe ADLs and gait.    Recommendations for follow up therapy are one component of a multi-disciplinary discharge planning process, led by the attending physician.  Recommendations may be updated based on patient status, additional functional criteria and insurance authorization.  Follow Up Recommendations  Skilled nursing-short term rehab (<3 hours/day) Can patient physically be transported by private vehicle: No   Assistance Recommended at Discharge  Set up Supervision/Assistance  Patient can return home with the following A lot of help with walking and/or transfers;A lot of help with bathing/dressing/bathroom;Assistance with cooking/housework;Help with stairs or ramp for entrance   Equipment Recommendations  None recommended by PT    Recommendations for Other Services       Precautions / Restrictions Precautions Precautions: Fall Restrictions Weight Bearing Restrictions: Yes LLE Weight Bearing: Weight bearing as tolerated     Mobility  Bed Mobility Overal bed mobility: Needs Assistance Bed Mobility: Supine to Sit     Supine to sit: HOB elevated, Min assist, Mod assist Sit to supine: Min assist   General bed mobility comments: labored movement, required hand held assist for elevating trunk, min/mod assist for moving L LE off EOB    Transfers Overall transfer level: Needs assistance Equipment used: Rolling walker (2 wheels) Transfers: Sit to/from Stand, Bed to chair/wheelchair/BSC Sit to Stand: Mod assist   Step pivot transfers: Mod assist       General transfer comment: labored movement, unsteady on feet, mod assist for sit to stand and taking steps to transfer to chair due to weakness and left hip pain    Ambulation/Gait Ambulation/Gait assistance: Mod assist Gait Distance (Feet): 5 Feet Assistive device: Rolling walker (2 wheels) Gait Pattern/deviations: Decreased step length - left, Decreased stance time - left, Decreased step length - right, Decreased stride length Gait velocity: decreased     General Gait Details: unsteady on feet, limited to a few steps to transfer to chair with RW and mod assist   Stairs  Wheelchair Mobility    Modified Rankin (Stroke Patients Only)       Balance Overall balance assessment: Needs assistance Sitting-balance support: Feet supported, Bilateral upper extremity supported Sitting balance-Leahy Scale: Good Sitting balance - Comments: seated EOB    Standing balance support: Bilateral upper extremity supported, During functional activity, Reliant on assistive device for balance Standing balance-Leahy Scale: Poor Standing balance comment: poor/fair using RW                            Cognition Arousal/Alertness: Awake/alert Behavior During Therapy: WFL for tasks assessed/performed Overall Cognitive Status: Within Functional Limits for tasks assessed                                          Exercises General Exercises - Lower Extremity Long Arc Quad: AROM, AAROM, Left, Seated, 15 reps Hip Flexion/Marching: AAROM, Left, Seated, AROM, 10 reps Toe Raises: Seated, AROM, Strengthening, Both, 15 reps Heel Raises: Seated, AROM, Strengthening, Both, 15 reps    General Comments        Pertinent Vitals/Pain Pain Assessment Pain Assessment: Faces Faces Pain Scale: Hurts little more Pain Location: L hip Pain Descriptors / Indicators: Sore, Grimacing, Guarding Pain Intervention(s): Limited activity within patient's tolerance, Monitored during session, Repositioned    Home Living                          Prior Function            PT Goals (current goals can now be found in the care plan section) Acute Rehab PT Goals Patient Stated Goal: return home after rehab PT Goal Formulation: With patient/family Time For Goal Achievement: 07/09/22 Potential to Achieve Goals: Good Progress towards PT goals: Progressing toward goals    Frequency    Min 3X/week      PT Plan Current plan remains appropriate    Co-evaluation              AM-PAC PT "6 Clicks" Mobility   Outcome Measure  Help needed turning from your back to your side while in a flat bed without using bedrails?: A Little Help needed moving from lying on your back to sitting on the side of a flat bed without using bedrails?: A Lot Help needed moving to and from a bed to a chair (including a wheelchair)?: A Lot Help needed  standing up from a chair using your arms (e.g., wheelchair or bedside chair)?: A Lot Help needed to walk in hospital room?: A Lot Help needed climbing 3-5 steps with a railing? : Total 6 Click Score: 12    End of Session   Activity Tolerance: Patient tolerated treatment well;Patient limited by fatigue Patient left: in chair;with call bell/phone within reach;with family/visitor present Nurse Communication: Mobility status PT Visit Diagnosis: Unsteadiness on feet (R26.81);Other abnormalities of gait and mobility (R26.89);Muscle weakness (generalized) (M62.81)     Time: 3614-4315 PT Time Calculation (min) (ACUTE ONLY): 25 min  Charges:  $Therapeutic Exercise: 8-22 mins $Therapeutic Activity: 8-22 mins                     Zigmund Gottron, SPT

## 2022-06-25 NOTE — Progress Notes (Signed)
Patient ID: Darius Smith, male   DOB: 25-Jan-1946, 76 y.o.   MRN: [id number] S:Feeling better this morning.  He is off of pressors and able to drink fluids but develops N/V with food.  UOP has significantly improved over the last 24 hours. O:BP 116/60 (BP Location: Right Arm)   Pulse (!) 113   Temp 97.9 F (36.6 C) (Axillary)   Resp 17   Ht 6' (1.829 m)   Wt 85.3 kg   SpO2 96%   BMI 25.50 kg/m   Intake/Output Summary (Last 24 hours) at 06/25/2022 1011 Last data filed at 06/25/2022 0824 Gross per 24 hour  Intake 174.97 ml  Output 650 ml  Net -475.03 ml   Intake/Output: I/O last 3 completed shifts: In: 1230.4 [I.V.:138.7; IV Piggyback:1091.7] Out: 825 [Urine:825]  Intake/Output this shift:  No intake/output data recorded. Weight change:  Gen: NAD CVS: Tachy, no rub Resp: CTA Abd: +BS, soft, NT/ND Ext: 1+ pretibial edema b/l, 2 + presacral edema  Recent Labs  Lab 06/20/22 0611 06/21/22 0704 06/22/22 0450 06/22/22 1545 06/23/22 0028 06/24/22 0303 06/25/22 0341  NA 134* 135 139 136 139 138 136  K 3.7 3.7 3.8 4.2 4.4 4.2 4.3  CL 110 112* 113* 113* 112* 111 112*  CO2 18* 18* 19* 15* 18* 16* 16*  GLUCOSE 107* 111* 101* 112* 166* 126* 97  BUN 43* 44* 39* 42* 44* 63* 77*  CREATININE 1.71* 1.61* 1.48* 1.91* 2.20* 3.20* 3.36*  ALBUMIN  --   --   --  1.9* 1.7* 1.6* 1.6*  CALCIUM 7.1* 7.3* 7.5* 7.5* 7.2* 7.2* 7.4*  AST  --   --   --  47* 44* 24 18  ALT  --   --   --  57* 53* 38 29   Liver Function Tests: Recent Labs  Lab 06/23/22 0028 06/24/22 0303 06/25/22 0341  AST 44* 24 18  ALT 53* 38 29  ALKPHOS 87 75 72  BILITOT 3.9* 2.1* 1.3*  PROT 4.1* 4.2* 4.1*  ALBUMIN 1.7* 1.6* 1.6*   No results for input(s): "LIPASE", "AMYLASE" in the last 168 hours. No results for input(s): "AMMONIA" in the last 168 hours. CBC: Recent Labs  Lab 06/22/22 1545 06/22/22 1857 06/23/22 0028 06/24/22 0303 06/25/22 0341  WBC 0.6* 2.0* 5.3 11.6* 8.4  NEUTROABS  --  1.8  --   --   --    HGB 9.2* 8.4* 8.0* 7.4* 7.8*  HCT 28.5* 25.1* 24.6* 22.7* 23.3*  MCV 98.3 94.0 96.5 95.8 92.8  PLT 28* 35* 43* 24* 12*   Cardiac Enzymes: No results for input(s): "CKTOTAL", "CKMB", "CKMBINDEX", "TROPONINI" in the last 168 hours. CBG: Recent Labs  Lab 06/22/22 0456 06/23/22 0545 06/23/22 0718 06/24/22 0440 06/25/22 0445  GLUCAP 95 161* 152* 100* 89    Iron Studies: No results for input(s): "IRON", "TIBC", "TRANSFERRIN", "FERRITIN" in the last 72 hours. Studies/Results: US RENAL  Result Date: 06/24/2022 CLINICAL DATA:  341937 AKI (acute kidney injury) ([place]) [id number] EXAM: RENAL / URINARY TRACT ULTRASOUND COMPLETE COMPARISON:  June 02, 2021 FINDINGS: Right Kidney: Renal measurements: 8.4 x 4.3 x 4.4 cm = volume: 84 mL. Echogenicity is mildly increased. No mass or hydronephrosis visualized. Cortical thinning. This is similar in comparison to prior Left Kidney: Renal measurements: 13.1 x 5.8 x 7.2 cm = volume: 288 mL. Echogenicity within the upper limits of normal. No suspicious mass or hydronephrosis visualized. There is a benign 6 cm cyst noted (for which no dedicated imaging follow-up  is recommended). Bladder: Subjectively thickened bladder walls with a trabeculated appearance. Other: None. IMPRESSION: 1. No hydronephrosis. 2. Bladder wall prominence with trabeculation. Findings could reflect sequela of chronic outlet obstruction or infection. Recommend correlation with urine analysis. Electronically Signed   By: Valentino Saxon M.D.   On: 06/24/2022 12:48    sodium chloride   Intravenous Once   amiodarone  200 mg Oral Daily   Chlorhexidine Gluconate Cloth  6 each Topical Daily   ferrous sulfate  325 mg Oral Q breakfast   polyethylene glycol  17 g Oral Daily   sodium bicarbonate  650 mg Oral BID    BMET    Component Value Date/Time   NA 136 06/25/2022 0341   NA 144 08/10/2021 0833   K 4.3 06/25/2022 0341   CL 112 (H) 06/25/2022 0341   CO2 16 (L) 06/25/2022 0341   GLUCOSE  97 06/25/2022 0341   BUN 77 (H) 06/25/2022 0341   BUN 13 08/10/2021 0833   CREATININE 3.36 (H) 06/25/2022 0341   CREATININE 1.56 (H) 07/09/2019 0850   CALCIUM 7.4 (L) 06/25/2022 0341   GFRNONAA 18 (L) 06/25/2022 0341   GFRNONAA 43 (L) 07/09/2019 0850   GFRAA 55 (L) 06/11/2020 1455   GFRAA 50 (L) 07/09/2019 0850   CBC    Component Value Date/Time   WBC 8.4 06/25/2022 0341   RBC 2.51 (L) 06/25/2022 0341   HGB 7.8 (L) 06/25/2022 0341   HGB 12.4 (L) 08/10/2021 0833   HCT 23.3 (L) 06/25/2022 0341   HCT 38.3 08/10/2021 0833   PLT 12 (LL) 06/25/2022 0341   PLT 261 08/10/2021 0833   MCV 92.8 06/25/2022 0341   MCV 93 08/10/2021 0833   MCH 31.1 06/25/2022 0341   MCHC 33.5 06/25/2022 0341   RDW 15.9 (H) 06/25/2022 0341   RDW 13.6 08/10/2021 0833   LYMPHSABS 0.1 (L) 06/22/2022 1857   LYMPHSABS 2.1 08/10/2021 0833   MONOABS 0.1 06/22/2022 1857   EOSABS 0.0 06/22/2022 1857   EOSABS 0.5 (H) 08/10/2021 0833   BASOSABS 0.0 06/22/2022 1857   BASOSABS 0.1 08/10/2021 0833      Assessment/Plan:  AKI/CKD stage IIIb - presumably ischemic ATN in setting of E. Coli urosepsis and need for pressors.  UOP had dropped from 1150 on 10/2, 650 on 10/3, 200 on 10/4 but increased to 825 yesterday.  Renal US negative for obstruction.  Continue to follow bladder scans.  Agree with holding diuretics for now given hypotension.  Discussed possible need for dialysis, however no urgent indication at this time.  Rate of rise in Scr has slowed and hopefully will see improvement of Scr over the next 24-48 hours now that he is off of pressors and maintaining stable bp.  Avoid nephrotoxic medications including NSAIDs and iodinated intravenous contrast exposure unless the latter is absolutely indicated.  Preferred narcotic agents for pain control are hydromorphone, fentanyl, and methadone. Morphine should not be used.  Avoid Baclofen and avoid oral sodium phosphate and magnesium citrate based laxatives / bowel preps.   Continue strict Input and Output monitoring. Will monitor the patient closely with you and intervene or adjust therapy as indicated by changes in clinical status/labs   E. Coli Urosepsis - currently on levophed and ceftriaxone. Acute blood loss anemia - s/p hip surgery and hematoma.  Received 2 units PRBC's this admission. Pancytopenia - presumably due to chemotherapy but now with elevated WBC due to sepsis. Chronic combined systolic and diastolic CHF - has lower extremity edema.  Holding IVF's and diuretics for now and follow.  Permanent atrial fibrillation - rate controlled on amiodarone.  Was on Eliquis but held for surgery. DM - per primary S/p left femur fracture s/p IM nail.    Donetta Potts, MD [facility]  Patient will not be seen over the weekend and labs will be monitored remotely, however on call coverage is available if needed.

## 2022-06-25 NOTE — TOC Progression Note (Signed)
Transition of Care [facility]) - Progression Note    Patient Details  Name: Darius Smith MRN: [id number] Date of Birth: September 17, 1946  Transition of Care [facility] CM/SW Contact  Shade Flood, LCSW Phone Number: 06/25/2022, 11:24 AM  Clinical Narrative:     TOC following. MD anticipating possible dc Sunday. Updated Kerri at [facility]. Pt can come Sunday if he has insurance authorization and if he has a negative covid test within 24 hours of his dc.  Updated MD. Julienne Kass TOC will follow.  Expected Discharge Plan: [place] Barriers to Discharge: Continued Medical Work up  Expected Discharge Plan and Services Expected Discharge Plan: [place]                                               Social Determinants of Health (SDOH) Interventions Housing Interventions: Intervention Not Indicated  Readmission Risk Interventions    08/04/2020    4:53 PM 08/01/2020    9:34 AM  Readmission Risk Prevention Plan  Transportation Screening Complete Complete  HRI or Home Care Consult Complete Complete  Social Work Consult for [place] Planning/Counseling Complete Complete  Palliative Care Screening Complete Not Applicable  Medication Review [employer] photographer) Complete Complete

## 2022-06-25 NOTE — Progress Notes (Signed)
Virtual Visit via Video Note  I connected with [name] on @TODAY @ at  by a video enabled telemedicine application and verified that I am speaking with the correct person using two identifiers.  Location: Patient: Darius Smith ICU Bed 7 Provider: Elvina Sidle    Subjective: No new complaints   Antibiotics:  Anti-infectives (From admission, onward)    Start     Dose/Rate Route Frequency Ordered Stop   06/25/22 1800  ceFAZolin (ANCEF) IVPB 2g/100 mL premix        2 g 200 mL/hr over 30 Minutes Intravenous Every 12 hours 06/25/22 1526     06/23/22 1800  cefTRIAXone (ROCEPHIN) 2 g in sodium chloride 0.9 % 100 mL IVPB  Status:  Discontinued        2 g 200 mL/hr over 30 Minutes Intravenous Every 24 hours 06/23/22 1326 06/25/22 1512   06/22/22 1800  ceFEPIme (MAXIPIME) 2 g in sodium chloride 0.9 % 100 mL IVPB  Status:  Discontinued        2 g 200 mL/hr over 30 Minutes Intravenous Every 12 hours 06/22/22 1708 06/23/22 1325       Medications: Scheduled Meds:  amiodarone  200 mg Oral Daily   Chlorhexidine Gluconate Cloth  6 each Topical Daily   ferrous sulfate  325 mg Oral Q breakfast   polyethylene glycol  17 g Oral Daily   sodium bicarbonate  650 mg Oral BID   Continuous Infusions:  sodium chloride Stopped (06/24/22 0008)    ceFAZolin (ANCEF) IV     PRN Meds:.acetaminophen **OR** acetaminophen, albuterol, ondansetron **OR** ondansetron (ZOFRAN) IV, oxyCODONE    Objective: Weight change:   Intake/Output Summary (Last 24 hours) at 06/25/2022 1545 Last data filed at 06/25/2022 1300 Gross per 24 hour  Intake 432.6 ml  Output 650 ml  Net -217.4 ml   Blood pressure 128/86, pulse 95, temperature (!) 97.5 F (36.4 C), temperature source Oral, resp. rate 16, height 6' (1.829 m), weight 85.3 kg, SpO2 96 %. Temp:  [97.5 F (36.4 C)-98 F (36.7 C)] 97.5 F (36.4 C) (10/06 1300) Pulse Rate:  [41-113] 95 (10/06 1400) Resp:  [13-26] 16 (10/06 1400) BP:  (101-134)/(54-101) 128/86 (10/06 1400) SpO2:  [94 %-100 %] 96 % (10/06 1400) Weight:  [85.3 kg] 85.3 kg (10/06 0442)  Physical Exam: Physical Exam Constitutional:      Appearance: Normal appearance.  Cardiovascular:     Rate and Rhythm: Regular rhythm. Tachycardia present.  Pulmonary:     Effort: Pulmonary effort is normal. No respiratory distress.     Breath sounds: No wheezing.  Abdominal:     General: There is no distension.  Musculoskeletal:     Cervical back: Normal range of motion.  Neurological:     General: No focal deficit present.     Mental Status: He is alert and oriented to person, place, and time.  Psychiatric:        Mood and Affect: Mood normal.        Behavior: Behavior normal.        Thought Content: Thought content normal.        Judgment: Judgment normal.      CBC:    BMET Recent Labs    06/24/22 0303 06/25/22 0341  NA 138 136  K 4.2 4.3  CL 111 112*  CO2 16* 16*  GLUCOSE 126* 97  BUN 63* 77*  CREATININE 3.20* 3.36*  CALCIUM 7.2* 7.4*  Liver Panel  Recent Labs    06/24/22 0303 06/25/22 0341  PROT 4.2* 4.1*  ALBUMIN 1.6* 1.6*  AST 24 18  ALT 38 29  ALKPHOS 75 72  BILITOT 2.1* 1.3*       Sedimentation Rate No results for input(s): "ESRSEDRATE" in the last 72 hours. C-Reactive Protein No results for input(s): "CRP" in the last 72 hours.  Micro Results: Recent Results (from the past 720 hour(s))  Surgical PCR screen     Status: None   Collection Time: 06/10/22  8:31 PM   Specimen: Nasal Mucosa; Nasal Swab  Result Value Ref Range Status   MRSA, PCR NEGATIVE NEGATIVE Final   Staphylococcus aureus NEGATIVE NEGATIVE Final    Comment: (NOTE) The Xpert SA Assay (FDA approved for NASAL specimens in patients 19 years of age and older), is one component of a comprehensive surveillance program. It is not intended to diagnose infection nor to guide or monitor treatment. Performed at [facility], [address]  [place], [place] [address]   SARS Coronavirus 2 by RT PCR (hospital order, performed in [facility] hospital lab) *cepheid single result test* Anterior Nasal Swab     Status: None   Collection Time: 06/14/22 10:42 AM   Specimen: Anterior Nasal Swab  Result Value Ref Range Status   SARS Coronavirus 2 by RT PCR NEGATIVE NEGATIVE Final    Comment: (NOTE) SARS-CoV-2 target nucleic acids are NOT DETECTED.  The SARS-CoV-2 RNA is generally detectable in upper and lower respiratory specimens during the acute phase of infection. The lowest concentration of SARS-CoV-2 viral copies this assay can detect is 250 copies / mL. A negative result does not preclude SARS-CoV-2 infection and should not be used as the sole basis for treatment or other patient management decisions.  A negative result may occur with improper specimen collection / handling, submission of specimen other than nasopharyngeal swab, presence of viral mutation(s) within the areas targeted by this assay, and inadequate number of viral copies (<250 copies / mL). A negative result must be combined with clinical observations, patient history, and epidemiological information.  Fact Sheet for Patients:   https://www.patel.info/  Fact Sheet for Healthcare Providers: https://hall.com/  This test is not yet approved or  cleared by the [place] FDA and has been authorized for detection and/or diagnosis of SARS-CoV-2 by FDA under an Emergency Use Authorization (EUA).  This EUA will remain in effect (meaning this test can be used) for the duration of the COVID-19 declaration under Section 564(b)(1) of the Act, 21 U.S.C. section 360bbb-3(b)(1), unless the authorization is terminated or revoked sooner.  Performed at [facility], [address] [place], [place] [address]   SARS Coronavirus 2 by RT PCR (hospital order, performed in [facility] hospital lab) *cepheid single result test* Anterior  Nasal Swab     Status: None   Collection Time: 06/18/22  2:03 PM   Specimen: Anterior Nasal Swab  Result Value Ref Range Status   SARS Coronavirus 2 by RT PCR NEGATIVE NEGATIVE Final    Comment: (NOTE) SARS-CoV-2 target nucleic acids are NOT DETECTED.  The SARS-CoV-2 RNA is generally detectable in upper and lower respiratory specimens during the acute phase of infection. The lowest concentration of SARS-CoV-2 viral copies this assay can detect is 250 copies / mL. A negative result does not preclude SARS-CoV-2 infection and should not be used as the sole basis for treatment or other patient management decisions.  A negative result may occur with improper specimen collection /  handling, submission of specimen other than nasopharyngeal swab, presence of viral mutation(s) within the areas targeted by this assay, and inadequate number of viral copies (<250 copies / mL). A negative result must be combined with clinical observations, patient history, and epidemiological information.  Fact Sheet for Patients:   https://www.patel.info/  Fact Sheet for Healthcare Providers: https://hall.com/  This test is not yet approved or  cleared by the [place] FDA and has been authorized for detection and/or diagnosis of SARS-CoV-2 by FDA under an Emergency Use Authorization (EUA).  This EUA will remain in effect (meaning this test can be used) for the duration of the COVID-19 declaration under Section 564(b)(1) of the Act, 21 U.S.C. section 360bbb-3(b)(1), unless the authorization is terminated or revoked sooner.  Performed at [facility], [address], [place], [place] [address]   SARS Coronavirus 2 by RT PCR (hospital order, performed in [facility] hospital lab) *cepheid single result test* Anterior Nasal Swab     Status: None   Collection Time: 06/21/22  9:07 AM   Specimen: Anterior Nasal Swab  Result Value Ref Range Status   SARS Coronavirus 2  by RT PCR NEGATIVE NEGATIVE Final    Comment: (NOTE) SARS-CoV-2 target nucleic acids are NOT DETECTED.  The SARS-CoV-2 RNA is generally detectable in upper and lower respiratory specimens during the acute phase of infection. The lowest concentration of SARS-CoV-2 viral copies this assay can detect is 250 copies / mL. A negative result does not preclude SARS-CoV-2 infection and should not be used as the sole basis for treatment or other patient management decisions.  A negative result may occur with improper specimen collection / handling, submission of specimen other than nasopharyngeal swab, presence of viral mutation(s) within the areas targeted by this assay, and inadequate number of viral copies (<250 copies / mL). A negative result must be combined with clinical observations, patient history, and epidemiological information.  Fact Sheet for Patients:   https://www.patel.info/  Fact Sheet for Healthcare Providers: https://hall.com/  This test is not yet approved or  cleared by the [place] FDA and has been authorized for detection and/or diagnosis of SARS-CoV-2 by FDA under an Emergency Use Authorization (EUA).  This EUA will remain in effect (meaning this test can be used) for the duration of the COVID-19 declaration under Section 564(b)(1) of the Act, 21 U.S.C. section 360bbb-3(b)(1), unless the authorization is terminated or revoked sooner.  Performed at [facility], [address] [place], [place] [address]   Urine Culture     Status: Abnormal   Collection Time: 06/21/22  8:31 PM   Specimen: Urine, Clean Catch  Result Value Ref Range Status   Specimen Description   Final    URINE, CLEAN CATCH Performed at [facility], [address] [place], [place] [address]    Special Requests   Final    NONE Performed at [facility], [address] [place],  [address]    Culture >=100,000 COLONIES/mL ESCHERICHIA COLI (A)   Final   Report Status 06/24/2022 FINAL  Final   Organism ID, Bacteria ESCHERICHIA COLI (A)  Final      Susceptibility   Escherichia coli - MIC*    AMPICILLIN >=32 RESISTANT Resistant     CEFAZOLIN <=4 SENSITIVE Sensitive     CEFEPIME <=0.12 SENSITIVE Sensitive     CEFTRIAXONE <=0.25 SENSITIVE Sensitive     CIPROFLOXACIN <=0.25 SENSITIVE Sensitive     GENTAMICIN <=1 SENSITIVE Sensitive     IMIPENEM <=0.25 SENSITIVE Sensitive  NITROFURANTOIN <=16 SENSITIVE Sensitive     TRIMETH/SULFA <=20 SENSITIVE Sensitive     AMPICILLIN/SULBACTAM >=32 RESISTANT Resistant     PIP/TAZO <=4 SENSITIVE Sensitive     * >=100,000 COLONIES/mL ESCHERICHIA COLI  Culture, blood (Routine X 2) w Reflex to ID Panel     Status: Abnormal   Collection Time: 06/22/22  6:05 PM   Specimen: BLOOD RIGHT HAND  Result Value Ref Range Status   Specimen Description   Final    BLOOD RIGHT HAND Performed at [facility], [address] [place], [place] [address]    Special Requests   Final    BOTTLES DRAWN AEROBIC AND ANAEROBIC Blood Culture adequate volume Performed at [facility] [place], [place] [address]    Culture  Setup Time   Final    GRAM NEGATIVE RODS Gram Stain Report Called to,Read Back By and Verified With: HEARP N. AT [id number] ON [id number] BY THOMPSON S. IN BOTH AEROBIC AND ANAEROBIC BOTTLES CRITICAL RESULT CALLED TO, READ BACK BY AND VERIFIED WITH: PHARMD TINA TALBOTT 06/23/22 0957 BY AB Performed at [facility], [place] [address], [place], [place] [address]    Culture ESCHERICHIA COLI (A)  Final   Report Status 06/25/2022 FINAL  Final   Organism ID, Bacteria ESCHERICHIA COLI  Final      Susceptibility   Escherichia coli - MIC*    AMPICILLIN >=32 RESISTANT Resistant     CEFAZOLIN <=4 SENSITIVE Sensitive     CEFEPIME <=0.12 SENSITIVE Sensitive     CEFTAZIDIME <=1 SENSITIVE Sensitive     CEFTRIAXONE <=0.25 SENSITIVE Sensitive     CIPROFLOXACIN <=0.25 SENSITIVE Sensitive     GENTAMICIN  <=1 SENSITIVE Sensitive     IMIPENEM <=0.25 SENSITIVE Sensitive     TRIMETH/SULFA <=20 SENSITIVE Sensitive     AMPICILLIN/SULBACTAM 16 INTERMEDIATE Intermediate     PIP/TAZO <=4 SENSITIVE Sensitive     * ESCHERICHIA COLI  Culture, blood (Routine X 2) w Reflex to ID Panel     Status: Abnormal   Collection Time: 06/22/22  6:05 PM   Specimen: BLOOD RIGHT HAND  Result Value Ref Range Status   Specimen Description   Final    BLOOD RIGHT HAND Performed at [facility], [address], [place], [place] [address]    Special Requests   Final    BOTTLES DRAWN AEROBIC AND ANAEROBIC Blood Culture adequate volume Performed at [facility], [address], [place], [place] [address]    Culture  Setup Time   Final    GRAM NEGATIVE RODS Gram Stain Report Called to,Read Back By and Verified With: HEARP N. AT [id number] ON [id number] BY THOMPSON S. IN BOTH AEROBIC AND ANAEROBIC BOTTLES Performed at [facility], [address], [place], [place] [address]    Culture (A)  Final    ESCHERICHIA COLI SUSCEPTIBILITIES PERFORMED ON PREVIOUS CULTURE WITHIN THE LAST 5 DAYS. Performed at [facility], [place] [address] [place], [place] [address]    Report Status 06/25/2022 FINAL  Final  Blood Culture ID Panel (Reflexed)     Status: Abnormal   Collection Time: 06/22/22  6:05 PM  Result Value Ref Range Status   Enterococcus faecalis NOT DETECTED NOT DETECTED Final   Enterococcus Faecium NOT DETECTED NOT DETECTED Final   Listeria monocytogenes NOT DETECTED NOT DETECTED Final   Staphylococcus species NOT DETECTED NOT DETECTED Final   Staphylococcus aureus (BCID) NOT DETECTED NOT DETECTED Final   Staphylococcus epidermidis NOT DETECTED NOT  DETECTED Final   Staphylococcus lugdunensis NOT DETECTED NOT DETECTED Final   Streptococcus species NOT DETECTED NOT DETECTED Final   Streptococcus agalactiae NOT DETECTED NOT DETECTED Final   Streptococcus pneumoniae NOT DETECTED NOT DETECTED Final   Streptococcus pyogenes NOT  DETECTED NOT DETECTED Final   A.calcoaceticus-baumannii NOT DETECTED NOT DETECTED Final   Bacteroides fragilis NOT DETECTED NOT DETECTED Final   Enterobacterales DETECTED (A) NOT DETECTED Final    Comment: Enterobacterales represent a large order of gram negative bacteria, not a single organism. CRITICAL RESULT CALLED TO, READ BACK BY AND VERIFIED WITH: PHARMD T. TALBOTT 06/23/22 @0957  BY AB    Enterobacter cloacae complex NOT DETECTED NOT DETECTED Final   Escherichia coli DETECTED (A) NOT DETECTED Final    Comment: CRITICAL RESULT CALLED TO, READ BACK BY AND VERIFIED WITH: PHARMD T. TALBOTT 06/23/22 @0957  BY AB     Klebsiella aerogenes NOT DETECTED NOT DETECTED Final   Klebsiella oxytoca NOT DETECTED NOT DETECTED Final   Klebsiella pneumoniae NOT DETECTED NOT DETECTED Final   Proteus species NOT DETECTED NOT DETECTED Final   Salmonella species NOT DETECTED NOT DETECTED Final   Serratia marcescens NOT DETECTED NOT DETECTED Final   Haemophilus influenzae NOT DETECTED NOT DETECTED Final   Neisseria meningitidis NOT DETECTED NOT DETECTED Final   Pseudomonas aeruginosa NOT DETECTED NOT DETECTED Final   Stenotrophomonas maltophilia NOT DETECTED NOT DETECTED Final   Candida albicans NOT DETECTED NOT DETECTED Final   Candida auris NOT DETECTED NOT DETECTED Final   Candida glabrata NOT DETECTED NOT DETECTED Final   Candida krusei NOT DETECTED NOT DETECTED Final   Candida parapsilosis NOT DETECTED NOT DETECTED Final   Candida tropicalis NOT DETECTED NOT DETECTED Final   Cryptococcus neoformans/gattii NOT DETECTED NOT DETECTED Final   CTX-M ESBL NOT DETECTED NOT DETECTED Final   Carbapenem resistance IMP NOT DETECTED NOT DETECTED Final   Carbapenem resistance KPC NOT DETECTED NOT DETECTED Final   Carbapenem resistance NDM NOT DETECTED NOT DETECTED Final   Carbapenem resist OXA 48 LIKE NOT DETECTED NOT DETECTED Final   Carbapenem resistance VIM NOT DETECTED NOT DETECTED Final    Comment:  Performed at [facility], [address] [place], [place] [address]  Culture, blood (Routine X 2) w Reflex to ID Panel     Status: None (Preliminary result)   Collection Time: 06/23/22 11:05 AM   Specimen: Right Antecubital; Blood  Result Value Ref Range Status   Specimen Description RIGHT ANTECUBITAL  Final   Special Requests   Final    BOTTLES DRAWN AEROBIC AND ANAEROBIC Blood Culture results may not be optimal due to an excessive volume of blood received in culture bottles   Culture   Final    NO GROWTH 2 DAYS Performed at [facility], [address] [place], [place] [address]    Report Status PENDING  Incomplete  Culture, blood (Routine X 2) w Reflex to ID Panel     Status: None (Preliminary result)   Collection Time: 06/23/22 11:05 AM   Specimen: BLOOD RIGHT HAND  Result Value Ref Range Status   Specimen Description BLOOD RIGHT HAND  Final   Special Requests   Final    BOTTLES DRAWN AEROBIC AND ANAEROBIC Blood Culture adequate volume   Culture   Final    NO GROWTH 2 DAYS Performed at [facility], [address] [place], [place] [address]    Report Status PENDING  Incomplete  MRSA Next Gen by PCR, Nasal  Status: None   Collection Time: 06/24/22 12:29 AM   Specimen: Nasal Mucosa; Nasal Swab  Result Value Ref Range Status   MRSA by PCR Next Gen NOT DETECTED NOT DETECTED Final    Comment: (NOTE) The GeneXpert MRSA Assay (FDA approved for NASAL specimens only), is one component of a comprehensive MRSA colonization surveillance program. It is not intended to diagnose MRSA infection nor to guide or monitor treatment for MRSA infections. Test performance is not FDA approved in patients less than 52 years old. Performed at [facility], [address] [place], [place] [address]     Studies/Results: US RENAL  Result Date: 06/24/2022 CLINICAL DATA:  [id number] AKI (acute kidney injury) [facility]) [id number] EXAM: RENAL / URINARY TRACT ULTRASOUND COMPLETE COMPARISON:  June 02, 2021 FINDINGS: Right Kidney: Renal measurements: 8.4 x 4.3 x 4.4 cm = volume: 84 mL. Echogenicity is mildly increased. No mass or hydronephrosis visualized. Cortical thinning. This is similar in comparison to prior Left Kidney: Renal measurements: 13.1 x 5.8 x 7.2 cm = volume: 288 mL. Echogenicity within the upper limits of normal. No suspicious mass or hydronephrosis visualized. There is a benign 6 cm cyst noted (for which no dedicated imaging follow-up is recommended). Bladder: Subjectively thickened bladder walls with a trabeculated appearance. Other: None. IMPRESSION: 1. No hydronephrosis. 2. Bladder wall prominence with trabeculation. Findings could reflect sequela of chronic outlet obstruction or infection. Recommend correlation with urine analysis. Electronically Signed   By: Valentino Saxon M.D.   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 male with multiple medical problems including combined systolic diastolic heart failure atrial fibrillation non-Hodgkin's lymphoma on rituximab with recent hospital sustained left femur fracture status post ORIF now admitted with hypotension hypovolemia that responded to fluids but then became febrile and found to be bacteremic with E. coli from urinary source.  1.  E. coli urinary tract infection with bacteremia and sepsis requiring  pressors in the ICU:  Patient is improved dramatically after being on antibiotics.  E. coli is resistant to ampicillin and sulbactam but otherwise sensitive.  I am switching him to cefazolin.  As he improved he can be switched over to oral cefadroxil (renally dosed) to complete 7 days of total antibiotic therapy  I spent 52 minutes with the patient including than 50% of the time in face to face counseling of the patient re his complicated UTI with bacteremia and sepsis personally along with review of medical records in preparation for the visit and during the visit and in coordination of his care.  I will sign off for now  Please call with further questions.    LOS: 8 days   Darius Smith 06/25/2022, 3:45 PM

## 2022-06-25 NOTE — Progress Notes (Signed)
1 unit of platelets initiated at 1100 hrs even.

## 2022-06-25 NOTE — Progress Notes (Signed)
PROGRESS NOTE  Darius Smith JSH:[id number] DOB: 21-Mar-1946 DOA: 06/16/2022 PCP: [name], MD   LOS: 8 days   Brief Narrative / Interim history: 76 year old male with CKD 3B, COPD, chronic combined CHF, A-fib, Hodgkin's lymphoma on rituximab, reportedly in remission who comes into the hospital from [facility] after passing out.  Reports intermittent dizziness over the last few days, poor oral intake.  He was recently hospitalized and discharged 9/25 after left femur fracture status post ORIF by Dr. Amedeo Kinsman.  He was found to be hypotensive, admitted to the ICU and placed on pressors, and eventually blood cultures showed E. coli.  Hospital course complicated by AKI, anemia and thrombocytopenia status post unit of packed red blood cells and platelets  Subjective / 24h Interval events: He is doing much better this morning, no nausea or vomiting  Assesement and Plan: Principal Problem:   Hypotension Active Problems:   AKI (acute kidney injury) ([place])   Syncope   Closed comminuted intertrochanteric fracture of left femur (HCC)   Acute anemia   Essential hypertension   Atrial fibrillation (HCC)   Non Hodgkin's lymphoma (HCC)   Chronic combined systolic and diastolic CHF (congestive heart failure) (HCC)   Stage 3b chronic kidney disease ([place])   DNR (do not resuscitate) discussion   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 coli bacteremia, UTI-patient has been weaned off pressors since last night.  Blood cultures showing E. coli, ID consulted, appreciate input.  For now continue ceftriaxone -Shock seems to have resolved, blood pressure is stable.  Active problems Acute kidney injury on chronic kidney disease stage IIIb-baseline creatinine 1.5-1.8, currently creatinine is in the 3 range in the setting of shock.  Nephrology consulted and following, discussed  with Dr. Marval Regal today.  Creatinine overall seems to be plateauing, anticipate improvement moving forward.  Urine output is improving as well.  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  He received total of 2 units of packed red blood cells, after second unit he apparently started having chills and had a transfusion reaction.  Hemoglobin has improved some, 7.8 this morning.  Continue to monitor. -Platelets have dropped significantly at 12 K today, transfuse a unit of platelets.  B12, folate, LDH normal. -He has a component of acute blood loss anemia due to hematoma from recent left hip fracture surgery  Chronic combined systolic/diastolic -last echo with EF 50% G1 DD in 2022.  Continue to hold diuretics.  Does not appear overly fluid overloaded   Permanent A-fib-Rate controlled on amiodarone.  Eliquis held this admission due to patient's anemia. Dr.KC discussed with patient and his wife at the bedside regarding holding anticoagulation for now will need to be reassessed -Metoprolol discontinued with hypotension, unsafe for anticoagulation with severe thrombocytopenia   NHL w/ CNS involvement-last PET scan and MRI of the brain on 06/02/2022 showed complete response to therapy. Followed by heme oncology outpatient   Type 2 diabetes -A1c stable in 5,blood sugar well controlled.  Continue to monitor  CBG (last 3)  Recent Labs    06/23/22 0718 06/24/22 0440 06/25/22 0445  GLUCAP 152* 100* 89    History of comminuted intertrochanteric fracture of left femur ,s/p ORIF on 9/22-Wound looks clean but was suspected to have intramuscular hematoma.CT hip showed finding of small scattered hematoma  but negative for large hematoma or bleeding.  Eliquis remains on hold.  Plan is for skilled nursing facility   Constipation-continue bowel regimen   Scheduled Meds:  sodium chloride   Intravenous Once    amiodarone  200 mg Oral Daily   Chlorhexidine Gluconate Cloth  6 each Topical Daily   ferrous sulfate  325 mg Oral Q breakfast   polyethylene glycol  17 g Oral Daily   sodium bicarbonate  650 mg Oral BID   Continuous Infusions:  sodium chloride Stopped (06/24/22 0008)   cefTRIAXone (ROCEPHIN)  IV Stopped (06/24/22 1833)   norepinephrine (LEVOPHED) Adult infusion Stopped (06/24/22 1944)   PRN Meds:.acetaminophen **OR** acetaminophen, albuterol, ondansetron **OR** ondansetron (ZOFRAN) IV, oxyCODONE  Current Outpatient Medications  Medication Instructions   acetaminophen (TYLENOL) 1,000 mg, Oral, Every 6 hours PRN   amiodarone (PACERONE) 200 MG tablet TAKE 1 TABLET BY MOUTH ONCE A DAY.   amLODipine (NORVASC) 10 MG tablet TAKE 1 TABLET BY MOUTH ONCE DAILY.   apixaban (ELIQUIS) 5 MG TABS tablet TAKE ONE TABLET BY MOUTH 2 TIMES A DAY.   blood glucose meter kit and supplies KIT 1 each, Subcutaneous, 3 times daily before meals & bedtime, Dispense based on patient and insurance preference. Use up to four times daily as directed.   Continuous Blood Gluc Receiver (FREESTYLE LIBRE 2 READER) DEVI Use to monitor blood glucose continuously   Continuous Blood Gluc Sensor (FREESTYLE LIBRE 2 SENSOR) MISC USE TO MONITOR BLOOD SUGAR DAILY. (CHANGE EVERY 2WEEKS)   ferrous sulfate 325 mg, Oral, Daily with breakfast   furosemide (LASIX) 20 mg, Oral, Every other day, (May take an additional tab as needed.)   glucose blood (ONETOUCH ULTRA) test strip 1 each, Other, 4 times daily   hydrALAZINE (APRESOLINE) 100 mg, Oral, 2 times daily   insulin lispro (HUMALOG KWIKPEN) 1-10 Units, Subcutaneous, 3 times daily before meals & bedtime, Blood sugars 70-120: 1 unit 121-150: 2 units 151-200: 3 units 201-250: 4 units 251-300: 5 units 301-350: 6 units 351-400: 9 units More than 400, 10 units and call your doctor   Insulin Pen Needle 29G X 12MM MISC 1 each, Does not apply, 3 times daily before meals & bedtime   isosorbide  mononitrate (IMDUR) 30 MG 24 hr tablet TAKE 1/2 TABLET BY MOUTH ONCE DAILY.   Lancets (ONETOUCH DELICA PLUS CHENID78E) MISC 1 each, Other, 4 times daily   lenalidomide (REVLIMID) 10 mg, Oral, Take 10 mg on days 1-21 of a 28 day cycle.   omeprazole (PRILOSEC) 20 mg, Oral, Daily PRN   oxyCODONE (ROXICODONE) 5 mg, Oral, Every 8 hours PRN   potassium chloride SA (KLOR-CON M) 20 MEQ tablet 20 mEq, Oral, Daily   VENTOLIN HFA 108 (90 Base) MCG/ACT inhaler 2 puffs, Inhalation, Every 6 hours PRN    Diet Orders (From admission, onward)     Start     Ordered   06/16/22 1715  Diet regular Room service appropriate? Yes; Fluid consistency: Thin  Diet effective now       Question Answer Comment  Room service appropriate? Yes   Fluid consistency: Thin      06/16/22 1714            DVT prophylaxis: SCDs Start: 06/16/22 1737   Lab Results  Component Value Date   PLT 12 (LL) 06/25/2022      Code Status: DNR  Family Communication: No family at bedside  Status is: Inpatient  Remains inpatient appropriate because: severity of illness  Level  of care: ICU  Consultants:  Nephrology Oncology   Objective: Vitals:   06/25/22 0800 06/25/22 0811 06/25/22 0900 06/25/22 1000  BP: 116/60  (!) 117/101 105/88  Pulse: (!) 113 78 90 81  Resp: 17 14 (!) 21 (!) 26  Temp:      TempSrc:      SpO2: 95% 96% 98% 97%  Weight:      Height:        Intake/Output Summary (Last 24 hours) at 06/25/2022 1045 Last data filed at 06/25/2022 0824 Gross per 24 hour  Intake 163.59 ml  Output 650 ml  Net -486.41 ml   Wt Readings from Last 3 Encounters:  06/25/22 85.3 kg  06/09/22 81.3 kg  05/18/22 81.9 kg    Examination:  Constitutional: NAD Eyes: no scleral icterus ENMT: Mucous membranes are moist.  Neck: normal, supple Respiratory: clear to auscultation bilaterally, no wheezing, no crackles. Normal respiratory effort.  Cardiovascular: Regular rate and rhythm, no murmurs / rubs / gallops. No LE  edema.  Abdomen: non distended, no tenderness. Bowel sounds positive.  Musculoskeletal: no clubbing / cyanosis.  Skin: no rashes Neurologic: non focal   Data Reviewed: I have independently reviewed following labs and imaging studies   CBC Recent Labs  Lab 06/22/22 1545 06/22/22 1857 06/23/22 0028 06/24/22 0303 06/25/22 0341  WBC 0.6* 2.0* 5.3 11.6* 8.4  HGB 9.2* 8.4* 8.0* 7.4* 7.8*  HCT 28.5* 25.1* 24.6* 22.7* 23.3*  PLT 28* 35* 43* 24* 12*  MCV 98.3 94.0 96.5 95.8 92.8  MCH 31.7 31.5 31.4 31.2 31.1  MCHC 32.3 33.5 32.5 32.6 33.5  RDW 15.2 15.2 15.7* 15.9* 15.9*  LYMPHSABS  --  0.1*  --   --   --   MONOABS  --  0.1  --   --   --   EOSABS  --  0.0  --   --   --   BASOSABS  --  0.0  --   --   --     Recent Labs  Lab 06/20/22 0611 06/21/22 0704 06/22/22 0450 06/22/22 1545 06/22/22 1805 06/22/22 2005 06/23/22 0028 06/24/22 0303 06/25/22 0341  NA 134*   < > 139 136  --   --  139 138 136  K 3.7   < > 3.8 4.2  --   --  4.4 4.2 4.3  CL 110   < > 113* 113*  --   --  112* 111 112*  CO2 18*   < > 19* 15*  --   --  18* 16* 16*  GLUCOSE 107*   < > 101* 112*  --   --  166* 126* 97  BUN 43*   < > 39* 42*  --   --  44* 63* 77*  CREATININE 1.71*   < > 1.48* 1.91*  --   --  2.20* 3.20* 3.36*  CALCIUM 7.1*   < > 7.5* 7.5*  --   --  7.2* 7.2* 7.4*  AST  --   --   --  47*  --   --  44* 24 18  ALT  --   --   --  57*  --   --  53* 38 29  ALKPHOS  --   --   --  88  --   --  87 75 72  BILITOT  --   --   --  3.2*  --   --  3.9* 2.1* 1.3*  ALBUMIN  --   --   --  1.9*  --   --  1.7* 1.6* 1.6*  MG 1.9  --   --   --   --   --   --   --   --   LATICACIDVEN  --   --   --   --  1.9 1.8  --   --   --    < > = values in this interval not displayed.    ------------------------------------------------------------------------------------------------------------------ No results for input(s): "CHOL", "HDL", "LDLCALC", "TRIG", "CHOLHDL", "LDLDIRECT" in the last 72 hours.  Lab Results   Component Value Date   HGBA1C 5.0 04/14/2022   ------------------------------------------------------------------------------------------------------------------ No results for input(s): "TSH", "T4TOTAL", "T3FREE", "THYROIDAB" in the last 72 hours.  Invalid input(s): "FREET3"  Cardiac Enzymes No results for input(s): "CKMB", "TROPONINI", "MYOGLOBIN" in the last 168 hours.  Invalid input(s): "CK" ------------------------------------------------------------------------------------------------------------------    Component Value Date/Time   BNP 68.0 06/03/2021 1525    CBG: Recent Labs  Lab 06/22/22 0456 06/23/22 0545 06/23/22 0718 06/24/22 0440 06/25/22 0445  GLUCAP 95 161* 152* 100* 89    Recent Results (from the past 240 hour(s))  SARS Coronavirus 2 by RT PCR (hospital order, performed in [facility] hospital lab) *cepheid single result test* Anterior Nasal Swab     Status: None   Collection Time: 06/18/22  2:03 PM   Specimen: Anterior Nasal Swab  Result Value Ref Range Status   SARS Coronavirus 2 by RT PCR NEGATIVE NEGATIVE Final    Comment: (NOTE) SARS-CoV-2 target nucleic acids are NOT DETECTED.  The SARS-CoV-2 RNA is generally detectable in upper and lower respiratory specimens during the acute phase of infection. The lowest concentration of SARS-CoV-2 viral copies this assay can detect is 250 copies / mL. A negative result does not preclude SARS-CoV-2 infection and should not be used as the sole basis for treatment or other patient management decisions.  A negative result may occur with improper specimen collection / handling, submission of specimen other than nasopharyngeal swab, presence of viral mutation(s) within the areas targeted by this assay, and inadequate number of viral copies (<250 copies / mL). A negative result must be combined with clinical observations, patient history, and epidemiological information.  Fact Sheet for Patients:    https://www.patel.info/  Fact Sheet for Healthcare Providers: https://hall.com/  This test is not yet approved or  cleared by the [place] FDA and has been authorized for detection and/or diagnosis of SARS-CoV-2 by FDA under an Emergency Use Authorization (EUA).  This EUA will remain in effect (meaning this test can be used) for the duration of the COVID-19 declaration under Section 564(b)(1) of the Act, 21 U.S.C. section 360bbb-3(b)(1), unless the authorization is terminated or revoked sooner.  Performed at [facility], [address] [place], [place] [address]   SARS Coronavirus 2 by RT PCR (hospital order, performed in [facility] hospital lab) *cepheid single result test* Anterior Nasal Swab     Status: None   Collection Time: 06/21/22  9:07 AM   Specimen: Anterior Nasal Swab  Result Value Ref Range Status   SARS Coronavirus 2 by RT PCR NEGATIVE NEGATIVE Final    Comment: (NOTE) SARS-CoV-2 target nucleic acids are NOT DETECTED.  The SARS-CoV-2 RNA is generally detectable in upper and lower respiratory specimens during the acute phase of infection. The lowest concentration of SARS-CoV-2 viral copies this assay can detect is 250 copies / mL. A negative result does not preclude SARS-CoV-2 infection and should not be used as the sole basis for  treatment or other patient management decisions.  A negative result may occur with improper specimen collection / handling, submission of specimen other than nasopharyngeal swab, presence of viral mutation(s) within the areas targeted by this assay, and inadequate number of viral copies (<250 copies / mL). A negative result must be combined with clinical observations, patient history, and epidemiological information.  Fact Sheet for Patients:   https://www.patel.info/  Fact Sheet for Healthcare Providers: https://hall.com/  This test is not yet  approved or  cleared by the [place] FDA and has been authorized for detection and/or diagnosis of SARS-CoV-2 by FDA under an Emergency Use Authorization (EUA).  This EUA will remain in effect (meaning this test can be used) for the duration of the COVID-19 declaration under Section 564(b)(1) of the Act, 21 U.S.C. section 360bbb-3(b)(1), unless the authorization is terminated or revoked sooner.  Performed at [facility], [address], [place], [place] [address]   Urine Culture     Status: Abnormal   Collection Time: 06/21/22  8:31 PM   Specimen: Urine, Clean Catch  Result Value Ref Range Status   Specimen Description   Final    URINE, CLEAN CATCH Performed at [facility], [address], [place], [place] [address]    Special Requests   Final    NONE Performed at [facility], [address] [place], [place] [address]    Culture >=100,000 COLONIES/mL ESCHERICHIA COLI (A)  Final   Report Status 06/24/2022 FINAL  Final   Organism ID, Bacteria ESCHERICHIA COLI (A)  Final      Susceptibility   Escherichia coli - MIC*    AMPICILLIN >=32 RESISTANT Resistant     CEFAZOLIN <=4 SENSITIVE Sensitive     CEFEPIME <=0.12 SENSITIVE Sensitive     CEFTRIAXONE <=0.25 SENSITIVE Sensitive     CIPROFLOXACIN <=0.25 SENSITIVE Sensitive     GENTAMICIN <=1 SENSITIVE Sensitive     IMIPENEM <=0.25 SENSITIVE Sensitive     NITROFURANTOIN <=16 SENSITIVE Sensitive     TRIMETH/SULFA <=20 SENSITIVE Sensitive     AMPICILLIN/SULBACTAM >=32 RESISTANT Resistant     PIP/TAZO <=4 SENSITIVE Sensitive     * >=100,000 COLONIES/mL ESCHERICHIA COLI  Culture, blood (Routine X 2) w Reflex to ID Panel     Status: Abnormal   Collection Time: 06/22/22  6:05 PM   Specimen: BLOOD RIGHT HAND  Result Value Ref Range Status   Specimen Description   Final    BLOOD RIGHT HAND Performed at [facility], [address], [place], [place] [address]    Special Requests   Final    BOTTLES DRAWN AEROBIC AND ANAEROBIC Blood  Culture adequate volume Performed at [address] [place], [place] [address]    Culture  Setup Time   Final    GRAM NEGATIVE RODS Gram Stain Report Called to,Read Back By and Verified With: HEARP N. AT [id number] ON [id number] BY THOMPSON S. IN BOTH AEROBIC AND ANAEROBIC BOTTLES CRITICAL RESULT CALLED TO, READ BACK BY AND VERIFIED WITH: PHARMD TINA TALBOTT 06/23/22 0957 BY AB Performed at [facility], [place] [address] [place], [place] [address]    Culture ESCHERICHIA COLI (A)  Final   Report Status 06/25/2022 FINAL  Final   Organism ID, Bacteria ESCHERICHIA COLI  Final      Susceptibility   Escherichia coli - MIC*    AMPICILLIN >=32 RESISTANT Resistant     CEFAZOLIN <=4 SENSITIVE Sensitive     CEFEPIME <=0.12 SENSITIVE Sensitive  CEFTAZIDIME <=1 SENSITIVE Sensitive     CEFTRIAXONE <=0.25 SENSITIVE Sensitive     CIPROFLOXACIN <=0.25 SENSITIVE Sensitive     GENTAMICIN <=1 SENSITIVE Sensitive     IMIPENEM <=0.25 SENSITIVE Sensitive     TRIMETH/SULFA <=20 SENSITIVE Sensitive     AMPICILLIN/SULBACTAM 16 INTERMEDIATE Intermediate     PIP/TAZO <=4 SENSITIVE Sensitive     * ESCHERICHIA COLI  Culture, blood (Routine X 2) w Reflex to ID Panel     Status: Abnormal   Collection Time: 06/22/22  6:05 PM   Specimen: BLOOD RIGHT HAND  Result Value Ref Range Status   Specimen Description   Final    BLOOD RIGHT HAND Performed at [facility], [address] [place], [place] [address]    Special Requests   Final    BOTTLES DRAWN AEROBIC AND ANAEROBIC Blood Culture adequate volume Performed at [facility], [address] [place], [place] [address]    Culture  Setup Time   Final    GRAM NEGATIVE RODS Gram Stain Report Called to,Read Back By and Verified With: HEARP N. AT [id number] ON [id number] BY THOMPSON S. IN BOTH AEROBIC AND ANAEROBIC BOTTLES Performed at [facility], [address], [place], [place] [address]    Culture (A)  Final    ESCHERICHIA COLI SUSCEPTIBILITIES PERFORMED  ON PREVIOUS CULTURE WITHIN THE LAST 5 DAYS. Performed at [facility], [place] [address], [place], [place] [address]    Report Status 06/25/2022 FINAL  Final  Blood Culture ID Panel (Reflexed)     Status: Abnormal   Collection Time: 06/22/22  6:05 PM  Result Value Ref Range Status   Enterococcus faecalis NOT DETECTED NOT DETECTED Final   Enterococcus Faecium NOT DETECTED NOT DETECTED Final   Listeria monocytogenes NOT DETECTED NOT DETECTED Final   Staphylococcus species NOT DETECTED NOT DETECTED Final   Staphylococcus aureus (BCID) NOT DETECTED NOT DETECTED Final   Staphylococcus epidermidis NOT DETECTED NOT DETECTED Final   Staphylococcus lugdunensis NOT DETECTED NOT DETECTED Final   Streptococcus species NOT DETECTED NOT DETECTED Final   Streptococcus agalactiae NOT DETECTED NOT DETECTED Final   Streptococcus pneumoniae NOT DETECTED NOT DETECTED Final   Streptococcus pyogenes NOT DETECTED NOT DETECTED Final   A.calcoaceticus-baumannii NOT DETECTED NOT DETECTED Final   Bacteroides fragilis NOT DETECTED NOT DETECTED Final   Enterobacterales DETECTED (A) NOT DETECTED Final    Comment: Enterobacterales represent a large order of gram negative bacteria, not a single organism. CRITICAL RESULT CALLED TO, READ BACK BY AND VERIFIED WITH: PHARMD T. TALBOTT 06/23/22 '@0957'  BY AB    Enterobacter cloacae complex NOT DETECTED NOT DETECTED Final   Escherichia coli DETECTED (A) NOT DETECTED Final    Comment: CRITICAL RESULT CALLED TO, READ BACK BY AND VERIFIED WITH: PHARMD T. TALBOTT 06/23/22 '@0957'  BY AB     Klebsiella aerogenes NOT DETECTED NOT DETECTED Final   Klebsiella oxytoca NOT DETECTED NOT DETECTED Final   Klebsiella pneumoniae NOT DETECTED NOT DETECTED Final   Proteus species NOT DETECTED NOT DETECTED Final   Salmonella species NOT DETECTED NOT DETECTED Final   Serratia marcescens NOT DETECTED NOT DETECTED Final   Haemophilus influenzae NOT DETECTED NOT DETECTED Final   Neisseria  meningitidis NOT DETECTED NOT DETECTED Final   Pseudomonas aeruginosa NOT DETECTED NOT DETECTED Final   Stenotrophomonas maltophilia NOT DETECTED NOT DETECTED Final   Candida albicans NOT DETECTED NOT DETECTED Final   Candida auris NOT DETECTED NOT DETECTED Final   Candida glabrata NOT DETECTED NOT  DETECTED Final   Candida krusei NOT DETECTED NOT DETECTED Final   Candida parapsilosis NOT DETECTED NOT DETECTED Final   Candida tropicalis NOT DETECTED NOT DETECTED Final   Cryptococcus neoformans/gattii NOT DETECTED NOT DETECTED Final   CTX-M ESBL NOT DETECTED NOT DETECTED Final   Carbapenem resistance IMP NOT DETECTED NOT DETECTED Final   Carbapenem resistance KPC NOT DETECTED NOT DETECTED Final   Carbapenem resistance NDM NOT DETECTED NOT DETECTED Final   Carbapenem resist OXA 48 LIKE NOT DETECTED NOT DETECTED Final   Carbapenem resistance VIM NOT DETECTED NOT DETECTED Final    Comment: Performed at [facility], [place] [address] [place], [place] [address]  Culture, blood (Routine X 2) w Reflex to ID Panel     Status: None (Preliminary result)   Collection Time: 06/23/22 11:05 AM   Specimen: Right Antecubital; Blood  Result Value Ref Range Status   Specimen Description RIGHT ANTECUBITAL  Final   Special Requests   Final    BOTTLES DRAWN AEROBIC AND ANAEROBIC Blood Culture results may not be optimal due to an excessive volume of blood received in culture bottles   Culture   Final    NO GROWTH 2 DAYS Performed at [facility], [address], [place], [place] [address]    Report Status PENDING  Incomplete  Culture, blood (Routine X 2) w Reflex to ID Panel     Status: None (Preliminary result)   Collection Time: 06/23/22 11:05 AM   Specimen: BLOOD RIGHT HAND  Result Value Ref Range Status   Specimen Description BLOOD RIGHT HAND  Final   Special Requests   Final    BOTTLES DRAWN AEROBIC AND ANAEROBIC Blood Culture adequate volume   Culture   Final    NO GROWTH 2 DAYS Performed at  [facility], [address], [place], [place] [address]    Report Status PENDING  Incomplete  MRSA Next Gen by PCR, Nasal     Status: None   Collection Time: 06/24/22 12:29 AM   Specimen: Nasal Mucosa; Nasal Swab  Result Value Ref Range Status   MRSA by PCR Next Gen NOT DETECTED NOT DETECTED Final    Comment: (NOTE) The GeneXpert MRSA Assay (FDA approved for NASAL specimens only), is one component of a comprehensive MRSA colonization surveillance program. It is not intended to diagnose MRSA infection nor to guide or monitor treatment for MRSA infections. Test performance is not FDA approved in patients less than 16 years old. Performed at [facility], [address], [place], [place] [address]      Radiology Studies: US RENAL  Result Date: 06/24/2022 CLINICAL DATA:  [id number] AKI (acute kidney injury) [facility]) [id number] EXAM: RENAL / URINARY TRACT ULTRASOUND COMPLETE COMPARISON:  June 02, 2021 FINDINGS: Right Kidney: Renal measurements: 8.4 x 4.3 x 4.4 cm = volume: 84 mL. Echogenicity is mildly increased. No mass or hydronephrosis visualized. Cortical thinning. This is similar in comparison to prior Left Kidney: Renal measurements: 13.1 x 5.8 x 7.2 cm = volume: 288 mL. Echogenicity within the upper limits of normal. No suspicious mass or hydronephrosis visualized. There is a benign 6 cm cyst noted (for which no dedicated imaging follow-up is recommended). Bladder: Subjectively thickened bladder walls with a trabeculated appearance. Other: None. IMPRESSION: 1. No hydronephrosis. 2. Bladder wall prominence with trabeculation. Findings could reflect sequela of chronic outlet obstruction or infection. Recommend correlation with urine analysis. Electronically Signed   By: Valentino Saxon M.D.   On: 06/24/2022 12:48  Marzetta Board, MD, PhD [facility]  Between 7 am - 7 pm I am available, please contact me via Amion (for emergencies) or Securechat (non urgent messages)  Between 7 pm -  7 am I am not available, please contact night coverage MD/APP via Amion

## 2022-06-25 NOTE — Plan of Care (Signed)
Patient remains in CCU. No supplemental O2 requirement. No infusions. Platelet count = 12 this AM; plan for platelet transfusion today.  Problem: Education: Goal: Knowledge of General Education information will improve Description: Including pain rating scale, medication(s)/side effects and non-pharmacologic comfort measures 06/25/2022 0833 by Jesse Sans, RN Outcome: Progressing 06/25/2022 0833 by Jesse Sans, RN Outcome: Progressing   Problem: Health Behavior/Discharge Planning: Goal: Ability to manage health-related needs will improve 06/25/2022 0833 by Jesse Sans, RN Outcome: Progressing 06/25/2022 0833 by Jesse Sans, RN Outcome: Progressing   Problem: Clinical Measurements: Goal: Ability to maintain clinical measurements within normal limits will improve 06/25/2022 0833 by Jesse Sans, RN Outcome: Progressing 06/25/2022 0833 by Jesse Sans, RN Outcome: Progressing Goal: Will remain free from infection 06/25/2022 0833 by Jesse Sans, RN Outcome: Progressing 06/25/2022 0833 by Jesse Sans, RN Outcome: Progressing Goal: Diagnostic test results will improve 06/25/2022 0833 by Jesse Sans, RN Outcome: Progressing 06/25/2022 0833 by Jesse Sans, RN Outcome: Progressing Goal: Respiratory complications will improve 06/25/2022 0833 by Jesse Sans, RN Outcome: Progressing 06/25/2022 0833 by Jesse Sans, RN Outcome: Progressing Goal: Cardiovascular complication will be avoided 06/25/2022 8811 by Jesse Sans, RN Outcome: Progressing 06/25/2022 0833 by Jesse Sans, RN Outcome: Progressing   Problem: Activity: Goal: Risk for activity intolerance will decrease 06/25/2022 0833 by Jesse Sans, RN Outcome: Progressing 06/25/2022 0833 by Jesse Sans, RN Outcome: Progressing   Problem: Nutrition: Goal: Adequate nutrition will be maintained 06/25/2022 0833 by Jesse Sans, RN Outcome: Progressing 06/25/2022 0833 by Jesse Sans, RN Outcome: Progressing   Problem: Coping: Goal: Level of anxiety will decrease 06/25/2022 0833 by Jesse Sans, RN Outcome: Progressing 06/25/2022 0833 by Jesse Sans, RN Outcome: Progressing   Problem: Elimination: Goal: Will not experience complications related to bowel motility 06/25/2022 0833 by Jesse Sans, RN Outcome: Progressing 06/25/2022 0833 by Jesse Sans, RN Outcome: Progressing Goal: Will not experience complications related to urinary retention 06/25/2022 0833 by Jesse Sans, RN Outcome: Progressing 06/25/2022 0833 by Jesse Sans, RN Outcome: Progressing   Problem: Pain Managment: Goal: General experience of comfort will improve 06/25/2022 0833 by Jesse Sans, RN Outcome: Progressing 06/25/2022 0833 by Jesse Sans, RN Outcome: Progressing   Problem: Safety: Goal: Ability to remain free from injury will improve 06/25/2022 0833 by Jesse Sans, RN Outcome: Progressing 06/25/2022 0833 by Jesse Sans, RN Outcome: Progressing   Problem: Skin Integrity: Goal: Risk for impaired skin integrity will decrease 06/25/2022 0833 by Jesse Sans, RN Outcome: Progressing 06/25/2022 0833 by Jesse Sans, RN Outcome: Progressing

## 2022-06-26 DIAGNOSIS — E861 Hypovolemia: Secondary | ICD-10-CM | POA: Diagnosis not present

## 2022-06-26 DIAGNOSIS — I9589 Other hypotension: Secondary | ICD-10-CM | POA: Diagnosis not present

## 2022-06-26 LAB — COMPREHENSIVE METABOLIC PANEL
ALT: 21 U/L (ref 0–44)
AST: 12 U/L — ABNORMAL LOW (ref 15–41)
Albumin: 1.8 g/dL — ABNORMAL LOW (ref 3.5–5.0)
Alkaline Phosphatase: 71 U/L (ref 38–126)
Anion gap: 11 (ref 5–15)
BUN: 88 mg/dL — ABNORMAL HIGH (ref 8–23)
CO2: 17 mmol/L — ABNORMAL LOW (ref 22–32)
Calcium: 7.5 mg/dL — ABNORMAL LOW (ref 8.9–10.3)
Chloride: 107 mmol/L (ref 98–111)
Creatinine, Ser: 4.06 mg/dL — ABNORMAL HIGH (ref 0.61–1.24)
GFR, Estimated: 15 mL/min — ABNORMAL LOW (ref >60)
Glucose, Bld: 100 mg/dL — ABNORMAL HIGH (ref 70–99)
Potassium: 4.6 mmol/L (ref 3.5–5.1)
Sodium: 135 mmol/L (ref 135–145)
Total Bilirubin: 1.2 mg/dL (ref 0.3–1.2)
Total Protein: 4.3 g/dL — ABNORMAL LOW (ref 6.5–8.1)

## 2022-06-26 LAB — MAGNESIUM: Magnesium: 2.1 mg/dL (ref 1.7–2.4)

## 2022-06-26 LAB — PREPARE PLATELET PHERESIS: Unit division: 0

## 2022-06-26 LAB — GLUCOSE, CAPILLARY
Glucose-Capillary: 108 mg/dL — ABNORMAL HIGH (ref 70–99)
Glucose-Capillary: 89 mg/dL (ref 70–99)
Glucose-Capillary: 107 mg/dL — ABNORMAL HIGH (ref 70–99)
Glucose-Capillary: 109 mg/dL — ABNORMAL HIGH (ref 70–99)

## 2022-06-26 LAB — BPAM PLATELET PHERESIS
Blood Product Expiration Date: 202310062359
ISSUE DATE / TIME: 202310061044
Unit Type and Rh: 6200

## 2022-06-26 LAB — CBC
HCT: 25.1 % — ABNORMAL LOW (ref 39.0–52.0)
Hemoglobin: 8.5 g/dL — ABNORMAL LOW (ref 13.0–17.0)
MCH: 31.6 pg (ref 26.0–34.0)
MCHC: 33.9 g/dL (ref 30.0–36.0)
MCV: 93.3 fL (ref 80.0–100.0)
Platelets: 11 10*3/uL — CL (ref 150–400)
RBC: 2.69 MIL/uL — ABNORMAL LOW (ref 4.22–5.81)
RDW: 15.9 % — ABNORMAL HIGH (ref 11.5–15.5)
WBC: 4.5 10*3/uL (ref 4.0–10.5)
nRBC: 0 % (ref 0.0–0.2)

## 2022-06-26 MED ORDER — SODIUM CHLORIDE 0.9% IV SOLUTION: Freq: Once | INTRAVENOUS | Status: AC

## 2022-06-26 NOTE — Progress Notes (Addendum)
Pt is authorized for [place] admission on 06/27/2022.  Darius Smith, MSW, LCSW-A Pronouns:  She/Her/Hers [facility] HealthTransitions of Care Clinical Social Worker Direct Number:  [phone] [email or web address]

## 2022-06-26 NOTE — Progress Notes (Signed)
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  Reports intermittent dizziness over the last few days, poor oral intake.  He was recently hospitalized and discharged 9/25 after left femur fracture status post ORIF by Dr. Amedeo Kinsman.  He was found to be hypotensive, admitted to the ICU and placed on pressors, and eventually blood cultures showed E. coli.  Hospital course complicated by AKI, anemia and thrombocytopenia status post unit of packed red blood cells and platelets.  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 coli bacteremia, UTI-patient has been weaned off pressors since last night.  Blood cultures showing E. coli, ID consulted, appreciate input.  For now continue ceftriaxone -Shock seems to have resolved, blood pressure is stable. -Now on Ancef per ID day 5/7   Active problems Acute kidney injury on chronic kidney disease stage IIIb-baseline creatinine 1.5-1.8, currently creatinine is in the 3 range in the setting of shock.   Nephrology consulted and following, discussed with Dr. Marval Regal today.  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  He received total of 2 units of packed red blood cells, after second unit he apparently started having chills and had a transfusion reaction.  Hemoglobin has improved some, 7.8 this morning.  Continue to monitor. -Platelets have dropped significantly at 11 K today, transfuse 2 units of platelets.  B12, folate, LDH normal. -He has a component of acute blood loss anemia due to hematoma from recent left hip fracture surgery   Chronic combined systolic/diastolic -last echo with EF 50% G1 DD in 2022.  Continue to hold diuretics.  Does not appear overly fluid overloaded   Permanent A-fib-Rate controlled on amiodarone.  Eliquis held this admission due to patient's anemia. Dr.KC discussed with patient and his wife at the bedside regarding holding anticoagulation for now will need to be reassessed -Metoprolol discontinued with hypotension, unsafe for anticoagulation with severe thrombocytopenia   NHL w/ CNS involvement-last PET scan and MRI of the brain on 06/02/2022 showed complete response to therapy. Followed by heme oncology outpatient   Type 2 diabetes -A1c stable in 5,blood sugar well controlled.  Continue to monitor     History of comminuted intertrochanteric fracture of left femur ,s/p ORIF on 9/22-Wound looks clean but was suspected to have intramuscular hematoma.CT hip showed finding of small scattered hematoma but negative for  large hematoma or bleeding.  Eliquis remains on hold.  Plan is for skilled nursing facility   Constipation-continue bowel regimen  Overall worsening prognosis. Consult to Palliative Care.   DVT prophylaxis: SCDs Code Status: DNR Family  Communication: Discussed with wife on phone 10/7 Disposition Plan: Overall poor prognosis Status is: Inpatient Remains inpatient appropriate because: IV medications/transfusions  Consultants:  Nephrology Hematology ID Palliative Care  Procedures:  None  Antimicrobials:  Anti-infectives (From admission, onward)    Start     Dose/Rate Route Frequency Ordered Stop   06/25/22 1800  ceFAZolin (ANCEF) IVPB 2g/100 mL premix        2 g 200 mL/hr over 30 Minutes Intravenous Every 12 hours 06/25/22 1526     06/23/22 1800  cefTRIAXone (ROCEPHIN) 2 g in sodium chloride 0.9 % 100 mL IVPB  Status:  Discontinued        2 g 200 mL/hr over 30 Minutes Intravenous Every 24 hours 06/23/22 1326 06/25/22 1512   06/22/22 1800  ceFEPIme (MAXIPIME) 2 g in sodium chloride 0.9 % 100 mL IVPB  Status:  Discontinued        2 g 200 mL/hr over 30 Minutes Intravenous Every 12 hours 06/22/22 1708 06/23/22 1325      Subjective: Patient seen and evaluated today with increasing somnolence. No acute overnight events.  Objective: Vitals:   06/25/22 1949 06/25/22 2357 06/26/22 0418 06/26/22 1013  BP: 109/73 101/72 97/74 116/87  Pulse: 83 100 100 61  Resp: 20 18 18    Temp: 97.8 F (36.6 C) 97.6 F (36.4 C) 97.7 F (36.5 C)   TempSrc: Oral Oral Oral   SpO2: 99% 98% 100%   Weight:      Height:        Intake/Output Summary (Last 24 hours) at 06/26/2022 1129 Last data filed at 06/26/2022 6213 Gross per 24 hour  Intake 493.4 ml  Output 300 ml  Net 193.4 ml   Filed Weights   06/16/22 1253 06/16/22 1645 06/25/22 0442  Weight: 78.5 kg 80.3 kg 85.3 kg    Examination:  General exam: Appears calm and comfortable  Respiratory system: Clear to auscultation. Respiratory effort normal. Cardiovascular system: S1 & S2 heard, RRR.  Gastrointestinal system: Abdomen is soft Central nervous system: Alert and awake Extremities: No edema Skin: No significant lesions noted Psychiatry: Flat affect.    Data  Reviewed: I have personally reviewed following labs and imaging studies  CBC: Recent Labs  Lab 06/22/22 1857 06/23/22 0028 06/24/22 0303 06/25/22 0341 06/26/22 0900  WBC 2.0* 5.3 11.6* 8.4 4.5  NEUTROABS 1.8  --   --   --   --   HGB 8.4* 8.0* 7.4* 7.8* 8.5*  HCT 25.1* 24.6* 22.7* 23.3* 25.1*  MCV 94.0 96.5 95.8 92.8 93.3  PLT 35* 43* 24* 12* 11*   Basic Metabolic Panel: Recent Labs  Lab 06/20/22 0611 06/21/22 0704 06/22/22 1545 06/23/22 0028 06/24/22 0303 06/25/22 0341 06/26/22 0900  NA 134*   < > 136 139 138 136 135  K 3.7   < > 4.2 4.4 4.2 4.3 4.6  CL 110   < > 113* 112* 111 112* 107  CO2 18*   < > 15* 18* 16* 16* 17*  GLUCOSE 107*   < > 112* 166* 126* 97 100*  BUN 43*   < > 42* 44* 63* 77* 88*  CREATININE 1.71*   < > 1.91* 2.20* 3.20* 3.36* 4.06*  CALCIUM 7.1*   < > 7.5* 7.2* 7.2*  7.4* 7.5*  MG 1.9  --   --   --   --   --  2.1   < > = values in this interval not displayed.   GFR: Estimated Creatinine Clearance: 17 mL/min (A) (by C-G formula based on SCr of 4.06 mg/dL (H)). Liver Function Tests: Recent Labs  Lab 06/22/22 1545 06/23/22 0028 06/24/22 0303 06/25/22 0341 06/26/22 0900  AST 47* 44* 24 18 12*  ALT 57* 53* 38 29 21  ALKPHOS 88 87 75 72 71  BILITOT 3.2* 3.9* 2.1* 1.3* 1.2  PROT 4.6* 4.1* 4.2* 4.1* 4.3*  ALBUMIN 1.9* 1.7* 1.6* 1.6* 1.8*   No results for input(s): "LIPASE", "AMYLASE" in the last 168 hours. No results for input(s): "AMMONIA" in the last 168 hours. Coagulation Profile: No results for input(s): "INR", "PROTIME" in the last 168 hours. Cardiac Enzymes: No results for input(s): "CKTOTAL", "CKMB", "CKMBINDEX", "TROPONINI" in the last 168 hours. BNP (last 3 results) No results for input(s): "PROBNP" in the last 8760 hours. HbA1C: No results for input(s): "HGBA1C" in the last 72 hours. CBG: Recent Labs  Lab 06/23/22 0718 06/24/22 0440 06/25/22 0445 06/26/22 0458 06/26/22 1106  GLUCAP 152* 100* 89 108* 107*   Lipid  Profile: No results for input(s): "CHOL", "HDL", "LDLCALC", "TRIG", "CHOLHDL", "LDLDIRECT" in the last 72 hours. Thyroid Function Tests: No results for input(s): "TSH", "T4TOTAL", "FREET4", "T3FREE", "THYROIDAB" in the last 72 hours. Anemia Panel: No results for input(s): "VITAMINB12", "FOLATE", "FERRITIN", "TIBC", "IRON", "RETICCTPCT" in the last 72 hours. Sepsis Labs: Recent Labs  Lab 06/22/22 1805 06/22/22 2005  LATICACIDVEN 1.9 1.8    Recent Results (from the past 240 hour(s))  SARS Coronavirus 2 by RT PCR (hospital order, performed in [facility] hospital lab) *cepheid single result test* Anterior Nasal Swab     Status: None   Collection Time: 06/18/22  2:03 PM   Specimen: Anterior Nasal Swab  Result Value Ref Range Status   SARS Coronavirus 2 by RT PCR NEGATIVE NEGATIVE Final    Comment: (NOTE) SARS-CoV-2 target nucleic acids are NOT DETECTED.  The SARS-CoV-2 RNA is generally detectable in upper and lower respiratory specimens during the acute phase of infection. The lowest concentration of SARS-CoV-2 viral copies this assay can detect is 250 copies / mL. A negative result does not preclude SARS-CoV-2 infection and should not be used as the sole basis for treatment or other patient management decisions.  A negative result may occur with improper specimen collection / handling, submission of specimen other than nasopharyngeal swab, presence of viral mutation(s) within the areas targeted by this assay, and inadequate number of viral copies (<250 copies / mL). A negative result must be combined with clinical observations, patient history, and epidemiological information.  Fact Sheet for Patients:   https://www.patel.info/  Fact Sheet for Healthcare Providers: https://hall.com/  This test is not yet approved or  cleared by the [place] FDA and has been authorized for detection and/or diagnosis of SARS-CoV-2 by FDA under an  Emergency Use Authorization (EUA).  This EUA will remain in effect (meaning this test can be used) for the duration of the COVID-19 declaration under Section 564(b)(1) of the Act, 21 U.S.C. section 360bbb-3(b)(1), unless the authorization is terminated or revoked sooner.  Performed at [facility], [address], [place], [place] [address]   SARS Coronavirus 2 by RT PCR (hospital order, performed in [facility] hospital lab) *cepheid single result test* Anterior Nasal Swab     Status: None  Collection Time: 06/21/22  9:07 AM   Specimen: Anterior Nasal Swab  Result Value Ref Range Status   SARS Coronavirus 2 by RT PCR NEGATIVE NEGATIVE Final    Comment: (NOTE) SARS-CoV-2 target nucleic acids are NOT DETECTED.  The SARS-CoV-2 RNA is generally detectable in upper and lower respiratory specimens during the acute phase of infection. The lowest concentration of SARS-CoV-2 viral copies this assay can detect is 250 copies / mL. A negative result does not preclude SARS-CoV-2 infection and should not be used as the sole basis for treatment or other patient management decisions.  A negative result may occur with improper specimen collection / handling, submission of specimen other than nasopharyngeal swab, presence of viral mutation(s) within the areas targeted by this assay, and inadequate number of viral copies (<250 copies / mL). A negative result must be combined with clinical observations, patient history, and epidemiological information.  Fact Sheet for Patients:   https://www.patel.info/  Fact Sheet for Healthcare Providers: https://hall.com/  This test is not yet approved or  cleared by the [place] FDA and has been authorized for detection and/or diagnosis of SARS-CoV-2 by FDA under an Emergency Use Authorization (EUA).  This EUA will remain in effect (meaning this test can be used) for the duration of the COVID-19 declaration under  Section 564(b)(1) of the Act, 21 U.S.C. section 360bbb-3(b)(1), unless the authorization is terminated or revoked sooner.  Performed at [facility], [address], [place], [place] [address]   Urine Culture     Status: Abnormal   Collection Time: 06/21/22  8:31 PM   Specimen: Urine, Clean Catch  Result Value Ref Range Status   Specimen Description   Final    URINE, CLEAN CATCH Performed at [facility], [address] [place], [place] [address]    Special Requests   Final    NONE Performed at [facility], [address], [place], [place] [address]    Culture >=100,000 COLONIES/mL ESCHERICHIA COLI (A)  Final   Report Status 06/24/2022 FINAL  Final   Organism ID, Bacteria ESCHERICHIA COLI (A)  Final      Susceptibility   Escherichia coli - MIC*    AMPICILLIN >=32 RESISTANT Resistant     CEFAZOLIN <=4 SENSITIVE Sensitive     CEFEPIME <=0.12 SENSITIVE Sensitive     CEFTRIAXONE <=0.25 SENSITIVE Sensitive     CIPROFLOXACIN <=0.25 SENSITIVE Sensitive     GENTAMICIN <=1 SENSITIVE Sensitive     IMIPENEM <=0.25 SENSITIVE Sensitive     NITROFURANTOIN <=16 SENSITIVE Sensitive     TRIMETH/SULFA <=20 SENSITIVE Sensitive     AMPICILLIN/SULBACTAM >=32 RESISTANT Resistant     PIP/TAZO <=4 SENSITIVE Sensitive     * >=100,000 COLONIES/mL ESCHERICHIA COLI  Culture, blood (Routine X 2) w Reflex to ID Panel     Status: Abnormal   Collection Time: 06/22/22  6:05 PM   Specimen: BLOOD RIGHT HAND  Result Value Ref Range Status   Specimen Description   Final    BLOOD RIGHT HAND Performed at [facility], [address], [place], [place] [address]    Special Requests   Final    BOTTLES DRAWN AEROBIC AND ANAEROBIC Blood Culture adequate volume Performed at [facility] [place], [place] [address]    Culture  Setup Time   Final    GRAM NEGATIVE RODS Gram Stain Report Called to,Read Back By and Verified With: HEARP N. AT [id number] ON [id number] BY THOMPSON S. IN BOTH AEROBIC AND ANAEROBIC  BOTTLES CRITICAL RESULT CALLED TO, READ BACK BY AND VERIFIED WITH: PHARMD TINA TALBOTT 06/23/22 0957 BY AB Performed at [facility], [place] [address], [place], [place] [address]    Culture ESCHERICHIA COLI (A)  Final   Report Status 06/25/2022 FINAL  Final   Organism ID, Bacteria ESCHERICHIA COLI  Final      Susceptibility   Escherichia coli - MIC*    AMPICILLIN >=32 RESISTANT Resistant     CEFAZOLIN <=4 SENSITIVE Sensitive     CEFEPIME <=0.12 SENSITIVE Sensitive     CEFTAZIDIME <=1 SENSITIVE Sensitive     CEFTRIAXONE <=0.25 SENSITIVE Sensitive     CIPROFLOXACIN <=0.25 SENSITIVE Sensitive     GENTAMICIN <=1 SENSITIVE Sensitive     IMIPENEM <=0.25 SENSITIVE Sensitive     TRIMETH/SULFA <=20 SENSITIVE Sensitive     AMPICILLIN/SULBACTAM 16 INTERMEDIATE Intermediate     PIP/TAZO <=4 SENSITIVE Sensitive     * ESCHERICHIA COLI  Culture, blood (Routine X 2) w Reflex to ID Panel     Status: Abnormal   Collection Time: 06/22/22  6:05 PM   Specimen: BLOOD RIGHT HAND  Result Value Ref Range Status   Specimen Description   Final    BLOOD RIGHT HAND Performed at [facility], [address], [place], [place] [address]    Special Requests   Final    BOTTLES DRAWN AEROBIC AND ANAEROBIC Blood Culture adequate volume Performed at [facility], [address], [place], [place] [address]    Culture  Setup Time   Final    GRAM NEGATIVE RODS Gram Stain Report Called to,Read Back By and Verified With: HEARP N. AT [id number] ON [id number] BY THOMPSON S. IN BOTH AEROBIC AND ANAEROBIC BOTTLES Performed at [facility], [address], [place], [place] [address]    Culture (A)  Final    ESCHERICHIA COLI SUSCEPTIBILITIES PERFORMED ON PREVIOUS CULTURE WITHIN THE LAST 5 DAYS. Performed at [facility], [place] [address], [place], [place] [address]    Report Status 06/25/2022 FINAL  Final  Blood Culture ID Panel (Reflexed)     Status: Abnormal   Collection Time: 06/22/22  6:05 PM  Result Value Ref Range  Status   Enterococcus faecalis NOT DETECTED NOT DETECTED Final   Enterococcus Faecium NOT DETECTED NOT DETECTED Final   Listeria monocytogenes NOT DETECTED NOT DETECTED Final   Staphylococcus species NOT DETECTED NOT DETECTED Final   Staphylococcus aureus (BCID) NOT DETECTED NOT DETECTED Final   Staphylococcus epidermidis NOT DETECTED NOT DETECTED Final   Staphylococcus lugdunensis NOT DETECTED NOT DETECTED Final   Streptococcus species NOT DETECTED NOT DETECTED Final   Streptococcus agalactiae NOT DETECTED NOT DETECTED Final   Streptococcus pneumoniae NOT DETECTED NOT DETECTED Final   Streptococcus pyogenes NOT DETECTED NOT DETECTED Final   A.calcoaceticus-baumannii NOT DETECTED NOT DETECTED Final   Bacteroides fragilis NOT DETECTED NOT DETECTED Final   Enterobacterales DETECTED (A) NOT DETECTED Final    Comment: Enterobacterales represent a large order of gram negative bacteria, not a single organism. CRITICAL RESULT CALLED TO, READ BACK BY AND VERIFIED WITH: PHARMD T. TALBOTT 06/23/22 @0957  BY AB    Enterobacter cloacae complex NOT DETECTED NOT DETECTED Final   Escherichia coli DETECTED (A) NOT DETECTED Final    Comment: CRITICAL RESULT CALLED TO, READ BACK BY AND VERIFIED WITH: PHARMD T. TALBOTT 06/23/22 @0957  BY AB     Klebsiella aerogenes NOT DETECTED NOT DETECTED Final   Klebsiella oxytoca NOT DETECTED NOT DETECTED Final  Klebsiella pneumoniae NOT DETECTED NOT DETECTED Final   Proteus species NOT DETECTED NOT DETECTED Final   Salmonella species NOT DETECTED NOT DETECTED Final   Serratia marcescens NOT DETECTED NOT DETECTED Final   Haemophilus influenzae NOT DETECTED NOT DETECTED Final   Neisseria meningitidis NOT DETECTED NOT DETECTED Final   Pseudomonas aeruginosa NOT DETECTED NOT DETECTED Final   Stenotrophomonas maltophilia NOT DETECTED NOT DETECTED Final   Candida albicans NOT DETECTED NOT DETECTED Final   Candida auris NOT DETECTED NOT DETECTED Final   Candida glabrata  NOT DETECTED NOT DETECTED Final   Candida krusei NOT DETECTED NOT DETECTED Final   Candida parapsilosis NOT DETECTED NOT DETECTED Final   Candida tropicalis NOT DETECTED NOT DETECTED Final   Cryptococcus neoformans/gattii NOT DETECTED NOT DETECTED Final   CTX-M ESBL NOT DETECTED NOT DETECTED Final   Carbapenem resistance IMP NOT DETECTED NOT DETECTED Final   Carbapenem resistance KPC NOT DETECTED NOT DETECTED Final   Carbapenem resistance NDM NOT DETECTED NOT DETECTED Final   Carbapenem resist OXA 48 LIKE NOT DETECTED NOT DETECTED Final   Carbapenem resistance VIM NOT DETECTED NOT DETECTED Final    Comment: Performed at [facility], [address], [place], [place] [address]  Culture, blood (Routine X 2) w Reflex to ID Panel     Status: None (Preliminary result)   Collection Time: 06/23/22 11:05 AM   Specimen: Right Antecubital; Blood  Result Value Ref Range Status   Specimen Description RIGHT ANTECUBITAL  Final   Special Requests   Final    BOTTLES DRAWN AEROBIC AND ANAEROBIC Blood Culture results may not be optimal due to an excessive volume of blood received in culture bottles   Culture   Final    NO GROWTH 3 DAYS Performed at [facility], [address] [place], [place] [address]    Report Status PENDING  Incomplete  Culture, blood (Routine X 2) w Reflex to ID Panel     Status: None (Preliminary result)   Collection Time: 06/23/22 11:05 AM   Specimen: BLOOD RIGHT HAND  Result Value Ref Range Status   Specimen Description BLOOD RIGHT HAND  Final   Special Requests   Final    BOTTLES DRAWN AEROBIC AND ANAEROBIC Blood Culture adequate volume   Culture   Final    NO GROWTH 3 DAYS Performed at [facility], [address], [place], [place] [address]    Report Status PENDING  Incomplete  MRSA Next Gen by PCR, Nasal     Status: None   Collection Time: 06/24/22 12:29 AM   Specimen: Nasal Mucosa; Nasal Swab  Result Value Ref Range Status   MRSA by PCR Next Gen NOT DETECTED  NOT DETECTED Final    Comment: (NOTE) The GeneXpert MRSA Assay (FDA approved for NASAL specimens only), is one component of a comprehensive MRSA colonization surveillance program. It is not intended to diagnose MRSA infection nor to guide or monitor treatment for MRSA infections. Test performance is not FDA approved in patients less than 28 years old. Performed at [facility], [address], [place], [place] [address]          Radiology Studies: US RENAL  Result Date: 06/24/2022 CLINICAL DATA:  [id number] AKI (acute kidney injury) [facility]) [id number] EXAM: RENAL / URINARY TRACT ULTRASOUND COMPLETE COMPARISON:  June 02, 2021 FINDINGS: Right Kidney: Renal measurements: 8.4 x 4.3 x 4.4 cm = volume: 84 mL. Echogenicity is mildly increased. No mass or hydronephrosis visualized. Cortical thinning. This is  similar in comparison to prior Left Kidney: Renal measurements: 13.1 x 5.8 x 7.2 cm = volume: 288 mL. Echogenicity within the upper limits of normal. No suspicious mass or hydronephrosis visualized. There is a benign 6 cm cyst noted (for which no dedicated imaging follow-up is recommended). Bladder: Subjectively thickened bladder walls with a trabeculated appearance. Other: None. IMPRESSION: 1. No hydronephrosis. 2. Bladder wall prominence with trabeculation. Findings could reflect sequela of chronic outlet obstruction or infection. Recommend correlation with urine analysis. Electronically Signed   By: Valentino Saxon M.D.   On: 06/24/2022 12:48        Scheduled Meds:  sodium chloride   Intravenous Once   amiodarone  200 mg Oral Daily   ferrous sulfate  325 mg Oral Q breakfast   polyethylene glycol  17 g Oral Daily   sodium bicarbonate  650 mg Oral BID   Continuous Infusions:  sodium chloride Stopped (06/24/22 0008)    ceFAZolin (ANCEF) IV 200 mL/hr at 06/26/22 0605     LOS: 9 days    Time spent: 35 minutes    Mekaela Azizi D Manuella Ghazi, DO [facility]  If 7PM-7AM, please contact  night-coverage [email or web address] 06/26/2022, 11:29 AM

## 2022-06-26 NOTE — Progress Notes (Signed)
Patient received 2 units of Platelets today. Tolerated transfusing thus far. Plan of care ongoing.

## 2022-06-26 NOTE — Progress Notes (Signed)
   06/26/22 2446  Provider Notification  Provider Name/Title Dr Manuella Ghazi  Date Provider Notified 06/26/22  Time Provider Notified 0957  Method of Notification Page  Notification Reason Critical result (platelet count 11)  Test performed and critical result platelet 11  Date Critical Result Received 06/26/22  Time Critical Result Received [phone]

## 2022-06-27 DIAGNOSIS — E861 Hypovolemia: Secondary | ICD-10-CM | POA: Diagnosis not present

## 2022-06-27 DIAGNOSIS — I9589 Other hypotension: Secondary | ICD-10-CM | POA: Diagnosis not present

## 2022-06-27 LAB — COMPREHENSIVE METABOLIC PANEL
ALT: 11 U/L (ref 0–44)
AST: 11 U/L — ABNORMAL LOW (ref 15–41)
Albumin: 1.8 g/dL — ABNORMAL LOW (ref 3.5–5.0)
Alkaline Phosphatase: 67 U/L (ref 38–126)
Anion gap: 10 (ref 5–15)
BUN: 86 mg/dL — ABNORMAL HIGH (ref 8–23)
CO2: 18 mmol/L — ABNORMAL LOW (ref 22–32)
Calcium: 7.5 mg/dL — ABNORMAL LOW (ref 8.9–10.3)
Chloride: 109 mmol/L (ref 98–111)
Creatinine, Ser: 4.65 mg/dL — ABNORMAL HIGH (ref 0.61–1.24)
GFR, Estimated: 12 mL/min — ABNORMAL LOW (ref >60)
Glucose, Bld: 97 mg/dL (ref 70–99)
Potassium: 4.5 mmol/L (ref 3.5–5.1)
Sodium: 137 mmol/L (ref 135–145)
Total Bilirubin: 1 mg/dL (ref 0.3–1.2)
Total Protein: 4.2 g/dL — ABNORMAL LOW (ref 6.5–8.1)

## 2022-06-27 LAB — GLUCOSE, CAPILLARY
Glucose-Capillary: 91 mg/dL (ref 70–99)
Glucose-Capillary: 95 mg/dL (ref 70–99)

## 2022-06-27 LAB — PREPARE PLATELET PHERESIS
Unit division: 0
Unit division: 0

## 2022-06-27 LAB — BPAM PLATELET PHERESIS
Blood Product Expiration Date: 202310102359
Blood Product Expiration Date: 202310102359
ISSUE DATE / TIME: 202310071534
ISSUE DATE / TIME: 202310071812
Unit Type and Rh: 5100
Unit Type and Rh: 5100

## 2022-06-27 LAB — CBC
HCT: 22.8 % — ABNORMAL LOW (ref 39.0–52.0)
Hemoglobin: 7.7 g/dL — ABNORMAL LOW (ref 13.0–17.0)
MCH: 32 pg (ref 26.0–34.0)
MCHC: 33.8 g/dL (ref 30.0–36.0)
MCV: 94.6 fL (ref 80.0–100.0)
Platelets: 35 10*3/uL — ABNORMAL LOW (ref 150–400)
RBC: 2.41 MIL/uL — ABNORMAL LOW (ref 4.22–5.81)
RDW: 15.4 % (ref 11.5–15.5)
WBC: 3 10*3/uL — ABNORMAL LOW (ref 4.0–10.5)
nRBC: 0 % (ref 0.0–0.2)

## 2022-06-27 LAB — MAGNESIUM: Magnesium: 2.1 mg/dL (ref 1.7–2.4)

## 2022-06-27 MED ORDER — SODIUM BICARBONATE 650 MG PO TABS
650.0000 mg | ORAL_TABLET | Freq: Three times a day (TID) | ORAL | Status: DC
  Administered 2022-06-27 – 2022-06-29 (×6): 650 mg via ORAL
  Filled 2022-06-27 (×6): qty 1

## 2022-06-27 MED ORDER — SODIUM CHLORIDE 0.9 % IV SOLN
12.5000 mg | Freq: Four times a day (QID) | INTRAVENOUS | Status: DC | PRN
  Administered 2022-06-27 – 2022-06-28 (×3): 12.5 mg via INTRAVENOUS
  Filled 2022-06-27 (×2): qty 0.5

## 2022-06-27 NOTE — Progress Notes (Signed)
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  Reports intermittent dizziness over the last few days, poor oral intake.  He was recently hospitalized and discharged 9/25 after left femur fracture status post ORIF by Dr. Amedeo Kinsman.  He was found to be hypotensive, admitted to the ICU and placed on pressors, and eventually blood cultures showed E. coli.  Hospital course complicated by AKI, anemia and thrombocytopenia status post unit of packed red blood cells and platelets.  His AKI is now worsening and family members do not want to pursue dialysis.  Palliative consulted and plans are for likely transition to comfort measures by 10/9.  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 coli bacteremia, UTI-patient has been weaned off pressors since last night.  Blood cultures showing E. coli, ID consulted, appreciate input.  For now continue ceftriaxone -Shock seems to have resolved, blood pressure is stable. -Now on Ancef per ID day 6/7   Active  problems Acute kidney injury on chronic kidney disease stage IIIb-baseline creatinine 1.5-1.8, currently creatinine is in the 4 range in the setting of shock.  Nephrology consulted and following. -Creatinine appears to be worsening and urine output not documented -Patient is now uremic and case discussed with Dr. Posey Pronto -Recommendation is for likely start of hemodialysis, but with further discussion with wife at bedside, she states that patient has been through quite a bit and would prefer comfort care and no dialysis initiation.  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  He received total of 2 units of packed red blood cells, after second unit he apparently started having chills and had a transfusion reaction.  Hemoglobin has improved some, 7.8 this morning.  Continue to monitor. -Platelets have dropped significantly at 11 K today, transfuse 2 units of platelets.  B12, folate, LDH normal. -He has a component of acute blood loss anemia due to hematoma from recent left hip fracture surgery   Chronic combined systolic/diastolic -last echo with EF 50% G1 DD in 2022.  Continue to hold diuretics.  Does not appear overly fluid overloaded   Permanent A-fib-Rate controlled on amiodarone.  Eliquis held this admission due to patient's anemia. Dr.KC discussed with patient and his wife at the bedside regarding holding anticoagulation for now will need to be reassessed -Metoprolol discontinued with hypotension, unsafe for anticoagulation with severe thrombocytopenia   NHL w/ CNS involvement-last PET scan and MRI of the brain on 06/02/2022 showed complete  response to therapy. Followed by heme oncology outpatient   Type 2 diabetes -A1c stable in 5,blood sugar well controlled.  Continue to monitor     History of comminuted intertrochanteric fracture of left femur ,s/p ORIF on 9/22-Wound looks clean but was  suspected to have intramuscular hematoma.CT hip showed finding of small scattered hematoma but negative for large hematoma or bleeding.  Eliquis remains on hold.  Plan is for skilled nursing facility   Constipation-continue bowel regimen   Overall worsening prognosis. Consult to Palliative Care. Anticipate transition to comfort care by 10/9.   DVT prophylaxis: SCDs Code Status: DNR Family Communication: Discussed with wife at bedside 10/8 Disposition Plan: Overall poor prognosis Status is: Inpatient Remains inpatient appropriate because: IV medications/transfusions   Consultants:  Nephrology Hematology ID Palliative Care   Procedures:  None   Antimicrobials:  Anti-infectives (From admission, onward)    Start     Dose/Rate Route Frequency Ordered Stop   06/25/22 1800  ceFAZolin (ANCEF) IVPB 2g/100 mL premix        2 g 200 mL/hr over 30 Minutes Intravenous Every 12 hours 06/25/22 1526     06/23/22 1800  cefTRIAXone (ROCEPHIN) 2 g in sodium chloride 0.9 % 100 mL IVPB  Status:  Discontinued        2 g 200 mL/hr over 30 Minutes Intravenous Every 24 hours 06/23/22 1326 06/25/22 1512   06/22/22 1800  ceFEPIme (MAXIPIME) 2 g in sodium chloride 0.9 % 100 mL IVPB  Status:  Discontinued        2 g 200 mL/hr over 30 Minutes Intravenous Every 12 hours 06/22/22 1708 06/23/22 1325       Subjective: Patient seen and evaluated today and appears somewhat confused and more somnolent.  He has been vomiting with oral intake and can only tolerate some liquids.  Objective: Vitals:   06/26/22 1824 06/26/22 1843 06/26/22 2052 06/27/22 0334  BP: 109/66 100/74 101/87 (!) 108/51  Pulse: 74 98 85 (!) 53  Resp: 18 19 18 18   Temp: (!) 97.5 F (36.4 C) (!) 97.5 F (36.4 C) (!) 97.5 F (36.4 C) (!) 97.5 F (36.4 C)  TempSrc: Oral Oral Oral Oral  SpO2: 97% 96% 96% 100%  Weight:      Height:        Intake/Output Summary (Last 24 hours) at 06/27/2022 1211 Last data filed at 06/27/2022  0359 Gross per 24 hour  Intake 1175.18 ml  Output 1200 ml  Net -24.82 ml   Filed Weights   06/16/22 1253 06/16/22 1645 06/25/22 0442  Weight: 78.5 kg 80.3 kg 85.3 kg    Examination:  General exam: Appears calm and comfortable  Respiratory system: Clear to auscultation. Respiratory effort normal. Cardiovascular system: S1 & S2 heard, RRR.  Gastrointestinal system: Abdomen is soft Central nervous system: Alert and awake Extremities: No edema Skin: No significant lesions noted Psychiatry: Flat affect.    Data Reviewed: I have personally reviewed following labs and imaging studies  CBC: Recent Labs  Lab 06/22/22 1857 06/23/22 0028 06/24/22 0303 06/25/22 0341 06/26/22 0900 06/27/22 0800  WBC 2.0* 5.3 11.6* 8.4 4.5 3.0*  NEUTROABS 1.8  --   --   --   --   --   HGB 8.4* 8.0* 7.4* 7.8* 8.5* 7.7*  HCT 25.1* 24.6* 22.7* 23.3* 25.1* 22.8*  MCV 94.0 96.5 95.8 92.8 93.3 94.6  PLT 35* 43* 24* 12* 11* 35*   Basic Metabolic Panel: Recent Labs  Lab 06/23/22 0028 06/24/22 0303  06/25/22 0341 06/26/22 0900 06/27/22 0800  NA 139 138 136 135 137  K 4.4 4.2 4.3 4.6 4.5  CL 112* 111 112* 107 109  CO2 18* 16* 16* 17* 18*  GLUCOSE 166* 126* 97 100* 97  BUN 44* 63* 77* 88* 86*  CREATININE 2.20* 3.20* 3.36* 4.06* 4.65*  CALCIUM 7.2* 7.2* 7.4* 7.5* 7.5*  MG  --   --   --  2.1 2.1   GFR: Estimated Creatinine Clearance: 14.8 mL/min (A) (by C-G formula based on SCr of 4.65 mg/dL (H)). Liver Function Tests: Recent Labs  Lab 06/23/22 0028 06/24/22 0303 06/25/22 0341 06/26/22 0900 06/27/22 0800  AST 44* 24 18 12* 11*  ALT 53* 38 29 21 11   ALKPHOS 87 75 72 71 67  BILITOT 3.9* 2.1* 1.3* 1.2 1.0  PROT 4.1* 4.2* 4.1* 4.3* 4.2*  ALBUMIN 1.7* 1.6* 1.6* 1.8* 1.8*   No results for input(s): "LIPASE", "AMYLASE" in the last 168 hours. No results for input(s): "AMMONIA" in the last 168 hours. Coagulation Profile: No results for input(s): "INR", "PROTIME" in the last 168  hours. Cardiac Enzymes: No results for input(s): "CKTOTAL", "CKMB", "CKMBINDEX", "TROPONINI" in the last 168 hours. BNP (last 3 results) No results for input(s): "PROBNP" in the last 8760 hours. HbA1C: No results for input(s): "HGBA1C" in the last 72 hours. CBG: Recent Labs  Lab 06/26/22 0720 06/26/22 1106 06/26/22 1558 06/27/22 0453 06/27/22 0721  GLUCAP 89 107* 109* 91 95   Lipid Profile: No results for input(s): "CHOL", "HDL", "LDLCALC", "TRIG", "CHOLHDL", "LDLDIRECT" in the last 72 hours. Thyroid Function Tests: No results for input(s): "TSH", "T4TOTAL", "FREET4", "T3FREE", "THYROIDAB" in the last 72 hours. Anemia Panel: No results for input(s): "VITAMINB12", "FOLATE", "FERRITIN", "TIBC", "IRON", "RETICCTPCT" in the last 72 hours. Sepsis Labs: Recent Labs  Lab 06/22/22 1805 06/22/22 2005  LATICACIDVEN 1.9 1.8    Recent Results (from the past 240 hour(s))  SARS Coronavirus 2 by RT PCR (hospital order, performed in [facility] hospital lab) *cepheid single result test* Anterior Nasal Swab     Status: None   Collection Time: 06/18/22  2:03 PM   Specimen: Anterior Nasal Swab  Result Value Ref Range Status   SARS Coronavirus 2 by RT PCR NEGATIVE NEGATIVE Final    Comment: (NOTE) SARS-CoV-2 target nucleic acids are NOT DETECTED.  The SARS-CoV-2 RNA is generally detectable in upper and lower respiratory specimens during the acute phase of infection. The lowest concentration of SARS-CoV-2 viral copies this assay can detect is 250 copies / mL. A negative result does not preclude SARS-CoV-2 infection and should not be used as the sole basis for treatment or other patient management decisions.  A negative result may occur with improper specimen collection / handling, submission of specimen other than nasopharyngeal swab, presence of viral mutation(s) within the areas targeted by this assay, and inadequate number of viral copies (<250 copies / mL). A negative result must be  combined with clinical observations, patient history, and epidemiological information.  Fact Sheet for Patients:   https://www.patel.info/  Fact Sheet for Healthcare Providers: https://hall.com/  This test is not yet approved or  cleared by the [place] FDA and has been authorized for detection and/or diagnosis of SARS-CoV-2 by FDA under an Emergency Use Authorization (EUA).  This EUA will remain in effect (meaning this test can be used) for the duration of the COVID-19 declaration under Section 564(b)(1) of the Act, 21 U.S.C. section 360bbb-3(b)(1), unless the authorization is terminated or revoked sooner.  Performed at [facility], [address], [place], [place] [address]   SARS Coronavirus 2 by RT PCR (hospital order, performed in [facility] hospital lab) *cepheid single result test* Anterior Nasal Swab     Status: None   Collection Time: 06/21/22  9:07 AM   Specimen: Anterior Nasal Swab  Result Value Ref Range Status   SARS Coronavirus 2 by RT PCR NEGATIVE NEGATIVE Final    Comment: (NOTE) SARS-CoV-2 target nucleic acids are NOT DETECTED.  The SARS-CoV-2 RNA is generally detectable in upper and lower respiratory specimens during the acute phase of infection. The lowest concentration of SARS-CoV-2 viral copies this assay can detect is 250 copies / mL. A negative result does not preclude SARS-CoV-2 infection and should not be used as the sole basis for treatment or other patient management decisions.  A negative result may occur with improper specimen collection / handling, submission of specimen other than nasopharyngeal swab, presence of viral mutation(s) within the areas targeted by this assay, and inadequate number of viral copies (<250 copies / mL). A negative result must be combined with clinical observations, patient history, and epidemiological information.  Fact Sheet for Patients:    https://www.patel.info/  Fact Sheet for Healthcare Providers: https://hall.com/  This test is not yet approved or  cleared by the [place] FDA and has been authorized for detection and/or diagnosis of SARS-CoV-2 by FDA under an Emergency Use Authorization (EUA).  This EUA will remain in effect (meaning this test can be used) for the duration of the COVID-19 declaration under Section 564(b)(1) of the Act, 21 U.S.C. section 360bbb-3(b)(1), unless the authorization is terminated or revoked sooner.  Performed at [facility], [address], [place], [place] [address]   Urine Culture     Status: Abnormal   Collection Time: 06/21/22  8:31 PM   Specimen: Urine, Clean Catch  Result Value Ref Range Status   Specimen Description   Final    URINE, CLEAN CATCH Performed at [facility], [address] [place], [place] [address]    Special Requests   Final    NONE Performed at [facility], [address], [place], [place] [address]    Culture >=100,000 COLONIES/mL ESCHERICHIA COLI (A)  Final   Report Status 06/24/2022 FINAL  Final   Organism ID, Bacteria ESCHERICHIA COLI (A)  Final      Susceptibility   Escherichia coli - MIC*    AMPICILLIN >=32 RESISTANT Resistant     CEFAZOLIN <=4 SENSITIVE Sensitive     CEFEPIME <=0.12 SENSITIVE Sensitive     CEFTRIAXONE <=0.25 SENSITIVE Sensitive     CIPROFLOXACIN <=0.25 SENSITIVE Sensitive     GENTAMICIN <=1 SENSITIVE Sensitive     IMIPENEM <=0.25 SENSITIVE Sensitive     NITROFURANTOIN <=16 SENSITIVE Sensitive     TRIMETH/SULFA <=20 SENSITIVE Sensitive     AMPICILLIN/SULBACTAM >=32 RESISTANT Resistant     PIP/TAZO <=4 SENSITIVE Sensitive     * >=100,000 COLONIES/mL ESCHERICHIA COLI  Culture, blood (Routine X 2) w Reflex to ID Panel     Status: Abnormal   Collection Time: 06/22/22  6:05 PM   Specimen: BLOOD RIGHT HAND  Result Value Ref Range Status   Specimen Description   Final    BLOOD RIGHT  HAND Performed at [facility], [address] [place], [place] [address]    Special Requests   Final    BOTTLES DRAWN AEROBIC AND ANAEROBIC Blood Culture adequate volume Performed at [address] [place],  [place] [address]    Culture  Setup Time   Final    GRAM NEGATIVE RODS Gram Stain Report Called to,Read Back By and Verified With: HEARP N. AT [id number] ON [id number] BY THOMPSON S. IN BOTH AEROBIC AND ANAEROBIC BOTTLES CRITICAL RESULT CALLED TO, READ BACK BY AND VERIFIED WITH: PHARMD TINA TALBOTT 06/23/22 0957 BY AB Performed at [facility], [place] [address], [place], [place] [address]    Culture ESCHERICHIA COLI (A)  Final   Report Status 06/25/2022 FINAL  Final   Organism ID, Bacteria ESCHERICHIA COLI  Final      Susceptibility   Escherichia coli - MIC*    AMPICILLIN >=32 RESISTANT Resistant     CEFAZOLIN <=4 SENSITIVE Sensitive     CEFEPIME <=0.12 SENSITIVE Sensitive     CEFTAZIDIME <=1 SENSITIVE Sensitive     CEFTRIAXONE <=0.25 SENSITIVE Sensitive     CIPROFLOXACIN <=0.25 SENSITIVE Sensitive     GENTAMICIN <=1 SENSITIVE Sensitive     IMIPENEM <=0.25 SENSITIVE Sensitive     TRIMETH/SULFA <=20 SENSITIVE Sensitive     AMPICILLIN/SULBACTAM 16 INTERMEDIATE Intermediate     PIP/TAZO <=4 SENSITIVE Sensitive     * ESCHERICHIA COLI  Culture, blood (Routine X 2) w Reflex to ID Panel     Status: Abnormal   Collection Time: 06/22/22  6:05 PM   Specimen: BLOOD RIGHT HAND  Result Value Ref Range Status   Specimen Description   Final    BLOOD RIGHT HAND Performed at [facility], [address] [place], [place] [address]    Special Requests   Final    BOTTLES DRAWN AEROBIC AND ANAEROBIC Blood Culture adequate volume Performed at [facility], [address] [place], [place] [address]    Culture  Setup Time   Final    GRAM NEGATIVE RODS Gram Stain Report Called to,Read Back By and Verified With: HEARP N. AT [id number] ON [id number] BY THOMPSON S. IN BOTH AEROBIC AND ANAEROBIC  BOTTLES Performed at [facility], [address] [place], [place] [address]    Culture (A)  Final    ESCHERICHIA COLI SUSCEPTIBILITIES PERFORMED ON PREVIOUS CULTURE WITHIN THE LAST 5 DAYS. Performed at [facility], [place] [address], [place], [place] [address]    Report Status 06/25/2022 FINAL  Final  Blood Culture ID Panel (Reflexed)     Status: Abnormal   Collection Time: 06/22/22  6:05 PM  Result Value Ref Range Status   Enterococcus faecalis NOT DETECTED NOT DETECTED Final   Enterococcus Faecium NOT DETECTED NOT DETECTED Final   Listeria monocytogenes NOT DETECTED NOT DETECTED Final   Staphylococcus species NOT DETECTED NOT DETECTED Final   Staphylococcus aureus (BCID) NOT DETECTED NOT DETECTED Final   Staphylococcus epidermidis NOT DETECTED NOT DETECTED Final   Staphylococcus lugdunensis NOT DETECTED NOT DETECTED Final   Streptococcus species NOT DETECTED NOT DETECTED Final   Streptococcus agalactiae NOT DETECTED NOT DETECTED Final   Streptococcus pneumoniae NOT DETECTED NOT DETECTED Final   Streptococcus pyogenes NOT DETECTED NOT DETECTED Final   A.calcoaceticus-baumannii NOT DETECTED NOT DETECTED Final   Bacteroides fragilis NOT DETECTED NOT DETECTED Final   Enterobacterales DETECTED (A) NOT DETECTED Final    Comment: Enterobacterales represent a large order of gram negative bacteria, not a single organism. CRITICAL RESULT CALLED TO, READ BACK BY AND VERIFIED WITH: PHARMD T. TALBOTT 06/23/22 @0957  BY AB    Enterobacter cloacae complex NOT DETECTED NOT DETECTED Final   Escherichia coli DETECTED (A) NOT DETECTED Final  Comment: CRITICAL RESULT CALLED TO, READ BACK BY AND VERIFIED WITH: PHARMD T. TALBOTT 06/23/22 @0957  BY AB     Klebsiella aerogenes NOT DETECTED NOT DETECTED Final   Klebsiella oxytoca NOT DETECTED NOT DETECTED Final   Klebsiella pneumoniae NOT DETECTED NOT DETECTED Final   Proteus species NOT DETECTED NOT DETECTED Final   Salmonella species NOT  DETECTED NOT DETECTED Final   Serratia marcescens NOT DETECTED NOT DETECTED Final   Haemophilus influenzae NOT DETECTED NOT DETECTED Final   Neisseria meningitidis NOT DETECTED NOT DETECTED Final   Pseudomonas aeruginosa NOT DETECTED NOT DETECTED Final   Stenotrophomonas maltophilia NOT DETECTED NOT DETECTED Final   Candida albicans NOT DETECTED NOT DETECTED Final   Candida auris NOT DETECTED NOT DETECTED Final   Candida glabrata NOT DETECTED NOT DETECTED Final   Candida krusei NOT DETECTED NOT DETECTED Final   Candida parapsilosis NOT DETECTED NOT DETECTED Final   Candida tropicalis NOT DETECTED NOT DETECTED Final   Cryptococcus neoformans/gattii NOT DETECTED NOT DETECTED Final   CTX-M ESBL NOT DETECTED NOT DETECTED Final   Carbapenem resistance IMP NOT DETECTED NOT DETECTED Final   Carbapenem resistance KPC NOT DETECTED NOT DETECTED Final   Carbapenem resistance NDM NOT DETECTED NOT DETECTED Final   Carbapenem resist OXA 48 LIKE NOT DETECTED NOT DETECTED Final   Carbapenem resistance VIM NOT DETECTED NOT DETECTED Final    Comment: Performed at [facility], [address], [place], [place] [address]  Culture, blood (Routine X 2) w Reflex to ID Panel     Status: None (Preliminary result)   Collection Time: 06/23/22 11:05 AM   Specimen: Right Antecubital; Blood  Result Value Ref Range Status   Specimen Description RIGHT ANTECUBITAL  Final   Special Requests   Final    BOTTLES DRAWN AEROBIC AND ANAEROBIC Blood Culture results may not be optimal due to an excessive volume of blood received in culture bottles   Culture   Final    NO GROWTH 4 DAYS Performed at [facility], [address] [place], [place] [address]    Report Status PENDING  Incomplete  Culture, blood (Routine X 2) w Reflex to ID Panel     Status: None (Preliminary result)   Collection Time: 06/23/22 11:05 AM   Specimen: BLOOD RIGHT HAND  Result Value Ref Range Status   Specimen Description BLOOD RIGHT HAND   Final   Special Requests   Final    BOTTLES DRAWN AEROBIC AND ANAEROBIC Blood Culture adequate volume   Culture   Final    NO GROWTH 4 DAYS Performed at [facility], [address] [place], [place] [address]    Report Status PENDING  Incomplete  MRSA Next Gen by PCR, Nasal     Status: None   Collection Time: 06/24/22 12:29 AM   Specimen: Nasal Mucosa; Nasal Swab  Result Value Ref Range Status   MRSA by PCR Next Gen NOT DETECTED NOT DETECTED Final    Comment: (NOTE) The GeneXpert MRSA Assay (FDA approved for NASAL specimens only), is one component of a comprehensive MRSA colonization surveillance program. It is not intended to diagnose MRSA infection nor to guide or monitor treatment for MRSA infections. Test performance is not FDA approved in patients less than 50 years old. Performed at [facility], [address], [place], [place] [address]          Radiology Studies: No results found.      Scheduled Meds:  amiodarone  200 mg Oral  Daily   ferrous sulfate  325 mg Oral Q breakfast   polyethylene glycol  17 g Oral Daily   sodium bicarbonate  650 mg Oral TID   Continuous Infusions:  sodium chloride Stopped (06/24/22 0008)    ceFAZolin (ANCEF) IV 2 g (06/27/22 0503)   promethazine (PHENERGAN) injection (IM or IVPB)       LOS: 10 days    Time spent: 35 minutes    Virlan Kempker Darleen Crocker, DO [facility]  If 7PM-7AM, please contact night-coverage [email or web address] 06/27/2022, 12:11 PM

## 2022-06-28 DIAGNOSIS — Z7189 Other specified counseling: Secondary | ICD-10-CM

## 2022-06-28 DIAGNOSIS — Z515 Encounter for palliative care: Secondary | ICD-10-CM | POA: Diagnosis not present

## 2022-06-28 DIAGNOSIS — N179 Acute kidney failure, unspecified: Secondary | ICD-10-CM | POA: Diagnosis not present

## 2022-06-28 DIAGNOSIS — E861 Hypovolemia: Secondary | ICD-10-CM | POA: Diagnosis not present

## 2022-06-28 DIAGNOSIS — I9589 Other hypotension: Secondary | ICD-10-CM | POA: Diagnosis not present

## 2022-06-28 LAB — BASIC METABOLIC PANEL
Anion gap: 11 (ref 5–15)
BUN: 90 mg/dL — ABNORMAL HIGH (ref 8–23)
CO2: 16 mmol/L — ABNORMAL LOW (ref 22–32)
Calcium: 7.5 mg/dL — ABNORMAL LOW (ref 8.9–10.3)
Chloride: 111 mmol/L (ref 98–111)
Creatinine, Ser: 5.17 mg/dL — ABNORMAL HIGH (ref 0.61–1.24)
GFR, Estimated: 11 mL/min — ABNORMAL LOW (ref >60)
Glucose, Bld: 89 mg/dL (ref 70–99)
Potassium: 4.5 mmol/L (ref 3.5–5.1)
Sodium: 138 mmol/L (ref 135–145)

## 2022-06-28 LAB — GLUCOSE, CAPILLARY
Glucose-Capillary: 90 mg/dL (ref 70–99)
Glucose-Capillary: 104 mg/dL — ABNORMAL HIGH (ref 70–99)

## 2022-06-28 LAB — CULTURE, BLOOD (ROUTINE X 2)
Culture: NO GROWTH
Culture: NO GROWTH
Special Requests: ADEQUATE

## 2022-06-28 LAB — CBC
HCT: 24.4 % — ABNORMAL LOW (ref 39.0–52.0)
Hemoglobin: 8.2 g/dL — ABNORMAL LOW (ref 13.0–17.0)
MCH: 31.5 pg (ref 26.0–34.0)
MCHC: 33.6 g/dL (ref 30.0–36.0)
MCV: 93.8 fL (ref 80.0–100.0)
Platelets: 28 10*3/uL — CL (ref 150–400)
RBC: 2.6 MIL/uL — ABNORMAL LOW (ref 4.22–5.81)
RDW: 15.7 % — ABNORMAL HIGH (ref 11.5–15.5)
WBC: 3.1 10*3/uL — ABNORMAL LOW (ref 4.0–10.5)
nRBC: 0 % (ref 0.0–0.2)

## 2022-06-28 LAB — MAGNESIUM: Magnesium: 2.3 mg/dL (ref 1.7–2.4)

## 2022-06-28 MED ORDER — PROMETHAZINE HCL 25 MG/ML IJ SOLN
INTRAMUSCULAR | Status: AC
  Filled 2022-06-28: qty 1

## 2022-06-28 MED ORDER — CEFAZOLIN SODIUM-DEXTROSE 2-4 GM/100ML-% IV SOLN
2.0000 g | Freq: Two times a day (BID) | INTRAVENOUS | Status: AC
  Administered 2022-06-28: 2 g via INTRAVENOUS
  Filled 2022-06-28: qty 100

## 2022-06-28 NOTE — Consult Note (Signed)
Consultation Note Date: 06/28/2022   Patient Name: Darius Smith  DOB: 1946-02-28  MRN: [id number]  Age / Sex: 76 y.o., male  PCP: [name], MD Referring Physician: Rodena Goldmann, DO  Reason for Consultation: Establishing goals of care  HPI/Patient Profile: 76 y.o. male  with past medical history of stage 3 CKD, COPD, systolic and diastolic CHF, atrial fibrillation, lung cancer with brain mets, Hodgkin's lymphoma, recent left femur fracture s/p ORIF admitted on 06/16/2022 with hypotension and anemia due to septic shock due to E. Coli bacteremia and UTI and renal failure. No plans to pursue dialysis per notes.   Clinical Assessment and Goals of Care: I have reviewed records, notes, diagnostics, and previous palliative notes. I met today with wife Darius Smith while Darius Smith slept. Darius Smith tearfully shares that she has decided to take him home. She is nervous about caring for him at home. We reviewed her support system and hospice care support in the home. We discussed care needs and comfort medications at home. We discussed poor prognosis and expectations at home. Darius Smith reviews her husband's health journey and expresses appropriate sadness about his decline but wishes to respect his wishes and she believes he would desire to be home. Will continue comfort care and support while hospitalized. Will connect with [facility] per Linda's request with goal of Mr. Carchi returning home with help of hospice support tomorrow.   All questions/concerns addressed. Emotional support provided. Updated Dr. Manuella Ghazi and TOC.   Primary Decision Maker NEXT OF KIN wife Darius Smith    SUMMARY OF RECOMMENDATIONS   - DNR - Home with hospice  Code Status/Advance Care Planning: DNR   Symptom Management:  Nausea: Zofran ODT PRN. Phenergan suppository PRN.   Prognosis:  < 2 weeks  Discharge Planning: Home with Hospice       Primary Diagnoses: Present on Admission:  Hypotension  Stage 3b chronic kidney disease (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 The following aspects are pertinent.  Past Medical History:  Diagnosis Date   Allergic rhinitis 07/02/2019   Allergy 2015   Arthritis    both hands   Atrial fibrillation ([place]) 10/28/2017   Dr. Harl Bowie   Atrial fibrillation with RVR ([place]) 07/14/2020   Blood in urine    occ   Bronchitis    Cancer ([place])    Basal cell   Cardiomegaly 10/28/2017   CHF (congestive heart failure) (HCC)    Clotting disorder ([place])    Complication of anesthesia    Mr. Heinlen developed a. fib in recovery after cysto procedure 10/2017 was transferred to [facility] and  underwent successful cardioversion   COPD (chronic obstructive pulmonary disease) ([place]) 06/10/2022   Diabetes ([place])    Diverticulosis of sigmoid colon 08/2017   Noted on colonoscopy   Dysrhythmia    External hemorrhoids 08/2017   GERD (gastroesophageal reflux disease)    History of colon polyps 08/2017   History of kidney stones    History of rectal bleeding    History of right inguinal hernia    Hypertension    Lung cancer ([place]) 06/25/2020   Lymphoma ([place])    Nephrolithiasis 07/02/2019   Normocytic anemia 06/10/2022   Rectal bleeding 09/02/2017   Added automatically from request for surgery [phone]   Shortness of breath    Stage 3 chronic kidney disease ([place])    Type 2 diabetes mellitus ([place]) 06/10/2022   Social History   Socioeconomic History    Marital status: Married    Spouse name: Not on file   Number of children: 2   Years of education: Not on file   Highest education level: Not on file  Occupational History   Occupation: retired  Tobacco Use   Smoking status: Never   Smokeless tobacco: Never   Tobacco comments:    Never smoke 11/3021  Vaping Use   Vaping Use: Never used  Substance and Sexual Activity   Alcohol use: Not Currently   Drug use: No   Sexual activity: Not Currently  Other Topics Concern   Not on file  Social History Narrative   Lives with wife Darius Smith of 35 years April 15, 2020      2 children, 2 grandchildren   Dog: Gardner Candle      Enjoys: being outdoors       Diet: eats all food groups    Caffeine: drink coffee in the morning, some tea, diet dr pepper   Water: 3-4 cups daily         Wears seat belt    Does not use phone while driving   [place] put up not loaded          Social Determinants of Health   Financial Resource Strain: Low Risk  (03/18/2022)   Overall Financial Resource Strain (CARDIA)    Difficulty of Paying Living Expenses: Not hard at all  Food Insecurity: No Food Insecurity (06/16/2022)   Hunger Vital Sign    Worried About Running Out of Food in the Last Year: Never true    Ran Out of Food in the Last Year: Never true  Transportation Needs: No Transportation Needs (06/16/2022)   PRAPARE - Hydrologist (Medical): No    Lack of Transportation (Non-Medical): No  Physical Activity: Insufficiently Active (03/18/2022)   Exercise Vital Sign    Days of Exercise per Week: 6 days    Minutes of Exercise per Session: 20 min  Stress: No Stress Concern Present (03/18/2022)   [place] [facility]    Feeling of Stress : Not at all  Social Connections: Unknown (03/18/2022)   Social Connection and Isolation Panel [NHANES]    Frequency of Communication with Friends and Family: More than  three times a week    Frequency of Social Gatherings with Friends and Family: Twice a week    Attends Religious Services: Not on Diplomatic Services operational officer of Clubs or Organizations: Yes    Attends Archivist Meetings: 1 to 4 times per year    Marital  Status: Married   Family History  Problem Relation Age of Onset   Breast cancer Mother    Cancer Mother    Prostate cancer Father    Cancer Father    COPD Sister    Birth defects Son    Scheduled Meds:  amiodarone  200 mg Oral Daily   ferrous sulfate  325 mg Oral Q breakfast   polyethylene glycol  17 g Oral Daily   sodium bicarbonate  650 mg Oral TID   Continuous Infusions:  sodium chloride Stopped (06/24/22 0008)    ceFAZolin (ANCEF) IV 2 g (06/28/22 0505)   promethazine (PHENERGAN) injection (IM or IVPB) 12.5 mg (06/28/22 0539)   PRN Meds:.acetaminophen **OR** acetaminophen, albuterol, ondansetron **OR** ondansetron (ZOFRAN) IV, oxyCODONE, promethazine (PHENERGAN) injection (IM or IVPB) Allergies  Allergen Reactions   Flomax [Tamsulosin Hcl] Nausea Only    Dizziness  Pt is on flomax   Heparin Other (See Comments)    Tingling in face and shortness of breath   Review of Systems  Unable to perform ROS: Acuity of condition    Physical Exam Vitals and nursing note reviewed.  Constitutional:      General: He is sleeping. He is not in acute distress.    Appearance: He is ill-appearing.  Cardiovascular:     Rate and Rhythm: Tachycardia present.  Pulmonary:     Effort: No tachypnea, accessory muscle usage or respiratory distress.  Neurological:     Comments: Sleeping - did not awaken     Vital Signs: BP 111/65 (BP Location: Right Arm)   Pulse (!) 106   Temp 99.2 F (37.3 C)   Resp 20   Ht 6' (1.829 m)   Wt 85.3 kg   SpO2 96%   BMI 25.50 kg/m  Pain Scale: 0-10   Pain Score: 0-No pain   SpO2: SpO2: 96 % O2 Device:SpO2: 96 % O2 Flow Rate: .O2 Flow Rate (L/min): 1 L/min  IO: Intake/output summary:   Intake/Output Summary (Last 24 hours) at 06/28/2022 0905 Last data filed at 06/28/2022 0656 Gross per 24 hour  Intake 390 ml  Output 950 ml  Net -560 ml    LBM: Last BM Date : 06/24/22 Baseline Weight: Weight: 78.5 kg Most recent weight: Weight: 85.3 kg     Palliative Assessment/Data:     Time In: 0945  Time Total: 55 min  Greater than 50%  of this time was spent counseling and coordinating care related to the above assessment and plan.  Signed by: Vinie Sill, NP Palliative Medicine Team Pager # [phone] (M-F 8a-5p) Team Phone # [phone] (Nights/Weekends)

## 2022-06-28 NOTE — Progress Notes (Signed)
Patient ID: Darius Smith, male   DOB: 21-May-1946, 76 y.o.   MRN: [id number]   S: Notes state that he will likely transition to comfort care today but I see no note from palliative ?  Got plt transfusion and still getting labs which show worsening renal function 950 of UOP  O:BP 111/65 (BP Location: Right Arm)   Pulse (!) 106   Temp 99.2 F (37.3 C)   Resp 20   Ht 6' (1.829 m)   Wt 85.3 kg   SpO2 96%   BMI 25.50 kg/m   Intake/Output Summary (Last 24 hours) at 06/28/2022 0751 Last data filed at 06/28/2022 0656 Gross per 24 hour  Intake 390 ml  Output 950 ml  Net -560 ml   Intake/Output: I/O last 3 completed shifts: In: 743.1 [P.O.:240; Blood:352.5; IV Piggyback:150.6] Out: 1250 [Urine:1250]  Intake/Output this shift:  No intake/output data recorded. Weight change:  Gen: NAD CVS: Tachy, no rub Resp: CTA Abd: +BS, soft, NT/ND Ext: 1+ pretibial edema b/l, 2 + presacral edema  Recent Labs  Lab 06/22/22 1545 06/23/22 0028 06/24/22 0303 06/25/22 0341 06/26/22 0900 06/27/22 0800 06/28/22 0440  NA 136 139 138 136 135 137 138  K 4.2 4.4 4.2 4.3 4.6 4.5 4.5  CL 113* 112* 111 112* 107 109 111  CO2 15* 18* 16* 16* 17* 18* 16*  GLUCOSE 112* 166* 126* 97 100* 97 89  BUN 42* 44* 63* 77* 88* 86* 90*  CREATININE 1.91* 2.20* 3.20* 3.36* 4.06* 4.65* 5.17*  ALBUMIN 1.9* 1.7* 1.6* 1.6* 1.8* 1.8*  --   CALCIUM 7.5* 7.2* 7.2* 7.4* 7.5* 7.5* 7.5*  AST 47* 44* 24 18 12* 11*  --   ALT 57* 53* 38 29 21 11   --    Liver Function Tests: Recent Labs  Lab 06/25/22 0341 06/26/22 0900 06/27/22 0800  AST 18 12* 11*  ALT 29 21 11   ALKPHOS 72 71 67  BILITOT 1.3* 1.2 1.0  PROT 4.1* 4.3* 4.2*  ALBUMIN 1.6* 1.8* 1.8*   No results for input(s): "LIPASE", "AMYLASE" in the last 168 hours. No results for input(s): "AMMONIA" in the last 168 hours. CBC: Recent Labs  Lab 06/22/22 1857 06/23/22 0028 06/24/22 0303 06/25/22 0341 06/26/22 0900 06/27/22 0800 06/28/22 0440  WBC 2.0*   < >  11.6* 8.4 4.5 3.0* 3.1*  NEUTROABS 1.8  --   --   --   --   --   --   HGB 8.4*   < > 7.4* 7.8* 8.5* 7.7* 8.2*  HCT 25.1*   < > 22.7* 23.3* 25.1* 22.8* 24.4*  MCV 94.0   < > 95.8 92.8 93.3 94.6 93.8  PLT 35*   < > 24* 12* 11* 35* 28*   < > = values in this interval not displayed.   Cardiac Enzymes: No results for input(s): "CKTOTAL", "CKMB", "CKMBINDEX", "TROPONINI" in the last 168 hours. CBG: Recent Labs  Lab 06/26/22 1106 06/26/22 1558 06/27/22 0453 06/27/22 0721 06/28/22 0519  GLUCAP 107* 109* 91 95 90    Iron Studies: No results for input(s): "IRON", "TIBC", "TRANSFERRIN", "FERRITIN" in the last 72 hours. Studies/Results: No results found.  amiodarone  200 mg Oral Daily   ferrous sulfate  325 mg Oral Q breakfast   polyethylene glycol  17 g Oral Daily   sodium bicarbonate  650 mg Oral TID    BMET    Component Value Date/Time   NA 138 06/28/2022 0440   NA 144 08/10/2021  0833   K 4.5 06/28/2022 0440   CL 111 06/28/2022 0440   CO2 16 (L) 06/28/2022 0440   GLUCOSE 89 06/28/2022 0440   BUN 90 (H) 06/28/2022 0440   BUN 13 08/10/2021 0833   CREATININE 5.17 (H) 06/28/2022 0440   CREATININE 1.56 (H) 07/09/2019 0850   CALCIUM 7.5 (L) 06/28/2022 0440   GFRNONAA 11 (L) 06/28/2022 0440   GFRNONAA 43 (L) 07/09/2019 0850   GFRAA 55 (L) 06/11/2020 1455   GFRAA 50 (L) 07/09/2019 0850   CBC    Component Value Date/Time   WBC 3.1 (L) 06/28/2022 0440   RBC 2.60 (L) 06/28/2022 0440   HGB 8.2 (L) 06/28/2022 0440   HGB 12.4 (L) 08/10/2021 0833   HCT 24.4 (L) 06/28/2022 0440   HCT 38.3 08/10/2021 0833   PLT 28 (LL) 06/28/2022 0440   PLT 261 08/10/2021 0833   MCV 93.8 06/28/2022 0440   MCV 93 08/10/2021 0833   MCH 31.5 06/28/2022 0440   MCHC 33.6 06/28/2022 0440   RDW 15.7 (H) 06/28/2022 0440   RDW 13.6 08/10/2021 0833   LYMPHSABS 0.1 (L) 06/22/2022 1857   LYMPHSABS 2.1 08/10/2021 0833   MONOABS 0.1 06/22/2022 1857   EOSABS 0.0 06/22/2022 1857   EOSABS 0.5 (H)  08/10/2021 0833   BASOSABS 0.0 06/22/2022 1857   BASOSABS 0.1 08/10/2021 0833      Assessment/Plan:  AKI/CKD stage IIIb - presumably ischemic ATN in setting of E. Coli urosepsis and need for pressors.  UOP  dropped  but then increased .  Renal US negative for obstruction.    holding diuretics for now given hypotension.  Dr. Loletha Grayer discussed possible need for dialysis but hoping to avoid.  Labs worsened over the weekend-  Dr. Manuella Ghazi had discussed with wife who did not wish to pursue HD due to pts decline in function of late so that is the plan .  In light of that decision and worsening renal function -  prognosis would be poor-   days to weeks E. Coli Urosepsis - currently on ancef Acute blood loss anemia - s/p hip surgery and hematoma.  Received 2 units PRBC's this admission. Pancytopenia - presumably due to chemotherapy but now with elevated WBC due to sepsis. Chronic combined systolic and diastolic CHF - has lower extremity edema.  Holding IVF's and diuretics for now and follow.  Permanent atrial fibrillation - rate controlled on amiodarone.  Was on Eliquis but held for surgery. DM - per primary S/p left femur fracture s/p IM nail.    Renal will sign off due to the decision to not pursue dialysis -  supportive care-  call with any questions   [facility]

## 2022-06-28 NOTE — Progress Notes (Signed)
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  Reports intermittent dizziness over the last few days, poor oral intake.  He was recently hospitalized and discharged 9/25 after left femur fracture status post ORIF by Dr. Amedeo Kinsman.  He was found to be hypotensive, admitted to the ICU and placed on pressors, and eventually blood cultures showed E. coli.  Hospital course complicated by AKI, anemia and thrombocytopenia status post unit of packed red blood cells and platelets.  His AKI is now worsening and family members do not want to pursue dialysis.  Palliative consulted and plans are for discharge to home with hospice 10/10.  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 coli bacteremia, UTI-patient has been weaned off pressors since last night.  Blood cultures showing E. coli, ID consulted, appreciate input.  For now continue ceftriaxone -Shock seems to have resolved, blood pressure is stable. -Now on Ancef per ID day 7/7, discontinue after  today   Active problems Acute kidney injury on chronic kidney disease stage IIIb-baseline creatinine 1.5-1.8, currently creatinine is in the 4 range in the setting of shock.  Nephrology consulted and following. -Creatinine appears to be worsening and urine output not documented -Patient is now uremic and case discussed with Dr. Posey Pronto -Recommendation is for likely start of hemodialysis, but with further discussion with wife at bedside, she states that patient has been through quite a bit and would prefer comfort care and no dialysis initiation. 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  He received total of 2 units of packed red blood cells, after second unit he apparently started having chills and had a transfusion reaction.  Hemoglobin has improved some, 7.8 this morning.  Continue to monitor. -Platelets have dropped significantly at 11 K today, transfuse 2 units of platelets.  B12, folate, LDH normal. -He has a component of acute blood loss anemia due to hematoma from recent left hip fracture surgery   Chronic combined systolic/diastolic -last echo with EF 50% G1 DD in 2022.  Continue to hold diuretics.  Does not appear overly fluid overloaded   Permanent A-fib-Rate controlled on amiodarone.  Eliquis held this admission due to patient's anemia. 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 Followed by heme oncology outpatient   Type 2 diabetes -A1c stable in 5,blood sugar well controlled.  Continue to monitor     History of comminuted intertrochanteric  fracture of left femur ,s/p ORIF on 9/22-Wound looks clean but was suspected to have intramuscular hematoma.CT hip showed finding of small scattered hematoma but negative for large hematoma or bleeding.  Eliquis remains on hold.  Plan is for skilled nursing facility   Constipation-continue bowel regimen   Overall worsening prognosis. Consult to Palliative Care. Anticipate transition to comfort care by 10/9.   DVT prophylaxis: SCDs Code Status: DNR Family Communication: Discussed with wife at bedside 10/9 Disposition Plan: Overall poor prognosis Status is: Inpatient Remains inpatient appropriate because: IV medications/transfusions   Consultants:  Nephrology Hematology ID Palliative Care   Procedures:  None   Antimicrobials:  Anti-infectives (From admission, onward)    Start     Dose/Rate Route Frequency Ordered Stop   06/25/22 1800  ceFAZolin (ANCEF) IVPB 2g/100 mL premix        2 g 200 mL/hr over 30 Minutes Intravenous Every 12 hours 06/25/22 1526     06/23/22 1800  cefTRIAXone (ROCEPHIN) 2 g in sodium chloride 0.9 % 100 mL IVPB  Status:  Discontinued        2 g 200 mL/hr over 30 Minutes Intravenous Every 24 hours 06/23/22 1326 06/25/22 1512   06/22/22 1800  ceFEPIme (MAXIPIME) 2 g in sodium chloride 0.9 % 100 mL IVPB  Status:  Discontinued        2 g 200 mL/hr over 30 Minutes Intravenous Every 12 hours 06/22/22 1708 06/23/22 1325      Subjective: Patient seen and evaluated today and states that he is hungry this morning.  No acute overnight events noted.  Nausea appears improved with use of Phenergan.  Objective: Vitals:   06/27/22 0334 06/27/22 1625 06/27/22 2126 06/28/22 0517  BP: (!) 108/51 116/64 122/64 111/65  Pulse: (!) 53 (!) 55 (!) 54 (!) 106  Resp: 18 18 20 20   Temp: (!) 97.5 F (36.4 C) 98.3 F (36.8 C) 98 F (36.7 C) 99.2 F (37.3 C)  TempSrc: Oral Oral    SpO2: 100% 96% 99% 96%  Weight:      Height:        Intake/Output Summary (Last 24 hours)  at 06/28/2022 1038 Last data filed at 06/28/2022 0656 Gross per 24 hour  Intake 390 ml  Output 950 ml  Net -560 ml   Filed Weights   06/16/22 1253 06/16/22 1645 06/25/22 0442  Weight: 78.5 kg 80.3 kg 85.3 kg    Examination:  General exam: Appears calm and comfortable  Respiratory system: Clear to auscultation. Respiratory effort normal. Cardiovascular system: S1 & S2 heard, RRR.  Gastrointestinal system: Abdomen is soft Central nervous system: Alert and awake Extremities: No edema Skin: No significant lesions noted Psychiatry: Flat affect.    Data Reviewed: I have personally reviewed following labs and imaging studies  CBC: Recent Labs  Lab 06/22/22 1857 06/23/22 0028 06/24/22 0303 06/25/22 0341 06/26/22 0900 06/27/22 0800 06/28/22 0440  WBC 2.0*   < > 11.6* 8.4 4.5 3.0* 3.1*  NEUTROABS 1.8  --   --   --   --   --   --   HGB 8.4*   < > 7.4* 7.8* 8.5* 7.7* 8.2*  HCT 25.1*   < > 22.7* 23.3* 25.1* 22.8* 24.4*  MCV 94.0   < > 95.8 92.8 93.3 94.6 93.8  PLT 35*   < > 24* 12* 11* 35* 28*   < > = values in this interval not displayed.   Basic Metabolic Panel: Recent Labs  Lab 06/24/22 0303 06/25/22 0341 06/26/22 0900 06/27/22 0800 06/28/22 0440  NA 138 136 135 137 138  K 4.2 4.3 4.6 4.5 4.5  CL 111 112* 107 109 111  CO2 16* 16* 17* 18* 16*  GLUCOSE 126* 97 100* 97 89  BUN 63* 77* 88* 86* 90*  CREATININE 3.20* 3.36* 4.06* 4.65* 5.17*  CALCIUM 7.2* 7.4* 7.5* 7.5* 7.5*  MG  --   --  2.1 2.1 2.3   GFR: Estimated Creatinine Clearance: 13.3 mL/min (A) (by C-G formula based on SCr of 5.17 mg/dL (H)). Liver Function Tests: Recent Labs  Lab 06/23/22 0028 06/24/22 0303 06/25/22 0341 06/26/22 0900 06/27/22 0800  AST 44* 24 18 12* 11*  ALT 53* 38 29 21 11   ALKPHOS 87 75 72 71 67  BILITOT 3.9* 2.1* 1.3* 1.2 1.0  PROT 4.1* 4.2* 4.1* 4.3* 4.2*  ALBUMIN 1.7* 1.6* 1.6* 1.8* 1.8*   No results for input(s): "LIPASE", "AMYLASE" in the last 168 hours. No results for  input(s): "AMMONIA" in the last 168 hours. Coagulation Profile: No results for input(s): "INR", "PROTIME" in the last 168 hours. Cardiac Enzymes: No results for input(s): "CKTOTAL", "CKMB", "CKMBINDEX", "TROPONINI" in the last 168 hours. BNP (last 3 results) No results for input(s): "PROBNP" in the last 8760 hours. HbA1C: No results for input(s): "HGBA1C" in the last 72 hours. CBG: Recent Labs  Lab 06/26/22 1106 06/26/22 1558 06/27/22 0453 06/27/22 0721 06/28/22 0519  GLUCAP 107* 109* 91 95 90   Lipid Profile: No results for input(s): "CHOL", "HDL", "LDLCALC", "TRIG", "CHOLHDL", "LDLDIRECT" in the last 72 hours. Thyroid Function Tests: No results for input(s): "TSH", "T4TOTAL", "FREET4", "T3FREE", "THYROIDAB" in the last 72 hours. Anemia Panel: No results for input(s): "VITAMINB12", "FOLATE", "FERRITIN", "TIBC", "IRON", "RETICCTPCT" in the last 72 hours. Sepsis Labs: Recent Labs  Lab 06/22/22 1805 06/22/22 2005  LATICACIDVEN 1.9 1.8    Recent Results (from the past 240 hour(s))  SARS Coronavirus 2 by RT PCR (hospital order, performed in [facility] hospital lab) *cepheid single result test* Anterior Nasal Swab     Status: None   Collection Time: 06/18/22  2:03 PM   Specimen: Anterior Nasal Swab  Result Value Ref Range Status   SARS Coronavirus 2 by RT PCR NEGATIVE NEGATIVE Final    Comment: (NOTE) SARS-CoV-2 target nucleic acids are NOT DETECTED.  The SARS-CoV-2 RNA is generally detectable in upper and lower respiratory specimens during the acute phase of infection. The lowest concentration of SARS-CoV-2 viral copies this assay can detect is 250 copies / mL. A negative result does not preclude SARS-CoV-2 infection and should not be used as the sole basis for treatment or other patient management decisions.  A negative result may occur with improper specimen collection / handling, submission of specimen other than nasopharyngeal swab, presence of viral mutation(s)  within the areas targeted by this assay, and inadequate number of viral copies (<250 copies / mL). A negative result must be combined with clinical observations, patient history, and epidemiological information.  Fact Sheet for Patients:   https://www.patel.info/  Fact Sheet for Healthcare Providers: https://hall.com/  This test is not yet approved or  cleared by the [place] FDA and has been authorized for detection and/or diagnosis of SARS-CoV-2 by  FDA under an Emergency Use Authorization (EUA).  This EUA will remain in effect (meaning this test can be used) for the duration of the COVID-19 declaration under Section 564(b)(1) of the Act, 21 U.S.C. section 360bbb-3(b)(1), unless the authorization is terminated or revoked sooner.  Performed at [facility], [address], [place], [place] [address]   SARS Coronavirus 2 by RT PCR (hospital order, performed in [facility] hospital lab) *cepheid single result test* Anterior Nasal Swab     Status: None   Collection Time: 06/21/22  9:07 AM   Specimen: Anterior Nasal Swab  Result Value Ref Range Status   SARS Coronavirus 2 by RT PCR NEGATIVE NEGATIVE Final    Comment: (NOTE) SARS-CoV-2 target nucleic acids are NOT DETECTED.  The SARS-CoV-2 RNA is generally detectable in upper and lower respiratory specimens during the acute phase of infection. The lowest concentration of SARS-CoV-2 viral copies this assay can detect is 250 copies / mL. A negative result does not preclude SARS-CoV-2 infection and should not be used as the sole basis for treatment or other patient management decisions.  A negative result may occur with improper specimen collection / handling, submission of specimen other than nasopharyngeal swab, presence of viral mutation(s) within the areas targeted by this assay, and inadequate number of viral copies (<250 copies / mL). A negative result must be combined with  clinical observations, patient history, and epidemiological information.  Fact Sheet for Patients:   https://www.patel.info/  Fact Sheet for Healthcare Providers: https://hall.com/  This test is not yet approved or  cleared by the [place] FDA and has been authorized for detection and/or diagnosis of SARS-CoV-2 by FDA under an Emergency Use Authorization (EUA).  This EUA will remain in effect (meaning this test can be used) for the duration of the COVID-19 declaration under Section 564(b)(1) of the Act, 21 U.S.C. section 360bbb-3(b)(1), unless the authorization is terminated or revoked sooner.  Performed at [facility], [address], [place], [place] [address]   Urine Culture     Status: Abnormal   Collection Time: 06/21/22  8:31 PM   Specimen: Urine, Clean Catch  Result Value Ref Range Status   Specimen Description   Final    URINE, CLEAN CATCH Performed at [facility], [address] [place], [place] [address]    Special Requests   Final    NONE Performed at [facility], [address] [place], [place] [address]    Culture >=100,000 COLONIES/mL ESCHERICHIA COLI (A)  Final   Report Status 06/24/2022 FINAL  Final   Organism ID, Bacteria ESCHERICHIA COLI (A)  Final      Susceptibility   Escherichia coli - MIC*    AMPICILLIN >=32 RESISTANT Resistant     CEFAZOLIN <=4 SENSITIVE Sensitive     CEFEPIME <=0.12 SENSITIVE Sensitive     CEFTRIAXONE <=0.25 SENSITIVE Sensitive     CIPROFLOXACIN <=0.25 SENSITIVE Sensitive     GENTAMICIN <=1 SENSITIVE Sensitive     IMIPENEM <=0.25 SENSITIVE Sensitive     NITROFURANTOIN <=16 SENSITIVE Sensitive     TRIMETH/SULFA <=20 SENSITIVE Sensitive     AMPICILLIN/SULBACTAM >=32 RESISTANT Resistant     PIP/TAZO <=4 SENSITIVE Sensitive     * >=100,000 COLONIES/mL ESCHERICHIA COLI  Culture, blood (Routine X 2) w Reflex to ID Panel     Status: Abnormal   Collection Time: 06/22/22  6:05 PM    Specimen: BLOOD RIGHT HAND  Result Value Ref Range Status   Specimen Description   Final  BLOOD RIGHT HAND Performed at [facility], [address], [place], [place] [address]    Special Requests   Final    BOTTLES DRAWN AEROBIC AND ANAEROBIC Blood Culture adequate volume Performed at [place] [place], [place] [address]    Culture  Setup Time   Final    GRAM NEGATIVE RODS Gram Stain Report Called to,Read Back By and Verified With: HEARP N. AT [id number] ON [id number] BY THOMPSON S. IN BOTH AEROBIC AND ANAEROBIC BOTTLES CRITICAL RESULT CALLED TO, READ BACK BY AND VERIFIED WITH: PHARMD TINA TALBOTT 06/23/22 0957 BY AB Performed at [facility], [place] [address] [place], [place] [address]    Culture ESCHERICHIA COLI (A)  Final   Report Status 06/25/2022 FINAL  Final   Organism ID, Bacteria ESCHERICHIA COLI  Final      Susceptibility   Escherichia coli - MIC*    AMPICILLIN >=32 RESISTANT Resistant     CEFAZOLIN <=4 SENSITIVE Sensitive     CEFEPIME <=0.12 SENSITIVE Sensitive     CEFTAZIDIME <=1 SENSITIVE Sensitive     CEFTRIAXONE <=0.25 SENSITIVE Sensitive     CIPROFLOXACIN <=0.25 SENSITIVE Sensitive     GENTAMICIN <=1 SENSITIVE Sensitive     IMIPENEM <=0.25 SENSITIVE Sensitive     TRIMETH/SULFA <=20 SENSITIVE Sensitive     AMPICILLIN/SULBACTAM 16 INTERMEDIATE Intermediate     PIP/TAZO <=4 SENSITIVE Sensitive     * ESCHERICHIA COLI  Culture, blood (Routine X 2) w Reflex to ID Panel     Status: Abnormal   Collection Time: 06/22/22  6:05 PM   Specimen: BLOOD RIGHT HAND  Result Value Ref Range Status   Specimen Description   Final    BLOOD RIGHT HAND Performed at [facility], [address] [place], [place] [address]    Special Requests   Final    BOTTLES DRAWN AEROBIC AND ANAEROBIC Blood Culture adequate volume Performed at [facility], [address], [place], [place] [address]    Culture  Setup Time   Final    GRAM NEGATIVE RODS Gram Stain Report Called  to,Read Back By and Verified With: HEARP N. AT [id number] ON [id number] BY THOMPSON S. IN BOTH AEROBIC AND ANAEROBIC BOTTLES Performed at [facility], [address], [place], [place] [address]    Culture (A)  Final    ESCHERICHIA COLI SUSCEPTIBILITIES PERFORMED ON PREVIOUS CULTURE WITHIN THE LAST 5 DAYS. Performed at [facility], [place] [address], [place],  [address]    Report Status 06/25/2022 FINAL  Final  Blood Culture ID Panel (Reflexed)     Status: Abnormal   Collection Time: 06/22/22  6:05 PM  Result Value Ref Range Status   Enterococcus faecalis NOT DETECTED NOT DETECTED Final   Enterococcus Faecium NOT DETECTED NOT DETECTED Final   Listeria monocytogenes NOT DETECTED NOT DETECTED Final   Staphylococcus species NOT DETECTED NOT DETECTED Final   Staphylococcus aureus (BCID) NOT DETECTED NOT DETECTED Final   Staphylococcus epidermidis NOT DETECTED NOT DETECTED Final   Staphylococcus lugdunensis NOT DETECTED NOT DETECTED Final   Streptococcus species NOT DETECTED NOT DETECTED Final   Streptococcus agalactiae NOT DETECTED NOT DETECTED Final   Streptococcus pneumoniae NOT DETECTED NOT DETECTED Final   Streptococcus pyogenes NOT DETECTED NOT DETECTED Final   A.calcoaceticus-baumannii NOT DETECTED NOT DETECTED Final   Bacteroides fragilis NOT DETECTED NOT DETECTED Final   Enterobacterales DETECTED (A) NOT DETECTED Final    Comment: Enterobacterales represent a large order of gram negative  bacteria, not a single organism. CRITICAL RESULT CALLED TO, READ BACK BY AND VERIFIED WITH: PHARMD T. TALBOTT 06/23/22 @0957  BY AB    Enterobacter cloacae complex NOT DETECTED NOT DETECTED Final   Escherichia coli DETECTED (A) NOT DETECTED Final    Comment: CRITICAL RESULT CALLED TO, READ BACK BY AND VERIFIED WITH: PHARMD T. TALBOTT 06/23/22 @0957  BY AB     Klebsiella aerogenes NOT DETECTED NOT DETECTED Final   Klebsiella oxytoca NOT DETECTED NOT DETECTED Final   Klebsiella pneumoniae NOT  DETECTED NOT DETECTED Final   Proteus species NOT DETECTED NOT DETECTED Final   Salmonella species NOT DETECTED NOT DETECTED Final   Serratia marcescens NOT DETECTED NOT DETECTED Final   Haemophilus influenzae NOT DETECTED NOT DETECTED Final   Neisseria meningitidis NOT DETECTED NOT DETECTED Final   Pseudomonas aeruginosa NOT DETECTED NOT DETECTED Final   Stenotrophomonas maltophilia NOT DETECTED NOT DETECTED Final   Candida albicans NOT DETECTED NOT DETECTED Final   Candida auris NOT DETECTED NOT DETECTED Final   Candida glabrata NOT DETECTED NOT DETECTED Final   Candida krusei NOT DETECTED NOT DETECTED Final   Candida parapsilosis NOT DETECTED NOT DETECTED Final   Candida tropicalis NOT DETECTED NOT DETECTED Final   Cryptococcus neoformans/gattii NOT DETECTED NOT DETECTED Final   CTX-M ESBL NOT DETECTED NOT DETECTED Final   Carbapenem resistance IMP NOT DETECTED NOT DETECTED Final   Carbapenem resistance KPC NOT DETECTED NOT DETECTED Final   Carbapenem resistance NDM NOT DETECTED NOT DETECTED Final   Carbapenem resist OXA 48 LIKE NOT DETECTED NOT DETECTED Final   Carbapenem resistance VIM NOT DETECTED NOT DETECTED Final    Comment: Performed at [facility], [address], [place], [place] [address]  Culture, blood (Routine X 2) w Reflex to ID Panel     Status: None   Collection Time: 06/23/22 11:05 AM   Specimen: Right Antecubital; Blood  Result Value Ref Range Status   Specimen Description RIGHT ANTECUBITAL  Final   Special Requests   Final    BOTTLES DRAWN AEROBIC AND ANAEROBIC Blood Culture results may not be optimal due to an excessive volume of blood received in culture bottles   Culture   Final    NO GROWTH 5 DAYS Performed at [facility], [address] [place], [place] [address]    Report Status 06/28/2022 FINAL  Final  Culture, blood (Routine X 2) w Reflex to ID Panel     Status: None   Collection Time: 06/23/22 11:05 AM   Specimen: BLOOD RIGHT HAND  Result  Value Ref Range Status   Specimen Description BLOOD RIGHT HAND  Final   Special Requests   Final    BOTTLES DRAWN AEROBIC AND ANAEROBIC Blood Culture adequate volume   Culture   Final    NO GROWTH 5 DAYS Performed at [facility], [address], [place], [place] [address]    Report Status 06/28/2022 FINAL  Final  MRSA Next Gen by PCR, Nasal     Status: None   Collection Time: 06/24/22 12:29 AM   Specimen: Nasal Mucosa; Nasal Swab  Result Value Ref Range Status   MRSA by PCR Next Gen NOT DETECTED NOT DETECTED Final    Comment: (NOTE) The GeneXpert MRSA Assay (FDA approved for NASAL specimens only), is one component of a comprehensive MRSA colonization surveillance program. It is not intended to diagnose MRSA infection nor to guide or monitor treatment for MRSA infections. Test performance is not FDA approved in patients  less than 75 years old. Performed at [facility], [address] [place], [place] [address]          Radiology Studies: No results found.      Scheduled Meds:  amiodarone  200 mg Oral Daily   ferrous sulfate  325 mg Oral Q breakfast   polyethylene glycol  17 g Oral Daily   sodium bicarbonate  650 mg Oral TID   Continuous Infusions:  sodium chloride Stopped (06/24/22 0008)    ceFAZolin (ANCEF) IV 2 g (06/28/22 0505)   promethazine (PHENERGAN) injection (IM or IVPB) 12.5 mg (06/28/22 0539)     LOS: 11 days    Time spent: 35 minutes    [place] Cloe Darleen Crocker, DO [facility]  If 7PM-7AM, please contact night-coverage [email or web address] 06/28/2022, 10:38 AM

## 2022-06-28 NOTE — TOC Progression Note (Signed)
Transition of Care [facility]) - Progression Note    Patient Details  Name: Darius Smith MRN: [id number] Date of Birth: 05/22/1946  Transition of Care [facility]) CM/SW Contact  Salome Arnt, [place] Phone Number: 06/28/2022, 10:37 AM  Clinical Narrative:  Pt's wife has decided to take pt home with hospice following palliative conversation. Pt's wife requests [facility]. Referral made and hospice will follow up with wife regarding DME. Anticipate d/c tomorrow after DME delivered.      Expected Discharge Plan: [place] Barriers to Discharge: Continued Medical Work up  Expected Discharge Plan and Services Expected Discharge Plan: [place]                                               Social Determinants of Health (SDOH) Interventions Housing Interventions: Intervention Not Indicated  Readmission Risk Interventions    08/04/2020    4:53 PM 08/01/2020    9:34 AM  Readmission Risk Prevention Plan  Transportation Screening Complete Complete  HRI or Home Care Consult Complete Complete  Social Work Consult for [place] Planning/Counseling Complete Complete  Palliative Care Screening Complete Not Applicable  Medication Review Press photographer) Complete Complete

## 2022-06-29 DIAGNOSIS — I9589 Other hypotension: Secondary | ICD-10-CM | POA: Diagnosis not present

## 2022-06-29 DIAGNOSIS — E861 Hypovolemia: Secondary | ICD-10-CM | POA: Diagnosis not present

## 2022-06-29 LAB — GLUCOSE, CAPILLARY: Glucose-Capillary: 94 mg/dL (ref 70–99)

## 2022-06-29 MED ORDER — MORPHINE SULFATE 20 MG/5ML PO SOLN: 2.5000 mg | ORAL | 0 refills | Status: AC | PRN

## 2022-06-29 MED ORDER — SODIUM CHLORIDE 0.9 % IV SOLN: 12.5000 mg | Freq: Four times a day (QID) | INTRAVENOUS | Status: DC | PRN

## 2022-06-29 MED ORDER — PROMETHAZINE HCL 25 MG RE SUPP: 12.5000 mg | Freq: Four times a day (QID) | RECTAL | Status: DC | PRN

## 2022-06-29 MED ORDER — LORAZEPAM 0.5 MG PO TABS: 0.5000 mg | ORAL_TABLET | Freq: Three times a day (TID) | ORAL | 0 refills | Status: DC | PRN

## 2022-06-29 MED ORDER — ONDANSETRON HCL 4 MG/2ML IJ SOLN: 4.0000 mg | Freq: Three times a day (TID) | INTRAMUSCULAR | Status: DC | PRN

## 2022-06-29 MED ORDER — ONDANSETRON 4 MG PO TBDP: 4.0000 mg | ORAL_TABLET | Freq: Three times a day (TID) | ORAL | 0 refills | Status: AC | PRN

## 2022-06-29 MED ORDER — ONDANSETRON 4 MG PO TBDP: 4.0000 mg | ORAL_TABLET | Freq: Three times a day (TID) | ORAL | Status: DC | PRN

## 2022-06-29 MED ORDER — PROMETHAZINE HCL 12.5 MG RE SUPP: 12.5000 mg | Freq: Four times a day (QID) | RECTAL | 0 refills | Status: AC | PRN

## 2022-06-29 NOTE — Consult Note (Signed)
   [facility] CM Inpatient Consult   06/29/2022  [name] 1945-11-05 [id number]  [place] [facility] [ACO] Patient: Darius Smith [facility]  Primary Care Provider: [name], MD with [facility] Liaison remote coverage for patient at [place] reviewed for length of stay was ICU and reveals the patient is currently transitioning to Hospice/Palliative Care. Patient to transition home with [facility].  Plan: Given this choice patient will have full case management services through Hospice and needs will be met at the hospice level of care. No [facility] Care Management is planned for transitional needs.  Will sign off at transition from the hospital.  For questions,   Darius Brood, RN BSN [place]  [phone] business mobile phone Toll free office [phone]  *[place]  [phone] Fax number: [phone] [email or web address]

## 2022-06-29 NOTE — TOC Transition Note (Signed)
Transition of Care [facility]) - CM/SW Discharge Note   Patient Details  Name: Darius Smith MRN: [id number] Date of Birth: 11/24/1945  Transition of Care [facility]) CM/SW Contact:  Salome Arnt, LCSW Phone Number: 06/29/2022, 8:52 AM   Clinical Narrative:  Pt d/c today. Wife is aware to contact hospice after pt arrives home and they will come to admit. RN notified okay to d/c and [address] will set up EMS transport. Keka at hospice also aware of d/c today.      Final next level of care: Home w Hospice Care Barriers to Discharge: Barriers Resolved   Patient Goals and CMS Choice Patient states their goals for this hospitalization and ongoing recovery are:: patient is in short term rehab      Discharge Placement                  Name of family member notified: wife Patient and family notified of of transfer: 06/29/22  Discharge Plan and Services                                     Social Determinants of Health (SDOH) Interventions Housing Interventions: Intervention Not Indicated   Readmission Risk Interventions    08/04/2020    4:53 PM 08/01/2020    9:34 AM  Readmission Risk Prevention Plan  Transportation Screening Complete Complete  HRI or Home Care Consult Complete Complete  Social Work Consult for [place] Planning/Counseling Complete Complete  Palliative Care Screening Complete Not Applicable  Medication Review [employer] photographer) Complete Complete

## 2022-06-29 NOTE — Progress Notes (Addendum)
Patients discharged home, transported home by EMS. Discharge paperwork went over with spouse Clester Chlebowski), spouse verbalized understanding. Belongings sent with family home.

## 2022-06-29 NOTE — Discharge Summary (Signed)
Physician Discharge Summary  MIGUELANGEL KORN KCM:[id number] DOB: [birth year] DOA: 06/16/2022  PCP: [name], MD  Admit date: 06/16/2022  Discharge date: 06/29/2022  Admitted From:SNF  Disposition:  Home with hospice  Recommendations for Outpatient Follow-up:  Follow up with hospice agency  Home Health:None  Equipment/Devices:Has equipment  Discharge Condition:Stable  CODE STATUS: DNR  Diet recommendation: Regular  Brief/Interim Summary:  76 year old male with CKD 3B, COPD, chronic combined CHF, A-fib, Hodgkin's lymphoma on rituximab, reportedly in remission who comes into the hospital from [facility] after passing out.  Reports intermittent dizziness over the last few days, poor oral intake.  He was recently hospitalized and discharged 9/25 after left femur fracture status post ORIF by Dr. Amedeo Kinsman.  He was initially noted to have a worsening hematoma along with acute blood loss anemia in the setting of Eliquis use.  Later on, he was found to be hypotensive, admitted to the ICU and placed on pressors, and eventually blood cultures showed E. coli.  Hospital course complicated by AKI, anemia and thrombocytopenia status post unit of packed red blood cells and platelets.  His AKI is now worsening and family members do not want to pursue dialysis.  Palliative consulted and plans are for discharge to home with hospice today.   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 coli bacteremia with initial acute blood loss anemia in the setting of recent left femur fracture with ORIF 9/22 on Eliquis.  Pancytopenia due to malignancy with recurrent need for platelet and PRBC transfusion.  Worsening AKI on CKD stage IIIb not a candidate for hemodialysis.  Discharge Instructions  Discharge Instructions     Diet - low sodium heart healthy   Complete by: As directed    Increase activity slowly   Complete by: As directed    No wound care   Complete by: As directed       Allergies as of 06/29/2022       Reactions   Flomax [tamsulosin Hcl] Nausea Only   Dizziness  Pt is on flomax   Heparin Other (See Comments)   Tingling in face and shortness of breath        Medication List     STOP taking these medications    acetaminophen 500 MG tablet Commonly known as: TYLENOL   amiodarone 200 MG tablet Commonly known as: PACERONE   amLODipine 10 MG tablet Commonly known as: NORVASC   blood glucose meter kit and supplies Kit   Eliquis 5 MG Tabs tablet Generic drug: apixaban   ferrous sulfate 325 (65 FE) MG tablet   FreeStyle Libre 2 [employer] 2 Sensor Misc   furosemide 20 MG tablet Commonly known as: LASIX   hydrALAZINE 100 MG tablet Commonly known as: APRESOLINE  insulin lispro 100 UNIT/ML KwikPen Commonly known as: HumaLOG KwikPen   Insulin Pen Needle 29G X 12MM Misc   isosorbide mononitrate 30 MG 24 hr tablet Commonly known as: IMDUR   lenalidomide 10 MG capsule Commonly known as: REVLIMID   OneTouch Delica Plus ERDEYC14G Misc   OneTouch Ultra test strip Generic drug: glucose blood   oxyCODONE 5 MG immediate release tablet Commonly known as: Roxicodone   potassium chloride SA 20 MEQ tablet Commonly known as: KLOR-CON M        TAKE these medications    LORazepam 0.5 MG tablet Commonly known as: Ativan Take 1 tablet (0.5 mg total) by mouth every 8 (eight) hours as needed for anxiety.   morphine 20 MG/5ML solution Take 0.6 mLs (2.4 mg total) by mouth every 2 (two) hours as needed for pain.   omeprazole 20 MG capsule Commonly known as: PRILOSEC Take 1 capsule (20 mg total) by mouth daily as needed (reflux).   ondansetron 4 MG disintegrating tablet Commonly known as: ZOFRAN-ODT Take 1 tablet (4 mg total) by mouth every 8 (eight) hours as needed for nausea or vomiting.   promethazine 12.5 MG suppository Commonly known as: PHENERGAN Place 1 suppository (12.5 mg total) rectally every 6 (six) hours as needed for refractory nausea / vomiting.   Ventolin HFA 108 (90 Base) MCG/ACT inhaler Generic drug: albuterol Inhale 2 puffs into the lungs every 6 (six) hours as needed for wheezing or shortness of breath.        Contact information for after-discharge care     [facility] .   Service: Skilled Nursing Contact information: [address] [place] [address] [phone]                    Allergies  Allergen Reactions   Flomax [Tamsulosin Hcl] Nausea Only    Dizziness  Pt is on flomax   Heparin Other (See Comments)    Tingling in face and shortness of breath    Consultations: Nephrology Hematology ID Palliative care   Procedures/Studies: US RENAL  Result Date: 06/24/2022 CLINICAL DATA:  [id number] AKI (acute kidney injury) ([place]) [id number] EXAM: RENAL / URINARY TRACT ULTRASOUND COMPLETE COMPARISON:  June 02, 2021 FINDINGS: Right Kidney: Renal measurements: 8.4 x 4.3 x 4.4 cm = volume: 84 mL. Echogenicity is mildly increased. No mass or hydronephrosis visualized. Cortical thinning. This is similar in comparison to prior Left Kidney: Renal measurements: 13.1 x 5.8 x 7.2 cm = volume: 288 mL. Echogenicity within the upper  limits of normal. No suspicious mass or hydronephrosis visualized. There is a benign 6 cm cyst noted (for which no dedicated imaging follow-up is recommended). Bladder: Subjectively thickened bladder walls with a trabeculated appearance. Other: None. IMPRESSION: 1. No hydronephrosis. 2. Bladder wall prominence with trabeculation. Findings could reflect sequela of chronic outlet obstruction or infection. Recommend correlation with urine analysis. Electronically Signed   By: Valentino Saxon M.D.   On: 06/24/2022 12:48   DG CHEST PORT 1 VIEW  Result Date: 06/23/2022 CLINICAL DATA:  Shortness of breath EXAM: PORTABLE CHEST 1 VIEW COMPARISON:  06/22/2022 FINDINGS: Moderate cardiomegaly is unchanged. Left axillary surgical clips. No focal airspace consolidation or pulmonary edema. IMPRESSION: Unchanged cardiomegaly. Electronically Signed   By: Ulyses Jarred M.D.   On: 06/23/2022 04:02   DG Chest Port 1 View  Result Date: 06/22/2022 CLINICAL DATA:  10026 Shortness of breath 10026 EXAM: PORTABLE CHEST 1 VIEW COMPARISON:  September 27, 2021 FINDINGS: The cardiomediastinal silhouette is unchanged in contour.LEFT axillary clips. No pleural effusion. No pneumothorax. Similar appearance of chronic interstitial and bronchial markings. No acute pleuroparenchymal abnormality. Visualized abdomen is unremarkable. IMPRESSION: No acute cardiopulmonary abnormality. Electronically Signed   By: Valentino Saxon M.D.   On: 06/22/2022 14:38   DG Pelvis Portable  Result Date: 06/22/2022 CLINICAL DATA:  Intertrochanteric fracture left femur. EXAM: LEFT FEMUR PORTABLE 2 VIEWS; PORTABLE PELVIS 1-2 VIEWS COMPARISON:  Left femur radiographs 06/11/2022 FINDINGS: Redemonstration of long cephalomedullary nail fixation for intertrochanteric left femoral fracture. There is again near anatomic alignment. Mild medial displacement of lesser trochanter is unchanged. Resolution of the prior postoperative subcutaneous air at the lateral  pelvis, lateral proximal thigh, and lateral distal thigh. No perihardware lucency is seen to indicate hardware failure or loosening. Mild medial compartment of the knee a left wrist separate joint space narrowing. IMPRESSION: Status post left femoral intramedullary nail fixation without evidence of hardware failure. Electronically Signed   By: Yvonne Kendall M.D.   On: 06/22/2022 13:05   DG FEMUR PORT MIN 2 VIEWS LEFT  Result Date: 06/22/2022 CLINICAL DATA:  Intertrochanteric fracture left femur. EXAM: LEFT FEMUR PORTABLE 2 VIEWS; PORTABLE PELVIS 1-2 VIEWS COMPARISON:  Left femur radiographs 06/11/2022 FINDINGS: Redemonstration of long cephalomedullary nail fixation for intertrochanteric left femoral fracture. There is again near anatomic alignment. Mild medial displacement of lesser trochanter is unchanged. Resolution of the prior postoperative subcutaneous air at the lateral pelvis, lateral proximal thigh, and lateral distal thigh. No perihardware lucency is seen to indicate hardware failure or loosening. Mild medial compartment of the knee a left wrist separate joint space narrowing. IMPRESSION: Status post left femoral intramedullary nail fixation without evidence of hardware failure. Electronically Signed   By: Yvonne Kendall M.D.   On: 06/22/2022 13:05   CT HIP LEFT WO CONTRAST  Result Date: 06/16/2022 CLINICAL DATA:  Persistent left hip pain. Recent hip fracture and internal fixation. EXAM: CT OF THE LEFT HIP WITHOUT CONTRAST TECHNIQUE: Multidetector CT imaging of the left hip was performed according to the standard protocol. Multiplanar CT image reconstructions were also generated. RADIATION DOSE REDUCTION: This exam was performed according to the departmental dose-optimization program which includes automated exposure control, adjustment of the mA and/or kV according to patient size and/or use of iterative reconstruction technique. COMPARISON:  Radiographs 06/11/2022 FINDINGS: Intramedullary gamma  nail in the femur with a dynamic hip screw transfixing the complex comminuted intertrochanteric fracture. The position and alignment appear good and stable. I do not see any complicating features associated with the hardware. No acetabular fracture. The left pubic bones are intact. The pubic symphysis and left SI joint are intact. No definite sacral fractures. Evidence of a remote/chronic tear of the gluteus minimus muscle which demonstrates fatty atrophy and calcifications. I do not see a large intramuscular hematoma or subcutaneous hematoma. Expected inflammation in the muscles surrounding the hip and some scattered fluid/hematoma. No significant left-sided intrapelvic abnormalities. IMPRESSION: 1. Intramedullary gamma nail in the femur with a dynamic hip screw transfixing the complex comminuted intertrochanteric fracture. The position and alignment appear good and stable. No complicating features associated with the hardware. 2. Expected inflammation in the muscles surrounding the hip and some scattered fluid/hematoma. No large intramuscular hematoma or subcutaneous hematoma. 3. Remote/chronic tear of the gluteus minimus muscle which demonstrates fatty atrophy and calcifications. Electronically Signed   By: Marijo Sanes M.D.   On: 06/16/2022 18:11   DG HIP UNILAT WITH PELVIS 2-3 VIEWS LEFT  Result Date: 06/11/2022 CLINICAL DATA:  ORIF left femur EXAM: DG HIP (WITH OR WITHOUT PELVIS) 2-3V LEFT COMPARISON:  06/09/2022 FINDINGS: Intraoperative images during left proximal femur ORIF for an intertrochanteric fracture. Improved fracture alignment. Hardware is intact. No evidence of immediate complication. IMPRESSION: Intraoperative images during left proximal femur ORIF for an intertrochanteric fracture. Improved fracture alignment. No evidence of immediate complication. Electronically Signed   By: Maurine Simmering M.D.   On: 06/11/2022 15:09   DG Pelvis Portable  Result Date: 06/11/2022 CLINICAL DATA:  Postop  EXAM: LEFT FEMUR 2 VIEWS; PORTABLE PELVIS 1-2 VIEWS COMPARISON:  None Available. FINDINGS: Postoperative changes of cephalomedullary nailing for an intertrochanteric left femur fracture. Improved fracture alignment. Expected soft tissue changes. Intact hardware without evidence of immediate complication. IMPRESSION: Postoperative changes of left proximal femur ORIF. Improved fracture alignment. No evidence of immediate complication. Electronically Signed   By: Maurine Simmering M.D.   On: 06/11/2022 15:08   DG FEMUR MIN 2 VIEWS LEFT  Result Date: 06/11/2022 CLINICAL DATA:  Postop EXAM: LEFT FEMUR 2 VIEWS; PORTABLE PELVIS 1-2 VIEWS COMPARISON:  None Available. FINDINGS: Postoperative changes of cephalomedullary nailing for an intertrochanteric left femur fracture. Improved fracture alignment. Expected soft tissue changes. Intact hardware without evidence of immediate complication. IMPRESSION: Postoperative changes of left proximal femur ORIF. Improved fracture alignment. No evidence of immediate complication. Electronically Signed   By: Maurine Simmering M.D.   On: 06/11/2022 15:08   DG C-Arm 1-60 Min-No Report  Result Date: 06/11/2022 Fluoroscopy was utilized by the requesting physician.  No radiographic interpretation.   DG Hip Unilat W or Wo Pelvis 2-3 Views Left  Result Date: 06/09/2022 CLINICAL DATA:  Recent fall with left hip pain, initial encounter EXAM: DG HIP (WITH OR WITHOUT PELVIS) 3V LEFT COMPARISON:  None Available. FINDINGS: Comminuted left intratrochanteric fracture is noted with mild impaction at the fracture site. No dislocation is noted. The pelvic ring appears intact. No other focal abnormality is noted. IMPRESSION: Comminuted left intratrochanteric fracture. Electronically Signed   By: Inez Catalina M.D.   On: 06/09/2022 18:59     Discharge Exam: Vitals:   06/28/22 2037 06/29/22 0557  BP: 122/64 (!) 125/52  Pulse: 60 (!) 56  Resp: 19 19  Temp: 97.7 F (36.5 C) (!) 97.5 F (36.4 C)   SpO2: 99% 99%   Vitals:   06/28/22 0517 06/28/22 1449 06/28/22 2037 06/29/22 0557  BP: 111/65 123/70 122/64 (!) 125/52  Pulse: (!) 106 (!) 54 60 (!) 56  Resp: '20 12 19 19  ' Temp: 99.2 F (37.3 C) (!) 97.5 F (36.4 C) 97.7 F (36.5 C) (!) 97.5 F (36.4 C)  TempSrc:  Oral Oral Oral  SpO2: 96% 100% 99% 99%  Weight:      Height:        General: Pt is alert, awake, not in acute distress Cardiovascular: RRR, S1/S2 +, no rubs, no gallops Respiratory: CTA bilaterally, no wheezing, no rhonchi Abdominal: Soft, NT, ND, bowel sounds + Extremities: no edema, no cyanosis    The results of significant diagnostics from this hospitalization (including imaging, microbiology, ancillary and laboratory) are listed below for reference.     Microbiology: Recent Results (from the past 240 hour(s))  SARS Coronavirus 2 by RT PCR (hospital order, performed in [facility] hospital lab) *cepheid single result test* Anterior Nasal Swab     Status: None   Collection Time: 06/21/22  9:07 AM   Specimen: Anterior Nasal Swab  Result Value Ref Range Status  SARS Coronavirus 2 by RT PCR NEGATIVE NEGATIVE Final    Comment: (NOTE) SARS-CoV-2 target nucleic acids are NOT DETECTED.  The SARS-CoV-2 RNA is generally detectable in upper and lower respiratory specimens during the acute phase of infection. The lowest concentration of SARS-CoV-2 viral copies this assay can detect is 250 copies / mL. A negative result does not preclude SARS-CoV-2 infection and should not be used as the sole basis for treatment or other patient management decisions.  A negative result may occur with improper specimen collection / handling, submission of specimen other than nasopharyngeal swab, presence of viral mutation(s) within the areas targeted by this assay, and inadequate number of viral copies (<250 copies / mL). A negative result must be combined with clinical observations, patient history, and epidemiological  information.  Fact Sheet for Patients:   https://www.patel.info/  Fact Sheet for Healthcare Providers: https://hall.com/  This test is not yet approved or  cleared by the [place] FDA and has been authorized for detection and/or diagnosis of SARS-CoV-2 by FDA under an Emergency Use Authorization (EUA).  This EUA will remain in effect (meaning this test can be used) for the duration of the COVID-19 declaration under Section 564(b)(1) of the Act, 21 U.S.C. section 360bbb-3(b)(1), unless the authorization is terminated or revoked sooner.  Performed at [facility], [address] [place], [place] [address]   Urine Culture     Status: Abnormal   Collection Time: 06/21/22  8:31 PM   Specimen: Urine, Clean Catch  Result Value Ref Range Status   Specimen Description   Final    URINE, CLEAN CATCH Performed at [facility], [address] [place], [place] [address]    Special Requests   Final    NONE Performed at [facility], [address] [place], [place] [address]    Culture >=100,000 COLONIES/mL ESCHERICHIA COLI (A)  Final   Report Status 06/24/2022 FINAL  Final   Organism ID, Bacteria ESCHERICHIA COLI (A)  Final      Susceptibility   Escherichia coli - MIC*    AMPICILLIN >=32 RESISTANT Resistant     CEFAZOLIN <=4 SENSITIVE Sensitive     CEFEPIME <=0.12 SENSITIVE Sensitive     CEFTRIAXONE <=0.25 SENSITIVE Sensitive     CIPROFLOXACIN <=0.25 SENSITIVE Sensitive     GENTAMICIN <=1 SENSITIVE Sensitive     IMIPENEM <=0.25 SENSITIVE Sensitive     NITROFURANTOIN <=16 SENSITIVE Sensitive     TRIMETH/SULFA <=20 SENSITIVE Sensitive     AMPICILLIN/SULBACTAM >=32 RESISTANT Resistant     PIP/TAZO <=4 SENSITIVE Sensitive     * >=100,000 COLONIES/mL ESCHERICHIA COLI  Culture, blood (Routine X 2) w Reflex to ID Panel     Status: Abnormal   Collection Time: 06/22/22  6:05 PM   Specimen: BLOOD RIGHT HAND  Result Value Ref Range Status    Specimen Description   Final    BLOOD RIGHT HAND Performed at [facility], [address], [place], [place] [address]    Special Requests   Final    BOTTLES DRAWN AEROBIC AND ANAEROBIC Blood Culture adequate volume Performed at [facility] [place], [place] [address]    Culture  Setup Time   Final    GRAM NEGATIVE RODS Gram Stain Report Called to,Read Back By and Verified With: HEARP N. AT [id number] ON 100423 BY THOMPSON S. IN BOTH AEROBIC AND ANAEROBIC BOTTLES CRITICAL RESULT CALLED TO, READ BACK BY AND VERIFIED WITH: PHARMD TINA TALBOTT 06/23/22 0957 BY AB Performed at  [facility], [place] [address], [place], [place] [address]    Culture ESCHERICHIA COLI (A)  Final   Report Status 06/25/2022 FINAL  Final   Organism ID, Bacteria ESCHERICHIA COLI  Final      Susceptibility   Escherichia coli - MIC*    AMPICILLIN >=32 RESISTANT Resistant     CEFAZOLIN <=4 SENSITIVE Sensitive     CEFEPIME <=0.12 SENSITIVE Sensitive     CEFTAZIDIME <=1 SENSITIVE Sensitive     CEFTRIAXONE <=0.25 SENSITIVE Sensitive     CIPROFLOXACIN <=0.25 SENSITIVE Sensitive     GENTAMICIN <=1 SENSITIVE Sensitive     IMIPENEM <=0.25 SENSITIVE Sensitive     TRIMETH/SULFA <=20 SENSITIVE Sensitive     AMPICILLIN/SULBACTAM 16 INTERMEDIATE Intermediate     PIP/TAZO <=4 SENSITIVE Sensitive     * ESCHERICHIA COLI  Culture, blood (Routine X 2) w Reflex to ID Panel     Status: Abnormal   Collection Time: 06/22/22  6:05 PM   Specimen: BLOOD RIGHT HAND  Result Value Ref Range Status   Specimen Description   Final    BLOOD RIGHT HAND Performed at [facility], [address], [place], [place] [address]    Special Requests   Final    BOTTLES DRAWN AEROBIC AND ANAEROBIC Blood Culture adequate volume Performed at [facility], [address], [place], [place] [address]    Culture  Setup Time   Final    GRAM NEGATIVE RODS Gram Stain Report Called to,Read Back By and Verified With: HEARP N. AT [id number] ON [id number] BY  THOMPSON S. IN BOTH AEROBIC AND ANAEROBIC BOTTLES Performed at [facility], [address], [place], [place] [address]    Culture (A)  Final    ESCHERICHIA COLI SUSCEPTIBILITIES PERFORMED ON PREVIOUS CULTURE WITHIN THE LAST 5 DAYS. Performed at [facility], [place] [address], [place],  [address]    Report Status 06/25/2022 FINAL  Final  Blood Culture ID Panel (Reflexed)     Status: Abnormal   Collection Time: 06/22/22  6:05 PM  Result Value Ref Range Status   Enterococcus faecalis NOT DETECTED NOT DETECTED Final   Enterococcus Faecium NOT DETECTED NOT DETECTED Final   Listeria monocytogenes NOT DETECTED NOT DETECTED Final   Staphylococcus species NOT DETECTED NOT DETECTED Final   Staphylococcus aureus (BCID) NOT DETECTED NOT DETECTED Final   Staphylococcus epidermidis NOT DETECTED NOT DETECTED Final   Staphylococcus lugdunensis NOT DETECTED NOT DETECTED Final   Streptococcus species NOT DETECTED NOT DETECTED Final   Streptococcus agalactiae NOT DETECTED NOT DETECTED Final   Streptococcus pneumoniae NOT DETECTED NOT DETECTED Final   Streptococcus pyogenes NOT DETECTED NOT DETECTED Final   A.calcoaceticus-baumannii NOT DETECTED NOT DETECTED Final   Bacteroides fragilis NOT DETECTED NOT DETECTED Final   Enterobacterales DETECTED (A) NOT DETECTED Final    Comment: Enterobacterales represent a large order of gram negative bacteria, not a single organism. CRITICAL RESULT CALLED TO, READ BACK BY AND VERIFIED WITH: PHARMD T. TALBOTT 06/23/22 '@0957'  BY AB    Enterobacter cloacae complex NOT DETECTED NOT DETECTED Final   Escherichia coli DETECTED (A) NOT DETECTED Final    Comment: CRITICAL RESULT CALLED TO, READ BACK BY AND VERIFIED WITH: PHARMD T. TALBOTT 06/23/22 '@0957'  BY AB     Klebsiella aerogenes NOT DETECTED NOT DETECTED Final   Klebsiella oxytoca NOT DETECTED NOT DETECTED Final   Klebsiella pneumoniae NOT DETECTED NOT DETECTED Final   Proteus species NOT DETECTED NOT  DETECTED Final   Salmonella  species NOT DETECTED NOT DETECTED Final   Serratia marcescens NOT DETECTED NOT DETECTED Final   Haemophilus influenzae NOT DETECTED NOT DETECTED Final   Neisseria meningitidis NOT DETECTED NOT DETECTED Final   Pseudomonas aeruginosa NOT DETECTED NOT DETECTED Final   Stenotrophomonas maltophilia NOT DETECTED NOT DETECTED Final   Candida albicans NOT DETECTED NOT DETECTED Final   Candida auris NOT DETECTED NOT DETECTED Final   Candida glabrata NOT DETECTED NOT DETECTED Final   Candida krusei NOT DETECTED NOT DETECTED Final   Candida parapsilosis NOT DETECTED NOT DETECTED Final   Candida tropicalis NOT DETECTED NOT DETECTED Final   Cryptococcus neoformans/gattii NOT DETECTED NOT DETECTED Final   CTX-M ESBL NOT DETECTED NOT DETECTED Final   Carbapenem resistance IMP NOT DETECTED NOT DETECTED Final   Carbapenem resistance KPC NOT DETECTED NOT DETECTED Final   Carbapenem resistance NDM NOT DETECTED NOT DETECTED Final   Carbapenem resist OXA 48 LIKE NOT DETECTED NOT DETECTED Final   Carbapenem resistance VIM NOT DETECTED NOT DETECTED Final    Comment: Performed at [facility], [address] [place], [place] [address]  Culture, blood (Routine X 2) w Reflex to ID Panel     Status: None   Collection Time: 06/23/22 11:05 AM   Specimen: Right Antecubital; Blood  Result Value Ref Range Status   Specimen Description RIGHT ANTECUBITAL  Final   Special Requests   Final    BOTTLES DRAWN AEROBIC AND ANAEROBIC Blood Culture results may not be optimal due to an excessive volume of blood received in culture bottles   Culture   Final    NO GROWTH 5 DAYS Performed at [facility], [address] [place], [place] [address]    Report Status 06/28/2022 FINAL  Final  Culture, blood (Routine X 2) w Reflex to ID Panel     Status: None   Collection Time: 06/23/22 11:05 AM   Specimen: BLOOD RIGHT HAND  Result Value Ref Range Status   Specimen Description BLOOD RIGHT HAND   Final   Special Requests   Final    BOTTLES DRAWN AEROBIC AND ANAEROBIC Blood Culture adequate volume   Culture   Final    NO GROWTH 5 DAYS Performed at [facility], [address], [place], [place] [address]    Report Status 06/28/2022 FINAL  Final  MRSA Next Gen by PCR, Nasal     Status: None   Collection Time: 06/24/22 12:29 AM   Specimen: Nasal Mucosa; Nasal Swab  Result Value Ref Range Status   MRSA by PCR Next Gen NOT DETECTED NOT DETECTED Final    Comment: (NOTE) The GeneXpert MRSA Assay (FDA approved for NASAL specimens only), is one component of a comprehensive MRSA colonization surveillance program. It is not intended to diagnose MRSA infection nor to guide or monitor treatment for MRSA infections. Test performance is not FDA approved in patients less than 84 years old. Performed at [facility], [address] [place], [place] [id number]      Labs: BNP (last 3 results) No results for input(s): "BNP" in the last 8760 hours. Basic Metabolic Panel: Recent Labs  Lab 06/24/22 0303 06/25/22 0341 06/26/22 0900 06/27/22 0800 06/28/22 0440  NA 138 136 135 137 138  K 4.2 4.3 4.6 4.5 4.5  CL 111 112* 107 109 111  CO2 16* 16* 17* 18* 16*  GLUCOSE 126* 97 100* 97 89  BUN 63* 77* 88* 86* 90*  CREATININE 3.20* 3.36* 4.06* 4.65* 5.17*  CALCIUM 7.2*  7.4* 7.5* 7.5* 7.5*  MG  --   --  2.1 2.1 2.3   Liver Function Tests: Recent Labs  Lab 06/23/22 0028 06/24/22 0303 06/25/22 0341 06/26/22 0900 06/27/22 0800  AST 44* 24 18 12* 11*  ALT 53* 38 '29 21 11  ' ALKPHOS 87 75 72 71 67  BILITOT 3.9* 2.1* 1.3* 1.2 1.0  PROT 4.1* 4.2* 4.1* 4.3* 4.2*  ALBUMIN 1.7* 1.6* 1.6* 1.8* 1.8*   No results for input(s): "LIPASE", "AMYLASE" in the last 168 hours. No results for input(s): "AMMONIA" in the last 168 hours. CBC: Recent Labs  Lab 06/22/22 1857 06/23/22 0028 06/24/22 0303 06/25/22 0341 06/26/22 0900 06/27/22 0800 06/28/22 0440  WBC 2.0*   < > 11.6* 8.4 4.5 3.0* 3.1*   NEUTROABS 1.8  --   --   --   --   --   --   HGB 8.4*   < > 7.4* 7.8* 8.5* 7.7* 8.2*  HCT 25.1*   < > 22.7* 23.3* 25.1* 22.8* 24.4*  MCV 94.0   < > 95.8 92.8 93.3 94.6 93.8  PLT 35*   < > 24* 12* 11* 35* 28*   < > = values in this interval not displayed.   Cardiac Enzymes: No results for input(s): "CKTOTAL", "CKMB", "CKMBINDEX", "TROPONINI" in the last 168 hours. BNP: Invalid input(s): "POCBNP" CBG: Recent Labs  Lab 06/27/22 0453 06/27/22 0721 06/28/22 0519 06/28/22 1125 06/29/22 0601  GLUCAP 91 95 90 104* 94   D-Dimer No results for input(s): "DDIMER" in the last 72 hours. Hgb A1c No results for input(s): "HGBA1C" in the last 72 hours. Lipid Profile No results for input(s): "CHOL", "HDL", "LDLCALC", "TRIG", "CHOLHDL", "LDLDIRECT" in the last 72 hours. Thyroid function studies No results for input(s): "TSH", "T4TOTAL", "T3FREE", "THYROIDAB" in the last 72 hours.  Invalid input(s): "FREET3" Anemia work up No results for input(s): "VITAMINB12", "FOLATE", "FERRITIN", "TIBC", "IRON", "RETICCTPCT" in the last 72 hours. Urinalysis    Component Value Date/Time   COLORURINE AMBER (A) 06/22/2022 1515   APPEARANCEUR TURBID (A) 06/22/2022 1515   APPEARANCEUR Clear 06/10/2020 0851   LABSPEC 1.010 06/22/2022 1515   PHURINE 5.0 06/22/2022 1515   GLUCOSEU NEGATIVE 06/22/2022 1515   HGBUR MODERATE (A) 06/22/2022 1515   BILIRUBINUR NEGATIVE 06/22/2022 1515   BILIRUBINUR Negative 06/10/2020 0851   KETONESUR NEGATIVE 06/22/2022 1515   PROTEINUR 100 (A) 06/22/2022 1515   UROBILINOGEN 2.0 (A) 04/09/2020 1050   NITRITE NEGATIVE 06/22/2022 1515   LEUKOCYTESUR LARGE (A) 06/22/2022 1515   Sepsis Labs Recent Labs  Lab 06/25/22 0341 06/26/22 0900 06/27/22 0800 06/28/22 0440  WBC 8.4 4.5 3.0* 3.1*   Microbiology Recent Results (from the past 240 hour(s))  SARS Coronavirus 2 by RT PCR (hospital order, performed in [facility] hospital lab) *cepheid single result test* Anterior  Nasal Swab     Status: None   Collection Time: 06/21/22  9:07 AM   Specimen: Anterior Nasal Swab  Result Value Ref Range Status   SARS Coronavirus 2 by RT PCR NEGATIVE NEGATIVE Final    Comment: (NOTE) SARS-CoV-2 target nucleic acids are NOT DETECTED.  The SARS-CoV-2 RNA is generally detectable in upper and lower respiratory specimens during the acute phase of infection. The lowest concentration of SARS-CoV-2 viral copies this assay can detect is 250 copies / mL. A negative result does not preclude SARS-CoV-2 infection and should not be used as the sole basis for treatment or other patient management decisions.  A negative result may occur  with improper specimen collection / handling, submission of specimen other than nasopharyngeal swab, presence of viral mutation(s) within the areas targeted by this assay, and inadequate number of viral copies (<250 copies / mL). A negative result must be combined with clinical observations, patient history, and epidemiological information.  Fact Sheet for Patients:   https://www.patel.info/  Fact Sheet for Healthcare Providers: https://hall.com/  This test is not yet approved or  cleared by the [place] FDA and has been authorized for detection and/or diagnosis of SARS-CoV-2 by FDA under an Emergency Use Authorization (EUA).  This EUA will remain in effect (meaning this test can be used) for the duration of the COVID-19 declaration under Section 564(b)(1) of the Act, 21 U.S.C. section 360bbb-3(b)(1), unless the authorization is terminated or revoked sooner.  Performed at [facility], [address] [place], [place] [address]   Urine Culture     Status: Abnormal   Collection Time: 06/21/22  8:31 PM   Specimen: Urine, Clean Catch  Result Value Ref Range Status   Specimen Description   Final    URINE, CLEAN CATCH Performed at [facility], [address], [place], [place] [address]    Special  Requests   Final    NONE Performed at [facility], [address] [place], [place] [address]    Culture >=100,000 COLONIES/mL ESCHERICHIA COLI (A)  Final   Report Status 06/24/2022 FINAL  Final   Organism ID, Bacteria ESCHERICHIA COLI (A)  Final      Susceptibility   Escherichia coli - MIC*    AMPICILLIN >=32 RESISTANT Resistant     CEFAZOLIN <=4 SENSITIVE Sensitive     CEFEPIME <=0.12 SENSITIVE Sensitive     CEFTRIAXONE <=0.25 SENSITIVE Sensitive     CIPROFLOXACIN <=0.25 SENSITIVE Sensitive     GENTAMICIN <=1 SENSITIVE Sensitive     IMIPENEM <=0.25 SENSITIVE Sensitive     NITROFURANTOIN <=16 SENSITIVE Sensitive     TRIMETH/SULFA <=20 SENSITIVE Sensitive     AMPICILLIN/SULBACTAM >=32 RESISTANT Resistant     PIP/TAZO <=4 SENSITIVE Sensitive     * >=100,000 COLONIES/mL ESCHERICHIA COLI  Culture, blood (Routine X 2) w Reflex to ID Panel     Status: Abnormal   Collection Time: 06/22/22  6:05 PM   Specimen: BLOOD RIGHT HAND  Result Value Ref Range Status   Specimen Description   Final    BLOOD RIGHT HAND Performed at [facility], [address], [place], [place] [address]    Special Requests   Final    BOTTLES DRAWN AEROBIC AND ANAEROBIC Blood Culture adequate volume Performed at [place] [place], [place] [address]    Culture  Setup Time   Final    GRAM NEGATIVE RODS Gram Stain Report Called to,Read Back By and Verified With: HEARP N. AT [id number] ON [id number] BY THOMPSON S. IN BOTH AEROBIC AND ANAEROBIC BOTTLES CRITICAL RESULT CALLED TO, READ BACK BY AND VERIFIED WITH: PHARMD TINA TALBOTT 06/23/22 0957 BY AB Performed at [facility], [place] [address] [place], [place] [address]    Culture ESCHERICHIA COLI (A)  Final   Report Status 06/25/2022 FINAL  Final   Organism ID, Bacteria ESCHERICHIA COLI  Final      Susceptibility   Escherichia coli - MIC*    AMPICILLIN >=32 RESISTANT Resistant     CEFAZOLIN <=4 SENSITIVE Sensitive     CEFEPIME <=0.12 SENSITIVE  Sensitive     CEFTAZIDIME <=1 SENSITIVE Sensitive     CEFTRIAXONE <=0.25  SENSITIVE Sensitive     CIPROFLOXACIN <=0.25 SENSITIVE Sensitive     GENTAMICIN <=1 SENSITIVE Sensitive     IMIPENEM <=0.25 SENSITIVE Sensitive     TRIMETH/SULFA <=20 SENSITIVE Sensitive     AMPICILLIN/SULBACTAM 16 INTERMEDIATE Intermediate     PIP/TAZO <=4 SENSITIVE Sensitive     * ESCHERICHIA COLI  Culture, blood (Routine X 2) w Reflex to ID Panel     Status: Abnormal   Collection Time: 06/22/22  6:05 PM   Specimen: BLOOD RIGHT HAND  Result Value Ref Range Status   Specimen Description   Final    BLOOD RIGHT HAND Performed at [facility], [address] [place], [place] [address]    Special Requests   Final    BOTTLES DRAWN AEROBIC AND ANAEROBIC Blood Culture adequate volume Performed at [facility], [address], [place], [place] [address]    Culture  Setup Time   Final    GRAM NEGATIVE RODS Gram Stain Report Called to,Read Back By and Verified With: HEARP N. AT [id number] ON [id number] BY THOMPSON S. IN BOTH AEROBIC AND ANAEROBIC BOTTLES Performed at [facility], [address] [place], [place] [address]    Culture (A)  Final    ESCHERICHIA COLI SUSCEPTIBILITIES PERFORMED ON PREVIOUS CULTURE WITHIN THE LAST 5 DAYS. Performed at [facility], [place] [address] [place], [place] [address]    Report Status 06/25/2022 FINAL  Final  Blood Culture ID Panel (Reflexed)     Status: Abnormal   Collection Time: 06/22/22  6:05 PM  Result Value Ref Range Status   Enterococcus faecalis NOT DETECTED NOT DETECTED Final   Enterococcus Faecium NOT DETECTED NOT DETECTED Final   Listeria monocytogenes NOT DETECTED NOT DETECTED Final   Staphylococcus species NOT DETECTED NOT DETECTED Final   Staphylococcus aureus (BCID) NOT DETECTED NOT DETECTED Final   Staphylococcus epidermidis NOT DETECTED NOT DETECTED Final   Staphylococcus lugdunensis NOT DETECTED NOT DETECTED Final   Streptococcus species NOT DETECTED NOT DETECTED  Final   Streptococcus agalactiae NOT DETECTED NOT DETECTED Final   Streptococcus pneumoniae NOT DETECTED NOT DETECTED Final   Streptococcus pyogenes NOT DETECTED NOT DETECTED Final   A.calcoaceticus-baumannii NOT DETECTED NOT DETECTED Final   Bacteroides fragilis NOT DETECTED NOT DETECTED Final   Enterobacterales DETECTED (A) NOT DETECTED Final    Comment: Enterobacterales represent a large order of gram negative bacteria, not a single organism. CRITICAL RESULT CALLED TO, READ BACK BY AND VERIFIED WITH: PHARMD T. TALBOTT 06/23/22 '@0957'  BY AB    Enterobacter cloacae complex NOT DETECTED NOT DETECTED Final   Escherichia coli DETECTED (A) NOT DETECTED Final    Comment: CRITICAL RESULT CALLED TO, READ BACK BY AND VERIFIED WITH: PHARMD T. TALBOTT 06/23/22 '@0957'  BY AB     Klebsiella aerogenes NOT DETECTED NOT DETECTED Final   Klebsiella oxytoca NOT DETECTED NOT DETECTED Final   Klebsiella pneumoniae NOT DETECTED NOT DETECTED Final   Proteus species NOT DETECTED NOT DETECTED Final   Salmonella species NOT DETECTED NOT DETECTED Final   Serratia marcescens NOT DETECTED NOT DETECTED Final   Haemophilus influenzae NOT DETECTED NOT DETECTED Final   Neisseria meningitidis NOT DETECTED NOT DETECTED Final   Pseudomonas aeruginosa NOT DETECTED NOT DETECTED Final   Stenotrophomonas maltophilia NOT DETECTED NOT DETECTED Final   Candida albicans NOT DETECTED NOT DETECTED Final   Candida auris NOT DETECTED NOT DETECTED Final   Candida glabrata NOT DETECTED NOT DETECTED Final   Candida krusei NOT DETECTED NOT DETECTED Final  Candida parapsilosis NOT DETECTED NOT DETECTED Final   Candida tropicalis NOT DETECTED NOT DETECTED Final   Cryptococcus neoformans/gattii NOT DETECTED NOT DETECTED Final   CTX-M ESBL NOT DETECTED NOT DETECTED Final   Carbapenem resistance IMP NOT DETECTED NOT DETECTED Final   Carbapenem resistance KPC NOT DETECTED NOT DETECTED Final   Carbapenem resistance NDM NOT DETECTED NOT  DETECTED Final   Carbapenem resist OXA 48 LIKE NOT DETECTED NOT DETECTED Final   Carbapenem resistance VIM NOT DETECTED NOT DETECTED Final    Comment: Performed at [facility], [place] [address], [place], [place] [address]  Culture, blood (Routine X 2) w Reflex to ID Panel     Status: None   Collection Time: 06/23/22 11:05 AM   Specimen: Right Antecubital; Blood  Result Value Ref Range Status   Specimen Description RIGHT ANTECUBITAL  Final   Special Requests   Final    BOTTLES DRAWN AEROBIC AND ANAEROBIC Blood Culture results may not be optimal due to an excessive volume of blood received in culture bottles   Culture   Final    NO GROWTH 5 DAYS Performed at [facility], [address], [place], [place] [address]    Report Status 06/28/2022 FINAL  Final  Culture, blood (Routine X 2) w Reflex to ID Panel     Status: None   Collection Time: 06/23/22 11:05 AM   Specimen: BLOOD RIGHT HAND  Result Value Ref Range Status   Specimen Description BLOOD RIGHT HAND  Final   Special Requests   Final    BOTTLES DRAWN AEROBIC AND ANAEROBIC Blood Culture adequate volume   Culture   Final    NO GROWTH 5 DAYS Performed at [facility], [address] [place], [place] [address]    Report Status 06/28/2022 FINAL  Final  MRSA Next Gen by PCR, Nasal     Status: None   Collection Time: 06/24/22 12:29 AM   Specimen: Nasal Mucosa; Nasal Swab  Result Value Ref Range Status   MRSA by PCR Next Gen NOT DETECTED NOT DETECTED Final    Comment: (NOTE) The GeneXpert MRSA Assay (FDA approved for NASAL specimens only), is one component of a comprehensive MRSA colonization surveillance program. It is not intended to diagnose MRSA infection nor to guide or monitor treatment for MRSA infections. Test performance is not FDA approved in patients less than 83 years old. Performed at [facility], [address], [place], [place] [address]      Time coordinating discharge: 35 minutes  SIGNED:   Rodena Goldmann, DO [facility] 06/29/2022, 9:06 AM  If 7PM-7AM, please contact night-coverage [email or web address]

## 2022-06-30 ENCOUNTER — Telehealth: Payer: Medicare Other | Admitting: Nurse Practitioner

## 2022-07-03 ENCOUNTER — Encounter: Payer: Self-pay | Admitting: Internal Medicine

## 2022-08-01 ENCOUNTER — Encounter (INDEPENDENT_AMBULATORY_CARE_PROVIDER_SITE_OTHER): Payer: Self-pay | Admitting: Gastroenterology

## 2022-08-09 ENCOUNTER — Encounter (INDEPENDENT_AMBULATORY_CARE_PROVIDER_SITE_OTHER): Payer: Self-pay | Admitting: *Deleted

## 2022-09-01 ENCOUNTER — Telehealth: Payer: Self-pay | Admitting: Internal Medicine

## 2022-09-01 NOTE — Telephone Encounter (Signed)
Crystal advised

## 2022-09-01 NOTE — Telephone Encounter (Signed)
[place], [place], [phone]   Wants to know if the LORazepam (ATIVAN) 0.5 MG tablet  can be increased to 1mg  Q 4hrs PRN?     [employer]

## 2022-09-17 ENCOUNTER — Ambulatory Visit: Payer: Medicare Other | Admitting: Internal Medicine

## 2022-10-06 ENCOUNTER — Other Ambulatory Visit: Payer: Self-pay | Admitting: Internal Medicine

## 2022-10-06 ENCOUNTER — Telehealth: Payer: Self-pay | Admitting: Internal Medicine

## 2022-10-06 DIAGNOSIS — F411 Generalized anxiety disorder: Secondary | ICD-10-CM

## 2022-10-06 MED ORDER — LORAZEPAM 0.5 MG PO TABS: 0.5000 mg | ORAL_TABLET | Freq: Three times a day (TID) | ORAL | 1 refills | Status: AC | PRN

## 2022-10-06 NOTE — Telephone Encounter (Signed)
Prescription Request  10/06/2022  Is this a "Controlled Substance" medicine? No  LOV: Visit date not found  What is the name of the medication or equipment? LORazepam (ATIVAN) 0.5 MG tablet   Have you contacted your pharmacy to request a refill? No   Which pharmacy would you like this sent to?  [facility] [place] [place] - [address] [place] [address] Phone: [phone] Fax: [phone]    Patient notified that their request is being sent to the clinical staff for review and that they should receive a response within 2 business days.   Please advise at [facility] [phone]

## 2022-10-07 NOTE — Telephone Encounter (Signed)
Called patient and per patient spouse under Hospice care at home since 10.20.2022 gave him 2 weeks to live and still under hospice care not able to get out of bed. This medicine was delivered yesterday by [employer]. Unable to do video or in office visit patient not responding.

## 2022-10-14 ENCOUNTER — Telehealth: Payer: Self-pay | Admitting: Cardiology

## 2022-10-14 NOTE — Telephone Encounter (Signed)
Patient wife call to say that patient is on Hospice, he came home from the hospital back in October they gave him 2 days to 2 weeks to live and this past Monday made 15 weeks he is still alive.  They have stopped all of his medication, but she states patient is doing pretty well. She just wanted to make Dr. Harl Bowie aware.

## 2022-10-14 NOTE — Telephone Encounter (Signed)
Appreciate update, and I hope Darius Smith continues to do well despite the severity of his medical issues. We have an appointment in March that we can decide closer to that time about keeping or not depending on his overall health course   Zandra Abts MD

## 2022-10-14 NOTE — Telephone Encounter (Signed)
Notified wife [name]) - she was appreciative of the return call.

## 2022-10-28 ENCOUNTER — Encounter (HOSPITAL_COMMUNITY): Payer: Self-pay | Admitting: *Deleted

## 2022-11-15 ENCOUNTER — Telehealth: Payer: Self-pay | Admitting: Internal Medicine

## 2022-11-15 NOTE — Telephone Encounter (Signed)
Estill Bamberg called from [facility]) [phone] to let provider know the the patient patient passed away yesterday morning 11-21-2022 at 8:50 am.

## 2022-11-19 DEATH — deceased

## 2022-11-29 ENCOUNTER — Ambulatory Visit: Payer: Medicare Other | Admitting: Cardiology
